# Patient Record
Sex: Female | Born: 1955 | Race: White | Hispanic: No | Marital: Married | Smoking: Former smoker
Health system: Southern US, Community
[De-identification: ages and names within clinical notes are randomized; demographics above are authoritative.]

## PROBLEM LIST (undated history)

## (undated) ENCOUNTER — Emergency Department: Discharge status: Absent without leave | Payer: Managed Care, Other (non HMO)

## (undated) DIAGNOSIS — Z9889 Other specified postprocedural states: Secondary | ICD-10-CM

## (undated) DIAGNOSIS — G43909 Migraine, unspecified, not intractable, without status migrainosus: Secondary | ICD-10-CM

## (undated) DIAGNOSIS — K759 Inflammatory liver disease, unspecified: Secondary | ICD-10-CM

## (undated) DIAGNOSIS — I2510 Atherosclerotic heart disease of native coronary artery without angina pectoris: Secondary | ICD-10-CM

## (undated) DIAGNOSIS — F329 Major depressive disorder, single episode, unspecified: Secondary | ICD-10-CM

## (undated) DIAGNOSIS — F32A Depression, unspecified: Secondary | ICD-10-CM

## (undated) DIAGNOSIS — R112 Nausea with vomiting, unspecified: Secondary | ICD-10-CM

## (undated) DIAGNOSIS — I493 Ventricular premature depolarization: Secondary | ICD-10-CM

## (undated) DIAGNOSIS — R519 Headache, unspecified: Secondary | ICD-10-CM

## (undated) DIAGNOSIS — C3490 Malignant neoplasm of unspecified part of unspecified bronchus or lung: Secondary | ICD-10-CM

## (undated) DIAGNOSIS — I471 Supraventricular tachycardia, unspecified: Secondary | ICD-10-CM

## (undated) DIAGNOSIS — J189 Pneumonia, unspecified organism: Secondary | ICD-10-CM

## (undated) DIAGNOSIS — K8020 Calculus of gallbladder without cholecystitis without obstruction: Secondary | ICD-10-CM

## (undated) DIAGNOSIS — K589 Irritable bowel syndrome without diarrhea: Secondary | ICD-10-CM

## (undated) DIAGNOSIS — M797 Fibromyalgia: Secondary | ICD-10-CM

## (undated) DIAGNOSIS — R51 Headache: Secondary | ICD-10-CM

## (undated) DIAGNOSIS — I10 Essential (primary) hypertension: Secondary | ICD-10-CM

## (undated) DIAGNOSIS — N3010 Interstitial cystitis (chronic) without hematuria: Secondary | ICD-10-CM

## (undated) DIAGNOSIS — K635 Polyp of colon: Secondary | ICD-10-CM

## (undated) DIAGNOSIS — I491 Atrial premature depolarization: Secondary | ICD-10-CM

## (undated) DIAGNOSIS — R918 Other nonspecific abnormal finding of lung field: Secondary | ICD-10-CM

## (undated) DIAGNOSIS — E039 Hypothyroidism, unspecified: Secondary | ICD-10-CM

## (undated) DIAGNOSIS — F419 Anxiety disorder, unspecified: Secondary | ICD-10-CM

## (undated) DIAGNOSIS — D649 Anemia, unspecified: Secondary | ICD-10-CM

## (undated) DIAGNOSIS — K900 Celiac disease: Secondary | ICD-10-CM

## (undated) DIAGNOSIS — Z8719 Personal history of other diseases of the digestive system: Secondary | ICD-10-CM

## (undated) DIAGNOSIS — J45909 Unspecified asthma, uncomplicated: Secondary | ICD-10-CM

## (undated) DIAGNOSIS — K219 Gastro-esophageal reflux disease without esophagitis: Secondary | ICD-10-CM

## (undated) DIAGNOSIS — E785 Hyperlipidemia, unspecified: Secondary | ICD-10-CM

## (undated) DIAGNOSIS — M1990 Unspecified osteoarthritis, unspecified site: Secondary | ICD-10-CM

## (undated) DIAGNOSIS — J449 Chronic obstructive pulmonary disease, unspecified: Secondary | ICD-10-CM

## (undated) HISTORY — DX: Calculus of gallbladder without cholecystitis without obstruction: K80.20

## (undated) HISTORY — DX: Atherosclerotic heart disease of native coronary artery without angina pectoris: I25.10

## (undated) HISTORY — DX: Supraventricular tachycardia: I47.1

## (undated) HISTORY — DX: Celiac disease: K90.0

## (undated) HISTORY — DX: Depression, unspecified: F32.A

## (undated) HISTORY — DX: Gastro-esophageal reflux disease without esophagitis: K21.9

## (undated) HISTORY — DX: Interstitial cystitis (chronic) without hematuria: N30.10

## (undated) HISTORY — DX: Polyp of colon: K63.5

## (undated) HISTORY — PX: TUBAL LIGATION: SHX77

## (undated) HISTORY — DX: Chronic obstructive pulmonary disease, unspecified: J44.9

## (undated) HISTORY — DX: Hypothyroidism, unspecified: E03.9

## (undated) HISTORY — PX: CARDIAC CATHETERIZATION: SHX172

## (undated) HISTORY — DX: Supraventricular tachycardia, unspecified: I47.10

## (undated) HISTORY — DX: Anxiety disorder, unspecified: F41.9

## (undated) HISTORY — PX: CHOLECYSTECTOMY: SHX55

## (undated) HISTORY — PX: VAGINAL HYSTERECTOMY: SUR661

## (undated) HISTORY — DX: Irritable bowel syndrome, unspecified: K58.9

## (undated) HISTORY — DX: Malignant neoplasm of unspecified part of unspecified bronchus or lung: C34.90

## (undated) HISTORY — DX: Hyperlipidemia, unspecified: E78.5

## (undated) HISTORY — DX: Atrial premature depolarization: I49.1

## (undated) HISTORY — PX: CORONARY ANGIOPLASTY WITH STENT PLACEMENT: SHX49

## (undated) HISTORY — DX: Major depressive disorder, single episode, unspecified: F32.9

## (undated) HISTORY — DX: Ventricular premature depolarization: I49.3

## (undated) HISTORY — DX: Fibromyalgia: M79.7

---

## 1997-12-31 ENCOUNTER — Ambulatory Visit (HOSPITAL_COMMUNITY): Admission: RE | Admit: 1997-12-31 | Discharge: 1997-12-31 | Payer: Self-pay | Admitting: Family Medicine

## 1997-12-31 ENCOUNTER — Encounter: Payer: Self-pay | Admitting: Family Medicine

## 1998-11-16 ENCOUNTER — Encounter: Payer: Self-pay | Admitting: Family Medicine

## 1998-11-16 ENCOUNTER — Ambulatory Visit (HOSPITAL_COMMUNITY): Admission: RE | Admit: 1998-11-16 | Discharge: 1998-11-16 | Payer: Self-pay | Admitting: Family Medicine

## 1999-04-23 ENCOUNTER — Ambulatory Visit (HOSPITAL_COMMUNITY): Admission: RE | Admit: 1999-04-23 | Discharge: 1999-04-23 | Payer: Self-pay | Admitting: Gastroenterology

## 1999-05-16 ENCOUNTER — Other Ambulatory Visit: Admission: RE | Admit: 1999-05-16 | Discharge: 1999-05-16 | Payer: Self-pay | Admitting: *Deleted

## 1999-10-28 ENCOUNTER — Ambulatory Visit (HOSPITAL_COMMUNITY): Admission: RE | Admit: 1999-10-28 | Discharge: 1999-10-28 | Payer: Self-pay | Admitting: Urology

## 1999-12-15 ENCOUNTER — Encounter: Payer: Self-pay | Admitting: Urology

## 1999-12-17 ENCOUNTER — Observation Stay (HOSPITAL_COMMUNITY): Admission: RE | Admit: 1999-12-17 | Discharge: 1999-12-18 | Payer: Self-pay | Admitting: Urology

## 2001-01-06 ENCOUNTER — Encounter: Payer: Self-pay | Admitting: Family Medicine

## 2001-01-06 ENCOUNTER — Encounter: Admission: RE | Admit: 2001-01-06 | Discharge: 2001-01-06 | Payer: Self-pay | Admitting: Family Medicine

## 2001-11-18 ENCOUNTER — Encounter: Payer: Self-pay | Admitting: General Surgery

## 2001-11-23 ENCOUNTER — Encounter (INDEPENDENT_AMBULATORY_CARE_PROVIDER_SITE_OTHER): Payer: Self-pay | Admitting: *Deleted

## 2001-11-23 ENCOUNTER — Ambulatory Visit (HOSPITAL_COMMUNITY): Admission: RE | Admit: 2001-11-23 | Discharge: 2001-11-25 | Payer: Self-pay | Admitting: General Surgery

## 2001-11-23 ENCOUNTER — Encounter: Payer: Self-pay | Admitting: General Surgery

## 2001-11-26 ENCOUNTER — Emergency Department (HOSPITAL_COMMUNITY): Admission: EM | Admit: 2001-11-26 | Discharge: 2001-11-26 | Payer: Self-pay | Admitting: Emergency Medicine

## 2002-01-06 ENCOUNTER — Ambulatory Visit (HOSPITAL_COMMUNITY): Admission: RE | Admit: 2002-01-06 | Discharge: 2002-01-06 | Payer: Self-pay

## 2002-02-17 ENCOUNTER — Ambulatory Visit: Admission: RE | Admit: 2002-02-17 | Discharge: 2002-02-17 | Payer: Self-pay

## 2003-04-05 ENCOUNTER — Other Ambulatory Visit: Admission: RE | Admit: 2003-04-05 | Discharge: 2003-04-05 | Payer: Self-pay | Admitting: Family Medicine

## 2004-06-08 ENCOUNTER — Ambulatory Visit: Payer: Self-pay | Admitting: Psychiatry

## 2004-06-08 ENCOUNTER — Inpatient Hospital Stay (HOSPITAL_COMMUNITY): Admission: RE | Admit: 2004-06-08 | Discharge: 2004-06-16 | Payer: Self-pay | Admitting: Psychiatry

## 2008-02-01 ENCOUNTER — Encounter: Admission: RE | Admit: 2008-02-01 | Discharge: 2008-02-01 | Payer: Self-pay | Admitting: Family Medicine

## 2008-02-03 ENCOUNTER — Ambulatory Visit: Payer: Self-pay | Admitting: Cardiovascular Disease

## 2008-02-03 ENCOUNTER — Encounter (INDEPENDENT_AMBULATORY_CARE_PROVIDER_SITE_OTHER): Payer: Self-pay | Admitting: Internal Medicine

## 2008-02-03 ENCOUNTER — Observation Stay (HOSPITAL_COMMUNITY): Admission: EM | Admit: 2008-02-03 | Discharge: 2008-02-04 | Payer: Self-pay | Admitting: Emergency Medicine

## 2008-02-04 ENCOUNTER — Ambulatory Visit (HOSPITAL_COMMUNITY): Admission: RE | Admit: 2008-02-04 | Discharge: 2008-02-04 | Payer: Self-pay | Admitting: Internal Medicine

## 2008-02-17 ENCOUNTER — Ambulatory Visit: Payer: Self-pay | Admitting: Cardiovascular Disease

## 2008-05-07 ENCOUNTER — Ambulatory Visit: Payer: Self-pay | Admitting: Cardiovascular Disease

## 2008-05-07 ENCOUNTER — Encounter: Payer: Self-pay | Admitting: Cardiovascular Disease

## 2008-05-07 DIAGNOSIS — R072 Precordial pain: Secondary | ICD-10-CM | POA: Insufficient documentation

## 2008-05-07 LAB — CONVERTED CEMR LAB
BUN: 16 mg/dL (ref 6–23)
Basophils Relative: 0.3 % (ref 0.0–3.0)
CO2: 28 meq/L (ref 19–32)
Chloride: 107 meq/L (ref 96–112)
Creatinine, Ser: 0.6 mg/dL (ref 0.4–1.2)
Eosinophils Absolute: 0.1 10*3/uL (ref 0.0–0.7)
Eosinophils Relative: 1.7 % (ref 0.0–5.0)
Hemoglobin: 13.5 g/dL (ref 12.0–15.0)
Lymphocytes Relative: 26.5 % (ref 12.0–46.0)
MCV: 90.7 fL (ref 78.0–100.0)
Neutro Abs: 5.5 10*3/uL (ref 1.4–7.7)
Neutrophils Relative %: 64.8 % (ref 43.0–77.0)
RBC: 4.3 M/uL (ref 3.87–5.11)
WBC: 8.5 10*3/uL (ref 4.5–10.5)

## 2008-05-10 ENCOUNTER — Ambulatory Visit: Payer: Self-pay | Admitting: Cardiovascular Disease

## 2008-05-10 ENCOUNTER — Inpatient Hospital Stay (HOSPITAL_BASED_OUTPATIENT_CLINIC_OR_DEPARTMENT_OTHER): Admission: RE | Admit: 2008-05-10 | Discharge: 2008-05-10 | Payer: Self-pay | Admitting: Cardiovascular Disease

## 2008-05-15 ENCOUNTER — Ambulatory Visit: Payer: Self-pay | Admitting: Cardiovascular Disease

## 2008-05-15 ENCOUNTER — Ambulatory Visit (HOSPITAL_COMMUNITY): Admission: RE | Admit: 2008-05-15 | Discharge: 2008-05-16 | Payer: Self-pay | Admitting: Cardiovascular Disease

## 2008-05-29 DIAGNOSIS — IMO0001 Reserved for inherently not codable concepts without codable children: Secondary | ICD-10-CM | POA: Insufficient documentation

## 2008-05-29 DIAGNOSIS — J45909 Unspecified asthma, uncomplicated: Secondary | ICD-10-CM | POA: Insufficient documentation

## 2008-05-29 DIAGNOSIS — M479 Spondylosis, unspecified: Secondary | ICD-10-CM | POA: Insufficient documentation

## 2008-05-29 DIAGNOSIS — E785 Hyperlipidemia, unspecified: Secondary | ICD-10-CM | POA: Insufficient documentation

## 2008-05-29 DIAGNOSIS — K589 Irritable bowel syndrome without diarrhea: Secondary | ICD-10-CM | POA: Insufficient documentation

## 2008-05-29 DIAGNOSIS — F3289 Other specified depressive episodes: Secondary | ICD-10-CM | POA: Insufficient documentation

## 2008-05-29 DIAGNOSIS — K219 Gastro-esophageal reflux disease without esophagitis: Secondary | ICD-10-CM | POA: Insufficient documentation

## 2008-05-29 DIAGNOSIS — F329 Major depressive disorder, single episode, unspecified: Secondary | ICD-10-CM

## 2008-06-04 ENCOUNTER — Encounter: Admission: RE | Admit: 2008-06-04 | Discharge: 2008-06-04 | Payer: Self-pay | Admitting: Family Medicine

## 2008-06-04 ENCOUNTER — Encounter: Payer: Self-pay | Admitting: Cardiovascular Disease

## 2008-06-04 ENCOUNTER — Ambulatory Visit: Payer: Self-pay | Admitting: Cardiovascular Disease

## 2008-06-04 DIAGNOSIS — I2510 Atherosclerotic heart disease of native coronary artery without angina pectoris: Secondary | ICD-10-CM | POA: Insufficient documentation

## 2008-06-07 ENCOUNTER — Encounter (HOSPITAL_COMMUNITY): Admission: RE | Admit: 2008-06-07 | Discharge: 2008-09-05 | Payer: Self-pay | Admitting: Cardiovascular Disease

## 2008-06-11 ENCOUNTER — Inpatient Hospital Stay (HOSPITAL_COMMUNITY): Admission: EM | Admit: 2008-06-11 | Discharge: 2008-06-13 | Payer: Self-pay | Admitting: Emergency Medicine

## 2008-06-11 ENCOUNTER — Ambulatory Visit: Payer: Self-pay | Admitting: Cardiology

## 2008-06-12 ENCOUNTER — Telehealth (INDEPENDENT_AMBULATORY_CARE_PROVIDER_SITE_OTHER): Payer: Self-pay | Admitting: *Deleted

## 2008-07-03 ENCOUNTER — Telehealth: Payer: Self-pay | Admitting: Cardiovascular Disease

## 2008-07-16 ENCOUNTER — Ambulatory Visit: Payer: Self-pay | Admitting: Cardiovascular Disease

## 2008-07-26 ENCOUNTER — Encounter: Payer: Self-pay | Admitting: Cardiovascular Disease

## 2008-08-03 ENCOUNTER — Telehealth: Payer: Self-pay | Admitting: Cardiovascular Disease

## 2008-08-10 ENCOUNTER — Telehealth: Payer: Self-pay | Admitting: Cardiovascular Disease

## 2008-09-20 ENCOUNTER — Encounter: Payer: Self-pay | Admitting: Cardiovascular Disease

## 2009-01-23 ENCOUNTER — Encounter: Payer: Self-pay | Admitting: Cardiology

## 2009-01-23 ENCOUNTER — Encounter: Payer: Self-pay | Admitting: Cardiovascular Disease

## 2009-02-06 ENCOUNTER — Emergency Department (HOSPITAL_COMMUNITY): Admission: EM | Admit: 2009-02-06 | Discharge: 2009-02-07 | Payer: Self-pay | Admitting: Emergency Medicine

## 2009-02-13 ENCOUNTER — Telehealth (INDEPENDENT_AMBULATORY_CARE_PROVIDER_SITE_OTHER): Payer: Self-pay | Admitting: *Deleted

## 2009-02-19 ENCOUNTER — Ambulatory Visit: Payer: Self-pay | Admitting: Cardiovascular Disease

## 2009-02-19 DIAGNOSIS — R000 Tachycardia, unspecified: Secondary | ICD-10-CM | POA: Insufficient documentation

## 2009-03-05 ENCOUNTER — Ambulatory Visit: Payer: Self-pay

## 2009-03-15 ENCOUNTER — Encounter: Payer: Self-pay | Admitting: Cardiovascular Disease

## 2009-03-18 ENCOUNTER — Telehealth: Payer: Self-pay | Admitting: Cardiovascular Disease

## 2009-03-18 ENCOUNTER — Ambulatory Visit: Payer: Self-pay | Admitting: Cardiology

## 2009-05-06 ENCOUNTER — Telehealth: Payer: Self-pay | Admitting: Cardiovascular Disease

## 2009-05-14 ENCOUNTER — Ambulatory Visit (HOSPITAL_COMMUNITY): Admission: RE | Admit: 2009-05-14 | Discharge: 2009-05-14 | Payer: Self-pay | Admitting: Gastroenterology

## 2009-05-28 ENCOUNTER — Encounter: Payer: Self-pay | Admitting: Cardiovascular Disease

## 2009-09-25 ENCOUNTER — Encounter: Payer: Self-pay | Admitting: Cardiovascular Disease

## 2009-09-27 ENCOUNTER — Telehealth (INDEPENDENT_AMBULATORY_CARE_PROVIDER_SITE_OTHER): Payer: Self-pay | Admitting: *Deleted

## 2010-01-21 ENCOUNTER — Encounter: Payer: Self-pay | Admitting: Cardiovascular Disease

## 2010-01-21 ENCOUNTER — Ambulatory Visit: Payer: Self-pay | Admitting: Cardiovascular Disease

## 2010-04-01 NOTE — Procedures (Signed)
Summary: summary report  summary report   Imported By: Parks Neptune 03/20/2009 11:30:33  _____________________________________________________________________  External Attachment:    Type:   Image     Comment:   External Document

## 2010-04-01 NOTE — Progress Notes (Signed)
Summary: rx sent to [facility] today..pt aware  Phone Note Outgoing Call Call back at pt   Summary of Call: CMA s/w pt to advised rx's sent to [facility] today and to allow 24-48 hours for fax to be rcv'd before order is processed.. Pt gave verbal understanding. Julaine Hua, CMA  May 06, 2009 4:21 PM

## 2010-04-01 NOTE — Assessment & Plan Note (Signed)
Summary: rov   Visit Type:  rov Primary Provider:  Dr. Agustina Caroli, [place]  CC:  pt c/o leg pain in the morning....  History of Present Illness: 55 yo WF with history of asthma, depression, fibromyalgia, GERD and  CAD who was initially seen in our office 02/17/08 with complaints of chest pain.  Nuclear test showed no ischemia. She continued to have chest pain after discharge so she was sent for a left heart cath on 05/10/08 which showed a 80% stenosis in the mid RCA. Dr. Burt Knack performed her PCI on 05/15/08 and placed a 2.5 x 15 mm Xience drug eluting stent in the mid RCA. She was in cardiac rehab on April 13th and had sinus tachycardia with questionable SVT. She was admitted to to [facility] and underwent cardiac cath with showed patent stent in the RCA and non obstructive disease otherwise. Testing in hospital ruled out MI, thyroid disease and was otherwise normal. Holter in January 2011 with NSR, PACs and PVCs. Earlier this year, she had abnormal LFTs and had a liver biopsy. She was told by Dr. Thana Farr that it was normal. She has been seen in our lipid clinic in january 2011 and has been on therapy excluding statins as she has not tolerated well in the past. Her most recent lipid profile in July 2011 showed totatl cholesterol over 300 and LDL over 200.   She has been feeling ok. No chest pain. Occasionally feels fatigued. She has noticed swollen veins in her legs.   New Orders:     1)  EKG w/ Interpretation (93000)   Current Medications (verified): 1)  Wellbutrin Xl 300 Mg Xr24h-Tab (Bupropion Hcl) .Marland Kitchen.. 1 Tab Qd 2)  Welchol 3.75 Gm Pack (Colesevelam Hcl) .... Dissolve 1 Pack in Water Daily. 3)  Lovaza 1 Gm Caps (Omega-3-Acid Ethyl Esters) .... 2 Tab Bid 4)  Vitamin D 50000 Unit Caps (Ergocalciferol) .... Twice A Week 5)  Protonix 40 Mg Tbec (Pantoprazole Sodium) .Marland Kitchen.. 1 Tab Qd 6)  Aspirin Ec 325 Mg Tbec (Aspirin) .... Take One Tablet By Mouth Daily 7)   Albuterol Sulfate (2.5 Mg/69m) 0.083%  Nebu (Albuterol Sulfate) .... Four Times Daily or Every 6 Hours As Needed 8)  Lorazepam 2 Mg Tabs (Lorazepam) .... Take As Directed Prn 9)  Vitamin D 1000 Unit Tabs (Cholecalciferol) .... Take 1 Tablet By Mouth Once A Day 10)  Antara 130 Mg Caps (Fenofibrate Micronized) .... Take 1 Capsule By Mouth Once A Day 11)  Nitroglycerin 0.4 Mg Subl (Nitroglycerin) .... One Tablet Under Tongue Every 5 Minutes As Needed For Chest Pain---May Repeat Times Three 12)  Flonase 50 Mcg/act Susp (Fluticasone Propionate) .... As Needed 13)  Ventolin Hfa 108 (90 Base) Mcg/act Aers (Albuterol Sulfate) .... As Needed 14)  Plavix 75 Mg Tabs (Clopidogrel Bisulfate) .... One Tablet Once Daily 15)  Metoprolol Tartrate 25 Mg Tabs (Metoprolol Tartrate) .... Take One Tablet By Mouth Twice A Day  Allergies: 1)  ! Cipro 2)  ! Codeine  Past History:  Past Medical History: Reviewed history from 07/16/2008 and no changes required. Asthma Depression Fibromyalgia GERD Irritable bowel syndrome Lumbar degenerative joint disease Hyperlipidemia CAD-s/p Xience drug eluting stent mid RCA 05/15/08. Cath 06/12/08 with patent stent and nonobstructive disease.  Social History: Reviewed history from 05/07/2008 and no changes required. 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  The patient denies malaise, fever, weight gain/loss, vision loss, decreased hearing, hoarseness, chest pain, palpitations, shortness of breath, prolonged cough, wheezing, sleep apnea, coughing up blood, abdominal pain, blood in stool, nausea, vomiting, diarrhea, heartburn, incontinence, blood in urine, muscle weakness, joint pain, leg swelling, rash, skin lesions, headache, fainting, dizziness, depression, anxiety, enlarged lymph nodes, easy bruising or bleeding, and environmental  allergies.    Vital Signs:  Patient profile:   55 year old female Height:      67 inches Weight:      166.8 pounds BMI:     26.22 Pulse rate:   102 / minute Pulse rhythm:   irregular BP sitting:   122 / 80  (left arm) Cuff size:   large  Vitals Entered By: Julaine Hua, CMA (January 21, 2010 10:55 AM)  Physical Exam  General:  General: Well developed, well nourished, NAD Musculoskeletal: Muscle strength 5/5 all ext Psychiatric: Mood and affect normal Neck: No JVD, no carotid bruits, no thyromegaly, no lymphadenopathy. Lungs:Clear bilaterally, no wheezes, rhonci, crackles CV: RRR no murmurs, gallops rubs Abdomen: soft, NT, ND, BS present Extremities: No edema, pulses 2+.    EKG  Procedure date:  01/21/2010  Findings:      Sinus tach, rate 102 bpm.   Impression & Recommendations:  Problem # 1:  CAD, NATIVE VESSEL (ICD-414.01)  Stable. No changes.  Continue current meds. Stop Plavix as it has been 20 months since her stent was placed.   I have given her the name of one of our vein specialists in town for evaluation of her varicose veins.   The following medications were removed from the medication list:    Plavix 75 Mg Tabs (Clopidogrel bisulfate) ..... One tablet once daily Her updated medication list for this problem includes:    Aspirin Ec 325 Mg Tbec (Aspirin) .Marland Kitchen... Take one tablet by mouth daily    Nitroglycerin 0.4 Mg Subl (Nitroglycerin) ..... One tablet under tongue every 5 minutes as needed for chest pain---may repeat times three    Metoprolol Tartrate 25 Mg Tabs (Metoprolol tartrate) .Marland Kitchen... Take one tablet by mouth twice a day  Orders: EKG w/ Interpretation (93000)  Problem # 2:  HYPERLIPIDEMIA-MIXED (ICD-272.4) Poor control. She has been seen in the lipid clinic. Will schedule follow up in lipid clinic.  Goal LDL 70 with CAD. Has not tolerated statins in past.   Her updated medication list for this problem includes:    Welchol 3.75 Gm Pack  (Colesevelam hcl) .Marland Kitchen... Dissolve 1 pack in water daily.    Lovaza 1 Gm Caps (Omega-3-acid ethyl esters) .Marland Kitchen... 2 tab bid    Antara 130 Mg Caps (Fenofibrate micronized) .Marland Kitchen... Take 1 capsule by mouth once a day  Patient Instructions: 1)  Your physician recommends that you schedule a follow-up appointment in: 1 year. 2)  Your physician has recommended you make the following change in your medication: STOP taking PLAVIX.

## 2010-04-01 NOTE — Progress Notes (Signed)
Summary: stop plavix  Phone Note From Other Clinic   Caller: [facility] Summary of Call: pt to have liver biopsy on the 15th. needs to stop plavix 7 days prior. ofc [phone] fax [phone] Initial call taken by: Lorraine Lax,  May 06, 2009 11:01 AM  Follow-up for Phone Call        Dr Angelena Form on vac. all week will discuss w/DOD Kevan Rosebush, RN  May 06, 2009 11:11 AM   Dr Angelena Form addressed Plavix for liver biopsy already in his 02/19/09 ov note, note faxed via [facility] fax  Kevan Rosebush, RN  May 06, 2009 11:13 AM

## 2010-04-01 NOTE — Assessment & Plan Note (Signed)
Summary: new lipid   [name] is seen in lipid clinic today.  She spends part of her time in [name], [place], where her daughter was relocated.  She keeps her 2 grandchildren during 3 weeks off (year-round program) and for long weekends.  She didn't complete cardiac rehab due to her travel.    [name] has a history of elevated LFTs.  Dr. Earlean Shawl is following, and plans liver biopsy in March after 12 months of plavix has been completed for Xience stent placed by M. Burt Knack, MD in 3/10 into mid-RCA.   Lipitor:  elevated LFTs Crestor:  flu-like illness Zocor:  maybe elevated LFTs Tricor:    Other medical history: fibromyalgias which complicate treatment options per patient report.    Lipid Management Provider  Alinda Deem, PharmD, BCPS, CPP  Allergies (verified): 1)  ! Cipro 2)  ! Codeine  Past History:  Past Medical History: Last updated: 07/16/2008 Asthma Depression Fibromyalgia GERD Irritable bowel syndrome Lumbar degenerative joint disease Hyperlipidemia CAD-s/p Xience drug eluting stent mid RCA 05/15/08. Cath 06/12/08 with patent stent and nonobstructive disease.  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 Mother-died at age 58 COPD Father died age 68 lung cancer Remote relatives with CAD  Social History: Reviewed history from May 22, 2008 and no changes required. 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 Dissolve 1 pack in water daily.    Lovaza 1 Gm Caps (Omega-3-acid ethyl esters) .Marland Kitchen... 2 tab bid    Antara 130 Mg Caps (Fenofibrate micronized) .Marland Kitchen... Take 1 capsule by mouth once a day Prescriptions: WELCHOL 3.75 GM PACK (COLESEVELAM HCL) Dissolve 1 pack in water daily.  #90 x 3   Entered by:   Alinda Deem PharmD, BCPS, CPP   Authorized by:   Colin Mulders, MD, [facility]   Signed by:   Alinda Deem PharmD, BCPS, CPP on 03/18/2009   Method used:   Electronically to        [facility] (mail-order)             ,          Ph: [phone]       Fax: [phone]   RxID:   [id number] WELCHOL 3.75 GM PACK (COLESEVELAM HCL) Dissolve 1 pack in water daily.  #30 x 1   Entered by:   Alinda Deem PharmD, BCPS, CPP   Authorized by:   Colin Mulders, MD, [facility]   Signed by:   Alinda Deem PharmD, BCPS, CPP on 03/18/2009   Method used:   Electronically to        [facility] [address] [phone]* (retail)       [address]  [place], [place]  [address]       Ph: [phone] or [phone]       Fax: [phone]   RxID:   [id number]

## 2010-04-01 NOTE — Progress Notes (Signed)
  Phone Note From Other Clinic   Caller: Medical Records Summary of Call: A release form from [facility] was faxed over. I sent the requested records

## 2010-04-01 NOTE — Progress Notes (Signed)
Summary: requests call re: holter monitor finding***LMVM***/al  Phone Note From Other Clinic   Caller: patient  Call For: seen in clinic--monitor follow-up Summary of Call: Requests additional call re: findings of holter monitor completed in 01/2009.  Didn't understand all statements in the previous call.  Thanks. Initial call taken by: Alinda Deem PharmD, BCPS, CPP,  March 18, 2009 3:54 PM  Follow-up for Phone Call        [place], RN, BSN  March 18, 2009 4:04 PM  Reviewed monoter results with pt. She verbalizes understanding Follow-up by: Thompson Grayer, RN, BSN,  March 25, 2009 2:41 PM

## 2010-04-01 NOTE — Letter (Signed)
Summary: [facility]   Imported By: Roddie Mc 06/07/2009 16:12:51  _____________________________________________________________________  External Attachment:    Type:   Image     Comment:   External Document

## 2010-05-23 LAB — CBC
HCT: 40.1 % (ref 36.0–46.0)
Hemoglobin: 14 g/dL (ref 12.0–15.0)
MCHC: 34.8 g/dL (ref 30.0–36.0)
RDW: 13 % (ref 11.5–15.5)

## 2010-06-03 LAB — URINALYSIS, ROUTINE W REFLEX MICROSCOPIC
Glucose, UA: NEGATIVE mg/dL
Leukocytes, UA: NEGATIVE
Nitrite: NEGATIVE
Protein, ur: NEGATIVE mg/dL
Urobilinogen, UA: 0.2 mg/dL (ref 0.0–1.0)

## 2010-06-03 LAB — CBC
HCT: 40 % (ref 36.0–46.0)
Hemoglobin: 13.9 g/dL (ref 12.0–15.0)
MCHC: 34.7 g/dL (ref 30.0–36.0)
MCV: 90.2 fL (ref 78.0–100.0)
RDW: 13.6 % (ref 11.5–15.5)

## 2010-06-03 LAB — COMPREHENSIVE METABOLIC PANEL
Alkaline Phosphatase: 90 U/L (ref 39–117)
BUN: 12 mg/dL (ref 6–23)
Calcium: 9.7 mg/dL (ref 8.4–10.5)
Glucose, Bld: 113 mg/dL — ABNORMAL HIGH (ref 70–99)
Total Protein: 6.9 g/dL (ref 6.0–8.3)

## 2010-06-03 LAB — URINE MICROSCOPIC-ADD ON

## 2010-06-03 LAB — DIFFERENTIAL
Basophils Relative: 0 % (ref 0–1)
Monocytes Relative: 7 % (ref 3–12)
Neutro Abs: 4.9 10*3/uL (ref 1.7–7.7)
Neutrophils Relative %: 63 % (ref 43–77)

## 2010-06-03 LAB — URINE CULTURE

## 2010-06-11 LAB — RAPID URINE DRUG SCREEN, HOSP PERFORMED
Cocaine: NOT DETECTED
Opiates: NOT DETECTED
Tetrahydrocannabinol: NOT DETECTED

## 2010-06-11 LAB — DIFFERENTIAL
Basophils Absolute: 0 10*3/uL (ref 0.0–0.1)
Basophils Relative: 0 % (ref 0–1)
Lymphocytes Relative: 20 % (ref 12–46)
Neutro Abs: 5.5 10*3/uL (ref 1.7–7.7)

## 2010-06-11 LAB — COMPREHENSIVE METABOLIC PANEL
Alkaline Phosphatase: 82 U/L (ref 39–117)
BUN: 14 mg/dL (ref 6–23)
CO2: 22 mEq/L (ref 19–32)
Chloride: 107 mEq/L (ref 96–112)
Creatinine, Ser: 0.69 mg/dL (ref 0.4–1.2)
GFR calc non Af Amer: >60 mL/min (ref >60)
Glucose, Bld: 100 mg/dL — ABNORMAL HIGH (ref 70–99)
Total Bilirubin: 0.7 mg/dL (ref 0.3–1.2)

## 2010-06-11 LAB — CBC
HCT: 40.3 % (ref 36.0–46.0)
Hemoglobin: 14 g/dL (ref 12.0–15.0)
MCV: 90.1 fL (ref 78.0–100.0)
MCV: 90.2 fL (ref 78.0–100.0)
RBC: 4.25 MIL/uL (ref 3.87–5.11)
RBC: 4.47 MIL/uL (ref 3.87–5.11)
WBC: 6.8 10*3/uL (ref 4.0–10.5)
WBC: 7.8 10*3/uL (ref 4.0–10.5)

## 2010-06-11 LAB — CARDIAC PANEL(CRET KIN+CKTOT+MB+TROPI)
CK, MB: 2.5 ng/mL (ref 0.3–4.0)
Relative Index: INVALID (ref 0.0–2.5)
Total CK: 105 U/L (ref 7–177)
Troponin I: <0.01 ng/mL (ref 0.00–0.06)

## 2010-06-11 LAB — D-DIMER, QUANTITATIVE: D-Dimer, Quant: 0.58 ug/mL-FEU — ABNORMAL HIGH (ref 0.00–0.48)

## 2010-06-11 LAB — CK TOTAL AND CKMB (NOT AT ARMC)
Relative Index: INVALID (ref 0.0–2.5)
Total CK: 72 U/L (ref 7–177)

## 2010-06-11 LAB — PROTIME-INR
INR: 1 (ref 0.00–1.49)
Prothrombin Time: 13.1 seconds (ref 11.6–15.2)

## 2010-06-11 LAB — HEPARIN LEVEL (UNFRACTIONATED): Heparin Unfractionated: 0.68 IU/mL (ref 0.30–0.70)

## 2010-06-12 LAB — BASIC METABOLIC PANEL
BUN: 13 mg/dL (ref 6–23)
BUN: 18 mg/dL (ref 6–23)
Chloride: 107 mEq/L (ref 96–112)
GFR calc non Af Amer: >60 mL/min (ref >60)
Glucose, Bld: 74 mg/dL (ref 70–99)
Potassium: 3.9 mEq/L (ref 3.5–5.1)
Potassium: 3.9 mEq/L (ref 3.5–5.1)
Sodium: 141 mEq/L (ref 135–145)

## 2010-06-12 LAB — CBC
HCT: 36.8 % (ref 36.0–46.0)
MCHC: 35.6 g/dL (ref 30.0–36.0)
MCV: 89.3 fL (ref 78.0–100.0)
Platelets: 199 10*3/uL (ref 150–400)
Platelets: 225 10*3/uL (ref 150–400)
RDW: 12.9 % (ref 11.5–15.5)
RDW: 13.3 % (ref 11.5–15.5)

## 2010-06-12 LAB — CARDIAC PANEL(CRET KIN+CKTOT+MB+TROPI)
CK, MB: 8.1 ng/mL — ABNORMAL HIGH (ref 0.3–4.0)
Total CK: 78 U/L (ref 7–177)
Troponin I: 0.29 ng/mL — ABNORMAL HIGH (ref 0.00–0.06)

## 2010-07-03 ENCOUNTER — Other Ambulatory Visit: Payer: Self-pay | Admitting: Surgery

## 2010-07-15 NOTE — Discharge Summary (Signed)
[name], [name]                 ACCOUNT NO.:  [id number]   MEDICAL RECORD NO.:  [id number]          PATIENT TYPE:  INP   LOCATION:  5885                         FACILITY:  [facility]   PHYSICIAN:  Lauree Chandler, MDDATE OF BIRTH:  [birth year]   DATE OF ADMISSION:  06/11/2008  DATE OF DISCHARGE:  06/13/2008                               DISCHARGE SUMMARY   PRIMARY CARDIOLOGIST:  Lauree Chandler, MD   PRIMARY CARE PHYSICIAN:  Dr. Joaquim Lai.   PROCEDURES PERFORMED DURING HOSPITALIZATION:  Cardiac catheterization  completed by Dr. Minus Breeding on June 12, 2008, revealing left main  normal, left anterior descending proximal luminal irregularities, mid  diagonal moderate patent, circumflex atrioventricular groove normal, mid-  obtuse marginal branching normal, right coronary artery large dominant,  proximal long 25%, mid stent widely patent, posterior descending artery  large normal, posterolateral moderate normal, nonobstructive coronary  artery disease likely approximately moderately patent right coronary  artery stent.   FINAL DISCHARGE DIAGNOSES:  1. Sinus tachycardia.  2. Coronary artery disease, status post drug-eluting stent to the mid      right coronary artery secondary to 80% stenosis in March 2010.  3. Asthma  4. Gastroesophageal reflux disease.  5. Fibromyalgia.  6. Depression.   HOSPITAL COURSE:  This is a 55 year old Caucasian female with above-  mentioned diagnoses, who was in her first day of Cardiac Rehab when she  had SVT and sinus tachycardia with heart rate of 120 beats per minute  with associated burning across her chest and pressure.  She was brought  to the emergency room from [place] where she was evaluated and  given IV Lopressor x1 and started on higher dose of metoprolol.  The  patient was admitted to rule out ischemic etiology for sinus  tachycardia.  The patient's TSH was also checked to evaluate for  abnormalities.   TSH was found to be  negative at 1.05.  The patient had a D-dimer, which  was found to be mildly elevated at 0.58.  A CT scan was negative for PE.  The patient also had a urine drug screen, which was positive for  amphetamines and the patient admitted to take Adderall p.r.n. at home.   The patient was seen and examined by Dr. Lauree Chandler on June 13, 2008, and found to be stable.  The patient had no recurrence of  chest pain, shortness of breath, or palpitations.  There was no runs of  SVT on higher dose of Lopressor.  The patient will follow up in the  office with Dr. Lauree Chandler on current medication regimen with  changes in the Lopressor dose from 25 mg b.i.d. to 75 mg b.i.d.  She has  also been advised not to take Adderall.   DISCHARGE LABORATORIES:  Cardiac enzymes were found to be negative x3  with a troponin of less than 0.01,  Hemoglobin 13.5, hematocrit 38.3,  white blood cells 6.8, and platelets 212.  TSH 1.092.  Sodium 139,  potassium 4.0, chloride 107, CO2 of 22, glucose 100, BUN 14, and  creatinine 0.69.  Bili total 0.7, alkaline  phosphatase 82, SGOT 20, SGPT  19, total protein 7.0, and calcium 9.2.  D-dimer 0.58.  CT scan of the  chest dated June 12, 2008, revealed no evidence of acute pulmonary  embolism, no thoracic aortic abnormality, 2 small patchy opacities in  the superior segment of the left lower lobe may represent scarring, but  early foci of pneumonia cannot be excluded.  Chest x-ray on June 11, 2008, revealing no active disease.  EKG on discharge revealing normal  sinus rhythm with a ventricular rate of 66 beats per minute without  evidence of ischemia.   VITAL SIGNS ON DISCHARGE:  Blood pressure 90/56, pulse 71, respirations  12, O2 sat 99% on room air, and temperature 97.2.   DISCHARGE MEDICATIONS:  1. Lopressor 75 mg twice a day (new dose increased from 25 b.i.d.).  2. Aspirin 325 daily.  3. Plavix 75 mg daily.  4. Wellbutrin 300 mg daily.  5. Lovaza 1  g twice a day.  6. Protonix 40 mg daily.  7. Avapro 300 mg daily.  8. WelChol 625 mg 3 times a day.  9. Albuterol inhaler 1 puff as needed.  10.Nitroglycerin 0.4 mg sublingual p.r.n. chest discomfort.  11.Vitamin D daily.  12.Antara 130 mg daily.  13.Flomax 1 puff p.r.n.  14.Darvocet 100/650 p.r.n. pain.   ALLERGIES:  CIPRO causing hives and CODEINE causing nausea.   FOLLOWUP PLANS AND APPOINTMENT:  1. The patient will follow up with Dr. Lauree Chandler in his      office in 2-3 weeks.  2. The patient will follow up with primary care physician on her own      accord.  She will make that appointment.  3. The patient has been given post cardiac catheterization      instructions with particular emphasis on the right groin site for      evidence of bleeding, hematoma, or infection.  4. The patient has been advised not to take Adderall in the setting of      recurrent SVT.  5. The patient has been advised to bring medications to follow up      appointments.   TIME SPENT WITH THE PATIENT TO INCLUDE PHYSICIAN TIME:  35 minutes.     Phill Myron. Purcell Nails, NP      Lauree Chandler, MD  Electronically Signed   KML/MEDQ  D:  06/13/2008  T:  06/13/2008  Job:  [id number]   cc:   Dr. Joaquim Lai

## 2010-07-15 NOTE — Discharge Summary (Signed)
[name], [name]                 ACCOUNT NO.:  [id number]   MEDICAL RECORD NO.:  [id number]          PATIENT TYPE:  OBV   LOCATION:  1422                         [facility]   PHYSICIAN:  Jimmey Ralph, MD  DATE OF BIRTH:  1955-04-10   DATE OF ADMISSION:  02/03/2008  DATE OF DISCHARGE:                               DISCHARGE SUMMARY   DISCHARGE DIAGNOSES:  1. Atypical chest pain to rule out myocardial infarction.  2. Chronic obstructive pulmonary disease.  3. Depression.  4. History of suicidal ideation.  5. Fibromyalgia.  6. Gastroesophageal reflux disease.  7. Irritable bowel syndrome.  8. Lumbar degenerative joint disease.  9. Hyperlipidemia.  10.History of transient ischemic attack.   DISCHARGE MEDICATIONS:  1. Aspirin 81 mg p.o. daily.  2. Wellbutrin 300 mg p.o. daily.  3. Colesevelam (Welchol) 625 mg p.o. b.i.d.  4. Fenofibrate 145 mg p.o. daily.  5. Lopressor 25 mg p.o. q.12 hours.  6. Lovaza 1 gram p.o. b.i.d.  7. Protonix 40 mg p.o. q.12 hours.  8. Dialysate one tablet p.o. b.i.d.   HOSPITAL COURSE:  The patient is a 55 year old Caucasian lady patient  who came in and was admitted on December 4 for complaints of chest pain,  squeezing type, 6/10 in intensity on left lower sternal region radiating  to the left shoulder lasting for 1-3 minutes which later on felt like as  if it was stabbing in nature.  The patient had 3 sets of cardiac enzymes  done, one of the cardiac enzymes was borderline high at 0.09.  Cardiology evaluation was done.  The patient underwent a stress test for  the same.  As per the cardiology evaluation and repeat cardiac enzymes  were negative, stress test done which was normal with an EF of 59% and  no ischemia or scarring.  The patient was stable to be discharged and  can be discharged home.  Radiological investigation done during the stay  in the hospital; chest x-ray done on February 03, 2008.  Impression COPD  in upper lobes, no  active cardiopulmonary disease.  CT of the thoracic  spine February 03, 2008.  Impression no acute findings to explain left  upper extremity symptoms, emphysema.  Stress test done on February 04, 2008, normal exam with EF of 59%.  No evidence of ischemia or scarring.   DISCHARGE LABS:  Total WBC 7.4, hemoglobin 13, hematocrit 37.8, platelet  count 327, sodium 144, potassium 4.2, chloride 108, bicarb 28, glucose  102, BUN 12, creatinine 0.7, calcium 9.3.  Three sets of cardiac enzymes  have  been negative.  Total cholesterol has been 267, triglycerides 685, HDL  26, LDL unable to be calculated, as triglycerides more than 400.   PLAN:  To discharge the patient home on medications as dictated above.  The patient was advised to quit smoking.      Jimmey Ralph, MD  Electronically Signed     MP/MEDQ  D:  02/04/2008  T:  02/04/2008  Job:  [id number]

## 2010-07-15 NOTE — Cardiovascular Report (Signed)
NAMES[place]YA, [name]                 ACCOUNT NO.:  [id number]   MEDICAL RECORD NO.:  [id number]          PATIENT TYPE:  INP   LOCATION:  7903                         FACILITY:  [facility]   PHYSICIAN:  Minus Breeding, MD, FACCDATE OF BIRTH:  1955/05/22   DATE OF PROCEDURE:  06/12/2008  DATE OF DISCHARGE:                            CARDIAC CATHETERIZATION   PROCEDURE:  Coronary angiography.   INDICATIONS:  Evaluate patient with chest pain and recent stenting of a  right coronary lesion.   PROCEDURE NOTE:  Left heart catheterization was performed via the left  femoral artery.  The vessel was cannulated using anterior wall puncture.  I did attempt through the right femoral approach but was unable to  cannulate the vessel.  Left coronary and right coronary Judkins  catheters were used.  The patient tolerated the procedure well and left  lab in stable condition.   RESULTS:  Hemodynamics:  AO 117/66.  Coronaries:  Left main was normal.  The LAD had proximal luminal irregularities.  There was mid diagonal  which was moderate sized and patent.  The circumflex in the AV groove  was normal.  Mid obtuse marginal was branching and normal.  The right  coronary artery was a large dominant vessel.  It had proximal long 25%  stenosis.  There was a mid stent which was widely patent.  PDA was large  and normal.  Posterolateral was moderate sized and normal.  LV:  The LV  was not injected.  I did not cross her pressures.   CONCLUSION:  Nonobstructive coronary artery disease.  Widely patent  right coronary artery stent.   PLAN:  The patient will have a CT scan to rule out pulmonary embolism.  She will continue with aggressive risk reduction.      Minus Breeding, MD, [facility]  Electronically Signed     JH/MEDQ  D:  06/12/2008  T:  06/13/2008  Job:  [id number]   cc:   Agustina Caroli, MD

## 2010-07-15 NOTE — H&P (Signed)
[name], [name]                 ACCOUNT NO.:  [id number]   MEDICAL RECORD NO.:  [id number]          PATIENT TYPE:  OBV   LOCATION:  1422                         FACILITY:  [facility]   PHYSICIAN:  Lauree Chandler, MDDATE OF BIRTH:  [birth year]   DATE OF ADMISSION:  02/03/2008  DATE OF DISCHARGE:                              HISTORY & PHYSICAL   CHIEF COMPLAINT:  Chest pain.   HISTORY OF PRESENT ILLNESS:  The patient has noted an increasing dyspnea  over the last several months, but no paroxysmal nocturnal dyspnea or  orthopnea or edema.  Two months ago, the patient noted a more severe  chest pressure or pain.  It was squeezing.  It was 6/10 in severity and  the pain was located in the low left sternal region.  It radiates to the  left shoulder.  It lasted between 1 and 3 minutes.  It came on after  vacuuming and mopping the floor, resolved with rest, but pain moved to  the hands.  The pain in the hands was 10/10, it felt like a stabbing.  The patient felt short of breath during the event and for 1 to 2 hours  afterwards.  The same thing occurred last night, but the pain only went  to the left hand.  No other significant associated symptoms.  No nausea,  vomiting, and dizziness.  She felt warm, but no diaphoresis.  No  palpitations.   PAST MEDICAL HISTORY:  1. Atypical chest pain for which she had a stress test 11 years ago.  2. Asthma.  3. Depression, history of suicidal ideation.  4. Fibromyalgia.  5. Gastroesophageal reflux disorder.  6. Irritable bowel syndrome.  7. Lumbar degenerative joint disease.  8. Hyperlipidemia.  9. Possible TIA.  However, she had carotid study that was normal.   SURGICAL HISTORY:  She is status post hysterectomy.  She is status post  cholecystectomy.   SOCIAL HISTORY:  The patient [place] in [place], [place]  to [place].  She lives with her husband and her son.  She is not  working at this time.  However, she is active doing  chores around the  house.  She has remote tobacco history of approximately 20-pack years.  She never drinks alcohol and she has no illicit drug use.  Her diet is  only different from regular because she eats very little meat.  No  regular exercise.   FAMILY HISTORY:  Mother deceased at age 73 from emphysema.  Father  deceased at age 42 from lung cancer.  Siblings, brother died at 71 from  drug use.  No other known half-siblings and unknown cardiac disease  within her siblings.   REVIEW OF SYSTEMS:  The patient has chronic off and on sweats both at  night and in the day, but no change in the last several months.  She has  had increasing dyspnea on exertion as mentioned in the HPI over the last  couple of months.  She has a non productive cough times last 3 weeks.  She has chronic depression and anxiety and  for the last several months,  she has had more tingling in hands and feet.  Also, she has pain in her  knees, legs, and hands which is chronic.  She has chronic diarrhea.  Her  GERD symptoms have been worse over the last several months, but all  other systems reviewed were negative.   ALLERGIES:  The patient is allergic to CIPROFLOXACIN.   MEDICATIONS:  1. Lovaza 2 g b.i.d.  2. Antara 30 mg by mouth daily.  3. WelChol 625 by mouth b.i.d.  4. Darvocet 180 mg by mouth b.i.d.  5. Lorazepam 2 mg by mouth q.6 h. p.r.n. for anxiety.  6. Wellbutrin 300 by mouth daily.  7. Protonix 40 mg by mouth daily.  8. Adderall 20 mg by mouth p.r.n.  9. Albuterol MDI p.r.n.  In the Emergency Department today, she was      given aspirin 325 mg.   PHYSICAL EXAMINATION:  VITAL SIGNS:  98.0 degrees Fahrenheit, pulse was  78, respiration rate was 16, blood pressure was 128/82.  However, when  she first came in, it was 133/102, which the patient says is much higher  than she has ever had before.  GENERAL:  She is alert and oriented x3, in no apparent distress.  HEENT:  Normocephalic and atraumatic.   Pupils are equal, round, and  reactive to light.  Extraocular muscles are intact.  NECK:  Supple with only mild lymphadenopathy and mild tenderness to her  cervical lymph nodes.  The patient had no thyromegaly, no bruits, and no  JVD.  HEART:  Regular rate and rhythm with an audible S1, S2 without murmurs,  rubs, or gallops.  Normal PMI.  Pulses are 2+ and equal without bruits  in the upper and lower extremities.  LUNGS:  Clear to auscultation bilaterally with no audible wheezing.  SKIN:  No rashes, lesions, or petechiae.  ABDOMEN:  Soft and nontender.  She had tenderness in all four quadrants,  but it was mild.  She had mild hepatomegaly.  She has minimal  enlargement, but there were no tenderness.  EXTREMITIES:  No clubbing, cyanosis, or edema.  MUSCULOSKELETAL:  There was no joint swelling or tenderness, no CVA  tenderness as well.  NEURO:  Cranial nerves II through XII were grossly intact.  Strength was  5/5 in all extremities.  Axial groups, normal sensation throughout.  Normal cerebellar function.   She had a chest x-ray of one view, which showed COPD especially in the  upper lobes, however, no active cardiopulmonary disease.   She had a normal EKG.  Normal sinus rate and rhythm.   LABORATORY DATA:  White blood cell 7.4, hemoglobin 13.0, hematocrit  37.8, platelet count was 227, sodium was 144, potassium was 4.2,  chloride was 108, CO2 was 28, BUN was 12, creatinine was 0.75, glucose  was 102, calcium was 9.3.  First set of cardiac enzymes was negative, CK-  MB was 1.0, troponin I was less than 0.05.   ASSESSMENT AND PLAN:  The patient has chest pain that is atypical with  typical features.   PLAN:  Plan for her is to cycle her cardiac enzymes.  We are going to  admit her to tele.  She will be planned to have an adenosine stress test  in the a.m.  If that stress test is negative, she will follow up with  the appointment that has already been scheduled with our office.  If  it  is positive, we will plan for  a left heart catheterization with possible  PCI.      Guss Bunde, [facility]      Lauree Chandler, MD  Electronically Signed    MS/MEDQ  D:  02/03/2008  T:  02/04/2008  Job:  [id number]

## 2010-07-15 NOTE — H&P (Signed)
[name], [name]                 ACCOUNT NO.:  [id number]   MEDICAL RECORD NO.:  [id number]          PATIENT TYPE:  INP   LOCATION:  3735                         FACILITY:  [facility]   PHYSICIAN:  Bruce R. Olevia Perches, MD, FACCDATE OF BIRTH:  03-25-1955   DATE OF ADMISSION:  06/11/2008  DATE OF DISCHARGE:                              HISTORY & PHYSICAL   PRIMARY CARDIOLOGIST:  Lauree Chandler, MD   PRIMARY CARE PHYSICIAN:  Dr. Joaquim Lai.   REASON FOR ADMISSION:  SVT with chest pain.   HISTORY OF PRESENT ILLNESS:  A 55 year old Caucasian female with known  history of CAD status post drug-eluting stent to the right coronary  artery on May 09, 2008 who was in her first day of cardiac rehab when  she had SVT during cool down with a heart rate of 120 beats per minute  with associated burning across her chest and pressure.  The patient was  given sublingual nitroglycerin with some relief.  She complained of  chest pressure and shortness of breath also over the weekend while she  was chasing a new puppy they have.  The patient was seen by Dr. Angelena Form  on June 04, 2008 and expressed complaints of chest pain but no changes  were made in her medications.  It was noted that during recent  admission, the patient did have a brief episode of SVT in the hospital  which was asymptomatic.  The patient was given reassurance during a  visit to Dr. Camillia Herter office and was to followup.  The patient was  placed on metoprolol 25 mg b.i.d. on discharge and the patient states  she has been taking her medications without skipping doses.  The patient  is currently resting in the ER with a heart rate of 110 and a blood  pressure of 124/82.   REVIEW OF SYSTEMS:  Positive for dizziness, chest pain, shortness of  breath, dyspnea on exertion, and burning in her chest.  All other  systems were reviewed and found to be negative.   PAST MEDICAL HISTORY:  CAD status post cardiac catheterization completed  in March  2010 revealing an 80% lesion in her right coronary artery  midsection.  The patient had a Xience 2.5 x 15 mm drug-eluting stent  placed.  The patient also has a history of asthma, depression,  fibromyalgia, hyperlipidemia, IBS, and GERD.   PAST SURGICAL HISTORY:  Hysterectomy, cholecystectomy, and bladder  surgery.   SOCIAL HISTORY:  She lives in [place] with her husband.  She is a  20 pack-year smoker.  Negative for EtOH or drug use.   FAMILY HISTORY:  Mother deceased from complications of emphysema.  Father deceased from lung cancer.  Brother deceased from drug use.   CURRENT MEDICATIONS:  1. Lopressor 25 mg b.i.d.  2. Aspirin 325 daily.  3. Plavix 75 mg daily.  4. Wellbutrin 300 mg daily.  5. Lovaza 1 gram b.i.d.  6. Darvocet p.r.n.  7. Vitamin D weekly.  8. Protonix 40 mg daily.  9. Avapro 300 mg daily.  10.Lorazepam 2 mg p.r.n.  11.Albuterol q.i.d. p.r.n.  ALLERGIES:  CIPRO, CODEINE, and intolerance to STATINS secondary to  elevated liver enzymes.   LABORATORY DATA:  Current labs are pending.  EKG reveals sinus  tachycardia, ventricular rate of 119 beats per minute.   PHYSICAL EXAMINATION:  VITAL SIGNS:  Blood pressure 122/82, heart rate  113-120, temperature 98.1, and O2 sat 100% on 2 L.  HEENT:  Head is normocephalic and atraumatic.  EYES:  PERRLA.  ENT:  Mucous membranes:  Mouth pink and moist.  Tongue is midline.  NECK:  Supple.  No JVD.  No carotid bruits.  CARDIOVASCULAR:  Tachycardic without murmurs, rubs, or gallops.  Pulses  are 2+ and equal without bruits.  LUNGS:  Clear to auscultation without wheezes, rales, or rhonchi.  ABDOMEN:  Soft, nontender, 2+ bowel sounds without rebound or guarding.  EXTREMITIES:  Without clubbing, cyanosis or edema.  NEUROLOGIC:  Cranial nerves II-XII are grossly intact.   IMPRESSION:  1. Sinus tachycardia.  2. Coronary artery disease status post drug-eluting stent to the mid      right coronary artery secondary to 80%  stenosis in March 2010.  3. Asthma.  4. Gastroesophageal reflux disease.  5. Fibromyalgia.  6. Depression.   PLAN:  This is a 55 year old Caucasian female with known history of CAD  who was recently discharged in March 2010 after having had a drug-  eluting stent to the mid right coronary artery who was in her first day  of cardiac rehab when she had an episode of SVT during cool down with a  heart rate of 120 beats per minute and associated chest burning and  pressure.   The patient was seen and examined by myself and Dr. Eustace Quail after  being brought to the emergency room secondary to the symptoms.  She was  given nitroglycerin and has had some symptom relief.  The patient will  be admitted for observation.  We will increase her beta blocker to 50 mg  p.o. q.8 h. after giving one dose of IV Lopressor in the emergency room.  We will check a TSH.  Return her to the medication she was on previously  to admission with the exception of the Avapro which we will hold  secondary to a low blood pressure at this time.  The  patient will also have a TSH drawn.  Urine drug screen is also going to  be completed.  The patient may or may not be scheduled for cardiac  catheterization in the a.m.  Dr. Olevia Perches will be discussing this with Dr.  Angelena Form and more recommendations will be made.      Phill Myron. Purcell Nails, NP      Sheridan Olevia Perches, MD, [facility]  Electronically Signed    KML/MEDQ  D:  06/11/2008  T:  06/12/2008  Job:  [id number]   cc:   Dr. Joaquim Lai

## 2010-07-15 NOTE — Assessment & Plan Note (Signed)
[name]                            CARDIOLOGY OFFICE NOTE   Diana, [name]                        MRN:          [id number]  DATE:02/17/2008                            DOB:          September 07, 1955    REASON FOR VISIT:  Hospital followup after an admission for chest pain.   HISTORY OF PRESENT ILLNESS:  [name] is a pleasant 55 year old  Caucasian female with past medical history significant for asthma,  depression, fibromyalgia, gastroesophageal reflux disease, irritable  bowel syndrome, and hyperlipidemia who comes in today for a hospital  followup.  The patient was admitted to [facility] on February 03, 2008, after an episode of chest discomfort.  She ruled out for a  myocardial infarction with serial cardiac enzymes and had a nuclear  stress test that showed no evidence of ischemia.  Her resting  echocardiogram during the hospitalization also showed normal left  ventricular function with no significant abnormalities.  There were no  regional wall motion abnormalities noted.  The patient has done well  since her time of discharge and only notes one other episode of chest  discomfort while vacuuming.  This episode of discomfort was different in  nature from the chest pain that occurred prior to her admission to the  hospital.  She tells me today that she had been doing well, but seems to  have contracted an upper respiratory infection.  She denies any fever,  chills, but does endorse mild production of clear sputum.  She otherwise  has had no episodes of near syncope, syncope.  She denies any orthopnea  or lower extremity edema.   PAST MEDICAL HISTORY:  1. Asthma.  2. Depression.  3. Fibromyalgia.  4. Gastroesophageal reflux disease.  5. Atypical chest pain.  6. Irritable bowel syndrome.  7. Lumbar degenerative joint disease.  8. Hyperlipidemia.   PAST SURGICAL HISTORY:  1. Hysterectomy.  2. Cholecystectomy.  3. Bladder  surgery.   ALLERGIES:  CIPROFLOXACIN.   CURRENT MEDICATIONS:  1. Wellbutrin 300 mg once daily.  2. WelChol 625 mg twice daily.  3. Lovaza 1 g twice daily.  4. Darvocet twice daily.  5. Vitamin D 50,000 international units once weekly.  6. Protonix 40 mg once daily.  7. Avapro 300 mg once daily.  8. Enteric-coated aspirin 325 mg once daily.  9. Albuterol inhaler p.r.n.   SOCIAL HISTORY:  The patient lives in [place] with her husband and  her son.  She has four children.  She smoked a half-a-pack of cigarettes  per day for the last 30 years.  She is not currently smoking.  She  denies the use of alcohol or drugs.  Her total pack years of tobacco use  is approximately 20 pack years.  She does not get regular exercise.  She  is currently a housewife and recently received her Bachelors degree in  [place].   FAMILY HISTORY:  The patient's mother died at age 17 from emphysema and  her father died at age 52 from lung cancer.  Her brother died at age  38  from drug use.  She has no family history of premature coronary artery  disease.   REVIEW OF SYSTEMS:  As stated in history of present illness is otherwise  negative.   PHYSICAL EXAMINATION:  VITAL SIGNS:  Blood pressure 100/60, pulse 83,  and regular, respirations 12 and unlabored.  GENERAL:  She is a pleasant young Caucasian female in no acute distress.  She is alert and oriented x3.  SKIN:  Warm and dry.  HEENT:  Normal.  NECK:  No JVD.  No carotid bruits.  No thyromegaly.  No lymphadenopathy.  LUNGS:  There are scattered wheezes with decreased air movement  throughout the bilateral lung fields.  CARDIOVASCULAR:  Regular rate and rhythm without murmurs, gallops, or  rubs noted.  ABDOMEN:  Soft, nontender.  Bowel sounds are present.  EXTREMITIES:  No evidence of edema.  Pulses are 2+ in all extremities.   DIAGNOSTIC STUDIES:  1. Transthoracic echocardiogram obtained on February 03, 2008, shows       normal left ventricular systolic function with ejection fraction of      60%.  There were no left ventricular regional wall motions noted.      There was a small pericardial effusion anterior to the heart, which      most likely represented a prominent epicardial fat pad.  There were      no significant valvular abnormalities noted.  2. Adenosine myocardial perfusion study performed on February 04, 2008,      showed no evidence of myocardial ischemia.  Ejection fraction is      estimated at 59%.  3. A 12-lead EKG obtained in our office today shows normal sinus      rhythm with a ventricular rate of 83 beats per minute in a      rightward axis.   ASSESSMENT AND PLAN:  This is a pleasant 55 year old Caucasian female  whose risk factors for coronary artery disease include hyperlipidemia  and tobacco abuse who presents today for hospital followup after an  admission for chest pain.  The patient had a completely normal  myocardial perfusion study and a normal echocardiogram.  Her  electrocardiogram is not suggestive of any significant abnormalities.  At this time, I do not think any further cardiac workup is necessary.  The patient's chest pain is most likely noncardiac in etiology.  I have  encouraged her to continue to follow with her primary care physician to  look for other causes of chest pain.  I have also told the patient that  if in the future she continues to have exertional chest pain and no  other causes are found, we could elect to perform a diagnostic left  heart catheterization to ultimately rule out any underlying coronary  artery disease.  She is aware that she should call our office if she  continues to have chest pain that cannot be explained by any other  causes.     Lauree Chandler, MD  Electronically Signed    CM/MedQ  DD: 02/17/2008  DT: 02/18/2008  Job #: [id number]

## 2010-07-15 NOTE — Cardiovascular Report (Signed)
[name], [name]                 ACCOUNT NO.:  [id number]   MEDICAL RECORD NO.:  [id number]          PATIENT TYPE:  OIB   LOCATION:  1961                         FACILITY:  [facility]   PHYSICIAN:  Lauree Chandler, MDDATE OF BIRTH:  12-25-1955   DATE OF PROCEDURE:  05/10/2008  DATE OF DISCHARGE:  05/10/2008                            CARDIAC CATHETERIZATION   PRIMARY CARDIOLOGIST:  Lauree Chandler, MD   PROCEDURE PERFORMED:  1. Left heart catheterization.  2. Selective coronary angiography.  3. Left ventricular angiogram.   OPERATOR:  Lauree Chandler, MD   INDICATIONS:  Chest pain in a 55 year old Caucasian female with a past  medical history significant for hyperlipidemia and a strong family  history of premature coronary artery disease.   DETAILS OF PROCEDURE:  The patient was brought to the outpatient cardiac  catheterization laboratory after signing informed consent for the  procedure.  The right groin was prepped and draped in a sterile fashion.  Lidocaine 1% was used for local anesthesia.  A 4-French sheath was  inserted into the right femoral artery without difficulty.  Standard  diagnostic catheters were used to perform selective coronary  angiography.  A 4-French pigtail catheter was used to perform a left  ventricular angiogram in the RAO projection.  There was no significant  gradient across the aortic valve during pullback of the catheter.  The  patient tolerated the procedure well and was taken to the holding area  in stable condition.   ANGIOGRAPHIC FINDINGS:  1. The left main coronary artery had no evidence of obstructive      disease.  2. The left anterior descending coronary artery had a 20% stenosis in      the proximal portion and no other disease through the remainder of      the vessel.  The first diagonal is a moderate-sized vessel without      any significant disease.  The second diagonal is a small-caliber      vessel without significant  disease.  3. The circumflex artery has a small first and second obtuse marginal      branch and a moderate-sized third obtuse marginal branch that is a      bifurcating vessel.  There is no evidence of disease in this      system.  4. The right coronary artery is a dominant vessel that has a proximal      30% stenosis and a mid 80% stenosis.  5. Left ventricular angiogram was performed in the RAO projection      which showed normal systolic function with no wall motion      abnormalities.  Ejection fraction was 60%.   HEMODYNAMIC DATA:  Central aortic pressure 121/77.  Left ventricle  pressure 122/15.  End-diastolic pressure 17.   IMPRESSION:  1. Single-vessel coronary artery disease.  2. Normal left ventricular systolic function.   RECOMMENDATIONS:  We will load the patient with Plavix 300 mg a day.  I  will start her on aspirin, daily Plavix, and daily Lopressor.  We will  schedule her to come back to the inpatient cardiac  catheterization  laboratory next week to have percutaneous coronary intervention of the  mid right coronary artery stenosis.      Lauree Chandler, MD  Electronically Signed     CM/MEDQ  D:  05/10/2008  T:  05/11/2008  Job:  [id number]

## 2010-07-15 NOTE — Discharge Summary (Signed)
NAMELYNA, LANINGHAM                 ACCOUNT NO.:  [id number]   MEDICAL RECORD NO.:  [id number]          PATIENT TYPE:  INP   LOCATION:  2502                         FACILITY:  [facility]   PHYSICIAN:  Juanda Bond. Burt Knack, MD  DATE OF BIRTH:  12-24-1955   DATE OF ADMISSION:  05/15/2008  DATE OF DISCHARGE:  05/16/2008                               DISCHARGE SUMMARY   ADDENDUM   An addendum to previous dictated discharge summary, that job number was  [id number].  Please refer to that dictation for complete details.  Prior to  discharge, Ms. Rockhold had a very brief run of symptomatic SVT that broke  spontaneously.  She reported tachy palpitations.  We note that she had  been on beta-blocker previously, and this was not started during this  admission.  We therefore have reinitiated her Lopressor 25 mg b.i.d.  She is to report any additional tachy palpitations to [place], and at that  point, we could consider an outpatient monitoring if felt to be  necessary.  The plan is, otherwise, being discharged home today in good  condition.      Murray Hodgkins, ANP      Juanda Bond. Burt Knack, MD  Electronically Signed    CB/MEDQ  D:  05/16/2008  T:  05/16/2008  Job:  [id number]

## 2010-07-15 NOTE — Discharge Summary (Signed)
NAMECALLEIGH, LAFONTANT                 ACCOUNT NO.:  [id number]   MEDICAL RECORD NO.:  [id number]          PATIENT TYPE:  INP   LOCATION:  2502                         FACILITY:  [facility]   PHYSICIAN:  Juanda Bond. Burt Knack, MD  DATE OF BIRTH:  [birth year]   DATE OF ADMISSION:  05/15/2008  DATE OF DISCHARGE:  05/16/2008                               DISCHARGE SUMMARY   PRIMARY CARDIOLOGIST:  Lauree Chandler, MD   PRIMARY CARE Rael Yo:  Agustina Caroli, MD   DISCHARGE DIAGNOSIS:  Unstable angina.   SECONDARY DIAGNOSES:  1. Coronary artery disease, status post successful percutaneous      coronary intervention and stenting of the mid right coronary artery      with placement of 2.5 x 15 mm XIENCE V drug-eluting stent.  2. Asthma.  3. Depression.  4. Fibromyalgia.  5. Hyperlipidemia.  6. Irritable bowel syndrome.  7. Gastroesophageal reflux disease.  8. Lumbar degenerative joint disease.  9. Status post hysterectomy.  10.Status post cholecystectomy.  11.History of bladder surgery.   ALLERGIES:  CIPRO and CODEINE.  She is also intolerant to LIPITOR and  CRESTOR secondary to elevated LFTs and myalgias.   HISTORY OF PRESENT ILLNESS:  A 55 year old Caucasian female with the  above problem list.  She recently was evaluated by Dr. Angelena Form in  clinic on May 04, 2008, with complaints of chest discomfort.  She  underwent left heart cardiac catheterization on May 10, 2008,  revealing 80% stenosis in right coronary artery but otherwise no  obstructive disease, normal LV function.  The patient represented to the  cath lab on May 15, 2008, for PCI.   HOSPITAL COURSE:  The patient underwent successful PCI and stenting of  the mid right coronary artery with placement of 2.5 x 15 mm XIENCE V  drug-eluting stent.  She tolerated the procedure well and postprocedure  has been ambulating without limitations.  She has noted a fleeting  pinching sensation in her left chest.  Her ECG has remained  baseline  and in fact has been negative.  Plan to discharge her home this  afternoon.   DISCHARGE LABORATORY DATA:  Hemoglobin 12.2, hematocrit 34.4, WBC 7.3,  and platelets 199.  Sodium 141, potassium 3.9, chloride 111, CO2 of 27,  BUN 13, creatinine of 0.71, glucose 110, and calcium 8.7.   DISPOSITION:  The patient will be discharged home today in good  condition.   FOLLOWUP PLAN AND APPOINTMENTS:  We have arranged for follow up with Dr.  Angelena Form on June 04, 2008, at 9:45 a.m.  She is to follow up with Dr.  Agustina Caroli as scheduled.   DISCHARGE MEDICATIONS:  1. Aspirin 325 mg daily.  2. Plavix 75 mg daily.  3. Wellbutrin XL 300 mg daily.  4. WelChol 625 mg b.i.d.  5. Lovaza 1 g b.i.d.  6. Darvocet A500 b.i.d.  7. Vitamin D weekly.  8. Protonix 40 mg daily.  9. Avapro 300 mg daily.  10.Lorazepam 2 mg daily p.r.n.  11.Adderall 20 mg as previously prescribed.  12.Albuterol MDI 1 to 2 puffs q.i.d. p.r.n.  13.Nitroglycerin  0.4 mg sublingual p.r.n. chest pain.   OUTSTANDING LABORATORY STUDIES:  None.   DURATION OF DISCHARGE/ENCOUNTER:  Fifty minutes including physician  time.      Murray Hodgkins, ANP      Juanda Bond. Burt Knack, MD  Electronically Signed    CB/MEDQ  D:  05/16/2008  T:  05/17/2008  Job:  [id number]   cc:   Agustina Caroli, MD

## 2010-07-15 NOTE — H&P (Signed)
[name], [name]                 ACCOUNT NO.:  [id number]   MEDICAL RECORD NO.:  [id number]          PATIENT TYPE:  EMS   LOCATION:  ED                           FACILITY[facility]  [facility]   PHYSICIAN:  Leana Gamer, MDDATE OF BIRTH:  03-24-1955   DATE OF ADMISSION:  02/03/2008  DATE OF DISCHARGE:                              HISTORY & PHYSICAL   CHIEF COMPLAINT:  Chest pain.   HISTORY OF PRESENT ILLNESS:  This is a 55 year old lady with a history  of hyperlipidemia and tobacco use disorder who is also postmenopausal.  The patient presents to the emergency room with 1-week history of pain  in her left chest wall and radiating down the left arm.  The patient  describes 2 types of pain.  She describes a pressure which is in the  upper left chest wall which she states is essentially constant and has  some variation in intensity but she is unable to identify palliative or  provocative features.  She states that that pain is approximately about  a 4/10.  The patient also describes a sharp pain which has occurred  intermittently and also has some radiation down her left arm and  occasionally down both of her arms.  The patient has had no associated  diaphoresis but she does complain of some shortness of breath associated  with the pain radiating down her arm.  Please note that the patient is  very anxious regarding her cardiac status as she had a CT of the abdomen  done in the outpatient setting which reported that she may have had some  right ventricular dysfunction.  I am unclear as to how that conclusion  was reached on a CT of the abdomen.  However, based upon this report,  the patient has a lot of anxiety surrounding this.  The patient states  that she was scheduled to go to [facility] for a 2D  echocardiogram but came to the emergency room as the pain has persisted.   She has had no fever or chills.  She has had no nausea, vomiting, or  diarrhea.  She has had no associated  dizziness, loss of consciousness.   PAST MEDICAL HISTORY:  Significant for:  1. Asthma.  2. Hyperlipidemia.  3. Fibromyalgia.  4. Chronic fatigue syndrome.  5. Depression.  6. Vitamin D deficiency.  7. Total abdominal hysterectomy.   SOCIAL HISTORY:  The patient is a reformed tobacco user since Jul 03, 2007.  She smoked approximately a half a pack per day up until then.  The patient has been tobacco free since Jul 31, 2007.  She denies any  alcohol or drug use.  Currently is not employed.   CURRENT MEDICATIONS:  Include the following:  1. Lovaza 16 mg p.o. b.i.d.  2. Antara 130 mg p.o. daily.  3. Welchol 625 mg p.o. b.i.d.  4. Darvocet-N 100 one tab p.o. b.i.d.  5. Vitamin D 50 international units p.o. weekly on Tuesdays.  6. Ativan 2 mg p.o. every 6 hours p.r.n.  7. Wellbutrin 300 mg p.o. daily.  8. Protonix 40  mg p.o. daily.  9. Adderall 20 mg p.r.n.  10.Albuterol MDI 2 puffs p.o. every 4 hours p.r.n.   PRIMARY CARE PHYSICIAN:  Dr.  Morene Antu Mazocchi.   REVIEW OF SYSTEMS:  Fourteen systems were reviewed.  All systems were  negative except as noted in the HPI.   STUDIES DONE IN THE EMERGENCY ROOM:  Showed the following:  EKG shows a  normal sinus rhythm.   LABS:  Show a white blood cell count of 7.4, hemoglobin of 30,  hematocrit of 37.8, platelet count of 227, sodium 144, potassium 4.2,  chloride 108, bicarb 28, BUN 12, creatinine 0.75.  Point of care enzymes  show CK-MB at 1.0 and troponin of less than 0.05.   PHYSICAL EXAMINATION:  The patient is laying in bed in no acute distress  whatsoever.  Her temperature is 98.0.  Blood pressure 128/82.  Heart  rate 78.  Respiratory rate 16.  O2 sats are 98% on room air.  HEENT EXAMINATION:  She is normocephalic, atraumatic.  Pupils are  equally round and reactive to light and accommodation.  Extraocular  movements are intact.  Tympanic membranes are translucent bilaterally  with good landmarks.  Oropharynx is moist.  No  exudate, erythema, or  lesions noted.  NECK EXAMINATION:  Trachea is midline.  No masses.  No thyromegaly.  No  JVD.  No carotid bruit.  RESPIRATORY EXAMINATION:  Patient has a normal respiratory effort.  Equal excursion bilaterally.  No wheezing or rhonchi noted on  examination.  CARDIOVASCULAR:  She has got a normal S1 and S2.  No murmurs, rubs, or  gallops are noted.  PMI is nondisplaced.  No heaves or thrills on  palpation.  ABDOMINAL EXAMINATION:  Abdomen is obese, soft, nontender, nondistended.  No masses.  No hepatosplenomegaly.  LYMPH NODE SURVEY:  She has got no cervical, axillary, or inguinal  lymphadenopathy.  MUSCULOSKELETAL:  The patient has tenderness down the cervical spinal  region and she has got paraspinal tenderness throughout the cervical and  thoracic region.  Patient also has tenderness in the strap muscles of  the bilateral upper quadrants including the rhomboids and the trapezius,  both upper and lower and levator scapular.  NEUROLOGICAL:  She has got no focal neurological deficits.  Cranial  nerves II-XII are grossly intact.  DTRs are 2+ bilaterally, upper and  lower extremities.  Sensation is intact to light touch and  proprioception.  PSYCHIATRIC:  This is a lady who is a little bit anxious.  She is alert  and oriented x3.  She has got good insight and cognition.  Recent and  remote memory are intact.   ASSESSMENT AND PLAN:  This is a patient who presents with very atypical  chest pain.  I believe that the chest pain is likely mostly  musculoskeletal in nature and the pain in her upper extremities may be  resultant from some degenerative joint disease in the cervical spinal  region.  Although patient does have a pressure in her chest and does  have multiple risk factors which puts her at moderate risk.  I will  bring the patient in and rule her out with serial enzymes.  I have  already contacted [facility] for a possible stress test tomorrow  if  the serial enzymes are negative.  We will resume her usual  medications.   She will have best practice of DVT and GI prophylaxis.  I will also get  a CT of her cervical spine to  evaluate for any nerve impingement.      Leana Gamer, MD  Electronically Signed     MAM/MEDQ  D:  02/03/2008  T:  02/03/2008  Job:  [id number]

## 2010-07-18 NOTE — Op Note (Signed)
[facility]  Patient:    [name], [name]                        MRN: [id number] Proc. Date: 12/17/99 Adm. Date:  [id number] Attending:  Deeann Cree                           Operative Report  PREOPERATIVE DIAGNOSIS:  Cystocele.  POSTOPERATIVE DIAGNOSIS:  Cystocele.  PROCEDURE:  Anterior repair.  SURGEON:  Mark C. Karsten Ro, M.D.  ASSISTANT:  Lucina Mellow. Terance Hart, M.D.  ANESTHESIA:  General.  DRAINS:  None.  SPECIMENS:  None.  BLOOD LOSS:  Less than 20 cc.  COMPLICATIONS:  None.  INDICATIONS:  The patient is a 55 year old white female with urinary incontinence.  She has a prior vaginal hysterectomy and anterior repair in the past.  She was found to have what appeared to be a grade 1 cystocele in the office and no rectocele.  She underwent urodynamics that confirmed the need for pubovaginal sling and was also found on cystogram to have bladder that was lying low on the baseline film and drops well below the symphysis pubis with straining, confirming the presence of her cystocele.  The risks and complications as well as alternatives were explained to the patient, and she understands and wishes to proceed.  DESCRIPTION OF PROCEDURE:  After informed consent, the patient was placed on the operating table and administered general anesthesia.  She was moved in the dorsal lithotomy position, and her genitalia was sterilely prepped and draped. A 16 French Foley catheter was inserted in the bladder, and the bladder was drained.  A weighted speculum was then placed in the vagina, and 1% Marcaine with epinephrine was then used to infiltrate the subvaginal mucosa in the midline.  After allowing adequate time for epinephrine effect, a midline incision was made in the anterior vaginal mucosa, and Allis clamps were placed on the edges of the incision.  Initially sharp dissection was used to dissect the vaginal mucosa from the underlying tissues, and then  blunt dissection was used to carry this further laterally.  The patient was found to have what appears to be a grade 1 to grade 2 cystocele while under anesthesia.  This was performed on both sides identically.  After proceeding with this, Dr. Terance Hart performed pubovaginal sling placement.  After the sling was in place, the procedure which will be dictated by him separately, I proceeded with the anterior repair.  Sutures of 2-0 Vicryl were used to reapproximate the lateral fascial edges in the midline in an interrupted fashion.  This completely obliterated her cystocele.  I then excised the redundant vaginal mucosa and reapproximated the vaginal mucosal edges with a running 2-0 Vicryl suture.  The patient tolerated the procedure well, and there were no intraoperative complications.  Sponge, needle and instrument counts were reportedly correct x 2 at the end of the operation.  Dr. Terance Hart proceeded with the remainder of his anterior repair, again, which will be dictated separately, and the vaginal packing was placed within the vagina at the end of the operation.  She received preoperative antibiotics and will be observed overnight. DD:  12/17/99 TD:  12/17/99 Job: [id number]

## 2010-07-18 NOTE — Op Note (Signed)
[facility]  Patient:    [name], [name]                        MRN: [id number] Proc. Date: 12/17/99 Adm. Date:  [id number] Attending:  Deeann Cree                           Operative Report  PREOPERATIVE DIAGNOSIS:  Stress incontinence and cystocele.  POSTOPERATIVE DIAGNOSIS:  Stress incontinence and cystocele.  PROCEDURE:  Pubovaginal sling.  SURGEON:  Lucina Mellow. Terance Hart, M.D.  ASSISTANT:  Thana Farr. Karsten Ro, M.D.  ANESTHESIA:  General.  INDICATIONS:  This lady has had a cystocele and stress incontinence and is brought to the OR today for repair.  Anterior repair was performed by Dr. Karsten Ro and dictated separately.  DESCRIPTION OF PROCEDURE:  Patient brought to the operating room and placed in the lithotomy position.  She was prepped and draped in the usual fashion.  Dr. Karsten Ro in a separate dictation dissected the vaginal mucosa off the bladder for subsequent anterior repair.  I then dissected up toward the endopelvic fascia on each side.  Suprapubic incisions were made on each side of the midline and dissected down to fascia.  A donor fascial graft which was folded in half so it was about 1.5-2 cm wide and 8 cm in length had been soaked in antibiotic solution and #1 Prolene sutures placed on each end.  I then used the Strully scissors to perforate the endopelvic fascia bilaterally and dissect up behind the pubic rami on each side.  I then used the Raz needle passer and perforated the rectus fascia through each of the suprapubic incisions, came down in the corresponding endopelvic space and periurethral space to pull up the Prolene sutures on each side.  With the Foley catheter balloon in position, I sutured the graft in position just beyond the bladder neck and then further distally in the urethra to keep in line nice and flat. The graft was pulled up in good position.  Dr. Karsten Ro then performed the anterior repair and closed the vaginal  mucosa.  I then cystoscoped her, and there was no evidence of bladder injury or suture material.  On pulling up on the Prolene sutures on each side, they were right at the bladder neck and quite distal to the ureteral orifices on each side.  With the bladder full, I made a stab wound in the midline and placed a suprapubic tube in good position using a Microvasive catheter.  A full coil was noted in the bladder where the stick came through the air bubble.  The suprapubic tube was later connected to closed drainage after suturing the suprapubic tube in place with a black silk suture.  The Prolene sutures were tied down at skin level on each side, and the wounds were irrigated with triple antibiotic solution and the suprapubic wounds were closed with skin staples.  A vaginal pack impregnated with Neosporin ointment was then placed vaginally.  Suprapubic wounds were cleaned and dressed in the usual fashion, and she was taken to recovery in good condition.  Estimated blood loss 200 cc.  She tolerated the procedure well. Sponge, needle, and instrument counts correct. DD:  12/17/99 TD:  12/17/99 Job: [id number]

## 2010-07-18 NOTE — Op Note (Signed)
NAME:  [name], [name]                           ACCOUNT NO.:  [id number]   MEDICAL RECORD NO.:  [id number]                   PATIENT TYPE:  OIB   LOCATION:  NA                                   FACILITY:  [facility]   PHYSICIAN:  Marland Kitchen T. Hoxworth, M.D.          DATE OF BIRTH:  1955/10/28   DATE OF PROCEDURE:  11/23/2001  DATE OF DISCHARGE:                                 OPERATIVE REPORT   PREOPERATIVE DIAGNOSIS:  Symptomatic cholelithiasis.   POSTOPERATIVE DIAGNOSIS:  Symptomatic cholelithiasis.   SURGICAL PROCEDURE:  Laparoscopic cholecystectomy with intraoperative  cholangiogram.   SURGEON:  Marland Kitchen T. Hoxworth, M.D.   ASSISTANT:  Judeth Horn, M.D.   ANESTHESIA:  General.   BRIEF HISTORY:  This patient is a 55 year old white female with repeated  episodes of epigastric and right upper quadrant abdominal pain radiating to  her back and continued low-grade nausea.  She had not responded to proton  pump inhibitors, and ultrasound was obtained revealing contracted  gallbladder with stones with a borderline enlarged common bile ducts.  LFTs  have been normal.  Laparoscopic cholecystectomy with cholangiogram has been  recommended and accepted.  The nature of the procedure, indications, risks  of bleeding, infection, and bile duct injury were discussed and understood  preoperatively.  The patient was brought to the operating room for this  procedure.   DESCRIPTION OF PROCEDURE:  The patient was brought to the operating room and  placed in the supine position on the operating table, and general  endotracheal anesthesia was induced.  She received preoperative antibiotics.  PS were in place.  The abdomen was sterilely prepped and draped.  Local  anesthesia was used to infiltrate the trocar sites prior to the incision.  A  1 cm incision was made at the umbilicus.  Dissection was carried down to the  midline fascia which was sharply incised for 1 cm and the peritoneum entered  under  direct vision.  Through a mattress suture of 0 Vicryl, the Hasson  trocar was placed and pneumoperitoneum established.  Under direct vision, a  10 mm trocar was placed in the subxiphoid area and two 5 mm trocars on the  right subcostal margin.  The gallbladder was visualized and did not appear  acutely inflamed.  The fundus was grasped and elevated up over the liver and  the infundibulum retracted inferolaterally.  __________ was stripped off the  neck of the gallbladder toward the porta hepatitis.  The peritoneum was  incised, anterior and posterior, in the psoas triangle.  The distal  gallbladder was thoroughly dissected, and the cystic artery and cystic duct  were identified and the cystic duct gallbladder circumferentially dissected  360 degrees.  When the anatomy was clear, the cystic duct was clipped at the  gallbladder junction.  An operative cholangiogram was obtained through the  cystic duct.  This showed good filling of a borderline enlarged common bile  duct and intrahepatic ducts with free flow into the duodenum and no filling  defects.  There was smooth tapering of the distal duct.  The cholangiocath  was then removed and the cystic duct doubly clipped proximally and divided.  Anterior and posterior branches of the cystic artery coursing up by the  gallbladder wall were divided between two proximal and one distal clip.  The  gallbladder was then dissected free from its bed using hook cautery and  removed intact through the umbilicus.  Good hemostasis was obtained in the  gallbladder bed and the right upper quadrant thoroughly irrigated and  suctioned until clear.  The trocars were removed under direct vision, and  all CO2 was evacuated from the peritoneal cavity. The mattress suture was  secured  at the umbilicus.  Skin incisions were closed with interrupted subcuticular  4-0 Monocryl and Steri-Strips.  Sponge, needle, and instrument counts were  correct.  A dry sterile  dressing was applied, and the patient was taken to  the recovery room in good condition.                                               Darene Lamer. Hoxworth, M.D.    [place]  D:  11/23/2001  T:  11/24/2001  Job:  [id number]

## 2010-07-18 NOTE — Discharge Summary (Signed)
[name], [name]                 ACCOUNT NO.:  [id number]   MEDICAL RECORD NO.:  [id number]          PATIENT TYPE:  IPS   LOCATION:  0303                          FACILITY:  [facility]   PHYSICIAN:  Carlton Adam, M.D.      DATE OF BIRTH:  March 15, 1955   DATE OF ADMISSION:  06/08/2004  DATE OF DISCHARGE:  06/16/2004                                 DISCHARGE SUMMARY   CHIEF COMPLAINT AND PRESENT ILLNESS:  This was the first admission to [facility] for this 55 year old married white female.  She noted  the husband was having an affair.  Became depressed with suicidal ideation,  wanting to pull the car in front of 18-wheeler.  Some thoughts of hurting  the husband but said that she would not act upon these thoughts.  History of  depression for many years.  Has been in remission until these events.   PAST PSYCHIATRIC HISTORY:  Inpatient at [facility] in [place].  Followed by Dr. Amalia Greenhouse, outpatient.  Started seeing Dr. Randye Lobo.   ALCOHOL/DRUG HISTORY:  Denies the use or abuse of any substances.   PAST MEDICAL HISTORY:  Fibromyalgia, cystitis, status post cholecystectomy,  irritable bowel syndrome, gastroesophageal reflux, urinary tract infection,  asthma, status post hysterectomy.   MEDICATIONS:  Wellbutrin 450 mg in the morning, Ativan, Levaquin 250 mg  daily.   PHYSICAL EXAMINATION:  Performed and failed to show any acute findings.   LABORATORY DATA:  CBC within normal limits.  Blood chemistries with  potassium 3.3, glucose 96, SGOT 53, SGPT 59.  TSH 1.932.  Drug screening  positive for propoxyphene.   MENTAL STATUS EXAM:  Alert, cooperative female.  Good eye contact.  Appropriate affect.  Calm, cooperative.  Speech clear, even tone, normal  rate, tempo and production.  Mood depressed and tearful.  Thought processes  logical, coherent and relevant.  Endorsed suicidal ideation.  Endorsed  homicidal ideation.  No psychosis.  Cognition was  well-preserved.   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   AXIS II:  No diagnosis.   AXIS III:  1.  Fibromyalgia.  2.  Cystitis.  3.  Irritable bowel syndrome.  4.  Gastroesophageal reflux.  5.  Asthma.   AXIS IV:  Moderate.   AXIS V:  Global Assessment of Functioning upon admission 35; highest Global  Assessment of Functioning in the last year 28-70.   HOSPITAL COURSE:  She was admitted and started in individual and group  psychotherapy.  She was maintained on her medication, placed on Wellbutrin  XL 300 mg daily, Ativan 1 mg every six hours as needed, Levaquin 250 mg for  three days, Ambien 10 mg at night for sleep, albuterol inhaler 2 puffs every  six hours as needed, Darvocet-N 100 mg, 1-2 every six hours as needed,  Phenergan 25 mg p.o. or IM every six hours as needed, __________ atropine  0.5 mg 1 daily as needed for diarrhea.  She was given trazodone 50 mg as  needed for sleep.  Trazodone was discontinued and she was placed on Remeron  15 mg per day.  She endorsed that she got very upset after she found out the  husband was having this relationship over the Internet.  She feels he  cheated on her.  They have been together 25 years.  Endorsed anger, lack of  trust, feeling overwhelmed with all that is going on.  Endorsed that she has  been dealing with depression for years but this episode has made it worse.  Endorsed also a lot of worrying and anxiety.  Initially, did not hear from  the husband. She was concerned about what he was wanting to do.  He did not  want to answer her questions as he felt that going over the events was like  beating a dead horse but she felt that she needed to know to give some  closure.  Endorsed that she was going to have trust issues with the husband.  There was a family session with the husband.  She wanted the husband to cut  off all contact with this lady.  He was  ambivalent as far as ending the  relationship.  This created more anxiety and stirred some more feelings on  her.  Afterwards, she became even more upset as she was able to process the  fact that he was not willing to end the relationship.  Endorsed that she  started crying and had a hard time stopping.  The husband was not  communicating with her and that created more of a sense of uncertainty.  Continued to evidence tearfulness. We continued to work with the Remeron at  night.  Having a difficult time with sleep due to thinking and worrying.  On  April 16th, she was able to talk with the husband.  She did sleep better.  On April 17th, she was still anxious about going home, yet she was going to  be able to give it a try to work on the relationship.  If things were not to  get better, she was ready to consider walking out of the relationship.  Endorsed no suicidal or homicidal ideation.  She felt that she was strong  enough to deal with the conflict before her.   DISCHARGE DIAGNOSES:   AXIS I:  1.  Major depression, recurrent.  2.  Anxiety disorder not otherwise specified.   AXIS II:  No diagnosis.   AXIS III:  1.  Fibromyalgia.  2.  Cystitis.  3.  Irritable bowel syndrome.  4.  Gastroesophageal reflux.  5.  Asthma.   AXIS IV:  Moderate.   AXIS V:  Global Assessment of Functioning upon discharge 50.   DISCHARGE MEDICATIONS:  1.  Wellbutrin XL 300 mg in the morning.  2.  Lunesta 3 mg at night.  3.  Remeron 30 mg at night.  4.  Ativan 1 mg every eight hours as needed for anxiety.  5.  Albuterol inhaler 2 puffs every six hours as needed.   FOLLOW UP:  Dr. Randye Lobo.      IL/MEDQ  D:  07/09/2004  T:  07/10/2004  Job:  [id number]

## 2010-07-18 NOTE — H&P (Signed)
[facility]  Patient:    [name], [name]                        MRN: [id number] Adm. Date:  [id number] Attending:  Deeann Cree CC:         Freddie Apley, M.D.   History and Physical  HISTORY:  This 55 year old white female has had a long history of stress urinary incontinence; she also has some leakage between her bouts of stress incontinence.  She wears pads.  She has nocturia every two hours.  She has had four vaginal deliveries before.  She has had a previous vaginal hysterectomy and anterior repair several years ago.  Urodynamic studies showed no uninhibited bladder contractions.  She had a lot of discomfort with bladder filling, so she underwent cystoscopy in September with hydraulic distention of the bladder and findings consistent with interstitial cystitis.  Because of continued incontinence, she is brought to the OR today for surgical repair.  PAST HISTORY:  She has had problems with gastroesophageal reflux and asthma and lumbar degenerative joint disease.  PAST SURGICAL HISTORY:  Previous surgical procedures include the hysterectomy, as noted, and cystoscopy but no other procedures.  MEDICATIONS:  Albuterol and Proventil inhalers p.r.n., two puffs at a time.  ALLERGIES:  CIPRO, which was noted today, with some erythema in the arm upon IV infusion.  SOCIAL HISTORY:  Tobacco:  A half a pack per day.  Ethanol:  None.  FAMILY HISTORY:  Noncontributory.  REVIEW OF SYSTEMS:  As noted in her health history form.  PHYSICAL EXAMINATION  GENERAL:  Pleasant white female appearing to be stated age.  She is alert and oriented.  Skin is warm and dry.  In no acute distress.  VITAL SIGNS:  Blood pressure is 90/70.  Pulse is 100.  Temperature is 98.4. Respiratory rate is 18.  LUNGS:  No respiratory distress.  HEART:  Heart sounds are regular.  ABDOMEN:  Soft and nontender.  PELVIC:  She has a cystocele on pelvic  exam.  IMPRESSION 1. Stress incontinence. 2. Cystocele. 3. Asthma. 4. Gastroesophageal reflux disease. 5. Degenerative joint disease of the lumbar spine.  PLAN:  Surgical repair of cystocele and pubovaginal sling. DD:  12/17/99 TD:  12/17/99 Job: [id number]

## 2010-07-18 NOTE — Op Note (Signed)
[place]  Patient:    [name], [name]                        MRN: [id number] Proc. Date: 10/28/99 Adm. Date:  [id number] Disc. Date: [id number] Attending:  Deeann Cree                           Operative Report  PREOPERATIVE DIAGNOSIS:  Rule out interstitial cystitis.  POSTOPERATIVE DIAGNOSIS:  Rule out interstitial cystisis.  OPERATION PERFORMED:  Cystoscopy, hydraulic dilation of bladder, cold cup bladder biopsy and instillation of Clorpactin in the bladder.  SURGEON:  Lucina Mellow. Terance Hart, M.D.  ANESTHESIA:  General.  INDICATIONS FOR PROCEDURE:  This 55 year old white female had stress incontinence but she also has nocturia.  She underwent urodynamic studies in the office and had some significant pain and discomfort with filling the bladder and was concerned about the diagnosis of interstitial cystitis.  She may need surgery for stress incontinence, but I will need to rule out IC as a complicating factor.  Cystogram in the office showed no reflux.  DESCRIPTION OF PROCEDURE:  The patient was brought to the operating room and placed in lithotomy position.  External genitalia were prepped and draped in the usual fashion.  She was cystoscoped and the bladder was unremarkable. There were no stones, tumors or inflammatory lesions.  She held about 500 cc of water and at that point, I emptied the bladder and looked back in and she had widespread submucosal petechiae and gross hemorrhage in all four quadrants of the bladder.  Typical hemorrhagic areas in the bladder base were biopsied with cold cup bladder biopsy forceps.  She was then treated with 10 minutes of in and out irrigation with 0.4% Clorpactin solution.  After that, the bladder was irrigated with sterile water and the bladder emptied and the patient taken to the recovery room in good condition. DD:  10/28/99 TD:  10/28/99 Job: [id number]

## 2010-09-18 ENCOUNTER — Other Ambulatory Visit: Payer: Self-pay | Admitting: Surgery

## 2010-12-04 LAB — CARDIAC PANEL(CRET KIN+CKTOT+MB+TROPI): CK, MB: 1.9 ng/mL (ref 0.3–4.0)

## 2010-12-05 LAB — POCT CARDIAC MARKERS
CKMB, poc: 1 ng/mL (ref 1.0–8.0)
Troponin i, poc: <0.05 ng/mL (ref 0.00–0.09)

## 2010-12-05 LAB — LIPID PANEL
Cholesterol: 267 mg/dL — ABNORMAL HIGH (ref 0–200)
LDL Cholesterol: UNDETERMINED mg/dL (ref 0–99)
Total CHOL/HDL Ratio: 10.3 RATIO
Triglycerides: 685 mg/dL — ABNORMAL HIGH (ref <150)

## 2010-12-05 LAB — CK TOTAL AND CKMB (NOT AT ARMC)
CK, MB: 1.4 ng/mL (ref 0.3–4.0)
CK, MB: 1.9 ng/mL (ref 0.3–4.0)

## 2010-12-05 LAB — TROPONIN I: Troponin I: <0.01 ng/mL (ref 0.00–0.06)

## 2010-12-05 LAB — CBC
HCT: 37.8 % (ref 36.0–46.0)
Platelets: 227 10*3/uL (ref 150–400)
RDW: 12.3 % (ref 11.5–15.5)
WBC: 7.4 10*3/uL (ref 4.0–10.5)

## 2010-12-05 LAB — BASIC METABOLIC PANEL
BUN: 12 mg/dL (ref 6–23)
Chloride: 108 mEq/L (ref 96–112)
Potassium: 4.2 mEq/L (ref 3.5–5.1)

## 2011-10-21 ENCOUNTER — Other Ambulatory Visit: Payer: Self-pay | Admitting: *Deleted

## 2011-10-21 ENCOUNTER — Other Ambulatory Visit: Payer: Self-pay | Admitting: Family Medicine

## 2011-10-21 DIAGNOSIS — Z1231 Encounter for screening mammogram for malignant neoplasm of breast: Secondary | ICD-10-CM

## 2011-11-04 ENCOUNTER — Ambulatory Visit: Payer: Self-pay

## 2012-11-01 ENCOUNTER — Encounter: Payer: Self-pay | Admitting: *Deleted

## 2012-11-02 ENCOUNTER — Ambulatory Visit (INDEPENDENT_AMBULATORY_CARE_PROVIDER_SITE_OTHER): Payer: BC Managed Care – PPO | Admitting: Physician Assistant

## 2012-11-02 ENCOUNTER — Telehealth: Payer: Self-pay | Admitting: *Deleted

## 2012-11-02 ENCOUNTER — Encounter: Payer: Self-pay | Admitting: Physician Assistant

## 2012-11-02 VITALS — BP 124/80 | HR 94 | Ht 67.0 in | Wt 163.4 lb

## 2012-11-02 DIAGNOSIS — R5383 Other fatigue: Secondary | ICD-10-CM

## 2012-11-02 DIAGNOSIS — R079 Chest pain, unspecified: Secondary | ICD-10-CM

## 2012-11-02 DIAGNOSIS — R0602 Shortness of breath: Secondary | ICD-10-CM

## 2012-11-02 DIAGNOSIS — R5381 Other malaise: Secondary | ICD-10-CM

## 2012-11-02 DIAGNOSIS — E785 Hyperlipidemia, unspecified: Secondary | ICD-10-CM

## 2012-11-02 DIAGNOSIS — I2510 Atherosclerotic heart disease of native coronary artery without angina pectoris: Secondary | ICD-10-CM

## 2012-11-02 LAB — BASIC METABOLIC PANEL
CO2: 31 mEq/L (ref 19–32)
Chloride: 103 mEq/L (ref 96–112)
Glucose, Bld: 89 mg/dL (ref 70–99)
Sodium: 138 mEq/L (ref 135–145)

## 2012-11-02 LAB — CBC WITH DIFFERENTIAL/PLATELET
Basophils Relative: 0.4 % (ref 0.0–3.0)
Eosinophils Absolute: 0.2 10*3/uL (ref 0.0–0.7)
Hemoglobin: 13.9 g/dL (ref 12.0–15.0)
Lymphs Abs: 2.7 10*3/uL (ref 0.7–4.0)
MCHC: 33.8 g/dL (ref 30.0–36.0)
MCV: 88.2 fl (ref 78.0–100.0)
Monocytes Absolute: 0.8 10*3/uL (ref 0.1–1.0)
Neutro Abs: 5.3 10*3/uL (ref 1.4–7.7)
RBC: 4.67 Mil/uL (ref 3.87–5.11)
RDW: 13.6 % (ref 11.5–14.6)

## 2012-11-02 LAB — HEPATIC FUNCTION PANEL
ALT: 19 U/L (ref 0–35)
AST: 19 U/L (ref 0–37)
Albumin: 3.9 g/dL (ref 3.5–5.2)
Total Protein: 6.8 g/dL (ref 6.0–8.3)

## 2012-11-02 MED ORDER — AMLODIPINE BESYLATE 2.5 MG PO TABS: 2.5000 mg | ORAL_TABLET | Freq: Every day | ORAL | Status: DC

## 2012-11-02 MED ORDER — NITROGLYCERIN 0.4 MG SL SUBL: 0.4000 mg | SUBLINGUAL_TABLET | SUBLINGUAL | Status: DC | PRN

## 2012-11-02 NOTE — Telephone Encounter (Signed)
pt notified about normal labs today with verbal understanding

## 2012-11-02 NOTE — Progress Notes (Signed)
[facility]. [address], [address], [place]  [address] Phone: [phone] Fax:  [phone]  Date:  11/02/2012   ID:  Nature, [name] November 17, 1955, MRN [id number]  PCP:  Imelda Pillow, NP  Cardiologist:  Dr. Lauree Chandler     History of Present Illness: [name] is a 57 y.o. female who returns for follow up the  She has a hx of asthma, depression, fibromyalgia, GERD and CAD. Nuclear study a 12/09: EF 59%, no ischemia. [place] 04/2008 demonstrated 80% stenosis in the mid RCA treated with a Xience DES. Patient was noted to have possible SVT during cardiac rehabilitation 05/2008.  Re-look LHC 05/2008: Proximal RCA 25%, mid RCA stent patent. Holter 03/2009: NSR, PACs and PVCs. Liver biopsy 2011 reportedly normal (steatohepatitis).  She has not tolerated statins in the past. Last seen by Dr. Lauree Chandler  In 12/2009.    She notes significant fatigue over the last several months. She also notes dyspnea with minimal activities. She describes NYHA class IIb-III symptoms. She also has chest discomfort. This has been ongoing for years. She is not certain if it is getting worse. She denies exertional symptoms. She does note some left scapular pain. She notes similar symptoms prior to her PCI in 2010. She denies associated nausea but has been diaphoretic. She denies syncope. She sleeps on 2 pillows chronically. She denies PND or significant edema. She has occasional palpitations. These are fairly stable.  Of note, she has come off of most of her cardiac medications. This occurred when she ran out of insurance several years ago.  Labs (12/10):  K 3.7, Cr 0.58, ALT 65 Labs (3/11):    Hgb 14  Wt Readings from Last 3 Encounters:  11/02/12 163 lb 6.4 oz (74.118 kg)  01/21/10 166 lb 12.8 oz (75.66 kg)  02/19/09 178 lb (80.74 kg)     Past Medical History  Diagnosis Date  . Coronary artery disease     a. Nuc Study 12/09:  no ischemia, EF 59%;  b. s/p Xience DES to [facility] 04/2008;   c.  LHC 05/2008: Proximal RCA 25%, mid RCA stent patent  . Hyperlipidemia     intol to statins and other chol agents due to elevated LFTs  . Depression   . GERD (gastroesophageal reflux disease)   . Fibromyalgia     Current Outpatient Prescriptions  Medication Sig Dispense Refill  . albuterol (PROVENTIL) (5 MG/ML) 0.5% nebulizer solution Take 2.5 mg by nebulization every 6 (six) hours as needed for wheezing.      Marland Kitchen buPROPion (ZYBAN) 150 MG 12 hr tablet Take 150 mg by mouth 2 (two) times daily.      . ergocalciferol (VITAMIN D2) 50000 UNITS capsule Take 50,000 Units by mouth once a week.      . estazolam (PROSOM) 2 MG tablet Take 2 mg by mouth at bedtime.      . fluticasone (FLOVENT HFA) 220 MCG/ACT inhaler Inhale 1 puff into the lungs 2 (two) times daily.      Marland Kitchen HYDROcodone-acetaminophen (NORCO) 7.5-325 MG per tablet Take 1 tablet by mouth every 6 (six) hours as needed for pain.      Marland Kitchen LORazepam (ATIVAN) 2 MG tablet Take 2 mg by mouth every 6 (six) hours as needed for anxiety.      . montelukast (SINGULAIR) 10 MG tablet Take 10 mg by mouth at bedtime.      Marland Kitchen amLODipine (NORVASC) 2.5 MG tablet Take 1 tablet (2.5 mg  total) by mouth daily.  30 tablet  11  . nitroGLYCERIN (NITROSTAT) 0.4 MG SL tablet Place 1 tablet (0.4 mg total) under the tongue every 5 (five) minutes as needed for chest pain.  25 tablet  2   No current facility-administered medications for this visit.    Allergies:    Allergies  Allergen Reactions  . Ciprofloxacin   . Codeine     Social History:  The patient  reports that she has never smoked. She does not have any smokeless tobacco history on file.   ROS:  Please see the history of present illness.   She has occasional diarrhea due to IBS. She denies any cough or wheezing. She does note some dry skin.   All other systems reviewed and negative.   PHYSICAL EXAM: VS:  BP 124/80  Pulse 94  Ht 5' 7"  (1.702 m)  Wt 163 lb 6.4 oz (74.118 kg)  BMI 25.59 kg/m2 Well  nourished, well developed, in no acute distress HEENT: normal Neck: no JVD Vascular: No carotid bruits Cardiac:  normal S1, S2; RRR; no murmur Lungs:  Decreased breath sounds, expiratory wheezes throughout, no rales Abd: soft, nontender, no hepatomegaly Ext: no edema Skin: warm and dry Neuro:  CNs 2-12 intact, no focal abnormalities noted  EKG:  NSR, HR 94, normal axis, nonspecific ST-T wave changes     ASSESSMENT AND PLAN:  1. CAD: She has fairly stable chest symptoms. However, she does have some left scapular pain. This is fairly similar to what she had in 2010 prior to her PCI. It is not clear if this is brought on by exertion. She seems to note it at different times. She has noted increased dyspnea with exertion. She no longer takes a beta blocker. She tells me that she has had significant headaches with nitroglycerin the past. Therefore, I will not place her on isosorbide. She is wheezing on exam. At this point in time, I do not think restarting her beta blocker is advised. Therefore, I will place her on amlodipine 2.5 mg daily for antianginal therapy. I will arrange a ETT-Myoview. 2. Fatigue: Etiology not clear. Check a basic metabolic panel, CBC, TSH, LFTs. 3. Dyspnea: Proceed with Myoview as noted. I will also check a BNP. If this is significantly elevated, I will arrange a formal echocardiogram. I suspect her dyspnea is related to COPD given her prior history of smoking. She notes a history of asthma. Her PCP has been managing her respiratory medications. 4. Hyperlipidemia: She has had significant problems with elevated LFTs in the past. She's had liver biopsies performed as well. She has been unable to take statins as well as WelChol and Lovaza. She will likely need a referral back to the lipid clinic at some point. 5. Disposition:  F/u with Dr. Lauree Chandler in 4 weeks.  Signed, Richardson Dopp, PA-C  11/02/2012 5:47 PM

## 2012-11-02 NOTE — Patient Instructions (Addendum)
AN RX FOR NITROGLYCERIN HAS BEEN SENT IN FOR YOU TODAY AND YOU HAVE BEEN ADVISED AS TO HOW AND WHEN TO USE NTG  START AMLODIPINE 2.5 MG DAILY; RX WAS SENT IN    LABS TODAY; BMET, CBC W/DIFF, TSH, BNP, LFT  PLEASE SCHEDULE TO HAVE AN EXERCISE MYOVIEW  MAKE SURE TO FOLLOW UP WITH YOUR PRIMARY CARE PHYSICIAN ABOUT YOUR ASTHMA  PLEASE FOLLOW UP WITH DR. Angelena Form IN ABOUT 4 WEEKS

## 2012-11-21 ENCOUNTER — Ambulatory Visit (HOSPITAL_COMMUNITY): Payer: BC Managed Care – PPO | Attending: Physician Assistant | Admitting: Radiology

## 2012-11-21 VITALS — BP 108/63 | Ht 67.0 in | Wt 159.0 lb

## 2012-11-21 DIAGNOSIS — R5381 Other malaise: Secondary | ICD-10-CM | POA: Insufficient documentation

## 2012-11-21 DIAGNOSIS — I2510 Atherosclerotic heart disease of native coronary artery without angina pectoris: Secondary | ICD-10-CM

## 2012-11-21 DIAGNOSIS — Z9861 Coronary angioplasty status: Secondary | ICD-10-CM | POA: Insufficient documentation

## 2012-11-21 DIAGNOSIS — R002 Palpitations: Secondary | ICD-10-CM | POA: Insufficient documentation

## 2012-11-21 DIAGNOSIS — R079 Chest pain, unspecified: Secondary | ICD-10-CM | POA: Insufficient documentation

## 2012-11-21 DIAGNOSIS — J45909 Unspecified asthma, uncomplicated: Secondary | ICD-10-CM | POA: Insufficient documentation

## 2012-11-21 DIAGNOSIS — R0609 Other forms of dyspnea: Secondary | ICD-10-CM | POA: Insufficient documentation

## 2012-11-21 DIAGNOSIS — R0989 Other specified symptoms and signs involving the circulatory and respiratory systems: Secondary | ICD-10-CM | POA: Insufficient documentation

## 2012-11-21 DIAGNOSIS — R5383 Other fatigue: Secondary | ICD-10-CM | POA: Insufficient documentation

## 2012-11-21 MED ORDER — TECHNETIUM TC 99M SESTAMIBI GENERIC - CARDIOLITE
11.0000 | Freq: Once | INTRAVENOUS | Status: AC | PRN
  Administered 2012-11-21: 11 via INTRAVENOUS

## 2012-11-21 MED ORDER — REGADENOSON 0.4 MG/5ML IV SOLN
0.4000 mg | Freq: Once | INTRAVENOUS | Status: AC
  Administered 2012-11-21: 0.4 mg via INTRAVENOUS

## 2012-11-21 MED ORDER — TECHNETIUM TC 99M SESTAMIBI GENERIC - CARDIOLITE
33.0000 | Freq: Once | INTRAVENOUS | Status: AC | PRN
  Administered 2012-11-21: 33 via INTRAVENOUS

## 2012-11-21 NOTE — Progress Notes (Signed)
[facility] NUCLEAR MED [address] [place], [place] [address] [phone]    Cardiology Nuclear Med Study  [name] is a 57 y.o. female     MRN : [id number]     DOB: 1955/10/08  Procedure Date: 11/21/2012  Nuclear Med Background Indication for Stress Test:  Evaluation for Ischemia and Stent Patency History:  Asthma and '09 ECHO: EF: 60% MPS: EF: 59% (-) ischemia, 3/10 Stents: RCA, 4/10 Heart Cath: Patent Stent N/O CAD Cardiac Risk Factors: Lipids  Symptoms:  Chest Pain, Diaphoresis, DOE, Fatigue, Palpitations and Rapid HR   Nuclear Pre-Procedure Caffeine/Decaff Intake:  None > 12hrs NPO After: 5:00pm   Lungs:  clear O2 Sat: 96% on room air. IV 0.9% NS with Angio Cath:  20g  IV Site: R Antecubital x 1, tolerated well IV Started by:  Irven Baltimore, RN  Chest Size (in):  38 Cup Size: B  Height: 5' 7"  (1.702 m)  Weight:  159 lb (72.122 kg)  BMI:  Body mass index is 24.9 kg/(m^2). Tech Comments:  n/a    Nuclear Med Study 1 or 2 day study: 1 day  Stress Test Type:  Lexiscan  Reading MD: Loralie Champagne, MD  Order Authorizing Provider:  Lauree Chandler, MD, and Richardson Dopp, [facility]  Resting Radionuclide: Technetium 60mSestamibi  Resting Radionuclide Dose: 11.0 mCi   Stress Radionuclide:  Technetium 964mestamibi  Stress Radionuclide Dose: 33.0 mCi           Stress Protocol Rest HR: 81 Stress HR: 108  Rest BP: 108/63 Stress BP: 119/70  Exercise Time (min): n/a METS: n/a   Predicted Max HR: 162 bpm % Max HR: 66.67 bpm Rate Pressure Product: 12852   Dose of Adenosine (mg):  n/a Dose of Lexiscan: 0.4 mg  Dose of Atropine (mg): n/a Dose of Dobutamine: n/a mcg/kg/min (at max HR)  Stress Test Technologist: SaPerrin MalteseEMT-P  Nuclear Technologist:  ToAnnye RuskCNMT     Rest Procedure:  Myocardial perfusion imaging was performed at rest 45 minutes following the intravenous administration of Technetium 9975mstamibi. Rest ECG: NSR - Normal  EKG  Stress Procedure:  The patient received IV Lexiscan 0.4 mg over 15-seconds.  Technetium 48m76mtamibi injected at 30-seconds. This patient had sob and felt funny with the Lexiscan injection. Quantitative spect images were obtained after a 45 minute delay. Stress ECG: No significant change from baseline ECG  QPS Raw Data Images:  Normal; no motion artifact; normal heart/lung ratio. Stress Images:  Small, mild basal inferior perfusion defect. Rest Images:  Small, mild basal inferior perfusion defect. Defect is somewhat less prominent than with stress. Subtraction (SDS):  Primarily fixed small mild basal inferior perfusion defect. Transient Ischemic Dilatation (Normal <1.22):  n/a Lung/Heart Ratio (Normal <0.45):  0.37  Quantitative Gated Spect Images QGS EDV:  74 ml QGS ESV:  29 ml  Impression Exercise Capacity:  Lexiscan with no exercise. BP Response:  Normal blood pressure response. Clinical Symptoms:  Short of breath.  ECG Impression:  No significant ST segment change suggestive of ischemia. Comparison with Prior Nuclear Study: No images to compare  Overall Impression:  Low risk stress nuclear study with a small, mild partially reversible basal inferior perfusion defect.  Cannot rule out small area of ischemia but may be soft tissue attenuation. .  LV Ejection Fraction: 61%.  LV Wall Motion:  NL LV Function; NL Wall Motion  DaltLoralie Champagne2/2014

## 2012-11-22 ENCOUNTER — Encounter: Payer: Self-pay | Admitting: Physician Assistant

## 2012-12-02 ENCOUNTER — Encounter: Payer: Self-pay | Admitting: Cardiovascular Disease

## 2012-12-02 ENCOUNTER — Ambulatory Visit (INDEPENDENT_AMBULATORY_CARE_PROVIDER_SITE_OTHER): Payer: Private Health Insurance - Indemnity | Admitting: Cardiovascular Disease

## 2012-12-02 ENCOUNTER — Encounter: Payer: Self-pay | Admitting: *Deleted

## 2012-12-02 VITALS — BP 113/75 | HR 97 | Wt 163.0 lb

## 2012-12-02 DIAGNOSIS — I2510 Atherosclerotic heart disease of native coronary artery without angina pectoris: Secondary | ICD-10-CM

## 2012-12-02 DIAGNOSIS — R079 Chest pain, unspecified: Secondary | ICD-10-CM

## 2012-12-02 DIAGNOSIS — R9439 Abnormal result of other cardiovascular function study: Secondary | ICD-10-CM

## 2012-12-02 LAB — CBC WITH DIFFERENTIAL/PLATELET
Basophils Absolute: 0 10*3/uL (ref 0.0–0.1)
Eosinophils Absolute: 0.1 10*3/uL (ref 0.0–0.7)
Hemoglobin: 12.6 g/dL (ref 12.0–15.0)
Lymphocytes Relative: 25.4 % (ref 12.0–46.0)
MCHC: 34 g/dL (ref 30.0–36.0)
Neutro Abs: 3.6 10*3/uL (ref 1.4–7.7)
Platelets: 210 10*3/uL (ref 150.0–400.0)
RDW: 13.5 % (ref 11.5–14.6)

## 2012-12-02 LAB — BASIC METABOLIC PANEL
Calcium: 8.6 mg/dL (ref 8.4–10.5)
Chloride: 104 mEq/L (ref 96–112)
Creatinine, Ser: 0.7 mg/dL (ref 0.4–1.2)

## 2012-12-02 LAB — PROTIME-INR: Prothrombin Time: 9.6 s — ABNORMAL LOW (ref 10.2–12.4)

## 2012-12-02 NOTE — Progress Notes (Signed)
History of Present Illness: 57 y.o. female with history of asthma, depression, GERD, fibromyalgia and CAD who is here today for cardiac follow up. Nuclear study 12/09: EF 59%, no ischemia. [place] 04/2008 demonstrated 80% stenosis in the mid RCA treated with a Xience DES. Patient was noted to have possible SVT during cardiac rehabilitation 05/2008. Re-look LHC 05/2008: Proximal RCA 25%, mid RCA stent patent. Holter 03/2009: NSR, PACs and PVCs. Liver biopsy 2011 reportedly normal (steatohepatitis). She has not tolerated statins in the past. Last seen 11/03/12 by Richardson Dopp, PA-C. She had been off of all cardiac meds. She noted significant fatigue, dyspnea and chest pain. Stress myoview arranged on  11/21/12 which showed a small, mild partially reversible basal inferior perfusion defect. Cannot rule out small area of ischemia but may be soft tissue attenuation. LV Ejection Fraction: 61%. LV Wall Motion: NL LV Function; NL Wall Motion. BMET, CBC and TSH were ok. She has hematuria and has been seen by urology.   She is here today for follow up. She continues to have chest pain and back pain. She has continued dyspnea with minimal exertion. Similar to pain prior to stent.   Primary Care Physician: Kathryne Hitch  Last Lipid Profile:Lipid Panel     Component Value Date/Time   CHOL  Value: 267        ATP III CLASSIFICATION:  <200     mg/dL   Desirable  200-239  mg/dL   Borderline High  >=240    mg/dL   High* 02/04/2008 0215   TRIG 685* 02/04/2008 0215   HDL 26* 02/04/2008 0215   CHOLHDL 10.3 02/04/2008 0215   VLDL UNABLE TO CALCULATE IF TRIGLYCERIDE OVER 400 mg/dL 02/04/2008 0215   LDLCALC  Value: UNABLE TO CALCULATE IF TRIGLYCERIDE OVER 400 mg/dL        Total Cholesterol/HDL:CHD Risk Coronary Heart Disease Risk Table                     Men   Women  1/2 Average Risk   3.4   3.3 02/04/2008 0215     Past Medical History  Diagnosis Date  . Coronary artery disease     a. Nuc Study 12/09:  no ischemia, EF 59%;   b. s/p Xience DES to [facility] 04/2008;  c.  LHC 05/2008: Proximal RCA 25%, mid RCA stent patent;  d. Lexiscan Myoview (9/14):  Low risk, small mild partially reversible basal inferior perfusion defect (cannot rule out small area of ischemia, but may be soft tissue attenuation), EF 61%, normal wall motion  . Hyperlipidemia     intol to statins and other chol agents due to elevated LFTs  . Depression   . GERD (gastroesophageal reflux disease)   . Fibromyalgia     No past surgical history on file.  Current Outpatient Prescriptions  Medication Sig Dispense Refill  . albuterol (PROVENTIL) (5 MG/ML) 0.5% nebulizer solution Take 2.5 mg by nebulization every 6 (six) hours as needed for wheezing.      Marland Kitchen amLODipine (NORVASC) 2.5 MG tablet Take 1 tablet (2.5 mg total) by mouth daily.  30 tablet  11  . buPROPion (ZYBAN) 150 MG 12 hr tablet Take 150 mg by mouth 2 (two) times daily.      . ergocalciferol (VITAMIN D2) 50000 UNITS capsule Take 50,000 Units by mouth once a week.      . estazolam (PROSOM) 2 MG tablet Take 2 mg by mouth at bedtime.      Marland Kitchen  fluticasone (FLOVENT HFA) 220 MCG/ACT inhaler Inhale 1 puff into the lungs 2 (two) times daily.      Marland Kitchen HYDROcodone-acetaminophen (NORCO) 7.5-325 MG per tablet Take 1 tablet by mouth every 6 (six) hours as needed for pain.      Marland Kitchen LORazepam (ATIVAN) 2 MG tablet Take 2 mg by mouth every 6 (six) hours as needed for anxiety.      . montelukast (SINGULAIR) 10 MG tablet Take 10 mg by mouth at bedtime.      . nitroGLYCERIN (NITROSTAT) 0.4 MG SL tablet Place 1 tablet (0.4 mg total) under the tongue every 5 (five) minutes as needed for chest pain.  25 tablet  2   No current facility-administered medications for this visit.    Allergies  Allergen Reactions  . Ciprofloxacin   . Codeine     History   Social History  . Marital Status: Married    Spouse Name: N/A    Number of Children: N/A  . Years of Education: N/A   Occupational History  . Not on file.    Social History Main Topics  . Smoking status: Never Smoker   . Smokeless tobacco: Not on file  . Alcohol Use: Not on file  . Drug Use: Not on file  . Sexual Activity: Not on file   Other Topics Concern  . Not on file   Social History Narrative  . No narrative on file    Family History: No CAD  Review of Systems:  As stated in the HPI and otherwise negative.   BP 113/75  Pulse 97  Wt 163 lb (73.936 kg)  BMI 25.52 kg/m2  Physical Examination: General: Well developed, well nourished, NAD HEENT: OP clear, mucus membranes moist SKIN: warm, dry. No rashes. Neuro: No focal deficits Musculoskeletal: Muscle strength 5/5 all ext Psychiatric: Mood and affect normal Neck: No JVD, no carotid bruits, no thyromegaly, no lymphadenopathy. Lungs:Clear bilaterally, no wheezes, rhonci, crackles Cardiovascular: Regular rate and rhythm. No murmurs, gallops or rubs. Abdomen:Soft. Bowel sounds present. Non-tender.  Extremities: No lower extremity edema. Pulses are 2 + in the bilateral DP/PT.  Stress myoview 11/21/12: Stress Procedure: The patient received IV Lexiscan 0.4 mg over 15-seconds. Technetium 31mSestamibi injected at 30-seconds. This patient had sob and felt funny with the Lexiscan injection. Quantitative spect images were obtained after a 45 minute delay.  Stress ECG: No significant change from baseline ECG  QPS  Raw Data Images: Normal; no motion artifact; normal heart/lung ratio.  Stress Images: Small, mild basal inferior perfusion defect.  Rest Images: Small, mild basal inferior perfusion defect. Defect is somewhat less prominent than with stress.  Subtraction (SDS): Primarily fixed small mild basal inferior perfusion defect.  Transient Ischemic Dilatation (Normal <1.22): n/a  Lung/Heart Ratio (Normal <0.45): 0.37  Quantitative Gated Spect Images  QGS EDV: 74 ml  QGS ESV: 29 ml  Impression  Exercise Capacity: Lexiscan with no exercise.  BP Response: Normal blood pressure  response.  Clinical Symptoms: Short of breath.  ECG Impression: No significant ST segment change suggestive of ischemia.  Comparison with Prior Nuclear Study: No images to compare  Overall Impression: Low risk stress nuclear study with a small, mild partially reversible basal inferior perfusion defect. Cannot rule out small area of ischemia but may be soft tissue attenuation. .  LV Ejection Fraction: 61%. LV Wall Motion: NL LV Function; NL Wall Motion  Assessment and Plan:   1. CAD: She has recurrence of chest pain similar to what she  had before her stent was placed. Also dyspnea. Stress test is abnormal. Will plan cardiac catheterization on 12/06/12. Risks and benefits reviewed with pt. Pre-cath labs today.

## 2012-12-02 NOTE — Patient Instructions (Addendum)
Your physician recommends that you schedule a follow-up appointment in:  About 4 weeks.   Your physician has requested that you have a cardiac catheterization. Cardiac catheterization is used to diagnose and/or treat various heart conditions. Doctors may recommend this procedure for a number of different reasons. The most common reason is to evaluate chest pain. Chest pain can be a symptom of coronary artery disease (CAD), and cardiac catheterization can show whether plaque is narrowing or blocking your heart's arteries. This procedure is also used to evaluate the valves, as well as measure the blood flow and oxygen levels in different parts of your heart. For further information please visit [email or web address]. Please follow instruction sheet, as given. Scheduled for December 06, 2012  Coronary Angiography Coronary angiography is an X-ray procedure used to look at the arteries in the heart. In this procedure, a dye is injected through a long, hollow tube (catheter). The catheter is about the size of a piece of cooked spaghetti. The catheter injects a dye into an artery in your groin. X-rays are then taken to show if there is a blockage in the arteries of your heart. BEFORE THE PROCEDURE   Let your caregiver know if you have allergies to shellfish or contrast dye. Also let your caregiver know if you have kidney problems or failure.  Do not eat or drink starting from midnight up to the time of the procedure, or as directed.  You may drink enough water to take your medications the morning of the procedure if you were instructed to do so.  You should be at the hospital or outpatient facility where the procedure is to be done 60 minutes prior to the procedure or as directed. PROCEDURE  You may be given an IV medication to help you relax before the procedure.  You will be prepared for the procedure by washing and shaving the area where the catheter will be inserted. This is usually done in the groin but  may be done in the fold of your arm by your elbow.  A medicine will be given to numb your groin where the catheter will be inserted.  A specially trained doctor will insert the catheter into an artery in your groin. The catheter is guided by using a special type of X-ray (fluoroscopy) to the blood vessel being examined.  A special dye is then injected into the catheter and X-rays are taken. The dye helps to show where any narrowing or blockages are located in the heart arteries. AFTER THE PROCEDURE   After the procedure you will be kept in bed lying flat for several hours. You will be instructed to not bend or cross your legs.  The groin insertion site will be watched and checked frequently.  The pulse in your feet will be checked frequently.  Additional blood tests, X-rays and an EKG may be done.  You may stay in the hospital overnight for observation. SEEK IMMEDIATE MEDICAL CARE IF:   You develop chest pain, shortness of breath, feel faint, or pass out.  There is bleeding, swelling, or drainage from the catheter insertion site.  You develop pain, discoloration, coldness, or severe bruising in the leg or area where the catheter was inserted.  You have a fever. Document Released: 08/23/2002 Document Revised: 05/11/2011 Document Reviewed: 10/12/2007 [facility] Patient Information 2014 [place].

## 2012-12-05 ENCOUNTER — Encounter (HOSPITAL_COMMUNITY): Payer: Self-pay | Admitting: Pharmacy Technician

## 2012-12-06 ENCOUNTER — Encounter (HOSPITAL_COMMUNITY)
Admission: RE | Disposition: A | Discharge status: Home / Self Care | Payer: Managed Care, Other (non HMO) | Source: Ambulatory Visit | Attending: Cardiovascular Disease

## 2012-12-06 ENCOUNTER — Ambulatory Visit (HOSPITAL_COMMUNITY)
Admission: RE | Admit: 2012-12-06 | Discharge: 2012-12-07 | Disposition: A | Discharge status: Home / Self Care | Payer: Managed Care, Other (non HMO) | Source: Ambulatory Visit | Attending: Cardiovascular Disease | Admitting: Cardiovascular Disease

## 2012-12-06 ENCOUNTER — Encounter (HOSPITAL_COMMUNITY): Payer: Self-pay | Admitting: General Practice

## 2012-12-06 DIAGNOSIS — Z79899 Other long term (current) drug therapy: Secondary | ICD-10-CM | POA: Insufficient documentation

## 2012-12-06 DIAGNOSIS — Z23 Encounter for immunization: Secondary | ICD-10-CM | POA: Insufficient documentation

## 2012-12-06 DIAGNOSIS — J45909 Unspecified asthma, uncomplicated: Secondary | ICD-10-CM | POA: Insufficient documentation

## 2012-12-06 DIAGNOSIS — F3289 Other specified depressive episodes: Secondary | ICD-10-CM

## 2012-12-06 DIAGNOSIS — K219 Gastro-esophageal reflux disease without esophagitis: Secondary | ICD-10-CM | POA: Insufficient documentation

## 2012-12-06 DIAGNOSIS — Z955 Presence of coronary angioplasty implant and graft: Secondary | ICD-10-CM

## 2012-12-06 DIAGNOSIS — I200 Unstable angina: Secondary | ICD-10-CM

## 2012-12-06 DIAGNOSIS — R079 Chest pain, unspecified: Secondary | ICD-10-CM

## 2012-12-06 DIAGNOSIS — IMO0001 Reserved for inherently not codable concepts without codable children: Secondary | ICD-10-CM

## 2012-12-06 DIAGNOSIS — E785 Hyperlipidemia, unspecified: Secondary | ICD-10-CM

## 2012-12-06 DIAGNOSIS — I2510 Atherosclerotic heart disease of native coronary artery without angina pectoris: Secondary | ICD-10-CM

## 2012-12-06 DIAGNOSIS — F329 Major depressive disorder, single episode, unspecified: Secondary | ICD-10-CM | POA: Insufficient documentation

## 2012-12-06 DIAGNOSIS — Z7902 Long term (current) use of antithrombotics/antiplatelets: Secondary | ICD-10-CM | POA: Insufficient documentation

## 2012-12-06 DIAGNOSIS — Z9861 Coronary angioplasty status: Secondary | ICD-10-CM | POA: Insufficient documentation

## 2012-12-06 HISTORY — DX: Migraine, unspecified, not intractable, without status migrainosus: G43.909

## 2012-12-06 HISTORY — PX: LEFT HEART CATHETERIZATION WITH CORONARY ANGIOGRAM: SHX5451

## 2012-12-06 HISTORY — DX: Headache, unspecified: R51.9

## 2012-12-06 HISTORY — DX: Pneumonia, unspecified organism: J18.9

## 2012-12-06 HISTORY — DX: Unspecified osteoarthritis, unspecified site: M19.90

## 2012-12-06 HISTORY — DX: Unspecified asthma, uncomplicated: J45.909

## 2012-12-06 HISTORY — DX: Headache: R51

## 2012-12-06 HISTORY — DX: Anemia, unspecified: D64.9

## 2012-12-06 HISTORY — DX: Inflammatory liver disease, unspecified: K75.9

## 2012-12-06 HISTORY — DX: Personal history of other diseases of the digestive system: Z87.19

## 2012-12-06 HISTORY — PX: PERCUTANEOUS CORONARY STENT INTERVENTION (PCI-S): SHX5485

## 2012-12-06 SURGERY — LEFT HEART CATHETERIZATION WITH CORONARY ANGIOGRAM
Anesthesia: LOCAL

## 2012-12-06 MED ORDER — SODIUM CHLORIDE 0.9 % IJ SOLN: 3.0000 mL | INTRAMUSCULAR | Status: DC | PRN

## 2012-12-06 MED ORDER — ZOLPIDEM TARTRATE 5 MG PO TABS: 5.0000 mg | ORAL_TABLET | Freq: Every evening | ORAL | Status: DC | PRN

## 2012-12-06 MED ORDER — CLOPIDOGREL BISULFATE 300 MG PO TABS
ORAL_TABLET | ORAL | Status: AC
  Filled 2012-12-06: qty 2

## 2012-12-06 MED ORDER — BUPROPION HCL ER (SR) 150 MG PO TB12
150.0000 mg | ORAL_TABLET | Freq: Two times a day (BID) | ORAL | Status: DC
  Administered 2012-12-06 – 2012-12-07 (×2): 150 mg via ORAL
  Filled 2012-12-06 (×4): qty 1

## 2012-12-06 MED ORDER — HEPARIN SODIUM (PORCINE) 1000 UNIT/ML IJ SOLN
INTRAMUSCULAR | Status: AC
  Filled 2012-12-06: qty 1

## 2012-12-06 MED ORDER — NITROGLYCERIN 0.2 MG/ML ON CALL CATH LAB
INTRAVENOUS | Status: AC
  Filled 2012-12-06: qty 1

## 2012-12-06 MED ORDER — BIVALIRUDIN 250 MG IV SOLR
INTRAVENOUS | Status: AC
  Filled 2012-12-06: qty 250

## 2012-12-06 MED ORDER — ALUM & MAG HYDROXIDE-SIMETH 200-200-20 MG/5ML PO SUSP
30.0000 mL | ORAL | Status: DC | PRN
  Administered 2012-12-06 (×2): 30 mL via ORAL
  Filled 2012-12-06 (×2): qty 30

## 2012-12-06 MED ORDER — SODIUM CHLORIDE 0.9 % IV SOLN: 250.0000 mL | INTRAVENOUS | Status: DC | PRN

## 2012-12-06 MED ORDER — SODIUM CHLORIDE 0.9 % IV SOLN: INTRAVENOUS | Status: AC

## 2012-12-06 MED ORDER — DIAZEPAM 5 MG PO TABS
ORAL_TABLET | ORAL | Status: AC
  Administered 2012-12-06: 5 mg via ORAL
  Filled 2012-12-06: qty 1

## 2012-12-06 MED ORDER — BUPROPION HCL ER (SMOKING DET) 150 MG PO TB12: 150.0000 mg | ORAL_TABLET | Freq: Two times a day (BID) | ORAL | Status: DC

## 2012-12-06 MED ORDER — LORAZEPAM 0.5 MG PO TABS
2.0000 mg | ORAL_TABLET | Freq: Four times a day (QID) | ORAL | Status: DC | PRN
  Administered 2012-12-06: 22:00:00 2 mg via ORAL
  Filled 2012-12-06: qty 4

## 2012-12-06 MED ORDER — ONDANSETRON HCL 4 MG/2ML IJ SOLN
INTRAMUSCULAR | Status: AC
  Administered 2012-12-07: 05:00:00 4 mg via INTRAVENOUS
  Filled 2012-12-06: qty 2

## 2012-12-06 MED ORDER — VERAPAMIL HCL 2.5 MG/ML IV SOLN
INTRAVENOUS | Status: AC
  Filled 2012-12-06: qty 2

## 2012-12-06 MED ORDER — PANTOPRAZOLE SODIUM 40 MG PO TBEC
40.0000 mg | DELAYED_RELEASE_TABLET | Freq: Every day | ORAL | Status: DC
  Administered 2012-12-07: 05:00:00 40 mg via ORAL
  Filled 2012-12-06 (×2): qty 1

## 2012-12-06 MED ORDER — MONTELUKAST SODIUM 10 MG PO TABS
10.0000 mg | ORAL_TABLET | Freq: Every day | ORAL | Status: DC
  Administered 2012-12-06: 10 mg via ORAL
  Filled 2012-12-06 (×2): qty 1

## 2012-12-06 MED ORDER — ALBUTEROL SULFATE HFA 108 (90 BASE) MCG/ACT IN AERS: 2.0000 | INHALATION_SPRAY | Freq: Four times a day (QID) | RESPIRATORY_TRACT | Status: DC | PRN

## 2012-12-06 MED ORDER — FAMOTIDINE IN NACL 20-0.9 MG/50ML-% IV SOLN
INTRAVENOUS | Status: AC
  Filled 2012-12-06: qty 50

## 2012-12-06 MED ORDER — INFLUENZA VAC SPLIT QUAD 0.5 ML IM SUSP
0.5000 mL | INTRAMUSCULAR | Status: AC
  Administered 2012-12-07: 0.5 mL via INTRAMUSCULAR
  Filled 2012-12-06: qty 0.5

## 2012-12-06 MED ORDER — MIDAZOLAM HCL 2 MG/2ML IJ SOLN
INTRAMUSCULAR | Status: AC
  Filled 2012-12-06: qty 2

## 2012-12-06 MED ORDER — FLUTICASONE PROPIONATE HFA 220 MCG/ACT IN AERO
1.0000 | INHALATION_SPRAY | Freq: Two times a day (BID) | RESPIRATORY_TRACT | Status: DC
  Administered 2012-12-06: 1 via RESPIRATORY_TRACT
  Filled 2012-12-06: qty 12

## 2012-12-06 MED ORDER — ONDANSETRON HCL 4 MG/2ML IJ SOLN
4.0000 mg | Freq: Four times a day (QID) | INTRAMUSCULAR | Status: DC | PRN
  Administered 2012-12-07: 4 mg via INTRAVENOUS
  Filled 2012-12-06: qty 2

## 2012-12-06 MED ORDER — LIDOCAINE HCL (PF) 1 % IJ SOLN
INTRAMUSCULAR | Status: AC
  Filled 2012-12-06: qty 30

## 2012-12-06 MED ORDER — DIAZEPAM 5 MG PO TABS
5.0000 mg | ORAL_TABLET | ORAL | Status: AC
  Administered 2012-12-06: 5 mg via ORAL

## 2012-12-06 MED ORDER — ACETAMINOPHEN 325 MG PO TABS: 650.0000 mg | ORAL_TABLET | ORAL | Status: DC | PRN

## 2012-12-06 MED ORDER — ALUM & MAG HYDROXIDE-SIMETH 200-200-20 MG/5ML PO SUSP
15.0000 mL | Freq: Once | ORAL | Status: DC
  Filled 2012-12-06: qty 30

## 2012-12-06 MED ORDER — ASPIRIN 81 MG PO CHEW
CHEWABLE_TABLET | ORAL | Status: AC
  Administered 2012-12-06: 81 mg via ORAL
  Filled 2012-12-06: qty 1

## 2012-12-06 MED ORDER — AMLODIPINE BESYLATE 2.5 MG PO TABS
2.5000 mg | ORAL_TABLET | Freq: Every day | ORAL | Status: DC
  Administered 2012-12-06 – 2012-12-07 (×2): 2.5 mg via ORAL
  Filled 2012-12-06 (×3): qty 1

## 2012-12-06 MED ORDER — ALBUTEROL SULFATE (5 MG/ML) 0.5% IN NEBU: 2.5000 mg | INHALATION_SOLUTION | Freq: Four times a day (QID) | RESPIRATORY_TRACT | Status: DC | PRN

## 2012-12-06 MED ORDER — FENTANYL CITRATE 0.05 MG/ML IJ SOLN
INTRAMUSCULAR | Status: AC
  Filled 2012-12-06: qty 2

## 2012-12-06 MED ORDER — ALUM & MAG HYDROXIDE-SIMETH 200-200-20 MG/5ML PO SUSP
ORAL | Status: AC
  Filled 2012-12-06: qty 30

## 2012-12-06 MED ORDER — CLOPIDOGREL BISULFATE 75 MG PO TABS
75.0000 mg | ORAL_TABLET | Freq: Every day | ORAL | Status: DC
Start: 2012-12-07 — End: 2012-12-07
  Administered 2012-12-07: 75 mg via ORAL

## 2012-12-06 MED ORDER — ASPIRIN 81 MG PO CHEW
81.0000 mg | CHEWABLE_TABLET | ORAL | Status: AC
  Administered 2012-12-06: 81 mg via ORAL

## 2012-12-06 MED ORDER — NITROGLYCERIN 0.4 MG SL SUBL: 0.4000 mg | SUBLINGUAL_TABLET | SUBLINGUAL | Status: DC | PRN

## 2012-12-06 MED ORDER — ASPIRIN 81 MG PO CHEW
81.0000 mg | CHEWABLE_TABLET | Freq: Every day | ORAL | Status: DC
  Administered 2012-12-07: 10:00:00 81 mg via ORAL
  Filled 2012-12-06: qty 1

## 2012-12-06 MED ORDER — SODIUM CHLORIDE 0.9 % IV SOLN
INTRAVENOUS | Status: DC
  Administered 2012-12-06: 11:00:00 via INTRAVENOUS

## 2012-12-06 MED ORDER — HEPARIN (PORCINE) IN NACL 2-0.9 UNIT/ML-% IJ SOLN
INTRAMUSCULAR | Status: AC
  Filled 2012-12-06: qty 1000

## 2012-12-06 MED ORDER — SODIUM CHLORIDE 0.9 % IJ SOLN: 3.0000 mL | Freq: Two times a day (BID) | INTRAMUSCULAR | Status: DC

## 2012-12-06 NOTE — H&P (View-Only) (Signed)
History of Present Illness: 57 y.o. female with history of asthma, depression, GERD, fibromyalgia and CAD who is here today for cardiac follow up. Nuclear study 12/09: EF 59%, no ischemia. [place] 04/2008 demonstrated 80% stenosis in the mid RCA treated with a Xience DES. Patient was noted to have possible SVT during cardiac rehabilitation 05/2008. Re-look LHC 05/2008: Proximal RCA 25%, mid RCA stent patent. Holter 03/2009: NSR, PACs and PVCs. Liver biopsy 2011 reportedly normal (steatohepatitis). She has not tolerated statins in the past. Last seen 11/03/12 by Richardson Dopp, PA-C. She had been off of all cardiac meds. She noted significant fatigue, dyspnea and chest pain. Stress myoview arranged on  11/21/12 which showed a small, mild partially reversible basal inferior perfusion defect. Cannot rule out small area of ischemia but may be soft tissue attenuation. LV Ejection Fraction: 61%. LV Wall Motion: NL LV Function; NL Wall Motion. BMET, CBC and TSH were ok. She has hematuria and has been seen by urology.   She is here today for follow up. She continues to have chest pain and back pain. She has continued dyspnea with minimal exertion. Similar to pain prior to stent.   Primary Care Physician: Kathryne Hitch  Last Lipid Profile:Lipid Panel     Component Value Date/Time   CHOL  Value: 267        ATP III CLASSIFICATION:  <200     mg/dL   Desirable  200-239  mg/dL   Borderline High  >=240    mg/dL   High* 02/04/2008 0215   TRIG 685* 02/04/2008 0215   HDL 26* 02/04/2008 0215   CHOLHDL 10.3 02/04/2008 0215   VLDL UNABLE TO CALCULATE IF TRIGLYCERIDE OVER 400 mg/dL 02/04/2008 0215   LDLCALC  Value: UNABLE TO CALCULATE IF TRIGLYCERIDE OVER 400 mg/dL        Total Cholesterol/HDL:CHD Risk Coronary Heart Disease Risk Table                     Men   Women  1/2 Average Risk   3.4   3.3 02/04/2008 0215     Past Medical History  Diagnosis Date  . Coronary artery disease     a. Nuc Study 12/09:  no ischemia, EF 59%;   b. s/p Xience DES to [facility] 04/2008;  c.  LHC 05/2008: Proximal RCA 25%, mid RCA stent patent;  d. Lexiscan Myoview (9/14):  Low risk, small mild partially reversible basal inferior perfusion defect (cannot rule out small area of ischemia, but may be soft tissue attenuation), EF 61%, normal wall motion  . Hyperlipidemia     intol to statins and other chol agents due to elevated LFTs  . Depression   . GERD (gastroesophageal reflux disease)   . Fibromyalgia     No past surgical history on file.  Current Outpatient Prescriptions  Medication Sig Dispense Refill  . albuterol (PROVENTIL) (5 MG/ML) 0.5% nebulizer solution Take 2.5 mg by nebulization every 6 (six) hours as needed for wheezing.      Marland Kitchen amLODipine (NORVASC) 2.5 MG tablet Take 1 tablet (2.5 mg total) by mouth daily.  30 tablet  11  . buPROPion (ZYBAN) 150 MG 12 hr tablet Take 150 mg by mouth 2 (two) times daily.      . ergocalciferol (VITAMIN D2) 50000 UNITS capsule Take 50,000 Units by mouth once a week.      . estazolam (PROSOM) 2 MG tablet Take 2 mg by mouth at bedtime.      Marland Kitchen  fluticasone (FLOVENT HFA) 220 MCG/ACT inhaler Inhale 1 puff into the lungs 2 (two) times daily.      Marland Kitchen HYDROcodone-acetaminophen (NORCO) 7.5-325 MG per tablet Take 1 tablet by mouth every 6 (six) hours as needed for pain.      Marland Kitchen LORazepam (ATIVAN) 2 MG tablet Take 2 mg by mouth every 6 (six) hours as needed for anxiety.      . montelukast (SINGULAIR) 10 MG tablet Take 10 mg by mouth at bedtime.      . nitroGLYCERIN (NITROSTAT) 0.4 MG SL tablet Place 1 tablet (0.4 mg total) under the tongue every 5 (five) minutes as needed for chest pain.  25 tablet  2   No current facility-administered medications for this visit.    Allergies  Allergen Reactions  . Ciprofloxacin   . Codeine     History   Social History  . Marital Status: Married    Spouse Name: N/A    Number of Children: N/A  . Years of Education: N/A   Occupational History  . Not on file.    Social History Main Topics  . Smoking status: Never Smoker   . Smokeless tobacco: Not on file  . Alcohol Use: Not on file  . Drug Use: Not on file  . Sexual Activity: Not on file   Other Topics Concern  . Not on file   Social History Narrative  . No narrative on file    Family History: No CAD  Review of Systems:  As stated in the HPI and otherwise negative.   BP 113/75  Pulse 97  Wt 163 lb (73.936 kg)  BMI 25.52 kg/m2  Physical Examination: General: Well developed, well nourished, NAD HEENT: OP clear, mucus membranes moist SKIN: warm, dry. No rashes. Neuro: No focal deficits Musculoskeletal: Muscle strength 5/5 all ext Psychiatric: Mood and affect normal Neck: No JVD, no carotid bruits, no thyromegaly, no lymphadenopathy. Lungs:Clear bilaterally, no wheezes, rhonci, crackles Cardiovascular: Regular rate and rhythm. No murmurs, gallops or rubs. Abdomen:Soft. Bowel sounds present. Non-tender.  Extremities: No lower extremity edema. Pulses are 2 + in the bilateral DP/PT.  Stress myoview 11/21/12: Stress Procedure: The patient received IV Lexiscan 0.4 mg over 15-seconds. Technetium 57mSestamibi injected at 30-seconds. This patient had sob and felt funny with the Lexiscan injection. Quantitative spect images were obtained after a 45 minute delay.  Stress ECG: No significant change from baseline ECG  QPS  Raw Data Images: Normal; no motion artifact; normal heart/lung ratio.  Stress Images: Small, mild basal inferior perfusion defect.  Rest Images: Small, mild basal inferior perfusion defect. Defect is somewhat less prominent than with stress.  Subtraction (SDS): Primarily fixed small mild basal inferior perfusion defect.  Transient Ischemic Dilatation (Normal <1.22): n/a  Lung/Heart Ratio (Normal <0.45): 0.37  Quantitative Gated Spect Images  QGS EDV: 74 ml  QGS ESV: 29 ml  Impression  Exercise Capacity: Lexiscan with no exercise.  BP Response: Normal blood pressure  response.  Clinical Symptoms: Short of breath.  ECG Impression: No significant ST segment change suggestive of ischemia.  Comparison with Prior Nuclear Study: No images to compare  Overall Impression: Low risk stress nuclear study with a small, mild partially reversible basal inferior perfusion defect. Cannot rule out small area of ischemia but may be soft tissue attenuation. .  LV Ejection Fraction: 61%. LV Wall Motion: NL LV Function; NL Wall Motion  Assessment and Plan:   1. CAD: She has recurrence of chest pain similar to what she  had before her stent was placed. Also dyspnea. Stress test is abnormal. Will plan cardiac catheterization on 12/06/12. Risks and benefits reviewed with pt. Pre-cath labs today.

## 2012-12-06 NOTE — Interval H&P Note (Signed)
History and Physical Interval Note:  12/06/2012 10:43 AM  [name]  has presented today for cardiac cath with the diagnosis of CAD, chest pain, abnormal stress myoview.  The various methods of treatment have been discussed with the patient and family. After consideration of risks, benefits and other options for treatment, the patient has consented to  Procedure(s): LEFT HEART CATHETERIZATION WITH CORONARY ANGIOGRAM (N/A) as a surgical intervention .  The patient's history has been reviewed, patient examined, no change in status, stable for surgery.  I have reviewed the patient's chart and labs.  Questions were answered to the patient's satisfaction.    Cath Lab Visit (complete for each Cath Lab visit)  Clinical Evaluation Leading to the Procedure:   ACS: no  Non-ACS:    Anginal Classification: CCS II  Anti-ischemic medical therapy: Minimal Therapy (1 class of medications)  Non-Invasive Test Results: Low-risk stress test findings: cardiac mortality <1%/year  Prior CABG: No previous CABG       MCALHANY,CHRISTOPHER

## 2012-12-06 NOTE — Interval H&P Note (Signed)
History and Physical Interval Note:  12/06/2012 12:04 PM  Pt explained to [place] before the procedure that she has been having severe dyspnea with minimal exertion and at rest with chest pressure. This is c/w her previous unstable angina prior to stent placement. She is classified as class III angina.   Cath Lab Visit (complete for each Cath Lab visit)  Clinical Evaluation Leading to the Procedure:   ACS: no  Non-ACS:    Anginal Classification: CCS III  Anti-ischemic medical therapy: Minimal Therapy (1 class of medications)  Non-Invasive Test Results: Low-risk stress test findings: cardiac mortality <1%/year  Prior CABG: No previous CABG           [name]

## 2012-12-06 NOTE — CV Procedure (Signed)
Cardiac Catheterization Operative Report  LITTLE BASHORE [id number] 10/7/201412:06 PM Diana Pillow, NP  Procedure Performed:  1. Left Heart Catheterization 2. Selective Coronary Angiography 3. Left ventricular angiogram 4. PTCA/DES x 1 mid RCA  Operator: Lauree Chandler, MD  Arterial access site:  Right radial artery.   Indication:  57 yo female with history of CAD with prior stenting of the mid RCA with recent chest pain and dyspnea with minimal exertion and at rest. Symptoms are similar to her angina prior to her last stent placement in 2010. Stress myoview with inferior wall ischemia, low risk overall. She is classified as class III angina.                                   Procedure Details: The risks, benefits, complications, treatment options, and expected outcomes were discussed with the patient. The patient and/or family concurred with the proposed plan, giving informed consent. The patient was brought to the cath lab after IV hydration was begun and oral premedication was given. The patient was further sedated with Versed and Fentanyl. The right wrist was assessed with an Allens test which was positive. The right wrist was prepped and draped in a sterile fashion. 1% lidocaine was used for local anesthesia. Using the modified Seldinger access technique, a 5/6 French sheath was placed in the right radial artery. 3 mg Verapamil was given through the sheath. 3500 units IV heparin was given. Standard diagnostic catheters were used to perform selective coronary angiography. A pigtail catheter was used to perform a left ventricular angiogram.  The patient was found to have a severe stenosis in the mid RCA. Her stress test showed inferior wall ischemia. She described chest pain and dyspnea at rest and with minimal exertion c/w class III angina. I elected based on AUC to proceed with PCI of the mid RCA.   PCI Note: The patient was given a bolus of Angiomax and a drip was  started. She was given Plavix 600 mg po x 1. I then engaged the RCA with a JR4 guiding catheter. When the ACT was greater than 200, I passed a Cougar IC wire down the RCA. I then pre-dilated the lesion with a 2.0 x 45m balloon. The mid vessel had a severe stenosis followed by a stented segment with moderate distal stent restenosis. I elected to cover the entire diseased segment with one stent. A 2.75 x 28 mm Promus Premier DES was deployed in the mid RCA. The stent was post-dilated with a 3.0 x 20 mm [place] balloon x 2. There was an excellent angiographic result. The stenosis was taken from 95% down to 0%.  The sheath was removed from the right radial artery and a Terumo hemostasis band was applied at the arteriotomy site on the right wrist.    There were no immediate complications. The patient was taken to the recovery area in stable condition.   Hemodynamic Findings: Central aortic pressure: 127/77 Left ventricular pressure: 121/9/15  Angiographic Findings:  Left main:  No obstructive disease.   Left Anterior Descending Artery: Large caliber vessel that courses to the apex. The proximal vessel has a 30% stenosis. The mid and distal vessel has diffuse plaque disease. The moderate caliber diagonal branch has mild plaque disease.   Circumflex Artery: Large caliber vessel with two small caliber obtuse marginal branches followed by a third moderate caliber, bifurcating obtuse marginal branch. No  obstructive disease.   Right Coronary Artery: Large, dominant vessel with 20% proximal stenosis. The mid vessel has a focal 95% stenosis just before the old stent. The distal segment of the stent has 60% restenosis. The PDA is small to moderate in caliber with ostial 60% stenosis.   Left Ventricular Angiogram: LVEF=55-60%  Impression: 1. Single vessel CAD 2. Unstable angina secondary to severe stenosis mid RCA 3. Successful PTCA/DES x 1 mid RCA 4. Preserved LV systolic function  Recommendations: She will  need to be on ASA and Plavix for one year. Continue amlodipine. She has not tolerated statins or beta blockers in the past. Likely d/c tomorrow in am.        Complications:  None. The patient tolerated the procedure well.

## 2012-12-07 DIAGNOSIS — I2510 Atherosclerotic heart disease of native coronary artery without angina pectoris: Secondary | ICD-10-CM

## 2012-12-07 DIAGNOSIS — I200 Unstable angina: Secondary | ICD-10-CM | POA: Diagnosis present

## 2012-12-07 LAB — CBC
HCT: 37.9 % (ref 36.0–46.0)
Hemoglobin: 12.9 g/dL (ref 12.0–15.0)
MCHC: 34 g/dL (ref 30.0–36.0)
MCV: 88.6 fL (ref 78.0–100.0)
RDW: 13 % (ref 11.5–15.5)

## 2012-12-07 LAB — BASIC METABOLIC PANEL
BUN: 11 mg/dL (ref 6–23)
CO2: 28 mEq/L (ref 19–32)
Creatinine, Ser: 0.68 mg/dL (ref 0.50–1.10)
GFR calc Af Amer: >90 mL/min (ref >90)
GFR calc non Af Amer: >90 mL/min (ref >90)

## 2012-12-07 MED ORDER — ASPIRIN 81 MG PO CHEW: 81.0000 mg | CHEWABLE_TABLET | Freq: Every day | ORAL | Status: DC

## 2012-12-07 MED ORDER — CLOPIDOGREL BISULFATE 75 MG PO TABS: 75.0000 mg | ORAL_TABLET | Freq: Every day | ORAL | Status: DC

## 2012-12-07 MED ORDER — NITROGLYCERIN 0.4 MG SL SUBL: 0.4000 mg | SUBLINGUAL_TABLET | SUBLINGUAL | Status: AC | PRN

## 2012-12-07 MED ORDER — PANTOPRAZOLE SODIUM 40 MG PO TBEC: 40.0000 mg | DELAYED_RELEASE_TABLET | Freq: Every day | ORAL | Status: DC

## 2012-12-07 MED ORDER — FENOFIBRATE 145 MG PO TABS: 145.0000 mg | ORAL_TABLET | Freq: Every day | ORAL | Status: DC

## 2012-12-07 MED FILL — Sodium Chloride IV Soln 0.9%: INTRAVENOUS | Qty: 50 | Status: AC

## 2012-12-07 NOTE — Discharge Summary (Signed)
CARDIOLOGY DISCHARGE SUMMARY   Patient ID: [name] MRN: [id number] DOB/AGE: January 01, 1956 57 y.o.  Admit date: 12/06/2012 Discharge date: 12/07/2012  Primary Discharge Diagnosis:    Secondary Discharge Diagnosis:  Active Problems:   Unstable angina  Procedure Performed:  1. Left Heart Catheterization 2. Selective Coronary Angiography 3. Left ventricular angiogram 4. PTCA/DES x 1 mid RCA  Hospital Course: [name] is a 57 y.o. female with a history of CAD.  She developed chest pain and progressive angina. She had a stress test which showed some ischemia and was seen in the office by Dr. Angelena Form. Cardiac catheterization was scheduled and she came to the hospital for the procedure on 12/06/2012.  Cardiac catheterization results are below. She had a drug-eluting stent to the mid RCA with good results.  On 12/07/2012, as Hussar was seen by Dr. Angelena Form and by cardiac rehabilitation. Dr. Angelena Form evaluated [name] and reviewed all data. She does not have a recent lipid profile but on her previous profile, she had triglycerides greater than 600. She has not tolerated statins in the past but will be tried on fenofibrate.   [name] ambulated with cardiac rehabilitation and did well, and no chest pain or shortness of breath. Dr. Angelena Form considered [name] improved and stable for discharge, to follow up as an outpatient.  Labs:  Lab Results  Component Value Date   WBC 5.9 12/02/2012   HGB 12.9 12/07/2012   HCT 37.9 12/07/2012   MCV 88.6 12/07/2012   PLT 234 12/07/2012     Recent Labs Lab 12/07/12 0530  NA 140  K 4.4  CL 105  CO2 28  BUN 11  CREATININE 0.68  CALCIUM 8.9  GLUCOSE 98   Lab Results  Component Value Date   CHOL  Value: 267        ATP III CLASSIFICATION:  <200     mg/dL   Desirable  200-239  mg/dL   Borderline High  >=240    mg/dL   High* 02/04/2008   HDL 26* 02/04/2008   LDLCALC  Value: UNABLE TO CALCULATE IF TRIGLYCERIDE OVER 400 mg/dL          02/04/2008   TRIG 685* 02/04/2008   CHOLHDL 10.3 02/04/2008   Cardiac Cath: *12/06/2012 Left main: No obstructive disease.  Left Anterior Descending Artery: Large caliber vessel that courses to the apex. The proximal vessel has a 30% stenosis. The mid and distal vessel has diffuse plaque disease. The moderate caliber diagonal branch has mild plaque disease.  Circumflex Artery: Large caliber vessel with two small caliber obtuse marginal branches followed by a third moderate caliber, bifurcating obtuse marginal branch. No obstructive disease.  Right Coronary Artery: Large, dominant vessel with 20% proximal stenosis. The mid vessel has a focal 95% stenosis just before the old stent. The distal segment of the stent has 60% restenosis. The PDA is small to moderate in caliber with ostial 60% stenosis.  Left Ventricular Angiogram: LVEF=55-60%  Impression:  1. Single vessel CAD  2. Unstable angina secondary to severe stenosis mid RCA  3. Successful PTCA/DES x 1 mid RCA  4. Preserved LV systolic function  Recommendations: She will need to be on ASA and Plavix for one year. Continue amlodipine. She has not tolerated statins or beta blockers in the past  EKG: 12/07/2012 Vent. rate 73 BPM PR interval 160 ms QRS duration 82 ms QT/QTc 406/447 ms P-R-T axes 72 82 78  FOLLOW UP PLANS AND APPOINTMENTS Allergies  Allergen Reactions  . Ciprofloxacin Hives  . Codeine Hives and Itching  . Statins      Medication List         albuterol (5 MG/ML) 0.5% nebulizer solution  Commonly known as:  PROVENTIL  Take 2.5 mg by nebulization every 6 (six) hours as needed for wheezing.     albuterol 108 (90 BASE) MCG/ACT inhaler  Commonly known as:  PROVENTIL HFA;VENTOLIN HFA  Inhale 2 puffs into the lungs every 6 (six) hours as needed for wheezing.     amLODipine 2.5 MG tablet  Commonly known as:  NORVASC  Take 1 tablet (2.5 mg total) by mouth daily.     aspirin 81 MG chewable tablet  Chew 1 tablet (81 mg  total) by mouth daily.     buPROPion 150 MG 12 hr tablet  Commonly known as:  ZYBAN  Take 150 mg by mouth 2 (two) times daily.     clopidogrel 75 MG tablet  Commonly known as:  PLAVIX  Take 1 tablet (75 mg total) by mouth daily with breakfast.     ergocalciferol 50000 UNITS capsule  Commonly known as:  VITAMIN D2  Take 50,000 Units by mouth once a week. On Sunday     fenofibrate 145 MG tablet  Commonly known as:  TRICOR  Take 1 tablet (145 mg total) by mouth daily. Take 1/2 tab daily QPM for 1 week, then 1 tab daily.     fluticasone 220 MCG/ACT inhaler  Commonly known as:  FLOVENT HFA  Inhale 1 puff into the lungs 2 (two) times daily.     HYDROcodone-acetaminophen 7.5-325 MG per tablet  Commonly known as:  NORCO  Take 1 tablet by mouth every 6 (six) hours as needed for pain.     LORazepam 2 MG tablet  Commonly known as:  ATIVAN  Take 2 mg by mouth every 6 (six) hours as needed for anxiety.     montelukast 10 MG tablet  Commonly known as:  SINGULAIR  Take 10 mg by mouth at bedtime.     nitroGLYCERIN 0.4 MG SL tablet  Commonly known as:  NITROSTAT  Place 1 tablet (0.4 mg total) under the tongue every 5 (five) minutes as needed for chest pain.     pantoprazole 40 MG tablet  Commonly known as:  PROTONIX  Take 1 tablet (40 mg total) by mouth daily at 6 (six) AM.        Discharge Orders   Future Appointments Provider Department Dept Phone   01/04/2013 4:45 PM [name], MD [place] Office [phone]   Future Orders Complete By Expires   Amb Referral to Cardiac Rehabilitation  As directed    Diet - low sodium heart healthy  As directed    Increase activity slowly  As directed        BRING ALL MEDICATIONS WITH YOU TO FOLLOW UP APPOINTMENTS  Time spent with patient to include physician time: 38 min Signed: Rosaria Ferries, PA-C 12/07/2012, 9:38 AM Co-Sign MD

## 2012-12-07 NOTE — Progress Notes (Signed)
CARDIAC REHAB PHASE I   PRE:  Rate/Rhythm: 92SR  BP:  Supine:   Sitting: 115/78  Standing:    SaO2:   MODE:  Ambulation: 1000 ft   POST:  Rate/Rhythm: 100SR  BP:  Supine:   Sitting: 121/69  Standing:    SaO2:  0750-0840 Pt walked 1000 ft on RA with steady gait. Tolerated well. No CP. Education completed. Discussed CRP 2. Pt attended one session in the past but would like referral if insurance covers. Will refer to [facility] Phase 2. Gave brochure and encouraged pt to check with insurance.   Graylon Good, RN BSN  12/07/2012 8:34 AM

## 2012-12-07 NOTE — Discharge Summary (Signed)
See full note.cdm 

## 2012-12-07 NOTE — Progress Notes (Signed)
     SUBJECTIVE: Mild epigastric discomfort overnight. Resolved with Maalox.   BP 120/67  Pulse 75  Temp(Src) 98.3 F (36.8 C) (Oral)  Resp 20  Ht 5' 7"  (1.702 m)  Wt 163 lb 2.3 oz (74 kg)  BMI 25.55 kg/m2  SpO2 100%  Intake/Output Summary (Last 24 hours) at 12/07/12 8676 Last data filed at 12/07/12 0000  Gross per 24 hour  Intake    750 ml  Output      0 ml  Net    750 ml    PHYSICAL EXAM General: Well developed, well nourished, in no acute distress. Alert and oriented x 3.  Psych:  Good affect, responds appropriately Neck: No JVD. No masses noted.  Lungs: Clear bilaterally with no wheezes or rhonci noted.  Heart: RRR with no murmurs noted. Abdomen: Bowel sounds are present. Soft, non-tender.  Extremities: No lower extremity edema.   LABS: Basic Metabolic Panel:  Recent Labs  12/07/12 0530  NA 140  K 4.4  CL 105  CO2 28  GLUCOSE 98  BUN 11  CREATININE 0.68  CALCIUM 8.9   CBC:  Recent Labs  12/07/12 0530  WBC PENDING  HGB 12.9  HCT 37.9  MCV 88.6  PLT 234   Current Meds: . alum & mag hydroxide-simeth  15 mL Oral Once  . amLODipine  2.5 mg Oral Daily  . aspirin  81 mg Oral Daily  . buPROPion  150 mg Oral BID  . clopidogrel  75 mg Oral Q breakfast  . fluticasone  1 puff Inhalation BID  . influenza vac split quadrivalent PF  0.5 mL Intramuscular Tomorrow-1000  . montelukast  10 mg Oral QHS  . pantoprazole  40 mg Oral Q0600     ASSESSMENT AND PLAN:  1. CAD/unstable angina: Cardiac cath 12/06/12 with severe mid RCA stenosis, now s/p DES x 1 mid RCA. She will need dual anti-platelet therapy with ASA and Plavix for one year. Continue amlodipine. She is intolerant of statins and beta blockers. She will start Pepcid at home for GERD.   2. Dispo: D/C home today. She already has an appointment to see me in several weeks.   MCALHANY,CHRISTOPHER  10/8/20147:12 AM

## 2012-12-07 NOTE — Progress Notes (Deleted)
Wrong pt

## 2013-01-04 ENCOUNTER — Encounter: Payer: Self-pay | Admitting: Cardiovascular Disease

## 2013-01-04 ENCOUNTER — Ambulatory Visit (INDEPENDENT_AMBULATORY_CARE_PROVIDER_SITE_OTHER): Payer: Managed Care, Other (non HMO) | Admitting: Cardiovascular Disease

## 2013-01-04 VITALS — BP 122/78 | HR 92 | Ht 67.0 in | Wt 160.0 lb

## 2013-01-04 DIAGNOSIS — I2510 Atherosclerotic heart disease of native coronary artery without angina pectoris: Secondary | ICD-10-CM

## 2013-01-04 NOTE — Progress Notes (Signed)
History of Present Illness: 57 y.o. female with history of asthma, depression, GERD, fibromyalgia and CAD who is here today for cardiac follow up. Nuclear study 12/09: EF 59%, no ischemia. [facility] 04/2008 demonstrated 80% stenosis in the mid RCA treated with a Xience DES. Patient was noted to have possible SVT during cardiac rehabilitation 05/2008. Re-look LHC 05/2008: Proximal RCA 25%, mid RCA stent patent. Holter 03/2009: NSR, PACs and PVCs. Liver biopsy 2011 reportedly normal (steatohepatitis). She has not tolerated statins in the past. Last seen 11/03/12 by Richardson Dopp, PA-C. She had been off of all cardiac meds. She noted significant fatigue, dyspnea and chest pain. Stress myoview arranged on  11/21/12 which showed a reversible basal inferior perfusion defect. She described class III angina on her f/u in our office 12/02/12. Cardiac cath 12/06/12 with severe stenosis in the mid RCA just before the old stent which also had restenosis. I treated the entire segment with a 2.75 x 28 mm Promus Premier DES. This was post-dilated to 3.0 mm.   She is here today for follow up. She is doing well. NO chest pain or SOB. Right arm is sore from cath.    Primary Care Physician: Kathryne Hitch  Last Lipid Profile: Followed in primary care  Past Medical History  Diagnosis Date  . Coronary artery disease     a. Nuc Study 12/09:  no ischemia, EF 59%;  b. s/p Xience DES to [facility] 04/2008;  c.  [facility] 05/2008: Proximal RCA 25%, mid RCA stent patent;  d. Lexiscan Myoview (9/14):  Low risk, small mild partially reversible basal inferior perfusion defect (cannot rule out small area of ischemia, but may be soft tissue attenuation), EF 61%, normal wall motion  . Hyperlipidemia     intol to statins and other chol agents due to elevated LFTs  . GERD (gastroesophageal reflux disease)   . Fibromyalgia   . Asthma   . Pneumonia     "couple times; not recently" (12/06/2012)  . Chronic bronchitis     "used to get it yearly; not so  much since pneu shots" (12/06/2012)  . Shortness of breath     "all the time last month or so" (12/06/2012)  . Anemia     "long time ago" (12/06/2012)  . H/O hiatal hernia   . Hepatitis     "not A, B, or C" (12/06/2012)  . Sinus headache     "alot" (12/06/2012)  . Migraines     "when I was younger" (12/06/2012)  . Arthritis     "right knee real bad; in my hands bad" (12/06/2012)  . Depression     "years ago" (12/06/2012)    Past Surgical History  Procedure Laterality Date  . Cardiac catheterization  04/2008; 05/2008  . Coronary angioplasty with stent placement  04/2008 12/06/2012    "1 + 1" (12/06/2012)  . Cholecystectomy    . Vaginal hysterectomy    . Tubal ligation      Current Outpatient Prescriptions  Medication Sig Dispense Refill  . albuterol (PROVENTIL HFA;VENTOLIN HFA) 108 (90 BASE) MCG/ACT inhaler Inhale 2 puffs into the lungs every 6 (six) hours as needed for wheezing.      Marland Kitchen albuterol (PROVENTIL) (5 MG/ML) 0.5% nebulizer solution Take 2.5 mg by nebulization every 6 (six) hours as needed for wheezing.      Marland Kitchen amLODipine (NORVASC) 2.5 MG tablet Take 1 tablet (2.5 mg total) by mouth daily.  30 tablet  11  . aspirin 81 MG  chewable tablet Chew 1 tablet (81 mg total) by mouth daily.      Marland Kitchen buPROPion (ZYBAN) 150 MG 12 hr tablet Take 150 mg by mouth 2 (two) times daily.      . clopidogrel (PLAVIX) 75 MG tablet Take 1 tablet (75 mg total) by mouth daily with breakfast.  30 tablet  11  . ergocalciferol (VITAMIN D2) 50000 UNITS capsule Take 50,000 Units by mouth once a week. On Sunday      . fenofibrate (TRICOR) 145 MG tablet Take 1 tablet (145 mg total) by mouth daily. Take 1/2 tab daily QPM for 1 week, then 1 tab daily.  30 tablet  11  . fluticasone (FLOVENT HFA) 220 MCG/ACT inhaler Inhale 1 puff into the lungs 2 (two) times daily.      Marland Kitchen HYDROcodone-acetaminophen (NORCO) 7.5-325 MG per tablet Take 1 tablet by mouth every 6 (six) hours as needed for pain.      Marland Kitchen LORazepam (ATIVAN) 2 MG  tablet Take 2 mg by mouth every 6 (six) hours as needed for anxiety.      . montelukast (SINGULAIR) 10 MG tablet Take 10 mg by mouth at bedtime.      . nitroGLYCERIN (NITROSTAT) 0.4 MG SL tablet Place 1 tablet (0.4 mg total) under the tongue every 5 (five) minutes as needed for chest pain.  25 tablet  12   No current facility-administered medications for this visit.    Allergies  Allergen Reactions  . Ciprofloxacin Hives  . Codeine Hives and Itching  . Statins     History   Social History  . Marital Status: Married    Spouse Name: N/A    Number of Children: N/A  . Years of Education: N/A   Occupational History  . Not on file.   Social History Main Topics  . Smoking status: Former Smoker -- 0.50 packs/day for 30 years    Types: Cigarettes  . Smokeless tobacco: Never Used     Comment: 12/06/2012 "quit smoking cigarettes ~ 8 yr ago"  . Alcohol Use: No  . Drug Use: No  . Sexual Activity: Yes   Other Topics Concern  . Not on file   Social History Narrative  . No narrative on file    Family History: No CAD  Review of Systems:  As stated in the HPI and otherwise negative.   BP 122/78  Pulse 92  Ht 5' 7"  (1.702 m)  Wt 160 lb (72.576 kg)  BMI 25.05 kg/m2  Physical Examination: General: Well developed, well nourished, NAD HEENT: OP clear, mucus membranes moist SKIN: warm, dry. No rashes. Neuro: No focal deficits Musculoskeletal: Muscle strength 5/5 all ext Psychiatric: Mood and affect normal Neck: No JVD, no carotid bruits, no thyromegaly, no lymphadenopathy. Lungs:Clear bilaterally, no wheezes, rhonci, crackles Cardiovascular: Regular rate and rhythm. No murmurs, gallops or rubs. Abdomen:Soft. Bowel sounds present. Non-tender.  Extremities: No lower extremity edema. Pulses are 2 + in the bilateral DP/PT.  Stress myoview 11/21/12: Stress Procedure: The patient received IV Lexiscan 0.4 mg over 15-seconds. Technetium 52mSestamibi injected at 30-seconds. This  patient had sob and felt funny with the Lexiscan injection. Quantitative spect images were obtained after a 45 minute delay.  Stress ECG: No significant change from baseline ECG  QPS  Raw Data Images: Normal; no motion artifact; normal heart/lung ratio.  Stress Images: Small, mild basal inferior perfusion defect.  Rest Images: Small, mild basal inferior perfusion defect. Defect is somewhat less prominent than with stress.  Subtraction (SDS): Primarily fixed small mild basal inferior perfusion defect.  Transient Ischemic Dilatation (Normal <1.22): n/a  Lung/Heart Ratio (Normal <0.45): 0.37  Quantitative Gated Spect Images  QGS EDV: 74 ml  QGS ESV: 29 ml  Impression  Exercise Capacity: Lexiscan with no exercise.  BP Response: Normal blood pressure response.  Clinical Symptoms: Short of breath.  ECG Impression: No significant ST segment change suggestive of ischemia.  Comparison with Prior Nuclear Study: No images to compare  Overall Impression: Low risk stress nuclear study with a small, mild partially reversible basal inferior perfusion defect. Cannot rule out small area of ischemia but may be soft tissue attenuation. .  LV Ejection Fraction: 61%. LV Wall Motion: NL LV Function; NL Wall Motion  Cardiac cath 12/06/12: Left main: No obstructive disease.  Left Anterior Descending Artery: Large caliber vessel that courses to the apex. The proximal vessel has a 30% stenosis. The mid and distal vessel has diffuse plaque disease. The moderate caliber diagonal branch has mild plaque disease.  Circumflex Artery: Large caliber vessel with two small caliber obtuse marginal branches followed by a third moderate caliber, bifurcating obtuse marginal branch. No obstructive disease.  Right Coronary Artery: Large, dominant vessel with 20% proximal stenosis. The mid vessel has a focal 95% stenosis just before the old stent. The distal segment of the stent has 60% restenosis. The PDA is small to moderate in  caliber with ostial 60% stenosis.  Left Ventricular Angiogram: LVEF=55-60%   EKG: NSR, rate 92 bpm.   Assessment and Plan:   1. CAD: s/p recent PCI with stent mid RCA. Doing well. Continue ASA and Plavix. She does not tolerate statins or beta blockers.

## 2013-01-04 NOTE — Patient Instructions (Signed)
Your physician wants you to follow-up in:  6 months. You will receive a reminder letter in the mail two months in advance. If you don't receive a letter, please call our office to schedule the follow-up appointment.   

## 2013-02-14 ENCOUNTER — Telehealth (HOSPITAL_COMMUNITY): Payer: Self-pay | Admitting: Cardiac Rehabilitation

## 2013-02-14 NOTE — Telephone Encounter (Signed)
Unable to reach pt by phone to enroll in cardiac rehab program. Letter mailed to pt home address without response from pt.  Dr. Angelena Form made aware

## 2013-05-04 ENCOUNTER — Emergency Department (HOSPITAL_COMMUNITY): Payer: Managed Care, Other (non HMO)

## 2013-05-04 ENCOUNTER — Encounter (HOSPITAL_COMMUNITY): Payer: Self-pay | Admitting: Emergency Medicine

## 2013-05-04 ENCOUNTER — Emergency Department (HOSPITAL_COMMUNITY)
Admission: EM | Admit: 2013-05-04 | Discharge: 2013-05-04 | Disposition: A | Discharge status: Home / Self Care | Payer: Managed Care, Other (non HMO) | Attending: Emergency Medicine | Admitting: Emergency Medicine

## 2013-05-04 DIAGNOSIS — Z87891 Personal history of nicotine dependence: Secondary | ICD-10-CM | POA: Insufficient documentation

## 2013-05-04 DIAGNOSIS — Z8701 Personal history of pneumonia (recurrent): Secondary | ICD-10-CM | POA: Insufficient documentation

## 2013-05-04 DIAGNOSIS — I209 Angina pectoris, unspecified: Secondary | ICD-10-CM | POA: Insufficient documentation

## 2013-05-04 DIAGNOSIS — Z862 Personal history of diseases of the blood and blood-forming organs and certain disorders involving the immune mechanism: Secondary | ICD-10-CM | POA: Insufficient documentation

## 2013-05-04 DIAGNOSIS — J329 Chronic sinusitis, unspecified: Secondary | ICD-10-CM

## 2013-05-04 DIAGNOSIS — M129 Arthropathy, unspecified: Secondary | ICD-10-CM | POA: Insufficient documentation

## 2013-05-04 DIAGNOSIS — J45909 Unspecified asthma, uncomplicated: Secondary | ICD-10-CM | POA: Insufficient documentation

## 2013-05-04 DIAGNOSIS — I208 Other forms of angina pectoris: Secondary | ICD-10-CM

## 2013-05-04 DIAGNOSIS — F329 Major depressive disorder, single episode, unspecified: Secondary | ICD-10-CM | POA: Insufficient documentation

## 2013-05-04 DIAGNOSIS — F3289 Other specified depressive episodes: Secondary | ICD-10-CM | POA: Insufficient documentation

## 2013-05-04 DIAGNOSIS — Z7982 Long term (current) use of aspirin: Secondary | ICD-10-CM | POA: Insufficient documentation

## 2013-05-04 DIAGNOSIS — Z8719 Personal history of other diseases of the digestive system: Secondary | ICD-10-CM | POA: Insufficient documentation

## 2013-05-04 DIAGNOSIS — Z79899 Other long term (current) drug therapy: Secondary | ICD-10-CM | POA: Insufficient documentation

## 2013-05-04 DIAGNOSIS — E785 Hyperlipidemia, unspecified: Secondary | ICD-10-CM | POA: Insufficient documentation

## 2013-05-04 DIAGNOSIS — Z8739 Personal history of other diseases of the musculoskeletal system and connective tissue: Secondary | ICD-10-CM | POA: Insufficient documentation

## 2013-05-04 DIAGNOSIS — I2510 Atherosclerotic heart disease of native coronary artery without angina pectoris: Secondary | ICD-10-CM | POA: Insufficient documentation

## 2013-05-04 DIAGNOSIS — Z9889 Other specified postprocedural states: Secondary | ICD-10-CM | POA: Insufficient documentation

## 2013-05-04 DIAGNOSIS — G43909 Migraine, unspecified, not intractable, without status migrainosus: Secondary | ICD-10-CM | POA: Insufficient documentation

## 2013-05-04 DIAGNOSIS — Z7902 Long term (current) use of antithrombotics/antiplatelets: Secondary | ICD-10-CM | POA: Insufficient documentation

## 2013-05-04 DIAGNOSIS — IMO0002 Reserved for concepts with insufficient information to code with codable children: Secondary | ICD-10-CM | POA: Insufficient documentation

## 2013-05-04 LAB — I-STAT TROPONIN, ED: Troponin i, poc: 0.06 ng/mL (ref 0.00–0.08)

## 2013-05-04 LAB — BASIC METABOLIC PANEL
BUN: 17 mg/dL (ref 6–23)
CALCIUM: 9 mg/dL (ref 8.4–10.5)
CHLORIDE: 105 meq/L (ref 96–112)
CO2: 24 meq/L (ref 19–32)
CREATININE: 0.7 mg/dL (ref 0.50–1.10)
GFR calc Af Amer: >90 mL/min (ref >90)
GFR calc non Af Amer: >90 mL/min (ref >90)
GLUCOSE: 92 mg/dL (ref 70–99)
Potassium: 4.5 mEq/L (ref 3.7–5.3)
Sodium: 142 mEq/L (ref 137–147)

## 2013-05-04 LAB — CBC WITH DIFFERENTIAL/PLATELET
BASOS ABS: 0 10*3/uL (ref 0.0–0.1)
Basophils Relative: 0 % (ref 0–1)
EOS PCT: 2 % (ref 0–5)
Eosinophils Absolute: 0.2 10*3/uL (ref 0.0–0.7)
HEMATOCRIT: 41.7 % (ref 36.0–46.0)
HEMOGLOBIN: 13.8 g/dL (ref 12.0–15.0)
LYMPHS ABS: 2.2 10*3/uL (ref 0.7–4.0)
Lymphocytes Relative: 28 % (ref 12–46)
MCH: 29.6 pg (ref 26.0–34.0)
MCHC: 33.1 g/dL (ref 30.0–36.0)
MCV: 89.3 fL (ref 78.0–100.0)
MONO ABS: 0.6 10*3/uL (ref 0.1–1.0)
MONOS PCT: 8 % (ref 3–12)
Neutro Abs: 4.7 10*3/uL (ref 1.7–7.7)
Neutrophils Relative %: 62 % (ref 43–77)
Platelets: 247 10*3/uL (ref 150–400)
RBC: 4.67 MIL/uL (ref 3.87–5.11)
RDW: 13.3 % (ref 11.5–15.5)
WBC: 7.6 10*3/uL (ref 4.0–10.5)

## 2013-05-04 LAB — PRO B NATRIURETIC PEPTIDE: Pro B Natriuretic peptide (BNP): 419.1 pg/mL — ABNORMAL HIGH (ref 0–125)

## 2013-05-04 MED ORDER — AMOXICILLIN 500 MG PO CAPS: 500.0000 mg | ORAL_CAPSULE | Freq: Three times a day (TID) | ORAL | Status: DC

## 2013-05-04 NOTE — ED Notes (Signed)
Per EMS pt went to urgent care related to sinus infection and cp for one week. Pt states 3 episodes of heavy central chest pain that radiates into the left arm. States arm pain feels like veins are being ripped out. +sob with pain denies any other complaints. Pt denies pain at this time or in pmd office. Pt is alertx4 respirations easy non labored skin warm and dry. Given 324 asa in office.

## 2013-05-04 NOTE — ED Notes (Signed)
Pt denies complaints at this time. Ambulates without distress.

## 2013-05-04 NOTE — ED Provider Notes (Signed)
CSN: [id number]     Arrival date & time 05/04/13  1218 History   First MD Initiated Contact with Patient 05/04/13 1220     Chief Complaint  Patient presents with  . Chest Pain     (Consider location/radiation/quality/duration/timing/severity/associated sxs/prior Treatment) HPI Cardiologist: Dr. Shon Hough   This patient was sent to the emergency department from the urgent care by EMS. She went to the urgent care to have her sinus infection treated and to discuss her chest pain with the provider. She has a history of stent in the right coronary artery 5-1/2 years ago and then it needed to be replaced after becoming blocked again 1-1/2 years ago. She reports that last week she had an episode of chest pain that lasted approximately 20-30 minutes. In 2 days ago she had an episode of chest pain that lasted 20-30 minutes in the morning and the second episode in the evening. She's not had any more chest pain yesterday or today. The patient reports that she feels as though the pain might come back again but it hasn't. She also reports feeling more tired than normal. 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 Her pain would not resolve no matter how she would lay but did get better with sitting. Denies becoming diaphoretic, nauseous, vomiting, diarrhea, abdominal pain or weak during these episodes.  NO PAIN EPISODES TODAY OR YESTERDAY  Past Medical History  Diagnosis Date  . Coronary artery disease     a. Nuc Study 12/09:  no ischemia, EF 59%;  b. s/p Xience DES to [facility] 04/2008;  c.  LHC 05/2008: Proximal RCA 25%, mid RCA stent patent;  d. Lexiscan Myoview (9/14):  Low risk, small mild partially reversible basal inferior perfusion defect (cannot rule out small area of ischemia, but may be soft tissue attenuation), EF 61%, normal wall motion  . Hyperlipidemia     intol to statins and other chol agents due to elevated LFTs  . GERD  (gastroesophageal reflux disease)   . Fibromyalgia   . Asthma   . Pneumonia     "couple times; not recently" (12/06/2012)  . Chronic bronchitis     "used to get it yearly; not so much since pneu shots" (12/06/2012)  . Shortness of breath     "all the time last month or so" (12/06/2012)  . Anemia     "long time ago" (12/06/2012)  . H/O hiatal hernia   . Hepatitis     "not A, B, or C" (12/06/2012)  . Sinus headache     "alot" (12/06/2012)  . Migraines     "when I was younger" (12/06/2012)  . Arthritis     "right knee real bad; in my hands bad" (12/06/2012)  . Depression     "years ago" (12/06/2012)   Past Surgical History  Procedure Laterality Date  . Cardiac catheterization  04/2008; 05/2008  . Coronary angioplasty with stent placement  04/2008 12/06/2012    "1 + 1" (12/06/2012)  . Cholecystectomy    . Vaginal hysterectomy    . Tubal ligation     Family History  Problem Relation Age of Onset  . CAD Neg Hx    History  Substance Use Topics  . Smoking status: Former Smoker -- 0.50 packs/day for 30 years    Types: Cigarettes  . Smokeless tobacco: Never Used     Comment: 12/06/2012 "quit smoking cigarettes ~ 8 yr ago"  .  Alcohol Use: No   OB History   Grav Para Term Preterm Abortions TAB SAB Ect Mult Living                 Review of Systems  The patient denies anorexia, fever, weight loss,, vision loss, decreased hearing, hoarseness,  syncope, dyspnea on exertion, peripheral edema, balance deficits, hemoptysis, abdominal pain, melena, hematochezia, severe indigestion/heartburn, hematuria, incontinence, genital sores, muscle weakness, suspicious skin lesions, transient blindness, difficulty walking, depression, unusual weight change, abnormal bleeding, enlarged lymph nodes, angioedema, and breast masses.   Allergies  Ciprofloxacin; Codeine; and Statins  Home Medications   Current Outpatient Rx  Name  Route  Sig  Dispense  Refill  . albuterol (PROVENTIL HFA;VENTOLIN HFA) 108 (90  BASE) MCG/ACT inhaler   Inhalation   Inhale 2 puffs into the lungs every 6 (six) hours as needed for wheezing.         Marland Kitchen albuterol (PROVENTIL) (5 MG/ML) 0.5% nebulizer solution   Nebulization   Take 2.5 mg by nebulization every 6 (six) hours as needed for wheezing.         Marland Kitchen amLODipine (NORVASC) 2.5 MG tablet   Oral   Take 1 tablet (2.5 mg total) by mouth daily.   30 tablet   11   . aspirin 81 MG chewable tablet   Oral   Chew 1 tablet (81 mg total) by mouth daily.         Marland Kitchen buPROPion (WELLBUTRIN SR) 150 MG 12 hr tablet   Oral   Take 150 mg by mouth 2 (two) times daily.         . clopidogrel (PLAVIX) 75 MG tablet   Oral   Take 1 tablet (75 mg total) by mouth daily with breakfast.   30 tablet   11   . ergocalciferol (VITAMIN D2) 50000 UNITS capsule   Oral   Take 50,000 Units by mouth once a week. On Sunday         . fenofibrate (TRICOR) 145 MG tablet   Oral   Take 145 mg by mouth daily.         . fluticasone (FLOVENT HFA) 220 MCG/ACT inhaler   Inhalation   Inhale 1 puff into the lungs 2 (two) times daily.         Marland Kitchen HYDROcodone-acetaminophen (NORCO) 7.5-325 MG per tablet   Oral   Take 1 tablet by mouth every 6 (six) hours as needed for pain.         Marland Kitchen LORazepam (ATIVAN) 2 MG tablet   Oral   Take 2 mg by mouth every 6 (six) hours as needed for anxiety. At bedtime         . montelukast (SINGULAIR) 10 MG tablet   Oral   Take 10 mg by mouth at bedtime as needed (for allergies).          . nitroGLYCERIN (NITROSTAT) 0.4 MG SL tablet   Sublingual   Place 1 tablet (0.4 mg total) under the tongue every 5 (five) minutes as needed for chest pain.   25 tablet   12   . amoxicillin (AMOXIL) 500 MG capsule   Oral   Take 1 capsule (500 mg total) by mouth 3 (three) times daily.   21 capsule   0    BP 121/78  Pulse 83  Temp(Src) 98.4 F (36.9 C) (Oral)  Resp 25  Ht 5' 7"  (1.702 m)  Wt 160 lb (72.576 kg)  BMI 25.05 kg/m2  SpO2 99%  Physical Exam   Nursing note and vitals reviewed. Constitutional: She appears well-developed and well-nourished. No distress.  HENT:  Head: Normocephalic and atraumatic.  Right Ear: External ear normal.  Left Ear: External ear normal.  Nose: Rhinorrhea present. Right sinus exhibits frontal sinus tenderness. Right sinus exhibits no maxillary sinus tenderness. Left sinus exhibits frontal sinus tenderness. Left sinus exhibits no maxillary sinus tenderness.  Eyes: Pupils are equal, round, and reactive to light.  Neck: Normal range of motion. Neck supple.  Cardiovascular: Normal rate and regular rhythm.   Pulmonary/Chest: Effort normal and breath sounds normal. No respiratory distress. She has no decreased breath sounds.  Abdominal: Soft.  Neurological: She is alert.  Skin: Skin is warm and dry.    ED Course  Procedures (including critical care time) Labs Review Labs Reviewed  PRO B NATRIURETIC PEPTIDE - Abnormal; Notable for the following:    Pro B Natriuretic peptide (BNP) 419.1 (*)    All other components within normal limits  CBC WITH DIFFERENTIAL  BASIC METABOLIC PANEL  I-STAT TROPOININ, ED   Imaging Review Dg Chest 2 View  05/04/2013   CLINICAL DATA:  Chest pain  EXAM: CHEST  2 VIEW  COMPARISON:  Chest radiograph June 11, 2008 and chest CT June 12, 2008  FINDINGS: There is no edema or consolidation. The heart size and pulmonary vascularity are normal. No adenopathy. No bone lesions.  There is an apparent stent in the right coronary artery.  IMPRESSION: No edema or consolidation.   Electronically Signed   By: Lowella Grip M.D.   On: 05/04/2013 14:53     EKG Interpretation   Date/Time:  Thursday May 04 2013 12:37:10 EST Ventricular Rate:  89 PR Interval:  153 QRS Duration: 75 QT Interval:  378 QTC Calculation: 460 R Axis:   80 Text Interpretation:  Sinus rhythm Confirmed by HORTON  MD, Loma Sousa  ([id number]) on 05/04/2013 2:03:58 PM      MDM   Final diagnoses:  Angina at rest   Sinusitis    Patients labs have returned reassuring. Discussed case with Dr. Dina Rich who recommends I consult Cardiology. I spoke with Dr. Radford Pax who says that she will have office call patient to schedule follow-up appointment.  RX; Amoxicillin for Sinusitis  58 y.o.Azaylah L Creer's evaluation in the Emergency Department is complete. It has been determined that no acute conditions requiring further emergency intervention are present at this time. The patient/guardian have been advised of the diagnosis and plan. We have discussed signs and symptoms that warrant return to the ED, such as changes or worsening in symptoms.  Vital signs are stable at discharge. Filed Vitals:   05/04/13 1439  BP: 121/78  Pulse: 83  Temp:   Resp: 25    Patient/guardian has voiced understanding and agreed to follow-up with the PCP or specialist.     Linus Mako, PA-C 05/04/13 1516

## 2013-05-04 NOTE — ED Provider Notes (Signed)
Medical screening examination/treatment/procedure(s) were performed by non-physician practitioner and as supervising physician I was immediately available for consultation/collaboration.   EKG Interpretation   Date/Time:  Thursday May 04 2013 12:37:10 EST Ventricular Rate:  89 PR Interval:  153 QRS Duration: 75 QT Interval:  378 QTC Calculation: 460 R Axis:   80 Text Interpretation:  Sinus rhythm Confirmed by Dina Rich  MD, COURTNEY  ([id number]) on 05/04/2013 2:03:58 PM       Merryl Hacker, MD 05/04/13 [phone]

## 2013-05-04 NOTE — Discharge Instructions (Signed)
Chest Pain (Nonspecific) °It is often hard to give a specific diagnosis for the cause of chest pain. There is always a chance that your pain could be related to something serious, such as a heart attack or a blood clot in the lungs. You need to follow up with your caregiver for further evaluation. °CAUSES  °· Heartburn. °· Pneumonia or bronchitis. °· Anxiety or stress. °· Inflammation around your heart (pericarditis) or lung (pleuritis or pleurisy). °· A blood clot in the lung. °· A collapsed lung (pneumothorax). It can develop suddenly on its own (spontaneous pneumothorax) or from injury (trauma) to the chest. °· Shingles infection (herpes zoster virus). °The chest wall is composed of bones, muscles, and cartilage. Any of these can be the source of the pain. °· The bones can be bruised by injury. °· The muscles or cartilage can be strained by coughing or overwork. °· The cartilage can be affected by inflammation and become sore (costochondritis). °DIAGNOSIS  °Lab tests or other studies, such as X-rays, electrocardiography, stress testing, or cardiac imaging, may be needed to find the cause of your pain.  °TREATMENT  °· Treatment depends on what may be causing your chest pain. Treatment may include: °· Acid blockers for heartburn. °· Anti-inflammatory medicine. °· Pain medicine for inflammatory conditions. °· Antibiotics if an infection is present. °· You may be advised to change lifestyle habits. This includes stopping smoking and avoiding alcohol, caffeine, and chocolate. °· You may be advised to keep your head raised (elevated) when sleeping. This reduces the chance of acid going backward from your stomach into your esophagus. °· Most of the time, nonspecific chest pain will improve within 2 to 3 days with rest and mild pain medicine. °HOME CARE INSTRUCTIONS  °· If antibiotics were prescribed, take your antibiotics as directed. Finish them even if you start to feel better. °· For the next few days, avoid physical  activities that bring on chest pain. Continue physical activities as directed. °· Do not smoke. °· Avoid drinking alcohol. °· Only take over-the-counter or prescription medicine for pain, discomfort, or fever as directed by your caregiver. °· Follow your caregiver's suggestions for further testing if your chest pain does not go away. °· Keep any follow-up appointments you made. If you do not go to an appointment, you could develop lasting (chronic) problems with pain. If there is any problem keeping an appointment, you must call to reschedule. °SEEK MEDICAL CARE IF:  °· You think you are having problems from the medicine you are taking. Read your medicine instructions carefully. °· Your chest pain does not go away, even after treatment. °· You develop a rash with blisters on your chest. °SEEK IMMEDIATE MEDICAL CARE IF:  °· You have increased chest pain or pain that spreads to your arm, neck, jaw, back, or abdomen. °· You develop shortness of breath, an increasing cough, or you are coughing up blood. °· You have severe back or abdominal pain, feel nauseous, or vomit. °· You develop severe weakness, fainting, or chills. °· You have a fever. °THIS IS AN EMERGENCY. Do not wait to see if the pain will go away. Get medical help at once. Call your local emergency services (911 in [place] Do not drive yourself to the hospital. °MAKE SURE YOU:  °· Understand these instructions. °· Will watch your condition. °· Will get help right away if you are not doing well or get worse. °Document Released: 11/26/2004 Document Revised: 05/11/2011 Document Reviewed: 09/22/2007 °ExitCare® Patient Information ©2014 [employer]   [employer]. ° °

## 2013-05-04 NOTE — ED Notes (Signed)
Pt return from [facility]  

## 2013-05-05 ENCOUNTER — Encounter: Payer: Self-pay | Admitting: Cardiovascular Disease

## 2013-05-05 ENCOUNTER — Encounter: Payer: Self-pay | Admitting: Radiology

## 2013-05-05 ENCOUNTER — Encounter: Payer: Self-pay | Admitting: *Deleted

## 2013-05-05 ENCOUNTER — Encounter (INDEPENDENT_AMBULATORY_CARE_PROVIDER_SITE_OTHER): Payer: Managed Care, Other (non HMO)

## 2013-05-05 ENCOUNTER — Telehealth: Payer: Self-pay | Admitting: *Deleted

## 2013-05-05 ENCOUNTER — Ambulatory Visit (INDEPENDENT_AMBULATORY_CARE_PROVIDER_SITE_OTHER): Payer: Managed Care, Other (non HMO) | Admitting: Cardiovascular Disease

## 2013-05-05 VITALS — BP 149/89 | HR 98 | Ht 67.0 in | Wt 167.4 lb

## 2013-05-05 DIAGNOSIS — R002 Palpitations: Secondary | ICD-10-CM

## 2013-05-05 DIAGNOSIS — E785 Hyperlipidemia, unspecified: Secondary | ICD-10-CM

## 2013-05-05 DIAGNOSIS — I2510 Atherosclerotic heart disease of native coronary artery without angina pectoris: Secondary | ICD-10-CM

## 2013-05-05 DIAGNOSIS — R079 Chest pain, unspecified: Secondary | ICD-10-CM

## 2013-05-05 MED ORDER — ISOSORBIDE MONONITRATE ER 30 MG PO TB24: 30.0000 mg | ORAL_TABLET | Freq: Every day | ORAL | Status: DC

## 2013-05-05 NOTE — Telephone Encounter (Signed)
C/o cp off and on x 1 week. Pt has 4 episodes/ Pain will last as long as 2 hours, pain 7/10.  Pain is mid chest and down left arm. Pain is not like any pain she has had prior. With her stents she had no cp only SOB. GERD pain diff than this. Pt uses her pulse ox machine and states her HR is 137 bpm during episodes of pain. Pt has not tried nitro. Pt was seen in ED yesterday but states she was not currently having cp. Per Dr Angelena Form pt will be seen today, pt accepted app.

## 2013-05-05 NOTE — Progress Notes (Signed)
Patient ID: [name], female   DOB: [birth year], 58 y.o.   MRN: [id number] E cardio 48hr holter applied

## 2013-05-05 NOTE — Progress Notes (Signed)
History of Present Illness: 58 y.o. female with history of asthma, depression, GERD, fibromyalgia and CAD who is here today for cardiac follow up. Nuclear study 12/09: EF 59%, no ischemia. [place] 04/2008 demonstrated 80% stenosis in the mid RCA treated with a Xience DES. Patient was noted to have possible SVT during cardiac rehabilitation 05/2008. Re-look LHC 05/2008: Proximal RCA 25%, mid RCA stent patent. Holter 03/2009: NSR, PACs and PVCs. Liver biopsy 2011 reportedly normal (steatohepatitis). She has not tolerated statins in the past. Last seen 11/03/12 by Richardson Dopp, PA-C. She had been off of all cardiac meds. She noted significant fatigue, dyspnea and chest pain. Stress myoview arranged on  11/21/12 which showed a reversible basal inferior perfusion defect. She described class III angina on her f/u in our office 12/02/12. Cardiac cath 12/06/12 with severe stenosis in the mid RCA just before the old stent which also had restenosis. I treated the entire segment with a 2.75 x 28 mm Promus Premier DES. This was post-dilated to 3.0 mm.   She is here today for follow up. She began to have chest pain one week ago. The pain started while sitting and radiated from her chest into her left arm. She has had multiple episodes over the last week. She was seen in the ED at [facility] yesterday and had normal EKG and normal troponin. Dr. Radford Pax was consulted and recommended f/u in our office. She feels that her heart races while she is having the severe pain in her left arm. She does not know if the pain starts first or the tachycardia. The episodes of pain are very similar to her prior angina. Also makes her feel dyspneic.   Primary Care Physician: Kathryne Hitch  Last Lipid Profile: Followed in primary care  Past Medical History  Diagnosis Date  . Coronary artery disease     a. Nuc Study 12/09:  no ischemia, EF 59%;  b. s/p Xience DES to [facility] 04/2008;  c.  LHC 05/2008: Proximal RCA 25%, mid RCA stent patent;  d.  Lexiscan Myoview (9/14):  Low risk, small mild partially reversible basal inferior perfusion defect (cannot rule out small area of ischemia, but may be soft tissue attenuation), EF 61%, normal wall motion  . Hyperlipidemia     intol to statins and other chol agents due to elevated LFTs  . GERD (gastroesophageal reflux disease)   . Fibromyalgia   . Asthma   . Pneumonia     "couple times; not recently" (12/06/2012)  . Chronic bronchitis     "used to get it yearly; not so much since pneu shots" (12/06/2012)  . Shortness of breath     "all the time last month or so" (12/06/2012)  . Anemia     "long time ago" (12/06/2012)  . H/O hiatal hernia   . Hepatitis     "not A, B, or C" (12/06/2012)  . Sinus headache     "alot" (12/06/2012)  . Migraines     "when I was younger" (12/06/2012)  . Arthritis     "right knee real bad; in my hands bad" (12/06/2012)  . Depression     "years ago" (12/06/2012)    Past Surgical History  Procedure Laterality Date  . Cardiac catheterization  04/2008; 05/2008  . Coronary angioplasty with stent placement  04/2008 12/06/2012    "1 + 1" (12/06/2012)  . Cholecystectomy    . Vaginal hysterectomy    . Tubal ligation      Current  Outpatient Prescriptions  Medication Sig Dispense Refill  . albuterol (PROVENTIL HFA;VENTOLIN HFA) 108 (90 BASE) MCG/ACT inhaler Inhale 2 puffs into the lungs every 6 (six) hours as needed for wheezing.      Marland Kitchen albuterol (PROVENTIL) (5 MG/ML) 0.5% nebulizer solution Take 2.5 mg by nebulization every 6 (six) hours as needed for wheezing.      Marland Kitchen amLODipine (NORVASC) 2.5 MG tablet Take 1 tablet (2.5 mg total) by mouth daily.  30 tablet  11  . amoxicillin (AMOXIL) 500 MG capsule Take 1 capsule (500 mg total) by mouth 3 (three) times daily.  21 capsule  0  . aspirin 81 MG chewable tablet Chew 1 tablet (81 mg total) by mouth daily.      Marland Kitchen buPROPion (WELLBUTRIN SR) 150 MG 12 hr tablet Take 150 mg by mouth 2 (two) times daily.      . clopidogrel (PLAVIX)  75 MG tablet Take 1 tablet (75 mg total) by mouth daily with breakfast.  30 tablet  11  . ergocalciferol (VITAMIN D2) 50000 UNITS capsule Take 50,000 Units by mouth once a week. On Sunday      . fenofibrate (TRICOR) 145 MG tablet Take 145 mg by mouth daily.      . fluticasone (FLOVENT HFA) 220 MCG/ACT inhaler Inhale 1 puff into the lungs 2 (two) times daily.      Marland Kitchen HYDROcodone-acetaminophen (NORCO) 7.5-325 MG per tablet Take 1 tablet by mouth every 6 (six) hours as needed for pain.      Marland Kitchen LORazepam (ATIVAN) 2 MG tablet Take 2 mg by mouth every 6 (six) hours as needed for anxiety. At bedtime      . montelukast (SINGULAIR) 10 MG tablet Take 10 mg by mouth at bedtime as needed (for allergies).       . nitroGLYCERIN (NITROSTAT) 0.4 MG SL tablet Place 1 tablet (0.4 mg total) under the tongue every 5 (five) minutes as needed for chest pain.  25 tablet  12   No current facility-administered medications for this visit.    Allergies  Allergen Reactions  . Ciprofloxacin Hives  . Codeine Hives and Itching  . Statins     Liver enzymes    History   Social History  . Marital Status: Married    Spouse Name: N/A    Number of Children: N/A  . Years of Education: N/A   Occupational History  . Not on file.   Social History Main Topics  . Smoking status: Former Smoker -- 0.50 packs/day for 30 years    Types: Cigarettes  . Smokeless tobacco: Never Used     Comment: 12/06/2012 "quit smoking cigarettes ~ 8 yr ago"  . Alcohol Use: No  . Drug Use: No  . Sexual Activity: Yes   Other Topics Concern  . Not on file   Social History Narrative  . No narrative on file    Family History: No CAD  Review of Systems:  As stated in the HPI and otherwise negative.   BP 149/89  Pulse 98  Ht 5' 7"  (1.702 m)  Wt 167 lb 6.4 oz (75.932 kg)  BMI 26.21 kg/m2  Physical Examination: General: Well developed, well nourished, NAD HEENT: OP clear, mucus membranes moist SKIN: warm, dry. No rashes. Neuro: No  focal deficits Musculoskeletal: Muscle strength 5/5 all ext Psychiatric: Mood and affect normal Neck: No JVD, no carotid bruits, no thyromegaly, no lymphadenopathy. Lungs:Clear bilaterally, no wheezes, rhonci, crackles Cardiovascular: Regular rate and rhythm. No murmurs, gallops or  rubs. Abdomen:Soft. Bowel sounds present. Non-tender.  Extremities: No lower extremity edema. Pulses are 2 + in the bilateral DP/PT.  Stress myoview 11/21/12: Stress Procedure: The patient received IV Lexiscan 0.4 mg over 15-seconds. Technetium 87mSestamibi injected at 30-seconds. This patient had sob and felt funny with the Lexiscan injection. Quantitative spect images were obtained after a 45 minute delay.  Stress ECG: No significant change from baseline ECG  QPS  Raw Data Images: Normal; no motion artifact; normal heart/lung ratio.  Stress Images: Small, mild basal inferior perfusion defect.  Rest Images: Small, mild basal inferior perfusion defect. Defect is somewhat less prominent than with stress.  Subtraction (SDS): Primarily fixed small mild basal inferior perfusion defect.  Transient Ischemic Dilatation (Normal <1.22): n/a  Lung/Heart Ratio (Normal <0.45): 0.37  Quantitative Gated Spect Images  QGS EDV: 74 ml  QGS ESV: 29 ml  Impression  Exercise Capacity: Lexiscan with no exercise.  BP Response: Normal blood pressure response.  Clinical Symptoms: Short of breath.  ECG Impression: No significant ST segment change suggestive of ischemia.  Comparison with Prior Nuclear Study: No images to compare  Overall Impression: Low risk stress nuclear study with a small, mild partially reversible basal inferior perfusion defect. Cannot rule out small area of ischemia but may be soft tissue attenuation. .  LV Ejection Fraction: 61%. LV Wall Motion: NL LV Function; NL Wall Motion  Cardiac cath 12/06/12: Left main: No obstructive disease.  Left Anterior Descending Artery: Large caliber vessel that courses to the  apex. The proximal vessel has a 30% stenosis. The mid and distal vessel has diffuse plaque disease. The moderate caliber diagonal branch has mild plaque disease.  Circumflex Artery: Large caliber vessel with two small caliber obtuse marginal branches followed by a third moderate caliber, bifurcating obtuse marginal branch. No obstructive disease.  Right Coronary Artery: Large, dominant vessel with 20% proximal stenosis. The mid vessel has a focal 95% stenosis just before the old stent. The distal segment of the stent has 60% restenosis. The PDA is small to moderate in caliber with ostial 60% stenosis.  Left Ventricular Angiogram: LVEF=55-60%   EKG: NSR, rate 98 bpm. Non-specific ST changes.   Assessment and Plan:   1. CAD: s/p recent PCI with stent mid RCA October 2014. Now with recurrent chest pain concerning for unstable angina. [facility] Risks and benefits reviewed with patient and she agrees to proceed. She had labs yesterday. Does not need INR as she had normal INR October and she is not on coumadin. Continue ASA and Plavix. She does not tolerate statins or beta blockers. Will start Imdur 30 mg daily over the weekend. She has NTG to use prn. She is instructed to call EMS if she has recurrence of severe chest pain over the weekend.   2. Palpitations: Will arrange 48 hour monitor  3. Chest pain: See above  4. Hyperlipidemia: She does not tolerate statins. Lipids are followed in primary care

## 2013-05-05 NOTE — Patient Instructions (Addendum)
Your physician recommends that you schedule a follow-up appointment in:  About 8 weeks.  Scheduled for June 27, 2013 at 3:15  Your physician has recommended that you wear a holter monitor. Holter monitors are medical devices that record the heart's electrical activity. Doctors most often use these monitors to diagnose arrhythmias. Arrhythmias are problems with the speed or rhythm of the heartbeat. The monitor is a small, portable device. You can wear one while you do your normal daily activities. This is usually used to diagnose what is causing palpitations/syncope (passing out).   Your physician has requested that you have a cardiac catheterization. Cardiac catheterization is used to diagnose and/or treat various heart conditions. Doctors may recommend this procedure for a number of different reasons. The most common reason is to evaluate chest pain. Chest pain can be a symptom of coronary artery disease (CAD), and cardiac catheterization can show whether plaque is narrowing or blocking your heart's arteries. This procedure is also used to evaluate the valves, as well as measure the blood flow and oxygen levels in different parts of your heart. For further information please visit [email or web address]. Please follow instruction sheet, as given. Scheduled for May 08, 2013   Your physician has recommended you make the following change in your medication:  Start Imdur 30 mg by mouth daily

## 2013-05-08 ENCOUNTER — Encounter (HOSPITAL_COMMUNITY): Payer: Self-pay | Admitting: General Practice

## 2013-05-08 ENCOUNTER — Encounter (HOSPITAL_COMMUNITY)
Admission: RE | Disposition: A | Discharge status: Home / Self Care | Payer: Managed Care, Other (non HMO) | Source: Ambulatory Visit | Attending: Cardiovascular Disease

## 2013-05-08 ENCOUNTER — Ambulatory Visit (HOSPITAL_COMMUNITY)
Admission: RE | Admit: 2013-05-08 | Discharge: 2013-05-09 | Disposition: A | Discharge status: Home / Self Care | Payer: Managed Care, Other (non HMO) | Source: Ambulatory Visit | Attending: Cardiovascular Disease | Admitting: Cardiovascular Disease

## 2013-05-08 DIAGNOSIS — I2589 Other forms of chronic ischemic heart disease: Secondary | ICD-10-CM | POA: Insufficient documentation

## 2013-05-08 DIAGNOSIS — J45909 Unspecified asthma, uncomplicated: Secondary | ICD-10-CM | POA: Insufficient documentation

## 2013-05-08 DIAGNOSIS — R072 Precordial pain: Secondary | ICD-10-CM

## 2013-05-08 DIAGNOSIS — T82897A Other specified complication of cardiac prosthetic devices, implants and grafts, initial encounter: Secondary | ICD-10-CM | POA: Insufficient documentation

## 2013-05-08 DIAGNOSIS — E785 Hyperlipidemia, unspecified: Secondary | ICD-10-CM | POA: Diagnosis present

## 2013-05-08 DIAGNOSIS — IMO0002 Reserved for concepts with insufficient information to code with codable children: Secondary | ICD-10-CM

## 2013-05-08 DIAGNOSIS — K589 Irritable bowel syndrome without diarrhea: Secondary | ICD-10-CM | POA: Diagnosis present

## 2013-05-08 DIAGNOSIS — F3289 Other specified depressive episodes: Secondary | ICD-10-CM | POA: Diagnosis present

## 2013-05-08 DIAGNOSIS — Z9861 Coronary angioplasty status: Secondary | ICD-10-CM | POA: Insufficient documentation

## 2013-05-08 DIAGNOSIS — Z87891 Personal history of nicotine dependence: Secondary | ICD-10-CM | POA: Insufficient documentation

## 2013-05-08 DIAGNOSIS — M19049 Primary osteoarthritis, unspecified hand: Secondary | ICD-10-CM | POA: Insufficient documentation

## 2013-05-08 DIAGNOSIS — F329 Major depressive disorder, single episode, unspecified: Secondary | ICD-10-CM | POA: Diagnosis present

## 2013-05-08 DIAGNOSIS — M1710 Unilateral primary osteoarthritis, unspecified knee: Secondary | ICD-10-CM | POA: Insufficient documentation

## 2013-05-08 DIAGNOSIS — I2510 Atherosclerotic heart disease of native coronary artery without angina pectoris: Secondary | ICD-10-CM | POA: Diagnosis present

## 2013-05-08 DIAGNOSIS — Y831 Surgical operation with implant of artificial internal device as the cause of abnormal reaction of the patient, or of later complication, without mention of misadventure at the time of the procedure: Secondary | ICD-10-CM | POA: Insufficient documentation

## 2013-05-08 DIAGNOSIS — E782 Mixed hyperlipidemia: Secondary | ICD-10-CM | POA: Insufficient documentation

## 2013-05-08 DIAGNOSIS — IMO0001 Reserved for inherently not codable concepts without codable children: Secondary | ICD-10-CM | POA: Diagnosis present

## 2013-05-08 DIAGNOSIS — K219 Gastro-esophageal reflux disease without esophagitis: Secondary | ICD-10-CM | POA: Diagnosis present

## 2013-05-08 DIAGNOSIS — I200 Unstable angina: Secondary | ICD-10-CM | POA: Diagnosis present

## 2013-05-08 DIAGNOSIS — K759 Inflammatory liver disease, unspecified: Secondary | ICD-10-CM | POA: Insufficient documentation

## 2013-05-08 HISTORY — DX: Essential (primary) hypertension: I10

## 2013-05-08 HISTORY — PX: LEFT HEART CATHETERIZATION WITH CORONARY ANGIOGRAM: SHX5451

## 2013-05-08 HISTORY — PX: PERCUTANEOUS CORONARY STENT INTERVENTION (PCI-S): SHX5485

## 2013-05-08 HISTORY — PX: CORONARY STENT PLACEMENT: SHX1402

## 2013-05-08 LAB — PROTIME-INR
INR: 0.93 (ref 0.00–1.49)
Prothrombin Time: 12.3 seconds (ref 11.6–15.2)

## 2013-05-08 LAB — POCT ACTIVATED CLOTTING TIME: ACTIVATED CLOTTING TIME: 387 s

## 2013-05-08 SURGERY — LEFT HEART CATHETERIZATION WITH CORONARY ANGIOGRAM
Anesthesia: LOCAL | Site: Groin

## 2013-05-08 MED ORDER — ALBUTEROL SULFATE (2.5 MG/3ML) 0.083% IN NEBU: 3.0000 mL | INHALATION_SOLUTION | Freq: Four times a day (QID) | RESPIRATORY_TRACT | Status: DC | PRN

## 2013-05-08 MED ORDER — NITROGLYCERIN 0.4 MG SL SUBL
0.4000 mg | SUBLINGUAL_TABLET | SUBLINGUAL | Status: DC | PRN
  Administered 2013-05-09: 07:00:00 0.4 mg via SUBLINGUAL
  Filled 2013-05-08: qty 1

## 2013-05-08 MED ORDER — AMLODIPINE BESYLATE 2.5 MG PO TABS
2.5000 mg | ORAL_TABLET | Freq: Every day | ORAL | Status: DC
  Administered 2013-05-09: 09:00:00 2.5 mg via ORAL
  Filled 2013-05-08: qty 1

## 2013-05-08 MED ORDER — ASPIRIN 81 MG PO CHEW
81.0000 mg | CHEWABLE_TABLET | Freq: Every day | ORAL | Status: DC
  Administered 2013-05-09: 81 mg via ORAL
  Filled 2013-05-08: qty 1

## 2013-05-08 MED ORDER — HEPARIN (PORCINE) IN NACL 2-0.9 UNIT/ML-% IJ SOLN
INTRAMUSCULAR | Status: AC
  Filled 2013-05-08: qty 1000

## 2013-05-08 MED ORDER — ASPIRIN 81 MG PO CHEW: 81.0000 mg | CHEWABLE_TABLET | ORAL | Status: DC

## 2013-05-08 MED ORDER — SODIUM CHLORIDE 0.9 % IV SOLN: 250.0000 mL | INTRAVENOUS | Status: DC | PRN

## 2013-05-08 MED ORDER — BUPROPION HCL ER (SR) 150 MG PO TB12
150.0000 mg | ORAL_TABLET | Freq: Two times a day (BID) | ORAL | Status: DC
  Administered 2013-05-08 – 2013-05-09 (×2): 150 mg via ORAL
  Filled 2013-05-08 (×3): qty 1

## 2013-05-08 MED ORDER — FLUTICASONE PROPIONATE HFA 220 MCG/ACT IN AERO
1.0000 | INHALATION_SPRAY | Freq: Two times a day (BID) | RESPIRATORY_TRACT | Status: DC
  Administered 2013-05-08 – 2013-05-09 (×2): 1 via RESPIRATORY_TRACT
  Filled 2013-05-08: qty 12

## 2013-05-08 MED ORDER — LISINOPRIL 5 MG PO TABS
5.0000 mg | ORAL_TABLET | Freq: Every day | ORAL | Status: DC
  Administered 2013-05-08 – 2013-05-09 (×2): 5 mg via ORAL
  Filled 2013-05-08 (×2): qty 1

## 2013-05-08 MED ORDER — BIVALIRUDIN 250 MG IV SOLR
INTRAVENOUS | Status: AC
Start: 2013-05-08 — End: 2013-05-08
  Filled 2013-05-08: qty 250

## 2013-05-08 MED ORDER — MIDAZOLAM HCL 2 MG/2ML IJ SOLN
INTRAMUSCULAR | Status: AC
  Filled 2013-05-08: qty 2

## 2013-05-08 MED ORDER — DIAZEPAM 5 MG PO TABS
5.0000 mg | ORAL_TABLET | ORAL | Status: AC
  Administered 2013-05-08: 5 mg via ORAL
  Filled 2013-05-08: qty 1

## 2013-05-08 MED ORDER — FENOFIBRATE 160 MG PO TABS
160.0000 mg | ORAL_TABLET | Freq: Every day | ORAL | Status: DC
  Administered 2013-05-08 – 2013-05-09 (×2): 160 mg via ORAL
  Filled 2013-05-08 (×2): qty 1

## 2013-05-08 MED ORDER — SODIUM CHLORIDE 0.9 % IJ SOLN: 3.0000 mL | INTRAMUSCULAR | Status: DC | PRN

## 2013-05-08 MED ORDER — SODIUM CHLORIDE 0.9 % IV SOLN
INTRAVENOUS | Status: DC
  Administered 2013-05-08: 11:00:00 via INTRAVENOUS

## 2013-05-08 MED ORDER — MORPHINE SULFATE 10 MG/ML IJ SOLN
INTRAMUSCULAR | Status: AC
  Filled 2013-05-08: qty 1

## 2013-05-08 MED ORDER — SODIUM CHLORIDE 0.9 % IV SOLN: INTRAVENOUS | Status: AC

## 2013-05-08 MED ORDER — SODIUM CHLORIDE 0.9 % IJ SOLN: 3.0000 mL | Freq: Two times a day (BID) | INTRAMUSCULAR | Status: DC

## 2013-05-08 MED ORDER — LIDOCAINE HCL (PF) 1 % IJ SOLN
INTRAMUSCULAR | Status: AC
  Filled 2013-05-08: qty 30

## 2013-05-08 MED ORDER — CLOPIDOGREL BISULFATE 75 MG PO TABS
75.0000 mg | ORAL_TABLET | Freq: Every day | ORAL | Status: DC
  Administered 2013-05-09: 75 mg via ORAL
  Filled 2013-05-08: qty 1

## 2013-05-08 MED ORDER — NITROGLYCERIN 0.2 MG/ML ON CALL CATH LAB
INTRAVENOUS | Status: AC
  Filled 2013-05-08: qty 1

## 2013-05-08 MED ORDER — AMOXICILLIN 500 MG PO CAPS
500.0000 mg | ORAL_CAPSULE | Freq: Two times a day (BID) | ORAL | Status: DC
  Administered 2013-05-08 – 2013-05-09 (×2): 500 mg via ORAL
  Filled 2013-05-08 (×3): qty 1

## 2013-05-08 MED ORDER — ACETAMINOPHEN 325 MG PO TABS
650.0000 mg | ORAL_TABLET | Freq: Four times a day (QID) | ORAL | Status: DC | PRN
Start: 2013-05-08 — End: 2013-05-09
  Administered 2013-05-08 – 2013-05-09 (×3): 650 mg via ORAL
  Filled 2013-05-08 (×3): qty 2

## 2013-05-08 MED ORDER — LORAZEPAM 1 MG PO TABS
2.0000 mg | ORAL_TABLET | Freq: Once | ORAL | Status: AC
  Administered 2013-05-08: 2 mg via ORAL
  Filled 2013-05-08: qty 2

## 2013-05-08 MED ORDER — FENTANYL CITRATE 0.05 MG/ML IJ SOLN
INTRAMUSCULAR | Status: AC
  Filled 2013-05-08: qty 2

## 2013-05-08 MED ORDER — ISOSORBIDE MONONITRATE ER 30 MG PO TB24
30.0000 mg | ORAL_TABLET | Freq: Every day | ORAL | Status: DC
  Administered 2013-05-08 – 2013-05-09 (×2): 30 mg via ORAL
  Filled 2013-05-08 (×2): qty 1

## 2013-05-08 MED ORDER — MONTELUKAST SODIUM 10 MG PO TABS
10.0000 mg | ORAL_TABLET | Freq: Every evening | ORAL | Status: DC | PRN
  Filled 2013-05-08: qty 1

## 2013-05-08 NOTE — H&P (View-Only) (Signed)
History of Present Illness: 58 y.o. female with history of asthma, depression, GERD, fibromyalgia and CAD who is here today for cardiac follow up. Nuclear study 12/09: EF 59%, no ischemia. [place] 04/2008 demonstrated 80% stenosis in the mid RCA treated with a Xience DES. Patient was noted to have possible SVT during cardiac rehabilitation 05/2008. Re-look LHC 05/2008: Proximal RCA 25%, mid RCA stent patent. Holter 03/2009: NSR, PACs and PVCs. Liver biopsy 2011 reportedly normal (steatohepatitis). She has not tolerated statins in the past. Last seen 11/03/12 by Richardson Dopp, PA-C. She had been off of all cardiac meds. She noted significant fatigue, dyspnea and chest pain. Stress myoview arranged on  11/21/12 which showed a reversible basal inferior perfusion defect. She described class III angina on her f/u in our office 12/02/12. Cardiac cath 12/06/12 with severe stenosis in the mid RCA just before the old stent which also had restenosis. I treated the entire segment with a 2.75 x 28 mm Promus Premier DES. This was post-dilated to 3.0 mm.   She is here today for follow up. She began to have chest pain one week ago. The pain started while sitting and radiated from her chest into her left arm. She has had multiple episodes over the last week. She was seen in the ED at [facility] yesterday and had normal EKG and normal troponin. Dr. Radford Pax was consulted and recommended f/u in our office. She feels that her heart races while she is having the severe pain in her left arm. She does not know if the pain starts first or the tachycardia. The episodes of pain are very similar to her prior angina. Also makes her feel dyspneic.   Primary Care Physician: Kathryne Hitch  Last Lipid Profile: Followed in primary care  Past Medical History  Diagnosis Date  . Coronary artery disease     a. Nuc Study 12/09:  no ischemia, EF 59%;  b. s/p Xience DES to [facility] 04/2008;  c.  LHC 05/2008: Proximal RCA 25%, mid RCA stent patent;  d.  Lexiscan Myoview (9/14):  Low risk, small mild partially reversible basal inferior perfusion defect (cannot rule out small area of ischemia, but may be soft tissue attenuation), EF 61%, normal wall motion  . Hyperlipidemia     intol to statins and other chol agents due to elevated LFTs  . GERD (gastroesophageal reflux disease)   . Fibromyalgia   . Asthma   . Pneumonia     "couple times; not recently" (12/06/2012)  . Chronic bronchitis     "used to get it yearly; not so much since pneu shots" (12/06/2012)  . Shortness of breath     "all the time last month or so" (12/06/2012)  . Anemia     "long time ago" (12/06/2012)  . H/O hiatal hernia   . Hepatitis     "not A, B, or C" (12/06/2012)  . Sinus headache     "alot" (12/06/2012)  . Migraines     "when I was younger" (12/06/2012)  . Arthritis     "right knee real bad; in my hands bad" (12/06/2012)  . Depression     "years ago" (12/06/2012)    Past Surgical History  Procedure Laterality Date  . Cardiac catheterization  04/2008; 05/2008  . Coronary angioplasty with stent placement  04/2008 12/06/2012    "1 + 1" (12/06/2012)  . Cholecystectomy    . Vaginal hysterectomy    . Tubal ligation      Current  Outpatient Prescriptions  Medication Sig Dispense Refill  . albuterol (PROVENTIL HFA;VENTOLIN HFA) 108 (90 BASE) MCG/ACT inhaler Inhale 2 puffs into the lungs every 6 (six) hours as needed for wheezing.      Marland Kitchen albuterol (PROVENTIL) (5 MG/ML) 0.5% nebulizer solution Take 2.5 mg by nebulization every 6 (six) hours as needed for wheezing.      Marland Kitchen amLODipine (NORVASC) 2.5 MG tablet Take 1 tablet (2.5 mg total) by mouth daily.  30 tablet  11  . amoxicillin (AMOXIL) 500 MG capsule Take 1 capsule (500 mg total) by mouth 3 (three) times daily.  21 capsule  0  . aspirin 81 MG chewable tablet Chew 1 tablet (81 mg total) by mouth daily.      Marland Kitchen buPROPion (WELLBUTRIN SR) 150 MG 12 hr tablet Take 150 mg by mouth 2 (two) times daily.      . clopidogrel (PLAVIX)  75 MG tablet Take 1 tablet (75 mg total) by mouth daily with breakfast.  30 tablet  11  . ergocalciferol (VITAMIN D2) 50000 UNITS capsule Take 50,000 Units by mouth once a week. On Sunday      . fenofibrate (TRICOR) 145 MG tablet Take 145 mg by mouth daily.      . fluticasone (FLOVENT HFA) 220 MCG/ACT inhaler Inhale 1 puff into the lungs 2 (two) times daily.      Marland Kitchen HYDROcodone-acetaminophen (NORCO) 7.5-325 MG per tablet Take 1 tablet by mouth every 6 (six) hours as needed for pain.      Marland Kitchen LORazepam (ATIVAN) 2 MG tablet Take 2 mg by mouth every 6 (six) hours as needed for anxiety. At bedtime      . montelukast (SINGULAIR) 10 MG tablet Take 10 mg by mouth at bedtime as needed (for allergies).       . nitroGLYCERIN (NITROSTAT) 0.4 MG SL tablet Place 1 tablet (0.4 mg total) under the tongue every 5 (five) minutes as needed for chest pain.  25 tablet  12   No current facility-administered medications for this visit.    Allergies  Allergen Reactions  . Ciprofloxacin Hives  . Codeine Hives and Itching  . Statins     Liver enzymes    History   Social History  . Marital Status: Married    Spouse Name: N/A    Number of Children: N/A  . Years of Education: N/A   Occupational History  . Not on file.   Social History Main Topics  . Smoking status: Former Smoker -- 0.50 packs/day for 30 years    Types: Cigarettes  . Smokeless tobacco: Never Used     Comment: 12/06/2012 "quit smoking cigarettes ~ 8 yr ago"  . Alcohol Use: No  . Drug Use: No  . Sexual Activity: Yes   Other Topics Concern  . Not on file   Social History Narrative  . No narrative on file    Family History: No CAD  Review of Systems:  As stated in the HPI and otherwise negative.   BP 149/89  Pulse 98  Ht 5' 7"  (1.702 m)  Wt 167 lb 6.4 oz (75.932 kg)  BMI 26.21 kg/m2  Physical Examination: General: Well developed, well nourished, NAD HEENT: OP clear, mucus membranes moist SKIN: warm, dry. No rashes. Neuro: No  focal deficits Musculoskeletal: Muscle strength 5/5 all ext Psychiatric: Mood and affect normal Neck: No JVD, no carotid bruits, no thyromegaly, no lymphadenopathy. Lungs:Clear bilaterally, no wheezes, rhonci, crackles Cardiovascular: Regular rate and rhythm. No murmurs, gallops or  rubs. Abdomen:Soft. Bowel sounds present. Non-tender.  Extremities: No lower extremity edema. Pulses are 2 + in the bilateral DP/PT.  Stress myoview 11/21/12: Stress Procedure: The patient received IV Lexiscan 0.4 mg over 15-seconds. Technetium 51mSestamibi injected at 30-seconds. This patient had sob and felt funny with the Lexiscan injection. Quantitative spect images were obtained after a 45 minute delay.  Stress ECG: No significant change from baseline ECG  QPS  Raw Data Images: Normal; no motion artifact; normal heart/lung ratio.  Stress Images: Small, mild basal inferior perfusion defect.  Rest Images: Small, mild basal inferior perfusion defect. Defect is somewhat less prominent than with stress.  Subtraction (SDS): Primarily fixed small mild basal inferior perfusion defect.  Transient Ischemic Dilatation (Normal <1.22): n/a  Lung/Heart Ratio (Normal <0.45): 0.37  Quantitative Gated Spect Images  QGS EDV: 74 ml  QGS ESV: 29 ml  Impression  Exercise Capacity: Lexiscan with no exercise.  BP Response: Normal blood pressure response.  Clinical Symptoms: Short of breath.  ECG Impression: No significant ST segment change suggestive of ischemia.  Comparison with Prior Nuclear Study: No images to compare  Overall Impression: Low risk stress nuclear study with a small, mild partially reversible basal inferior perfusion defect. Cannot rule out small area of ischemia but may be soft tissue attenuation. .  LV Ejection Fraction: 61%. LV Wall Motion: NL LV Function; NL Wall Motion  Cardiac cath 12/06/12: Left main: No obstructive disease.  Left Anterior Descending Artery: Large caliber vessel that courses to the  apex. The proximal vessel has a 30% stenosis. The mid and distal vessel has diffuse plaque disease. The moderate caliber diagonal branch has mild plaque disease.  Circumflex Artery: Large caliber vessel with two small caliber obtuse marginal branches followed by a third moderate caliber, bifurcating obtuse marginal branch. No obstructive disease.  Right Coronary Artery: Large, dominant vessel with 20% proximal stenosis. The mid vessel has a focal 95% stenosis just before the old stent. The distal segment of the stent has 60% restenosis. The PDA is small to moderate in caliber with ostial 60% stenosis.  Left Ventricular Angiogram: LVEF=55-60%   EKG: NSR, rate 98 bpm. Non-specific ST changes.   Assessment and Plan:   1. CAD: s/p recent PCI with stent mid RCA October 2014. Now with recurrent chest pain concerning for unstable angina. [facility] Risks and benefits reviewed with patient and she agrees to proceed. She had labs yesterday. Does not need INR as she had normal INR October and she is not on coumadin. Continue ASA and Plavix. She does not tolerate statins or beta blockers. Will start Imdur 30 mg daily over the weekend. She has NTG to use prn. She is instructed to call EMS if she has recurrence of severe chest pain over the weekend.   2. Palpitations: Will arrange 48 hour monitor  3. Chest pain: See above  4. Hyperlipidemia: She does not tolerate statins. Lipids are followed in primary care

## 2013-05-08 NOTE — Interval H&P Note (Signed)
History and Physical Interval Note:  05/08/2013 1:07 PM  [name]  has presented today for cardiac cath with the diagnosis of Chest Pain, CAD  The various methods of treatment have been discussed with the patient and family. After consideration of risks, benefits and other options for treatment, the patient has consented to  Procedure(s): LEFT HEART CATHETERIZATION WITH CORONARY ANGIOGRAM (N/A) as a surgical intervention .  The patient's history has been reviewed, patient examined, no change in status, stable for surgery.  I have reviewed the patient's chart and labs.  Questions were answered to the patient's satisfaction.    Cath Lab Visit (complete for each Cath Lab visit)  Clinical Evaluation Leading to the Procedure:   ACS: no  Non-ACS:    Anginal Classification: CCS III  Anti-ischemic medical therapy: Minimal Therapy (1 class of medications)  Non-Invasive Test Results: No non-invasive testing performed  Prior CABG: Previous CABG       Spenser Cong

## 2013-05-08 NOTE — Interval H&P Note (Signed)
History and Physical Interval Note:  05/08/2013 1:08 PM  [name]  has presented today for cardiac cath with the diagnosis of Chest Pain  The various methods of treatment have been discussed with the patient and family. After consideration of risks, benefits and other options for treatment, the patient has consented to  Procedure(s): LEFT HEART CATHETERIZATION WITH CORONARY ANGIOGRAM (N/A) as a surgical intervention .  The patient's history has been reviewed, patient examined, no change in status, stable for surgery.  I have reviewed the patient's chart and labs.  Questions were answered to the patient's satisfaction.    Cath Lab Visit (complete for each Cath Lab visit)  Clinical Evaluation Leading to the Procedure:   ACS: no  Non-ACS:    Anginal Classification: CCS III  Anti-ischemic medical therapy: Maximal Therapy (2 or more classes of medications)  Non-Invasive Test Results: No non-invasive testing performed  Prior CABG: No previous CABG       [name]

## 2013-05-08 NOTE — CV Procedure (Signed)
Cardiac Catheterization Operative Report  [name] [id number] 3/9/20152:12 PM Imelda Pillow, NP  Procedure Performed:  1. Left Heart Catheterization 2. Selective Coronary Angiography 3. Left ventricular angiogram 4. PTCA/DES x 1 mid LAD 5. Angioseal right femoral artery  Operator: Lauree Chandler, MD  Indication: 58 yo female with history of CAD s/p previous stenting of the mid RCA, recent chest pain c/w unstable angina.                                       Procedure Details: The risks, benefits, complications, treatment options, and expected outcomes were discussed with the patient. The patient and/or family concurred with the proposed plan, giving informed consent. The patient was brought to the cath lab after IV hydration was begun and oral premedication was given. The patient was further sedated with Versed and Fentanyl. The right groin was prepped and draped in the usual manner. Using the modified Seldinger access technique, a 5 French sheath was placed in the right femoral artery. Standard diagnostic catheters were used to perform selective coronary angiography. A pigtail catheter was used to perform a left ventricular angiogram. She was found to have severe stenosis in the mid LAD. I elected to perform PCI of the mid LAD.   PCI Note: The sheath was upsized to a 6 [place] system. The patient was given a bolus of Angiomax and a drip was started. I engaged the left main with a XB LAD 3.0 guiding catheter. When the ACT was over 200, I passed a [place]oug[place] IC wire down the LAD. A 2.0 x 12 mm balloon was used to pre-dilate the stenosis. A 2.5 x 16 mm Promus Premier DES was deployed in the mid LAD. A 2.5 x 12 mm [place] balloon was used to post-dilate the stent. The stenosis was taken from 99% down to 0%.   There were no immediate complications. An Angioseal femoral artery closure device was placed in the right femoral artery. The patient was taken to the recovery area in stable  condition.   Hemodynamic Findings: Central aortic pressure: 148/91 Left ventricular pressure: 125/3/10  Angiographic Findings:  Left main: No obstructive disease.   Left Anterior Descending Artery: Large caliber vessel that courses to the apex. The mid vessel has a focal 99% stenosis. The distal vessel has diffuse non-obstructive plaque. The moderate caliber diagonal branch has mild plaque disease.   Circumflex Artery: Large caliber vessel with two small caliber obtuse marginal branches followed by a third moderate caliber, bifurcating obtuse marginal branch. No obstructive disease.   Right Coronary Artery: Large, dominant vessel with 20% proximal stenosis. The mid vessel has a patent stented segment with no restenosis.  The PDA is small in caliber with ostial 90% stenosis (1.75 mm vessel) and too small for PCI.   Left Ventricular Angiogram: LVEF=40% with severe hypokinesis of the anterior wall to the apex.   Impression: 1. Double vessel CAD with patent stents mid RCA and severe stenosis mid LAD 2. Moderate segmental LV systolic dysfunction 3. Successful PTCA/DES x 1 mid LAD 4. Unstable angina  Recommendations: She will need dual antiplatelet therapy with ASA and Plavix for lifetime. She has not tolerated statins or beta blockers. Will continue Imdur, Norvasc and add Lisinopril. D/C home in am 05/09/13 if stable.        Complications:  None. The patient tolerated the procedure well.

## 2013-05-09 ENCOUNTER — Encounter (HOSPITAL_COMMUNITY): Payer: Self-pay | Admitting: Nurse Practitioner

## 2013-05-09 DIAGNOSIS — I2510 Atherosclerotic heart disease of native coronary artery without angina pectoris: Secondary | ICD-10-CM

## 2013-05-09 DIAGNOSIS — R072 Precordial pain: Secondary | ICD-10-CM

## 2013-05-09 DIAGNOSIS — I200 Unstable angina: Secondary | ICD-10-CM

## 2013-05-09 LAB — BASIC METABOLIC PANEL
BUN: 22 mg/dL (ref 6–23)
CALCIUM: 8.6 mg/dL (ref 8.4–10.5)
CO2: 26 mEq/L (ref 19–32)
Chloride: 106 mEq/L (ref 96–112)
Creatinine, Ser: 0.7 mg/dL (ref 0.50–1.10)
Glucose, Bld: 103 mg/dL — ABNORMAL HIGH (ref 70–99)
Potassium: 4.2 mEq/L (ref 3.7–5.3)
SODIUM: 143 meq/L (ref 137–147)

## 2013-05-09 LAB — CBC
HCT: 36.7 % (ref 36.0–46.0)
Hemoglobin: 12.4 g/dL (ref 12.0–15.0)
MCH: 30.1 pg (ref 26.0–34.0)
MCHC: 33.8 g/dL (ref 30.0–36.0)
MCV: 89.1 fL (ref 78.0–100.0)
PLATELETS: 204 10*3/uL (ref 150–400)
RBC: 4.12 MIL/uL (ref 3.87–5.11)
RDW: 13.4 % (ref 11.5–15.5)
WBC: 7.4 10*3/uL (ref 4.0–10.5)

## 2013-05-09 MED ORDER — LISINOPRIL 2.5 MG PO TABS: 2.5000 mg | ORAL_TABLET | Freq: Every day | ORAL | Status: DC

## 2013-05-09 MED FILL — Sodium Chloride IV Soln 0.9%: INTRAVENOUS | Qty: 50 | Status: AC

## 2013-05-09 NOTE — Progress Notes (Signed)
CARDIAC REHAB PHASE I   PRE:  Rate/Rhythm: 79 SR    BP: sitting 99/53    SaO2:   MODE:  Ambulation: 600 ft   POST:  Rate/Rhythm: 106 ST    BP: sitting 103/67     SaO2:   Pt SOB after walking up hill slowly (200 ft). Needed to sit and rest. Also began c/o a feeling in her chest "like CP was getting ready to start". Sts she has this sensation often in last 2 weeks. This sensation continued the rest of the walk and 10 min after resting.  Reviewed ed with pt encouraging more exercise as she has not done any since her stent in October. She sts she might do CRPII this time. Will send referral to [facility]. Pt declined diet ed as she has learned it many times before. [id number] [name] [place] CES, ACSM 05/09/2013 10:36 AM

## 2013-05-09 NOTE — Discharge Instructions (Signed)
**  PLEASE REMEMBER TO BRING ALL OF YOUR MEDICATIONS TO EACH OF YOUR FOLLOW-UP OFFICE VISITS.  NO HEAVY LIFTING OR SEXUAL ACTIVITY X 7 DAYS. NO DRIVING X 3-5 DAYS. NO SOAKING BATHS, HOT TUBS, POOLS, ETC., X 7 DAYS.  Groin Site Care Refer to this sheet in the next few weeks. These instructions provide you with information on caring for yourself after your procedure. Your caregiver may also give you more specific instructions. Your treatment has been planned according to current medical practices, but problems sometimes occur. Call your caregiver if you have any problems or questions after your procedure. HOME CARE INSTRUCTIONS  You may shower 24 hours after the procedure. Remove the bandage (dressing) and gently wash the site with plain soap and water. Gently pat the site dry.   Do not apply powder or lotion to the site.   Do not sit in a bathtub, swimming pool, or whirlpool for 5 to 7 days.   No bending, squatting, or lifting anything over 10 pounds (4.5 kg) as directed by your caregiver.   Inspect the site at least twice daily.   What to expect:  Any bruising will usually fade within 1 to 2 weeks.   Blood that collects in the tissue (hematoma) may be painful to the touch. It should usually decrease in size and tenderness within 1 to 2 weeks.  SEEK IMMEDIATE MEDICAL CARE IF:  You have unusual pain at the groin site or down the affected leg.   You have redness, warmth, swelling, or pain at the groin site.   You have drainage (other than a small amount of blood on the dressing).   You have chills.   You have a fever or persistent symptoms for more than 72 hours.   You have a fever and your symptoms suddenly get worse.   Your leg becomes pale, cool, tingly, or numb.  You have heavy bleeding from the site. Hold pressure on the site. Marland Kitchen

## 2013-05-09 NOTE — Discharge Summary (Signed)
The patient is in good spirits this AM. Had a single episode of chest [pain last PM requiring SL NTG. If ambulates without angina will discjharge later this AM. Right groin okay.

## 2013-05-09 NOTE — Discharge Summary (Signed)
Discharge Summary   Patient ID: [name],  MRN: [id number], DOB/AGE: 59-Mar-1957 59 y.o.  Admit date: 05/08/2013 Discharge date: 05/09/2013  Primary Care Provider: Imelda Pillow Primary Cardiologist: C. McAlhany, MD   Discharge Diagnoses Principal Problem:   Unstable angina  **s/p PCI and DES to the LAD this admission.  Active Problems:   CAD, NATIVE VESSEL   Ischemic Cardiomyopathy  **EF 40% by left ventriculography this admission.   HYPERLIPIDEMIA-MIXED   DEPRESSION   GERD   Irritable bowel syndrome   FIBROMYALGIA  Allergies Allergies  Allergen Reactions  . Ciprofloxacin Hives  . Codeine Hives and Itching  . Metoprolol     Does not tolerate beta blockers   . Statins     Liver enzymes   Procedures  Cardiac Catheterization and Percutaneous Coronary Intervention 3.9.2015  PCI Note: The sheath was upsized to a 6 [place] system. The patient was given a bolus of Angiomax and a drip was started. I engaged the left main with a XB LAD 3.0 guiding catheter. When the ACT was over 200, I passed a Cougar IC wire down the LAD. A 2.0 x 12 mm balloon was used to pre-dilate the stenosis. A  was deployed in the mid LAD. A 2.5 x 12 mm [place] balloon was used to post-dilate the stent. The stenosis was taken from 99% down to 0%.   There were no immediate complications. An Angioseal femoral artery closure device was placed in the right femoral artery. The patient was taken to the recovery area in stable condition.   Hemodynamic Findings: Central aortic pressure: 148/91 Left ventricular pressure: 125/3/10  Angiographic Findings:  Left main: No obstructive disease.  Left Anterior Descending Artery: Large caliber vessel that courses to the apex. The mid vessel has a focal 99% stenosis. The distal vessel has diffuse non-obstructive plaque. The moderate caliber diagonal branch has mild plaque disease.    **The mid LAD was successfully stented with a 2.5 x 16 mm Promus Premier  DES.**  Circumflex Artery: Large caliber vessel with two small caliber obtuse marginal branches followed by a third moderate caliber, bifurcating obtuse marginal branch. No obstructive disease.  Right Coronary Artery: Large, dominant vessel with 20% proximal stenosis. The mid vessel has a patent stented segment with no restenosis.  The PDA is small in caliber with ostial 90% stenosis (1.75 mm vessel) and too small for PCI.  Left Ventricular Angiogram: LVEF=40% with severe hypokinesis of the anterior wall to the apex.   Recommendations: She will need dual antiplatelet therapy with ASA and Plavix for lifetime. She has not tolerated statins or beta blockers. Will continue Imdur, Norvasc and add Lisinopril. D/C home in am 05/09/13 if stable.      _____________   History of Present Illness  58 year old female with prior history of coronary artery disease status post drug-eluting stent placement to the right coronary artery in March 2010. She also has a history of asthma, depression, GERD, fibromyalgia, and an intolerance to beta blockers and statins. She was seen in cardiology clinic recently with complaints of intermittent rest and exertional chest discomfort and decision was made to pursue outpatient cardiac catheterization.  Hospital Course  Patient presented to the [facility] cardiac catheterization laboratory on 05/08/2013 and underwent diagnostic cardiac catheterization revealing a patent stent in the right coronary artery with a new 99% focal stenosis in the mid LAD. The far PDA was a small vessel with a 90% ostial stenosis. She otherwise had nonobstructive disease with an EF of  40%. The LAD was felt to be the culprit for her symptoms and she subsequently underwent successful PCI and stenting of the mid LAD with placement of a 2.5 by 37m Promus Premier drug-eluting stent. She tolerated procedure well and post procedure did experience intermittent mild chest and arm discomfort. She did ablate this  morning with some recurrent mild discomfort and has been maintained on long-acting nitrate therapy. She has since been feeling better and feels stable for discharge this afternoon. Of note, we also add an ACE inhibitor therapy to her regimen in the setting of an EF of 40%. She is intolerant to beta blockers.  Discharge Vitals Blood pressure 94/55, pulse 82, temperature 98 F (36.7 C), temperature source Oral, resp. rate 18, height 5' 7"  (1.702 m), weight 159 lb 13.3 oz (72.5 kg), SpO2 95.00%.  Filed Weights   05/08/13 1015 05/09/13 0004  Weight: 157 lb (71.215 kg) 159 lb 13.3 oz (72.5 kg)   Labs  CBC  Recent Labs  05/09/13 0624  WBC 7.4  HGB 12.4  HCT 36.7  MCV 89.1  PLT 2478  Basic Metabolic Panel  Recent Labs  05/09/13 0624  NA 143  K 4.2  CL 106  CO2 26  GLUCOSE 103*  BUN 22  CREATININE 0.70  CALCIUM 8.6   Disposition  Pt is being discharged home today in good condition.  Follow-up Plans & Appointments      Follow-up Information   Follow up with MLauree Chandler MD On 06/27/2013. (3:15 PM)    Specialty:  Cardiology   Contact information:   [address] [place] [phone]       Follow up with CMurray Hodgkins NP On 05/23/2013. (11:00 AM)    Specialty:  Nurse Practitioner   Contact information:   [id number] [phone]      Follow up with MImelda Pillow NP. (as scheduled.)    Contact information:   [facility] [address] [phone]     Discharge Medications    Medication List         albuterol (5 MG/ML) 0.5% nebulizer solution  Commonly known as:  PROVENTIL  Take 2.5 mg by nebulization every 6 (six) hours as needed for wheezing.     albuterol 108 (90 BASE) MCG/ACT inhaler  Commonly known as:  PROVENTIL HFA;VENTOLIN HFA  Inhale 2 puffs into the lungs every 6 (six) hours as needed for wheezing.      amLODipine 2.5 MG tablet  Commonly known as:  NORVASC  Take 1 tablet (2.5 mg total) by mouth daily.     amoxicillin 500 MG capsule  Commonly known as:  AMOXIL  Take 1 capsule (500 mg total) by mouth 3 (three) times daily.     aspirin 81 MG chewable tablet  Chew 1 tablet (81 mg total) by mouth daily.     buPROPion 150 MG 12 hr tablet  Commonly known as:  WELLBUTRIN SR  Take 150 mg by mouth 2 (two) times daily.     clopidogrel 75 MG tablet  Commonly known as:  PLAVIX  Take 1 tablet (75 mg total) by mouth daily with breakfast.     ergocalciferol 50000 UNITS capsule  Commonly known as:  VITAMIN D2  Take 50,000 Units by mouth once a week. On Sunday     fenofibrate 145 MG tablet  Commonly known as:  TRICOR  Take  145 mg by mouth daily.     fluticasone 220 MCG/ACT inhaler  Commonly known as:  FLOVENT HFA  Inhale 1 puff into the lungs 2 (two) times daily.     HYDROcodone-acetaminophen 7.5-325 MG per tablet  Commonly known as:  NORCO  Take 1 tablet by mouth every 6 (six) hours as needed for pain.     isosorbide mononitrate 30 MG 24 hr tablet  Commonly known as:  IMDUR  Take 1 tablet (30 mg total) by mouth daily.     lisinopril 2.5 MG tablet  Commonly known as:  PRINIVIL,ZESTRIL  Take 1 tablet (2.5 mg total) by mouth daily.     LORazepam 2 MG tablet  Commonly known as:  ATIVAN  Take 2 mg by mouth every 6 (six) hours as needed for anxiety. At bedtime     montelukast 10 MG tablet  Commonly known as:  SINGULAIR  Take 10 mg by mouth at bedtime as needed (for allergies).     nitroGLYCERIN 0.4 MG SL tablet  Commonly known as:  NITROSTAT  Place 1 tablet (0.4 mg total) under the tongue every 5 (five) minutes as needed for chest pain.       Outstanding Labs/Studies  bmet @ f/u appt.  Duration of Discharge Encounter   Greater than 30 minutes including physician time.  Signed, Murray Hodgkins NP 05/09/2013, 11:52 AM

## 2013-05-11 NOTE — Discharge Summary (Signed)
Clinical data reviewed patient was examined. The patient is ambulated and had no difficulty. Discharge today with followup in 10 days. Agree with note as it appears in the records.

## 2013-05-12 ENCOUNTER — Telehealth: Payer: Self-pay | Admitting: Cardiovascular Disease

## 2013-05-12 NOTE — Telephone Encounter (Signed)
New message  Patient had stent put in on Monday. She was given Isorbide to take, she stopped taking it today. Because it caused bad headaches. She wants to know if there is something different she can take. Please call and advise.

## 2013-05-12 NOTE — Telephone Encounter (Signed)
Returned call to patient she stated she cannot take Isosorbide causes severe headache.Stated had to stop taking today.Patient wanted to know if Dr.McAlhany wanted to prescribe a different medication.Message sent to Stuart.

## 2013-05-15 NOTE — Telephone Encounter (Signed)
If she cannot tolerate Imdur then she can stop it. No other medication I would recommend at this time to replace it. Gerald Stabs

## 2013-05-15 NOTE — Telephone Encounter (Signed)
Spoke with pt and gave her information from Dr. Angelena Form. She is not having any chest pain. She reports increase in palpitations over the weekend. Holter monitor results pending. Pt reports she did not have as many palpitations while wearing monitor as she has had over the weekend.

## 2013-05-23 ENCOUNTER — Encounter: Payer: Managed Care, Other (non HMO) | Admitting: Nurse Practitioner

## 2013-05-29 NOTE — Telephone Encounter (Signed)
Spoke with pt and reviewed monitor results with her.  We also received note from [employer] indicating pt was taking amphetamine salt combination medication.  I spoke with pt and she states she was on medication in the past for ADD but has not taken for a long time.

## 2013-05-31 ENCOUNTER — Encounter: Payer: Managed Care, Other (non HMO) | Admitting: Physician Assistant

## 2013-06-27 ENCOUNTER — Encounter: Payer: Self-pay | Admitting: Cardiovascular Disease

## 2013-06-27 ENCOUNTER — Ambulatory Visit (INDEPENDENT_AMBULATORY_CARE_PROVIDER_SITE_OTHER): Payer: Managed Care, Other (non HMO) | Admitting: Cardiovascular Disease

## 2013-06-27 VITALS — BP 140/90 | HR 112 | Ht 67.0 in | Wt 171.0 lb

## 2013-06-27 DIAGNOSIS — I2510 Atherosclerotic heart disease of native coronary artery without angina pectoris: Secondary | ICD-10-CM

## 2013-06-27 DIAGNOSIS — E785 Hyperlipidemia, unspecified: Secondary | ICD-10-CM

## 2013-06-27 DIAGNOSIS — R002 Palpitations: Secondary | ICD-10-CM

## 2013-06-27 NOTE — Progress Notes (Signed)
History of Present Illness: 58 y.o. female with history of asthma, depression, GERD, fibromyalgia and CAD who is here today for cardiac follow up. Nuclear study 12/09: EF 59%, no ischemia. [place] 04/2008 demonstrated 80% stenosis in the mid RCA treated with a Xience DES. Patient was noted to have possible SVT during cardiac rehabilitation 05/2008. Re-look LHC 05/2008: Proximal RCA 25%, mid RCA stent patent. Holter 03/2009: NSR, PACs and PVCs. Liver biopsy 2011 reportedly normal (steatohepatitis). She has not tolerated statins in the past. Last seen 11/03/12 by Richardson Dopp, PA-C. She had been off of all cardiac meds. She noted significant fatigue, dyspnea and chest pain. Stress myoview arranged on  11/21/12 which showed a reversible basal inferior perfusion defect. She described class III angina on her f/u in our office 12/02/12. Cardiac cath 12/06/12 with severe stenosis in the mid RCA just before the old stent which also had restenosis. I treated the entire segment with a 2.75 x 28 mm Promus Premier DES. This was post-dilated to 3.0 mm. She had recurrent chest pain and was seen in the office 05/05/13. Cardiac cath 05/08/13 with severe stenosis mid LAD. A 2.5 x 16 mm Promus DES was deployed in the mid LAD. She also had c/o palpitations. 48 hour monitor March 2015 with PACs/PVCs.   She is here today for follow up. She is feeling much better. No chest pain. Some fatigue.   Primary Care Physician: Kathryne Hitch  Last Lipid Profile:   Past Medical History  Diagnosis Date  . Coronary artery disease     a. s/p Xience DES to [facility] 04/2008;  b. LHC 05/2008: Proximal RCA 25%, mid RCA stent patent;  c. Lexiscan Myoview (9/14):  Low risk, small mild partially reversible basal inferior perfusion defect, EF 61%, no rwma;  d. 04/2013 Cath/PCI: LM nl, LAD 28m2.25x16 Promus Premier DES), LCX nl, RCA 20p, patent mid stent, PDA 90ost (small), EF 40% sev HK of ant wall to apex.  . Hyperlipidemia     intol to statins and  other chol agents due to elevated LFTs  . GERD (gastroesophageal reflux disease)   . Fibromyalgia   . Asthma   . Pneumonia     "couple times; not recently" (12/06/2012)  . Chronic bronchitis     "used to get it yearly; not so much since pneu shots" (12/06/2012)  . Shortness of breath     "all the time last month or so" (12/06/2012)  . Anemia     "long time ago" (12/06/2012)  . H/O hiatal hernia   . Hepatitis     "not A, B, or C" (12/06/2012)  . Sinus headache     "alot" (12/06/2012)  . Migraines     "when I was younger" (12/06/2012)  . Arthritis     "right knee real bad; in my hands bad" (12/06/2012)  . Depression     "years ago" (12/06/2012)  . Anginal pain   . Hypertension     Past Surgical History  Procedure Laterality Date  . Cardiac catheterization  04/2008; 05/2008  . Coronary angioplasty with stent placement  04/2008 12/06/2012    "1 + 1" (12/06/2012)  . Cholecystectomy    . Vaginal hysterectomy    . Tubal ligation    . Coronary stent placement  05/08/2013    [facility]   Current Outpatient Prescriptions  Medication Sig Dispense Refill  . albuterol (PROVENTIL HFA;VENTOLIN HFA) 108 (90 BASE) MCG/ACT  inhaler Inhale 2 puffs into the lungs every 6 (six) hours as needed for wheezing.      Marland Kitchen albuterol (PROVENTIL) (5 MG/ML) 0.5% nebulizer solution Take 2.5 mg by nebulization every 6 (six) hours as needed for wheezing.      Marland Kitchen amLODipine (NORVASC) 2.5 MG tablet Take 1 tablet (2.5 mg total) by mouth daily.  30 tablet  11  . amoxicillin (AMOXIL) 500 MG capsule Take 1 capsule (500 mg total) by mouth 3 (three) times daily.  21 capsule  0  . aspirin 81 MG chewable tablet Chew 1 tablet (81 mg total) by mouth daily.      Marland Kitchen buPROPion (WELLBUTRIN SR) 150 MG 12 hr tablet Take 150 mg by mouth 2 (two) times daily.      . clopidogrel (PLAVIX) 75 MG tablet Take 1 tablet (75 mg total) by mouth daily with breakfast.  30 tablet  11  . ergocalciferol (VITAMIN D2) 50000 UNITS capsule  Take 50,000 Units by mouth once a week. On Sunday      . fenofibrate (TRICOR) 145 MG tablet Take 145 mg by mouth daily.      . fluticasone (FLOVENT HFA) 220 MCG/ACT inhaler Inhale 1 puff into the lungs 2 (two) times daily.      Marland Kitchen HYDROcodone-acetaminophen (NORCO) 7.5-325 MG per tablet Take 1 tablet by mouth every 6 (six) hours as needed for pain.      Marland Kitchen lisinopril (PRINIVIL,ZESTRIL) 2.5 MG tablet Take 1 tablet (2.5 mg total) by mouth daily.  30 tablet  6  . LORazepam (ATIVAN) 2 MG tablet Take 2 mg by mouth every 6 (six) hours as needed for anxiety. At bedtime      . montelukast (SINGULAIR) 10 MG tablet Take 10 mg by mouth at bedtime as needed (for allergies).       . nitroGLYCERIN (NITROSTAT) 0.4 MG SL tablet Place 1 tablet (0.4 mg total) under the tongue every 5 (five) minutes as needed for chest pain.  25 tablet  12   No current facility-administered medications for this visit.    Allergies  Allergen Reactions  . Ciprofloxacin Hives  . Codeine Hives and Itching  . Metoprolol     Does not tolerate beta blockers   . Statins     Liver enzymes    History   Social History  . Marital Status: Married    Spouse Name: N/A    Number of Children: N/A  . Years of Education: N/A   Occupational History  . Not on file.   Social History Main Topics  . Smoking status: Former Smoker -- 0.50 packs/day for 30 years    Types: Cigarettes  . Smokeless tobacco: Never Used     Comment: 12/06/2012 "quit smoking cigarettes ~ 8 yr ago"  . Alcohol Use: No  . Drug Use: No  . Sexual Activity: Yes   Other Topics Concern  . Not on file   Social History Narrative  . No narrative on file    Family History: No CAD  Review of Systems:  As stated in the HPI and otherwise negative.   BP 140/90  Pulse 112  Ht 5' 7"  (1.702 m)  Wt 171 lb (77.565 kg)  BMI 26.78 kg/m2  Physical Examination: General: Well developed, well nourished, NAD HEENT: OP clear, mucus membranes moist SKIN: warm, dry. No  rashes. Neuro: No focal deficits Musculoskeletal: Muscle strength 5/5 all ext Psychiatric: Mood and affect normal Neck: No JVD, no carotid bruits, no thyromegaly, no lymphadenopathy.  Lungs:Clear bilaterally, no wheezes, rhonci, crackles Cardiovascular: Regular rate and rhythm. No murmurs, gallops or rubs. Abdomen:Soft. Bowel sounds present. Non-tender.  Extremities: No lower extremity edema. Pulses are 2 + in the bilateral DP/PT.  Stress myoview 11/21/12: Stress Procedure: The patient received IV Lexiscan 0.4 mg over 15-seconds. Technetium 68mSestamibi injected at 30-seconds. This patient had sob and felt funny with the Lexiscan injection. Quantitative spect images were obtained after a 45 minute delay.  Stress ECG: No significant change from baseline ECG  QPS  Raw Data Images: Normal; no motion artifact; normal heart/lung ratio.  Stress Images: Small, mild basal inferior perfusion defect.  Rest Images: Small, mild basal inferior perfusion defect. Defect is somewhat less prominent than with stress.  Subtraction (SDS): Primarily fixed small mild basal inferior perfusion defect.  Transient Ischemic Dilatation (Normal <1.22): n/a  Lung/Heart Ratio (Normal <0.45): 0.37  Quantitative Gated Spect Images  QGS EDV: 74 ml  QGS ESV: 29 ml  Impression  Exercise Capacity: Lexiscan with no exercise.  BP Response: Normal blood pressure response.  Clinical Symptoms: Short of breath.  ECG Impression: No significant ST segment change suggestive of ischemia.  Comparison with Prior Nuclear Study: No images to compare  Overall Impression: Low risk stress nuclear study with a small, mild partially reversible basal inferior perfusion defect. Cannot rule out small area of ischemia but may be soft tissue attenuation. .  LV Ejection Fraction: 61%. LV Wall Motion: NL LV Function; NL Wall Motion  Cardiac cath 05/08/13: Left main: No obstructive disease.  Left Anterior Descending Artery: Large caliber vessel  that courses to the apex. The mid vessel has a focal 99% stenosis. The distal vessel has diffuse non-obstructive plaque. The moderate caliber diagonal branch has mild plaque disease.  Circumflex Artery: Large caliber vessel with two small caliber obtuse marginal branches followed by a third moderate caliber, bifurcating obtuse marginal branch. No obstructive disease.  Right Coronary Artery: Large, dominant vessel with 20% proximal stenosis. The mid vessel has a patent stented segment with no restenosis. The PDA is small in caliber with ostial 90% stenosis (1.75 mm vessel) and too small for PCI.  Left Ventricular Angiogram: LVEF=40% with severe hypokinesis of the anterior wall to the apex.   Assessment and Plan:   1. CAD: s/p recent PCI with DES mid LAD 05/08/13. Continue ASA and Plavix. She does not tolerate statins or beta blockers.   2. Palpitations: PACs/PVCs on cardiac monitor march 2015.   3. Hyperlipidemia: She does not tolerate statins. Lipids are followed in primary care. Now on T[employer]or. 8

## 2013-06-27 NOTE — Patient Instructions (Signed)
Your physician wants you to follow-up in:  6 months. You will receive a reminder letter in the mail two months in advance. If you don't receive a letter, please call our office to schedule the follow-up appointment.   

## 2013-07-17 ENCOUNTER — Telehealth (HOSPITAL_COMMUNITY): Payer: Self-pay | Admitting: Cardiac Rehabilitation

## 2013-07-17 NOTE — Telephone Encounter (Signed)
Multiple phone attempts to contact pt to enroll in cardiac rehab.  Letter mailed to pt also. No response from pt to enroll.  Dr. Angelena Form made aware.

## 2013-10-02 ENCOUNTER — Telehealth: Payer: Self-pay | Admitting: Cardiovascular Disease

## 2013-10-02 MED ORDER — METOPROLOL TARTRATE 25 MG PO TABS: 25.0000 mg | ORAL_TABLET | Freq: Two times a day (BID) | ORAL | Status: DC

## 2013-10-02 NOTE — Telephone Encounter (Signed)
We can start Lopressor 12.5 mg po BID and if she tolerates, she can increase to 25 mg po BID after a few days. Thanks, chris

## 2013-10-02 NOTE — Telephone Encounter (Signed)
Spoke with pt who reports elevated heart rate recently. She becomes short of breath with this. No fluttering or palpitations but aware of increased heart rate with any activity. Was watering flowers last night and heart rate was 155. She rested and it eventually came down to 105.  This AM heart rate is 105. Recent readings-- Yesterday--blood pressure-166/105                    152/98, heart rate 132                    149/98. Heart rate 107 Sat- 133/85 Friday-163/82 Wednesday-118/82,95. She has metoprolol listed as allergy but she states she has been able to tolerate lower doses.Higher does made her feel bad. Husband takes 25 mg and she took one of his a couple of times when her heart rate was elevated.  Feels tired and is limiting activity due to heart rate issues. No chest pain Will forward to Dr. Angelena Form for recommendations

## 2013-10-02 NOTE — Telephone Encounter (Signed)
New message     Pt states her heart is up---it was 155 last night.  Now it is between 105-110.  Please advise

## 2013-10-02 NOTE — Telephone Encounter (Signed)
Spoke with pt and gave her instructions from Dr. Angelena Form. Will send prescription to [facility] on [address]

## 2013-12-17 ENCOUNTER — Other Ambulatory Visit (HOSPITAL_COMMUNITY): Payer: Self-pay | Admitting: Physician Assistant

## 2013-12-27 ENCOUNTER — Encounter: Payer: Self-pay | Admitting: *Deleted

## 2013-12-27 ENCOUNTER — Encounter: Payer: Self-pay | Admitting: Cardiovascular Disease

## 2013-12-27 ENCOUNTER — Ambulatory Visit (INDEPENDENT_AMBULATORY_CARE_PROVIDER_SITE_OTHER): Payer: BC Managed Care – PPO | Admitting: Cardiovascular Disease

## 2013-12-27 VITALS — BP 128/84 | HR 95 | Ht 67.0 in | Wt 164.8 lb

## 2013-12-27 DIAGNOSIS — I25110 Atherosclerotic heart disease of native coronary artery with unstable angina pectoris: Secondary | ICD-10-CM

## 2013-12-27 DIAGNOSIS — R002 Palpitations: Secondary | ICD-10-CM

## 2013-12-27 DIAGNOSIS — I200 Unstable angina: Secondary | ICD-10-CM

## 2013-12-27 NOTE — Progress Notes (Addendum)
History of Present Illness: 58 y.o. female with history of asthma, depression, GERD, fibromyalgia and CAD who is here today for cardiac follow up. [place] 04/2008 demonstrated 80% stenosis in the mid RCA treated with a Xience DES. Patient was noted to have possible SVT during cardiac rehabilitation 05/2008. Re-look LHC 05/2008: Proximal RCA 25%, mid RCA stent patent. Holter 03/2009: NSR, PACs and PVCs. Liver biopsy 2011 reportedly normal (steatohepatitis). She has not tolerated statins in the past. Stress myoview on  11/21/12 showed a reversible basal inferior perfusion defect. She described class III angina on her f/u in our office 12/02/12. Cardiac cath 12/06/12 with severe stenosis in the mid RCA just before the old stent which also had restenosis. I treated the entire segment with a 2.75 x 28 mm Promus Premier DES. This was post-dilated to 3.0 mm. She had recurrent chest pain and was seen in the office 05/05/13. Cardiac cath 05/08/13 with severe stenosis mid LAD. A 2.5 x 16 mm Promus DES was deployed in the mid LAD. She also had c/o palpitations. 48 hour monitor March 2015 with PACs/PVCs. She called our office in August 2015 c/o tachycardia, heart racing. Lopressor 12.5 mg po BID started.   She is here today for follow up. She has many complaints. She describes feeling her heart race with minimal exertion. She also describes chest pain and SOB with minimal exertion, similar to prior angina. Some fatigue.   Primary Care Physician: Kathryne Hitch  Last Lipid Profile: Followed in primary care.   Past Medical History  Diagnosis Date  . Coronary artery disease     a. s/p Xience DES to [facility] 04/2008;  b. LHC 05/2008: Proximal RCA 25%, mid RCA stent patent;  c. Lexiscan Myoview (9/14):  Low risk, small mild partially reversible basal inferior perfusion defect, EF 61%, no rwma;  d. 04/2013 Cath/PCI: LM nl, LAD 17m2.25x16 Promus Premier DES), LCX nl, RCA 20p, patent mid stent, PDA 90ost (small), EF 40% sev HK of  ant wall to apex.  . Hyperlipidemia     intol to statins and other chol agents due to elevated LFTs  . GERD (gastroesophageal reflux disease)   . Fibromyalgia   . Asthma   . Pneumonia     "couple times; not recently" (12/06/2012)  . Chronic bronchitis     "used to get it yearly; not so much since pneu shots" (12/06/2012)  . Shortness of breath     "all the time last month or so" (12/06/2012)  . Anemia     "long time ago" (12/06/2012)  . H/O hiatal hernia   . Hepatitis     "not A, B, or C" (12/06/2012)  . Sinus headache     "alot" (12/06/2012)  . Migraines     "when I was younger" (12/06/2012)  . Arthritis     "right knee real bad; in my hands bad" (12/06/2012)  . Depression     "years ago" (12/06/2012)  . Anginal pain   . Hypertension     Past Surgical History  Procedure Laterality Date  . Cardiac catheterization  04/2008; 05/2008  . Coronary angioplasty with stent placement  04/2008 12/06/2012    "1 + 1" (12/06/2012)  . Cholecystectomy    . Vaginal hysterectomy    . Tubal ligation    . Coronary stent placement  05/08/2013    [facility] LAD      [facility]   Current Outpatient Prescriptions  Medication Sig Dispense Refill  .  albuterol (PROVENTIL HFA;VENTOLIN HFA) 108 (90 BASE) MCG/ACT inhaler Inhale 2 puffs into the lungs every 6 (six) hours as needed for wheezing.      Marland Kitchen albuterol (PROVENTIL) (5 MG/ML) 0.5% nebulizer solution Take 2.5 mg by nebulization every 6 (six) hours as needed for wheezing.      Marland Kitchen amLODipine (NORVASC) 2.5 MG tablet Take 1 tablet (2.5 mg total) by mouth daily.  30 tablet  11  . aspirin 81 MG chewable tablet Chew 1 tablet (81 mg total) by mouth daily.      Marland Kitchen buPROPion (WELLBUTRIN SR) 150 MG 12 hr tablet Take 150 mg by mouth 2 (two) times daily.      . clopidogrel (PLAVIX) 75 MG tablet TAKE 1 TABLET BY MOUTH DAILY WITH BREAKFAST  30 tablet  0  . ergocalciferol (VITAMIN D2) 50000 UNITS capsule Take 50,000 Units by mouth once a week. On Sunday      .  HYDROcodone-acetaminophen (NORCO) 7.5-325 MG per tablet Take 1 tablet by mouth every 6 (six) hours as needed for pain.      Marland Kitchen lisinopril (PRINIVIL,ZESTRIL) 2.5 MG tablet Take 1 tablet (2.5 mg total) by mouth daily.  30 tablet  6  . LORazepam (ATIVAN) 2 MG tablet Take 2 mg by mouth every 6 (six) hours as needed for anxiety. At bedtime      . metoprolol tartrate (LOPRESSOR) 25 MG tablet Take 1 tablet (25 mg total) by mouth 2 (two) times daily.  60 tablet  6  . montelukast (SINGULAIR) 10 MG tablet Take 10 mg by mouth at bedtime as needed (for allergies).       . nitroGLYCERIN (NITROSTAT) 0.4 MG SL tablet Place 1 tablet (0.4 mg total) under the tongue every 5 (five) minutes as needed for chest pain.  25 tablet  12   No current facility-administered medications for this visit.    Allergies  Allergen Reactions  . Ciprofloxacin Hives  . Codeine Hives and Itching  . Metoprolol     Does not tolerate beta blockers 10/02/2013-pt states she can tolerate 25 mg and lower   . Statins     Liver enzymes  . Tricor [Fenofibrate] Hives and Itching    History   Social History  . Marital Status: Married    Spouse Name: N/A    Number of Children: N/A  . Years of Education: N/A   Occupational History  . Not on file.   Social History Main Topics  . Smoking status: Former Smoker -- 0.50 packs/day for 30 years    Types: Cigarettes  . Smokeless tobacco: Never Used     Comment: 12/06/2012 "quit smoking cigarettes ~ 8 yr ago"  . Alcohol Use: No  . Drug Use: No  . Sexual Activity: Yes   Other Topics Concern  . Not on file   Social History Narrative  . No narrative on file    Family History: No CAD  Review of Systems:  As stated in the HPI and otherwise negative.   BP 128/84  Pulse 95  Ht 5' 7"  (1.702 m)  Wt 164 lb 12.8 oz (74.753 kg)  BMI 25.81 kg/m2  Physical Examination: General: Well developed, well nourished, NAD HEENT: OP clear, mucus membranes moist SKIN: warm, dry. No  rashes. Neuro: No focal deficits Musculoskeletal: Muscle strength 5/5 all ext Psychiatric: Mood and affect normal Neck: No JVD, no carotid bruits, no thyromegaly, no lymphadenopathy. Lungs:Clear bilaterally, no wheezes, rhonci, crackles Cardiovascular: Regular rate and rhythm. No murmurs, gallops or  rubs. Abdomen:Soft. Bowel sounds present. Non-tender.  Extremities: No lower extremity edema. Pulses are 2 + in the bilateral DP/PT.  Stress myoview 11/21/12: Stress Procedure: The patient received IV Lexiscan 0.4 mg over 15-seconds. Technetium 35mSestamibi injected at 30-seconds. This patient had sob and felt funny with the Lexiscan injection. Quantitative spect images were obtained after a 45 minute delay.  Stress ECG: No significant change from baseline ECG  QPS  Raw Data Images: Normal; no motion artifact; normal heart/lung ratio.  Stress Images: Small, mild basal inferior perfusion defect.  Rest Images: Small, mild basal inferior perfusion defect. Defect is somewhat less prominent than with stress.  Subtraction (SDS): Primarily fixed small mild basal inferior perfusion defect.  Transient Ischemic Dilatation (Normal <1.22): n/a  Lung/Heart Ratio (Normal <0.45): 0.37  Quantitative Gated Spect Images  QGS EDV: 74 ml  QGS ESV: 29 ml  Impression  Exercise Capacity: Lexiscan with no exercise.  BP Response: Normal blood pressure response.  Clinical Symptoms: Short of breath.  ECG Impression: No significant ST segment change suggestive of ischemia.  Comparison with Prior Nuclear Study: No images to compare  Overall Impression: Low risk stress nuclear study with a small, mild partially reversible basal inferior perfusion defect. Cannot rule out small area of ischemia but may be soft tissue attenuation. .  LV Ejection Fraction: 61%. LV Wall Motion: NL LV Function; NL Wall Motion  Cardiac cath 05/08/13: Left main: No obstructive disease.  Left Anterior Descending Artery: Large caliber vessel  that courses to the apex. The mid vessel has a focal 99% stenosis. The distal vessel has diffuse non-obstructive plaque. The moderate caliber diagonal branch has mild plaque disease.  Circumflex Artery: Large caliber vessel with two small caliber obtuse marginal branches followed by a third moderate caliber, bifurcating obtuse marginal branch. No obstructive disease.  Right Coronary Artery: Large, dominant vessel with 20% proximal stenosis. The mid vessel has a patent stented segment with no restenosis. The PDA is small in caliber with ostial 90% stenosis (1.75 mm vessel) and too small for PCI.  Left Ventricular Angiogram: LVEF=40% with severe hypokinesis of the anterior wall to the apex.    EKG: NSR, rate 95 bpm. No ischemia  Assessment and Plan:   1. CAD: s/p PCI with DES mid LAD 05/08/13. She is now having chest pain c/w unstable angina. EKG reviewed by me with no ischemic changes. Cath films reviewed by me from 05/08/13.  Will plan repeat cath on 01/04/14. Continue ASA and Plavix. She does not tolerate statins or beta blockers. Risks and benefits reviewed. Pre-cath labs today.   2. Palpitations: PACs/PVCs on cardiac monitor march 2015. She continues to have episodes of tachycardia. Will arrange another 48 hour monitor to exclude SVT or VT   3. Hyperlipidemia: She does not tolerate statins. Lipids are followed in primary care.

## 2013-12-27 NOTE — Patient Instructions (Addendum)
Your physician recommends that you schedule a follow-up appointment in: about 6 weeks with NP or PA.    Your physician has recommended that you wear a holter monitor. Holter monitors are medical devices that record the heart's electrical activity. Doctors most often use these monitors to diagnose arrhythmias. Arrhythmias are problems with the speed or rhythm of the heartbeat. The monitor is a small, portable device. You can wear one while you do your normal daily activities. This is usually used to diagnose what is causing palpitations/syncope (passing out).  Lab work to be done today--BMP, CBC, PT  Your physician has requested that you have a cardiac catheterization. Cardiac catheterization is used to diagnose and/or treat various heart conditions. Doctors may recommend this procedure for a number of different reasons. The most common reason is to evaluate chest pain. Chest pain can be a symptom of coronary artery disease (CAD), and cardiac catheterization can show whether plaque is narrowing or blocking your heart's arteries. This procedure is also used to evaluate the valves, as well as measure the blood flow and oxygen levels in different parts of your heart. For further information please visit [email or web address]. Please follow instruction sheet, as given. Scheduled for January 04, 2014

## 2013-12-28 LAB — BASIC METABOLIC PANEL
BUN: 17 mg/dL (ref 6–23)
CALCIUM: 9.2 mg/dL (ref 8.4–10.5)
CO2: 23 mEq/L (ref 19–32)
Chloride: 105 mEq/L (ref 96–112)
Creatinine, Ser: 0.9 mg/dL (ref 0.4–1.2)
GFR: 71.74 mL/min (ref >60.00)
Glucose, Bld: 91 mg/dL (ref 70–99)
Potassium: 4 mEq/L (ref 3.5–5.1)
Sodium: 138 mEq/L (ref 135–145)

## 2013-12-28 LAB — PROTIME-INR
INR: 1 ratio (ref 0.8–1.0)
Prothrombin Time: 10.7 s (ref 9.6–13.1)

## 2013-12-28 LAB — CBC
HCT: 42.6 % (ref 36.0–46.0)
Hemoglobin: 14 g/dL (ref 12.0–15.0)
MCHC: 32.8 g/dL (ref 30.0–36.0)
MCV: 88.4 fl (ref 78.0–100.0)
PLATELETS: 288 10*3/uL (ref 150.0–400.0)
RBC: 4.82 Mil/uL (ref 3.87–5.11)
RDW: 13.1 % (ref 11.5–15.5)
WBC: 11.5 10*3/uL — AB (ref 4.0–10.5)

## 2014-01-01 ENCOUNTER — Encounter (INDEPENDENT_AMBULATORY_CARE_PROVIDER_SITE_OTHER): Payer: BC Managed Care – PPO

## 2014-01-01 ENCOUNTER — Encounter: Payer: Self-pay | Admitting: *Deleted

## 2014-01-01 DIAGNOSIS — R002 Palpitations: Secondary | ICD-10-CM

## 2014-01-01 NOTE — Progress Notes (Signed)
Patient ID: [name], female   DOB: March 31, 1955, 58 y.o.   MRN: [id number] [employer] 48 hour holter monitor applied to patient.

## 2014-01-04 ENCOUNTER — Ambulatory Visit (HOSPITAL_COMMUNITY)
Admission: RE | Admit: 2014-01-04 | Discharge: 2014-01-04 | Disposition: A | Discharge status: Home / Self Care | Payer: BC Managed Care – PPO | Source: Ambulatory Visit | Attending: Cardiovascular Disease | Admitting: Cardiovascular Disease

## 2014-01-04 ENCOUNTER — Encounter (HOSPITAL_COMMUNITY)
Admission: RE | Disposition: A | Discharge status: Home / Self Care | Payer: Self-pay | Source: Ambulatory Visit | Attending: Cardiovascular Disease

## 2014-01-04 DIAGNOSIS — Z87891 Personal history of nicotine dependence: Secondary | ICD-10-CM | POA: Insufficient documentation

## 2014-01-04 DIAGNOSIS — Z79891 Long term (current) use of opiate analgesic: Secondary | ICD-10-CM | POA: Diagnosis not present

## 2014-01-04 DIAGNOSIS — I25110 Atherosclerotic heart disease of native coronary artery with unstable angina pectoris: Secondary | ICD-10-CM | POA: Diagnosis not present

## 2014-01-04 DIAGNOSIS — I200 Unstable angina: Secondary | ICD-10-CM

## 2014-01-04 DIAGNOSIS — Z7982 Long term (current) use of aspirin: Secondary | ICD-10-CM | POA: Insufficient documentation

## 2014-01-04 DIAGNOSIS — Z7902 Long term (current) use of antithrombotics/antiplatelets: Secondary | ICD-10-CM | POA: Insufficient documentation

## 2014-01-04 DIAGNOSIS — I10 Essential (primary) hypertension: Secondary | ICD-10-CM | POA: Insufficient documentation

## 2014-01-04 DIAGNOSIS — E785 Hyperlipidemia, unspecified: Secondary | ICD-10-CM | POA: Insufficient documentation

## 2014-01-04 DIAGNOSIS — Z955 Presence of coronary angioplasty implant and graft: Secondary | ICD-10-CM | POA: Insufficient documentation

## 2014-01-04 DIAGNOSIS — Z79899 Other long term (current) drug therapy: Secondary | ICD-10-CM | POA: Diagnosis not present

## 2014-01-04 HISTORY — PX: LEFT HEART CATHETERIZATION WITH CORONARY ANGIOGRAM: SHX5451

## 2014-01-04 SURGERY — LEFT HEART CATHETERIZATION WITH CORONARY ANGIOGRAM
Anesthesia: LOCAL

## 2014-01-04 MED ORDER — SODIUM CHLORIDE 0.9 % IJ SOLN: 3.0000 mL | Freq: Two times a day (BID) | INTRAMUSCULAR | Status: DC

## 2014-01-04 MED ORDER — NITROGLYCERIN 1 MG/10 ML FOR IR/CATH LAB
INTRA_ARTERIAL | Status: AC
  Filled 2014-01-04: qty 10

## 2014-01-04 MED ORDER — ASPIRIN 81 MG PO CHEW
CHEWABLE_TABLET | ORAL | Status: AC
  Administered 2014-01-04: 81 mg via ORAL
  Filled 2014-01-04: qty 1

## 2014-01-04 MED ORDER — SODIUM CHLORIDE 0.9 % IV SOLN: INTRAVENOUS | Status: DC

## 2014-01-04 MED ORDER — MIDAZOLAM HCL 2 MG/2ML IJ SOLN
INTRAMUSCULAR | Status: AC
  Filled 2014-01-04: qty 2

## 2014-01-04 MED ORDER — SODIUM CHLORIDE 0.9 % IJ SOLN: 3.0000 mL | INTRAMUSCULAR | Status: DC | PRN

## 2014-01-04 MED ORDER — FENTANYL CITRATE 0.05 MG/ML IJ SOLN
INTRAMUSCULAR | Status: AC
  Filled 2014-01-04: qty 2

## 2014-01-04 MED ORDER — SODIUM CHLORIDE 0.9 % IV SOLN
INTRAVENOUS | Status: DC
Start: 2014-01-05 — End: 2014-01-04
  Administered 2014-01-04: 09:00:00 via INTRAVENOUS

## 2014-01-04 MED ORDER — LIDOCAINE HCL (PF) 1 % IJ SOLN
INTRAMUSCULAR | Status: AC
  Filled 2014-01-04: qty 30

## 2014-01-04 MED ORDER — HEPARIN (PORCINE) IN NACL 2-0.9 UNIT/ML-% IJ SOLN
INTRAMUSCULAR | Status: AC
  Filled 2014-01-04: qty 1500

## 2014-01-04 MED ORDER — SODIUM CHLORIDE 0.9 % IV SOLN: 250.0000 mL | INTRAVENOUS | Status: DC | PRN

## 2014-01-04 MED ORDER — ASPIRIN 81 MG PO CHEW
81.0000 mg | CHEWABLE_TABLET | ORAL | Status: AC
  Administered 2014-01-04: 81 mg via ORAL

## 2014-01-04 NOTE — Interval H&P Note (Signed)
History and Physical Interval Note:  01/04/2014 12:42 PM  Fredrik Cove  has presented today for cardiac cath with the diagnosis of unstable angina/CAD. The various methods of treatment have been discussed with the patient and family. After consideration of risks, benefits and other options for treatment, the patient has consented to  Procedure(s): LEFT HEART CATHETERIZATION WITH CORONARY ANGIOGRAM (N/A) as a surgical intervention .  The patient's history has been reviewed, patient examined, no change in status, stable for surgery.  I have reviewed the patient's chart and labs.  Questions were answered to the patient's satisfaction.    Cath Lab Visit (complete for each Cath Lab visit)  Clinical Evaluation Leading to the Procedure:   ACS: No.  Non-ACS:    Anginal Classification: CCS III  Anti-ischemic medical therapy: Maximal Therapy (2 or more classes of medications)  Non-Invasive Test Results: No non-invasive testing performed  Prior CABG: No previous CABG         MCALHANY,CHRISTOPHER

## 2014-01-04 NOTE — Discharge Instructions (Signed)

## 2014-01-04 NOTE — H&P (View-Only) (Signed)
History of Present Illness: 58 y.o. female with history of asthma, depression, GERD, fibromyalgia and CAD who is here today for cardiac follow up. [place] 04/2008 demonstrated 80% stenosis in the mid RCA treated with a Xience DES. Patient was noted to have possible SVT during cardiac rehabilitation 05/2008. Re-look LHC 05/2008: Proximal RCA 25%, mid RCA stent patent. Holter 03/2009: NSR, PACs and PVCs. Liver biopsy 2011 reportedly normal (steatohepatitis). She has not tolerated statins in the past. Stress myoview on  11/21/12 showed a reversible basal inferior perfusion defect. She described class III angina on her f/u in our office 12/02/12. Cardiac cath 12/06/12 with severe stenosis in the mid RCA just before the old stent which also had restenosis. I treated the entire segment with a 2.75 x 28 mm Promus Premier DES. This was post-dilated to 3.0 mm. She had recurrent chest pain and was seen in the office 05/05/13. Cardiac cath 05/08/13 with severe stenosis mid LAD. A 2.5 x 16 mm Promus DES was deployed in the mid LAD. She also had c/o palpitations. 48 hour monitor March 2015 with PACs/PVCs. She called our office in August 2015 c/o tachycardia, heart racing. Lopressor 12.5 mg po BID started.   She is here today for follow up. She has many complaints. She describes feeling her heart race with minimal exertion. She also describes chest pain and SOB with minimal exertion, similar to prior angina. Some fatigue.   Primary Care Physician: Kathryne Hitch  Last Lipid Profile: Followed in primary care.   Past Medical History  Diagnosis Date  . Coronary artery disease     a. s/p Xience DES to [facility] 04/2008;  b. LHC 05/2008: Proximal RCA 25%, mid RCA stent patent;  c. Lexiscan Myoview (9/14):  Low risk, small mild partially reversible basal inferior perfusion defect, EF 61%, no rwma;  d. 04/2013 Cath/PCI: LM nl, LAD 80m2.25x16 Promus Premier DES), LCX nl, RCA 20p, patent mid stent, PDA 90ost (small), EF 40% sev HK of  ant wall to apex.  . Hyperlipidemia     intol to statins and other chol agents due to elevated LFTs  . GERD (gastroesophageal reflux disease)   . Fibromyalgia   . Asthma   . Pneumonia     "couple times; not recently" (12/06/2012)  . Chronic bronchitis     "used to get it yearly; not so much since pneu shots" (12/06/2012)  . Shortness of breath     "all the time last month or so" (12/06/2012)  . Anemia     "long time ago" (12/06/2012)  . H/O hiatal hernia   . Hepatitis     "not A, B, or C" (12/06/2012)  . Sinus headache     "alot" (12/06/2012)  . Migraines     "when I was younger" (12/06/2012)  . Arthritis     "right knee real bad; in my hands bad" (12/06/2012)  . Depression     "years ago" (12/06/2012)  . Anginal pain   . Hypertension     Past Surgical History  Procedure Laterality Date  . Cardiac catheterization  04/2008; 05/2008  . Coronary angioplasty with stent placement  04/2008 12/06/2012    "1 + 1" (12/06/2012)  . Cholecystectomy    . Vaginal hysterectomy    . Tubal ligation    . Coronary stent placement  05/08/2013    [facility] LAD      [facility]   Current Outpatient Prescriptions  Medication Sig Dispense Refill  .  albuterol (PROVENTIL HFA;VENTOLIN HFA) 108 (90 BASE) MCG/ACT inhaler Inhale 2 puffs into the lungs every 6 (six) hours as needed for wheezing.      Marland Kitchen albuterol (PROVENTIL) (5 MG/ML) 0.5% nebulizer solution Take 2.5 mg by nebulization every 6 (six) hours as needed for wheezing.      Marland Kitchen amLODipine (NORVASC) 2.5 MG tablet Take 1 tablet (2.5 mg total) by mouth daily.  30 tablet  11  . aspirin 81 MG chewable tablet Chew 1 tablet (81 mg total) by mouth daily.      Marland Kitchen buPROPion (WELLBUTRIN SR) 150 MG 12 hr tablet Take 150 mg by mouth 2 (two) times daily.      . clopidogrel (PLAVIX) 75 MG tablet TAKE 1 TABLET BY MOUTH DAILY WITH BREAKFAST  30 tablet  0  . ergocalciferol (VITAMIN D2) 50000 UNITS capsule Take 50,000 Units by mouth once a week. On Sunday      .  HYDROcodone-acetaminophen (NORCO) 7.5-325 MG per tablet Take 1 tablet by mouth every 6 (six) hours as needed for pain.      Marland Kitchen lisinopril (PRINIVIL,ZESTRIL) 2.5 MG tablet Take 1 tablet (2.5 mg total) by mouth daily.  30 tablet  6  . LORazepam (ATIVAN) 2 MG tablet Take 2 mg by mouth every 6 (six) hours as needed for anxiety. At bedtime      . metoprolol tartrate (LOPRESSOR) 25 MG tablet Take 1 tablet (25 mg total) by mouth 2 (two) times daily.  60 tablet  6  . montelukast (SINGULAIR) 10 MG tablet Take 10 mg by mouth at bedtime as needed (for allergies).       . nitroGLYCERIN (NITROSTAT) 0.4 MG SL tablet Place 1 tablet (0.4 mg total) under the tongue every 5 (five) minutes as needed for chest pain.  25 tablet  12   No current facility-administered medications for this visit.    Allergies  Allergen Reactions  . Ciprofloxacin Hives  . Codeine Hives and Itching  . Metoprolol     Does not tolerate beta blockers 10/02/2013-pt states she can tolerate 25 mg and lower   . Statins     Liver enzymes  . Tricor [Fenofibrate] Hives and Itching    History   Social History  . Marital Status: Married    Spouse Name: N/A    Number of Children: N/A  . Years of Education: N/A   Occupational History  . Not on file.   Social History Main Topics  . Smoking status: Former Smoker -- 0.50 packs/day for 30 years    Types: Cigarettes  . Smokeless tobacco: Never Used     Comment: 12/06/2012 "quit smoking cigarettes ~ 8 yr ago"  . Alcohol Use: No  . Drug Use: No  . Sexual Activity: Yes   Other Topics Concern  . Not on file   Social History Narrative  . No narrative on file    Family History: No CAD  Review of Systems:  As stated in the HPI and otherwise negative.   BP 128/84  Pulse 95  Ht 5' 7"  (1.702 m)  Wt 164 lb 12.8 oz (74.753 kg)  BMI 25.81 kg/m2  Physical Examination: General: Well developed, well nourished, NAD HEENT: OP clear, mucus membranes moist SKIN: warm, dry. No  rashes. Neuro: No focal deficits Musculoskeletal: Muscle strength 5/5 all ext Psychiatric: Mood and affect normal Neck: No JVD, no carotid bruits, no thyromegaly, no lymphadenopathy. Lungs:Clear bilaterally, no wheezes, rhonci, crackles Cardiovascular: Regular rate and rhythm. No murmurs, gallops or  rubs. Abdomen:Soft. Bowel sounds present. Non-tender.  Extremities: No lower extremity edema. Pulses are 2 + in the bilateral DP/PT.  Stress myoview 11/21/12: Stress Procedure: The patient received IV Lexiscan 0.4 mg over 15-seconds. Technetium 48mSestamibi injected at 30-seconds. This patient had sob and felt funny with the Lexiscan injection. Quantitative spect images were obtained after a 45 minute delay.  Stress ECG: No significant change from baseline ECG  QPS  Raw Data Images: Normal; no motion artifact; normal heart/lung ratio.  Stress Images: Small, mild basal inferior perfusion defect.  Rest Images: Small, mild basal inferior perfusion defect. Defect is somewhat less prominent than with stress.  Subtraction (SDS): Primarily fixed small mild basal inferior perfusion defect.  Transient Ischemic Dilatation (Normal <1.22): n/a  Lung/Heart Ratio (Normal <0.45): 0.37  Quantitative Gated Spect Images  QGS EDV: 74 ml  QGS ESV: 29 ml  Impression  Exercise Capacity: Lexiscan with no exercise.  BP Response: Normal blood pressure response.  Clinical Symptoms: Short of breath.  ECG Impression: No significant ST segment change suggestive of ischemia.  Comparison with Prior Nuclear Study: No images to compare  Overall Impression: Low risk stress nuclear study with a small, mild partially reversible basal inferior perfusion defect. Cannot rule out small area of ischemia but may be soft tissue attenuation. .  LV Ejection Fraction: 61%. LV Wall Motion: NL LV Function; NL Wall Motion  Cardiac cath 05/08/13: Left main: No obstructive disease.  Left Anterior Descending Artery: Large caliber vessel  that courses to the apex. The mid vessel has a focal 99% stenosis. The distal vessel has diffuse non-obstructive plaque. The moderate caliber diagonal branch has mild plaque disease.  Circumflex Artery: Large caliber vessel with two small caliber obtuse marginal branches followed by a third moderate caliber, bifurcating obtuse marginal branch. No obstructive disease.  Right Coronary Artery: Large, dominant vessel with 20% proximal stenosis. The mid vessel has a patent stented segment with no restenosis. The PDA is small in caliber with ostial 90% stenosis (1.75 mm vessel) and too small for PCI.  Left Ventricular Angiogram: LVEF=40% with severe hypokinesis of the anterior wall to the apex.    EKG: NSR, rate 95 bpm. No ischemia  Assessment and Plan:   1. CAD: s/p PCI with DES mid LAD 05/08/13. She is now having chest pain c/w unstable angina. EKG reviewed by me with no ischemic changes. Cath films reviewed by me from 05/08/13.  Will plan repeat cath on 01/04/14. Continue ASA and Plavix. She does not tolerate statins or beta blockers. Risks and benefits reviewed. Pre-cath labs today.   2. Palpitations: PACs/PVCs on cardiac monitor march 2015. She continues to have episodes of tachycardia. Will arrange another 48 hour monitor to exclude SVT or VT   3. Hyperlipidemia: She does not tolerate statins. Lipids are followed in primary care.

## 2014-01-04 NOTE — CV Procedure (Signed)
      Cardiac Catheterization Operative Report  [name] [id number] 11/5/20151:20 PM Imelda Pillow, NP  Procedure Performed:  1. Left Heart Catheterization 2. Selective Coronary Angiography 3. Left ventricular angiogram  Operator: Lauree Chandler, MD  Arterial access site:  Right femoral artery  Indication:  58 yo female with history of CAD with prior stenting of RCA and LAD with recent dyspnea/chest pain c/w unstable angina.                                     Procedure Details: The risks, benefits, complications, treatment options, and expected outcomes were discussed with the patient. The patient and/or family concurred with the proposed plan, giving informed consent. The patient was brought to the cath lab after IV hydration was begun and oral premedication was given. The patient was further sedated with Versed and Fentanyl. The right groin was prepped and draped in a sterile fashion. 1% lidocaine was used for local anesthesia. Using the modified Seldinger access technique, a 5 French sheath was placed in the right femoral artery. Standard diagnostic catheters were used to perform selective coronary angiography. A pigtail catheter was used to perform a left ventricular angiogram.     There were no immediate complications. The patient was taken to the recovery area in stable condition.   Hemodynamic Findings: Central aortic pressure: 130/76 Left ventricular pressure: 130/8/16  Angiographic Findings:  Left main: No obstructive disease.   Left Anterior Descending Artery: Large caliber vessel that courses to the apex. The mid vessel has a patent stented segment with no restenosis. The distal vessel has diffuse non-obstructive plaque. The moderate caliber diagonal branch has mild plaque disease.   Circumflex Artery: Large caliber vessel with two small caliber obtuse marginal branches followed by a third moderate caliber, bifurcating obtuse marginal branch. No  obstructive disease.   Right Coronary Artery: Large, dominant vessel with 20% proximal stenosis. The mid vessel has a patent stented segment with no restenosis. The PDA is small in caliber with ostial 60% stenosis (1.75 mm vessel) and too small for PCI.   Left Ventricular Angiogram: LVEF=55-60%  Impression: 1. Stable double vessel CAD 2. Normal LV function  Recommendations: Continue medical management of CAD.        Complications:  None. The patient tolerated the procedure well.

## 2014-01-10 ENCOUNTER — Telehealth: Payer: Self-pay | Admitting: *Deleted

## 2014-01-10 MED ORDER — METOPROLOL TARTRATE 25 MG PO TABS: 12.5000 mg | ORAL_TABLET | Freq: Two times a day (BID) | ORAL | Status: DC

## 2014-01-10 NOTE — Telephone Encounter (Signed)
Spoke with pt and reviewed monitor results and recommendations from Dr. Angelena Form with her. She reports she takes metoprolol 12. 5 mg by mouth twice daily and cannot tolerate higher dose. Will update med list

## 2014-02-05 ENCOUNTER — Ambulatory Visit: Payer: BC Managed Care – PPO | Admitting: Physician Assistant

## 2014-02-08 ENCOUNTER — Encounter (HOSPITAL_COMMUNITY): Payer: Self-pay | Admitting: Cardiovascular Disease

## 2014-02-12 ENCOUNTER — Encounter: Payer: Self-pay | Admitting: Physician Assistant

## 2014-02-12 ENCOUNTER — Ambulatory Visit (INDEPENDENT_AMBULATORY_CARE_PROVIDER_SITE_OTHER): Payer: BC Managed Care – PPO | Admitting: Physician Assistant

## 2014-02-12 VITALS — BP 124/82 | HR 93 | Ht 67.0 in | Wt 162.0 lb

## 2014-02-12 DIAGNOSIS — I2510 Atherosclerotic heart disease of native coronary artery without angina pectoris: Secondary | ICD-10-CM

## 2014-02-12 DIAGNOSIS — R000 Tachycardia, unspecified: Secondary | ICD-10-CM

## 2014-02-12 MED ORDER — DILTIAZEM HCL ER COATED BEADS 120 MG PO CP24: 120.0000 mg | ORAL_CAPSULE | Freq: Every day | ORAL | Status: DC

## 2014-02-12 NOTE — Patient Instructions (Signed)
Your physician has recommended you make the following change in your medication:    START TAKING DILTIAZEM  120 MG    Your physician recommends that you schedule a follow-up appointment in:  [place] 2 TO 3 MONTHS

## 2014-02-12 NOTE — Progress Notes (Signed)
HPI: This is a 58 year old female patient of Dr. Angelena Form with history of CAD status post prior stenting of the RCA and LAD who underwent a repeat cardiac catheterization for dyspnea and chest pain. The stent to the LAD was patent with distal vessel having diffuse nonobstructive plaque. No obstructive disease in the circumflex, 20% proximal RCA, small PDA with ostial 60% stenosis to small for PCI. EF 55-60% with normal LV function. Medical therapy was recommended. Patient also wore a Holter monitor that showed normal sinus rhythm with sinus tachycardia and 4 beats of SVT. Patient takes metoprolol and can't tolerate a higher dose.  Patient comes in today stating that her heart rate jumped up to 148 bpm while walking up a hill to her mailbox last Friday. She sits down and comes down quickly. She just can't tolerate the higher dose metoprolol. She is asking if there is something she could take. She's had no further chest pain. She isn't using her albuterol some for asthma exacerbations. She does not drink a lot of caffeine.  Allergies  Allergen Reactions  . Ciprofloxacin Hives  . Codeine Hives and Itching  . Metoprolol     Does not tolerate beta blockers 10/02/2013-pt states she can tolerate 25 mg and lower   . Statins     Liver enzymes  . Tricor [Fenofibrate] Hives and Itching     Current Outpatient Prescriptions  Medication Sig Dispense Refill  . albuterol (PROVENTIL HFA;VENTOLIN HFA) 108 (90 BASE) MCG/ACT inhaler Inhale 2 puffs into the lungs every 6 (six) hours as needed for wheezing.    Marland Kitchen albuterol (PROVENTIL) (5 MG/ML) 0.5% nebulizer solution Take 2.5 mg by nebulization every 6 (six) hours as needed for wheezing.    Marland Kitchen amLODipine (NORVASC) 2.5 MG tablet Take 1 tablet (2.5 mg total) by mouth daily. 30 tablet 11  . aspirin 81 MG chewable tablet Chew 1 tablet (81 mg total) by mouth daily.    Marland Kitchen buPROPion (WELLBUTRIN SR) 150 MG 12 hr tablet Take 150 mg by mouth 2 (two) times daily.     . clopidogrel (PLAVIX) 75 MG tablet Take 75 mg by mouth daily.    . ergocalciferol (VITAMIN D2) 50000 UNITS capsule Take 50,000 Units by mouth once a week. wednesday    . HYDROcodone-acetaminophen (NORCO) 7.5-325 MG per tablet Take 1 tablet by mouth every 6 (six) hours as needed for pain.    Marland Kitchen lisinopril (PRINIVIL,ZESTRIL) 2.5 MG tablet Take 1 tablet (2.5 mg total) by mouth daily. 30 tablet 6  . LORazepam (ATIVAN) 2 MG tablet Take 2 mg by mouth at bedtime.     . metoprolol tartrate (LOPRESSOR) 25 MG tablet Take 0.5 tablets (12.5 mg total) by mouth 2 (two) times daily. 30 tablet 6  . montelukast (SINGULAIR) 10 MG tablet Take 10 mg by mouth at bedtime as needed (for allergies).     . nitroGLYCERIN (NITROSTAT) 0.4 MG SL tablet Place 1 tablet (0.4 mg total) under the tongue every 5 (five) minutes as needed for chest pain. 25 tablet 12  . diltiazem (CARDIZEM CD) 120 MG 24 hr capsule Take 1 capsule (120 mg total) by mouth daily. 60 capsule 5   No current facility-administered medications for this visit.    Past Medical History  Diagnosis Date  . Coronary artery disease     a. s/p Xience DES to [facility] 04/2008;  b. LHC 05/2008: Proximal RCA 25%, mid RCA stent patent;  c. Lexiscan Myoview (9/14):  Low risk, small  mild partially reversible basal inferior perfusion defect, EF 61%, no rwma;  d. 04/2013 Cath/PCI: LM nl, LAD 76m2.25x16 Promus Premier DES), LCX nl, RCA 20p, patent mid stent, PDA 90ost (small), EF 40% sev HK of ant wall to apex.  . Hyperlipidemia     intol to statins and other chol agents due to elevated LFTs  . GERD (gastroesophageal reflux disease)   . Fibromyalgia   . Asthma   . Pneumonia     "couple times; not recently" (12/06/2012)  . Chronic bronchitis     "used to get it yearly; not so much since pneu shots" (12/06/2012)  . Shortness of breath     "all the time last month or so" (12/06/2012)  . Anemia     "long time ago" (12/06/2012)  . H/O hiatal hernia   . Hepatitis     "not A,  B, or C" (12/06/2012)  . Sinus headache     "alot" (12/06/2012)  . Migraines     "when I was younger" (12/06/2012)  . Arthritis     "right knee real bad; in my hands bad" (12/06/2012)  . Depression     "years ago" (12/06/2012)  . Anginal pain   . Hypertension     Past Surgical History  Procedure Laterality Date  . Cardiac catheterization  04/2008; 05/2008  . Coronary angioplasty with stent placement  04/2008 12/06/2012    "1 + 1" (12/06/2012)  . Cholecystectomy    . Vaginal hysterectomy    . Tubal ligation    . Coronary stent placement  05/08/2013    [facility]      [facility] Left heart catheterization with coronary angiogram N/A 12/06/2012    Procedure: LEFT HEART CATHETERIZATION WITH CORONARY ANGIOGRAM;  Surgeon: CBurnell Blanks MD;  [facility];  Service: Cardiovascular;  Laterality: N/A;  . Percutaneous coronary stent intervention (pci-s)  12/06/2012    Procedure: PERCUTANEOUS CORONARY STENT INTERVENTION (PCI-S);  Surgeon: CBurnell Blanks MD;  [facility];  Service: Cardiovascular;;  . Left heart catheterization with coronary angiogram N/A 05/08/2013    Procedure: LEFT HEART CATHETERIZATION WITH CORONARY ANGIOGRAM;  Surgeon: CBurnell Blanks MD;  [facility];  Service: Cardiovascular;  Laterality: N/A;  . Percutaneous coronary stent intervention (pci-s)  05/08/2013    Procedure: PERCUTANEOUS CORONARY STENT INTERVENTION (PCI-S);  Surgeon: CBurnell Blanks MD;  [facility];  Service: Cardiovascular;;  mid LAD   . Left heart catheterization with coronary angiogram N/A 01/04/2014    Procedure: LEFT HEART CATHETERIZATION WITH CORONARY ANGIOGRAM;  Surgeon: CBurnell Blanks MD;  [facility];  Service: Cardiovascular;  Laterality: N/A;    Family History  Problem Relation Age of Onset  . CAD Neg Hx     History   Social History  . Marital Status: Married    Spouse Name: N/A    Number of Children: N/A  .  Years of Education: N/A   Occupational History  . Not on file.   Social History Main Topics  . Smoking status: Former Smoker -- 0.50 packs/day for 30 years    Types: Cigarettes  . Smokeless tobacco: Never Used     Comment: 12/06/2012 "quit smoking cigarettes ~ 8 yr ago"  . Alcohol Use: No  . Drug Use: No  . Sexual Activity: Yes   Other Topics Concern  . Not on file   Social History Narrative    ROS: See history of present  illness otherwise negative  BP 124/82 mmHg  Pulse 93  Ht 5' 7"  (1.702 m)  Wt 162 lb (73.483 kg)  BMI 25.37 kg/m2  PHYSICAL EXAM: Well-nournished, in no acute distress. Neck: No JVD, HJR, Bruit, or thyroid enlargement  Lungs: No tachypnea, clear without wheezing, rales, or rhonchi  Cardiovascular: RRR, PMI not displaced, Normal S1 and S2, no murmurs, gallops, bruit, thrill, or heave.  Abdomen: BS normal. Soft without organomegaly, masses, lesions or tenderness.  Extremities: Right groin without hematoma or hemorrhage, otherwise lower extremities without cyanosis, clubbing or edema. Good distal pulses bilateral  SKin: Warm, no lesions or rashes   Musculoskeletal: No deformities  Neuro: no focal signs   Wt Readings from Last 3 Encounters:  02/12/14 162 lb (73.483 kg)  01/04/14 149 lb (67.586 kg)  12/27/13 164 lb 12.8 oz (74.753 kg)    Lab Results  Component Value Date   WBC 11.5* 12/27/2013   HGB 14.0 12/27/2013   HCT 42.6 12/27/2013   PLT 288.0 12/27/2013   GLUCOSE 91 12/27/2013   CHOL * 02/04/2008    267        ATP III CLASSIFICATION:  <200     mg/dL   Desirable  200-239  mg/dL   Borderline High  >=240    mg/dL   High   TRIG 685* 02/04/2008   HDL 26* 02/04/2008   LDLCALC  02/04/2008    UNABLE TO CALCULATE IF TRIGLYCERIDE OVER 400 mg/dL        Total Cholesterol/HDL:CHD Risk Coronary Heart Disease Risk Table                     Men   Women  1/2 Average Risk   3.4   3.3   ALT 19 11/02/2012   AST 19 11/02/2012   NA 138 12/27/2013     K 4.0 12/27/2013   CL 105 12/27/2013   CREATININE 0.9 12/27/2013   BUN 17 12/27/2013   CO2 23 12/27/2013   TSH 0.87 11/02/2012   INR 1.0 12/27/2013    EKG: Normal sinus rhythm at 93 bpm  Angiographic Findings: 01/04/14  Left main: No obstructive disease.   Left Anterior Descending Artery: Large caliber vessel that courses to the apex. The mid vessel has a patent stented segment with no restenosis. The distal vessel has diffuse non-obstructive plaque. The moderate caliber diagonal branch has mild plaque disease.   Circumflex Artery: Large caliber vessel with two small caliber obtuse marginal branches followed by a third moderate caliber, bifurcating obtuse marginal branch. No obstructive disease.   Right Coronary Artery: Large, dominant vessel with 20% proximal stenosis. The mid vessel has a patent stented segment with no restenosis.  The PDA is small in caliber with ostial 60% stenosis (1.75 mm vessel) and too small for PCI.   Left Ventricular Angiogram: LVEF=55-60%  Impression: 1. Stable double vessel CAD 2. Normal LV function   Recommendations: Continue medical management of CAD.         Complications:  None. The patient tolerated the procedure well

## 2014-02-12 NOTE — Assessment & Plan Note (Signed)
Stable on recent cardiac catheterization. Not having chest pain. Continue medical therapy. Follow-up with Dr.McAlhany in 2 months.

## 2014-02-12 NOTE — Assessment & Plan Note (Signed)
Patient continues to have tachycardia and fast heart rates with exertion. We'll add low-dose diltiazem 120 mg daily to the metoprolol. Hopefully she will tolerate this.

## 2014-04-02 ENCOUNTER — Encounter: Payer: BLUE CROSS/BLUE SHIELD | Admitting: Cardiovascular Disease

## 2014-04-02 NOTE — Progress Notes (Signed)
No show

## 2014-04-11 ENCOUNTER — Encounter: Payer: Self-pay | Admitting: Cardiovascular Disease

## 2014-11-14 ENCOUNTER — Encounter: Payer: BLUE CROSS/BLUE SHIELD | Admitting: Cardiovascular Disease

## 2014-11-14 NOTE — Progress Notes (Signed)
No show

## 2014-12-13 ENCOUNTER — Encounter: Payer: Self-pay | Admitting: Cardiovascular Disease

## 2015-02-11 ENCOUNTER — Ambulatory Visit (INDEPENDENT_AMBULATORY_CARE_PROVIDER_SITE_OTHER): Payer: Managed Care, Other (non HMO) | Admitting: Cardiovascular Disease

## 2015-02-11 ENCOUNTER — Encounter: Payer: Self-pay | Admitting: Cardiovascular Disease

## 2015-02-11 VITALS — BP 102/70 | HR 93 | Ht 67.0 in | Wt 159.0 lb

## 2015-02-11 DIAGNOSIS — I491 Atrial premature depolarization: Secondary | ICD-10-CM

## 2015-02-11 DIAGNOSIS — I2510 Atherosclerotic heart disease of native coronary artery without angina pectoris: Secondary | ICD-10-CM

## 2015-02-11 MED ORDER — METOPROLOL TARTRATE 25 MG PO TABS: 12.5000 mg | ORAL_TABLET | Freq: Two times a day (BID) | ORAL | Status: DC

## 2015-02-11 MED ORDER — CLOPIDOGREL BISULFATE 75 MG PO TABS: 75.0000 mg | ORAL_TABLET | Freq: Every day | ORAL | Status: DC

## 2015-02-11 NOTE — Patient Instructions (Signed)

## 2015-02-11 NOTE — Progress Notes (Signed)
Chief Complaint  Patient presents with  . Follow-up    CAD/PT C/O SOB, HANDS AND ANKLES SWELLING      History of Present Illness: 59 y.o. female with history of asthma, depression, GERD, fibromyalgia and CAD who is here today for cardiac follow up. [facility] 04/2008 demonstrated 80% stenosis in the mid RCA treated with a Xience DES. Patient was noted to have possible SVT during cardiac rehabilitation 05/2008. Re-look LHC 05/2008: Proximal RCA 25%, mid RCA stent patent. Holter 03/2009: NSR, PACs and PVCs. Liver biopsy 2011 reportedly normal (steatohepatitis). She has not tolerated statins in the past. Stress myoview on  11/21/12 showed a reversible basal inferior perfusion defect. She described class III angina on her f/u in our office 12/02/12. Cardiac cath 12/06/12 with severe stenosis in the mid RCA just before the old stent which also had restenosis. I treated the entire segment with a 2.75 x 28 mm Promus Premier DES. This was post-dilated to 3.0 mm. She had recurrent chest pain and was seen in the office 05/05/13. Cardiac cath 05/08/13 with severe stenosis mid LAD. A 2.5 x 16 mm Promus DES was deployed in the mid LAD. She also had c/o palpitations. She called our office in August 2015 c/o tachycardia, heart racing. Lopressor 12.5 mg po BID started. 24 hour monitor November 2015 with PACs/PVCs and 4 beat run SVT.  Last cath November 2015 with stable CAD.   She is here today for follow up. She has no chest pain. She c/o fatigue and some dyspnea. She has occasional palpitations, no prolonged runs. She overall is feeling well. She did have some hand edema a few weeks ago.   Primary Care Physician: Kathryne Hitch  Last Lipid Profile: Followed in primary care.   Past Medical History  Diagnosis Date  . Coronary artery disease     a. s/p Xience DES to [facility] 04/2008;  b. LHC 05/2008: Proximal RCA 25%, mid RCA stent patent;  c. Lexiscan Myoview (9/14):  Low risk, small mild partially reversible basal inferior  perfusion defect, EF 61%, no rwma;  d. 04/2013 Cath/PCI: LM nl, LAD 72m2.25x16 Promus Premier DES), LCX nl, RCA 20p, patent mid stent, PDA 90ost (small), EF 40% sev HK of ant wall to apex.  . Hyperlipidemia     intol to statins and other chol agents due to elevated LFTs  . GERD (gastroesophageal reflux disease)   . Fibromyalgia   . Asthma   . Pneumonia     "couple times; not recently" (12/06/2012)  . [place]     "used to get it yearly; not so much since pneu shots" (12/06/2012)  . Shortness of breath     "all the time last month or so" (12/06/2012)  . Anemia     "long time ago" (12/06/2012)  . H/O hiatal hernia   . Hepatitis     "not A, B, or C" (12/06/2012)  . Sinus headache     "alot" (12/06/2012)  . Migraines     "when I was younger" (12/06/2012)  . Arthritis     "right knee real bad; in my hands bad" (12/06/2012)  . Depression     "years ago" (12/06/2012)  . Anginal pain ([place]   . Hypertension     Past Surgical History  Procedure Laterality Date  . Cardiac catheterization  04/2008; 05/2008  . Coronary angioplasty with stent placement  04/2008 12/06/2012    "1 + 1" (12/06/2012)  . Cholecystectomy    . Vaginal hysterectomy    .  Tubal ligation    . Coronary stent placement  05/08/2013    DES TO LAD      DR [facility]  . Left heart catheterization with coronary angiogram N/A 12/06/2012    Procedure: LEFT HEART CATHETERIZATION WITH CORONARY ANGIOGRAM;  Surgeon: [name], MD;  [facility];  Service: Cardiovascular;  Laterality: N/A;  . Percutaneous coronary stent intervention (pci-s)  12/06/2012    Procedure: PERCUTANEOUS CORONARY STENT INTERVENTION (PCI-S);  Surgeon: [name], MD;  Location: [facility] C[facility] LAB;  Service: Cardiovascular;;  . Left heart catheterization with coronary angiogram N/A 05/08/2013    Procedure: LEFT HEART CATHETERIZATION WITH CORONARY ANGIOGRAM;  Surgeon: [name], MD;  [facility] LAB;  Service:  Cardiovascular;  Laterality: N/A;  . Percutaneous coronary stent intervention (pci-s)  05/08/2013    Procedure: PERCUTANEOUS CORONARY STENT INTERVENTION (PCI-S);  Surgeon: [name], MD;  Location: [facility] LAB;  Service: Cardiovascular;;  mid LAD   . Left heart catheterization with coronary angiogram N/A 01/04/2014    Procedure: LEFT HEART CATHETERIZATION WITH CORONARY ANGIOGRAM;  Surgeon: [name], MD;  Location: [facility] C[facility] LAB;  Service: Cardiovascular;  Laterality: N/A;    Current Outpatient Prescriptions  Medication Sig Dispense Refill  . albuterol (PROVENTIL HFA;VENTOLIN HFA) 108 (90 BASE) MCG/ACT inhaler Inhale 2 puffs into the lungs every 6 (six) hours as needed for wheezing.    Marland Kitchen albuterol (PROVENTIL) (5 MG/ML) 0.5% nebulizer solution Take 2.5 mg by nebulization every 6 (six) hours as needed for wheezing.    Marland Kitchen aspirin 81 MG chewable tablet Chew 1 tablet (81 mg total) by mouth daily.    Marland Kitchen buPROPion (WELLBUTRIN SR) 150 MG 12 hr tablet Take 150 mg by mouth 2 (two) times daily.    . clopidogrel (PLAVIX) 75 MG tablet Take 1 tablet (75 mg total) by mouth daily. 30 tablet 11  . DULoxetine (CYMBALTA) 60 MG capsule Take 40 mg by mouth daily.  0  . ergocalciferol (VITAMIN D2) 50000 UNITS capsule Take 50,000 Units by mouth once a week. wednesday    . HYDROcodone-acetaminophen (NORCO) 7.5-325 MG per tablet Take 1 tablet by mouth every 6 (six) hours as needed for pain.    Marland Kitchen LORazepam (ATIVAN) 2 MG tablet Take 2 mg by mouth at bedtime.     . metoprolol tartrate (LOPRESSOR) 25 MG tablet Take 0.5 tablets (12.5 mg total) by mouth 2 (two) times daily. 30 tablet 11  . montelukast (SINGULAIR) 10 MG tablet Take 10 mg by mouth at bedtime as needed (for allergies).     . nitroGLYCERIN (NITROSTAT) 0.4 MG SL tablet Place 1 tablet (0.4 mg total) under the tongue every 5 (five) minutes as needed for chest pain. 25 tablet 12  . QVAR 80 MCG/ACT inhaler Inhale 1 puff into the lungs as needed.  0    . temazepam (RESTORIL) 15 MG capsule Take 15 mg by mouth daily.  0   No current facility-administered medications for this visit.    Allergies  Allergen Reactions  . Statins Other (See Comments)    Liver enzymes  . Tricor [Fenofibrate] Hives and Itching  . Ciprofloxacin Hives  . Codeine Nausea And Vomiting  . Metoprolol Other (See Comments)    Does not tolerate beta blockers 10/02/2013-pt states she can tolerate 25 mg and lower     Social History   Social History  . Marital Status: Married    Spouse Name: N/A  . Number of Children: N/A  .  Years of Education: N/A   Occupational History  . Not on file.   Social History Main Topics  . Smoking status: Former Smoker -- 0.50 packs/day for 30 years    Types: Cigarettes  . Smokeless tobacco: Never Used     Comment: 12/06/2012 "quit smoking cigarettes ~ 8 yr ago"  . Alcohol Use: No  . Drug Use: No  . Sexual Activity: Yes   Other Topics Concern  . Not on file   Social History Narrative    Family History: No CAD  Review of Systems:  As stated in the HPI and otherwise negative.   BP 102/70 mmHg  Pulse 93  Ht 5' 7"  (1.702 m)  Wt 159 lb (72.122 kg)  BMI 24.90 kg/m2  SpO2 96%  Physical Examination: General: Well developed, well nourished, NAD HEENT: OP clear, mucus membranes moist SKIN: warm, dry. No rashes. Neuro: No focal deficits Musculoskeletal: Muscle strength 5/5 all ext Psychiatric: Mood and affect normal Neck: No JVD, no carotid bruits, no thyromegaly, no lymphadenopathy. Lungs:Clear bilaterally, no wheezes, rhonci, crackles Cardiovascular: Regular rate and rhythm. No murmurs, gallops or rubs. Abdomen:Soft. Bowel sounds present. Non-tender.  Extremities: No lower extremity edema. Pulses are 2 + in the bilateral DP/PT.  Stress myoview 11/21/12: Stress Procedure: The patient received IV Lexiscan 0.4 mg over 15-seconds. Technetium 55mSestamibi injected at 30-seconds. This patient had sob and felt funny with  the Lexiscan injection. Quantitative spect images were obtained after a 45 minute delay.  Stress ECG: No significant change from baseline ECG  QPS  Raw Data Images: Normal; no motion artifact; normal heart/lung ratio.  Stress Images: Small, mild basal inferior perfusion defect.  Rest Images: Small, mild basal inferior perfusion defect. Defect is somewhat less prominent than with stress.  Subtraction (SDS): Primarily fixed small mild basal inferior perfusion defect.  Transient Ischemic Dilatation (Normal <1.22): n/a  Lung/Heart Ratio (Normal <0.45): 0.37  Quantitative Gated Spect Images  QGS EDV: 74 ml  QGS ESV: 29 ml  Impression  Exercise Capacity: Lexiscan with no exercise.  BP Response: Normal blood pressure response.  Clinical Symptoms: Short of breath.  ECG Impression: No significant ST segment change suggestive of ischemia.  Comparison with Prior Nuclear Study: No images to compare  Overall Impression: Low risk stress nuclear study with a small, mild partially reversible basal inferior perfusion defect. Cannot rule out small area of ischemia but may be soft tissue attenuation. .  LV Ejection Fraction: 61%. LV Wall Motion: NL LV Function; NL Wall Motion  Cardiac cath November 2015: Left main: No obstructive disease.  Left Anterior Descending Artery: Large caliber vessel that courses to the apex. The mid vessel has a patent stented segment with no restenosis. The distal vessel has diffuse non-obstructive plaque. The moderate caliber diagonal branch has mild plaque disease.  Circumflex Artery: Large caliber vessel with two small caliber obtuse marginal branches followed by a third moderate caliber, bifurcating obtuse marginal branch. No obstructive disease.  Right Coronary Artery: Large, dominant vessel with 20% proximal stenosis. The mid vessel has a patent stented segment with no restenosis. The PDA is small in caliber with ostial 60% stenosis (1.75 mm vessel) and too small for PCI.    Left Ventricular Angiogram: LVEF=55-60%  EKG:  EKG is ordered today. The ekg ordered today demonstrates NSR, rate 93 bpm  Recent Labs: No results found for requested labs within last 365 days.   Lipid Panel    Component Value Date/Time   CHOL * 02/04/2008 0215  267        ATP III CLASSIFICATION:  <200     mg/dL   Desirable  200-239  mg/dL   Borderline High  >=240    mg/dL   High   TRIG 685* 02/04/2008 0215   HDL 26* 02/04/2008 0215   CHOLHDL 10.3 02/04/2008 0215   VLDL UNABLE TO CALCULATE IF TRIGLYCERIDE OVER 400 mg/dL 02/04/2008 0215   LDLCALC  02/04/2008 0215    UNABLE TO CALCULATE IF TRIGLYCERIDE OVER 400 mg/dL        Total Cholesterol/HDL:CHD Risk Coronary Heart Disease Risk Table                     Men   Women  1/2 Average Risk   3.4   3.3     Wt Readings from Last 3 Encounters:  02/11/15 159 lb (72.122 kg)  02/12/14 162 lb (73.483 kg)  01/04/14 149 lb (67.586 kg)     Other studies Reviewed: Additional studies/ records that were reviewed today include: . Review of the above records demonstrates:    Assessment and Plan:   1. CAD: s/p PCI with DES mid LAD 05/08/13. Last cath November 2015 with stable CAD. Continue ASA and Plavix along with beta blocker. She does not tolerate statins.   2. Palpitations/PAC/PVC: PACs/PVCs with short run of SVT (4 beats) on cardiac monitor November 2015. She has rare palpitations. Continue beta blocker.   3. Hyperlipidemia: She does not tolerate statins. Lipids are followed in primary care.   Current medicines are reviewed at length with the patient today.  The patient does not have concerns regarding medicines.  The following changes have been made:  no change  Labs/ tests ordered today include:   Orders Placed This Encounter  Procedures  . EKG 12-Lead    Disposition:   FU with me in 12  months  Signed, Lauree Chandler, MD 02/11/2015 3:22 PM    [facility] [place], [place],  [place]  [address] Phone: [phone]; Fax: [phone]

## 2015-07-30 ENCOUNTER — Encounter: Payer: Self-pay | Admitting: Internal Medicine

## 2015-07-30 ENCOUNTER — Telehealth: Payer: Self-pay | Admitting: Cardiovascular Disease

## 2015-07-30 NOTE — Telephone Encounter (Signed)
Patient stated she just has some SOB with activity. Patient denies any chest pain. Patient stated she has been getting over strep throat, that she got a month ago. Patient stated she has an appointment with Dr. Benna Dunks, PCP, tomorrow morning, and she will wait to see what her PCP has to say. Encouraged patient if she starts to have any chest pain to go to ED, especially with her history. Patient verbalized understanding and will call with any other questions or concerns. Will forward to Dr. Angelena Form so he is aware.

## 2015-07-30 NOTE — Telephone Encounter (Signed)
Agree. Thanks, chris

## 2015-07-30 NOTE — Telephone Encounter (Signed)
New Message  Pt c/o Shortness Of Breath: STAT if SOB developed within the last 24 hours or pt is noticeably SOB on the phone  1. Are you currently SOB (can you hear that pt is SOB on the phone)? Yes  A little bit. She can take her breathing treatment it gets better but it doesn't go away.   2. How long have you been experiencing SOB?  awhile now because she went to the PCP a month ago was given antibiotics but nothing has really changed.   3. Are you SOB when sitting or when up moving around? Mostly when she is moving around. But sitting from time to time as well. Right now her allergies are acting up as well so she is really not sure,   4.  Are you currently experiencing any other symptoms? No nothing other than being fairly weak.   Comments: Pt believes that she is asthmatic so she has has an appt with PCP for 07/31/2015 at 10am. Appt made to see Eileen Stanford, PA-C on 08/12/2015 at 11 am.

## 2015-07-31 ENCOUNTER — Telehealth: Payer: Self-pay | Admitting: Cardiovascular Disease

## 2015-07-31 NOTE — Telephone Encounter (Signed)
Spoke with pt. She states she saw primary care and chest X-ray was OK.  She was advised to call for sooner appt than June 12,2017.  She is not having any chest pain.  This week she would need a Thursday afternoon or Friday morning appt.  She would prefer to see Dr. Angelena Form and she thinks an appt on June 5,2017 would be OK.  Appt made for pt to see Dr. Angelena Form on June 5,2017 at 2:00.

## 2015-07-31 NOTE — Telephone Encounter (Signed)
F/u   Pt requested a call back from the RN regarding call from this morning 5/31

## 2015-08-01 ENCOUNTER — Encounter: Payer: Self-pay | Admitting: Cardiovascular Disease

## 2015-08-05 ENCOUNTER — Ambulatory Visit (INDEPENDENT_AMBULATORY_CARE_PROVIDER_SITE_OTHER): Payer: Managed Care, Other (non HMO) | Admitting: Cardiovascular Disease

## 2015-08-05 ENCOUNTER — Encounter: Payer: Self-pay | Admitting: Cardiovascular Disease

## 2015-08-05 ENCOUNTER — Encounter: Payer: Self-pay | Admitting: *Deleted

## 2015-08-05 VITALS — BP 140/90 | HR 100 | Ht 66.0 in | Wt 159.1 lb

## 2015-08-05 DIAGNOSIS — E785 Hyperlipidemia, unspecified: Secondary | ICD-10-CM

## 2015-08-05 DIAGNOSIS — I491 Atrial premature depolarization: Secondary | ICD-10-CM

## 2015-08-05 DIAGNOSIS — R0600 Dyspnea, unspecified: Secondary | ICD-10-CM

## 2015-08-05 DIAGNOSIS — I200 Unstable angina: Secondary | ICD-10-CM | POA: Diagnosis not present

## 2015-08-05 LAB — CBC
HCT: 44.3 % (ref 35.0–45.0)
Hemoglobin: 14.6 g/dL (ref 11.7–15.5)
MCH: 28.9 pg (ref 27.0–33.0)
MCHC: 33 g/dL (ref 32.0–36.0)
MCV: 87.7 fL (ref 80.0–100.0)
MPV: 9.8 fL (ref 7.5–12.5)
PLATELETS: 297 10*3/uL (ref 140–400)
RBC: 5.05 MIL/uL (ref 3.80–5.10)
RDW: 14.3 % (ref 11.0–15.0)
WBC: 13.2 10*3/uL — ABNORMAL HIGH (ref 3.8–10.8)

## 2015-08-05 LAB — BASIC METABOLIC PANEL
BUN: 18 mg/dL (ref 7–25)
CHLORIDE: 102 mmol/L (ref 98–110)
CO2: 27 mmol/L (ref 20–31)
CREATININE: 0.68 mg/dL (ref 0.50–0.99)
Calcium: 9.5 mg/dL (ref 8.6–10.4)
Glucose, Bld: 106 mg/dL — ABNORMAL HIGH (ref 65–99)
Potassium: 4.1 mmol/L (ref 3.5–5.3)
Sodium: 141 mmol/L (ref 135–146)

## 2015-08-05 LAB — PROTIME-INR
INR: 0.89 (ref <1.50)
Prothrombin Time: 12.1 seconds (ref 11.6–15.2)

## 2015-08-05 NOTE — Progress Notes (Signed)
Chief Complaint  Patient presents with  . Shortness of Breath  . Chest Pain      History of Present Illness: 60 y.o. female with history of asthma, depression, GERD, fibromyalgia and CAD who is here today for cardiac follow up. [place] 04/2008 demonstrated 80% stenosis in the mid RCA treated with a Xience DES. Patient was noted to have possible SVT during cardiac rehabilitation 05/2008. Re-look LHC 05/2008: Proximal RCA 25%, mid RCA stent patent. Holter 03/2009: NSR, PACs and PVCs. Liver biopsy 2011 reportedly normal (steatohepatitis). Stress myoview on  11/21/12 showed a reversible basal inferior perfusion defect. She described class III angina on her f/u in our office 12/02/12. Cardiac cath 12/06/12 with severe stenosis in the mid RCA just before the old stent which also had restenosis. I treated the entire segment with a 2.75 x 28 mm Promus Premier DES. This was post-dilated to 3.0 mm. She had recurrent chest pain and was seen in the office 05/05/13. Cardiac cath 05/08/13 with severe stenosis mid LAD. A 2.5 x 16 mm Promus DES was deployed in the mid LAD. She also had c/o palpitations. She called our office in August 2015 c/o tachycardia, heart racing. Lopressor 12.5 mg po BID started. 24 hour monitor November 2015 with PACs/PVCs and 4 beat run SVT.  Last cath November 2015 with stable CAD.   She is here today for follow up. She describes dyspnea with minimal exertion. There is associated chest pressure. This is similar to prior angina. She has been seen in primary care last week and chest x-ray was normal per pt. No weight gain. She has daily palpitations.   Primary Care Physician: Imelda Pillow, NP  Last Lipid Profile: Followed in primary care.   Past Medical History  Diagnosis Date  . Coronary artery disease     a. s/p Xience DES to [facility] 04/2008;  b. LHC 05/2008: Proximal RCA 25%, mid RCA stent patent;  c. Lexiscan Myoview (9/14):  Low risk, small mild partially reversible basal inferior  perfusion defect, EF 61%, no rwma;  d. 04/2013 Cath/PCI: LM nl, LAD 4m2.25x16 Promus Premier DES), LCX nl, RCA 20p, patent mid stent, PDA 90ost (small), EF 40% sev HK of ant wall to apex.  . Hyperlipidemia     intol to statins and other chol agents due to elevated LFTs  . GERD (gastroesophageal reflux disease)   . Fibromyalgia   . Asthma   . Pneumonia     "couple times; not recently" (12/06/2012)  . Chronic bronchitis (HAlligator     "used to get it yearly; not so much since pneu shots" (12/06/2012)  . Shortness of breath     "all the time last month or so" (12/06/2012)  . Anemia     "long time ago" (12/06/2012)  . H/O hiatal hernia   . Hepatitis     "not A, B, or C" (12/06/2012)  . Sinus headache     "alot" (12/06/2012)  . Migraines     "when I was younger" (12/06/2012)  . Arthritis     "right knee real bad; in my hands bad" (12/06/2012)  . Depression     "years ago" (12/06/2012)  . Anginal pain (HVillarreal   . Hypertension     Past Surgical History  Procedure Laterality Date  . Cardiac catheterization  04/2008; 05/2008  . Coronary angioplasty with stent placement  04/2008 12/06/2012    "1 + 1" (12/06/2012)  . Cholecystectomy    . Vaginal hysterectomy    .  Tubal ligation    . Coronary stent placement  05/08/2013    DES TO LAD      DR [facility]  . Left heart catheterization with coronary angiogram N/A 12/06/2012    Procedure: LEFT HEART CATHETERIZATION WITH CORONARY ANGIOGRAM;  Surgeon: [name], MD;  Location: [facility] CATH LAB;  Service: Cardiovascular;  Laterality: N/A;  . Percutaneous coronary stent intervention (pci-s)  12/06/2012    Procedure: PERCUTANEOUS CORONARY STENT INTERVENTION (PCI-S);  Surgeon: [name], MD;  Location: [facility] CATH LAB;  Service: Cardiovascular;;  . Left heart catheterization with coronary angiogram N/A 05/08/2013    Procedure: LEFT HEART CATHETERIZATION WITH CORONARY ANGIOGRAM;  Surgeon: [name], MD;  Location: [facility] CATH LAB;  Service:  Cardiovascular;  Laterality: N/A;  . Percutaneous coronary stent intervention (pci-s)  05/08/2013    Procedure: PERCUTANEOUS CORONARY STENT INTERVENTION (PCI-S);  Surgeon: [name], MD;  Location: [facility] CATH LAB;  Service: Cardiovascular;;  mid LAD   . Left heart catheterization with coronary angiogram N/A 01/04/2014    Procedure: LEFT HEART CATHETERIZATION WITH CORONARY ANGIOGRAM;  Surgeon: [name], MD;  Location: [facility] CATH LAB;  Service: Cardiovascular;  Laterality: N/A;    Current Outpatient Prescriptions  Medication Sig Dispense Refill  . albuterol (PROVENTIL HFA;VENTOLIN HFA) 108 (90 BASE) MCG/ACT inhaler Inhale 2 puffs into the lungs every 6 (six) hours as needed for wheezing.    Marland Kitchen albuterol (PROVENTIL) (5 MG/ML) 0.5% nebulizer solution Take 2.5 mg by nebulization every 6 (six) hours as needed for wheezing.    Marland Kitchen aspirin 81 MG chewable tablet Chew 1 tablet (81 mg total) by mouth daily.    Marland Kitchen buPROPion (WELLBUTRIN SR) 150 MG 12 hr tablet Take 150 mg by mouth 2 (two) times daily.    . clopidogrel (PLAVIX) 75 MG tablet Take 1 tablet (75 mg total) by mouth daily. 30 tablet 11  . DULoxetine (CYMBALTA) 60 MG capsule Take 40 mg by mouth daily.  0  . ergocalciferol (VITAMIN D2) 50000 UNITS capsule Take 50,000 Units by mouth once a week. wednesday    . HYDROcodone-acetaminophen (NORCO) 7.5-325 MG per tablet Take 1 tablet by mouth every 6 (six) hours as needed for pain.    Marland Kitchen LORazepam (ATIVAN) 2 MG tablet Take 2 mg by mouth at bedtime.     . methylPREDNISolone (MEDROL DOSEPAK) 4 MG TBPK tablet Take 4 mg by mouth as directed.  0  . metoprolol tartrate (LOPRESSOR) 25 MG tablet Take 0.5 tablets (12.5 mg total) by mouth 2 (two) times daily. 30 tablet 11  . montelukast (SINGULAIR) 10 MG tablet Take 10 mg by mouth at bedtime as needed (for allergies).     . nitroGLYCERIN (NITROSTAT) 0.4 MG SL tablet Place 1 tablet (0.4 mg total) under the tongue every 5 (five) minutes as needed for  chest pain. 25 tablet 12  . QVAR 80 MCG/ACT inhaler Inhale 1 puff into the lungs at bedtime.   0  . temazepam (RESTORIL) 15 MG capsule Take 15 mg by mouth daily.  0   No current facility-administered medications for this visit.    Allergies  Allergen Reactions  . Statins Other (See Comments)    Liver enzymes  . Tricor [Fenofibrate] Hives and Itching  . Ciprofloxacin Hives  . Codeine Nausea And Vomiting  . Metoprolol Other (See Comments)    Does not tolerate beta blockers 10/02/2013-pt states she can tolerate 25 mg and lower     Social History   Social History  .  Marital Status: Married    Spouse Name: N/A  . Number of Children: N/A  . Years of Education: N/A   Occupational History  . Not on file.   Social History Main Topics  . Smoking status: Former Smoker -- 0.50 packs/day for 30 years    Types: Cigarettes  . Smokeless tobacco: Never Used     Comment: 12/06/2012 "quit smoking cigarettes ~ 8 yr ago"  . Alcohol Use: No  . Drug Use: No  . Sexual Activity: Yes   Other Topics Concern  . Not on file   Social History Narrative    Family History: No CAD  Review of Systems:  As stated in the HPI and otherwise negative.   BP 140/90 mmHg  Pulse 100  Ht 5' 6"  (1.676 m)  Wt 159 lb 1.9 oz (72.176 kg)  BMI 25.69 kg/m2  Physical Examination: General: Well developed, well nourished, NAD HEENT: OP clear, mucus membranes moist SKIN: warm, dry. No rashes. Neuro: No focal deficits Musculoskeletal: Muscle strength 5/5 all ext Psychiatric: Mood and affect normal Neck: No JVD, no carotid bruits, no thyromegaly, no lymphadenopathy. Lungs:Clear bilaterally, no wheezes, rhonci, crackles Cardiovascular: Regular rate and rhythm. No murmurs, gallops or rubs. Abdomen:Soft. Bowel sounds present. Non-tender.  Extremities: No lower extremity edema. Pulses are 2 + in the bilateral DP/PT.  Stress myoview 11/21/12: Stress Procedure: The patient received IV Lexiscan 0.4 mg over  15-seconds. Technetium 53mSestamibi injected at 30-seconds. This patient had sob and felt funny with the Lexiscan injection. Quantitative spect images were obtained after a 45 minute delay.  Stress ECG: No significant change from baseline ECG  QPS  Raw Data Images: Normal; no motion artifact; normal heart/lung ratio.  Stress Images: Small, mild basal inferior perfusion defect.  Rest Images: Small, mild basal inferior perfusion defect. Defect is somewhat less prominent than with stress.  Subtraction (SDS): Primarily fixed small mild basal inferior perfusion defect.  Transient Ischemic Dilatation (Normal <1.22): n/a  Lung/Heart Ratio (Normal <0.45): 0.37  Quantitative Gated Spect Images  QGS EDV: 74 ml  QGS ESV: 29 ml  Impression  Exercise Capacity: Lexiscan with no exercise.  BP Response: Normal blood pressure response.  Clinical Symptoms: Short of breath.  ECG Impression: No significant ST segment change suggestive of ischemia.  Comparison with Prior Nuclear Study: No images to compare  Overall Impression: Low risk stress nuclear study with a small, mild partially reversible basal inferior perfusion defect. Cannot rule out small area of ischemia but may be soft tissue attenuation. .  LV Ejection Fraction: 61%. LV Wall Motion: NL LV Function; NL Wall Motion  Cardiac cath November 2015: Left main: No obstructive disease.  Left Anterior Descending Artery: Large caliber vessel that courses to the apex. The mid vessel has a patent stented segment with no restenosis. The distal vessel has diffuse non-obstructive plaque. The moderate caliber diagonal branch has mild plaque disease.  Circumflex Artery: Large caliber vessel with two small caliber obtuse marginal branches followed by a third moderate caliber, bifurcating obtuse marginal branch. No obstructive disease.  Right Coronary Artery: Large, dominant vessel with 20% proximal stenosis. The mid vessel has a patent stented segment with no  restenosis. The PDA is small in caliber with ostial 60% stenosis (1.75 mm vessel) and too small for PCI.  Left Ventricular Angiogram: LVEF=55-60%  EKG:  EKG is ordered today. The ekg ordered today demonstrates NSR, rate 100 bpm. PVC, PACs  Recent Labs: No results found for requested labs within last 365 days.  Lipid Panel Followed in primary care   Wt Readings from Last 3 Encounters:  08/05/15 159 lb 1.9 oz (72.176 kg)  02/11/15 159 lb (72.122 kg)  02/12/14 162 lb (73.483 kg)     Other studies Reviewed: Additional studies/ records that were reviewed today include: . Review of the above records demonstrates:    Assessment and Plan:   1. CAD: She has dyspnea and chest pain c/w prior angina. She is s/p PCI of the LAD and RCA. Last cath November 2015 with stable CAD. Given her recent symptoms, will plan cardiac cath this week to exclude progression of CAD. I am out of town and will schedule with DR. Cooper. Risks and benefits of cath reviewed with pt. She agrees to proceed. Pre-cath labs today. Continue ASA and Plavix along with beta blocker. She does not tolerate statins. Referral to lipid clinic (see below). Will arrange echo to assess LVEF, exclude valvular disease.   2. Palpitations/PAC/PVC: PACs/PVCs with short run of SVT (4 beats) on cardiac monitor November 2015. PVC and PACs on EKG today. She has rare palpitations. Continue beta blocker.   3. Hyperlipidemia: She does not tolerate statins due to muscle pains/weakness and abnormal LFTs. Will refer to Lipid clinic to discuss PCSK9 inh injections.    Current medicines are reviewed at length with the patient today.  The patient does not have concerns regarding medicines.  The following changes have been made:  no change  Labs/ tests ordered today include:   Orders Placed This Encounter  Procedures  . Basic Metabolic Panel (BMET)  . CBC  . INR/PT  . EKG 12-Lead  . ECHOCARDIOGRAM COMPLETE    Disposition:   FU with me in 1   months  Signed, Lauree Chandler, MD 08/05/2015 2:47 PM    [address], [place], [place]  [address] Phone: [phone]; Fax: [phone]

## 2015-08-05 NOTE — Patient Instructions (Addendum)
Medication Instructions:  Your physician recommends that you continue on your current medications as directed. Please refer to the Current Medication list given to you today.   Labwork: Lab work to be done [employer], PT  Testing/Procedures: Your physician has requested that you have an echocardiogram. Echocardiography is a painless test that uses sound waves to create images of your heart. It provides your doctor with information about the size and shape of your heart and how well your heart's chambers and valves are working. This procedure takes approximately one hour. There are no restrictions for this procedure.  Your physician has requested that you have a cardiac catheterization. Cardiac catheterization is used to diagnose and/or treat various heart conditions. Doctors may recommend this procedure for a number of different reasons. The most common reason is to evaluate chest pain. Chest pain can be a symptom of coronary artery disease (CAD), and cardiac catheterization can show whether plaque is narrowing or blocking your heart's arteries. This procedure is also used to evaluate the valves, as well as measure the blood flow and oxygen levels in different parts of your heart. For further information please visit [email or web address]. Please follow instruction sheet, as given.  You have been referred to the lipid clinic. Please schedule patient for new patient appt.    Follow-Up: Your physician recommends that you schedule a follow-up appointment in: one month with PA or NP    Any Other Special Instructions Will Be Listed Below (If Applicable).     If you need a refill on your cardiac medications before your next appointment, please call your pharmacy.

## 2015-08-07 ENCOUNTER — Ambulatory Visit (HOSPITAL_COMMUNITY)
Admission: RE | Admit: 2015-08-07 | Discharge: 2015-08-07 | Disposition: A | Discharge status: Home / Self Care | Payer: Managed Care, Other (non HMO) | Source: Ambulatory Visit | Attending: Cardiovascular Disease | Admitting: Cardiovascular Disease

## 2015-08-07 ENCOUNTER — Encounter (HOSPITAL_COMMUNITY)
Admission: RE | Disposition: A | Discharge status: Home / Self Care | Payer: Self-pay | Source: Ambulatory Visit | Attending: Cardiovascular Disease

## 2015-08-07 DIAGNOSIS — M797 Fibromyalgia: Secondary | ICD-10-CM | POA: Insufficient documentation

## 2015-08-07 DIAGNOSIS — I10 Essential (primary) hypertension: Secondary | ICD-10-CM | POA: Insufficient documentation

## 2015-08-07 DIAGNOSIS — F329 Major depressive disorder, single episode, unspecified: Secondary | ICD-10-CM | POA: Diagnosis not present

## 2015-08-07 DIAGNOSIS — Z955 Presence of coronary angioplasty implant and graft: Secondary | ICD-10-CM | POA: Insufficient documentation

## 2015-08-07 DIAGNOSIS — M1990 Unspecified osteoarthritis, unspecified site: Secondary | ICD-10-CM | POA: Diagnosis not present

## 2015-08-07 DIAGNOSIS — J449 Chronic obstructive pulmonary disease, unspecified: Secondary | ICD-10-CM | POA: Insufficient documentation

## 2015-08-07 DIAGNOSIS — I200 Unstable angina: Secondary | ICD-10-CM

## 2015-08-07 DIAGNOSIS — I25110 Atherosclerotic heart disease of native coronary artery with unstable angina pectoris: Secondary | ICD-10-CM | POA: Diagnosis present

## 2015-08-07 DIAGNOSIS — J45909 Unspecified asthma, uncomplicated: Secondary | ICD-10-CM | POA: Diagnosis not present

## 2015-08-07 DIAGNOSIS — Z87891 Personal history of nicotine dependence: Secondary | ICD-10-CM | POA: Insufficient documentation

## 2015-08-07 DIAGNOSIS — K219 Gastro-esophageal reflux disease without esophagitis: Secondary | ICD-10-CM | POA: Insufficient documentation

## 2015-08-07 DIAGNOSIS — E785 Hyperlipidemia, unspecified: Secondary | ICD-10-CM | POA: Diagnosis not present

## 2015-08-07 DIAGNOSIS — I471 Supraventricular tachycardia: Secondary | ICD-10-CM | POA: Diagnosis not present

## 2015-08-07 DIAGNOSIS — I493 Ventricular premature depolarization: Secondary | ICD-10-CM | POA: Insufficient documentation

## 2015-08-07 DIAGNOSIS — Z7902 Long term (current) use of antithrombotics/antiplatelets: Secondary | ICD-10-CM | POA: Diagnosis not present

## 2015-08-07 DIAGNOSIS — Z7982 Long term (current) use of aspirin: Secondary | ICD-10-CM | POA: Insufficient documentation

## 2015-08-07 HISTORY — PX: CARDIAC CATHETERIZATION: SHX172

## 2015-08-07 SURGERY — LEFT HEART CATH AND CORONARY ANGIOGRAPHY

## 2015-08-07 MED ORDER — SODIUM CHLORIDE 0.9% FLUSH: 3.0000 mL | INTRAVENOUS | Status: DC | PRN

## 2015-08-07 MED ORDER — LIDOCAINE HCL (PF) 1 % IJ SOLN
INTRAMUSCULAR | Status: DC | PRN
  Administered 2015-08-07: 15 mL

## 2015-08-07 MED ORDER — FENTANYL CITRATE (PF) 100 MCG/2ML IJ SOLN
INTRAMUSCULAR | Status: AC
  Filled 2015-08-07: qty 2

## 2015-08-07 MED ORDER — LIDOCAINE HCL (PF) 1 % IJ SOLN
INTRAMUSCULAR | Status: AC
  Filled 2015-08-07: qty 30

## 2015-08-07 MED ORDER — FENTANYL CITRATE (PF) 100 MCG/2ML IJ SOLN
INTRAMUSCULAR | Status: DC | PRN
  Administered 2015-08-07 (×2): 25 ug via INTRAVENOUS

## 2015-08-07 MED ORDER — SODIUM CHLORIDE 0.9 % IV SOLN
INTRAVENOUS | Status: AC
  Administered 2015-08-07: 11:00:00 via INTRAVENOUS

## 2015-08-07 MED ORDER — ASPIRIN 81 MG PO CHEW: 81.0000 mg | CHEWABLE_TABLET | ORAL | Status: DC

## 2015-08-07 MED ORDER — SODIUM CHLORIDE 0.9 % WEIGHT BASED INFUSION: 3.0000 mL/kg/h | INTRAVENOUS | Status: DC

## 2015-08-07 MED ORDER — HEPARIN (PORCINE) IN NACL 2-0.9 UNIT/ML-% IJ SOLN
INTRAMUSCULAR | Status: DC | PRN
  Administered 2015-08-07: 1000 mL

## 2015-08-07 MED ORDER — MIDAZOLAM HCL 2 MG/2ML IJ SOLN
INTRAMUSCULAR | Status: DC | PRN
  Administered 2015-08-07 (×2): 2 mg via INTRAVENOUS

## 2015-08-07 MED ORDER — MIDAZOLAM HCL 2 MG/2ML IJ SOLN
INTRAMUSCULAR | Status: AC
  Filled 2015-08-07: qty 2

## 2015-08-07 MED ORDER — IOPAMIDOL (ISOVUE-370) INJECTION 76%
INTRAVENOUS | Status: DC | PRN
  Administered 2015-08-07: 65 mL via INTRAVENOUS

## 2015-08-07 MED ORDER — HEPARIN (PORCINE) IN NACL 2-0.9 UNIT/ML-% IJ SOLN
INTRAMUSCULAR | Status: AC
  Filled 2015-08-07: qty 1000

## 2015-08-07 MED ORDER — ONDANSETRON HCL 4 MG/2ML IJ SOLN: 4.0000 mg | Freq: Four times a day (QID) | INTRAMUSCULAR | Status: DC | PRN

## 2015-08-07 MED ORDER — SODIUM CHLORIDE 0.9% FLUSH: 3.0000 mL | Freq: Two times a day (BID) | INTRAVENOUS | Status: DC

## 2015-08-07 MED ORDER — SODIUM CHLORIDE 0.9 % IV SOLN: 250.0000 mL | INTRAVENOUS | Status: DC | PRN

## 2015-08-07 MED ORDER — SODIUM CHLORIDE 0.9% FLUSH
3.0000 mL | INTRAVENOUS | Status: DC | PRN
Start: 2015-08-07 — End: 2015-08-07

## 2015-08-07 MED ORDER — ACETAMINOPHEN 325 MG PO TABS: 650.0000 mg | ORAL_TABLET | ORAL | Status: DC | PRN

## 2015-08-07 MED ORDER — IOPAMIDOL (ISOVUE-370) INJECTION 76%
INTRAVENOUS | Status: AC
  Filled 2015-08-07: qty 100

## 2015-08-07 SURGICAL SUPPLY — 12 items
CATH INFINITI 5FR MULTPACK ANG (CATHETERS) ×1 IMPLANT
DEVICE CLOSURE PERCLS PRGLD 6F (VASCULAR PRODUCTS) IMPLANT
GLIDESHEATH SLEND SS 6F .021 (SHEATH) IMPLANT
KIT HEART LEFT (KITS) ×2 IMPLANT
PACK CARDIAC CATHETERIZATION (CUSTOM PROCEDURE TRAY) ×2 IMPLANT
PERCLOSE PROGLIDE 6F (VASCULAR PRODUCTS) ×2
SHEATH PINNACLE 5F 10CM (SHEATH) ×1 IMPLANT
SYR MEDRAD MARK V 150ML (SYRINGE) ×2 IMPLANT
TRANSDUCER W/STOPCOCK (MISCELLANEOUS) ×2 IMPLANT
TUBING CIL FLEX 10 FLL-RA (TUBING) ×2 IMPLANT
WIRE EMERALD 3MM-J .035X150CM (WIRE) ×1 IMPLANT
WIRE SAFE-T 1.5MM-J .035X260CM (WIRE) IMPLANT

## 2015-08-07 NOTE — H&P (View-Only) (Signed)
Chief Complaint  Patient presents with  . Shortness of Breath  . Chest Pain      History of Present Illness: 60 y.o. female with history of asthma, depression, GERD, fibromyalgia and CAD who is here today for cardiac follow up. [place] 04/2008 demonstrated 80% stenosis in the mid RCA treated with a Xience DES. Patient was noted to have possible SVT during cardiac rehabilitation 05/2008. Re-look LHC 05/2008: Proximal RCA 25%, mid RCA stent patent. Holter 03/2009: NSR, PACs and PVCs. Liver biopsy 2011 reportedly normal (steatohepatitis). Stress myoview on  11/21/12 showed a reversible basal inferior perfusion defect. She described class III angina on her f/u in our office 12/02/12. Cardiac cath 12/06/12 with severe stenosis in the mid RCA just before the old stent which also had restenosis. I treated the entire segment with a 2.75 x 28 mm Promus Premier DES. This was post-dilated to 3.0 mm. She had recurrent chest pain and was seen in the office 05/05/13. Cardiac cath 05/08/13 with severe stenosis mid LAD. A 2.5 x 16 mm Promus DES was deployed in the mid LAD. She also had c/o palpitations. She called our office in August 2015 c/o tachycardia, heart racing. Lopressor 12.5 mg po BID started. 24 hour monitor November 2015 with PACs/PVCs and 4 beat run SVT.  Last cath November 2015 with stable CAD.   She is here today for follow up. She describes dyspnea with minimal exertion. There is associated chest pressure. This is similar to prior angina. She has been seen in primary care last week and chest x-ray was normal per pt. No weight gain. She has daily palpitations.   Primary Care Physician: Imelda Pillow, NP  Last Lipid Profile: Followed in primary care.   Past Medical History  Diagnosis Date  . Coronary artery disease     a. s/p Xience DES to [facility] 04/2008;  b. LHC 05/2008: Proximal RCA 25%, mid RCA stent patent;  c. Lexiscan Myoview (9/14):  Low risk, small mild partially reversible basal inferior  perfusion defect, EF 61%, no rwma;  d. 04/2013 Cath/PCI: LM nl, LAD 18m2.25x16 Promus Premier DES), LCX nl, RCA 20p, patent mid stent, PDA 90ost (small), EF 40% sev HK of ant wall to apex.  . Hyperlipidemia     intol to statins and other chol agents due to elevated LFTs  . GERD (gastroesophageal reflux disease)   . Fibromyalgia   . Asthma   . Pneumonia     "couple times; not recently" (12/06/2012)  . Chronic bronchitis ([facility]     "used to get it yearly; not so much since pneu shots" (12/06/2012)  . Shortness of breath     "all the time last month or so" (12/06/2012)  . Anemia     "long time ago" (12/06/2012)  . H/O hiatal hernia   . Hepatitis     "not A, B, or C" (12/06/2012)  . Sinus headache     "alot" (12/06/2012)  . Migraines     "when I was younger" (12/06/2012)  . Arthritis     "right knee real bad; in my hands bad" (12/06/2012)  . Depression     "years ago" (12/06/2012)  . Anginal pain ([place]   . Hypertension     Past Surgical History  Procedure Laterality Date  . Cardiac catheterization  04/2008; 05/2008  . Coronary angioplasty with stent placement  04/2008 12/06/2012    "1 + 1" (12/06/2012)  . Cholecystectomy    . Vaginal hysterectomy    .  Tubal ligation    . Coronary stent placement  05/08/2013    DES TO LAD      DR [facility]  . Left heart catheterization with coronary angiogram N/A 12/06/2012    Procedure: LEFT HEART CATHETERIZATION WITH CORONARY ANGIOGRAM;  Surgeon: [name], MD;  Location: [facility] CATH LAB;  Service: Cardiovascular;  Laterality: N/A;  . Percutaneous coronary stent intervention (pci-s)  12/06/2012    Procedure: PERCUTANEOUS CORONARY STENT INTERVENTION (PCI-S);  Surgeon: [name], MD;  Location: [facility] CATH LAB;  Service: Cardiovascular;;  . Left heart catheterization with coronary angiogram N/A 05/08/2013    Procedure: LEFT HEART CATHETERIZATION WITH CORONARY ANGIOGRAM;  Surgeon: [name], MD;  Location: [facility] CATH LAB;  Service:  Cardiovascular;  Laterality: N/A;  . Percutaneous coronary stent intervention (pci-s)  05/08/2013    Procedure: PERCUTANEOUS CORONARY STENT INTERVENTION (PCI-S);  Surgeon: [name], MD;  Location: [facility] CATH LAB;  Service: Cardiovascular;;  mid LAD   . Left heart catheterization with coronary angiogram N/A 01/04/2014    Procedure: LEFT HEART CATHETERIZATION WITH CORONARY ANGIOGRAM;  Surgeon: [name], MD;  Location: [facility] CATH LAB;  Service: Cardiovascular;  Laterality: N/A;    Current Outpatient Prescriptions  Medication Sig Dispense Refill  . albuterol (PROVENTIL HFA;VENTOLIN HFA) 108 (90 BASE) MCG/ACT inhaler Inhale 2 puffs into the lungs every 6 (six) hours as needed for wheezing.    Marland Kitchen albuterol (PROVENTIL) (5 MG/ML) 0.5% nebulizer solution Take 2.5 mg by nebulization every 6 (six) hours as needed for wheezing.    Marland Kitchen aspirin 81 MG chewable tablet Chew 1 tablet (81 mg total) by mouth daily.    Marland Kitchen buPROPion (WELLBUTRIN SR) 150 MG 12 hr tablet Take 150 mg by mouth 2 (two) times daily.    . clopidogrel (PLAVIX) 75 MG tablet Take 1 tablet (75 mg total) by mouth daily. 30 tablet 11  . DULoxetine (CYMBALTA) 60 MG capsule Take 40 mg by mouth daily.  0  . ergocalciferol (VITAMIN D2) 50000 UNITS capsule Take 50,000 Units by mouth once a week. wednesday    . HYDROcodone-acetaminophen (NORCO) 7.5-325 MG per tablet Take 1 tablet by mouth every 6 (six) hours as needed for pain.    Marland Kitchen LORazepam (ATIVAN) 2 MG tablet Take 2 mg by mouth at bedtime.     . methylPREDNISolone (MEDROL DOSEPAK) 4 MG TBPK tablet Take 4 mg by mouth as directed.  0  . metoprolol tartrate (LOPRESSOR) 25 MG tablet Take 0.5 tablets (12.5 mg total) by mouth 2 (two) times daily. 30 tablet 11  . montelukast (SINGULAIR) 10 MG tablet Take 10 mg by mouth at bedtime as needed (for allergies).     . nitroGLYCERIN (NITROSTAT) 0.4 MG SL tablet Place 1 tablet (0.4 mg total) under the tongue every 5 (five) minutes as needed for  chest pain. 25 tablet 12  . QVAR 80 MCG/ACT inhaler Inhale 1 puff into the lungs at bedtime.   0  . temazepam (RESTORIL) 15 MG capsule Take 15 mg by mouth daily.  0   No current facility-administered medications for this visit.    Allergies  Allergen Reactions  . Statins Other (See Comments)    Liver enzymes  . Tricor [Fenofibrate] Hives and Itching  . Ciprofloxacin Hives  . Codeine Nausea And Vomiting  . Metoprolol Other (See Comments)    Does not tolerate beta blockers 10/02/2013-pt states she can tolerate 25 mg and lower     Social History   Social History  .  Marital Status: Married    Spouse Name: N/A  . Number of Children: N/A  . Years of Education: N/A   Occupational History  . Not on file.   Social History Main Topics  . Smoking status: Former Smoker -- 0.50 packs/day for 30 years    Types: Cigarettes  . Smokeless tobacco: Never Used     Comment: 12/06/2012 "quit smoking cigarettes ~ 8 yr ago"  . Alcohol Use: No  . Drug Use: No  . Sexual Activity: Yes   Other Topics Concern  . Not on file   Social History Narrative    Family History: No CAD  Review of Systems:  As stated in the HPI and otherwise negative.   BP 140/90 mmHg  Pulse 100  Ht 5' 6"  (1.676 m)  Wt 159 lb 1.9 oz (72.176 kg)  BMI 25.69 kg/m2  Physical Examination: General: Well developed, well nourished, NAD HEENT: OP clear, mucus membranes moist SKIN: warm, dry. No rashes. Neuro: No focal deficits Musculoskeletal: Muscle strength 5/5 all ext Psychiatric: Mood and affect normal Neck: No JVD, no carotid bruits, no thyromegaly, no lymphadenopathy. Lungs:Clear bilaterally, no wheezes, rhonci, crackles Cardiovascular: Regular rate and rhythm. No murmurs, gallops or rubs. Abdomen:Soft. Bowel sounds present. Non-tender.  Extremities: No lower extremity edema. Pulses are 2 + in the bilateral DP/PT.  Stress myoview 11/21/12: Stress Procedure: The patient received IV Lexiscan 0.4 mg over  15-seconds. Technetium 7mSestamibi injected at 30-seconds. This patient had sob and felt funny with the Lexiscan injection. Quantitative spect images were obtained after a 45 minute delay.  Stress ECG: No significant change from baseline ECG  QPS  Raw Data Images: Normal; no motion artifact; normal heart/lung ratio.  Stress Images: Small, mild basal inferior perfusion defect.  Rest Images: Small, mild basal inferior perfusion defect. Defect is somewhat less prominent than with stress.  Subtraction (SDS): Primarily fixed small mild basal inferior perfusion defect.  Transient Ischemic Dilatation (Normal <1.22): n/a  Lung/Heart Ratio (Normal <0.45): 0.37  Quantitative Gated Spect Images  QGS EDV: 74 ml  QGS ESV: 29 ml  Impression  Exercise Capacity: Lexiscan with no exercise.  BP Response: Normal blood pressure response.  Clinical Symptoms: Short of breath.  ECG Impression: No significant ST segment change suggestive of ischemia.  Comparison with Prior Nuclear Study: No images to compare  Overall Impression: Low risk stress nuclear study with a small, mild partially reversible basal inferior perfusion defect. Cannot rule out small area of ischemia but may be soft tissue attenuation. .  LV Ejection Fraction: 61%. LV Wall Motion: NL LV Function; NL Wall Motion  Cardiac cath November 2015: Left main: No obstructive disease.  Left Anterior Descending Artery: Large caliber vessel that courses to the apex. The mid vessel has a patent stented segment with no restenosis. The distal vessel has diffuse non-obstructive plaque. The moderate caliber diagonal branch has mild plaque disease.  Circumflex Artery: Large caliber vessel with two small caliber obtuse marginal branches followed by a third moderate caliber, bifurcating obtuse marginal branch. No obstructive disease.  Right Coronary Artery: Large, dominant vessel with 20% proximal stenosis. The mid vessel has a patent stented segment with no  restenosis. The PDA is small in caliber with ostial 60% stenosis (1.75 mm vessel) and too small for PCI.  Left Ventricular Angiogram: LVEF=55-60%  EKG:  EKG is ordered today. The ekg ordered today demonstrates NSR, rate 100 bpm. PVC, PACs  Recent Labs: No results found for requested labs within last 365 days.  Lipid Panel Followed in primary care   Wt Readings from Last 3 Encounters:  08/05/15 159 lb 1.9 oz (72.176 kg)  02/11/15 159 lb (72.122 kg)  02/12/14 162 lb (73.483 kg)     Other studies Reviewed: Additional studies/ records that were reviewed today include: . Review of the above records demonstrates:    Assessment and Plan:   1. CAD: She has dyspnea and chest pain c/w prior angina. She is s/p PCI of the LAD and RCA. Last cath November 2015 with stable CAD. Given her recent symptoms, will plan cardiac cath this week to exclude progression of CAD. I am out of town and will schedule with DR. Cooper. Risks and benefits of cath reviewed with pt. She agrees to proceed. Pre-cath labs today. Continue ASA and Plavix along with beta blocker. She does not tolerate statins. Referral to lipid clinic (see below). Will arrange echo to assess LVEF, exclude valvular disease.   2. Palpitations/PAC/PVC: PACs/PVCs with short run of SVT (4 beats) on cardiac monitor November 2015. PVC and PACs on EKG today. She has rare palpitations. Continue beta blocker.   3. Hyperlipidemia: She does not tolerate statins due to muscle pains/weakness and abnormal LFTs. Will refer to Lipid clinic to discuss PCSK9 inh injections.    Current medicines are reviewed at length with the patient today.  The patient does not have concerns regarding medicines.  The following changes have been made:  no change  Labs/ tests ordered today include:   Orders Placed This Encounter  Procedures  . Basic Metabolic Panel (BMET)  . CBC  . INR/PT  . EKG 12-Lead  . ECHOCARDIOGRAM COMPLETE    Disposition:   FU with me in 1   months  Signed, Lauree Chandler, MD 08/05/2015 2:47 PM    [address], [place], [place]  [address] Phone: [phone]; Fax: [phone]

## 2015-08-07 NOTE — Interval H&P Note (Signed)
Cath Lab Visit (complete for each Cath Lab visit)  Clinical Evaluation Leading to the Procedure:   ACS: No.  Non-ACS:    Anginal Classification: CCS III  Anti-ischemic medical therapy: Minimal Therapy (1 class of medications)  Non-Invasive Test Results: No non-invasive testing performed  Prior CABG: No previous CABG      History and Physical Interval Note:  08/07/2015 1:30 PM  [name]  has presented today for surgery, with the diagnosis of unstable angina  The various methods of treatment have been discussed with the patient and family. After consideration of risks, benefits and other options for treatment, the patient has consented to  Procedure(s): Left Heart Cath and Coronary Angiography (N/A) as a surgical intervention .  The patient's history has been reviewed, patient examined, no change in status, stable for surgery.  I have reviewed the patient's chart and labs.  Questions were answered to the patient's satisfaction.     Sherren Mocha

## 2015-08-07 NOTE — Discharge Instructions (Signed)
Angiogram, Care After Refer to this sheet in the next few weeks. These instructions provide you with information about caring for yourself after your procedure. Your health care provider may also give you more specific instructions. Your treatment has been planned according to current medical practices, but problems sometimes occur. Call your health care provider if you have any problems or questions after your procedure. WHAT TO EXPECT AFTER THE PROCEDURE After your procedure, it is typical to have the following:  Bruising at the catheter insertion site that usually fades within 1-2 weeks.  Blood collecting in the tissue (hematoma) that may be painful to the touch. It should usually decrease in size and tenderness within 1-2 weeks. HOME CARE INSTRUCTIONS  Take medicines only as directed by your health care provider.  You may shower 24-48 hours after the procedure or as directed by your health care provider. Remove the bandage (dressing) and gently wash the site with plain soap and water. Pat the area dry with a clean towel. Do not rub the site, because this may cause bleeding.  Do not take baths, swim, or use a hot tub until your health care provider approves.  Check your insertion site every day for redness, swelling, or drainage.  Do not apply powder or lotion to the site.  Do not lift over 10 lb (4.5 kg) for 5 days after your procedure or as directed by your health care provider.  Ask your health care provider when it is okay to:  Return to work or school.  Resume usual physical activities or sports.  Resume sexual activity.  Do not drive home if you are discharged the same day as the procedure. Have someone else drive you.  You may drive 24 hours after the procedure unless otherwise instructed by your health care provider.  Do not operate machinery or power tools for 24 hours after the procedure or as directed by your health care provider.  If your procedure was done as an  outpatient procedure, which means that you went home the same day as your procedure, a responsible adult should be with you for the first 24 hours after you arrive home.  Keep all follow-up visits as directed by your health care provider. This is important. SEEK MEDICAL CARE IF:  You have a fever.  You have chills.  You have increased bleeding from the catheter insertion site. Hold pressure on the site. CALL 911 SEEK IMMEDIATE MEDICAL CARE IF:  You have unusual pain at the catheter insertion site.  You have redness, warmth, or swelling at the catheter insertion site.  You have drainage (other than a small amount of blood on the dressing) from the catheter insertion site.  The catheter insertion site is bleeding, and the bleeding does not stop after 30 minutes of holding steady pressure on the site.  The area near or just beyond the catheter insertion site becomes pale, cool, tingly, or numb.   This information is not intended to replace advice given to you by your health care provider. Make sure you discuss any questions you have with your health care provider.   Document Released: 09/04/2004 Document Revised: 03/09/2014 Document Reviewed: 07/20/2012 Elsevier Interactive Patient Education [employer].

## 2015-08-07 NOTE — Progress Notes (Signed)
Spoke with Dr Burt Knack, pt has had a bladder sling and is unable to void unless she leans forward. Per Dr Burt Knack she may sit on a bed pan in 1 hour to try to void, if unable, she may have an in and out urinary cath. Pt was informed.

## 2015-08-08 ENCOUNTER — Encounter (HOSPITAL_COMMUNITY): Payer: Self-pay | Admitting: Cardiovascular Disease

## 2015-08-12 ENCOUNTER — Ambulatory Visit: Payer: Managed Care, Other (non HMO) | Admitting: Physician Assistant

## 2015-08-21 ENCOUNTER — Encounter: Payer: Self-pay | Admitting: Physician Assistant

## 2015-08-22 ENCOUNTER — Other Ambulatory Visit: Payer: Self-pay

## 2015-08-22 ENCOUNTER — Ambulatory Visit (INDEPENDENT_AMBULATORY_CARE_PROVIDER_SITE_OTHER): Payer: Managed Care, Other (non HMO) | Admitting: Pharmacist

## 2015-08-22 ENCOUNTER — Ambulatory Visit (HOSPITAL_COMMUNITY): Payer: Managed Care, Other (non HMO) | Attending: Cardiology

## 2015-08-22 DIAGNOSIS — I491 Atrial premature depolarization: Secondary | ICD-10-CM | POA: Diagnosis not present

## 2015-08-22 DIAGNOSIS — R0600 Dyspnea, unspecified: Secondary | ICD-10-CM

## 2015-08-22 DIAGNOSIS — E785 Hyperlipidemia, unspecified: Secondary | ICD-10-CM | POA: Diagnosis not present

## 2015-08-22 DIAGNOSIS — I200 Unstable angina: Secondary | ICD-10-CM

## 2015-08-22 LAB — ECHOCARDIOGRAM COMPLETE
CHL CUP MV DEC (S): 201
CHL CUP TV REG PEAK VELOCITY: 170 cm/s
E/e' ratio: 7.52
EWDT: 201 ms
FS: 30 % (ref 28–44)
IVS/LV PW RATIO, ED: 0.66
LA ID, A-P, ES: 30 mm
LA vol: 29 mL
LADIAMINDEX: 1.7 cm/m2
LAVOLA4C: 18 mL
LAVOLIN: 16.5 mL/m2
LDCA: 3.14 cm2
LEFT ATRIUM END SYS DIAM: 30 mm
LV E/e' medial: 7.52
LV E/e'average: 7.52
LV PW d: 11.5 mm — AB (ref 0.6–1.1)
LVELAT: 9.65 cm/s
LVOT SV: 58 mL
LVOT VTI: 18.4 cm
LVOT peak vel: 92.5 cm/s
LVOTD: 20 mm
MV Peak grad: 2 mmHg
MV pk E vel: 72.6 m/s
MVPKAVEL: 91.8 m/s
TDI e' lateral: 9.65
TDI e' medial: 8.11
TRMAXVEL: 170 cm/s

## 2015-08-22 LAB — HEPATIC FUNCTION PANEL
ALT: 54 U/L — ABNORMAL HIGH (ref 6–29)
AST: 43 U/L — ABNORMAL HIGH (ref 10–35)
Albumin: 3.8 g/dL (ref 3.6–5.1)
Alkaline Phosphatase: 100 U/L (ref 33–130)
BILIRUBIN DIRECT: 0.1 mg/dL (ref <=0.2)
BILIRUBIN INDIRECT: 0.4 mg/dL (ref 0.2–1.2)
BILIRUBIN TOTAL: 0.5 mg/dL (ref 0.2–1.2)
Total Protein: 6.2 g/dL (ref 6.1–8.1)

## 2015-08-22 LAB — LIPID PANEL
CHOL/HDL RATIO: 4.2 ratio (ref <=5.0)
CHOLESTEROL: 262 mg/dL — AB (ref 125–200)
HDL: 62 mg/dL (ref >=46)
LDL CALC: 163 mg/dL — AB (ref <130)
TRIGLYCERIDES: 183 mg/dL — AB (ref <150)
VLDL: 37 mg/dL — AB (ref <30)

## 2015-08-22 LAB — LDL CHOLESTEROL, DIRECT: LDL DIRECT: 186 mg/dL — AB (ref <130)

## 2015-08-22 NOTE — Progress Notes (Addendum)
Patient ID: [name]                 DOB: May 07, 1955                    MRN: [id number]     HPI: [name] is a 60 y.o. female patient referred to lipid clinic by Dr. Angelena Form. PMH is significant for CAD with multiple plaque ruptures, stenting of the RCA and LAD, asthma, depression, GERD, and fibromyalgia. Pt had symptoms of unstable angina and underwent LHC on 08/07/15. LHC showed double vessel CAD with continued patency of stents in the LAD and RCA. Ost RPDA lesion with 50% stenosis. Pt has history of statin intolerance and presents today for further lipid management.  We do not have any record of specific statins that pt has tried or any lipid panel on file since 2009 when LDL was not calculable d/t elevated TG. Patient reports that she has tried multiple statins previously, including Lipitor, Crestor, simvastatin, and pravastatin. She reports elevated LFTs with ALT > 500 and muscle aches (also has fibromyalgia). She also reports that bilirubin has been elevated, and that per Dr. Earlean Shawl, she has had hepatitis. This all needs to be confirmed by Dr. Liliane Channel office, pt needs to sign a record release form before they can send records to [place].  Pt also reports that she had myalgias with Zetia and hives with fenofibrate. She sees [name] with [facility] and reports that records from her old PCP whose name she cannot spell have been transferred there. Called Dr. Benna Dunks' office and had recent OV notes sent over. No mention of statins or intolerances but did receive recent labs.  Current Medications: none Intolerances: Reports intolerance with Crestor, Lipitor, simvastatin, and pravastatin - myalgias and LFT elevations. Fenofibrate - hives and itching. Zetia - myalgias. Risk Factors: CAD s/p stenting of RCA and LAD, unstable angina LDL goal: < 72m/dL  Diet: Pt avoids fried food. Likes baked chicken, fruit, and veggies. Does have a sweet tooth. Eats donuts. Does not drink  alcohol frequently.   Exercise: Fibromyalgia limites ability to exercise.  Family History: Both parents had high cholesterol.  Social History: Does not drink alcohol. Used to smoke 1 PPD for 20 years, quit 15 years ago.   Labs: Has not had a lipid panel drawn  since 2009. At that time, LDL unable to calculate d/t elevated TG of 685. Will check today, pt has only eaten half a donut and this was 3 hours ago. Will check LDL-D. 5/31 faxed from PCP: A1c 6.1, ALT 16, alk phos 84, AST 13, T bili 0.3, albumin 4.1  Past Medical History  Diagnosis Date  . Coronary artery disease     [facility];  b. LHC 05/2008: Proximal RCA 25%, mid RCA stent patent;  c. Lexiscan Myoview (9/14):  Low risk, small mild partially reversible basal inferior perfusion defect, EF 61%, no rwma;  d. 04/2013 Cath/PCI: LM nl, LAD 963m.25x16 Promus Premier DES), LCX nl, RCA 20p, patent mid stent, PDA 90ost (small), EF 40% sev HK of ant wall to apex.  . Hyperlipidemia     intol to statins and other chol agents due to elevated LFTs  . GERD (gastroesophageal reflux disease)   . Fibromyalgia   . Asthma   . Pneumonia     "couple times; not recently" (12/06/2012)  . Chronic bronchitis ([place]    "used to get it yearly; not  so much since pneu shots" (12/06/2012)  . Shortness of breath     "all the time last month or so" (12/06/2012)  . Anemia     "long time ago" (12/06/2012)  . H/O hiatal hernia   . Hepatitis     "not A, B, or C" (12/06/2012)  . Sinus headache     "alot" (12/06/2012)  . Migraines     "when I was younger" (12/06/2012)  . Arthritis     "right knee real bad; in my hands bad" (12/06/2012)  . Depression     "years ago" (12/06/2012)  . Anginal pain ([place])   . Hypertension     Current Outpatient Prescriptions on File Prior to Visit  Medication Sig Dispense Refill  . albuterol (PROVENTIL HFA;VENTOLIN HFA) 108 (90 BASE) MCG/ACT inhaler Inhale 2 puffs into the lungs every 6 (six) hours as needed for  wheezing.    Marland Kitchen albuterol (PROVENTIL) (5 MG/ML) 0.5% nebulizer solution Take 2.5 mg by nebulization every 6 (six) hours as needed for wheezing.    Marland Kitchen aspirin 81 MG chewable tablet Chew 1 tablet (81 mg total) by mouth daily.    Marland Kitchen buPROPion (WELLBUTRIN SR) 150 MG 12 hr tablet Take 150 mg by mouth 2 (two) times daily.    . clopidogrel (PLAVIX) 75 MG tablet Take 1 tablet (75 mg total) by mouth daily. 30 tablet 11  . DULoxetine (CYMBALTA) 60 MG capsule Take 40 mg by mouth daily.  0  . ergocalciferol (VITAMIN D2) 50000 UNITS capsule Take 50,000 Units by mouth once a week. wednesday    . HYDROcodone-acetaminophen (NORCO) 7.5-325 MG per tablet Take 1 tablet by mouth every 6 (six) hours as needed for pain.    Marland Kitchen LORazepam (ATIVAN) 2 MG tablet Take 2 mg by mouth at bedtime.     . methylPREDNISolone (MEDROL DOSEPAK) 4 MG TBPK tablet Take 4 mg by mouth as directed.  0  . metoprolol tartrate (LOPRESSOR) 25 MG tablet Take 0.5 tablets (12.5 mg total) by mouth 2 (two) times daily. 30 tablet 11  . montelukast (SINGULAIR) 10 MG tablet Take 10 mg by mouth at bedtime as needed (for allergies).     . nitroGLYCERIN (NITROSTAT) 0.4 MG SL tablet Place 1 tablet (0.4 mg total) under the tongue every 5 (five) minutes as needed for chest pain. 25 tablet 12  . QVAR 80 MCG/ACT inhaler Inhale 1 puff into the lungs at bedtime.   0  . temazepam (RESTORIL) 15 MG capsule Take 15 mg by mouth daily.  0   No current facility-administered medications on file prior to visit.    Allergies  Allergen Reactions  . Statins Other (See Comments)    Liver enzymes  . Tricor [Fenofibrate] Hives and Itching  . Ciprofloxacin Hives  . Codeine Nausea And Vomiting  . Metoprolol Other (See Comments)    Does not tolerate beta blockers 10/02/2013-pt states she can tolerate 25 mg and lower     Assessment/Plan:  1. Hyperlipidemia - Do not have any records of recent lipid panel or any lipid lowering therapy tried. Attempted to call pt's GI MD Dr.  Earlean Shawl and pt needs to sign record release form, pt is aware and will complete this. Spoke with PCP and they will forward recent OV notes.  Pt will try to obtain details regarding liver disease, LFTs, and specific statins she has previously tried. Checking baseline lipid panel, LDL-D, and LFTs today since pt has only had a small snack today. Will follow up in  clinic next week to discuss lipid panel results and potential options for treatment.   Megan E. Supple, PharmD, [place] [address] [place], [name] [address] Phone: [phone]; Fax: [phone] 08/22/2015 2:22 PM    Addendum: Damaris Schooner with Eustaquio Maize, RN with [facility]. Per their records, pt has tried atorvastatin 60m daily and Zetia 185mwith intolerances (unspecified) to both.

## 2015-08-22 NOTE — Patient Instructions (Signed)
We will check your cholesterol today.  Try to track down any records of your liver enzymes or previous medications and doses that you have taken for your cholesterol. I will call a few of your doctors to get records too.  Follow up next week so we can discuss cholesterol results and potential options.  Call Tamanika Heiney in lipid clinic with any questions [phone].

## 2015-08-26 ENCOUNTER — Ambulatory Visit: Payer: Managed Care, Other (non HMO)

## 2015-09-02 NOTE — Progress Notes (Signed)
  This encounter was created in error - please disregard.    ROS

## 2015-09-04 ENCOUNTER — Encounter: Payer: Managed Care, Other (non HMO) | Admitting: Physician Assistant

## 2015-09-22 NOTE — Progress Notes (Deleted)
Cardiology Office Note    Date:  09/22/2015   ID:  Quandra, Fedorchak 1955-11-24, MRN [id number]  PCP:  Imelda Pillow, NP  Cardiologist:  ***  CC: No chief complaint on file.   History of Present Illness:  [name] is a 60 y.o. female with a history of *** Cardiologist: Dr. Julianne Handler   CC: follow up cath and echo   History of Present Illness:  [name] is a 60 y.o. female with a history of asthma, depression, GERD, SVT, fibromyalgia and CAD s/p PCI to LAD and RCA who is here today for cardiac follow up.   [place] 04/2008 demonstrated 80% stenosis in the mid RCA treated with a Xience DES. Patient was noted to have possible SVT during cardiac rehabilitation 05/2008. Re-look LHC 05/2008: Proximal RCA 25%, mid RCA stent patent. Holter 03/2009: NSR, PACs and PVCs. Liver biopsy 2011 reportedly normal (steatohepatitis). Stress myoview on 11/21/12 showed a reversible basal inferior perfusion defect. She described class III angina on her f/u in our office 12/02/12. Cardiac cath 12/06/12 with severe stenosis in the mid RCA just before the old stent which also had restenosis. Dr. Julianne Handler treated the entire segment with a 2.75 x 28 mm Promus Premier DES. This was post-dilated to 3.0 mm. She had recurrent chest pain and was seen in the office 05/05/13. Cardiac cath 05/08/13 with severe stenosis mid LAD. A 2.5 x 16 mm Promus DES was deployed in the mid LAD. She also had c/o palpitations. She called our office in August 2015 c/o tachycardia, heart racing. Lopressor 12.5 mg po BID started. 24 hour monitor November 2015 with PACs/PVCs and 4 beat run SVT. Cath November 2015 with stable CAD.   She was seen in the office by Dr. Julianne Handler on 08/05/15. She described dyspnea with minimal exertion and associated chest pressure similar to prior angina. She also had daily palpations. She was set up for cath to rule out progression of CAD and echo to exclude valvular disease. She was also referred to the lipid  clinic due to intolerance to statins.   She underwent cath on 08/07/15 which showed double vessel CAD with continued patency of stents in LAD and RCA. 2D ECHO on 08/22/15 was essentially normal with normal LV function and no valvular abnormalities.   She was seen in the lipid clinic by [facility] Pharm D on 08/22/15. A lipid panel was obtained which showed direct LDL 186. AST/ALT mildly elevated 43/54. Plan is to obtain records from PCP and then will bring back to discuss treatment options (possibly PCSK9i).   Today she presents to clinic for follow up.       Past Medical History:  Diagnosis Date  . Anemia    "long time ago" (12/06/2012)  . Anginal pain ([place])   . Arthritis    "right knee real bad; in my hands bad" (12/06/2012)  . Asthma   . Chronic bronchitis ([place])    "used to get it yearly; not so much since pneu shots" (12/06/2012)  . Coronary artery disease    a. s/p Xience DES to [facility] 04/2008;  b. LHC 05/2008: Proximal RCA 25%, mid RCA stent patent;  c. Lexiscan Myoview (9/14):  Low risk, small mild partially reversible basal inferior perfusion defect, EF 61%, no rwma;  d. 04/2013 Cath/PCI: LM nl, LAD 43m2.25x16 Promus Premier DES), LCX nl, RCA 20p, patent mid stent, PDA 90ost (small), EF 40% sev HK of ant wall to apex.  . Depression    "  years ago" (12/06/2012)  . Fibromyalgia   . GERD (gastroesophageal reflux disease)   . H/O hiatal hernia   . Hepatitis    "not A, B, or C" (12/06/2012)  . Hyperlipidemia    intol to statins and other chol agents due to elevated LFTs  . Hypertension   . Migraines    "when I was younger" (12/06/2012)  . Pneumonia    "couple times; not recently" (12/06/2012)  . Shortness of breath    "all the time last month or so" (12/06/2012)  . Sinus headache    "alot" (12/06/2012)    Past Surgical History:  Procedure Laterality Date  . CARDIAC CATHETERIZATION  04/2008; 05/2008  . CARDIAC CATHETERIZATION N/A 08/07/2015   Procedure: Left Heart Cath and Coronary  Angiography;  Surgeon: Sherren Mocha, MD;  Location: [place] [facility];  Service: Cardiovascular;  Laterality: N/A;  . CHOLECYSTECTOMY    . CORONARY ANGIOPLASTY WITH STENT PLACEMENT  04/2008 12/06/2012   "1 + 1" (12/06/2012)  . CORONARY STENT PLACEMENT  05/08/2013   DES TO LAD      DR MCALHANY  . LEFT HEART CATHETERIZATION WITH CORONARY ANGIOGRAM N/A 12/06/2012   Procedure: LEFT HEART CATHETERIZATION WITH CORONARY ANGIOGRAM;  Surgeon: [name], MD;  Location: [facility] CATH LAB;  Service: Cardiovascular;  Laterality: N/A;  . LEFT HEART CATHETERIZATION WITH CORONARY ANGIOGRAM N/A 05/08/2013   Procedure: LEFT HEART CATHETERIZATION WITH CORONARY ANGIOGRAM;  Surgeon: [name], MD;  Location: [facility] CATH LAB;  Service: Cardiovascular;  Laterality: N/A;  . LEFT HEART CATHETERIZATION WITH CORONARY ANGIOGRAM N/A 01/04/2014   Procedure: LEFT HEART CATHETERIZATION WITH CORONARY ANGIOGRAM;  Surgeon: [name], MD;  Location: [facility] CATH LAB;  Service: Cardiovascular;  Laterality: N/A;  . PERCUTANEOUS CORONARY STENT INTERVENTION (PCI-S)  12/06/2012   Procedure: PERCUTANEOUS CORONARY STENT INTERVENTION (PCI-S);  Surgeon: [name], MD;  Location: [facility] CATH LAB;  Service: Cardiovascular;;  . PERCUTANEOUS CORONARY STENT INTERVENTION (PCI-S)  05/08/2013   Procedure: PERCUTANEOUS CORONARY STENT INTERVENTION (PCI-S);  Surgeon: [name], MD;  Location: [facility] CATH LAB;  Service: Cardiovascular;;  mid LAD   . TUBAL LIGATION    . VAGINAL HYSTERECTOMY      Current Medications: Outpatient Medications Prior to Visit  Medication Sig Dispense Refill  . albuterol (PROVENTIL HFA;VENTOLIN HFA) 108 (90 BASE) MCG/ACT inhaler Inhale 2 puffs into the lungs every 6 (six) hours as needed for wheezing.    Marland Kitchen albuterol (PROVENTIL) (5 MG/ML) 0.5% nebulizer solution Take 2.5 mg by nebulization every 6 (six) hours as needed for wheezing.    Marland Kitchen aspirin 81 MG chewable tablet Chew 1 tablet (81 mg  total) by mouth daily.    Marland Kitchen buPROPion (WELLBUTRIN SR) 150 MG 12 hr tablet Take 150 mg by mouth 2 (two) times daily.    . clopidogrel (PLAVIX) 75 MG tablet Take 1 tablet (75 mg total) by mouth daily. 30 tablet 11  . DULoxetine (CYMBALTA) 60 MG capsule Take 40 mg by mouth daily.  0  . ergocalciferol (VITAMIN D2) 50000 UNITS capsule Take 50,000 Units by mouth once a week. wednesday    . HYDROcodone-acetaminophen (NORCO) 7.5-325 MG per tablet Take 1 tablet by mouth every 6 (six) hours as needed for pain.    Marland Kitchen LORazepam (ATIVAN) 2 MG tablet Take 2 mg by mouth at bedtime.     . methylPREDNISolone (MEDROL DOSEPAK) 4 MG TBPK tablet Take 4 mg by mouth as directed.  0  . metoprolol tartrate (LOPRESSOR) 25 MG  tablet Take 0.5 tablets (12.5 mg total) by mouth 2 (two) times daily. 30 tablet 11  . montelukast (SINGULAIR) 10 MG tablet Take 10 mg by mouth at bedtime as needed (for allergies).     . nitroGLYCERIN (NITROSTAT) 0.4 MG SL tablet Place 1 tablet (0.4 mg total) under the tongue every 5 (five) minutes as needed for chest pain. 25 tablet 12  . QVAR 80 MCG/ACT inhaler Inhale 1 puff into the lungs at bedtime.   0  . temazepam (RESTORIL) 15 MG capsule Take 15 mg by mouth daily.  0   No facility-administered medications prior to visit.      Allergies:   Statins; Tricor [fenofibrate]; Ciprofloxacin; Codeine; and Metoprolol   Social History   Social History  . Marital status: Married    Spouse name: N/A  . Number of children: N/A  . Years of education: N/A   Social History Main Topics  . Smoking status: Former Smoker    Packs/day: 0.50    Years: 30.00    Types: Cigarettes  . Smokeless tobacco: Never Used     Comment: 12/06/2012 "quit smoking cigarettes ~ 8 yr ago"  . Alcohol use No  . Drug use: No  . Sexual activity: Yes   Other Topics Concern  . Not on file   Social History Narrative  . No narrative on file     Family History:  The patient's ***family history is not on file.     ROS:     Please see the history of present illness.    ROS All other systems reviewed and are negative.   PHYSICAL EXAM:   VS:  There were no vitals taken for this visit.   GEN: Well nourished, well developed, in no acute distress HEENT: normal Neck: no JVD, carotid bruits, or masses Cardiac: ***RRR; no murmurs, rubs, or gallops,no edema  Respiratory:  clear to auscultation bilaterally, normal work of breathing GI: soft, nontender, nondistended, + BS MS: no deformity or atrophy Skin: warm and dry, no rash Neuro:  Alert and Oriented x 3, Strength and sensation are intact Psych: euthymic mood, full affect  Wt Readings from Last 3 Encounters:  08/07/15 145 lb (65.8 kg)  08/05/15 159 lb 1.9 oz (72.2 kg)  02/11/15 159 lb (72.1 kg)      Studies/Labs Reviewed:   EKG:  EKG is*** ordered today.  The ekg ordered today demonstrates ***  Recent Labs: 08/05/2015: BUN 18; Creat 0.68; Hemoglobin 14.6; Platelets 297; Potassium 4.1; Sodium 141 08/22/2015: ALT 54   Lipid Panel    Component Value Date/Time   CHOL 262 (H) 08/22/2015 1408   TRIG 183 (H) 08/22/2015 1408   HDL 62 08/22/2015 1408   CHOLHDL 4.2 08/22/2015 1408   VLDL 37 (H) 08/22/2015 1408   LDLCALC 163 (H) 08/22/2015 1408   LDLDIRECT 186 (H) 08/22/2015 1408    Additional studies/ records that were reviewed today include:  Stress myoview 11/21/12:  Stress Procedure: The patient received IV Lexiscan 0.4 mg over 15-seconds. Technetium 92mSestamibi injected at 30-seconds. This patient had sob and felt funny with the Lexiscan injection. Quantitative spect images were obtained after a 45 minute delay.   Stress ECG: No significant change from baseline ECG   QPS   Raw Data Images: Normal; no motion artifact; normal heart/lung ratio.   Stress Images: Small, mild basal inferior perfusion defect.   Rest Images: Small, mild basal inferior perfusion defect. Defect is somewhat less prominent than with stress.   Subtraction (  SDS): Primarily  fixed small mild basal inferior perfusion defect.   Transient Ischemic Dilatation (Normal <1.22): n/a   Lung/Heart Ratio (Normal <0.45): 0.37   Quantitative Gated Spect Images   QGS EDV: 74 ml   QGS ESV: 29 ml   Impression   Exercise Capacity: Lexiscan with no exercise.   BP Response: Normal blood pressure response.   Clinical Symptoms: Short of breath.   ECG Impression: No significant ST segment change suggestive of ischemia.   Comparison with Prior Nuclear Study: No images to compare   Overall Impression: Low risk stress nuclear study with a small, mild partially reversible basal inferior perfusion defect. Cannot rule out small area of ischemia but may be soft tissue attenuation. .   LV Ejection Fraction: 61%. LV Wall Motion: NL LV Function; NL Wall Motion   Cardiac cath November 2015:  Left main: No obstructive disease.   Left Anterior Descending Artery: Large caliber vessel that courses to the apex. The mid vessel has a patent stented segment with no restenosis. The distal vessel has diffuse non-obstructive plaque. The moderate caliber diagonal branch has mild plaque disease.   Circumflex Artery: Large caliber vessel with two small caliber obtuse marginal branches followed by a third moderate caliber, bifurcating obtuse marginal branch. No obstructive disease.   Right Coronary Artery: Large, dominant vessel with 20% proximal stenosis. The mid vessel has a patent stented segment with no restenosis. The PDA is small in caliber with ostial 60% stenosis (1.75 mm vessel) and too small for PCI.   Left Ventricular Angiogram: LVEF=55-60%    08/07/15  Procedures   Left Heart Cath and Coronary Angiography    Conclusion   Mid RCA lesion, 20% stenosed. The lesion was previously treated with a stent (unknown type).  Ost RPDA lesion, 50% stenosed.  The left ventricular systolic function is normal.  Prox LAD to Mid LAD lesion, 10% stenosed. The lesion was previously treated  with a stent (unknown type).    1. Double vessel coronary artery disease with continued patency of the stented segments in the LAD and RCA  2. Preserved LV systolic function      2D ECHO: 08/22/2015  LV EF: 60% - 65%  Study Conclusions  - Left ventricle: The cavity size was normal. Wall thickness was normal. Systolic function was normal. The estimated ejection fraction was in the range of 60% to 65%. Wall motion was normal; there were no regional wall motion abnormalities. Doppler   parameters are consistent with abnormal left ventricular   relaxation (grade 1 diastolic dysfunction).  - Aortic valve: There was no stenosis.  - Mitral valve: There was no regurgitation.  - Right ventricle: The cavity size was normal. Systolic function was normal.  - Tricuspid valve: Peak RV-RA gradient (S): 12 mm Hg.  - Pulmonary arteries: PA peak pressure: 15 mm Hg (S).  - Inferior vena cava: The vessel was normal in size. The   respirophasic diameter changes were in the normal range (= 50%), consistent with normal central venous pressure.  Impressions:  - Normal LV size and systolic function, EF 84-53%. Normal RV size and systolic function. No significant valvular abnormalities.     ASSESSMENT & PLAN:    CAD: recent cath with stable 2V CAD with patent stents.   Palpitations/PAC/PVC: PACs/PVCs with short run of SVT (4 beats) on cardiac monitor November 2015. PVC and PACs on EKG today. Continue beta blocker.   Hyperlipidemia: She does not tolerate statins due to muscle pains/weakness and abnormal LFTs.  Being followed in lipid clinic to discuss PCSK9 inh injections.    Medication Adjustments/Labs and Tests Ordered: Current medicines are reviewed at length with the patient today.  Concerns regarding medicines are outlined above.  Medication changes, Labs and Tests ordered today are listed in the Patient Instructions below. There are no Patient Instructions on file for this visit.    Signed, Angelena Form, PA-C  09/22/2015 10:32 AM    [facility] [address], [place], [place]  [address] Phone: [phone]; Fax: [phone]

## 2015-09-25 ENCOUNTER — Ambulatory Visit (INDEPENDENT_AMBULATORY_CARE_PROVIDER_SITE_OTHER): Payer: Managed Care, Other (non HMO) | Admitting: Physician Assistant

## 2015-09-26 ENCOUNTER — Encounter: Payer: Self-pay | Admitting: Physician Assistant

## 2015-09-26 ENCOUNTER — Ambulatory Visit: Payer: Managed Care, Other (non HMO) | Admitting: Internal Medicine

## 2015-10-08 ENCOUNTER — Encounter: Payer: Self-pay | Admitting: Physician Assistant

## 2015-11-27 ENCOUNTER — Telehealth: Payer: Self-pay | Admitting: Pharmacist

## 2015-11-27 NOTE — Telephone Encounter (Signed)
Patient has no showed to multiple appointments and have been unable to contact her since last visit in lipid clinic. [facility] would not release any previous history of statin therapy or LFTs without patient signing a release form. Patient stated she filled one out, the office did not have any record of it, and pt has been lost to follow up since. Have been unable to pursue PCSK9i therapy because of this.

## 2016-02-12 ENCOUNTER — Other Ambulatory Visit: Payer: Self-pay | Admitting: Cardiovascular Disease

## 2016-03-30 ENCOUNTER — Ambulatory Visit: Payer: Managed Care, Other (non HMO) | Admitting: Cardiovascular Disease

## 2016-03-30 NOTE — Progress Notes (Deleted)
No chief complaint on file.     History of Present Illness: 61 y.o. female with history of asthma, depression, GERD, fibromyalgia and CAD who is here today for cardiac follow up. [place] 04/2008 demonstrated 80% stenosis in the mid RCA treated with a Xience DES. Patient was noted to have possible SVT during cardiac rehabilitation 05/2008. Re-look LHC 05/2008: Proximal RCA 25%, mid RCA stent patent. Holter 03/2009: NSR, PACs and PVCs. Liver biopsy 2011 reportedly normal (steatohepatitis). Stress myoview on  11/21/12 showed a reversible basal inferior perfusion defect. She described class III angina on her f/u in our office 12/02/12. Cardiac cath 12/06/12 with severe stenosis in the mid RCA just before the old stent which also had restenosis. I treated the entire segment with a 2.75 x 28 mm Promus Premier DES. She had recurrent chest pain and was seen in the office 05/05/13. Cardiac cath 05/08/13 with severe stenosis mid LAD. A 2.5 x 16 mm Promus DES was deployed in the mid LAD. She also had c/o palpitations. She called our office in August 2015 c/o tachycardia, heart racing. Lopressor 12.5 mg po BID started. 24 hour monitor November 2015 with PACs/PVCs and 4 beat run SVT.  Last cath June 2017 with stable CAD, patent stents in the RCA and LAD with mild restenosis. Referred to lipid clinic to discuss PCSK9 inh therapy but pt did not follow up.   She is here today for follow up. She has no chest pain or dyspnea. She has daily palpitations.   Primary Care Physician: Imelda Pillow, NP   Past Medical History:  Diagnosis Date  . Anemia    "long time ago" (12/06/2012)  . Anginal pain ([place])   . Arthritis    "right knee real bad; in my hands bad" (12/06/2012)  . Asthma   . Chronic bronchitis ([place])    "used to get it yearly; not so much since pneu shots" (12/06/2012)  . Coronary artery disease    a. s/p Xience DES to [facility] 04/2008;  b. LHC 05/2008: Proximal RCA 25%, mid RCA stent patent;  c. Lexiscan Myoview  (9/14):  Low risk, small mild partially reversible basal inferior perfusion defect, EF 61%, no rwma;  d. 04/2013 Cath/PCI: LM nl, LAD 25m2.25x16 Promus Premier DES), LCX nl, RCA 20p, patent mid stent, PDA 90ost (small), EF 40% sev HK of ant wall to apex.  . Depression    "years ago" (12/06/2012)  . Fibromyalgia   . GERD (gastroesophageal reflux disease)   . H/O hiatal hernia   . Hepatitis    "not A, B, or C" (12/06/2012)  . Hyperlipidemia    intol to statins and other chol agents due to elevated LFTs  . Hypertension   . Migraines    "when I was younger" (12/06/2012)  . Pneumonia    "couple times; not recently" (12/06/2012)  . Shortness of breath    "all the time last month or so" (12/06/2012)  . Sinus headache    "alot" (12/06/2012)    Past Surgical History:  Procedure Laterality Date  . CARDIAC CATHETERIZATION  04/2008; 05/2008  . CARDIAC CATHETERIZATION N/A 08/07/2015   Procedure: Left Heart Cath and Coronary Angiography;  Surgeon: MSherren Mocha MD;  [facility];  Service: Cardiovascular;  Laterality: N/A;  . CHOLECYSTECTOMY    . CORONARY ANGIOPLASTY WITH STENT PLACEMENT  04/2008 12/06/2012   "1 + 1" (12/06/2012)  . CORONARY STENT PLACEMENT  05/08/2013   DES TO LAD  DR Angelena Form  . LEFT HEART CATHETERIZATION WITH CORONARY ANGIOGRAM N/A 12/06/2012   Procedure: LEFT HEART CATHETERIZATION WITH CORONARY ANGIOGRAM;  Surgeon: [name], MD;  Location: [facility] CATH LAB;  Service: Cardiovascular;  Laterality: N/A;  . LEFT HEART CATHETERIZATION WITH CORONARY ANGIOGRAM N/A 05/08/2013   Procedure: LEFT HEART CATHETERIZATION WITH CORONARY ANGIOGRAM;  Surgeon: [name], MD;  Location: [facility] CATH LAB;  Service: Cardiovascular;  Laterality: N/A;  . LEFT HEART CATHETERIZATION WITH CORONARY ANGIOGRAM N/A 01/04/2014   Procedure: LEFT HEART CATHETERIZATION WITH CORONARY ANGIOGRAM;  Surgeon: [name], MD;  Location: [facility] CATH LAB;  Service: Cardiovascular;   Laterality: N/A;  . PERCUTANEOUS CORONARY STENT INTERVENTION (PCI-S)  12/06/2012   Procedure: PERCUTANEOUS CORONARY STENT INTERVENTION (PCI-S);  Surgeon: [name], MD;  Location: [facility] CATH LAB;  Service: Cardiovascular;;  . PERCUTANEOUS CORONARY STENT INTERVENTION (PCI-S)  05/08/2013   Procedure: PERCUTANEOUS CORONARY STENT INTERVENTION (PCI-S);  Surgeon: [name], MD;  Location: [facility] CATH LAB;  Service: Cardiovascular;;  mid LAD   . TUBAL LIGATION    . VAGINAL HYSTERECTOMY      Current Outpatient Prescriptions  Medication Sig Dispense Refill  . albuterol (PROVENTIL HFA;VENTOLIN HFA) 108 (90 BASE) MCG/ACT inhaler Inhale 2 puffs into the lungs every 6 (six) hours as needed for wheezing.    Marland Kitchen albuterol (PROVENTIL) (5 MG/ML) 0.5% nebulizer solution Take 2.5 mg by nebulization every 6 (six) hours as needed for wheezing.    Marland Kitchen aspirin 81 MG chewable tablet Chew 1 tablet (81 mg total) by mouth daily.    Marland Kitchen buPROPion (WELLBUTRIN SR) 150 MG 12 hr tablet Take 150 mg by mouth 2 (two) times daily.    . clopidogrel (PLAVIX) 75 MG tablet Take 1 tablet (75 mg total) by mouth daily. 30 tablet 5  . DULoxetine (CYMBALTA) 60 MG capsule Take 40 mg by mouth daily.  0  . ergocalciferol (VITAMIN D2) 50000 UNITS capsule Take 50,000 Units by mouth once a week. wednesday    . HYDROcodone-acetaminophen (NORCO) 7.5-325 MG per tablet Take 1 tablet by mouth every 6 (six) hours as needed for pain.    Marland Kitchen LORazepam (ATIVAN) 2 MG tablet Take 2 mg by mouth at bedtime.     . methylPREDNISolone (MEDROL DOSEPAK) 4 MG TBPK tablet Take 4 mg by mouth as directed.  0  . metoprolol tartrate (LOPRESSOR) 25 MG tablet Take 0.5 tablets (12.5 mg total) by mouth 2 (two) times daily. 30 tablet 5  . montelukast (SINGULAIR) 10 MG tablet Take 10 mg by mouth at bedtime as needed (for allergies).     . nitroGLYCERIN (NITROSTAT) 0.4 MG SL tablet Place 1 tablet (0.4 mg total) under the tongue every 5 (five) minutes as needed for  chest pain. 25 tablet 12  . QVAR 80 MCG/ACT inhaler Inhale 1 puff into the lungs at bedtime.   0  . temazepam (RESTORIL) 15 MG capsule Take 15 mg by mouth daily.  0   No current facility-administered medications for this visit.     Allergies  Allergen Reactions  . Statins Other (See Comments)    Liver enzymes  . Tricor [Fenofibrate] Hives and Itching  . Ciprofloxacin Hives  . Codeine Nausea And Vomiting  . Metoprolol Other (See Comments)    Does not tolerate beta blockers 10/02/2013-pt states she can tolerate 25 mg and lower     Social History   Social History  . Marital status: Married    Spouse name: N/A  . Number of  children: N/A  . Years of education: N/A   Occupational History  . Not on file.   Social History Main Topics  . Smoking status: Former Smoker    Packs/day: 0.50    Years: 30.00    Types: Cigarettes  . Smokeless tobacco: Never Used     Comment: 12/06/2012 "quit smoking cigarettes ~ 8 yr ago"  . Alcohol use No  . Drug use: No  . Sexual activity: Yes   Other Topics Concern  . Not on file   Social History Narrative  . No narrative on file    Family History: No CAD  Review of Systems:  As stated in the HPI and otherwise negative.   There were no vitals taken for this visit.  Physical Examination: General: Well developed, well nourished, NAD  HEENT: OP clear, mucus membranes moist  SKIN: warm, dry. No rashes. Neuro: No focal deficits  Musculoskeletal: Muscle strength 5/5 all ext  Psychiatric: Mood and affect normal  Neck: No JVD, no carotid bruits, no thyromegaly, no lymphadenopathy.  Lungs:Clear bilaterally, no wheezes, rhonci, crackles Cardiovascular: Regular rate and rhythm. No murmurs, gallops or rubs. Abdomen:Soft. Bowel sounds present. Non-tender.  Extremities: No lower extremity edema. Pulses are 2 + in the bilateral DP/PT.  Echo June 2017: Left ventricle: The cavity size was normal. Wall thickness was   normal. Systolic function was  normal. The estimated ejection   fraction was in the range of 60% to 65%. Wall motion was normal;   there were no regional wall motion abnormalities. Doppler   parameters are consistent with abnormal left ventricular   relaxation (grade 1 diastolic dysfunction). - Aortic valve: There was no stenosis. - Mitral valve: There was no regurgitation. - Right ventricle: The cavity size was normal. Systolic function   was normal. - Tricuspid valve: Peak RV-RA gradient (S): 12 mm Hg. - Pulmonary arteries: PA peak pressure: 15 mm Hg (S). - Inferior vena cava: The vessel was normal in size. The   respirophasic diameter changes were in the normal range (= 50%),   consistent with normal central venous pressure.  Impressions:  - Normal LV size and systolic function, EF 00-76%. Normal RV size   and systolic function. No significant valvular abnormalities.  Cardiac cath June 2017: Coronary Findings   Dominance: Right  Left Anterior Descending  Prox LAD to Mid LAD lesion, 10% stenosed. The lesion was previously treated with a stent (unknown type). Minimal restenosis, widely patent  Left Circumflex  The vessel exhibits minimal luminal irregularities.  Right Coronary Artery  Mid RCA lesion, 20% stenosed. The lesion was previously treated with a stent (unknown type).  Right Posterior Descending Artery  Ost RPDA lesion, 50% stenosed.   Coronary Diagrams   Diagnostic Diagram        EKG:  EKG is *** ordered today. The ekg ordered today demonstrates   Recent Labs: 08/05/2015: BUN 18; Creat 0.68; Hemoglobin 14.6; Platelets 297; Potassium 4.1; Sodium 141 08/22/2015: ALT 54   Lipid Panel Followed in primary care   Wt Readings from Last 3 Encounters:  08/07/15 145 lb (65.8 kg)  08/05/15 159 lb 1.9 oz (72.2 kg)  02/11/15 159 lb (72.1 kg)     Other studies Reviewed: Additional studies/ records that were reviewed today include: . Review of the above records demonstrates:    Assessment  and Plan:   1. CAD: Stable by cath June 2017.  Continue ASA and Plavix along with beta blocker. She does not tolerate statins.  2. Palpitations/PAC/PVC: PACs/PVCs with short run of SVT (4 beats) on cardiac monitor November 2015. She has rare palpitations. Continue beta blocker.   3. Hyperlipidemia: She does not tolerate statins due to muscle pains/weakness and abnormal LFTs. Will refer to Lipid clinic to discuss PCSK9 inh injections.    Current medicines are reviewed at length with the patient today.  The patient does not have concerns regarding medicines.  The following changes have been made:  no change  Labs/ tests ordered today include:   No orders of the defined types were placed in this encounter.   Disposition:   FU with me in 1  months  Signed, Lauree Chandler, MD 03/30/2016 7:55 AM    [address], [place], [place]  [address] Phone: [phone]; Fax: [phone]

## 2016-04-01 ENCOUNTER — Ambulatory Visit: Payer: Managed Care, Other (non HMO) | Admitting: Cardiovascular Disease

## 2016-06-10 ENCOUNTER — Ambulatory Visit: Payer: Managed Care, Other (non HMO) | Admitting: Cardiovascular Disease

## 2016-06-10 NOTE — Progress Notes (Deleted)
No chief complaint on file.    History of Present Illness: 61 yo female with history of CAD, asthma, depression, fibromyalgia who is here today for cardiac follow up. First cardiac cath in 2010 with severe mid RCA stenosis treated with a drug eluting stent. Repeat cardiac cath in 2014 with severe stenosis in the mid RCA just before the old stent and within the old stent. This was treated with another drug eluting stent. Repeat cardiac cath in 2015 with severe stenosis in the mid LAD treated with a drug eluting stent. She has been found to have SVT and has had PVCs and PACs on prior cardiac monitors. She called our office in August 2015 c/o tachycardia, heart racing. Lopressor 12.5 mg po BID started. 24 hour monitor November 2015 with PACs/PVCs and 4 beat run SVT.  Last cath June 2017 with stable CAD.   She is here today for follow up. She denies chest pain, dyspnea, palpitations, near syncope or syncope. ***  Primary Care Physician: Imelda Pillow, NP  Past Medical History:  Diagnosis Date  . Anemia    "long time ago" (12/06/2012)  . Anginal pain ([place])   . Arthritis    "right knee real bad; in my hands bad" (12/06/2012)  . Asthma   . Chronic bronchitis ([place])    "used to get it yearly; not so much since pneu shots" (12/06/2012)  . Coronary artery disease    a. s/p Xience DES to [facility] 04/2008;  b. LHC 05/2008: Proximal RCA 25%, mid RCA stent patent;  c. Lexiscan Myoview (9/14):  Low risk, small mild partially reversible basal inferior perfusion defect, EF 61%, no rwma;  d. 04/2013 Cath/PCI: LM nl, LAD 54m2.25x16 Promus Premier DES), LCX nl, RCA 20p, patent mid stent, PDA 90ost (small), EF 40% sev HK of ant wall to apex.  . Depression    "years ago" (12/06/2012)  . Fibromyalgia   . GERD (gastroesophageal reflux disease)   . H/O hiatal hernia   . Hepatitis    "not A, B, or C" (12/06/2012)  . Hyperlipidemia    intol to statins and other chol agents due to elevated LFTs  . Hypertension     . Migraines    "when I was younger" (12/06/2012)  . Pneumonia    "couple times; not recently" (12/06/2012)  . Shortness of breath    "all the time last month or so" (12/06/2012)  . Sinus headache    "alot" (12/06/2012)    Past Surgical History:  Procedure Laterality Date  . CARDIAC CATHETERIZATION  04/2008; 05/2008  . CARDIAC CATHETERIZATION N/A 08/07/2015   Procedure: Left Heart Cath and Coronary Angiography;  Surgeon: MSherren Mocha MD;  [facility];  Service: Cardiovascular;  Laterality: N/A;  . CHOLECYSTECTOMY    . CORONARY ANGIOPLASTY WITH STENT PLACEMENT  04/2008 12/06/2012   "1 + 1" (12/06/2012)  . CORONARY STENT PLACEMENT  05/08/2013   DES TO LAD      DR Montina Dorrance  . LEFT HEART CATHETERIZATION WITH CORONARY ANGIOGRAM N/A 12/06/2012   Procedure: LEFT HEART CATHETERIZATION WITH CORONARY ANGIOGRAM;  Surgeon: CBurnell Blanks MD;  [facility];  Service: Cardiovascular;  Laterality: N/A;  . LEFT HEART CATHETERIZATION WITH CORONARY ANGIOGRAM N/A 05/08/2013   Procedure: LEFT HEART CATHETERIZATION WITH CORONARY ANGIOGRAM;  Surgeon: CBurnell Blanks MD;  [facility];  Service: Cardiovascular;  Laterality: N/A;  . LEFT HEART CATHETERIZATION WITH CORONARY ANGIOGRAM N/A 01/04/2014   Procedure: LEFT HEART  CATHETERIZATION WITH CORONARY ANGIOGRAM;  Surgeon: [name], MD;  Location: [facility] CATH LAB;  Service: Cardiovascular;  Laterality: N/A;  . PERCUTANEOUS CORONARY STENT INTERVENTION (PCI-S)  12/06/2012   Procedure: PERCUTANEOUS CORONARY STENT INTERVENTION (PCI-S);  Surgeon: [name], MD;  Location: [facility] CATH LAB;  Service: Cardiovascular;;  . PERCUTANEOUS CORONARY STENT INTERVENTION (PCI-S)  05/08/2013   Procedure: PERCUTANEOUS CORONARY STENT INTERVENTION (PCI-S);  Surgeon: [name], MD;  Location: [facility] CATH LAB;  Service: Cardiovascular;;  mid LAD   . TUBAL LIGATION    . VAGINAL HYSTERECTOMY      Current Outpatient  Prescriptions  Medication Sig Dispense Refill  . albuterol (PROVENTIL HFA;VENTOLIN HFA) 108 (90 BASE) MCG/ACT inhaler Inhale 2 puffs into the lungs every 6 (six) hours as needed for wheezing.    Marland Kitchen albuterol (PROVENTIL) (5 MG/ML) 0.5% nebulizer solution Take 2.5 mg by nebulization every 6 (six) hours as needed for wheezing.    Marland Kitchen aspirin 81 MG chewable tablet Chew 1 tablet (81 mg total) by mouth daily.    Marland Kitchen buPROPion (WELLBUTRIN SR) 150 MG 12 hr tablet Take 150 mg by mouth 2 (two) times daily.    . clopidogrel (PLAVIX) 75 MG tablet Take 1 tablet (75 mg total) by mouth daily. 30 tablet 5  . DULoxetine (CYMBALTA) 60 MG capsule Take 40 mg by mouth daily.  0  . ergocalciferol (VITAMIN D2) 50000 UNITS capsule Take 50,000 Units by mouth once a week. wednesday    . HYDROcodone-acetaminophen (NORCO) 7.5-325 MG per tablet Take 1 tablet by mouth every 6 (six) hours as needed for pain.    Marland Kitchen LORazepam (ATIVAN) 2 MG tablet Take 2 mg by mouth at bedtime.     . methylPREDNISolone (MEDROL DOSEPAK) 4 MG TBPK tablet Take 4 mg by mouth as directed.  0  . metoprolol tartrate (LOPRESSOR) 25 MG tablet Take 0.5 tablets (12.5 mg total) by mouth 2 (two) times daily. 30 tablet 5  . montelukast (SINGULAIR) 10 MG tablet Take 10 mg by mouth at bedtime as needed (for allergies).     . nitroGLYCERIN (NITROSTAT) 0.4 MG SL tablet Place 1 tablet (0.4 mg total) under the tongue every 5 (five) minutes as needed for chest pain. 25 tablet 12  . QVAR 80 MCG/ACT inhaler Inhale 1 puff into the lungs at bedtime.   0  . temazepam (RESTORIL) 15 MG capsule Take 15 mg by mouth daily.  0   No current facility-administered medications for this visit.     Allergies  Allergen Reactions  . Statins Other (See Comments)    Liver enzymes  . Tricor [Fenofibrate] Hives and Itching  . Ciprofloxacin Hives  . Codeine Nausea And Vomiting  . Metoprolol Other (See Comments)    Does not tolerate beta blockers 10/02/2013-pt states she can tolerate 25 mg  and lower     Social History   Social History  . Marital status: Married    Spouse name: N/A  . Number of children: N/A  . Years of education: N/A   Occupational History  . Not on file.   Social History Main Topics  . Smoking status: Former Smoker    Packs/day: 0.50    Years: 30.00    Types: Cigarettes  . Smokeless tobacco: Never Used     Comment: 12/06/2012 "quit smoking cigarettes ~ 8 yr ago"  . Alcohol use No  . Drug use: No  . Sexual activity: Yes   Other Topics Concern  . Not on file  Social History Narrative  . No narrative on file    Family History: No CAD  Review of Systems:  As stated in the HPI and otherwise negative.   There were no vitals taken for this visit.  Physical Examination:  General: Well developed, well nourished, NAD  HEENT: OP clear, mucus membranes moist  SKIN: warm, dry. No rashes. Neuro: No focal deficits  Musculoskeletal: Muscle strength 5/5 all ext  Psychiatric: Mood and affect normal  Neck: No JVD, no carotid bruits, no thyromegaly, no lymphadenopathy.  Lungs:Clear bilaterally, no wheezes, rhonci, crackles Cardiovascular: Regular rate and rhythm. No murmurs, gallops or rubs. Abdomen:Soft. Bowel sounds present. Non-tender.  Extremities: No lower extremity edema. Pulses are 2 + in the bilateral DP/PT.  Echo June 2017: Left ventricle: The cavity size was normal. Wall thickness was   normal. Systolic function was normal. The estimated ejection   fraction was in the range of 60% to 65%. Wall motion was normal;   there were no regional wall motion abnormalities. Doppler   parameters are consistent with abnormal left ventricular   relaxation (grade 1 diastolic dysfunction). - Aortic valve: There was no stenosis. - Mitral valve: There was no regurgitation. - Right ventricle: The cavity size was normal. Systolic function   was normal. - Tricuspid valve: Peak RV-RA gradient (S): 12 mm Hg. - Pulmonary arteries: PA peak pressure: 15  mm Hg (S). - Inferior vena cava: The vessel was normal in size. The   respirophasic diameter changes were in the normal range (= 50%),   consistent with normal central venous pressure.  Impressions:  - Normal LV size and systolic function, EF 41-66%. Normal RV size   and systolic function. No significant valvular abnormalities.  EKG:  EKG is *** ordered today. The ekg ordered today demonstrates   Recent Labs: 08/05/2015: BUN 18; Creat 0.68; Hemoglobin 14.6; Platelets 297; Potassium 4.1; Sodium 141 08/22/2015: ALT 54   Lipid Panel Followed in primary care   Wt Readings from Last 3 Encounters:  08/07/15 145 lb (65.8 kg)  08/05/15 159 lb 1.9 oz (72.2 kg)  02/11/15 159 lb (72.1 kg)     Other studies Reviewed: Additional studies/ records that were reviewed today include: . Review of the above records demonstrates:    Assessment and Plan:   1. CAD with chronic stable angina: ***She has dyspnea and chest pain c/w prior angina. She is s/p PCI of the LAD and RCA. Last cath June 2017 with stable CAD. Continue ASA and Plavix along with beta blocker. She does not tolerate statins. I referred her to the lipid clinic but she missed all follow up appointments.   2. Palpitations/PAC/PVC: PACs/PVCs with short run of SVT (4 beats) on cardiac monitor November 2015. She has rare palpitations. Continue beta blocker.   3. Hyperlipidemia: She does not tolerate statins due to muscle pains/weakness and abnormal LFTs. She was seen in the lipid clinic but missed all f/u appointments.   Current medicines are reviewed at length with the patient today.  The patient does not have concerns regarding medicines.  The following changes have been made:  no change  Labs/ tests ordered today include:   No orders of the defined types were placed in this encounter.   Disposition:   FU with me in ***  months  Signed, Lauree Chandler, MD 06/10/2016 9:43 AM    [address], [place], [place]  [address] Phone: [phone]; Fax: [phone]

## 2016-06-16 ENCOUNTER — Encounter: Payer: Self-pay | Admitting: Cardiovascular Disease

## 2016-09-04 ENCOUNTER — Other Ambulatory Visit: Payer: Self-pay | Admitting: Cardiovascular Disease

## 2016-10-06 ENCOUNTER — Encounter: Payer: Self-pay | Admitting: Physician Assistant

## 2016-10-06 ENCOUNTER — Ambulatory Visit (INDEPENDENT_AMBULATORY_CARE_PROVIDER_SITE_OTHER): Payer: Managed Care, Other (non HMO) | Admitting: Physician Assistant

## 2016-10-06 ENCOUNTER — Encounter (INDEPENDENT_AMBULATORY_CARE_PROVIDER_SITE_OTHER): Payer: Self-pay

## 2016-10-06 VITALS — BP 146/82 | HR 94 | Ht 67.0 in | Wt 160.8 lb

## 2016-10-06 DIAGNOSIS — R0602 Shortness of breath: Secondary | ICD-10-CM | POA: Diagnosis not present

## 2016-10-06 DIAGNOSIS — I10 Essential (primary) hypertension: Secondary | ICD-10-CM

## 2016-10-06 DIAGNOSIS — E782 Mixed hyperlipidemia: Secondary | ICD-10-CM | POA: Diagnosis not present

## 2016-10-06 DIAGNOSIS — I491 Atrial premature depolarization: Secondary | ICD-10-CM

## 2016-10-06 DIAGNOSIS — R61 Generalized hyperhidrosis: Secondary | ICD-10-CM | POA: Diagnosis not present

## 2016-10-06 DIAGNOSIS — I2510 Atherosclerotic heart disease of native coronary artery without angina pectoris: Secondary | ICD-10-CM | POA: Diagnosis not present

## 2016-10-06 DIAGNOSIS — I493 Ventricular premature depolarization: Secondary | ICD-10-CM | POA: Insufficient documentation

## 2016-10-06 DIAGNOSIS — I471 Supraventricular tachycardia: Secondary | ICD-10-CM | POA: Diagnosis not present

## 2016-10-06 MED ORDER — AMLODIPINE BESYLATE 5 MG PO TABS: 5.0000 mg | ORAL_TABLET | Freq: Every day | ORAL | 3 refills | Status: DC

## 2016-10-06 NOTE — Patient Instructions (Addendum)
Medication Instructions: Your physician has recommended you make the following change in your medication:  -1) START Amlodipine (Norvasc) - Take 1 tablet (5 mg) by mouth daily   Labwork: Your physician recommends that you have lab work today: CMET, CBC, TSH, BNP   Procedures/Testing: Your physician has requested that you have a lexiscan myoview. For further information please visit [email or web address]. Please follow instruction sheet, as given.  Follow-Up: Your physician recommends that you schedule a follow-up appointment 1 week after your lexiscan with Melina Copa, PA-C. Can schedule with any APP if there is no availability.  You have been referred to [facility]    If you need a refill on your cardiac medications before your next appointment, please call your pharmacy.

## 2016-10-06 NOTE — Progress Notes (Signed)
Cardiology Office Note    Date:  10/06/2016  ID:  [name], DOB 1955-06-20, MRN [id number] PCP:  [name], NP  Cardiologist:  Dr. Angelena Form   Chief Complaint: f/u CAD  History of Present Illness:  [name] is a 61 y.o. female with history of CAD s/p multiple prior stents as below, PVCs, PACs, SVT, asthma, depression, HTN, hyperlipidemia, GERD, fibromyalgia, remote anemia, hiatal hernia who presents for overdue follow-up.  To recap, LHC 04/2008 demonstrated 80% stenosis in the mid RCA treated with a Xience DES. Re-look LHC 05/2008: Proximal RCA 25%, mid RCA stent patent.  She was previously noted to have possible SVT during cardiac rehabilitation 05/2008. Holter 03/2009: NSR, PACs and PVCs. Liver biopsy 2011 reportedly normal (steatohepatitis). Stress myoview on 11/21/12 showed a reversible basal inferior perfusion defect prompting cath 12/06/12 showing severe stenosis in the mid RCA just before the old stent which also had restenosis -> entire segment treated with a 2.75 x 28 mm Promus Premier DES. She had recurrent CP in 04/2013 with cath showing severe stenosis mid LAD s/p DES. She called our office in August 2015 c/o tachycardia, heart racing. Lopressor 12.5 mg po BID started. 24 hour monitor November 2015 with PACs/PVCs and 4 beat run SVT.  Last cath in 08/2015 done for chest pain and SOB showed stable CAD with continued patency, 20% mRCA, 50% ostial RPDA, 10% prox-mid LAD, LVEF normal. Due to statin intolerance/elevated LFTs she's also been followed in the lipid clinic in 2017. Last labs in 2017 showed AST 43, ALT 54, LDL 186, trig 183, WBC 13.2, Hgb 14.6, K 4.1, Cr 0.68. She was supposed to f/u with [facility] to discuss further management but canceled this appointment.  She returns for follow-up today. She reports for the last few months she's felt more SOB on exertion as well as easier sweating. She also notices leg pain at night (improved with exertion), but states she has  fibromyalgia so is not a stranger to pain. She's not had any chest pain. The metoprolol has controlled her palpitations well. She's not tolerated any higher doses due to profound fatigue. Denies any bleeding. No weight gain, LEE, orthopnea. Also has asthma followed by PCP, no recent wheezing.   Past Medical History:  Diagnosis Date  . Anemia    "long time ago" (12/06/2012)  . Arthritis    "right knee real bad; in my hands bad" (12/06/2012)  . Asthma   . Coronary artery disease    a. s/p Xience DES to [facility] 04/2008;  b. LHC 05/2008: Proximal RCA 25%, mid RCA stent patent.  c. Anormal nuc 2014 -> s/p LHC with severe mRCA stenosis s/p DES. d. Cath 04/2013 s/p DES to LAD.  e. LHC 08/2015 was stable.  . Depression    "years ago" (12/06/2012)  . Fibromyalgia   . GERD (gastroesophageal reflux disease)   . H/O hiatal hernia   . Hepatitis    "not A, B, or C" (12/06/2012)  . Hyperlipidemia    intol to statins and other chol agents due to elevated LFTs  . Hypertension   . Migraines    "when I was younger" (12/06/2012)  . Premature atrial contractions   . PVC's (premature ventricular contractions)   . Sinus headache   . SVT (supraventricular tachycardia) (HCC)     Past Surgical History:  Procedure Laterality Date  . CARDIAC CATHETERIZATION  04/2008; 05/2008  . CARDIAC CATHETERIZATION N/A 08/07/2015   Procedure: Left Heart Cath and Coronary Angiography;  Surgeon: Sherren Mocha, MD;  Location: [place] CV LAB;  Service: Cardiovascular;  Laterality: N/A;  . CHOLECYSTECTOMY    . CORONARY ANGIOPLASTY WITH STENT PLACEMENT  04/2008 12/06/2012   "1 + 1" (12/06/2012)  . CORONARY STENT PLACEMENT  05/08/2013   DES TO LAD      DR MCALHANY  . LEFT HEART CATHETERIZATION WITH CORONARY ANGIOGRAM N/A 12/06/2012   Procedure: LEFT HEART CATHETERIZATION WITH CORONARY ANGIOGRAM;  Surgeon: [name], MD;  Location: [facility] CATH LAB;  Service: Cardiovascular;  Laterality: N/A;  . LEFT HEART CATHETERIZATION WITH  CORONARY ANGIOGRAM N/A 05/08/2013   Procedure: LEFT HEART CATHETERIZATION WITH CORONARY ANGIOGRAM;  Surgeon: [name], MD;  Location: [facility] CATH LAB;  Service: Cardiovascular;  Laterality: N/A;  . LEFT HEART CATHETERIZATION WITH CORONARY ANGIOGRAM N/A 01/04/2014   Procedure: LEFT HEART CATHETERIZATION WITH CORONARY ANGIOGRAM;  Surgeon: [name], MD;  Location: [facility] CATH LAB;  Service: Cardiovascular;  Laterality: N/A;  . PERCUTANEOUS CORONARY STENT INTERVENTION (PCI-S)  12/06/2012   Procedure: PERCUTANEOUS CORONARY STENT INTERVENTION (PCI-S);  Surgeon: [name], MD;  Location: [facility] CATH LAB;  Service: Cardiovascular;;  . PERCUTANEOUS CORONARY STENT INTERVENTION (PCI-S)  05/08/2013   Procedure: PERCUTANEOUS CORONARY STENT INTERVENTION (PCI-S);  Surgeon: [name], MD;  Location: [facility] CATH LAB;  Service: Cardiovascular;;  mid LAD   . TUBAL LIGATION    . VAGINAL HYSTERECTOMY      Current Medications: Current Meds  Medication Sig  . albuterol (PROVENTIL HFA;VENTOLIN HFA) 108 (90 BASE) MCG/ACT inhaler Inhale 2 puffs into the lungs every 6 (six) hours as needed for wheezing.  Marland Kitchen albuterol (PROVENTIL) (5 MG/ML) 0.5% nebulizer solution Take 2.5 mg by nebulization every 6 (six) hours as needed for wheezing.  Marland Kitchen aspirin 81 MG chewable tablet Chew 1 tablet (81 mg total) by mouth daily.  Marland Kitchen buPROPion (WELLBUTRIN SR) 150 MG 12 hr tablet Take 150 mg by mouth 2 (two) times daily.  . clopidogrel (PLAVIX) 75 MG tablet Take 1 tablet (75 mg total) by mouth daily. MUST SCHEDULE APPOINTMENT FOR FURTHER REFILLS  . DULoxetine (CYMBALTA) 60 MG capsule Take 40 mg by mouth daily.  . ergocalciferol (VITAMIN D2) 50000 UNITS capsule Take 50,000 Units by mouth once a week. wednesday  . HYDROcodone-acetaminophen (NORCO) 7.5-325 MG per tablet Take 1 tablet by mouth every 6 (six) hours as needed for pain.  Marland Kitchen LORazepam (ATIVAN) 2 MG tablet Take 2 mg by mouth at bedtime.   . metoprolol  tartrate (LOPRESSOR) 25 MG tablet Take 0.5 tablets (12.5 mg total) by mouth 2 (two) times daily. MUST SCHEDULE APPOINTMENT FOR FURTHER REFILLS  . montelukast (SINGULAIR) 10 MG tablet Take 10 mg by mouth at bedtime as needed (for allergies).   . nitroGLYCERIN (NITROSTAT) 0.4 MG SL tablet Place 1 tablet (0.4 mg total) under the tongue every 5 (five) minutes as needed for chest pain.  Marland Kitchen QVAR 80 MCG/ACT inhaler Inhale 1 puff into the lungs at bedtime.   . temazepam (RESTORIL) 15 MG capsule Take 15 mg by mouth daily.     Allergies:   Statins; Tricor [fenofibrate]; Ciprofloxacin; Codeine; and Metoprolol   Social History   Social History  . Marital status: Married    Spouse name: N/A  . Number of children: N/A  . Years of education: N/A   Social History Main Topics  . Smoking status: Former Smoker    Packs/day: 0.50    Years: 30.00    Types: Cigarettes  . Smokeless tobacco: Never  Used     Comment: 12/06/2012 "quit smoking cigarettes ~ 8 yr ago"  . Alcohol use No  . Drug use: No  . Sexual activity: Yes   Other Topics Concern  . None   Social History Narrative  . None     Family History:  Family History  Problem Relation Age of Onset  . CAD Neg Hx    ROS:   Please see the history of present illness.  All other systems are reviewed and otherwise negative.    PHYSICAL EXAM:   VS:  BP (!) 146/82   Pulse 94   Ht 5' 7"  (1.702 m)   Wt 160 lb 12.8 oz (72.9 kg)   SpO2 98%   BMI 25.18 kg/m   BMI: Body mass index is 25.18 kg/m. GEN: Well nourished, well developed, in no acute distress  HEENT: normocephalic, atraumatic Neck: no JVD, carotid bruits, or masses Cardiac: RRR; no murmurs, rubs, or gallops, no edema  Respiratory: diffusely diminished, no rales, wheezing or rhonchi, normal work of breathing GI: soft, nontender, nondistended, + BS MS: no deformity or atrophy  Skin: warm and dry, no rash Neuro:  Alert and Oriented x 3, Strength and sensation are intact, follows  commands Psych: euthymic mood, full affect  Wt Readings from Last 3 Encounters:  10/06/16 160 lb 12.8 oz (72.9 kg)  08/07/15 145 lb (65.8 kg)  08/05/15 159 lb 1.9 oz (72.2 kg)      Studies/Labs Reviewed:   EKG:  EKG was ordered today and personally reviewed by me and demonstrates NSR 94bpm no acute St-T changes, TWI avL  Recent Labs: No results found for requested labs within last 8760 hours.   Lipid Panel    Component Value Date/Time   CHOL 262 (H) 08/22/2015 1408   TRIG 183 (H) 08/22/2015 1408   HDL 62 08/22/2015 1408   CHOLHDL 4.2 08/22/2015 1408   VLDL 37 (H) 08/22/2015 1408   LDLCALC 163 (H) 08/22/2015 1408   LDLDIRECT 186 (H) 08/22/2015 1408    Additional studies/ records that were reviewed today include: Summarized above.    ASSESSMENT & PLAN:   1. Shortness of breath - could be multifactorial - differential includes progressive CAD, worsening asthma or related to uncontrolled HTN. She is unable to tolerate increased doses of beta blocker thus will start amlodipine 32m daily. Check labs including lytes, CBC, BNP for CHF as well as TSH given her sweating. HR appears to be chronically on the higher side. Will arrange Lexiscan nuclear stress test. Warning sx/ER precautions reviewed. If above testing is unrevealing, I've asked her to discuss further management of her asthma (+/- CXR) with PCP as this could be contributing.  2. CAD - continue ASA, Plavix, BB. Intolerant of statins in the past, see below. Of note, the patient also reported long-history of leg pain at night sometimes as a constellation of her fibromyalgia symptoms. No evidence of DVT on exam. She has excellent pulses distally and no claudication with walking so doubt significant PAD at this time, but follow for now. 3. PVCs/PACs/SVT - quiescent on metoprolol. 4. Essential HTN - add amlodipine 563mdaily and follow. 5. Hyperlipidemia - will refer back to lipid clinic to discuss further management, including  consideration of PCSK-9 inhibitors. Her uncontrolled LDL certainly puts her at risk for future CV incidents. Will update LFTs.  Disposition: F/u with Dr. McAlhany/care team APP after above testing.   Medication Adjustments/Labs and Tests Ordered: Current medicines are reviewed at length with the  patient today.  Concerns regarding medicines are outlined above. Medication changes, Labs and Tests ordered today are summarized above and listed in the Patient Instructions accessible in Encounters.   Signed, Charlie Pitter, PA-C  10/06/2016 2:27 PM    [facility] [place], [place], [place]  [address] Phone: [phone]; Fax: [phone]

## 2016-10-07 ENCOUNTER — Telehealth (HOSPITAL_COMMUNITY): Payer: Self-pay | Admitting: *Deleted

## 2016-10-07 LAB — CBC WITH DIFFERENTIAL/PLATELET
BASOS ABS: 0.1 10*3/uL (ref 0.0–0.2)
Basos: 1 %
EOS (ABSOLUTE): 0.2 10*3/uL (ref 0.0–0.4)
Eos: 2 %
Hematocrit: 44.6 % (ref 34.0–46.6)
Hemoglobin: 14 g/dL (ref 11.1–15.9)
IMMATURE GRANULOCYTES: 0 %
Immature Grans (Abs): 0 10*3/uL (ref 0.0–0.1)
Lymphocytes Absolute: 2.5 10*3/uL (ref 0.7–3.1)
Lymphs: 24 %
MCH: 28 pg (ref 26.6–33.0)
MCHC: 31.4 g/dL — ABNORMAL LOW (ref 31.5–35.7)
MCV: 89 fL (ref 79–97)
MONOS ABS: 0.9 10*3/uL (ref 0.1–0.9)
Monocytes: 8 %
NEUTROS PCT: 65 %
Neutrophils Absolute: 7.1 10*3/uL — ABNORMAL HIGH (ref 1.4–7.0)
PLATELETS: 263 10*3/uL (ref 150–379)
RBC: 5 x10E6/uL (ref 3.77–5.28)
RDW: 14 % (ref 12.3–15.4)
WBC: 10.8 10*3/uL (ref 3.4–10.8)

## 2016-10-07 LAB — COMPREHENSIVE METABOLIC PANEL
A/G RATIO: 1.8 (ref 1.2–2.2)
ALK PHOS: 104 IU/L (ref 39–117)
ALT: 9 IU/L (ref 0–32)
AST: 16 IU/L (ref 0–40)
Albumin: 4.4 g/dL (ref 3.6–4.8)
BILIRUBIN TOTAL: 0.4 mg/dL (ref 0.0–1.2)
BUN/Creatinine Ratio: 32 — ABNORMAL HIGH (ref 12–28)
BUN: 21 mg/dL (ref 8–27)
CHLORIDE: 102 mmol/L (ref 96–106)
CO2: 27 mmol/L (ref 20–29)
CREATININE: 0.65 mg/dL (ref 0.57–1.00)
Calcium: 9.1 mg/dL (ref 8.7–10.3)
GFR calc Af Amer: 111 mL/min/{1.73_m2} (ref >59)
GFR calc non Af Amer: 96 mL/min/{1.73_m2} (ref >59)
Globulin, Total: 2.5 g/dL (ref 1.5–4.5)
Glucose: 98 mg/dL (ref 65–99)
POTASSIUM: 5.1 mmol/L (ref 3.5–5.2)
SODIUM: 143 mmol/L (ref 134–144)
Total Protein: 6.9 g/dL (ref 6.0–8.5)

## 2016-10-07 LAB — PRO B NATRIURETIC PEPTIDE: NT-Pro BNP: 134 pg/mL (ref 0–287)

## 2016-10-07 LAB — TSH: TSH: 2.39 u[IU]/mL (ref 0.450–4.500)

## 2016-10-07 NOTE — Telephone Encounter (Signed)
Patient given detailed instructions per Myocardial Perfusion Study Information Sheet for the test on 10/09/16 at 7:15. Patient notified to arrive 15 minutes early and that it is imperative to arrive on time for appointment to keep from having the test rescheduled.  If you need to cancel or reschedule your appointment, please call the office within 24 hours of your appointment. . Patient verbalized understanding.[name]

## 2016-10-09 ENCOUNTER — Ambulatory Visit (HOSPITAL_COMMUNITY): Payer: Managed Care, Other (non HMO) | Attending: Cardiovascular Disease

## 2016-10-09 DIAGNOSIS — I10 Essential (primary) hypertension: Secondary | ICD-10-CM | POA: Diagnosis not present

## 2016-10-09 DIAGNOSIS — R0602 Shortness of breath: Secondary | ICD-10-CM | POA: Diagnosis not present

## 2016-10-09 DIAGNOSIS — I2510 Atherosclerotic heart disease of native coronary artery without angina pectoris: Secondary | ICD-10-CM | POA: Diagnosis not present

## 2016-10-09 LAB — MYOCARDIAL PERFUSION IMAGING
CHL CUP NUCLEAR SDS: 4
CHL CUP RESTING HR STRESS: 93 {beats}/min
CSEPPHR: 115 {beats}/min
LV sys vol: 31 mL
LVDIAVOL: 61 mL (ref 46–106)
RATE: 0.25
SRS: 6
SSS: 10
TID: 0.88

## 2016-10-09 MED ORDER — TECHNETIUM TC 99M TETROFOSMIN IV KIT
31.4000 | PACK | Freq: Once | INTRAVENOUS | Status: AC | PRN
Start: 2016-10-09 — End: 2016-10-09
  Administered 2016-10-09: 31.4 via INTRAVENOUS
  Filled 2016-10-09: qty 32

## 2016-10-09 MED ORDER — REGADENOSON 0.4 MG/5ML IV SOLN
0.4000 mg | Freq: Once | INTRAVENOUS | Status: AC
  Administered 2016-10-09: 0.4 mg via INTRAVENOUS

## 2016-10-09 MED ORDER — TECHNETIUM TC 99M TETROFOSMIN IV KIT
10.3000 | PACK | Freq: Once | INTRAVENOUS | Status: AC | PRN
  Administered 2016-10-09: 10.3 via INTRAVENOUS
  Filled 2016-10-09: qty 11

## 2016-10-12 ENCOUNTER — Telehealth: Payer: Self-pay | Admitting: *Deleted

## 2016-10-12 DIAGNOSIS — R9430 Abnormal result of cardiovascular function study, unspecified: Secondary | ICD-10-CM

## 2016-10-12 NOTE — Telephone Encounter (Signed)
Pt has been notified of Myoview results and findings of mildly decreased EF 48%. Pt advised per PA to have a limited echo to confirm EF. Pt agreeable to plan of care. Pt is aware order placed and that [facility] will call to schedule her test. Pt thanked me for my call today.

## 2016-10-12 NOTE — Telephone Encounter (Signed)
-----   Message from Erma Heritage, [place] sent at 10/09/2016  5:11 PM EDT ----- Covering for Dayna - Please let the patient know her stress test showed no evidence of ischemia or prior infarction, overall being a low-risk study. Her ejection fraction (pumping function) of the heart was read as being mildly reduced. Would recommend a limited echo to assess EF as sometimes this as read as being falsely low on stress tests but with her worsening dyspnea, we want to make sure it is normal. Thank you.

## 2016-10-13 ENCOUNTER — Telehealth: Payer: Self-pay | Admitting: Physician Assistant

## 2016-10-13 NOTE — Telephone Encounter (Signed)
Called patient and LVM to call back to scheduled limited echo.

## 2016-10-19 ENCOUNTER — Other Ambulatory Visit: Payer: Self-pay

## 2016-10-19 ENCOUNTER — Ambulatory Visit (HOSPITAL_COMMUNITY): Payer: Managed Care, Other (non HMO) | Attending: Cardiovascular Disease

## 2016-10-19 DIAGNOSIS — R9430 Abnormal result of cardiovascular function study, unspecified: Secondary | ICD-10-CM

## 2016-10-19 DIAGNOSIS — I5030 Unspecified diastolic (congestive) heart failure: Secondary | ICD-10-CM | POA: Diagnosis not present

## 2016-10-23 ENCOUNTER — Ambulatory Visit: Payer: Managed Care, Other (non HMO) | Admitting: Cardiology

## 2016-10-26 ENCOUNTER — Encounter: Payer: Self-pay | Admitting: Cardiology

## 2016-10-28 ENCOUNTER — Telehealth: Payer: Self-pay | Admitting: Physician Assistant

## 2016-10-28 NOTE — Telephone Encounter (Signed)
F/u message  Pt returning RN call. Please call back to discuss

## 2016-10-30 NOTE — Telephone Encounter (Signed)
Follow up     Pt is returning call about results of test.

## 2016-11-03 NOTE — Telephone Encounter (Signed)
Left message to call office

## 2016-11-03 NOTE — Telephone Encounter (Signed)
I spoke with pt and reviewed echo results/recommendations with her. She missed follow up appt on 8/ 24/18 due to sickness.  I scheduled follow up appt on 11/27/16 at 3:30 with B. Rosita Fire, [place]

## 2016-11-23 ENCOUNTER — Other Ambulatory Visit: Payer: Self-pay | Admitting: Cardiovascular Disease

## 2016-11-24 ENCOUNTER — Encounter: Payer: Self-pay | Admitting: Gastroenterology

## 2016-11-27 ENCOUNTER — Encounter: Payer: Self-pay | Admitting: Cardiology

## 2016-11-27 ENCOUNTER — Ambulatory Visit (INDEPENDENT_AMBULATORY_CARE_PROVIDER_SITE_OTHER): Payer: Managed Care, Other (non HMO) | Admitting: Cardiology

## 2016-11-27 ENCOUNTER — Encounter (INDEPENDENT_AMBULATORY_CARE_PROVIDER_SITE_OTHER): Payer: Self-pay

## 2016-11-27 VITALS — BP 110/68 | HR 68 | Ht 67.0 in | Wt 161.0 lb

## 2016-11-27 DIAGNOSIS — I2510 Atherosclerotic heart disease of native coronary artery without angina pectoris: Secondary | ICD-10-CM | POA: Diagnosis not present

## 2016-11-27 NOTE — Progress Notes (Signed)
11/27/2016 Fredrik Cove   1955/11/13  [id number]  Primary Physician [name], NP Primary Cardiologist: Dr. Angelena Form    Reason for Visit/CC: f/u for CAD and dyspnea  HPI:  [name] is a 61 y.o. female  Followed by Dr. Angelena Form, who presents to clinic today for f/u for CAD.   She has had multiple prior stents, history of PVCs, PACs, SVT, asthma, hypertension, hyperlipidemia, GERD and hiatal hernia.  As noted above she has had multiple prior stents to LAD and RCA. Her most recent left heart catheterization was in June 2017 after patient was admitted for symptoms of unstable angina. Catheterization showed double vessel coronary artery disease with continued patency of the stented segments in the LAD and RCA. She was noted to have 50% stenosis ostial right PDA lesion that was treated medically. Left ventricular EF was preserved. Medical therapy recommended.   She was seen recently by Melina Copa, PA-C on 10/06/2016 and complained of exertional dyspnea. Subsequently she was ordered to undergo a nuclear stress to assess for ischemia. This was done 10/09/2016 and showed no evidence of ischemia or prior infarction. EF was noted to be  Mildly decreased at 48%. Given her mildly reduced EF by nuclear study, she was ordered to get a limited echocardiogram to better assess her LV function. This was performed 10/19/2016 and showed normal left ventricular EF at 55-60%. Grade 1 diastolic dysfunction was noted. Wall motion was normal. No significant valvular disease was noted. She also has recent labs given her dyspnea. Pro BNP was normal. CBC negative for anemia. TSH normal. CMP also unremarkable.   Of note, her BP was also elevated at last OV. Amlodipine was added, 5 mg daily. Given her elevated LDL and statin intolerance, she was also referred to lipid clinic for consideration for PCSK9 inhibitors.   Today in clinic, she denies any chest pain. She still has some occasional exertional dyspnea  with moderate activity however she also has asthma and wonders if some of this is related to pulmonary issues. She denies any resting dyspnea. She reports full medication compliance. Blood pressure stable 110/68. She is tolerating amlodipine well. Her pulse rate is well controlled at 68 bpm.    Current Meds  Medication Sig  . albuterol (PROAIR HFA) 108 (90 Base) MCG/ACT inhaler Inhale 1 puff into the lungs 2 (two) times daily.  Marland Kitchen albuterol (PROVENTIL HFA;VENTOLIN HFA) 108 (90 BASE) MCG/ACT inhaler Inhale 2 puffs into the lungs every 6 (six) hours as needed for wheezing.  Marland Kitchen albuterol (PROVENTIL) (5 MG/ML) 0.5% nebulizer solution Take 2.5 mg by nebulization every 6 (six) hours as needed for wheezing.  Marland Kitchen amLODipine (NORVASC) 5 MG tablet Take 1 tablet (5 mg total) by mouth daily.  Marland Kitchen aspirin 81 MG chewable tablet Chew 1 tablet (81 mg total) by mouth daily.  Marland Kitchen buPROPion (WELLBUTRIN SR) 150 MG 12 hr tablet Take 150 mg by mouth 2 (two) times daily.  . clopidogrel (PLAVIX) 75 MG tablet Take 1 tablet (75 mg total) by mouth daily. MUST SCHEDULE APPOINTMENT FOR FURTHER REFILLS  . DULoxetine (CYMBALTA) 60 MG capsule Take 40 mg by mouth daily.  . ergocalciferol (VITAMIN D2) 50000 UNITS capsule Take 50,000 Units by mouth once a week. wednesday  . HYDROcodone-acetaminophen (NORCO) 7.5-325 MG per tablet Take 1 tablet by mouth every 6 (six) hours as needed for pain.  Marland Kitchen LORazepam (ATIVAN) 2 MG tablet Take 2 mg by mouth at bedtime.   . metoprolol tartrate (LOPRESSOR) 25 MG tablet Take  0.5 tablets (12.5 mg total) by mouth 2 (two) times daily.  . montelukast (SINGULAIR) 10 MG tablet Take 10 mg by mouth at bedtime as needed (for allergies).   . nitroGLYCERIN (NITROSTAT) 0.4 MG SL tablet Place 1 tablet (0.4 mg total) under the tongue every 5 (five) minutes as needed for chest pain.  Marland Kitchen QVAR 80 MCG/ACT inhaler Inhale 1 puff into the lungs at bedtime.   . temazepam (RESTORIL) 15 MG capsule Take 15 mg by mouth daily.    Allergies  Allergen Reactions  . Statins Other (See Comments)    Liver enzymes  . Tricor [Fenofibrate] Hives and Itching  . Ciprofloxacin Hives  . Codeine Nausea And Vomiting  . Metoprolol Other (See Comments)    Does not tolerate beta blockers 10/02/2013-pt states she can tolerate 25 mg and lower    Past Medical History:  Diagnosis Date  . Anemia    "long time ago" (12/06/2012)  . Arthritis    "right knee real bad; in my hands bad" (12/06/2012)  . Asthma   . Coronary artery disease    a. s/p Xience DES to [facility] 04/2008;  b. LHC 05/2008: Proximal RCA 25%, mid RCA stent patent.  c. Anormal nuc 2014 -> s/p LHC with severe mRCA stenosis s/p DES. d. Cath 04/2013 s/p DES to LAD.  e. LHC 08/2015 was stable.  . Depression    "years ago" (12/06/2012)  . Fibromyalgia   . GERD (gastroesophageal reflux disease)   . H/O hiatal hernia   . Hepatitis    "not A, B, or C" (12/06/2012)  . Hyperlipidemia    intol to statins and other chol agents due to elevated LFTs  . Hypertension   . Migraines    "when I was younger" (12/06/2012)  . Premature atrial contractions   . PVC's (premature ventricular contractions)   . Sinus headache   . SVT (supraventricular tachycardia) (HCC)    Family History  Problem Relation Age of Onset  . CAD Neg Hx    Past Surgical History:  Procedure Laterality Date  . CARDIAC CATHETERIZATION  04/2008; 05/2008  . CARDIAC CATHETERIZATION N/A 08/07/2015   Procedure: Left Heart Cath and Coronary Angiography;  Surgeon: Sherren Mocha, MD;  Location: [place] CV LAB;  Service: Cardiovascular;  Laterality: N/A;  . CHOLECYSTECTOMY    . CORONARY ANGIOPLASTY WITH STENT PLACEMENT  04/2008 12/06/2012   "1 + 1" (12/06/2012)  . CORONARY STENT PLACEMENT  05/08/2013   DES TO LAD      DR MCALHANY  . LEFT HEART CATHETERIZATION WITH CORONARY ANGIOGRAM N/A 12/06/2012   Procedure: LEFT HEART CATHETERIZATION WITH CORONARY ANGIOGRAM;  Surgeon: [name], MD;  Location: [facility] CATH LAB;   Service: Cardiovascular;  Laterality: N/A;  . LEFT HEART CATHETERIZATION WITH CORONARY ANGIOGRAM N/A 05/08/2013   Procedure: LEFT HEART CATHETERIZATION WITH CORONARY ANGIOGRAM;  Surgeon: [name], MD;  Location: [facility] CATH LAB;  Service: Cardiovascular;  Laterality: N/A;  . LEFT HEART CATHETERIZATION WITH CORONARY ANGIOGRAM N/A 01/04/2014   Procedure: LEFT HEART CATHETERIZATION WITH CORONARY ANGIOGRAM;  Surgeon: [name], MD;  Location: [facility] CATH LAB;  Service: Cardiovascular;  Laterality: N/A;  . PERCUTANEOUS CORONARY STENT INTERVENTION (PCI-S)  12/06/2012   Procedure: PERCUTANEOUS CORONARY STENT INTERVENTION (PCI-S);  Surgeon: [name], MD;  Location: [facility] CATH LAB;  Service: Cardiovascular;;  . PERCUTANEOUS CORONARY STENT INTERVENTION (PCI-S)  05/08/2013   Procedure: PERCUTANEOUS CORONARY STENT INTERVENTION (PCI-S);  Surgeon: [name], MD;  Location: [facility] CATH LAB;  Service: Cardiovascular;;  mid LAD   . TUBAL LIGATION    . VAGINAL HYSTERECTOMY     Social History   Social History  . Marital status: Married    Spouse name: N/A  . Number of children: N/A  . Years of education: N/A   Occupational History  . Not on file.   Social History Main Topics  . Smoking status: Former Smoker    Packs/day: 0.50    Years: 30.00    Types: Cigarettes  . Smokeless tobacco: Never Used     Comment: 12/06/2012 "quit smoking cigarettes ~ 8 yr ago"  . Alcohol use No  . Drug use: No  . Sexual activity: Yes   Other Topics Concern  . Not on file   Social History Narrative  . No narrative on file     Review of Systems: General: negative for chills, fever, night sweats or weight changes.  Cardiovascular: negative for chest pain, dyspnea on exertion, edema, orthopnea, palpitations, paroxysmal nocturnal dyspnea or shortness of breath Dermatological: negative for rash Respiratory: negative for cough or wheezing Urologic: negative for hematuria Abdominal:  negative for nausea, vomiting, diarrhea, bright red blood per rectum, melena, or hematemesis Neurologic: negative for visual changes, syncope, or dizziness All other systems reviewed and are otherwise negative except as noted above.   Physical Exam:  Blood pressure 110/68, pulse 68, height 5' 7"  (1.702 m), weight 161 lb (73 kg), SpO2 98 %.  General appearance: alert, cooperative and no distress Neck: no carotid bruit and no JVD Lungs: clear to auscultation bilaterally Heart: regular rate and rhythm, S1, S2 normal, no murmur, click, rub or gallop Extremities: extremities normal, atraumatic, no cyanosis or edema Pulses: 2+ and symmetric Skin: Skin color, texture, turgor normal. No rashes or lesions Neurologic: Grossly normal  EKG HTN -- personally reviewed   ASSESSMENT AND PLAN:   1. CAD: s/p previous PCI + stenting to LAD and RCA. Last [facility] 08/2015  Showed double vessel coronary artery disease with continued patency of the stented segments in the LAD and RCA. She was noted to have 50% stenosis ostial right PDA lesion that was treated medically. Left ventricular EF was preserved. Medical therapy recommended. Recent stress test 09/2016 showed no ischemia. Echo with normal LVEF and wall motion.   2. HLD: h/o statin intolerance. Most recent lipid panel abnormal with elevated LDL at 163 mg/dL. Pt with significant CAD history and goal LDL is < 70 mg/dL. Refer back to lipid clinic for consideration for PCSK9 inhibitors.   3. HTN: controlled on current regimen. Amlodipine added at last OV. Tolerating well.    Follow-Up w/ Dr. Mosetta Putt Ladoris Gene, [facility] 11/27/2016 3:36 PM

## 2016-11-27 NOTE — Patient Instructions (Signed)
Medication Instructions:   Your physician recommends that you continue on your current medications as directed. Please refer to the Current Medication list given to you today.   If you need a refill on your cardiac medications before your next appointment, please call your pharmacy.  Labwork: NONE ORDERED  TODAY    Testing/Procedures: NONE ORDERED  TODAY    Follow-Up:  WITH LIPID CLINIC IN 1 TO 2 WEEKS    Any Other Special Instructions Will Be Listed Below (If Applicable).

## 2016-12-10 ENCOUNTER — Ambulatory Visit: Payer: Managed Care, Other (non HMO)

## 2017-01-18 ENCOUNTER — Ambulatory Visit: Payer: Managed Care, Other (non HMO) | Admitting: Gastroenterology

## 2017-01-18 ENCOUNTER — Telehealth: Payer: Self-pay | Admitting: Gastroenterology

## 2017-01-18 NOTE — Telephone Encounter (Signed)
noted 

## 2017-02-11 ENCOUNTER — Ambulatory Visit: Payer: Managed Care, Other (non HMO) | Admitting: Cardiovascular Disease

## 2017-03-11 ENCOUNTER — Ambulatory Visit: Payer: Managed Care, Other (non HMO) | Admitting: Cardiovascular Disease

## 2017-03-11 NOTE — Progress Notes (Deleted)
No chief complaint on file.   History of Present Illness: 62 y.o. female with history of SVT, PVCs, PACs, CAD, asthma, depression, GERD and  fibromyalgia who is here today for cardiac follow up. Cardiac cath in 2010 with a severe stenosis in the RCA treated with a drug eluting stent. SVT noted during cardiac rehab in 2010. Cardiac monitor in 2011 with NSR, PACs and PVCs. Liver biopsy 2011 reportedly normal (steatohepatitis). Stress myoview on  11/21/12 showed a reversible basal inferior perfusion defect and she was having chest pain. Cardiac cath 12/06/12 with severe stenosis in the mid RCA just before the old stent which also had restenosis. I treated the entire segment with a 2.75 x 28 mm Promus Premier DES.  She had recurrent chest pain and was seen in the office 05/05/13. Cardiac cath 05/08/13 with severe stenosis mid LAD. A 2.5 x 16 mm Promus DES was deployed in the mid LAD. She also had c/o palpitations. She called our office in August 2015 c/o tachycardia, heart racing. Lopressor 12.5 mg po BID started. 24 hour monitor November 2015 with PACs/PVCs and 4 beat run SVT.  Last cath in June 2017 with patent RCA and LAD stents and moderate stenosis in the right posterior descending artery that was treated medically. Seen in our office in August 2018 with c/o dyspnea and nuclear stress test did not show ischemia. Echo August 2018 with normal LV function. No valve disease noted. Due to statin intolerance, she was referred to the lipid clinic ***.   She is here today for follow up. The patient denies any chest pain, dyspnea, palpitations, lower extremity edema, orthopnea, PND, dizziness, near syncope or syncope.  .  Primary Care Physician: [name], NP  Past Medical History:  Diagnosis Date  . Anemia    "long time ago" (12/06/2012)  . Arthritis    "right knee real bad; in my hands bad" (12/06/2012)  . Asthma   . Coronary artery disease    a. s/p Xience DES to [facility] 04/2008;  b. LHC 05/2008:  Proximal RCA 25%, mid RCA stent patent.  c. Anormal nuc 2014 -> s/p LHC with severe mRCA stenosis s/p DES. d. Cath 04/2013 s/p DES to LAD.  e. LHC 08/2015 was stable.  . Depression    "years ago" (12/06/2012)  . Fibromyalgia   . GERD (gastroesophageal reflux disease)   . H/O hiatal hernia   . Hepatitis    "not A, B, or C" (12/06/2012)  . Hyperlipidemia    intol to statins and other chol agents due to elevated LFTs  . Hypertension   . Migraines    "when I was younger" (12/06/2012)  . Premature atrial contractions   . PVC's (premature ventricular contractions)   . Sinus headache   . SVT (supraventricular tachycardia) (HCC)     Past Surgical History:  Procedure Laterality Date  . CARDIAC CATHETERIZATION  04/2008; 05/2008  . CARDIAC CATHETERIZATION N/A 08/07/2015   Procedure: Left Heart Cath and Coronary Angiography;  Surgeon: Sherren Mocha, MD;  Location: [place] CV LAB;  Service: Cardiovascular;  Laterality: N/A;  . CHOLECYSTECTOMY    . CORONARY ANGIOPLASTY WITH STENT PLACEMENT  04/2008 12/06/2012   "1 + 1" (12/06/2012)  . CORONARY STENT PLACEMENT  05/08/2013   DES TO LAD      DR Venus Ruhe  . LEFT HEART CATHETERIZATION WITH CORONARY ANGIOGRAM N/A 12/06/2012   Procedure: LEFT HEART CATHETERIZATION WITH CORONARY ANGIOGRAM;  Surgeon: [name], MD;  Location: [facility] CATH  LAB;  Service: Cardiovascular;  Laterality: N/A;  . LEFT HEART CATHETERIZATION WITH CORONARY ANGIOGRAM N/A 05/08/2013   Procedure: LEFT HEART CATHETERIZATION WITH CORONARY ANGIOGRAM;  Surgeon: [name], MD;  Location: [facility] CATH LAB;  Service: Cardiovascular;  Laterality: N/A;  . LEFT HEART CATHETERIZATION WITH CORONARY ANGIOGRAM N/A 01/04/2014   Procedure: LEFT HEART CATHETERIZATION WITH CORONARY ANGIOGRAM;  Surgeon: [name], MD;  Location: [facility] CATH LAB;  Service: Cardiovascular;  Laterality: N/A;  . PERCUTANEOUS CORONARY STENT INTERVENTION (PCI-S)  12/06/2012   Procedure: PERCUTANEOUS CORONARY  STENT INTERVENTION (PCI-S);  Surgeon: [name], MD;  Location: [facility] CATH LAB;  Service: Cardiovascular;;  . PERCUTANEOUS CORONARY STENT INTERVENTION (PCI-S)  05/08/2013   Procedure: PERCUTANEOUS CORONARY STENT INTERVENTION (PCI-S);  Surgeon: [name], MD;  Location: [facility] CATH LAB;  Service: Cardiovascular;;  mid LAD   . TUBAL LIGATION    . VAGINAL HYSTERECTOMY      Current Outpatient Medications  Medication Sig Dispense Refill  . albuterol (PROAIR HFA) 108 (90 Base) MCG/ACT inhaler Inhale 1 puff into the lungs 2 (two) times daily.    Marland Kitchen albuterol (PROVENTIL HFA;VENTOLIN HFA) 108 (90 BASE) MCG/ACT inhaler Inhale 2 puffs into the lungs every 6 (six) hours as needed for wheezing.    Marland Kitchen albuterol (PROVENTIL) (5 MG/ML) 0.5% nebulizer solution Take 2.5 mg by nebulization every 6 (six) hours as needed for wheezing.    Marland Kitchen amLODipine (NORVASC) 5 MG tablet Take 1 tablet (5 mg total) by mouth daily. 90 tablet 3  . aspirin 81 MG chewable tablet Chew 1 tablet (81 mg total) by mouth daily.    Marland Kitchen buPROPion (WELLBUTRIN SR) 150 MG 12 hr tablet Take 150 mg by mouth 2 (two) times daily.    . clopidogrel (PLAVIX) 75 MG tablet Take 1 tablet (75 mg total) by mouth daily. MUST SCHEDULE APPOINTMENT FOR FURTHER REFILLS 30 tablet 0  . DULoxetine (CYMBALTA) 60 MG capsule Take 40 mg by mouth daily.  0  . ergocalciferol (VITAMIN D2) 50000 UNITS capsule Take 50,000 Units by mouth once a week. wednesday    . HYDROcodone-acetaminophen (NORCO) 7.5-325 MG per tablet Take 1 tablet by mouth every 6 (six) hours as needed for pain.    Marland Kitchen LORazepam (ATIVAN) 2 MG tablet Take 2 mg by mouth at bedtime.     . metoprolol tartrate (LOPRESSOR) 25 MG tablet Take 0.5 tablets (12.5 mg total) by mouth 2 (two) times daily. 30 tablet 9  . montelukast (SINGULAIR) 10 MG tablet Take 10 mg by mouth at bedtime as needed (for allergies).     . nitroGLYCERIN (NITROSTAT) 0.4 MG SL tablet Place 1 tablet (0.4 mg total) under the tongue  every 5 (five) minutes as needed for chest pain. 25 tablet 12  . QVAR 80 MCG/ACT inhaler Inhale 1 puff into the lungs at bedtime.   0  . temazepam (RESTORIL) 15 MG capsule Take 15 mg by mouth daily.  0   No current facility-administered medications for this visit.     Allergies  Allergen Reactions  . Statins Other (See Comments)    Liver enzymes  . Tricor [Fenofibrate] Hives and Itching  . Ciprofloxacin Hives  . Codeine Nausea And Vomiting  . Metoprolol Other (See Comments)    Does not tolerate beta blockers 10/02/2013-pt states she can tolerate 25 mg and lower     Social History   Socioeconomic History  . Marital status: Married    Spouse name: Not on file  . Number of  children: Not on file  . Years of education: Not on file  . Highest education level: Not on file  Social Needs  . Financial resource strain: Not on file  . Food insecurity - worry: Not on file  . Food insecurity - inability: Not on file  . Transportation needs - medical: Not on file  . Transportation needs - non-medical: Not on file  Occupational History  . Not on file  Tobacco Use  . Smoking status: Former Smoker    Packs/day: 0.50    Years: 30.00    Pack years: 15.00    Types: Cigarettes  . Smokeless tobacco: Never Used  . Tobacco comment: 12/06/2012 "quit smoking cigarettes ~ 8 yr ago"  Substance and Sexual Activity  . Alcohol use: No  . Drug use: No  . Sexual activity: Yes  Other Topics Concern  . Not on file  Social History Narrative  . Not on file    Family History: No CAD  Review of Systems:  As stated in the HPI and otherwise negative.   There were no vitals taken for this visit.  Physical Examination:  General: Well developed, well nourished, NAD  HEENT: OP clear, mucus membranes moist  SKIN: warm, dry. No rashes. Neuro: No focal deficits  Musculoskeletal: Muscle strength 5/5 all ext  Psychiatric: Mood and affect normal  Neck: No JVD, no carotid bruits, no thyromegaly, no  lymphadenopathy.  Lungs:Clear bilaterally, no wheezes, rhonci, crackles Cardiovascular: Regular rate and rhythm. No murmurs, gallops or rubs. Abdomen:Soft. Bowel sounds present. Non-tender.  Extremities: No lower extremity edema. Pulses are 2 + in the bilateral DP/PT.  Cardiac cath June 2017: [place] lesion, 20% stenosed. The lesion was previously treated with a stent (unknown type).  Ost RPDA lesion, 50% stenosed.  The left ventricular systolic function is normal.  Prox LAD to Mid LAD lesion, 10% stenosed. The lesion was previously treated with a stent (unknown type).   1. Double vessel coronary artery disease with continued patency of the stented segments in the LAD and RCA 2. Preserved LV systolic function    Coronary Findings   Diagnostic  Dominance: Right  Left Anterior Descending  Prox LAD to Mid LAD lesion 10% stenosed  The lesion was previously treated with a stent (unknown type). Minimal restenosis, widely patent  Left Circumflex  The vessel exhibits minimal luminal irregularities.  Right Coronary Artery  Mid RCA lesion 20% stenosed  The lesion was previously treated with a stent (unknown type).  Right Posterior Descending Artery  Ost RPDA lesion 50% stenosed  Ost RPDA lesion.  Intervention   No interventions have been documented.  Wall Motion              Left Heart   Left Ventricle The left ventricular size is normal. The left ventricular systolic function is normal. The left ventricular ejection fraction is 55-65% by visual estimate. Normal LV function, LVEF 55-60%  Coronary Diagrams   Diagnostic Diagram       Implants     Hemo Data    Most Recent Value  AO Systolic Pressure 784 mmHg  AO Diastolic Pressure 70 mmHg  AO Mean 86 mmHg  LV Systolic Pressure 696 mmHg  LV Diastolic Pressure 2 mmHg  LV EDP 10 mmHg  Arterial Occlusion Pressure Extended Systolic Pressure 295 mmHg  Arterial Occlusion Pressure  Extended Diastolic Pressure 66 mmHg  Arterial Occlusion Pressure Extended Mean Pressure 88 mmHg  Left Ventricular  Apex Extended Systolic Pressure 937 mmHg  Left Ventricular Apex Extended Diastolic Pressure 0 mmHg  Left Ventricular Apex Extended EDP Pressure 12 mmHg   EKG:  EKG is *** ordered today. The ekg ordered today demonstrates  Recent Labs: 10/06/2016: ALT 9; BUN 21; Creatinine, Ser 0.65; Hemoglobin 14.0; NT-Pro BNP 134; Platelets 263; Potassium 5.1; Sodium 143; TSH 2.390   Lipid Panel Followed in primary care   Wt Readings from Last 3 Encounters:  11/27/16 161 lb (73 kg)  10/06/16 160 lb 12.8 oz (72.9 kg)  08/07/15 145 lb (65.8 kg)     Other studies Reviewed: Additional studies/ records that were reviewed today include: . Review of the above records demonstrates:    Assessment and Plan:   1. CAD with chonic stable angina: CAD stable by cath in June 2017. Nuclear stress test Augst 2018 with no ischemia. Will continue ASA, Plavix, Norvasc and beta blocker.   2. Palpitations/PAC/PVC/SVT: Rare palpitations. Continue beta blocker.   3. Hyperlipidemia: She does not tolerate statins due to muscle pains/weakness and abnormal LFTs. She has been referred to the lipid clinic in the past and has missed multiple appointments there. ***    Current medicines are reviewed at length with the patient today.  The patient does not have concerns regarding medicines.  The following changes have been made:  no change  Labs/ tests ordered today include:   No orders of the defined types were placed in this encounter.   Disposition:   FU with me in 1  months  Signed, Lauree Chandler, MD 03/11/2017 6:11 AM    [address], [place], [place]  [address] Phone: [phone]; Fax: [phone]

## 2017-03-15 ENCOUNTER — Encounter: Payer: Self-pay | Admitting: Cardiovascular Disease

## 2017-03-16 ENCOUNTER — Ambulatory Visit: Payer: Managed Care, Other (non HMO) | Admitting: Gastroenterology

## 2017-10-05 ENCOUNTER — Other Ambulatory Visit: Payer: Self-pay | Admitting: Physician Assistant

## 2018-01-10 ENCOUNTER — Encounter: Payer: Self-pay | Admitting: Cardiology

## 2018-01-10 ENCOUNTER — Ambulatory Visit: Payer: Managed Care, Other (non HMO) | Admitting: Cardiology

## 2018-01-10 ENCOUNTER — Encounter (INDEPENDENT_AMBULATORY_CARE_PROVIDER_SITE_OTHER): Payer: Self-pay

## 2018-01-10 VITALS — BP 140/72 | HR 112 | Ht 67.0 in | Wt 162.8 lb

## 2018-01-10 DIAGNOSIS — R0609 Other forms of dyspnea: Secondary | ICD-10-CM | POA: Diagnosis not present

## 2018-01-10 DIAGNOSIS — I2510 Atherosclerotic heart disease of native coronary artery without angina pectoris: Secondary | ICD-10-CM | POA: Diagnosis not present

## 2018-01-10 DIAGNOSIS — R5383 Other fatigue: Secondary | ICD-10-CM

## 2018-01-10 DIAGNOSIS — R000 Tachycardia, unspecified: Secondary | ICD-10-CM

## 2018-01-10 DIAGNOSIS — E785 Hyperlipidemia, unspecified: Secondary | ICD-10-CM | POA: Diagnosis not present

## 2018-01-10 DIAGNOSIS — Z789 Other specified health status: Secondary | ICD-10-CM

## 2018-01-10 DIAGNOSIS — R002 Palpitations: Secondary | ICD-10-CM

## 2018-01-10 LAB — CBC
HEMATOCRIT: 44.1 % (ref 34.0–46.6)
Hemoglobin: 15.1 g/dL (ref 11.1–15.9)
MCH: 29.3 pg (ref 26.6–33.0)
MCHC: 34.2 g/dL (ref 31.5–35.7)
MCV: 86 fL (ref 79–97)
PLATELETS: 310 10*3/uL (ref 150–450)
RBC: 5.16 x10E6/uL (ref 3.77–5.28)
RDW: 15.1 % (ref 12.3–15.4)
WBC: 11.3 10*3/uL — AB (ref 3.4–10.8)

## 2018-01-10 LAB — BASIC METABOLIC PANEL
BUN / CREAT RATIO: 20 (ref 12–28)
BUN: 14 mg/dL (ref 8–27)
CO2: 30 mmol/L — ABNORMAL HIGH (ref 20–29)
CREATININE: 0.7 mg/dL (ref 0.57–1.00)
Calcium: 9.1 mg/dL (ref 8.7–10.3)
Chloride: 104 mmol/L (ref 96–106)
GFR calc Af Amer: 107 mL/min/{1.73_m2} (ref >59)
GFR, EST NON AFRICAN AMERICAN: 93 mL/min/{1.73_m2} (ref >59)
GLUCOSE: 90 mg/dL (ref 65–99)
Potassium: 3.9 mmol/L (ref 3.5–5.2)
SODIUM: 143 mmol/L (ref 134–144)

## 2018-01-10 LAB — D-DIMER, QUANTITATIVE: D-DIMER: 1.45 mg/L FEU — ABNORMAL HIGH (ref 0.00–0.49)

## 2018-01-10 MED ORDER — AMLODIPINE BESYLATE 5 MG PO TABS: 7.5000 mg | ORAL_TABLET | Freq: Every day | ORAL | 3 refills | Status: DC

## 2018-01-10 NOTE — Progress Notes (Signed)
01/10/2018 [name]   [birth year]  [id number]  Primary Physician [name], NP Primary Cardiologist: Dr. Angelena Form Electrophysiologist: N/A  Reason for Visit/CC: 1 yr f/u for CAD  HPI:  [name] is a 62 y.o. female  Followed by Dr. Angelena Form, who presents to clinic today primarily for f/u for CAD.  She has had multiple prior stents, history of PVCs, PACs, SVT, asthma, hypertension, hyperlipidemia, GERD and hiatal hernia.  As noted above she has had multiple prior stents to LAD and RCA. Her most recent left heart catheterization was in June 2017 after patient was admitted for symptoms of unstable angina. Catheterization showed double vessel coronary artery disease with continued patency of the stented segments in the LAD and RCA. She was noted to have 50% stenosis ostial right PDA lesion that was treated medically. Left ventricular EF was preserved. Medical therapy recommended.   She was seen by Melina Copa, PA-C on 10/06/2016 and complained of exertional dyspnea. Subsequently she was ordered to undergo a nuclear stress to assess for ischemia. This was done 10/09/2016 and showed no evidence of ischemia or prior infarction. EF was noted to be mildly decreased at 48%. Given her mildly reduced EF by nuclear study, she was ordered to get a limited echocardiogram to better assess her LV function. This was performed 10/19/2016 and showed normal left ventricular EF at 55-60%. Grade 1 diastolic dysfunction was noted. Wall motion was normal. No significant valvular disease was noted. She also ordered to get labs given her dyspnea. Pro BNP was normal. CBC negative for anemia.  She is here today for her yearly f/u. She denies CP but complains of exertional dyspnea and palpitations. She notes experiencing dyspnea whenever there has been an issue with her heart. EKG today shows sinus tach 112 bpm. She denies LEE/ pain. No recent prolonged travel. She reports full med compliance. She is on  low dose metoprolol 12.5 mg BID. She reports that she has been unable to tolerate higher doses of BB due to fatigue. She tries to stay well hydrated. Denies excess caffeine. No ETOH. No tobacco. She denies abnormal bleeding. No melena or hematochezia. BP today is 140/72.   Current Meds  Medication Sig  . albuterol (PROAIR HFA) 108 (90 Base) MCG/ACT inhaler Inhale 1 puff into the lungs 2 (two) times daily.  Marland Kitchen albuterol (PROVENTIL HFA;VENTOLIN HFA) 108 (90 BASE) MCG/ACT inhaler Inhale 2 puffs into the lungs every 6 (six) hours as needed for wheezing.  Marland Kitchen albuterol (PROVENTIL) (5 MG/ML) 0.5% nebulizer solution Take 2.5 mg by nebulization every 6 (six) hours as needed for wheezing.  Marland Kitchen amLODipine (NORVASC) 5 MG tablet Take 1 tablet (5 mg total) by mouth daily. Patient needs to call and schedule an appointment for further refills 1st attempt  . aspirin 81 MG chewable tablet Chew 1 tablet (81 mg total) by mouth daily.  Marland Kitchen buPROPion (WELLBUTRIN SR) 150 MG 12 hr tablet Take 150 mg by mouth 2 (two) times daily.  . clopidogrel (PLAVIX) 75 MG tablet Take 1 tablet (75 mg total) by mouth daily. MUST SCHEDULE APPOINTMENT FOR FURTHER REFILLS  . DULoxetine (CYMBALTA) 60 MG capsule Take 40 mg by mouth daily.  . ergocalciferol (VITAMIN D2) 50000 UNITS capsule Take 50,000 Units by mouth once a week. wednesday  . HYDROcodone-acetaminophen (NORCO) 7.5-325 MG per tablet Take 1 tablet by mouth every 6 (six) hours as needed for pain.  Marland Kitchen LORazepam (ATIVAN) 2 MG tablet Take 2 mg by mouth at bedtime.   Marland Kitchen  metoprolol tartrate (LOPRESSOR) 25 MG tablet Take 0.5 tablets (12.5 mg total) by mouth 2 (two) times daily.  . montelukast (SINGULAIR) 10 MG tablet Take 10 mg by mouth at bedtime as needed (for allergies).   . nitroGLYCERIN (NITROSTAT) 0.4 MG SL tablet Place 1 tablet (0.4 mg total) under the tongue every 5 (five) minutes as needed for chest pain.  Marland Kitchen QVAR 80 MCG/ACT inhaler Inhale 1 puff into the lungs at bedtime.   . temazepam  (RESTORIL) 15 MG capsule Take 15 mg by mouth daily.   Allergies  Allergen Reactions  . Statins Other (See Comments)    Liver enzymes  . Tricor [Fenofibrate] Hives and Itching  . Ciprofloxacin Hives  . Codeine Nausea And Vomiting  . Metoprolol Other (See Comments)    Does not tolerate beta blockers 10/02/2013-pt states she can tolerate 25 mg and lower    Past Medical History:  Diagnosis Date  . Anemia    "long time ago" (12/06/2012)  . Arthritis    "right knee real bad; in my hands bad" (12/06/2012)  . Asthma   . Coronary artery disease    a. s/p Xience DES to [facility] 04/2008;  b. LHC 05/2008: Proximal RCA 25%, mid RCA stent patent.  c. Anormal nuc 2014 -> s/p LHC with severe mRCA stenosis s/p DES. d. Cath 04/2013 s/p DES to LAD.  e. LHC 08/2015 was stable.  . Depression    "years ago" (12/06/2012)  . Fibromyalgia   . GERD (gastroesophageal reflux disease)   . H/O hiatal hernia   . Hepatitis    "not A, B, or C" (12/06/2012)  . Hyperlipidemia    intol to statins and other chol agents due to elevated LFTs  . Hypertension   . Migraines    "when I was younger" (12/06/2012)  . Premature atrial contractions   . PVC's (premature ventricular contractions)   . Sinus headache   . SVT (supraventricular tachycardia) (HCC)    Family History  Problem Relation Age of Onset  . CAD Neg Hx    Past Surgical History:  Procedure Laterality Date  . CARDIAC CATHETERIZATION  04/2008; 05/2008  . CARDIAC CATHETERIZATION N/A 08/07/2015   Procedure: Left Heart Cath and Coronary Angiography;  Surgeon: Sherren Mocha, MD;  Location: [place] CV LAB;  Service: Cardiovascular;  Laterality: N/A;  . CHOLECYSTECTOMY    . CORONARY ANGIOPLASTY WITH STENT PLACEMENT  04/2008 12/06/2012   "1 + 1" (12/06/2012)  . CORONARY STENT PLACEMENT  05/08/2013   DES TO LAD      DR MCALHANY  . LEFT HEART CATHETERIZATION WITH CORONARY ANGIOGRAM N/A 12/06/2012   Procedure: LEFT HEART CATHETERIZATION WITH CORONARY ANGIOGRAM;  Surgeon:  [name], MD;  Location: [facility] CATH LAB;  Service: Cardiovascular;  Laterality: N/A;  . LEFT HEART CATHETERIZATION WITH CORONARY ANGIOGRAM N/A 05/08/2013   Procedure: LEFT HEART CATHETERIZATION WITH CORONARY ANGIOGRAM;  Surgeon: [name], MD;  Location: [facility] CATH LAB;  Service: Cardiovascular;  Laterality: N/A;  . LEFT HEART CATHETERIZATION WITH CORONARY ANGIOGRAM N/A 01/04/2014   Procedure: LEFT HEART CATHETERIZATION WITH CORONARY ANGIOGRAM;  Surgeon: [name], MD;  Location[facility] CATH LAB;  Service: Cardiovascular;  Laterality: N/A;  . PERCUTANEOUS CORONARY STENT INTERVENTION (PCI-S)  12/06/2012   Procedure: PERCUTANEOUS CORONARY STENT INTERVENTION (PCI-S);  Surgeon: [name], MD;  Location: [facility] CATH LAB;  Service: Cardiovascular;;  . PERCUTANEOUS CORONARY STENT INTERVENTION (PCI-S)  05/08/2013   Procedure: PERCUTANEOUS CORONARY STENT INTERVENTION (PCI-S);  Surgeon: Annita Brod  Angelena Form, MD;  Location: [place] CATH LAB;  Service: Cardiovascular;;  mid LAD   . TUBAL LIGATION    . VAGINAL HYSTERECTOMY     Social History   Socioeconomic History  . Marital status: Married    Spouse name: Not on file  . Number of children: Not on file  . Years of education: Not on file  . Highest education level: Not on file  Occupational History  . Not on file  Social Needs  . Financial resource strain: Not on file  . Food insecurity:    Worry: Not on file    Inability: Not on file  . Transportation needs:    Medical: Not on file    Non-medical: Not on file  Tobacco Use  . Smoking status: Former Smoker    Packs/day: 0.50    Years: 30.00    Pack years: 15.00    Types: Cigarettes  . Smokeless tobacco: Never Used  . Tobacco comment: 12/06/2012 "quit smoking cigarettes ~ 8 yr ago"  Substance and Sexual Activity  . Alcohol use: No  . Drug use: No  . Sexual activity: Yes  Lifestyle  . Physical activity:    Days per week: Not on file    Minutes per session:  Not on file  . Stress: Not on file  Relationships  . Social connections:    Talks on phone: Not on file    Gets together: Not on file    Attends religious service: Not on file    Active member of club or organization: Not on file    Attends meetings of clubs or organizations: Not on file    Relationship status: Not on file  . Intimate partner violence:    Fear of current or ex partner: Not on file    Emotionally abused: Not on file    Physically abused: Not on file    Forced sexual activity: Not on file  Other Topics Concern  . Not on file  Social History Narrative  . Not on file     Review of Systems: General: negative for chills, fever, night sweats or weight changes.  Cardiovascular: negative for chest pain, dyspnea on exertion, edema, orthopnea, palpitations, paroxysmal nocturnal dyspnea or shortness of breath Dermatological: negative for rash Respiratory: negative for cough or wheezing Urologic: negative for hematuria Abdominal: negative for nausea, vomiting, diarrhea, bright red blood per rectum, melena, or hematemesis Neurologic: negative for visual changes, syncope, or dizziness All other systems reviewed and are otherwise negative except as noted above.   Physical Exam:  Height 5' 7"  (1.702 m).  General appearance: alert, cooperative and no distress Neck: no carotid bruit and no JVD Lungs: clear to auscultation bilaterally Heart: regular rate and rhythm, S1, S2 normal, no murmur, click, rub or gallop Extremities: extremities normal, atraumatic, no cyanosis or edema Pulses: 2+ and symmetric Skin: Skin color, texture, turgor normal. No rashes or lesions Neurologic: Grossly normal  EKG sinus tach 112 bpm -- personally reviewed   ASSESSMENT AND PLAN:   1. CAD: s/p previous PCI + stenting to LAD and RCA. Last [facility] 08/2015  Showed double vessel coronary artery disease with continued patency of the stented segments in the LAD and RCA. She was noted to have 50% stenosis  ostial right PDA lesion that was treated medically. Left ventricular EF was preserved. Medical therapy recommended. She had a stress test 09/2016 that showed no ischemia. Echo with normal LVEF and wall motion. She denies CP but notes exertional dyspnea and  fatigue. Will order NST to r/o ischemia. Continue medical therapy w/ ASA, Plavix and BB. No statin due to intolerance.   2. HLD: h/o statin intolerance and uncontrolled LDL.  Pt with significant CAD history and goal LDL is < 70 mg/dL. It was previously recommended for pt to go to Lipid clinic for consideration for PCSK9 inhibitors, however I do not see that this visit ever took place. The last lipid panel we have on file from 2017 showed an LDL of 163 mg/dL. We will place new referral for lipid clinic.   3. HTN: Mildly elevated.  I would prefer to up titrate her metoprolol but she reports intolerance w/ 25 mg dose in the past (severe fatigue). We will increase amlodipine to 7.5 mg daily. Repeat BP check at f/u visit in 3-4 weeks.   4. Dyspnea:  Will order NST to r/o ischemia. 2 week monitor to r/o atria arrthymias given her sinus tach and palpitations. TSH. D-dimer (sinus tach + exertional dyspnea). CBC to r/o anemia given DAPT and fatigue.  BNP.    Follow-Up in 3-4 weeks  [name], MHS [facility] 01/10/2018 3:25 PM

## 2018-01-10 NOTE — Patient Instructions (Signed)
Medication Instructions:  Your physician has recommended you make the following change in your medication:  1. INCREASE AMLODIPINE 5 MG TO 7.5 MG DAILY.  If you need a refill on your cardiac medications before your next appointment, please call your pharmacy.   Lab work: TODAY: BMET, CBC, TSH, D DIMER, BNP If you have labs (blood work) drawn today and your tests are completely normal, you will receive your results only by: Marland Kitchen MyChart Message (if you have MyChart) OR . A paper copy in the mail If you have any lab test that is abnormal or we need to change your treatment, we will call you to review the results.  Testing/Procedures: Your physician has requested that you have a lexiscan myoview. For further information please visit [email or web address]. Please follow instruction sheet, as given.  Your physician has recommended that you wear a 2 WEEK [place]. Cardiac monitors are medical devices that record the heart's electrical activity. Doctors most often use these monitors to diagnose arrhythmias. Arrhythmias are problems with the speed or rhythm of the heartbeat. The monitor is a small, portable device. You can wear one while you do your normal daily activities. This is usually used to diagnose what is causing palpitations/syncope (passing out).  Follow-Up: At [facility], you and your health needs are our priority.  As part of our continuing mission to provide you with exceptional heart care, we have created designated Provider Care Teams.  These Care Teams include your primary Cardiologist (physician) and Advanced Practice Providers (APPs -  Physician Assistants and Nurse Practitioners) who all work together to provide you with the care you need, when you need it. You will need a follow up appointment in 4 weeks. You may see Dr. Angelena Form  or  Lyda Jester, PA-C  Any Other Special Instructions Will Be Listed Below (If Applicable).  YOU HAVE BEEN REFERRED TO THE LIPID CLINIC  WITH A PHARMACIST.

## 2018-01-11 LAB — TSH: TSH: 5.17 u[IU]/mL — ABNORMAL HIGH (ref 0.450–4.500)

## 2018-01-11 LAB — PRO B NATRIURETIC PEPTIDE: NT-Pro BNP: 69 pg/mL (ref 0–287)

## 2018-01-12 ENCOUNTER — Telehealth: Payer: Self-pay | Admitting: *Deleted

## 2018-01-12 ENCOUNTER — Ambulatory Visit (INDEPENDENT_AMBULATORY_CARE_PROVIDER_SITE_OTHER)
Admission: RE | Admit: 2018-01-12 | Discharge: 2018-01-12 | Disposition: A | Discharge status: Home / Self Care | Payer: Managed Care, Other (non HMO) | Source: Ambulatory Visit | Attending: Cardiovascular Disease | Admitting: Cardiovascular Disease

## 2018-01-12 DIAGNOSIS — R0609 Other forms of dyspnea: Secondary | ICD-10-CM | POA: Diagnosis not present

## 2018-01-12 DIAGNOSIS — R7989 Other specified abnormal findings of blood chemistry: Secondary | ICD-10-CM | POA: Diagnosis not present

## 2018-01-12 MED ORDER — IOPAMIDOL (ISOVUE-370) INJECTION 76%
80.0000 mL | Freq: Once | INTRAVENOUS | Status: AC | PRN
  Administered 2018-01-12: 80 mL via INTRAVENOUS

## 2018-01-12 NOTE — Telephone Encounter (Signed)
-----   Message from [name], MD sent at 01/12/2018  7:50 AM EST ----- Her d-dimer is elevated. Given dyspnea and sinus tachycardia, she will need a chest CTA to exclude PE. Her TSH is also slightly elevated. I would recommend follow up in primary care for further thyroid testing. cdm

## 2018-01-12 NOTE — Telephone Encounter (Signed)
Reviewed with CT department and CTA can be done this morning.  I spoke with pt and reviewed lab results and recommendations from Dr Angelena Form with her.  She will come in for CTA now. Will route labs to primary care.

## 2018-01-18 ENCOUNTER — Telehealth (HOSPITAL_COMMUNITY): Payer: Self-pay

## 2018-01-18 NOTE — Telephone Encounter (Signed)
Spoke with pt, detailed instructions given. Pt stated that she understood and would be here for her appointment. S.Christian Borgerding EMTP

## 2018-01-20 ENCOUNTER — Ambulatory Visit (INDEPENDENT_AMBULATORY_CARE_PROVIDER_SITE_OTHER): Payer: Managed Care, Other (non HMO)

## 2018-01-20 ENCOUNTER — Ambulatory Visit (HOSPITAL_COMMUNITY): Payer: Managed Care, Other (non HMO) | Attending: Cardiology

## 2018-01-20 DIAGNOSIS — R002 Palpitations: Secondary | ICD-10-CM

## 2018-01-20 DIAGNOSIS — R000 Tachycardia, unspecified: Secondary | ICD-10-CM | POA: Diagnosis not present

## 2018-01-20 DIAGNOSIS — I2510 Atherosclerotic heart disease of native coronary artery without angina pectoris: Secondary | ICD-10-CM | POA: Diagnosis present

## 2018-01-20 DIAGNOSIS — R5383 Other fatigue: Secondary | ICD-10-CM | POA: Diagnosis not present

## 2018-01-20 DIAGNOSIS — R0609 Other forms of dyspnea: Secondary | ICD-10-CM

## 2018-01-20 LAB — MYOCARDIAL PERFUSION IMAGING
CHL CUP RESTING HR STRESS: 87 {beats}/min
LV sys vol: 21 mL
LVDIAVOL: 52 mL (ref 46–106)
Peak HR: 106 {beats}/min
SDS: 1
SRS: 0
SSS: 1
TID: 0.94

## 2018-01-20 MED ORDER — TECHNETIUM TC 99M TETROFOSMIN IV KIT
32.5000 | PACK | Freq: Once | INTRAVENOUS | Status: AC | PRN
  Administered 2018-01-20: 32.5 via INTRAVENOUS
  Filled 2018-01-20: qty 33

## 2018-01-20 MED ORDER — REGADENOSON 0.4 MG/5ML IV SOLN
0.4000 mg | Freq: Once | INTRAVENOUS | Status: AC
  Administered 2018-01-20: 0.4 mg via INTRAVENOUS

## 2018-01-20 MED ORDER — TECHNETIUM TC 99M TETROFOSMIN IV KIT
10.2000 | PACK | Freq: Once | INTRAVENOUS | Status: AC | PRN
  Administered 2018-01-20: 10.2 via INTRAVENOUS
  Filled 2018-01-20: qty 11

## 2018-01-21 ENCOUNTER — Encounter: Payer: Managed Care, Other (non HMO) | Admitting: Thoracic Surgery (Cardiothoracic Vascular Surgery)

## 2018-01-25 ENCOUNTER — Other Ambulatory Visit: Payer: Self-pay | Admitting: *Deleted

## 2018-01-25 ENCOUNTER — Other Ambulatory Visit: Payer: Self-pay | Admitting: Thoracic Surgery (Cardiothoracic Vascular Surgery)

## 2018-01-25 ENCOUNTER — Institutional Professional Consult (permissible substitution): Payer: Managed Care, Other (non HMO) | Admitting: Thoracic Surgery (Cardiothoracic Vascular Surgery)

## 2018-01-25 VITALS — BP 160/100 | HR 92 | Resp 20 | Ht 67.0 in | Wt 162.0 lb

## 2018-01-25 DIAGNOSIS — R911 Solitary pulmonary nodule: Secondary | ICD-10-CM

## 2018-01-25 DIAGNOSIS — R918 Other nonspecific abnormal finding of lung field: Secondary | ICD-10-CM | POA: Diagnosis not present

## 2018-01-25 NOTE — Progress Notes (Signed)
PCP is [name], NP Referring Provider is [name]*  Chief Complaint  Patient presents with  . Lung Lesion    Surgical eval, CTA Chest 01/12/18,     HPI: [name] is sent for consultation regarding multiple pulmonary nodules.  [name] is a 62 year old former smoker with a past medical history significant for coronary artery disease with previous stents, asthma, hypertension, hyperlipidemia, reflux, fibromyalgia, depression, and arthritis.  She recently saw cardiology for an annual follow-up.  She complained of exertional dyspnea and palpitations.  She is not having any chest pain.  Work-up included a stress test which was low risk.  A CT angiogram showed no evidence of pulmonary embolus but did show multiple groundglass opacities.  She says she has to walk more slowly to avoid shortness of breath.  She cannot carry anything while she walks.  She is using a rescue inhaler about twice a day.  She does feel herself wheezing.  She also is having frequent palpitations.  She is not having any chest pain, pressure, or tightness.  She has chronic pain from fibromyalgia and arthritis.  She has not had any change in appetite or weight loss. Zubrod Score: At the time of surgery this patient's most appropriate activity status/level should be described as: []     0    Normal activity, no symptoms [x]     1    Restricted in physical strenuous activity but ambulatory, able to do out light work []     2    Ambulatory and capable of self care, unable to do work activities, up and about >50 % of waking hours                              []     3    Only limited self care, in bed greater than 50% of waking hours []     4    Completely disabled, no self care, confined to bed or chair []     5    Moribund   Past Medical History:  Diagnosis Date  . Anemia    "long time ago" (12/06/2012)  . Arthritis    "right knee real bad; in my hands bad" (12/06/2012)  . Asthma   . Coronary artery  disease    a. s/p Xience DES to [facility] 04/2008;  b. LHC 05/2008: Proximal RCA 25%, mid RCA stent patent.  c. Anormal nuc 2014 -> s/p LHC with severe mRCA stenosis s/p DES. d. Cath 04/2013 s/p DES to LAD.  e. LHC 08/2015 was stable.  . Depression    "years ago" (12/06/2012)  . Fibromyalgia   . GERD (gastroesophageal reflux disease)   . H/O hiatal hernia   . Hepatitis    "not A, B, or C" (12/06/2012)  . Hyperlipidemia    intol to statins and other chol agents due to elevated LFTs  . Hypertension   . Migraines    "when I was younger" (12/06/2012)  . Premature atrial contractions   . PVC's (premature ventricular contractions)   . Sinus headache   . SVT (supraventricular tachycardia) (HCC)     Past Surgical History:  Procedure Laterality Date  . CARDIAC CATHETERIZATION  04/2008; 05/2008  . CARDIAC CATHETERIZATION N/A 08/07/2015   Procedure: Left Heart Cath and Coronary Angiography;  Surgeon: Sherren Mocha, MD;  Location: [place] CV LAB;  Service: Cardiovascular;  Laterality: N/A;  . CHOLECYSTECTOMY    . CORONARY ANGIOPLASTY  WITH STENT PLACEMENT  04/2008 12/06/2012   "1 + 1" (12/06/2012)  . CORONARY STENT PLACEMENT  05/08/2013   DES TO LAD      DR MCALHANY  . LEFT HEART CATHETERIZATION WITH CORONARY ANGIOGRAM N/A 12/06/2012   Procedure: LEFT HEART CATHETERIZATION WITH CORONARY ANGIOGRAM;  Surgeon: [name], MD;  Location: [facility] CATH LAB;  Service: Cardiovascular;  Laterality: N/A;  . LEFT HEART CATHETERIZATION WITH CORONARY ANGIOGRAM N/A 05/08/2013   Procedure: LEFT HEART CATHETERIZATION WITH CORONARY ANGIOGRAM;  Surgeon: [name], MD;  Location: [facility] CATH LAB;  Service: Cardiovascular;  Laterality: N/A;  . LEFT HEART CATHETERIZATION WITH CORONARY ANGIOGRAM N/A 01/04/2014   Procedure: LEFT HEART CATHETERIZATION WITH CORONARY ANGIOGRAM;  Surgeon: [name], MD;  Location: [facility] CATH LAB;  Service: Cardiovascular;  Laterality: N/A;  . PERCUTANEOUS CORONARY STENT  INTERVENTION (PCI-S)  12/06/2012   Procedure: PERCUTANEOUS CORONARY STENT INTERVENTION (PCI-S);  Surgeon: [name], MD;  Location: [facility] CATH LAB;  Service: Cardiovascular;;  . PERCUTANEOUS CORONARY STENT INTERVENTION (PCI-S)  05/08/2013   Procedure: PERCUTANEOUS CORONARY STENT INTERVENTION (PCI-S);  Surgeon: [name], MD;  Location: [facility] CATH LAB;  Service: Cardiovascular;;  mid LAD   . TUBAL LIGATION    . VAGINAL HYSTERECTOMY      Family History  Problem Relation Age of Onset  . CAD Neg Hx     Social History Social History   Tobacco Use  . Smoking status: Former Smoker    Packs/day: 0.50    Years: 30.00    Pack years: 15.00    Types: Cigarettes  . Smokeless tobacco: Never Used  . Tobacco comment: 12/06/2012 "quit smoking cigarettes ~ 8 yr ago"  Substance Use Topics  . Alcohol use: No  . Drug use: No    Current Outpatient Medications  Medication Sig Dispense Refill  . albuterol (PROAIR HFA) 108 (90 Base) MCG/ACT inhaler Inhale 1 puff into the lungs 2 (two) times daily.    Marland Kitchen albuterol (PROVENTIL HFA;VENTOLIN HFA) 108 (90 BASE) MCG/ACT inhaler Inhale 2 puffs into the lungs every 6 (six) hours as needed for wheezing.    Marland Kitchen albuterol (PROVENTIL) (5 MG/ML) 0.5% nebulizer solution Take 2.5 mg by nebulization every 6 (six) hours as needed for wheezing.    Marland Kitchen amLODipine (NORVASC) 5 MG tablet Take 1.5 tablets (7.5 mg total) by mouth daily. 180 tablet 3  . aspirin 81 MG chewable tablet Chew 1 tablet (81 mg total) by mouth daily.    Marland Kitchen buPROPion (WELLBUTRIN SR) 150 MG 12 hr tablet Take 150 mg by mouth 2 (two) times daily.    . clopidogrel (PLAVIX) 75 MG tablet Take 1 tablet (75 mg total) by mouth daily. MUST SCHEDULE APPOINTMENT FOR FURTHER REFILLS 30 tablet 0  . DULoxetine (CYMBALTA) 60 MG capsule Take 40 mg by mouth daily.  0  . ergocalciferol (VITAMIN D2) 50000 UNITS capsule Take 50,000 Units by mouth once a week. wednesday    . HYDROcodone-acetaminophen (NORCO)  7.5-325 MG per tablet Take 1 tablet by mouth every 6 (six) hours as needed for pain.    Marland Kitchen LORazepam (ATIVAN) 2 MG tablet Take 2 mg by mouth at bedtime.     . metoprolol tartrate (LOPRESSOR) 25 MG tablet Take 0.5 tablets (12.5 mg total) by mouth 2 (two) times daily. 30 tablet 9  . montelukast (SINGULAIR) 10 MG tablet Take 10 mg by mouth at bedtime as needed (for allergies).     . nitroGLYCERIN (NITROSTAT) 0.4 MG SL tablet Place  1 tablet (0.4 mg total) under the tongue every 5 (five) minutes as needed for chest pain. 25 tablet 12  . SYMBICORT 160-4.5 MCG/ACT inhaler Take 2 puffs by mouth 2 (two) times daily.  3   No current facility-administered medications for this visit.     Allergies  Allergen Reactions  . Levofloxacin   . Statins Other (See Comments)    Liver enzymes  . Tricor [Fenofibrate] Hives and Itching  . Ciprofloxacin Hives  . Codeine Nausea And Vomiting  . Metoprolol Other (See Comments)    Does not tolerate beta blockers 10/02/2013-pt states she can tolerate 25 mg and lower     Review of Systems  Constitutional: Positive for activity change and fatigue. Negative for appetite change and unexpected weight change.  HENT: Negative for trouble swallowing and voice change.   Eyes: Negative for visual disturbance.  Respiratory: Positive for shortness of breath and wheezing. Negative for cough.   Cardiovascular: Positive for palpitations. Negative for chest pain.  Gastrointestinal: Positive for abdominal pain (Reflux) and diarrhea.  Genitourinary: Negative for difficulty urinating and dysuria.  Musculoskeletal: Positive for arthralgias, back pain and joint swelling.  Neurological: Positive for numbness. Negative for dizziness and headaches.       Chronic pain  All other systems reviewed and are negative.   BP (!) 160/100   Pulse 92   Resp 20   Ht 5' 7"  (1.702 m)   Wt 162 lb (73.5 kg)   SpO2 97% Comment: RA  BMI 25.37 kg/m  Physical Exam  Constitutional: She is oriented  to person, place, and time. She appears well-developed and well-nourished. No distress.  HENT:  Head: Normocephalic and atraumatic.  Mouth/Throat: No oropharyngeal exudate.  Eyes: Conjunctivae and EOM are normal. No scleral icterus.  Neck: Neck supple. No thyromegaly present.  Cardiovascular: Normal rate, regular rhythm and normal heart sounds. Exam reveals no friction rub.  No murmur heard. Pulmonary/Chest: Effort normal and breath sounds normal. No respiratory distress. She has no wheezes. She has no rales.  Abdominal: Soft. She exhibits no distension. There is no tenderness.  Musculoskeletal: She exhibits no edema.  Lymphadenopathy:    She has no cervical adenopathy.  Neurological: She is alert and oriented to person, place, and time. No cranial nerve deficit. She exhibits normal muscle tone. Coordination normal.  Skin: Skin is warm and dry.  Vitals reviewed.    Diagnostic Tests: CT ANGIOGRAPHY CHEST WITH CONTRAST  TECHNIQUE: Multidetector CT imaging of the chest was performed using the standard protocol during bolus administration of intravenous contrast. Multiplanar CT image reconstructions and MIPs were obtained to evaluate the vascular anatomy.  CONTRAST:  37m ISOVUE-370 IOPAMIDOL (ISOVUE-370) INJECTION 76%  COMPARISON:  06/12/2008  FINDINGS: Cardiovascular: There are no filling defects in the pulmonary arterial to suggest acute pulmonary thromboembolism. There is no evidence of aortic dissection or aneurysm. Mild atherosclerotic calcifications of the aortic arch. The left vertebral artery originates from the aortic arch. Right vertebral artery is patent within the confines of the examination. Mild 3 vessel coronary artery calcifications.  Mediastinum/Nodes: There is no abnormal mediastinal adenopathy. No pericardial effusion. Esophagus is within normal limits.  Lungs/Pleura: No pneumothorax.  No pleural effusion.  A ground-glass opacity in the superior  segment of the left lower lobe on image 23 of series 6 is stable. A ground-glass opacity in the superior segment of the left lower lobe on image 32 is larger, now measuring 2.5 cm and associated with adjacent pleural thickening. There is also a 1.0  cm sub solid nodule in the right upper lobe on image 31. Ground-glass in the anterior right upper lobe on image 40 is stable. Patchy ground-glass opacities in the lingula on image 50 are more prominent.  Upper Abdomen: No acute abnormality.  Musculoskeletal: No vertebral compression deformity.  Review of the MIP images confirms the above findings.  IMPRESSION: No evidence of acute pulmonary thromboembolism.  Bilateral ground-glass opacities and sub solid nodules have increased in prominence compared to the prior study dated 2010. Findings may represent an inflammatory process, however slow-growing malignancy such as adenocarcinoma is not excluded. Consider multi disciplinary thoracic oncology conference discussion for further management.  Aortic Atherosclerosis (ICD10-I70.0).   Electronically Signed   By: Marybelle Killings M.D.   On: 01/12/2018 09:51 I personally reviewed the CT images and concur with the findings noted above  Impression: [name] is a 62 year old former smoker who has multiple groundglass opacities in her lungs bilaterally.  There are 2 nodules in the superior segment of the left lower lobe.  One is completely unchanged from the CT dating back to 2010.  The second is larger and now has a questionable solid component near the pleura with some pleural thickening in that area.  There also is a 10 mm nodule in the right upper lobe that is a mixed solid/sub-solid nodule.  The differential diagnosis includes primary bronchogenic carcinoma, atypical infections, as well as inflammatory nodules.  I think the next order of business is to do a PET/CT to guide her initial diagnostic work-up.  This could help [place] to decide  between surgical resection versus biopsy and also whether to address the right or left side first.  It may be that none of the nodules light up and I did tell her that this is not necessarily rule out the possibility of cancer if that were to happen.  We need to get some pulmonary function testing with and without bronchodilators to assess her pulmonary reserve.  She has quite a bit of exertional dyspnea.  She had a low risk stress test is less likely to be cardiac in origin.  It is unclear if she can tolerate any type of surgical intervention.  Plan: PET/CT to guide initial diagnostic work-up Pulmonary function testing with and without bronchodilators to assess pulmonary reserve Return in 2 weeks to discuss results.  Melrose Nakayama, MD [facility] [phone]

## 2018-02-01 ENCOUNTER — Ambulatory Visit (INDEPENDENT_AMBULATORY_CARE_PROVIDER_SITE_OTHER): Payer: Managed Care, Other (non HMO) | Admitting: Pharmacist

## 2018-02-01 DIAGNOSIS — E785 Hyperlipidemia, unspecified: Secondary | ICD-10-CM | POA: Diagnosis not present

## 2018-02-01 NOTE — Progress Notes (Signed)
Patient ID: [name]                 DOB: [birth year]                    MRN: [id number]     HPI: [name] is a 62 y.o. female patient of Dr Angelena Form referred to lipid clinic by Lyda Jester, PA. PMH is significant for CAD s/p multiple prior stents, unstable angina, PCVs, PACs, HTN, HLD, and fibromyalgia. She previously experienced LFT elevations on statin therapy and was previously seen in lipid clinic in 2017. She was lost to follow up after missing multiple lipid appointments and despite multiple attempts to contact.  Pt reports prior pneumonia that limited her availability to come in for appointments. She is seeing her thoracic surgeon later this week and is having a full body scan - she thinks she may have lung cancer. Her cholesterol treatment is very low on her priority list pending the results of her follow up.   Patient reports that she has tried multiple statins previously, including Lipitor, Crestor, simvastatin, and pravastatin. She reports elevated LFTs with ALT > 500 and muscle aches (also has fibromyalgia). She also reports that bilirubin has been elevated, and that per Dr. Earlean Shawl, she has had hepatitis. Pt also reports that she had myalgias with Zetia and hives with fenofibrate. She sees [name] with [facility] did provide information that pt previously took atorvastatin 21m daily and Zetia 115mdaily.  Current Medications: none Intolerances: atorvastatin 2039maily, Crestor, simvastatin, pravastatin - myalgias and LFT elevations. Fenofibrate - hives and itching. Zetia - myalgias. Risk Factors: CAD s/p stenting of RCA and LAD, unstable angina LDL goal: < 58m72m  Diet: Pt avoids fried food. Likes baked chicken, fruit, and veggies. Does have a sweet tooth. Eats donuts. Does not drink alcohol frequently.   Exercise: Fibromyalgia limites ability to exercise.  Family History: Non-contributory  Social History: Former smoker 1/2 PPD for 30  years, quit smoking in 2006. Denies alcohol and illicit drug use.  Labs: 08/22/15: TC 262, TG 183, HDL 62, LDL 163 (no lipid lowering therapy)  Past Medical History:  Diagnosis Date  . Anemia    "long time ago" (12/06/2012)  . Arthritis    "right knee real bad; in my hands bad" (12/06/2012)  . Asthma   . Coronary artery disease    [facility];  b. LHC 05/2008: Proximal RCA 25%, mid RCA stent patent.  c. Anormal nuc 2014 -> s/p LHC with severe mRCA stenosis s/p DES. d. Cath 04/2013 s/p DES to LAD.  e. LHC 08/2015 was stable.  . Depression    "years ago" (12/06/2012)  . Fibromyalgia   . GERD (gastroesophageal reflux disease)   . H/O hiatal hernia   . Hepatitis    "not A, B, or C" (12/06/2012)  . Hyperlipidemia    intol to statins and other chol agents due to elevated LFTs  . Hypertension   . Migraines    "when I was younger" (12/06/2012)  . Premature atrial contractions   . PVC's (premature ventricular contractions)   . Sinus headache   . SVT (supraventricular tachycardia) (HCC)[place]  Current Outpatient Medications on File Prior to Visit  Medication Sig Dispense Refill  . albuterol (PROAIR HFA) 108 (90 Base) MCG/ACT inhaler Inhale 1 puff into the lungs 2 (two) times daily.    . alMarland Kitchenuterol (PROVENTIL HFA;VENTOLIN HFA) 108 (90  BASE) MCG/ACT inhaler Inhale 2 puffs into the lungs every 6 (six) hours as needed for wheezing.    Marland Kitchen albuterol (PROVENTIL) (5 MG/ML) 0.5% nebulizer solution Take 2.5 mg by nebulization every 6 (six) hours as needed for wheezing.    Marland Kitchen amLODipine (NORVASC) 5 MG tablet Take 1.5 tablets (7.5 mg total) by mouth daily. 180 tablet 3  . aspirin 81 MG chewable tablet Chew 1 tablet (81 mg total) by mouth daily.    Marland Kitchen buPROPion (WELLBUTRIN SR) 150 MG 12 hr tablet Take 150 mg by mouth 2 (two) times daily.    . clopidogrel (PLAVIX) 75 MG tablet Take 1 tablet (75 mg total) by mouth daily. MUST SCHEDULE APPOINTMENT FOR FURTHER REFILLS 30 tablet 0  . DULoxetine  (CYMBALTA) 60 MG capsule Take 40 mg by mouth daily.  0  . ergocalciferol (VITAMIN D2) 50000 UNITS capsule Take 50,000 Units by mouth once a week. wednesday    . HYDROcodone-acetaminophen (NORCO) 7.5-325 MG per tablet Take 1 tablet by mouth every 6 (six) hours as needed for pain.    Marland Kitchen LORazepam (ATIVAN) 2 MG tablet Take 2 mg by mouth at bedtime.     . metoprolol tartrate (LOPRESSOR) 25 MG tablet Take 0.5 tablets (12.5 mg total) by mouth 2 (two) times daily. 30 tablet 9  . montelukast (SINGULAIR) 10 MG tablet Take 10 mg by mouth at bedtime as needed (for allergies).     . nitroGLYCERIN (NITROSTAT) 0.4 MG SL tablet Place 1 tablet (0.4 mg total) under the tongue every 5 (five) minutes as needed for chest pain. 25 tablet 12  . SYMBICORT 160-4.5 MCG/ACT inhaler Take 2 puffs by mouth 2 (two) times daily.  3   No current facility-administered medications on file prior to visit.     Allergies  Allergen Reactions  . Levofloxacin   . Statins Other (See Comments)    Liver enzymes  . Tricor [Fenofibrate] Hives and Itching  . Ciprofloxacin Hives  . Codeine Nausea And Vomiting  . Metoprolol Other (See Comments)    Does not tolerate beta blockers 10/02/2013-pt states she can tolerate 25 mg and lower     Assessment/Plan:  1. Hyperlipidemia - Will check baseline lipids and LFTs since she has not had labs checked in 2 years. She is intolerant to 4 statins and Zetia. LDL goal < 70 due to hx of ASCVD. Discussed PCSK9i therapy. Pt is interested pending results from her upcoming appt with her surgeon. If she does have cancer, she does not want to pursue PCSK9i therapy. If everything comes back clear, she is willing to start PCSK9i therapy. One of her friends takes [place] and is tolerating well. Will call pt in a few weeks to see if she would like to start PCSK9i therapy.    E. Supple, PharmD, BCACP, [place] [address] [place], [place] [address] Phone: [phone];  Fax: [phone] 02/01/2018 3:36 PM

## 2018-02-02 ENCOUNTER — Other Ambulatory Visit: Payer: Managed Care, Other (non HMO)

## 2018-02-02 LAB — HEPATIC FUNCTION PANEL
ALBUMIN: 4.4 g/dL (ref 3.6–4.8)
ALT: 53 IU/L — AB (ref 0–32)
AST: 35 IU/L (ref 0–40)
Alkaline Phosphatase: 97 IU/L (ref 39–117)
BILIRUBIN TOTAL: 0.3 mg/dL (ref 0.0–1.2)
Bilirubin, Direct: 0.09 mg/dL (ref 0.00–0.40)
Total Protein: 6.7 g/dL (ref 6.0–8.5)

## 2018-02-02 LAB — LIPID PANEL
CHOL/HDL RATIO: 5.6 ratio — AB (ref 0.0–4.4)
Cholesterol, Total: 284 mg/dL — ABNORMAL HIGH (ref 100–199)
HDL: 51 mg/dL (ref >39)
LDL Calculated: 183 mg/dL — ABNORMAL HIGH (ref 0–99)
Triglycerides: 248 mg/dL — ABNORMAL HIGH (ref 0–149)
VLDL CHOLESTEROL CAL: 50 mg/dL — AB (ref 5–40)

## 2018-02-02 LAB — LDL CHOLESTEROL, DIRECT: LDL DIRECT: 198 mg/dL — AB (ref 0–99)

## 2018-02-03 ENCOUNTER — Encounter (HOSPITAL_COMMUNITY)
Admission: RE | Admit: 2018-02-03 | Discharge: 2018-02-03 | Disposition: A | Discharge status: Home / Self Care | Payer: Managed Care, Other (non HMO) | Source: Ambulatory Visit | Attending: Thoracic Surgery (Cardiothoracic Vascular Surgery) | Admitting: Thoracic Surgery (Cardiothoracic Vascular Surgery)

## 2018-02-03 ENCOUNTER — Ambulatory Visit (HOSPITAL_COMMUNITY)
Admission: RE | Admit: 2018-02-03 | Discharge: 2018-02-03 | Disposition: A | Discharge status: Home / Self Care | Payer: Managed Care, Other (non HMO) | Source: Ambulatory Visit | Attending: Thoracic Surgery (Cardiothoracic Vascular Surgery) | Admitting: Thoracic Surgery (Cardiothoracic Vascular Surgery)

## 2018-02-03 DIAGNOSIS — R911 Solitary pulmonary nodule: Secondary | ICD-10-CM

## 2018-02-03 LAB — PULMONARY FUNCTION TEST
DL/VA % PRED: 80 %
DL/VA: 4.12 ml/min/mmHg/L
DLCO UNC % PRED: 51 %
DLCO unc: 14.56 ml/min/mmHg
FEF 25-75 POST: 0.31 L/s
FEF 25-75 Pre: 0.22 L/sec
FEF2575-%Change-Post: 37 %
FEF2575-%Pred-Post: 12 %
FEF2575-%Pred-Pre: 9 %
FEV1-%Change-Post: 2 %
FEV1-%Pred-Post: 31 %
FEV1-%Pred-Pre: 30 %
FEV1-Post: 0.87 L
FEV1-Pre: 0.84 L
FEV1FVC-%CHANGE-POST: -7 %
FEV1FVC-%PRED-PRE: 53 %
FEV6-%Change-Post: 12 %
FEV6-%Pred-Post: 53 %
FEV6-%Pred-Pre: 47 %
FEV6-Post: 1.86 L
FEV6-Pre: 1.65 L
FEV6FVC-%Change-Post: 1 %
FEV6FVC-%Pred-Post: 85 %
FEV6FVC-%Pred-Pre: 83 %
FVC-%Change-Post: 11 %
FVC-%Pred-Post: 63 %
FVC-%Pred-Pre: 56 %
FVC-Post: 2.27 L
FVC-Pre: 2.04 L
POST FEV1/FVC RATIO: 38 %
Post FEV6/FVC ratio: 82 %
Pre FEV1/FVC ratio: 41 %
Pre FEV6/FVC Ratio: 81 %
RV % pred: 168 %
RV: 3.71 L
TLC % pred: 112 %
TLC: 6.18 L

## 2018-02-03 LAB — GLUCOSE, CAPILLARY: Glucose-Capillary: 115 mg/dL — ABNORMAL HIGH (ref 70–99)

## 2018-02-03 MED ORDER — FLUDEOXYGLUCOSE F - 18 (FDG) INJECTION
8.4000 | Freq: Once | INTRAVENOUS | Status: AC
  Administered 2018-02-03: 8.4 via INTRAVENOUS

## 2018-02-03 MED ORDER — ALBUTEROL SULFATE (2.5 MG/3ML) 0.083% IN NEBU
2.5000 mg | INHALATION_SOLUTION | Freq: Once | RESPIRATORY_TRACT | Status: AC
  Administered 2018-02-03: 2.5 mg via RESPIRATORY_TRACT

## 2018-02-04 ENCOUNTER — Ambulatory Visit: Payer: Managed Care, Other (non HMO) | Admitting: Thoracic Surgery (Cardiothoracic Vascular Surgery)

## 2018-02-07 ENCOUNTER — Ambulatory Visit: Payer: Managed Care, Other (non HMO) | Admitting: Cardiology

## 2018-02-08 ENCOUNTER — Other Ambulatory Visit: Payer: Self-pay

## 2018-02-08 ENCOUNTER — Ambulatory Visit: Payer: Managed Care, Other (non HMO) | Admitting: Thoracic Surgery (Cardiothoracic Vascular Surgery)

## 2018-02-08 ENCOUNTER — Encounter: Payer: Self-pay | Admitting: Thoracic Surgery (Cardiothoracic Vascular Surgery)

## 2018-02-08 VITALS — BP 130/84 | HR 73 | Resp 16 | Ht 67.0 in | Wt 162.0 lb

## 2018-02-08 DIAGNOSIS — R918 Other nonspecific abnormal finding of lung field: Secondary | ICD-10-CM

## 2018-02-08 MED ORDER — AMOXICILLIN 500 MG PO CAPS: 500.0000 mg | ORAL_CAPSULE | Freq: Three times a day (TID) | ORAL | 0 refills | Status: DC

## 2018-02-08 MED ORDER — PREDNISONE 10 MG (21) PO TBPK: ORAL_TABLET | ORAL | 0 refills | Status: DC

## 2018-02-08 NOTE — Progress Notes (Signed)
[address]       [place] [address]             [phone]     HPI: [name] returns for follow-up regarding multiple lung nodules.  [name] is a 62 year old woman with a history of tobacco abuse, COPD, asthma, coronary artery disease with previous stents, hypertension, hyperlipidemia, reflux, fibromyalgia, depression, and arthritis.  She recently saw Dr. Angelena Form for an annual follow-up visit.  She complained of worsening exertional dyspnea.  A stress test was low risk.  A CT angiogram showed no evidence of pulmonary embolus but did show multiple groundglass opacities bilaterally.  She does have a history of tobacco abuse but quit smoking 8 years ago.  According to her daughter she does have mold in her house.  Over the past week she has been having a frequent cough with some pleuritic chest pain.  Cough has been productive of thick yellow sputum.  Past Medical History:  Diagnosis Date  . Anemia    "long time ago" (12/06/2012)  . Arthritis    "right knee real bad; in my hands bad" (12/06/2012)  . Asthma   . Coronary artery disease    a. s/p Xience DES to [facility] 04/2008;  b. LHC 05/2008: Proximal RCA 25%, mid RCA stent patent.  c. Anormal nuc 2014 -> s/p LHC with severe mRCA stenosis s/p DES. d. Cath 04/2013 s/p DES to LAD.  e. LHC 08/2015 was stable.  . Depression    "years ago" (12/06/2012)  . Fibromyalgia   . GERD (gastroesophageal reflux disease)   . H/O hiatal hernia   . Hepatitis    "not A, B, or C" (12/06/2012)  . Hyperlipidemia    intol to statins and other chol agents due to elevated LFTs  . Hypertension   . Migraines    "when I was younger" (12/06/2012)  . Premature atrial contractions   . PVC's (premature ventricular contractions)   . Sinus headache   . SVT (supraventricular tachycardia) (HCC)     Current Outpatient Medications  Medication Sig Dispense Refill  . albuterol (PROAIR HFA) 108 (90 Base) MCG/ACT inhaler Inhale 1 puff into the lungs 2  (two) times daily.    Marland Kitchen albuterol (PROVENTIL HFA;VENTOLIN HFA) 108 (90 BASE) MCG/ACT inhaler Inhale 2 puffs into the lungs every 6 (six) hours as needed for wheezing.    Marland Kitchen albuterol (PROVENTIL) (5 MG/ML) 0.5% nebulizer solution Take 2.5 mg by nebulization every 6 (six) hours as needed for wheezing.    Marland Kitchen amLODipine (NORVASC) 5 MG tablet Take 1.5 tablets (7.5 mg total) by mouth daily. 180 tablet 3  . aspirin 81 MG chewable tablet Chew 1 tablet (81 mg total) by mouth daily.    Marland Kitchen buPROPion (WELLBUTRIN SR) 150 MG 12 hr tablet Take 150 mg by mouth 2 (two) times daily.    . clopidogrel (PLAVIX) 75 MG tablet Take 1 tablet (75 mg total) by mouth daily. MUST SCHEDULE APPOINTMENT FOR FURTHER REFILLS 30 tablet 0  . ergocalciferol (VITAMIN D2) 50000 UNITS capsule Take 50,000 Units by mouth once a week. wednesday    . HYDROcodone-acetaminophen (NORCO) 7.5-325 MG per tablet Take 1 tablet by mouth every 6 (six) hours as needed for pain.    Marland Kitchen levothyroxine (SYNTHROID, LEVOTHROID) 25 MCG tablet Take 25 mcg by mouth daily before breakfast.    . LORazepam (ATIVAN) 2 MG tablet Take 2 mg by mouth at bedtime.     . metoprolol tartrate (  LOPRESSOR) 25 MG tablet Take 0.5 tablets (12.5 mg total) by mouth 2 (two) times daily. 30 tablet 9  . montelukast (SINGULAIR) 10 MG tablet Take 10 mg by mouth at bedtime as needed (for allergies).     . nitroGLYCERIN (NITROSTAT) 0.4 MG SL tablet Place 1 tablet (0.4 mg total) under the tongue every 5 (five) minutes as needed for chest pain. 25 tablet 12  . SYMBICORT 160-4.5 MCG/ACT inhaler Take 2 puffs by mouth 2 (two) times daily.  3  . amoxicillin (AMOXIL) 500 MG capsule Take 1 capsule (500 mg total) by mouth 3 (three) times daily. 21 capsule 0  . predniSONE (STERAPRED UNI-PAK 21 TAB) 10 MG (21) TBPK tablet Take 6 tabs by mouth day 1, 5 tabs day 2, 4 tabs day 3, 3 tabs day 4, 2 tabs day 5 and 1 tab day 6 21 tablet 0   No current facility-administered medications for this visit.      Physical Exam BP 130/84 (BP Location: Right Arm, Patient Position: Sitting, Cuff Size: Normal)   Pulse 73   Resp 16   Ht 5' 7"  (1.702 m)   Wt 162 lb (73.5 kg)   SpO2 93% Comment: RA  BMI 25.47 kg/m  62 year old woman in no acute distress Alert and oriented x3 with no focal deficits No cervical or supraclavicular adenopathy Cardiac regular rate and rhythm normal S1 and S2 Lungs wheezing bilaterally No clubbing cyanosis or edema  Diagnostic Tests: NUCLEAR MEDICINE PET SKULL BASE TO THIGH  TECHNIQUE: 8.4 mCi F-18 FDG was injected intravenously. Full-ring PET imaging was performed from the skull base to thigh after the radiotracer. CT data was obtained and used for attenuation correction and anatomic localization.  Fasting blood glucose: 115 mg/dl  COMPARISON:  Chest CT from 01/12/2018  FINDINGS: Mediastinal blood pool activity: SUV max 2.5  NECK: A focus of accentuated metabolic activity most closely correlating to a small deep parotid node on the right image 25/4 has maximum SUV of 4.3. The associated node measures about 0.6 by 1.1 cm on image 25/4.  Symmetric and likely physiologic activity along the palatine tonsils glottic structures.  Incidental CT findings: Mild chronic left maxillary sinusitis.  CHEST:  Focal ground-glass opacity in the superior segment left lower lobe, 1.0 by 0.8 cm on image 16/8, maximum SUV 0.8.  The indistinct approximately 1.7 by 1.0 cm ground-glass opacity further caudad in the superior segment left lower lobe on image 22/8 has a maximum SUV of 0.8.  The 1.4 by 0.9 cm right upper lobe ground-glass density nodule on image 25/8 has a maximum SUV of 1.3.  Anteriorly in the right upper lobe a 1.1 by 0.8 cm ground-glass density nodule on image 29/8 has a maximum SUV of 0.8.  Posteriorly in the left upper lobe, a 1.6 by 1.3 cm ground-glass density nodule on image 36/8 has a maximum SUV of 0.6.  Incidental CT  findings: Centrilobular emphysema. Coronary, aortic arch, and branch vessel atherosclerotic vascular disease.  ABDOMEN/PELVIS: Accentuated activity along the transverse abdominis muscles, right greater than left, without appreciable CT abnormality, likely physiologic.  Incidental CT findings: Cholecystectomy. Aortoiliac atherosclerotic vascular disease. Pelvic floor laxity.  SKELETON: No significant abnormal hypermetabolic activity in this region.  Incidental CT findings: none  IMPRESSION: 1. Five ground-glass opacity nodules all demonstrate low-grade activity. The highest activity is associated with the 1.4 by 0.9 cm right upper lobe nodule shown on image 25/8, but the maximum SUV of that nodule is only 1.3. Low-grade adenocarcinoma can  have a low maximum standard uptake value, and careful attention to and surveillance of morphologic changes in these nodules diagnostic CT scans should be ascribed high value in assessing for low-grade adenocarcinoma. 2. Small focus of accentuated activity along the deep portion of the right parotid gland has maximum SUV of 4.3, mildly elevated. This could be from the small 0.6 cm parotid lymph node in this vicinity, versus a small Warthin's tumor. 3. Other imaging findings of potential clinical significance: Aortic Atherosclerosis (ICD10-I70.0) and Emphysema (ICD10-J43.9). Coronary atherosclerosis. Pelvic floor laxity.   Electronically Signed   By: Van Clines M.D.   On: 02/04/2018 07:38 I personally reviewed the PET/CT images and concur with the findings noted above.  Pulmonary function testing FVC equals 2.04 (56%) FEV1 0.84 (30%) FEV1 0.87 (31%) postbronchodilator TLC 6.18 (112%) Residual volume 3.71 (168%) DLCO 14.56 (51%)  Impression: [name] is a 62 year old former smoker with a history of COPD who has multiple bilateral groundglass opacities.  A couple of these groundglass opacities do have small solid  components.  On PET CT these are all faintly active.  Findings are worrisome for multifocal low-grade adenocarcinomas.  AFB or fungal infections are also in the differential.  She needs a biopsy to guide therapy.  Options for biopsy include CT-guided biopsy, wedge resection, and navigational bronchoscopy.  I would favor navigational bronchoscopy which will allow [place] to sample both sides.  I think there is approximately an 80% chance will be able to get a definitive diagnosis.  I described the post procedure to [name] and her daughter.  They understand this is diagnostic and not therapeutic.  We will plan to do it in the operating room under general anesthesia on an outpatient basis.  I informed him of the indications, risk, benefits, and alternatives.  They understand the risks include, but not limited to those associated with general anesthesia including death, MI, and stroke as well as procedure specific risk such as bleeding, pneumothorax, and failure to make a diagnosis.  She accepts the risks and wishes to proceed.  Right parotid gland nodule-hypermetabolic on PET.  Will refer to ENT to see if any additional work-up is needed.  Plan: Navigational bronchoscopy on Thursday, 02/17/2018  Melrose Nakayama, MD [facility] [phone]

## 2018-02-08 NOTE — H&P (View-Only) (Signed)
[address]       [place] [address]             [phone]     HPI: [name] returns for follow-up regarding multiple lung nodules.  [name] is a 62 year old woman with a history of tobacco abuse, COPD, asthma, coronary artery disease with previous stents, hypertension, hyperlipidemia, reflux, fibromyalgia, depression, and arthritis.  She recently saw Dr. Angelena [name] for an annual follow-up visit.  She complained of worsening exertional dyspnea.  A stress test was low risk.  A CT angiogram showed no evidence of pulmonary embolus but did show multiple groundglass opacities bilaterally.  She does have a history of tobacco abuse but quit smoking 8 years ago.  According to her daughter she does have mold in her house.  Over the past week she has been having a frequent cough with some pleuritic chest pain.  Cough has been productive of thick yellow sputum.  Past Medical History:  Diagnosis Date  . Anemia    "long time ago" (12/06/2012)  . Arthritis    "right knee real bad; in my hands bad" (12/06/2012)  . Asthma   . Coronary artery disease    a. s/p Xience DES to [facility] 04/2008;  b. LHC 05/2008: Proximal RCA 25%, mid RCA stent patent.  c. Anormal nuc 2014 -> s/p LHC with severe mRCA stenosis s/p DES. d. Cath 04/2013 s/p DES to LAD.  e. LHC 08/2015 was stable.  . Depression    "years ago" (12/06/2012)  . Fibromyalgia   . GERD (gastroesophageal reflux disease)   . H/O hiatal hernia   . Hepatitis    "not A, B, or C" (12/06/2012)  . Hyperlipidemia    intol to statins and other chol agents due to elevated LFTs  . Hypertension   . Migraines    "when I was younger" (12/06/2012)  . Premature atrial contractions   . PVC's (premature ventricular contractions)   . Sinus headache   . SVT (supraventricular tachycardia) (HCC)     Current Outpatient Medications  Medication Sig Dispense Refill  . albuterol (PROAIR HFA) 108 (90 Base) MCG/ACT inhaler Inhale 1 puff into the lungs 2  (two) times daily.    Marland Kitchen albuterol (PROVENTIL HFA;VENTOLIN HFA) 108 (90 BASE) MCG/ACT inhaler Inhale 2 puffs into the lungs every 6 (six) hours as needed for wheezing.    Marland Kitchen albuterol (PROVENTIL) (5 MG/ML) 0.5% nebulizer solution Take 2.5 mg by nebulization every 6 (six) hours as needed for wheezing.    Marland Kitchen amLODipine (NORVASC) 5 MG tablet Take 1.5 tablets (7.5 mg total) by mouth daily. 180 tablet 3  . aspirin 81 MG chewable tablet Chew 1 tablet (81 mg total) by mouth daily.    Marland Kitchen buPROPion (WELLBUTRIN SR) 150 MG 12 hr tablet Take 150 mg by mouth 2 (two) times daily.    . clopidogrel (PLAVIX) 75 MG tablet Take 1 tablet (75 mg total) by mouth daily. MUST SCHEDULE APPOINTMENT FOR FURTHER REFILLS 30 tablet 0  . ergocalciferol (VITAMIN D2) 50000 UNITS capsule Take 50,000 Units by mouth once a week. wednesday    . HYDROcodone-acetaminophen (NORCO) 7.5-325 MG per tablet Take 1 tablet by mouth every 6 (six) hours as needed for pain.    Marland Kitchen levothyroxine (SYNTHROID, LEVOTHROID) 25 MCG tablet Take 25 mcg by mouth daily before breakfast.    . LORazepam (ATIVAN) 2 MG tablet Take 2 mg by mouth at bedtime.     . metoprolol tartrate (  LOPRESSOR) 25 MG tablet Take 0.5 tablets (12.5 mg total) by mouth 2 (two) times daily. 30 tablet 9  . montelukast (SINGULAIR) 10 MG tablet Take 10 mg by mouth at bedtime as needed (for allergies).     . nitroGLYCERIN (NITROSTAT) 0.4 MG SL tablet Place 1 tablet (0.4 mg total) under the tongue every 5 (five) minutes as needed for chest pain. 25 tablet 12  . SYMBICORT 160-4.5 MCG/ACT inhaler Take 2 puffs by mouth 2 (two) times daily.  3  . amoxicillin (AMOXIL) 500 MG capsule Take 1 capsule (500 mg total) by mouth 3 (three) times daily. 21 capsule 0  . predniSONE (STERAPRED UNI-PAK 21 TAB) 10 MG (21) TBPK tablet Take 6 tabs by mouth day 1, 5 tabs day 2, 4 tabs day 3, 3 tabs day 4, 2 tabs day 5 and 1 tab day 6 21 tablet 0   No current facility-administered medications for this visit.      Physical Exam BP 130/84 (BP Location: Right Arm, Patient Position: Sitting, Cuff Size: Normal)   Pulse 73   Resp 16   Ht 5' 7"  (1.702 m)   Wt 162 lb (73.5 kg)   SpO2 93% Comment: RA  BMI 25.67 kg/m  62 year old woman in no acute distress Alert and oriented x3 with no focal deficits No cervical or supraclavicular adenopathy Cardiac regular rate and rhythm normal S1 and S2 Lungs wheezing bilaterally No clubbing cyanosis or edema  Diagnostic Tests: NUCLEAR MEDICINE PET SKULL BASE TO THIGH  TECHNIQUE: 8.4 mCi F-18 FDG was injected intravenously. Full-ring PET imaging was performed from the skull base to thigh after the radiotracer. CT data was obtained and used for attenuation correction and anatomic localization.  Fasting blood glucose: 115 mg/dl  COMPARISON:  Chest CT from 01/12/2018  FINDINGS: Mediastinal blood pool activity: SUV max 2.5  NECK: A focus of accentuated metabolic activity most closely correlating to a small deep parotid node on the right image 25/4 has maximum SUV of 4.3. The associated node measures about 0.6 by 1.1 cm on image 25/4.  Symmetric and likely physiologic activity along the palatine tonsils glottic structures.  Incidental CT findings: Mild chronic left maxillary sinusitis.  CHEST:  Focal ground-glass opacity in the superior segment left lower lobe, 1.0 by 0.8 cm on image 16/8, maximum SUV 0.8.  The indistinct approximately 1.7 by 1.0 cm ground-glass opacity further caudad in the superior segment left lower lobe on image 22/8 has a maximum SUV of 0.8.  The 1.4 by 0.9 cm right upper lobe ground-glass density nodule on image 25/8 has a maximum SUV of 1.3.  Anteriorly in the right upper lobe a 1.1 by 0.8 cm ground-glass density nodule on image 29/8 has a maximum SUV of 0.8.  Posteriorly in the left upper lobe, a 1.6 by 1.3 cm ground-glass density nodule on image 36/8 has a maximum SUV of 0.6.  Incidental CT  findings: Centrilobular emphysema. Coronary, aortic arch, and branch vessel atherosclerotic vascular disease.  ABDOMEN/PELVIS: Accentuated activity along the transverse abdominis muscles, right greater than left, without appreciable CT abnormality, likely physiologic.  Incidental CT findings: Cholecystectomy. Aortoiliac atherosclerotic vascular disease. Pelvic floor laxity.  SKELETON: No significant abnormal hypermetabolic activity in this region.  Incidental CT findings: none  IMPRESSION: 1. Five ground-glass opacity nodules all demonstrate low-grade activity. The highest activity is associated with the 1.4 by 0.9 cm right upper lobe nodule shown on image 25/8, but the maximum SUV of that nodule is only 1.3. Low-grade adenocarcinoma can  have a low maximum standard uptake value, and careful attention to and surveillance of morphologic changes in these nodules diagnostic CT scans should be ascribed high value in assessing for low-grade adenocarcinoma. 2. Small focus of accentuated activity along the deep portion of the right parotid gland has maximum SUV of 4.3, mildly elevated. This could be from the small 0.6 cm parotid lymph node in this vicinity, versus a small Warthin's tumor. 3. Other imaging findings of potential clinical significance: Aortic Atherosclerosis (ICD10-I70.0) and Emphysema (ICD10-J43.9). Coronary atherosclerosis. Pelvic floor laxity.   Electronically Signed   By: Van Clines M.D.   On: 02/04/2018 07:38 I personally reviewed the PET/CT images and concur with the findings noted above.  Pulmonary function testing FVC equals 2.04 (56%) FEV1 0.84 (30%) FEV1 0.87 (31%) postbronchodilator TLC 6.18 (112%) Residual volume 3.71 (168%) DLCO 14.56 (51%)  Impression: [name] is a 63 year old former smoker with a history of COPD who has multiple bilateral groundglass opacities.  A couple of these groundglass opacities do have small solid  components.  On PET CT these are all faintly active.  Findings are worrisome for multifocal low-grade adenocarcinomas.  AFB or fungal infections are also in the differential.  She needs a biopsy to guide therapy.  Options for biopsy include CT-guided biopsy, wedge resection, and navigational bronchoscopy.  I would favor navigational bronchoscopy which will allow [place] to sample both sides.  I think there is approximately an 80% chance will be able to get a definitive diagnosis.  I described the post procedure to Mrs. Jarnagin and her daughter.  They understand this is diagnostic and not therapeutic.  We will plan to do it in the operating room under general anesthesia on an outpatient basis.  I informed him of the indications, risk, benefits, and alternatives.  They understand the risks include, but not limited to those associated with general anesthesia including death, MI, and stroke as well as procedure specific risk such as bleeding, pneumothorax, and failure to make a diagnosis.  She accepts the risks and wishes to proceed.  Right parotid gland nodule-hypermetabolic on PET.  Will refer to ENT to see if any additional work-up is needed.  Plan: Navigational bronchoscopy on Thursday, 02/17/2018  Melrose Nakayama, MD [facility] [phone]

## 2018-02-09 ENCOUNTER — Other Ambulatory Visit: Payer: Self-pay | Admitting: *Deleted

## 2018-02-09 DIAGNOSIS — R918 Other nonspecific abnormal finding of lung field: Secondary | ICD-10-CM

## 2018-02-11 ENCOUNTER — Encounter: Payer: Self-pay | Admitting: *Deleted

## 2018-02-11 ENCOUNTER — Telehealth: Payer: Self-pay | Admitting: *Deleted

## 2018-02-11 MED ORDER — METOPROLOL TARTRATE 25 MG PO TABS: 25.0000 mg | ORAL_TABLET | Freq: Two times a day (BID) | ORAL | 3 refills | Status: DC

## 2018-02-11 NOTE — Telephone Encounter (Signed)
Pt notified.  She is agreeable with increasing metoprolol. Will send 90 day prescription to [facility] on [address]

## 2018-02-11 NOTE — Telephone Encounter (Signed)
-----   Message from [name], MD sent at 02/10/2018  1:49 PM EST ----- Her heart speeds up at times (short runs of SVT). She has early beats. I would reassure her and ask if she is ok increasing her metoprolol to 25 mg po BID

## 2018-02-15 NOTE — Pre-Procedure Instructions (Addendum)
[name]  02/15/2018      [facility] [place], [place] - [address] AT [place] [address] Phone: [phone] Fax: [phone]  [place] [address], [place] [address] Phone: [phone] Fax: [phone]  [facility], [place] - [place] [address] Phone: [phone] Fax: [phone]    Your procedure is scheduled on Thursday December 19th.  Report to [facility] Admitting at 6:00 A.M.  Call this number if you have problems the morning of surgery:  [phone]   Remember:  Do not eat or drink after midnight.    Take these medicines the morning of surgery with A SIP OF WATER  amLODipine (NORVASC) amoxicillin (AMOXIL)  buPROPion (WELLBUTRIN SR)  levothyroxine (SYNTHROID, LEVOTHROID) metoprolol tartrate (LOPRESSOR) SYMBICORT Inhaler guaifenesin (ROBITUSSIN)-if needed HYDROcodone-acetaminophen (NORCO)-if needed LORazepam (ATIVAN)-if needed albuterol (PROAIR HFA)-if needed *bring with you to the hospital  albuterol (PROVENTIL) (5 MG/ML) 0.5% nebulizer- if needed  nitroGLYCERIN (NITROSTAT) -if needed  Follow your surgeon's instructions on when to stop Asprin and clopidogrel (Plavix).  If no instructions were given by your surgeon then you will need to call the office to get those instructions.    7 days prior to surgery STOP taking any Aspirin(unless otherwise instructed by your surgeon), Aleve, Naproxen, Ibuprofen, Motrin, Advil, Goody's, BC's, all herbal medications, fish oil, and all vitamins   Do not wear jewelry, make-up or nail polish.  Do not wear lotions, powders, or perfumes, or deodorant.  Do not shave 48 hours prior to surgery.    Do not bring valuables to the hospital.  [facility] is not responsible for any belongings or  valuables.  Contacts, eyeglasses, hearing aids, dentures or bridgework may not be worn into surgery.  Leave your suitcase in the car.  After surgery it may be brought to your room.  For patients admitted to the hospital, discharge time will be determined by your treatment team.  Patients discharged the day of surgery will not be allowed to drive home.   [facility]- Preparing For Surgery  Before surgery, you can play an important role. Because skin is not sterile, your skin needs to be as free of germs as possible. You can reduce the number of germs on your skin by washing with CHG (chlorahexidine gluconate) Soap before surgery.  CHG is an antiseptic cleaner which kills germs and bonds with the skin to continue killing germs even after washing.    Oral Hygiene is also important to reduce your risk of infection.  Remember - BRUSH YOUR TEETH THE MORNING OF SURGERY WITH YOUR REGULAR TOOTHPASTE  Please do not use if you have an allergy to CHG or antibacterial soaps. If your skin becomes reddened/irritated stop using the CHG.  Do not shave (including legs and underarms) for at least 48 hours prior to first CHG shower. It is OK to shave your face.  Please follow these instructions carefully.   1. Shower the NIGHT BEFORE SURGERY and the MORNING OF SURGERY with CHG.   2. If you chose to wash your hair, wash your hair first as usual with your normal shampoo.  3. After you shampoo, rinse your hair and body thoroughly to remove the shampoo.  4. Use CHG as you would any other liquid soap. You  can apply CHG directly to the skin and wash gently with a scrungie or a clean washcloth.   5. Apply the CHG Soap to your body ONLY FROM THE NECK DOWN.  Do not use on open wounds or open sores. Avoid contact with your eyes, ears, mouth and genitals (private parts). Wash Face and genitals (private parts)  with your normal soap.  6. Wash thoroughly, paying special attention to the area where your surgery will be  performed.  7. Thoroughly rinse your body with warm water from the neck down.  8. DO NOT shower/wash with your normal soap after using and rinsing off the CHG Soap.  9. Pat yourself dry with a CLEAN TOWEL.  10. Wear CLEAN PAJAMAS to bed the night before surgery, wear comfortable clothes the morning of surgery  11. Place CLEAN SHEETS on your bed the night of your first shower and DO NOT SLEEP WITH PETS.    Day of Surgery: Shower as stated above. Do not apply any deodorants/lotions.  Please wear clean clothes to the hospital/surgery center.   Remember to brush your teeth WITH YOUR REGULAR TOOTHPASTE.   Please read over the following fact sheets that you were given.

## 2018-02-16 ENCOUNTER — Ambulatory Visit (HOSPITAL_COMMUNITY)
Admission: RE | Admit: 2018-02-16 | Discharge: 2018-02-16 | Disposition: A | Discharge status: Home / Self Care | Payer: Managed Care, Other (non HMO) | Source: Ambulatory Visit | Attending: Thoracic Surgery (Cardiothoracic Vascular Surgery) | Admitting: Thoracic Surgery (Cardiothoracic Vascular Surgery)

## 2018-02-16 ENCOUNTER — Encounter (HOSPITAL_COMMUNITY): Payer: Self-pay

## 2018-02-16 ENCOUNTER — Other Ambulatory Visit: Payer: Self-pay

## 2018-02-16 DIAGNOSIS — R918 Other nonspecific abnormal finding of lung field: Secondary | ICD-10-CM | POA: Insufficient documentation

## 2018-02-16 DIAGNOSIS — J449 Chronic obstructive pulmonary disease, unspecified: Secondary | ICD-10-CM | POA: Insufficient documentation

## 2018-02-16 LAB — APTT: aPTT: 27 seconds (ref 24–36)

## 2018-02-16 LAB — COMPREHENSIVE METABOLIC PANEL
ALT: 55 U/L — ABNORMAL HIGH (ref 0–44)
AST: 42 U/L — AB (ref 15–41)
Albumin: 3.8 g/dL (ref 3.5–5.0)
Alkaline Phosphatase: 80 U/L (ref 38–126)
Anion gap: 10 (ref 5–15)
BUN: 17 mg/dL (ref 8–23)
CO2: 29 mmol/L (ref 22–32)
Calcium: 9.1 mg/dL (ref 8.9–10.3)
Chloride: 102 mmol/L (ref 98–111)
Creatinine, Ser: 0.78 mg/dL (ref 0.44–1.00)
GFR calc Af Amer: >60 mL/min (ref >60)
GFR calc non Af Amer: >60 mL/min (ref >60)
Glucose, Bld: 119 mg/dL — ABNORMAL HIGH (ref 70–99)
POTASSIUM: 3.6 mmol/L (ref 3.5–5.1)
Sodium: 141 mmol/L (ref 135–145)
Total Bilirubin: 0.8 mg/dL (ref 0.3–1.2)
Total Protein: 6.5 g/dL (ref 6.5–8.1)

## 2018-02-16 LAB — CBC
HCT: 40.9 % (ref 36.0–46.0)
Hemoglobin: 12.8 g/dL (ref 12.0–15.0)
MCH: 29.1 pg (ref 26.0–34.0)
MCHC: 31.3 g/dL (ref 30.0–36.0)
MCV: 93 fL (ref 80.0–100.0)
Platelets: 247 10*3/uL (ref 150–400)
RBC: 4.4 MIL/uL (ref 3.87–5.11)
RDW: 14 % (ref 11.5–15.5)
WBC: 10 10*3/uL (ref 4.0–10.5)
nRBC: 0 % (ref 0.0–0.2)

## 2018-02-16 LAB — PROTIME-INR
INR: 0.88
Prothrombin Time: 11.9 seconds (ref 11.4–15.2)

## 2018-02-16 NOTE — Pre-Procedure Instructions (Signed)
[name]  02/16/2018        Your procedure is scheduled on Thursday December 19th.  Report to [facility] Admitting at 6:00 A.M.  Call this number if you have problems the morning of surgery:  [phone]   Remember:  Do not eat or drink after midnight.    Take these medicines the morning of surgery with A SIP OF WATER:  amLODipine (NORVASC) amoxicillin (AMOXIL)  buPROPion (WELLBUTRIN SR)  levothyroxine (SYNTHROID, LEVOTHROID) metoprolol tartrate (LOPRESSOR) SYMBICORT Inhaler HYDROcodone-acetaminophen (NORCO)-if needed LORazepam (ATIVAN)-if needed albuterol (PROAIR HFA)-if needed *bring with you to the hospital  albuterol (PROVENTIL) (5 MG/ML) 0.5% nebulizer- if needed  nitroGLYCERIN (NITROSTAT) -if needed  Follow your surgeon's instructions regarding your Asprin and clopidogrel (Plavix).  If no instructions were given by your surgeon then you will need to call the office to get those instructions.    7 days prior to surgery STOP taking any Aspirin(unless otherwise instructed by your surgeon), Aleve, Naproxen, Ibuprofen, Motrin, Advil, Goody's, BC's, all herbal medications, fish oil, and all vitamins.   Do not wear jewelry, make-up or nail polish.  Do not wear lotions, powders, or perfumes, or deodorant.  Do not shave 48 hours prior to surgery.    Do not bring valuables to the hospital.  [facility] is not responsible for any belongings or valuables.  Contacts, eyeglasses, hearing aids, dentures or bridgework may not be worn into surgery.  Leave your suitcase in the car.  After surgery it may be brought to your room.  For patients admitted to the hospital, discharge time will be determined by your treatment team.  Patients discharged the day of surgery will not be allowed to drive home.   [facility]- Preparing For Surgery  Before surgery, you can play an important role. Because skin is not sterile, your skin needs to be as free of germs as possible.  You can reduce the number of germs on your skin by washing with CHG (chlorahexidine gluconate) Soap before surgery.  CHG is an antiseptic cleaner which kills germs and bonds with the skin to continue killing germs even after washing.    Oral Hygiene is also important to reduce your risk of infection.  Remember - BRUSH YOUR TEETH THE MORNING OF SURGERY WITH YOUR REGULAR TOOTHPASTE  Please do not use if you have an allergy to CHG or antibacterial soaps. If your skin becomes reddened/irritated stop using the CHG.  Do not shave (including legs and underarms) for at least 48 hours prior to first CHG shower. It is OK to shave your face.  Please follow these instructions carefully.   1. Shower the NIGHT BEFORE SURGERY and the MORNING OF SURGERY with CHG.   2. If you chose to wash your hair, wash your hair first as usual with your normal shampoo.  3. After you shampoo, rinse your hair and body thoroughly to remove the shampoo.  4. Use CHG as you would any other liquid soap. You can apply CHG directly to the skin and wash gently with a scrungie or a clean washcloth.   5. Apply the CHG Soap to your body ONLY FROM THE NECK DOWN.  Do not use on open wounds or open sores. Avoid contact with your eyes, ears, mouth and genitals (private parts). Wash Face and genitals (private parts)  with your normal soap.  6. Wash thoroughly, paying special attention to the area where your surgery will be performed.  7. Thoroughly rinse your body with  warm water from the neck down.  8. DO NOT shower/wash with your normal soap after using and rinsing off the CHG Soap.  9. Pat yourself dry with a CLEAN TOWEL.  10. Wear CLEAN PAJAMAS to bed the night before surgery, wear comfortable clothes the morning of surgery  11. Place CLEAN SHEETS on your bed the night of your first shower and DO NOT SLEEP WITH PETS.    Day of Surgery: Shower as stated above. Do not apply any deodorants/lotions.  Please wear clean clothes to  the hospital/surgery center.   Remember to brush your teeth WITH YOUR REGULAR TOOTHPASTE.   Please read over the following fact sheets that you were given.

## 2018-02-16 NOTE — Progress Notes (Addendum)
PCP: [name] NP Cardiologist: Dr. Julianne Handler  EKG: 01-10-18 CXR: 02-16-2018 ECHO: 10-19-2016 Stress Test: 01-20-18 Cardiac Cath: 08/2015  Patient denies shortness of breath, fever, cough, and chest pain at PAT appointment.  Patient verbalized understanding of instructions provided today at the PAT appointment.  Patient asked to review instructions at home and day of surgery.   Pt's procedure tomorrow.  Called Jeneen Rinks PA-C with anesthesia for review.   Pt reports MD instructed to stop ASA and Plavix 4 days prior to procedure, but pt forgot to take those meds on a recent trip, so she stopped them 6 days ago.

## 2018-02-16 NOTE — Anesthesia Preprocedure Evaluation (Addendum)
Anesthesia Evaluation  Patient identified by MRN, date of birth, ID band Patient awake    Reviewed: Allergy & Precautions, NPO status , Patient's Chart, lab work & pertinent test results, reviewed documented beta blocker date and time   History of Anesthesia Complications (+) PONV and history of anesthetic complications  Airway Mallampati: II  TM Distance: >3 FB Neck ROM: Full    Dental  (+) Dental Advisory Given   Pulmonary shortness of breath, asthma , former smoker,     + decreased breath sounds      Cardiovascular hypertension, Pt. on medications and Pt. on home beta blockers (-) angina+ CAD and + Cardiac Stents   Rhythm:Regular     Neuro/Psych  Headaches, PSYCHIATRIC DISORDERS Depression  Neuromuscular disease    GI/Hepatic hiatal hernia, GERD  ,  Endo/Other  negative endocrine ROS  Renal/GU negative Renal ROS     Musculoskeletal  (+) Arthritis , Fibromyalgia -  Abdominal   Peds  Hematology  (+) anemia ,   Anesthesia Other Findings 2019:   Nuclear stress EF: 59%.  There was no ST segment deviation noted during stress.  No T wave inversion was noted during stress.  The study is normal.  This is a low risk study.  . s/p Xience DES to [facility] 04/2008;  b. LHC 05/2008: Proximal RCA 25%, mid RCA stent patent.  c. Anormal nuc 2014 -> s/p LHC with severe mRCA stenosis s/p DES. d. Cath 04/2013 s/p DES to LAD.  Reproductive/Obstetrics                           Anesthesia Physical Anesthesia Plan  ASA: IV  Anesthesia Plan: General   Post-op Pain Management:    Induction: Intravenous  PONV Risk Score and Plan: 4 or greater and Ondansetron, Dexamethasone and Scopolamine patch - Pre-op  Airway Management Planned: Oral ETT  Additional Equipment: None  Intra-op Plan:   Post-operative Plan: Extubation in OR  Informed Consent: I have reviewed the patients History and Physical, chart,  labs and discussed the procedure including the risks, benefits and alternatives for the proposed anesthesia with the patient or authorized representative who has indicated his/her understanding and acceptance.   Dental advisory given  Plan Discussed with: CRNA and Surgeon  Anesthesia Plan Comments: (Pt follows with cardiology for hx of multiple prior stents to LAD and RCA, history of PVCs, PACs, SVT. Lexiscan 01/20/18 was Low risk with normal perfusion and normal left ventricular regional and global systolic function, EF 16%. See OV note by Lyda Jester, PA-C 01/10/18 for full cardiac history.)       Anesthesia Quick Evaluation

## 2018-02-17 ENCOUNTER — Encounter (HOSPITAL_COMMUNITY)
Admission: RE | Disposition: A | Discharge status: Home / Self Care | Payer: Self-pay | Source: Home / Self Care | Attending: Thoracic Surgery (Cardiothoracic Vascular Surgery)

## 2018-02-17 ENCOUNTER — Ambulatory Visit (HOSPITAL_COMMUNITY)
Admission: RE | Admit: 2018-02-17 | Discharge: 2018-02-17 | Disposition: A | Discharge status: Home / Self Care | Payer: Managed Care, Other (non HMO) | Attending: Thoracic Surgery (Cardiothoracic Vascular Surgery) | Admitting: Thoracic Surgery (Cardiothoracic Vascular Surgery)

## 2018-02-17 ENCOUNTER — Ambulatory Visit (HOSPITAL_COMMUNITY): Payer: Managed Care, Other (non HMO) | Admitting: Physician Assistant

## 2018-02-17 ENCOUNTER — Ambulatory Visit (HOSPITAL_COMMUNITY): Payer: Managed Care, Other (non HMO)

## 2018-02-17 ENCOUNTER — Encounter (HOSPITAL_COMMUNITY): Payer: Self-pay

## 2018-02-17 ENCOUNTER — Ambulatory Visit (HOSPITAL_COMMUNITY): Payer: Managed Care, Other (non HMO) | Admitting: Certified Registered Nurse Anesthetist

## 2018-02-17 DIAGNOSIS — Z7902 Long term (current) use of antithrombotics/antiplatelets: Secondary | ICD-10-CM | POA: Insufficient documentation

## 2018-02-17 DIAGNOSIS — J449 Chronic obstructive pulmonary disease, unspecified: Secondary | ICD-10-CM | POA: Diagnosis not present

## 2018-02-17 DIAGNOSIS — Z7982 Long term (current) use of aspirin: Secondary | ICD-10-CM | POA: Insufficient documentation

## 2018-02-17 DIAGNOSIS — Z87891 Personal history of nicotine dependence: Secondary | ICD-10-CM | POA: Insufficient documentation

## 2018-02-17 DIAGNOSIS — I10 Essential (primary) hypertension: Secondary | ICD-10-CM | POA: Insufficient documentation

## 2018-02-17 DIAGNOSIS — Z79890 Hormone replacement therapy (postmenopausal): Secondary | ICD-10-CM | POA: Diagnosis not present

## 2018-02-17 DIAGNOSIS — Z9889 Other specified postprocedural states: Secondary | ICD-10-CM

## 2018-02-17 DIAGNOSIS — C3412 Malignant neoplasm of upper lobe, left bronchus or lung: Secondary | ICD-10-CM | POA: Insufficient documentation

## 2018-02-17 DIAGNOSIS — Z7951 Long term (current) use of inhaled steroids: Secondary | ICD-10-CM | POA: Insufficient documentation

## 2018-02-17 DIAGNOSIS — Z955 Presence of coronary angioplasty implant and graft: Secondary | ICD-10-CM | POA: Insufficient documentation

## 2018-02-17 DIAGNOSIS — R918 Other nonspecific abnormal finding of lung field: Secondary | ICD-10-CM

## 2018-02-17 DIAGNOSIS — I2510 Atherosclerotic heart disease of native coronary artery without angina pectoris: Secondary | ICD-10-CM | POA: Diagnosis not present

## 2018-02-17 DIAGNOSIS — F329 Major depressive disorder, single episode, unspecified: Secondary | ICD-10-CM | POA: Insufficient documentation

## 2018-02-17 DIAGNOSIS — Z419 Encounter for procedure for purposes other than remedying health state, unspecified: Secondary | ICD-10-CM

## 2018-02-17 DIAGNOSIS — Z79899 Other long term (current) drug therapy: Secondary | ICD-10-CM | POA: Diagnosis not present

## 2018-02-17 DIAGNOSIS — C3411 Malignant neoplasm of upper lobe, right bronchus or lung: Secondary | ICD-10-CM | POA: Diagnosis not present

## 2018-02-17 DIAGNOSIS — R911 Solitary pulmonary nodule: Secondary | ICD-10-CM | POA: Diagnosis not present

## 2018-02-17 HISTORY — PX: VIDEO BRONCHOSCOPY WITH ENDOBRONCHIAL NAVIGATION: SHX6175

## 2018-02-17 HISTORY — DX: Other specified postprocedural states: Z98.890

## 2018-02-17 HISTORY — DX: Other nonspecific abnormal finding of lung field: R91.8

## 2018-02-17 HISTORY — DX: Other specified postprocedural states: R11.2

## 2018-02-17 SURGERY — VIDEO BRONCHOSCOPY WITH ENDOBRONCHIAL NAVIGATION
Anesthesia: General

## 2018-02-17 MED ORDER — PHENYLEPHRINE 40 MCG/ML (10ML) SYRINGE FOR IV PUSH (FOR BLOOD PRESSURE SUPPORT)
PREFILLED_SYRINGE | INTRAVENOUS | Status: DC | PRN
  Administered 2018-02-17 (×3): 80 ug via INTRAVENOUS

## 2018-02-17 MED ORDER — DEXAMETHASONE SODIUM PHOSPHATE 10 MG/ML IJ SOLN
INTRAMUSCULAR | Status: DC | PRN
  Administered 2018-02-17: 10 mg via INTRAVENOUS

## 2018-02-17 MED ORDER — PROPOFOL 10 MG/ML IV BOLUS
INTRAVENOUS | Status: AC
  Filled 2018-02-17: qty 40

## 2018-02-17 MED ORDER — FENTANYL CITRATE (PF) 250 MCG/5ML IJ SOLN
INTRAMUSCULAR | Status: AC
  Filled 2018-02-17: qty 5

## 2018-02-17 MED ORDER — LIDOCAINE 2% (20 MG/ML) 5 ML SYRINGE
INTRAMUSCULAR | Status: AC
  Filled 2018-02-17: qty 5

## 2018-02-17 MED ORDER — PROPOFOL 10 MG/ML IV BOLUS
INTRAVENOUS | Status: DC | PRN
  Administered 2018-02-17: 20 mg via INTRAVENOUS
  Administered 2018-02-17: 80 mg via INTRAVENOUS

## 2018-02-17 MED ORDER — PHENYLEPHRINE HCL 10 MG/ML IJ SOLN: INTRAMUSCULAR | Status: DC | PRN

## 2018-02-17 MED ORDER — SCOPOLAMINE 1 MG/3DAYS TD PT72
MEDICATED_PATCH | TRANSDERMAL | Status: DC | PRN
  Administered 2018-02-17: 1 via TRANSDERMAL

## 2018-02-17 MED ORDER — ROCURONIUM BROMIDE 10 MG/ML (PF) SYRINGE
PREFILLED_SYRINGE | INTRAVENOUS | Status: DC | PRN
  Administered 2018-02-17: 50 mg via INTRAVENOUS
  Administered 2018-02-17: 30 mg via INTRAVENOUS

## 2018-02-17 MED ORDER — DEXAMETHASONE SODIUM PHOSPHATE 10 MG/ML IJ SOLN
INTRAMUSCULAR | Status: AC
  Filled 2018-02-17: qty 1

## 2018-02-17 MED ORDER — SCOPOLAMINE 1 MG/3DAYS TD PT72
MEDICATED_PATCH | TRANSDERMAL | Status: AC
  Filled 2018-02-17: qty 1

## 2018-02-17 MED ORDER — ONDANSETRON HCL 4 MG/2ML IJ SOLN
INTRAMUSCULAR | Status: DC | PRN
  Administered 2018-02-17: 4 mg via INTRAVENOUS

## 2018-02-17 MED ORDER — EPINEPHRINE PF 1 MG/ML IJ SOLN
INTRAMUSCULAR | Status: AC
  Filled 2018-02-17: qty 1

## 2018-02-17 MED ORDER — EPHEDRINE SULFATE-NACL 50-0.9 MG/10ML-% IV SOSY
PREFILLED_SYRINGE | INTRAVENOUS | Status: DC | PRN
  Administered 2018-02-17: 10 mg via INTRAVENOUS
  Administered 2018-02-17: 15 mg via INTRAVENOUS
  Administered 2018-02-17 (×2): 10 mg via INTRAVENOUS

## 2018-02-17 MED ORDER — 0.9 % SODIUM CHLORIDE (POUR BTL) OPTIME
TOPICAL | Status: DC | PRN
  Administered 2018-02-17: 1000 mL

## 2018-02-17 MED ORDER — ROCURONIUM BROMIDE 50 MG/5ML IV SOSY
PREFILLED_SYRINGE | INTRAVENOUS | Status: AC
  Filled 2018-02-17: qty 10

## 2018-02-17 MED ORDER — MIDAZOLAM HCL 2 MG/2ML IJ SOLN
INTRAMUSCULAR | Status: AC
  Filled 2018-02-17: qty 2

## 2018-02-17 MED ORDER — LACTATED RINGERS IV SOLN
INTRAVENOUS | Status: DC
  Administered 2018-02-17: 07:00:00 via INTRAVENOUS

## 2018-02-17 MED ORDER — SODIUM CHLORIDE 0.9 % IV SOLN
INTRAVENOUS | Status: DC | PRN
  Administered 2018-02-17 (×2): 25 ug/min via INTRAVENOUS

## 2018-02-17 MED ORDER — ONDANSETRON HCL 4 MG/2ML IJ SOLN
INTRAMUSCULAR | Status: AC
  Filled 2018-02-17: qty 2

## 2018-02-17 MED ORDER — MIDAZOLAM HCL 5 MG/5ML IJ SOLN
INTRAMUSCULAR | Status: DC | PRN
  Administered 2018-02-17: 2 mg via INTRAVENOUS

## 2018-02-17 MED ORDER — LIDOCAINE 2% (20 MG/ML) 5 ML SYRINGE
INTRAMUSCULAR | Status: DC | PRN
  Administered 2018-02-17: 60 mg via INTRAVENOUS

## 2018-02-17 MED ORDER — FENTANYL CITRATE (PF) 250 MCG/5ML IJ SOLN
INTRAMUSCULAR | Status: DC | PRN
  Administered 2018-02-17: 100 ug via INTRAVENOUS

## 2018-02-17 MED ORDER — SUGAMMADEX SODIUM 200 MG/2ML IV SOLN
INTRAVENOUS | Status: DC | PRN
  Administered 2018-02-17: 294 mg via INTRAVENOUS

## 2018-02-17 SURGICAL SUPPLY — 47 items
ADAPTER BRONCHOSCOPE OLYMPUS (ADAPTER) ×2 IMPLANT
ADAPTER VALVE BIOPSY EBUS (MISCELLANEOUS) IMPLANT
ADPR BSCP OLMPS EDG (ADAPTER) ×1
ADPTR VALVE BIOPSY EBUS (MISCELLANEOUS)
BRUSH BIOPSY BRONCH 10 SDTNB (MISCELLANEOUS) IMPLANT
BRUSH SUPERTRAX BIOPSY (INSTRUMENTS) IMPLANT
BRUSH SUPERTRAX NDL-TIP CYTO (INSTRUMENTS) ×3 IMPLANT
CANISTER SUCT 3000ML PPV (MISCELLANEOUS) ×2 IMPLANT
CHANNEL WORK EXTEND EDGE 180 (KITS) IMPLANT
CHANNEL WORK EXTEND EDGE 90 (KITS) IMPLANT
CONT SPEC 4OZ CLIKSEAL STRL BL (MISCELLANEOUS) ×4 IMPLANT
CONT SPECI 4OZ STER CLIK (MISCELLANEOUS) ×1 IMPLANT
COVER BACK TABLE 60X90IN (DRAPES) ×2 IMPLANT
COVER WAND RF STERILE (DRAPES) ×2 IMPLANT
FILTER STRAW FLUID ASPIR (MISCELLANEOUS) IMPLANT
FORCEPS BIOP SUPERTRX PREMAR (INSTRUMENTS) ×1 IMPLANT
GAUZE SPONGE 4X4 12PLY STRL (GAUZE/BANDAGES/DRESSINGS) ×2 IMPLANT
GLOVE BIOGEL PI IND STRL 8 (GLOVE) IMPLANT
GLOVE BIOGEL PI INDICATOR 8 (GLOVE) ×1
GLOVE SURG SIGNA 7.5 PF LTX (GLOVE) ×2 IMPLANT
GOWN STRL REUS W/ TWL XL LVL3 (GOWN DISPOSABLE) ×1 IMPLANT
GOWN STRL REUS W/TWL XL LVL3 (GOWN DISPOSABLE) ×2
KIT CLEAN ENDO COMPLIANCE (KITS) ×2 IMPLANT
KIT PROCEDURE EDGE 180 (KITS) IMPLANT
KIT PROCEDURE EDGE 90 (KITS) IMPLANT
KIT TURNOVER KIT B (KITS) ×2 IMPLANT
MARKER SKIN DUAL TIP RULER LAB (MISCELLANEOUS) ×2 IMPLANT
NDL SUPERTRX PREMARK BIOPSY (NEEDLE) IMPLANT
NEEDLE SUPERTRX PREMARK BIOPSY (NEEDLE) ×4 IMPLANT
NS IRRIG 1000ML POUR BTL (IV SOLUTION) ×2 IMPLANT
OIL SILICONE PENTAX (PARTS (SERVICE/REPAIRS)) ×2 IMPLANT
PAD ARMBOARD 7.5X6 YLW CONV (MISCELLANEOUS) ×4 IMPLANT
PATCHES PATIENT (LABEL) ×6 IMPLANT
RUBBERBAND STERILE (MISCELLANEOUS) ×1 IMPLANT
SYR 20CC LL (SYRINGE) ×2 IMPLANT
SYR 20ML ECCENTRIC (SYRINGE) ×2 IMPLANT
SYR 30ML LL (SYRINGE) ×2 IMPLANT
SYR 5ML LL (SYRINGE) ×2 IMPLANT
TOWEL GREEN STERILE (TOWEL DISPOSABLE) ×2 IMPLANT
TOWEL GREEN STERILE FF (TOWEL DISPOSABLE) ×2 IMPLANT
TRAP SPECIMEN MUCOUS 40CC (MISCELLANEOUS) ×2 IMPLANT
TUBE CONNECTING 20X1/4 (TUBING) ×4 IMPLANT
UNDERPAD 30X30 (UNDERPADS AND DIAPERS) ×2 IMPLANT
VALVE BIOPSY  SINGLE USE (MISCELLANEOUS) ×2
VALVE BIOPSY SINGLE USE (MISCELLANEOUS) ×1 IMPLANT
VALVE SUCTION BRONCHIO DISP (MISCELLANEOUS) ×2 IMPLANT
WATER STERILE IRR 1000ML POUR (IV SOLUTION) ×2 IMPLANT

## 2018-02-17 NOTE — Anesthesia Procedure Notes (Signed)
Procedure Name: Intubation Date/Time: 02/17/2018 8:22 AM Performed by: Colin Benton, CRNA Pre-anesthesia Checklist: Patient identified, Emergency Drugs available, Suction available and Patient being monitored Patient Re-evaluated:Patient Re-evaluated prior to induction Oxygen Delivery Method: Circle system utilized Preoxygenation: Pre-oxygenation with 100% oxygen Induction Type: IV induction Ventilation: Mask ventilation without difficulty and Oral airway inserted - appropriate to patient size Laryngoscope Size: Sabra Heck and 3 Grade View: Grade I Tube type: Oral Tube size: 8.5 mm Number of attempts: 1 Airway Equipment and Method: Stylet Placement Confirmation: ETT inserted through vocal cords under direct vision,  positive ETCO2 and breath sounds checked- equal and bilateral Secured at: 23 cm Tube secured with: Tape Dental Injury: Teeth and Oropharynx as per pre-operative assessment

## 2018-02-17 NOTE — Op Note (Signed)
NAME: [name], [name] MEDICAL RECORD ON:[id number] ACCOUNT [id number] DATE OF BIRTH:05-14-1955 FACILITY: [facility] LOCATION: [facility] PHYSICIAN:Rashaud Ybarbo C. Marimar Suber, MD  OPERATIVE REPORT  DATE OF PROCEDURE:  02/17/2018  PREOPERATIVE DIAGNOSIS:  Bilateral lung nodules.  POSTOPERATIVE DIAGNOSIS:  Bilateral lung nodules, multifocal adenocarcinoma.  PROCEDURE:  Electromagnetic navigational bronchoscopy with needle aspirations, brushings and transbronchial biopsies.  SURGEON:  Modesto Charon, MD  ASSISTANT:  None.  ANESTHESIA:  General.  FINDINGS:  Initial needle aspirations and brushings from right upper lobe nodule showed adenocarcinoma.  Biopsies from left upper lobe nodule suspicious for adenocarcinoma.  CLINICAL NOTE:  [name] is a 62 year old woman with a history of tobacco abuse who recently was being evaluated for dyspnea.  A CT angiogram showed no evidence of pulmonary emboli, but she was found to have multiple ground-glass opacities  bilaterally.  On PET CT, these lesions were mildly hypermetabolic and highly suspicious for multifocal low grade adenocarcinoma.  AFB and fungal infections are in the differential.  She was advised to undergo navigational bronchoscopy for biopsy for  diagnostic purposes.  The indications, risks, benefits, and alternatives were discussed in detail with the patient.  She understood and accepted the risks and agreed to proceed.  DESCRIPTION OF PROCEDURE:  The patient was brought to the operating room on 02/17/2018.  Mapping of the nodules and preoperative planning was done prior to the patient's arrival.  On arrival, she was anesthetized and intubated.  A timeout was performed.   Flexible fiberoptic bronchoscopy was performed via the endotracheal tube.  It revealed normal endobronchial anatomy with no endobronchial lesions to the level of the subsegmental bronchi.  There were thick clear secretions, which were cleared with  saline irrigation.  The  locatable guide for navigation was placed and registration was performed.  The right upper lobe target was selected for biopsy.  The locatable guide was advanced to within 8 mm of the nodule with good alignment.  Sampling then was  performed.  All sampling was done under fluoroscopy.  Needle aspirations were performed first, followed by needle brushings.  These were both placed onto slides and evaluated. While awaiting those results, multiple biopsies were obtained.  The locatable  guide was reinserted periodically and the catheter was repositioned as needed to maintain good proximity and alignment with the nodule.  The specimens were determined to be adequate and were sent to the pathologist for examination.  Multiple additional  biopsies were obtained and these were all sent for permanent pathology.  The lesion was determined to be nonsmall cell carcinoma, probably adenocarcinoma.  There was no good airway to the superior segment of the lower lobe lesion.  Therefore, the left upper lobe lesion was selected as the next target.  The locatable guide was replaced.  The bronchoscope was advanced to the left upper lobe bronchus and the  appropriate subsegmental bronchus was cannulated.  The locatable guide was advanced to within 6 mm of the center of the nodule with good alignment.  The sampling process was repeated.  The brushings were suspicious, but not definitively diagnostic.   Multiple biopsies were obtained from that site as well.  Given that we had a definitive diagnosis with the right upper lobe lesion was not felt necessary to do any additional biopsies.  Final inspection was made with the bronchoscope.  There was no  ongoing bleeding.  The patient was then extubated in the operating room and taken to the [facility] in good condition.  TN/NUANCE  D:02/17/2018 T:02/17/2018 JOB:[id number]

## 2018-02-17 NOTE — Discharge Instructions (Addendum)
Do not drive or engage in heavy physical activity for 24 hours  You may resume normal activities tomorrow  You may cough up small amounts of blood over the next few days  You may use acetaminophen (Tylenol) if needed for discomfort  Call [phone] if you develop chest pain, shortness of breath, fever > 101 F or cough up more than 2 tablespoons of blood  My office will arrange for an appointment in multidisciplinary thoracic oncology clinic

## 2018-02-17 NOTE — Transfer of Care (Signed)
Immediate Anesthesia Transfer of Care Note  Patient: EULALIA ELLERMAN  Procedure(s) Performed: VIDEO BRONCHOSCOPY WITH ENDOBRONCHIAL NAVIGATION (N/A )  Patient Location: PACU  Anesthesia Type:General  Level of Consciousness: drowsy  Airway & Oxygen Therapy: Patient Spontanous Breathing and Patient connected to face mask oxygen  Post-op Assessment: Report given to RN and Post -op Vital signs reviewed and stable  Post vital signs: Reviewed and stable  Last Vitals:  Vitals Value Taken Time  BP 109/62 02/17/2018  9:46 AM  Temp 36.1 C 02/17/2018  9:46 AM  Pulse 66 02/17/2018  9:47 AM  Resp 16 02/17/2018  9:47 AM  SpO2 99 % 02/17/2018  9:47 AM  Vitals shown include unvalidated device data.  Last Pain:  Vitals:   02/17/18 0946  TempSrc:   PainSc: (P) Asleep         Complications: No apparent anesthesia complications

## 2018-02-17 NOTE — Brief Op Note (Signed)
02/17/2018  9:40 AM  PATIENT:  [name]  62 y.o. female  PRE-OPERATIVE DIAGNOSIS:  BILATERAL LUNG NODULES  POST-OPERATIVE DIAGNOSIS:  BILATERAL LUNG NODULES- MULTIFOCAL ADENOCARCINOMA  PROCEDURE:  Procedure(s): VIDEO BRONCHOSCOPY WITH ENDOBRONCHIAL NAVIGATION (N/A) With needle aspirations, brushings and transbronchial biopsies  SURGEON:  Surgeon(s) and Role:    * Melrose Nakayama, MD - Primary  PHYSICIAN ASSISTANT:   ASSISTANTS: none   ANESTHESIA:   general  EBL:  5 mL   BLOOD ADMINISTERED:none  DRAINS: none   LOCAL MEDICATIONS USED:  NONE  SPECIMEN:  Source of Specimen:  RUL, LUL nodules  DISPOSITION OF SPECIMEN:  PATHOLOGY  COUNTS:  NO endoscopic  TOURNIQUET:  * No tourniquets in log *  DICTATION: .Other Dictation: Dictation Number -  PLAN OF CARE: Discharge to home after PACU  PATIENT DISPOSITION:  PACU - hemodynamically stable.   Delay start of Pharmacological VTE agent (>24hrs) due to surgical blood loss or risk of bleeding: not applicable

## 2018-02-17 NOTE — Interval H&P Note (Signed)
History and Physical Interval Note:  02/17/2018 7:57 AM  [name]  has presented today for surgery, with the diagnosis of BILATERAL LUNG NODULES  The various methods of treatment have been discussed with the patient and family. After consideration of risks, benefits and other options for treatment, the patient has consented to  Procedure(s): [place] (N/A) as a surgical intervention .  The patient's history has been reviewed, patient examined, no change in status, stable for surgery.  I have reviewed the patient's chart and labs.  Questions were answered to the patient's satisfaction.     Melrose Nakayama

## 2018-02-18 ENCOUNTER — Telehealth: Payer: Self-pay | Admitting: *Deleted

## 2018-02-18 ENCOUNTER — Encounter (HOSPITAL_COMMUNITY): Payer: Self-pay | Admitting: Thoracic Surgery (Cardiothoracic Vascular Surgery)

## 2018-02-18 DIAGNOSIS — R918 Other nonspecific abnormal finding of lung field: Secondary | ICD-10-CM

## 2018-02-18 NOTE — Telephone Encounter (Signed)
Oncology Nurse Navigator Documentation  Oncology Nurse Navigator Flowsheets 02/18/2018  Navigator Location [facility]  Referral date to RadOnc/MedOnc 02/17/2018  Navigator Encounter Type Telephone/I received referral from Dr. Leonarda Salon office yesterday.  I called [name] today and scheduled her for thoracic clinic on 03/03/18. She verbalized understanding of appt time and place.   Telephone Outgoing Call  Treatment Phase Pre-Tx/Tx Discussion  Barriers/Navigation Needs Education;Coordination of Care  Education Other  Interventions Coordination of Care;Education  Coordination of Care Appts  Education Method Verbal  Acuity Level 2  Time Spent with Patient 30

## 2018-02-22 ENCOUNTER — Ambulatory Visit: Payer: Managed Care, Other (non HMO) | Admitting: Physician Assistant

## 2018-02-25 ENCOUNTER — Telehealth: Payer: Self-pay | Admitting: Pharmacist

## 2018-02-25 NOTE — Telephone Encounter (Signed)
LMOM for pt to see if she would like to start the process for [employer] coverage.

## 2018-03-03 ENCOUNTER — Telehealth: Payer: Self-pay

## 2018-03-03 ENCOUNTER — Inpatient Hospital Stay: Payer: Managed Care, Other (non HMO) | Attending: Internal Medicine

## 2018-03-03 ENCOUNTER — Encounter: Payer: Self-pay | Admitting: *Deleted

## 2018-03-03 ENCOUNTER — Inpatient Hospital Stay (HOSPITAL_BASED_OUTPATIENT_CLINIC_OR_DEPARTMENT_OTHER): Payer: Managed Care, Other (non HMO) | Admitting: Internal Medicine

## 2018-03-03 ENCOUNTER — Other Ambulatory Visit: Payer: Self-pay | Admitting: *Deleted

## 2018-03-03 DIAGNOSIS — M797 Fibromyalgia: Secondary | ICD-10-CM

## 2018-03-03 DIAGNOSIS — Z800 Family history of malignant neoplasm of digestive organs: Secondary | ICD-10-CM | POA: Diagnosis not present

## 2018-03-03 DIAGNOSIS — I10 Essential (primary) hypertension: Secondary | ICD-10-CM | POA: Diagnosis not present

## 2018-03-03 DIAGNOSIS — J449 Chronic obstructive pulmonary disease, unspecified: Secondary | ICD-10-CM

## 2018-03-03 DIAGNOSIS — M1990 Unspecified osteoarthritis, unspecified site: Secondary | ICD-10-CM | POA: Insufficient documentation

## 2018-03-03 DIAGNOSIS — R197 Diarrhea, unspecified: Secondary | ICD-10-CM | POA: Diagnosis not present

## 2018-03-03 DIAGNOSIS — R110 Nausea: Secondary | ICD-10-CM | POA: Diagnosis not present

## 2018-03-03 DIAGNOSIS — E785 Hyperlipidemia, unspecified: Secondary | ICD-10-CM | POA: Diagnosis not present

## 2018-03-03 DIAGNOSIS — Z79899 Other long term (current) drug therapy: Secondary | ICD-10-CM

## 2018-03-03 DIAGNOSIS — F17210 Nicotine dependence, cigarettes, uncomplicated: Secondary | ICD-10-CM

## 2018-03-03 DIAGNOSIS — I2510 Atherosclerotic heart disease of native coronary artery without angina pectoris: Secondary | ICD-10-CM

## 2018-03-03 DIAGNOSIS — C3490 Malignant neoplasm of unspecified part of unspecified bronchus or lung: Secondary | ICD-10-CM

## 2018-03-03 DIAGNOSIS — Z7982 Long term (current) use of aspirin: Secondary | ICD-10-CM | POA: Insufficient documentation

## 2018-03-03 DIAGNOSIS — K219 Gastro-esophageal reflux disease without esophagitis: Secondary | ICD-10-CM | POA: Insufficient documentation

## 2018-03-03 DIAGNOSIS — Z716 Tobacco abuse counseling: Secondary | ICD-10-CM

## 2018-03-03 NOTE — Progress Notes (Signed)
Oncology Nurse Navigator Documentation  Oncology Nurse Navigator Flowsheets 03/03/2018  Navigator Location [facility]  Navigator Encounter Type Other/per Dr. Julien Nordmann, I notified pathology to send foundation one and PDL 1 testing on recent pathology obtained on 02/17/18.    Treatment Phase Pre-Tx/Tx Discussion  Barriers/Navigation Needs Coordination of Care  Interventions Coordination of Care  Coordination of Care Other  Acuity Level 2  Time Spent with Patient 15

## 2018-03-03 NOTE — Progress Notes (Signed)
[name]   [phone] Multidisciplinary thoracic oncology clinic  CONSULT NOTE  REFERRING PHYSICIAN: Dr. Modesto Charon  REASON FOR CONSULTATION:  63 years old white female recently diagnosed with lung cancer.  HPI [name] is a 63 y.o. female with past medical history significant for coronary artery disease, depression, fibromyalgia, GERD, hypertension, dyslipidemia, migraine headache as well as SVT and anemia.  The patient mentioned that she has been complaining of shortness of breath for 2 weeks with cough and bronchitis.  She was seen by her cardiologist for initial evaluation and to rule out any cardiac underlying condition.  She had CT angiogram of the chest performed on January 12, 2018 and that showed no evidence for pulmonary embolism but there was bilateral groundglass opacities and sub-solid nodules increased in prominence compared to previous studies in 2010.  The findings were concerning for either an inflammatory process but slowly growing malignancy such as adenocarcinoma was not excluded.  The patient was referred to Dr. Roxan Hockey and a PET scan was performed on February 03, 2018 and that showed focal groundglass opacities in the superior segment left lower lobe measuring 1.0 x 0.8 cm with maximum SUV of 0.8.  There was also a 1.7 x 1.0 cm groundglass opacity in the superior segment of the left lower lobe with SUV of 0.8, 1.4 x 0.9 cm right upper lobe groundglass density nodule with SUV of 1.3, anteriorly in the right upper lobe there was a 1.1 x 0.8 cm groundglass nodule with SUV of 0.8 and posteriorly in the left upper lobe there was a 1.6 x 1.3 cm groundglass density with maximum SUV of 0.6. On February 17, 2018 the patient underwent electromagnetic navigational bronchoscopy with needle aspiration, brushing and transbronchial biopsies by Dr. Roxan Hockey. The final pathology ([id number]) showed biopsy of the left upper  lobe nodule was consistent with adenocarcinoma.  There was also evidence of adenocarcinoma and the other nodules that were biopsied. Dr. Roxan Hockey kindly referred the patient to the multidisciplinary thoracic oncology clinic today for further evaluation and recommendation regarding her condition.  When seen today she is feeling fine with no concerning complaints except for mild nausea and occasional diarrhea.  She denied having any chest pain, shortness breath, cough or hemoptysis.  She denied having any weight loss or night sweats.  She has no headache or visual changes. Family history significant for mother with COPD and father had pulmonary fibrosis.  Half brother had stomach cancer. The patient is married and has 4 children.  She used to work in a daycare.  She was accompanied today by a friend Manuela Schwartz.  She has a history of smoking less than 1 pack/day for around 55 years and still smoking.  She has no history of alcohol or drug abuse. HPI  Past Medical History:  Diagnosis Date  . Anemia    "long time ago" (12/06/2012)  . Arthritis    "right knee real bad; in my hands bad" (12/06/2012)  . Asthma   . Coronary artery disease    a. s/p Xience DES to [facility] 04/2008;  b. LHC 05/2008: Proximal RCA 25%, mid RCA stent patent.  c. Anormal nuc 2014 -> s/p LHC with severe mRCA stenosis s/p DES. d. Cath 04/2013 s/p DES to LAD.  e. LHC 08/2015 was stable.  . Depression    "years ago" (12/06/2012)  . Fibromyalgia   . GERD (gastroesophageal reflux disease)   . H/O hiatal hernia   . Hepatitis    "  not A, B, or C" (12/06/2012)  . Hyperlipidemia    intol to statins and other chol agents due to elevated LFTs  . Hypertension   . Lung nodules    Bilateral  . Migraines    "when I was younger" (12/06/2012)  . PONV (postoperative nausea and vomiting)    "Very bad"  . Premature atrial contractions   . PVC's (premature ventricular contractions)   . Sinus headache   . SVT (supraventricular tachycardia) (HCC)      Past Surgical History:  Procedure Laterality Date  . CARDIAC CATHETERIZATION  04/2008; 05/2008  . CARDIAC CATHETERIZATION N/A 08/07/2015   Procedure: Left Heart Cath and Coronary Angiography;  Surgeon: Sherren Mocha, MD;  Location: [place] CV LAB;  Service: Cardiovascular;  Laterality: N/A;  . CHOLECYSTECTOMY    . CORONARY ANGIOPLASTY WITH STENT PLACEMENT  04/2008 12/06/2012   "1 + 1" (12/06/2012)  . CORONARY STENT PLACEMENT  05/08/2013   DES TO LAD      DR MCALHANY  . LEFT HEART CATHETERIZATION WITH CORONARY ANGIOGRAM N/A 12/06/2012   Procedure: LEFT HEART CATHETERIZATION WITH CORONARY ANGIOGRAM;  Surgeon: [name], MD;  Location: [facility] CATH LAB;  Service: Cardiovascular;  Laterality: N/A;  . LEFT HEART CATHETERIZATION WITH CORONARY ANGIOGRAM N/A 05/08/2013   Procedure: LEFT HEART CATHETERIZATION WITH CORONARY ANGIOGRAM;  Surgeon: [name], MD;  Location: [facility] CATH LAB;  Service: Cardiovascular;  Laterality: N/A;  . LEFT HEART CATHETERIZATION WITH CORONARY ANGIOGRAM N/A 01/04/2014   Procedure: LEFT HEART CATHETERIZATION WITH CORONARY ANGIOGRAM;  Surgeon: [name], MD;  Location: [facility] CATH LAB;  Service: Cardiovascular;  Laterality: N/A;  . PERCUTANEOUS CORONARY STENT INTERVENTION (PCI-S)  12/06/2012   Procedure: PERCUTANEOUS CORONARY STENT INTERVENTION (PCI-S);  Surgeon: [name], MD;  Location: [facility] CATH LAB;  Service: Cardiovascular;;  . PERCUTANEOUS CORONARY STENT INTERVENTION (PCI-S)  05/08/2013   Procedure: PERCUTANEOUS CORONARY STENT INTERVENTION (PCI-S);  Surgeon: [name], MD;  Location: [facility] CATH LAB;  Service: Cardiovascular;;  mid LAD   . TUBAL LIGATION    . VAGINAL HYSTERECTOMY    . VIDEO BRONCHOSCOPY WITH ENDOBRONCHIAL NAVIGATION N/A 02/17/2018   Procedure: VIDEO BRONCHOSCOPY WITH ENDOBRONCHIAL NAVIGATION;  Surgeon: Melrose Nakayama, MD;  Location: [facility] OR;  Service: Thoracic;  Laterality: N/A;    Family History   Problem Relation Age of Onset  . CAD Neg Hx     Social History Social History   Tobacco Use  . Smoking status: Former Smoker    Packs/day: 0.50    Years: 30.00    Pack years: 15.00    Types: Cigarettes  . Smokeless tobacco: Never Used  . Tobacco comment: 12/06/2012 "quit smoking cigarettes ~ 8 yr ago"  Substance Use Topics  . Alcohol use: No  . Drug use: No    Allergies  Allergen Reactions  . Ciprofloxacin Hives  . Levofloxacin     UNSPECIFIED REACTION, BUT LIKELY HIVES > HIVES WITH CIPRO  . Statins Other (See Comments)    Liver enzymes  . Tricor [Fenofibrate] Hives and Itching  . Latex Other (See Comments)    Patient states "Messes with my skin."  . Codeine Nausea And Vomiting  . Metoprolol Other (See Comments)    Does not tolerate beta blockers 10/02/2013-pt states she can tolerate 25 mg and lower     Current Outpatient Medications  Medication Sig Dispense Refill  . acetaminophen (TYLENOL) 325 MG tablet Take by mouth.    Marland Kitchen albuterol (PROAIR HFA) 108 (90  Base) MCG/ACT inhaler Inhale 1 puff into the lungs every 6 (six) hours as needed (wheezing/shortness of breath.).     Marland Kitchen albuterol (PROVENTIL) (5 MG/ML) 0.5% nebulizer solution Take 2.5 mg by nebulization every 6 (six) hours as needed for wheezing.    Marland Kitchen amLODipine (NORVASC) 5 MG tablet Take 1.5 tablets (7.5 mg total) by mouth daily. (Patient taking differently: Take 7.5 mg by mouth every evening. ) 180 tablet 3  . aspirin 81 MG chewable tablet Chew 1 tablet (81 mg total) by mouth daily.    Marland Kitchen buPROPion (WELLBUTRIN SR) 150 MG 12 hr tablet Take 150-300 mg by mouth See admin instructions. Take 2 tablets (300 mg) by mouth daily in the morning & take 1 tablet (150 mg) by mouth in the evening.    . clopidogrel (PLAVIX) 75 MG tablet Take 1 tablet (75 mg total) by mouth daily. MUST SCHEDULE APPOINTMENT FOR FURTHER REFILLS 30 tablet 0  . DULoxetine (CYMBALTA) 60 MG capsule Take 120 mg by mouth at bedtime.    . ergocalciferol  (VITAMIN D2) 50000 UNITS capsule Take 50,000 Units by mouth every Wednesday.     Marland Kitchen HYDROcodone-acetaminophen (NORCO) 7.5-325 MG per tablet Take 1 tablet by mouth every 6 (six) hours as needed for pain.    Marland Kitchen levothyroxine (SYNTHROID, LEVOTHROID) 25 MCG tablet Take 25 mcg by mouth daily before breakfast.    . lidocaine (LIDODERM) 5 % Place onto the skin.    Marland Kitchen LORazepam (ATIVAN) 2 MG tablet Take 2 mg by mouth See admin instructions. Take 1 tablet (2 mg) by mouth scheduled at bedtime & take 1 tablet (2 mg) by mouth twice daily if needed for anxiety    . metaxalone (SKELAXIN) 800 MG tablet Take by mouth.    . metoprolol tartrate (LOPRESSOR) 25 MG tablet Take 1 tablet (25 mg total) by mouth 2 (two) times daily. 180 tablet 3  . montelukast (SINGULAIR) 10 MG tablet Take 10 mg by mouth at bedtime.     . SYMBICORT 160-4.5 MCG/ACT inhaler Take 2 puffs by mouth 2 (two) times daily.  3  . guaifenesin (ROBITUSSIN) 100 MG/5ML syrup Take 100-200 mg by mouth 3 (three) times daily as needed for cough.    . nitroGLYCERIN (NITROSTAT) 0.4 MG SL tablet Place 1 tablet (0.4 mg total) under the tongue every 5 (five) minutes as needed for chest pain. (Patient not taking: Reported on 03/03/2018) 25 tablet 12   No current facility-administered medications for this visit.     Review of Systems  Constitutional: negative Eyes: negative Ears, nose, mouth, throat, and face: negative Respiratory: negative Cardiovascular: negative Gastrointestinal: positive for diarrhea and nausea Genitourinary:negative Integument/breast: negative Hematologic/lymphatic: negative Musculoskeletal:negative Neurological: negative Behavioral/Psych: negative Endocrine: negative Allergic/Immunologic: negative  Physical Exam  YYT:KPTWS, healthy, no distress, well nourished, well developed and anxious SKIN: skin color, texture, turgor are normal, no rashes or significant lesions HEAD: Normocephalic, No masses, lesions, tenderness or  abnormalities EYES: normal, PERRLA, Conjunctiva are pink and non-injected EARS: External ears normal, Canals clear OROPHARYNX:no exudate, no erythema and lips, buccal mucosa, and tongue normal  NECK: supple, no adenopathy, no JVD LYMPH:  no palpable lymphadenopathy, no hepatosplenomegaly BREAST:not examined LUNGS: clear to auscultation , and palpation HEART: regular rate & rhythm, no murmurs and no gallops ABDOMEN:abdomen soft, non-tender, normal bowel sounds and no masses or organomegaly BACK: No CVA tenderness, Range of motion is normal EXTREMITIES:no joint deformities, effusion, or inflammation, no edema  NEURO: alert & oriented x 3 with fluent speech, no focal  motor/sensory deficits  PERFORMANCE STATUS: ECOG 1  LABORATORY DATA: Lab Results  Component Value Date   WBC 10.0 02/16/2018   HGB 12.8 02/16/2018   HCT 40.9 02/16/2018   MCV 93.0 02/16/2018   PLT 247 02/16/2018      Chemistry      Component Value Date/Time   NA 141 02/16/2018 1525   NA 143 01/10/2018 1629   K 3.6 02/16/2018 1525   CL 102 02/16/2018 1525   CO2 29 02/16/2018 1525   BUN 17 02/16/2018 1525   BUN 14 01/10/2018 1629   CREATININE 0.78 02/16/2018 1525   CREATININE 0.68 08/05/2015 1456      Component Value Date/Time   CALCIUM 9.1 02/16/2018 1525   ALKPHOS 80 02/16/2018 1525   AST 42 (H) 02/16/2018 1525   ALT 55 (H) 02/16/2018 1525   BILITOT 0.8 02/16/2018 1525   BILITOT 0.3 02/01/2018 1539       RADIOGRAPHIC STUDIES: Dg Chest 2 View  Result Date: 02/17/2018 CLINICAL DATA:  Preoperative evaluation for lung biopsy, history PVCs, hypertension, GERD, coronary artery disease, asthma, former smoker EXAM: CHEST - 2 VIEW COMPARISON:  CT chest 01/12/2018 FINDINGS: Normal heart size, mediastinal contours, and pulmonary vascularity. Vague areas of ill-defined density are seen in the upper lobes bilaterally, corresponding to poorly defined focal opacities in the lungs on prior CT. Underlying emphysematous  and minimal bronchitic changes. Remaining lungs clear. No pleural effusion or pneumothorax. Bones demineralized. IMPRESSION: COPD changes with vague opacities in the upper lobes bilaterally corresponding to poorly defined opacities at same sites on recent CT; these could be inflammatory or neoplastic. No additional focal abnormalities. Electronically Signed   By: Lavonia Dana M.D.   On: 02/17/2018 08:30   Nm Pet Image Initial (pi) Skull Base To Thigh  Result Date: 02/04/2018 CLINICAL DATA:  Initial treatment strategy for solitary pulmonary nodule. EXAM: NUCLEAR MEDICINE PET SKULL BASE TO THIGH TECHNIQUE: 8.4 mCi F-18 FDG was injected intravenously. Full-ring PET imaging was performed from the skull base to thigh after the radiotracer. CT data was obtained and used for attenuation correction and anatomic localization. Fasting blood glucose: 115 mg/dl COMPARISON:  Chest CT from 01/12/2018 FINDINGS: Mediastinal blood pool activity: SUV max 2.5 NECK: A focus of accentuated metabolic activity most closely correlating to a small deep parotid node on the right image 25/4 has maximum SUV of 4.3. The associated node measures about 0.6 by 1.1 cm on image 25/4. Symmetric and likely physiologic activity along the palatine tonsils glottic structures. Incidental CT findings: Mild chronic left maxillary sinusitis. CHEST: Focal ground-glass opacity in the superior segment left lower lobe, 1.0 by 0.8 cm on image 16/8, maximum SUV 0.8. The indistinct approximately 1.7 by 1.0 cm ground-glass opacity further caudad in the superior segment left lower lobe on image 22/8 has a maximum SUV of 0.8. The 1.4 by 0.9 cm right upper lobe ground-glass density nodule on image 25/8 has a maximum SUV of 1.3. Anteriorly in the right upper lobe a 1.1 by 0.8 cm ground-glass density nodule on image 29/8 has a maximum SUV of 0.8. Posteriorly in the left upper lobe, a 1.6 by 1.3 cm ground-glass density nodule on image 36/8 has a maximum SUV of 0.6.  Incidental CT findings: Centrilobular emphysema. Coronary, aortic arch, and branch vessel atherosclerotic vascular disease. ABDOMEN/PELVIS: Accentuated activity along the transverse abdominis muscles, right greater than left, without appreciable CT abnormality, likely physiologic. Incidental CT findings: Cholecystectomy. Aortoiliac atherosclerotic vascular disease. Pelvic floor laxity. SKELETON: No significant abnormal  hypermetabolic activity in this region. Incidental CT findings: none IMPRESSION: 1. Five ground-glass opacity nodules all demonstrate low-grade activity. The highest activity is associated with the 1.4 by 0.9 cm right upper lobe nodule shown on image 25/8, but the maximum SUV of that nodule is only 1.3. Low-grade adenocarcinoma can have a low maximum standard uptake value, and careful attention to and surveillance of morphologic changes in these nodules diagnostic CT scans should be ascribed high value in assessing for low-grade adenocarcinoma. 2. Small focus of accentuated activity along the deep portion of the right parotid gland has maximum SUV of 4.3, mildly elevated. This could be from the small 0.6 cm parotid lymph node in this vicinity, versus a small Warthin's tumor. 3. Other imaging findings of potential clinical significance: Aortic Atherosclerosis (ICD10-I70.0) and Emphysema (ICD10-J43.9). Coronary atherosclerosis. Pelvic floor laxity. Electronically Signed   By: Van Clines M.D.   On: 02/04/2018 07:38   Dg Chest Port 1 View  Result Date: 02/17/2018 CLINICAL DATA:  S/p bronchoscopy with biopsy. EXAM: PORTABLE CHEST 1 VIEW COMPARISON:  02/16/2018 FINDINGS: Left perihilar infiltrate noted, likely related to bronchoscopy. No visible pneumothorax. Heart is normal size. No effusions. No acute bony abnormality. IMPRESSION: No pneumothorax following bronchoscopy and biopsy. Electronically Signed   By: Rolm Baptise M.D.   On: 02/17/2018 10:33   Dg C-arm Bronchoscopy  Result Date:  02/17/2018 C-ARM BRONCHOSCOPY: Fluoroscopy was utilized by the requesting physician.  No radiographic interpretation.    ASSESSMENT: This is a very pleasant 63 years old white female with stage IV (T1a, N0, M1 a) non-small cell lung cancer, adenocarcinoma presented with multifocal disease in both lungs including 3 nodules in the left lung as well as 2 nodules in the right lung diagnosed in December 2019   PLAN: I had a lengthy discussion with the patient and her friend today about her current disease stage, prognosis and treatment options. I personally and independently reviewed the imaging studies and discussed with the patient today. I recommended for the patient to complete the staging work-up by ordering MRI of the brain to rule out brain metastasis. I also recommend for the patient to have the tissue biopsy sent to [facility] for molecular studies and PDL 1 expression. I discussed with the patient several options for management of her condition including continuous observation and monitoring versus consideration of systemic chemotherapy plus/minus immunotherapy versus treatment with targeted therapy if she has an actionable mutation or treatment with monotherapy with immunotherapy if she has high PDL 1 expression. The patient would like to wait until we have the molecular studies available for further discussion and recommendation regarding her condition. I will see her back for follow-up visit in around 3 weeks for evaluation and discussion of the molecular studies and treatment options at that time. For smoking cessation, I strongly encouraged the patient to quit smoking and offered her a smoke cessation program. The patient was advised to call immediately if she has any concerning symptoms in the interval. The patient voices understanding of current disease status and treatment options and is in agreement with the current care plan.  All questions were answered. The patient knows to call  the clinic with any problems, questions or concerns. We can certainly see the patient much sooner if necessary.  Thank you so much for allowing me to participate in the care of Fredrik Cove. I will continue to follow up the patient with you and assist in her care.  I spent 40 minutes counseling the  patient face to face. The total time spent in the appointment was 60 minutes.  Disclaimer: This note was dictated with voice recognition software. Similar sounding words can inadvertently be transcribed and may not be corrected upon review.   Eilleen Kempf March 03, 2018, 3:50 PM

## 2018-03-03 NOTE — Telephone Encounter (Signed)
Printed avs and calender of upcoming appointment. Per 1/2

## 2018-03-03 NOTE — Anesthesia Postprocedure Evaluation (Signed)
Anesthesia Post Note  Patient: [name]  Procedure(s) Performed: VIDEO BRONCHOSCOPY WITH ENDOBRONCHIAL NAVIGATION (N/A )     Patient location during evaluation: PACU Anesthesia Type: General Level of consciousness: awake and alert Pain management: pain level controlled Vital Signs Assessment: post-procedure vital signs reviewed and stable Respiratory status: spontaneous breathing, nonlabored ventilation, respiratory function stable and patient connected to nasal cannula oxygen Cardiovascular status: blood pressure returned to baseline and stable Postop Assessment: no apparent nausea or vomiting Anesthetic complications: no    Last Vitals:  Vitals:   02/17/18 1020 02/17/18 1120  BP: (!) 87/57 99/76  Pulse: 74 74  Resp: 13   Temp: (!) 36.1 C   SpO2: 98% 98%    Last Pain:  Vitals:   02/17/18 1120  TempSrc:   PainSc: 0-No pain                 Shadia Larose

## 2018-03-03 NOTE — Progress Notes (Signed)
The proposed treatment discussed in thoracic cancer conference 03/03/18 is for discussion purpose only and is not a binding recommendation.  The patient was not physically examined nor present for their treatment options.  Therefore, final treatment plans cannot be decided.

## 2018-03-05 ENCOUNTER — Encounter: Payer: Self-pay | Admitting: Internal Medicine

## 2018-03-05 DIAGNOSIS — Z716 Tobacco abuse counseling: Secondary | ICD-10-CM | POA: Insufficient documentation

## 2018-03-07 ENCOUNTER — Telehealth: Payer: Self-pay | Admitting: *Deleted

## 2018-03-07 NOTE — Telephone Encounter (Signed)
Oncology Nurse Navigator Documentation  Oncology Nurse Navigator Flowsheets 03/07/2018  Navigator Location [facility]  Navigator Encounter Type Clinic/MDC;Telephone/I received a message from chemo ed that patient call with questions regarding next steps.  I called [name] and update her that we are waiting for molecular testing results to be completed to give Dr. Julien Nordmann more information on best treatment plan.  She verbalized understanding of plan of care.  She is set up for MRI on 03/10/18 and is aware of that appt too.   Telephone Outgoing Call  Abnormal Finding Date 01/12/2018  Confirmed Diagnosis Date 02/17/2018  Multidisiplinary Clinic Date 03/03/2018  Patient Visit Type MedOnc  Treatment Phase Pre-Tx/Tx Discussion  Barriers/Navigation Needs Education;Coordination of Care  Education Other  Interventions Coordination of Care;Education  Coordination of Care Other  Education Method Verbal;Written  Acuity Level 2  Time Spent with Patient 38

## 2018-03-10 ENCOUNTER — Ambulatory Visit (HOSPITAL_COMMUNITY): Admission: RE | Admit: 2018-03-10 | Payer: Managed Care, Other (non HMO) | Source: Ambulatory Visit

## 2018-03-10 ENCOUNTER — Other Ambulatory Visit: Payer: Self-pay | Admitting: Medical Oncology

## 2018-03-10 ENCOUNTER — Telehealth: Payer: Self-pay | Admitting: Medical Oncology

## 2018-03-10 DIAGNOSIS — C3490 Malignant neoplasm of unspecified part of unspecified bronchus or lung: Secondary | ICD-10-CM

## 2018-03-10 NOTE — Telephone Encounter (Signed)
MRI auth for [place] to [place] for signature then needs to be faxed to [phone].

## 2018-03-15 ENCOUNTER — Encounter (HOSPITAL_COMMUNITY): Payer: Self-pay | Admitting: Thoracic Surgery (Cardiothoracic Vascular Surgery)

## 2018-03-17 ENCOUNTER — Ambulatory Visit (HOSPITAL_COMMUNITY): Payer: Managed Care, Other (non HMO) | Attending: Internal Medicine

## 2018-03-17 NOTE — Progress Notes (Deleted)
Cardiology Office Note    Date:  03/17/2018  ID:  [name], DOB 04/03/1955, MRN [id number] PCP:  [name], NP  Cardiologist:  Lauree Chandler, MD   Chief Complaint: f/u cardiac testing  History of Present Illness:  [name] is a 63 y.o. female with history of CAD s/p multiple prior stents as below, PVCs, PACs, SVT, asthma, depression, HTN, hyperlipidemia, GERD, fibromyalgia, remote anemia, hiatal hernia who presents for 4 week follow-up.  To recap, LHC 04/2008 demonstrated 80% stenosis in the mid RCA treated with a Xience DES. Re-look LHC 05/2008 showed proximal RCA 25%, mid RCA stent patent. She was previously noted to have possible SVT during cardiac rehabilitation 05/2008. Holter 03/2009: NSR, PACs and PVCs. Liver biopsy 2011 reportedly normal (steatohepatitis). Stress myoview on 11/21/12 showed a reversible basal inferior perfusion defect prompting cath 12/06/12 showing severe stenosis in the mid RCA just before the old stent which also had restenosis -> entire segment treated with a 2.75 x 28 mm Promus Premier DES. She had recurrent CP in 04/2013 with cath showing severe stenosis mid LAD s/p DES. She called our office in August 2015 c/o tachycardia, heart racing. Lopressor 12.5 mg po BID started. 24 hour monitor November 2015 with PACs/PVCs and 4 beat run SVT.  Last cath in 08/2015 done for chest pain and SOB showed stable CAD with continued patency, 20% mRCA, 50% ostial RPDA, 10% prox-mid LAD, LVEF normal. Due to statin intolerance/elevated LFTs she's also been followed in the lipid clinic. When I met her in 09/2016 she was reporting some SOB. BNP was normal. Amlodipine was started (unable to tolerate higher doses of BB due to fatigue). Nuclear stress test 09/2016 was unremarkable, EF 48% but f/u echo confirmed normal EF 55-60%.  More recently she saw Lyda Jester PA-C 01/10/18 with exertional dypnea and palpitations. EKG showed sinus tachycardia. Labs showed Hgb 15.1,  mild leukocytosis similar to prior, normal pro BNP, Cr 0.70, K 3.9, CO2 30 (mildly elevated), LDL 198, LFTs ok except ALT 53 (intermittently elevated), TSH 5.170, abnormal d-dimer. Nuclear stress test 01/20/18 was normal, EF 59%. Monitor showed rare PVCs, frequent PACs, several short runs of SVT, NSR. CT angio to rule out PE showed no acute PE but the report did state, "Bilateral ground-glass opacities and sub solid nodules have increased in prominence compared to the prior study dated 2010. Findings may represent an inflammatory process, however slow-growing malignancy such as adenocarcinoma is not excluded. Consider multidisciplinary thoracic oncology conference discussion for further management." She was subsequently referred to Dr. Roxan Hockey. On February 17, 2018 the patient underwent electromagnetic navigational bronchoscopy with needle aspiration, brushing and transbronchial biopsies by Dr. Roxan Hockey. The final pathology ([id number]) showed biopsy of the left upper lobe nodule was consistent with adenocarcinoma.She is now being followed by oncology.   i met in 09/2016 fam hx   Shortness of breath CAD Essential HTN Hyperlipidemia SVT/PACs/PVCs  Past Medical History:  Diagnosis Date  . Anemia    "long time ago" (12/06/2012)  . Arthritis    "right knee real bad; in my hands bad" (12/06/2012)  . Asthma   . Coronary artery disease    a. s/p Xience DES to [facility] 04/2008;  b. LHC 05/2008: Proximal RCA 25%, mid RCA stent patent.  c. Anormal nuc 2014 -> s/p LHC with severe mRCA stenosis s/p DES. d. Cath 04/2013 s/p DES to LAD.  e. LHC 08/2015 was stable.  . Depression    "years ago" (12/06/2012)  . Fibromyalgia   .  GERD (gastroesophageal reflux disease)   . H/O hiatal hernia   . Hepatitis    "not A, B, or C" (12/06/2012)  . Hyperlipidemia    intol to statins and other chol agents due to elevated LFTs  . Hypertension   . Lung nodules    Bilateral  . Migraines    "when I was younger"  (12/06/2012)  . PONV (postoperative nausea and vomiting)    "Very bad"  . Premature atrial contractions   . PVC's (premature ventricular contractions)   . Sinus headache   . SVT (supraventricular tachycardia) (HCC)     Past Surgical History:  Procedure Laterality Date  . CARDIAC CATHETERIZATION  04/2008; 05/2008  . CARDIAC CATHETERIZATION N/A 08/07/2015   Procedure: Left Heart Cath and Coronary Angiography;  Surgeon: Sherren Mocha, MD;  Location: [place] CV LAB;  Service: Cardiovascular;  Laterality: N/A;  . CHOLECYSTECTOMY    . CORONARY ANGIOPLASTY WITH STENT PLACEMENT  04/2008 12/06/2012   "1 + 1" (12/06/2012)  . CORONARY STENT PLACEMENT  05/08/2013   DES TO LAD      DR MCALHANY  . LEFT HEART CATHETERIZATION WITH CORONARY ANGIOGRAM N/A 12/06/2012   Procedure: LEFT HEART CATHETERIZATION WITH CORONARY ANGIOGRAM;  Surgeon: [name], MD;  Location: [facility] CATH LAB;  Service: Cardiovascular;  Laterality: N/A;  . LEFT HEART CATHETERIZATION WITH CORONARY ANGIOGRAM N/A 05/08/2013   Procedure: LEFT HEART CATHETERIZATION WITH CORONARY ANGIOGRAM;  Surgeon: [name], MD;  Location: [facility] CATH LAB;  Service: Cardiovascular;  Laterality: N/A;  . LEFT HEART CATHETERIZATION WITH CORONARY ANGIOGRAM N/A 01/04/2014   Procedure: LEFT HEART CATHETERIZATION WITH CORONARY ANGIOGRAM;  Surgeon: [name], MD;  Location: [facility] CATH LAB;  Service: Cardiovascular;  Laterality: N/A;  . PERCUTANEOUS CORONARY STENT INTERVENTION (PCI-S)  12/06/2012   Procedure: PERCUTANEOUS CORONARY STENT INTERVENTION (PCI-S);  Surgeon: [name], MD;  Location: [facility] CATH LAB;  Service: Cardiovascular;;  . PERCUTANEOUS CORONARY STENT INTERVENTION (PCI-S)  05/08/2013   Procedure: PERCUTANEOUS CORONARY STENT INTERVENTION (PCI-S);  Surgeon: [name], MD;  Location: [facility] CATH LAB;  Service: Cardiovascular;;  mid LAD   . TUBAL LIGATION    . VAGINAL HYSTERECTOMY    . VIDEO BRONCHOSCOPY WITH  ENDOBRONCHIAL NAVIGATION N/A 02/17/2018   Procedure: VIDEO BRONCHOSCOPY WITH ENDOBRONCHIAL NAVIGATION;  Surgeon: Melrose Nakayama, MD;  Location: [place];  Service: Thoracic;  Laterality: N/A;    Current Medications: No outpatient medications have been marked as taking for the 03/18/18 encounter (Appointment) with Charlie Pitter, PA-C.   ***   Allergies:   Ciprofloxacin; Levofloxacin; Statins; Tricor [fenofibrate]; Latex; Codeine; and Metoprolol   Social History   Socioeconomic History  . Marital status: Married    Spouse name: Not on file  . Number of children: Not on file  . Years of education: Not on file  . Highest education level: Not on file  Occupational History  . Not on file  Social Needs  . Financial resource strain: Not on file  . Food insecurity:    Worry: Not on file    Inability: Not on file  . Transportation needs:    Medical: Not on file    Non-medical: Not on file  Tobacco Use  . Smoking status: Former Smoker    Packs/day: 0.50    Years: 30.00    Pack years: 15.00    Types: Cigarettes  . Smokeless tobacco: Never Used  . Tobacco comment: 12/06/2012 "quit smoking cigarettes ~ 8 yr ago"  Substance  and Sexual Activity  . Alcohol use: No  . Drug use: No  . Sexual activity: Yes  Lifestyle  . Physical activity:    Days per week: Not on file    Minutes per session: Not on file  . Stress: Not on file  Relationships  . Social connections:    Talks on phone: Not on file    Gets together: Not on file    Attends religious service: Not on file    Active member of club or organization: Not on file    Attends meetings of clubs or organizations: Not on file    Relationship status: Not on file  Other Topics Concern  . Not on file  Social History Narrative  . Not on file     Family History:  The patient's ***family history is negative for CAD.  ROS:   Please see the history of present illness. Otherwise, review of systems is positive for ***.  All other  systems are reviewed and otherwise negative.    PHYSICAL EXAM:   VS:  There were no vitals taken for this visit.  BMI: There is no height or weight on file to calculate BMI. GEN: Well nourished, well developed, in no acute distress HEENT: normocephalic, atraumatic Neck: no JVD, carotid bruits, or masses Cardiac: ***RRR; no murmurs, rubs, or gallops, no edema  Respiratory:  clear to auscultation bilaterally, normal work of breathing GI: soft, nontender, nondistended, + BS MS: no deformity or atrophy Skin: warm and dry, no rash Neuro:  Alert and Oriented x 3, Strength and sensation are intact, follows commands Psych: euthymic mood, full affect  Wt Readings from Last 3 Encounters:  03/03/18 163 lb 12.8 oz (74.3 kg)  02/17/18 162 lb (73.5 kg)  02/16/18 162 lb (73.5 kg)      Studies/Labs Reviewed:   EKG:  EKG was ordered today and personally reviewed by me and demonstrates *** EKG was not ordered today.***  Recent Labs: 01/10/2018: NT-Pro BNP 69; TSH 5.170 02/16/2018: ALT 55; BUN 17; Creatinine, Ser 0.78; Hemoglobin 12.8; Platelets 247; Potassium 3.6; Sodium 141   Lipid Panel    Component Value Date/Time   CHOL 284 (H) 02/01/2018 1539   TRIG 248 (H) 02/01/2018 1539   HDL 51 02/01/2018 1539   CHOLHDL 5.6 (H) 02/01/2018 1539   CHOLHDL 4.2 08/22/2015 1408   VLDL 37 (H) 08/22/2015 1408   LDLCALC 183 (H) 02/01/2018 1539   LDLDIRECT 198 (H) 02/01/2018 1539   LDLDIRECT 186 (H) 08/22/2015 1408    Additional studies/ records that were reviewed today include: Summarized above.***    ASSESSMENT & PLAN:   1. ***  Disposition: F/u with ***   Medication Adjustments/Labs and Tests Ordered: Current medicines are reviewed at length with the patient today.  Concerns regarding medicines are outlined above. Medication changes, Labs and Tests ordered today are summarized above and listed in the Patient Instructions accessible in Encounters.   Signed, Charlie Pitter, PA-C  03/17/2018  9:06 AM    [address], [place], [place]  [address] Phone: [phone]; Fax: [phone]

## 2018-03-17 NOTE — Telephone Encounter (Signed)
LMOM again for pt regarding PCSK9i therapy. At prior visit, she stated she would not want to pursue PCSK9i therapy if her biopsy came back positive for cancer.   Per oncology note 03/03/18: "stage IV (T1a, N0, M1 a) non-small cell lung cancer, adenocarcinoma presented with multifocal disease in both lungs including 3 nodules in the left lung as well as 2 nodules in the right lung diagnosed in December 2019."

## 2018-03-18 ENCOUNTER — Ambulatory Visit: Payer: Managed Care, Other (non HMO) | Admitting: Physician Assistant

## 2018-03-22 ENCOUNTER — Encounter (HOSPITAL_COMMUNITY): Payer: Self-pay | Admitting: Thoracic Surgery (Cardiothoracic Vascular Surgery)

## 2018-03-25 ENCOUNTER — Encounter (HOSPITAL_COMMUNITY): Payer: Self-pay | Admitting: Thoracic Surgery (Cardiothoracic Vascular Surgery)

## 2018-03-28 ENCOUNTER — Telehealth: Payer: Self-pay | Admitting: *Deleted

## 2018-03-28 NOTE — Telephone Encounter (Signed)
Oncology Nurse Navigator Documentation  Oncology Nurse Navigator Flowsheets 03/28/2018  Navigator Location [facility]  Navigator Encounter Type Telephone/I called Ms. Mathias today to check about her MR Brain scan.  I was unable to reach her but did leave a vm message for her to call me with an update. I also left my name and phone number to call.   Telephone Outgoing Call  Treatment Phase Pre-Tx/Tx Discussion  Barriers/Navigation Needs Education;Coordination of Care  Education Other  Interventions Coordination of Care;Education  Coordination of Care Other  Education Method Verbal  Acuity Level 1  Time Spent with Patient 15

## 2018-03-29 ENCOUNTER — Telehealth: Payer: Self-pay | Admitting: *Deleted

## 2018-03-29 NOTE — Telephone Encounter (Signed)
Oncology Nurse Navigator Documentation  Oncology Nurse Navigator Flowsheets 03/29/2018  Navigator Location [facility]  Navigator Encounter Type Telephone/I received a vm message from Ms. Meddaugh.  I called her back.  I followed up with her about her MRI Brain.  She states she is having it at [facility] on 04/02/2018.  I asked her to obtain a CD of MRI scan.  She verbalized understanding of the need for CD of scan.   She talked to me about being anxious about her appt on 2/4 with Dr. Julien Nordmann and was wondering what he would say to her.  I listened as she explained.  I did say to her that we will wait until 2/4 to give her more information about her treatment plan.    Telephone Outgoing Call  Abnormal Finding Date 01/12/2018  Confirmed Diagnosis Date 02/17/2018  Multidisiplinary Clinic Date 03/03/2018  Treatment Phase Pre-Tx/Tx Discussion  Barriers/Navigation Needs Education  Education Other  Interventions Education  Education Method Verbal  Acuity Level 1  Time Spent with Patient 30

## 2018-04-05 ENCOUNTER — Inpatient Hospital Stay: Payer: Managed Care, Other (non HMO) | Attending: Internal Medicine | Admitting: Internal Medicine

## 2018-04-05 ENCOUNTER — Telehealth: Payer: Self-pay | Admitting: Internal Medicine

## 2018-04-05 ENCOUNTER — Encounter: Payer: Self-pay | Admitting: Internal Medicine

## 2018-04-05 VITALS — BP 140/95 | HR 62 | Temp 99.6°F | Resp 20 | Ht 67.0 in | Wt 166.6 lb

## 2018-04-05 DIAGNOSIS — I2510 Atherosclerotic heart disease of native coronary artery without angina pectoris: Secondary | ICD-10-CM

## 2018-04-05 DIAGNOSIS — M1990 Unspecified osteoarthritis, unspecified site: Secondary | ICD-10-CM | POA: Diagnosis not present

## 2018-04-05 DIAGNOSIS — K219 Gastro-esophageal reflux disease without esophagitis: Secondary | ICD-10-CM | POA: Diagnosis not present

## 2018-04-05 DIAGNOSIS — G479 Sleep disorder, unspecified: Secondary | ICD-10-CM | POA: Insufficient documentation

## 2018-04-05 DIAGNOSIS — Z5111 Encounter for antineoplastic chemotherapy: Secondary | ICD-10-CM | POA: Insufficient documentation

## 2018-04-05 DIAGNOSIS — Z7982 Long term (current) use of aspirin: Secondary | ICD-10-CM

## 2018-04-05 DIAGNOSIS — Z5112 Encounter for antineoplastic immunotherapy: Secondary | ICD-10-CM | POA: Insufficient documentation

## 2018-04-05 DIAGNOSIS — J069 Acute upper respiratory infection, unspecified: Secondary | ICD-10-CM | POA: Insufficient documentation

## 2018-04-05 DIAGNOSIS — M797 Fibromyalgia: Secondary | ICD-10-CM | POA: Insufficient documentation

## 2018-04-05 DIAGNOSIS — Z7902 Long term (current) use of antithrombotics/antiplatelets: Secondary | ICD-10-CM | POA: Insufficient documentation

## 2018-04-05 DIAGNOSIS — I10 Essential (primary) hypertension: Secondary | ICD-10-CM

## 2018-04-05 DIAGNOSIS — Z79899 Other long term (current) drug therapy: Secondary | ICD-10-CM | POA: Insufficient documentation

## 2018-04-05 DIAGNOSIS — Z7189 Other specified counseling: Secondary | ICD-10-CM | POA: Insufficient documentation

## 2018-04-05 DIAGNOSIS — C3492 Malignant neoplasm of unspecified part of left bronchus or lung: Secondary | ICD-10-CM | POA: Insufficient documentation

## 2018-04-05 DIAGNOSIS — R5382 Chronic fatigue, unspecified: Secondary | ICD-10-CM

## 2018-04-05 DIAGNOSIS — E785 Hyperlipidemia, unspecified: Secondary | ICD-10-CM

## 2018-04-05 DIAGNOSIS — R5383 Other fatigue: Secondary | ICD-10-CM | POA: Insufficient documentation

## 2018-04-05 MED ORDER — PROCHLORPERAZINE MALEATE 10 MG PO TABS: 10.0000 mg | ORAL_TABLET | Freq: Four times a day (QID) | ORAL | 0 refills | Status: DC | PRN

## 2018-04-05 MED ORDER — FOLIC ACID 1 MG PO TABS: 1.0000 mg | ORAL_TABLET | Freq: Every day | ORAL | 4 refills | Status: DC

## 2018-04-05 MED ORDER — CYANOCOBALAMIN 1000 MCG/ML IJ SOLN
1000.0000 ug | Freq: Once | INTRAMUSCULAR | Status: AC
  Administered 2018-04-05: 1000 ug via INTRAMUSCULAR

## 2018-04-05 MED ORDER — CYANOCOBALAMIN 1000 MCG/ML IJ SOLN
INTRAMUSCULAR | Status: AC
  Filled 2018-04-05: qty 1

## 2018-04-05 NOTE — Telephone Encounter (Signed)
Gave patient avs report and appointments for February and March

## 2018-04-05 NOTE — Patient Instructions (Signed)
Cyanocobalamin, Vitamin B12 injection What is this medicine? CYANOCOBALAMIN (sye an oh koe BAL a min) is a man made form of vitamin B12. Vitamin B12 is used in the growth of healthy blood cells, nerve cells, and proteins in the body. It also helps with the metabolism of fats and carbohydrates. This medicine is used to treat people who can not absorb vitamin B12. This medicine may be used for other purposes; ask your health care provider or pharmacist if you have questions. COMMON BRAND NAME(S): B-12 Compliance Kit, B-12 Injection Kit, Cyomin, LA-12, Nutri-Twelve, Physicians EZ Use B-12, Primabalt What should I tell my health care provider before I take this medicine? They need to know if you have any of these conditions: -kidney disease -Leber's disease -megaloblastic anemia -an unusual or allergic reaction to cyanocobalamin, cobalt, other medicines, foods, dyes, or preservatives -pregnant or trying to get pregnant -breast-feeding How should I use this medicine? This medicine is injected into a muscle or deeply under the skin. It is usually given by a health care professional in a clinic or doctor's office. However, your doctor may teach you how to inject yourself. Follow all instructions. Talk to your pediatrician regarding the use of this medicine in children. Special care may be needed. Overdosage: If you think you have taken too much of this medicine contact a poison control center or emergency room at once. NOTE: This medicine is only for you. Do not share this medicine with others. What if I miss a dose? If you are given your dose at a clinic or doctor's office, call to reschedule your appointment. If you give your own injections and you miss a dose, take it as soon as you can. If it is almost time for your next dose, take only that dose. Do not take double or extra doses. What may interact with this medicine? -colchicine -heavy alcohol intake This list may not describe all possible  interactions. Give your health care provider a list of all the medicines, herbs, non-prescription drugs, or dietary supplements you use. Also tell them if you smoke, drink alcohol, or use illegal drugs. Some items may interact with your medicine. What should I watch for while using this medicine? Visit your doctor or health care professional regularly. You may need blood work done while you are taking this medicine. You may need to follow a special diet. Talk to your doctor. Limit your alcohol intake and avoid smoking to get the best benefit. What side effects may I notice from receiving this medicine? Side effects that you should report to your doctor or health care professional as soon as possible: -allergic reactions like skin rash, itching or hives, swelling of the face, lips, or tongue -blue tint to skin -chest tightness, pain -difficulty breathing, wheezing -dizziness -red, swollen painful area on the leg Side effects that usually do not require medical attention (report to your doctor or health care professional if they continue or are bothersome): -diarrhea -headache This list may not describe all possible side effects. Call your doctor for medical advice about side effects. You may report side effects to FDA at 1-800-FDA-1088. Where should I keep my medicine? Keep out of the reach of children. Store at room temperature between 15 and 30 degrees C (59 and 85 degrees F). Protect from light. Throw away any unused medicine after the expiration date. NOTE: This sheet is a summary. It may not cover all possible information. If you have questions about this medicine, talk to your doctor, pharmacist, or   health care provider.  2019 Elsevier/Gold Standard (2007-05-30 22:10:20)  

## 2018-04-05 NOTE — Progress Notes (Signed)
[place] [phone]   [phone]  OFFICE PROGRESS NOTE  [name], NP [place] [address]  DIAGNOSIS: stage IV (T1a, N0, M1 a) non-small cell lung cancer, adenocarcinoma presented with multifocal disease in both lungs including 3 nodules in the left lung as well as 2 nodules in the right lung diagnosed in December 2019.  No actionable mutations KRAS G12C BCOR [id number]-subclonal RAD21 amplification  PDL 1 expression 1%.  PRIOR THERAPY: None.   CURRENT THERAPY: Systemic chemotherapy with carboplatin for AUC of 5, Alimta 500 mg/M2 and Keytruda 200 mg IV every 3 weeks.  First dose April 12, 2018.  INTERVAL HISTORY: [name] 63 y.o. female returns to the clinic today for follow-up visit.  The patient is feeling fine today with no concerning complaints except for fatigue.  She had MRI of the brain performed that [facility] that showed no concerning findings for metastatic disease to the brain.  She denied having any current chest pain but has shortness of breath with exertion with no cough or hemoptysis.  She has no nausea, vomiting, diarrhea or constipation.  She denied having any headache or visual changes.  She had molecular studies performed by [facility] that showed no actionable mutations.  She also has PDL 1 expression that was reported to be 1%.  The patient is here today for evaluation and discussion of her treatment options based on the recent results.  MEDICAL HISTORY: Past Medical History:  Diagnosis Date  . Anemia    "long time ago" (12/06/2012)  . Arthritis    "right knee real bad; in my hands bad" (12/06/2012)  . Asthma   . Coronary artery disease    a. s/p Xience DES to [facility] 04/2008;  b. LHC 05/2008: Proximal RCA 25%, mid RCA stent patent.  c. Anormal nuc 2014 -> s/p LHC with severe mRCA stenosis s/p DES. d. Cath 04/2013 s/p DES to LAD.  e. LHC 08/2015 was stable.  . Depression    "years ago" (12/06/2012)  . Fibromyalgia   . GERD (gastroesophageal reflux disease)   . H/O hiatal hernia   . Hepatitis    "not A, B, or C" (12/06/2012)  . Hyperlipidemia    intol to statins and other chol agents due to elevated LFTs  . Hypertension   . Lung nodules    Bilateral  . Migraines    "when I was younger" (12/06/2012)  . PONV (postoperative nausea and vomiting)    "Very bad"  . Premature atrial contractions   . PVC's (premature ventricular contractions)   . Sinus headache   . SVT (supraventricular tachycardia) (HCC)     ALLERGIES:  is allergic to ciprofloxacin; levofloxacin; statins; tricor [fenofibrate]; latex; codeine; and metoprolol.  MEDICATIONS:  Current Outpatient Medications  Medication Sig Dispense Refill  . albuterol (PROAIR HFA) 108 (90 Base) MCG/ACT inhaler Inhale 1 puff into the lungs every 6 (six) hours as needed (wheezing/shortness of breath.).     Marland Kitchen albuterol (PROVENTIL) (5 MG/ML) 0.5% nebulizer solution Take 2.5 mg by nebulization every 6 (six) hours as needed for wheezing.    Marland Kitchen amLODipine (NORVASC) 5 MG tablet Take 1.5 tablets (7.5 mg total) by mouth daily. (Patient taking differently: Take 7.5 mg by mouth every evening. ) 180 tablet 3  . aspirin 81 MG chewable tablet Chew 1 tablet (81 mg total) by mouth daily.    Marland Kitchen buPROPion (WELLBUTRIN SR) 150 MG 12 hr tablet Take  150-300 mg by mouth See admin instructions. Take 2 tablets (300 mg) by mouth daily in the morning & take 1 tablet (150 mg) by mouth in the evening.    . clopidogrel (PLAVIX) 75 MG tablet Take 1 tablet (75 mg total) by mouth daily. MUST SCHEDULE APPOINTMENT FOR FURTHER REFILLS 30 tablet 0  . DULoxetine (CYMBALTA) 60 MG capsule Take 120 mg by mouth at bedtime.    . ergocalciferol (VITAMIN D2) 50000 UNITS capsule Take 50,000 Units by mouth every Wednesday.     Marland Kitchen guaifenesin (ROBITUSSIN) 100 MG/5ML syrup Take 100-200 mg by mouth 3 (three) times daily as needed for cough.    Marland Kitchen  HYDROcodone-acetaminophen (NORCO) 7.5-325 MG per tablet Take 1 tablet by mouth every 6 (six) hours as needed for pain.    Marland Kitchen levothyroxine (SYNTHROID, LEVOTHROID) 25 MCG tablet Take 25 mcg by mouth daily before breakfast.    . LORazepam (ATIVAN) 2 MG tablet Take 2 mg by mouth See admin instructions. Take 1 tablet (2 mg) by mouth scheduled at bedtime & take 1 tablet (2 mg) by mouth twice daily if needed for anxiety    . metoprolol tartrate (LOPRESSOR) 25 MG tablet Take 1 tablet (25 mg total) by mouth 2 (two) times daily. 180 tablet 3  . montelukast (SINGULAIR) 10 MG tablet Take 10 mg by mouth at bedtime.     . nitroGLYCERIN (NITROSTAT) 0.4 MG SL tablet Place 1 tablet (0.4 mg total) under the tongue every 5 (five) minutes as needed for chest pain. (Patient not taking: Reported on 03/03/2018) 25 tablet 12  . SYMBICORT 160-4.5 MCG/ACT inhaler Take 2 puffs by mouth 2 (two) times daily.  3   No current facility-administered medications for this visit.     SURGICAL HISTORY:  Past Surgical History:  Procedure Laterality Date  . CARDIAC CATHETERIZATION  04/2008; 05/2008  . CARDIAC CATHETERIZATION N/A 08/07/2015   Procedure: Left Heart Cath and Coronary Angiography;  Surgeon: Sherren Mocha, MD;  Location: [facility];  Service: Cardiovascular;  Laterality: N/A;  . CHOLECYSTECTOMY    . CORONARY ANGIOPLASTY WITH STENT PLACEMENT  04/2008 12/06/2012   "1 + 1" (12/06/2012)  . CORONARY STENT PLACEMENT  05/08/2013   DES TO LAD      DR MCALHANY  . LEFT HEART CATHETERIZATION WITH CORONARY ANGIOGRAM N/A 12/06/2012   Procedure: LEFT HEART CATHETERIZATION WITH CORONARY ANGIOGRAM;  Surgeon: [name], MD;  Location: [facility] CATH LAB;  Service: Cardiovascular;  Laterality: N/A;  . LEFT HEART CATHETERIZATION WITH CORONARY ANGIOGRAM N/A 05/08/2013   Procedure: LEFT HEART CATHETERIZATION WITH CORONARY ANGIOGRAM;  Surgeon: [name], MD;  Location: [facility] CATH LAB;  Service: Cardiovascular;  Laterality:  N/A;  . LEFT HEART CATHETERIZATION WITH CORONARY ANGIOGRAM N/A 01/04/2014   Procedure: LEFT HEART CATHETERIZATION WITH CORONARY ANGIOGRAM;  Surgeon: [name], MD;  Location: [facility] CATH LAB;  Service: Cardiovascular;  Laterality: N/A;  . PERCUTANEOUS CORONARY STENT INTERVENTION (PCI-S)  12/06/2012   Procedure: PERCUTANEOUS CORONARY STENT INTERVENTION (PCI-S);  Surgeon: [name], MD;  Location: [facility] CATH LAB;  Service: Cardiovascular;;  . PERCUTANEOUS CORONARY STENT INTERVENTION (PCI-S)  05/08/2013   Procedure: PERCUTANEOUS CORONARY STENT INTERVENTION (PCI-S);  Surgeon: [name], MD;  Location: [facility] CATH LAB;  Service: Cardiovascular;;  mid LAD   . TUBAL LIGATION    . VAGINAL HYSTERECTOMY    . VIDEO BRONCHOSCOPY WITH ENDOBRONCHIAL NAVIGATION N/A 02/17/2018   Procedure: VIDEO BRONCHOSCOPY WITH ENDOBRONCHIAL NAVIGATION;  Surgeon: Melrose Nakayama, MD;  Location: [facility] OR;  Service: Thoracic;  Laterality: N/A;    REVIEW OF SYSTEMS:  Constitutional: positive for fatigue Eyes: negative Ears, nose, mouth, throat, and face: negative Respiratory: negative Cardiovascular: negative Gastrointestinal: negative Genitourinary:negative Integument/breast: negative Hematologic/lymphatic: negative Musculoskeletal:negative Neurological: negative Behavioral/Psych: negative Endocrine: negative Allergic/Immunologic: negative   PHYSICAL EXAMINATION: General appearance: alert, cooperative, fatigued and no distress Head: Normocephalic, without obvious abnormality, atraumatic Neck: no adenopathy, no JVD, supple, symmetrical, trachea midline and thyroid not enlarged, symmetric, no tenderness/mass/nodules Lymph nodes: Cervical, supraclavicular, and axillary nodes normal. Resp: clear to auscultation bilaterally Back: symmetric, no curvature. ROM normal. No CVA tenderness. Cardio: regular rate and rhythm, S1, S2 normal, no murmur, click, rub or gallop GI: soft, non-tender; bowel  sounds normal; no masses,  no organomegaly Extremities: extremities normal, atraumatic, no cyanosis or edema Neurologic: Alert and oriented X 3, normal strength and tone. Normal symmetric reflexes. Normal coordination and gait  ECOG PERFORMANCE STATUS: 1 - Symptomatic but completely ambulatory  Blood pressure (!) 140/95, pulse 62, temperature 99.6 F (37.6 C), temperature source Oral, resp. rate 20, height 5' 7"  (1.702 m), weight 166 lb 9.6 oz (75.6 kg), SpO2 94 %.  LABORATORY DATA: Lab Results  Component Value Date   WBC 10.0 02/16/2018   HGB 12.8 02/16/2018   HCT 40.9 02/16/2018   MCV 93.0 02/16/2018   PLT 247 02/16/2018      Chemistry      Component Value Date/Time   NA 141 02/16/2018 1525   NA 143 01/10/2018 1629   K 3.6 02/16/2018 1525   CL 102 02/16/2018 1525   CO2 29 02/16/2018 1525   BUN 17 02/16/2018 1525   BUN 14 01/10/2018 1629   CREATININE 0.78 02/16/2018 1525   CREATININE 0.68 08/05/2015 1456      Component Value Date/Time   CALCIUM 9.1 02/16/2018 1525   ALKPHOS 80 02/16/2018 1525   AST 42 (H) 02/16/2018 1525   ALT 55 (H) 02/16/2018 1525   BILITOT 0.8 02/16/2018 1525   BILITOT 0.3 02/01/2018 1539       RADIOGRAPHIC STUDIES: No results found.  ASSESSMENT AND PLAN: This is a very pleasant 63 years old white female recently diagnosed with a stage IV non-small cell lung cancer, adenocarcinoma with no actionable mutations and PDL 1 expression of 1% diagnosed in December 2019 and presented with bilateral pulmonary nodules. The patient had MRI of the brain performed recently that showed no evidence of metastatic disease to the brain. Her molecular studies were negative for any actionable mutations. I had a lengthy discussion with the patient today about her condition and treatment options.  I recommended for the patient systemic chemotherapy with carboplatin for AUC of 5, Alimta 500 mg/M2 and Keytruda 200 mg IV every 3 weeks. I discussed with the patient the  adverse effect of this treatment including but not limited to alopecia, myelosuppression, nausea and vomiting, peripheral neuropathy, liver or renal dysfunction as well as immunotherapy adverse effects. She is expected to start the first dose of this treatment on April 12, 2018. I will arrange for the patient to have a chemotherapy education class before the first dose of her treatment. The patient will receive vitamin B12 injection today. I will send prescription to her pharmacy with Compazine 10 mg p.o. every 6 hours as needed for nausea in addition to folic acid 1 mg p.o. daily. The patient will come back for follow-up visit in 2 weeks for evaluation and management of any adverse effect of her treatment. She was advised  to call immediately if she has any concerning symptoms in the interval. The patient voices understanding of current disease status and treatment options and is in agreement with the current care plan.  All questions were answered. The patient knows to call the clinic with any problems, questions or concerns. We can certainly see the patient much sooner if necessary.  I spent 15 minutes counseling the patient face to face. The total time spent in the appointment was 25 minutes.  Disclaimer: This note was dictated with voice recognition software. Similar sounding words can inadvertently be transcribed and may not be corrected upon review.

## 2018-04-05 NOTE — Progress Notes (Signed)
START ON PATHWAY REGIMEN - Non-Small Cell Lung     A cycle is every 21 days:     Pembrolizumab      Pemetrexed      Carboplatin   **Always confirm dose/schedule in your pharmacy ordering system**  Patient Characteristics: Stage IV Metastatic, Nonsquamous, Initial Chemotherapy/Immunotherapy, PS = 0, 1, ALK Translocation Negative/Unknown and EGFR Mutation Negative/Non-Sensitizing/Unknown, PD-L1 Expression Positive 1-49% (TPS) / Negative / Not Tested / Awaiting Test Results  and Immunotherapy Candidate AJCC T Category: T1a Current Disease Status: Distant Metastases AJCC N Category: N0 AJCC M Category: M1a AJCC 8 Stage Grouping: IVA Histology: Nonsquamous Cell ROS1 Rearrangement Status: Negative T790M Mutation Status: Not Applicable - EGFR Mutation Negative/Unknown Other Mutations/Biomarkers: No Other Actionable Mutations NTRK Gene Fusion Status: Negative PD-L1 Expression Status: PD-L1 Positive 1-49% (TPS) Chemotherapy/Immunotherapy LOT: Initial Chemotherapy/Immunotherapy Molecular Targeted Therapy: Not Appropriate ALK Translocation Status: Negative EGFR Mutation Status: Negative/Wild Type BRAF V600E Mutation Status: Negative Performance Status: PS = 0, 1 Immunotherapy Candidate Status: Candidate for Immunotherapy Intent of Therapy: Non-Curative / Palliative Intent, Discussed with Patient

## 2018-04-07 ENCOUNTER — Inpatient Hospital Stay: Payer: Managed Care, Other (non HMO)

## 2018-04-07 ENCOUNTER — Telehealth: Payer: Self-pay | Admitting: Medical Oncology

## 2018-04-07 ENCOUNTER — Other Ambulatory Visit: Payer: Self-pay | Admitting: Medical Oncology

## 2018-04-07 ENCOUNTER — Telehealth: Payer: Self-pay | Admitting: Internal Medicine

## 2018-04-07 ENCOUNTER — Telehealth: Payer: Self-pay | Admitting: *Deleted

## 2018-04-07 NOTE — Telephone Encounter (Signed)
Folic acid was covered under her insurance.

## 2018-04-07 NOTE — Telephone Encounter (Signed)
Called pt b/c she had no shown for Education appt.  She reports that she called @ 11 am & informed a gentleman that she needed to r/s due to the weather.  Message to schedulers to r/s.

## 2018-04-07 NOTE — Telephone Encounter (Signed)
R/s appt per 2/6 sch message - pt is aware of apt date and time

## 2018-04-09 ENCOUNTER — Other Ambulatory Visit: Payer: Self-pay | Admitting: Internal Medicine

## 2018-04-11 ENCOUNTER — Inpatient Hospital Stay: Payer: Managed Care, Other (non HMO)

## 2018-04-13 ENCOUNTER — Inpatient Hospital Stay: Payer: Managed Care, Other (non HMO)

## 2018-04-13 ENCOUNTER — Other Ambulatory Visit: Payer: Self-pay | Admitting: Internal Medicine

## 2018-04-13 ENCOUNTER — Encounter: Payer: Self-pay | Admitting: Internal Medicine

## 2018-04-13 VITALS — BP 140/87 | HR 71 | Temp 98.5°F | Resp 18 | Ht 67.0 in | Wt 168.8 lb

## 2018-04-13 DIAGNOSIS — C3492 Malignant neoplasm of unspecified part of left bronchus or lung: Secondary | ICD-10-CM | POA: Diagnosis not present

## 2018-04-13 DIAGNOSIS — R5382 Chronic fatigue, unspecified: Secondary | ICD-10-CM

## 2018-04-13 LAB — CMP (CANCER CENTER ONLY)
ALT: 23 U/L (ref 0–44)
AST: 25 U/L (ref 15–41)
Albumin: 3.5 g/dL (ref 3.5–5.0)
Alkaline Phosphatase: 92 U/L (ref 38–126)
Anion gap: 9 (ref 5–15)
BUN: 12 mg/dL (ref 8–23)
CO2: 25 mmol/L (ref 22–32)
Calcium: 8.8 mg/dL — ABNORMAL LOW (ref 8.9–10.3)
Chloride: 107 mmol/L (ref 98–111)
Creatinine: 0.76 mg/dL (ref 0.44–1.00)
GFR, Est AFR Am: >60 mL/min
GFR, Estimated: >60 mL/min
Glucose, Bld: 109 mg/dL — ABNORMAL HIGH (ref 70–99)
Potassium: 4 mmol/L (ref 3.5–5.1)
Sodium: 141 mmol/L (ref 135–145)
Total Bilirubin: 0.6 mg/dL (ref 0.3–1.2)
Total Protein: 6.5 g/dL (ref 6.5–8.1)

## 2018-04-13 LAB — CBC WITH DIFFERENTIAL (CANCER CENTER ONLY)
ABS IMMATURE GRANULOCYTES: 0.01 10*3/uL (ref 0.00–0.07)
Basophils Absolute: 0.1 10*3/uL (ref 0.0–0.1)
Basophils Relative: 1 %
Eosinophils Absolute: 0.3 10*3/uL (ref 0.0–0.5)
Eosinophils Relative: 3 %
HCT: 40.5 % (ref 36.0–46.0)
Hemoglobin: 13.3 g/dL (ref 12.0–15.0)
Immature Granulocytes: 0 %
Lymphocytes Relative: 28 %
Lymphs Abs: 2.7 10*3/uL (ref 0.7–4.0)
MCH: 28.9 pg (ref 26.0–34.0)
MCHC: 32.8 g/dL (ref 30.0–36.0)
MCV: 87.9 fL (ref 80.0–100.0)
Monocytes Absolute: 0.9 10*3/uL (ref 0.1–1.0)
Monocytes Relative: 9 %
Neutro Abs: 5.6 10*3/uL (ref 1.7–7.7)
Neutrophils Relative %: 59 %
Platelet Count: 258 10*3/uL (ref 150–400)
RBC: 4.61 MIL/uL (ref 3.87–5.11)
RDW: 12.6 % (ref 11.5–15.5)
WBC Count: 9.5 10*3/uL (ref 4.0–10.5)
nRBC: 0 % (ref 0.0–0.2)

## 2018-04-13 LAB — TSH: TSH: 7.076 u[IU]/mL — ABNORMAL HIGH (ref 0.308–3.960)

## 2018-04-13 MED ORDER — SODIUM CHLORIDE 0.9 % IV SOLN
Freq: Once | INTRAVENOUS | Status: AC
  Administered 2018-04-13: 09:00:00 via INTRAVENOUS
  Filled 2018-04-13: qty 250

## 2018-04-13 MED ORDER — PALONOSETRON HCL INJECTION 0.25 MG/5ML
INTRAVENOUS | Status: AC
  Filled 2018-04-13: qty 5

## 2018-04-13 MED ORDER — SODIUM CHLORIDE 0.9 % IV SOLN
525.0000 mg/m2 | Freq: Once | INTRAVENOUS | Status: AC
  Administered 2018-04-13: 1000 mg via INTRAVENOUS
  Filled 2018-04-13: qty 40

## 2018-04-13 MED ORDER — SODIUM CHLORIDE 0.9 % IV SOLN
200.0000 mg | Freq: Once | INTRAVENOUS | Status: AC
  Administered 2018-04-13: 200 mg via INTRAVENOUS
  Filled 2018-04-13: qty 8

## 2018-04-13 MED ORDER — SODIUM CHLORIDE 0.9 % IV SOLN
Freq: Once | INTRAVENOUS | Status: AC
  Administered 2018-04-13: 10:00:00 via INTRAVENOUS
  Filled 2018-04-13: qty 5

## 2018-04-13 MED ORDER — SODIUM CHLORIDE 0.9 % IV SOLN
554.5000 mg | Freq: Once | INTRAVENOUS | Status: AC
  Administered 2018-04-13: 550 mg via INTRAVENOUS
  Filled 2018-04-13: qty 55

## 2018-04-13 MED ORDER — PALONOSETRON HCL INJECTION 0.25 MG/5ML
0.2500 mg | Freq: Once | INTRAVENOUS | Status: AC
  Administered 2018-04-13: 0.25 mg via INTRAVENOUS

## 2018-04-13 NOTE — Progress Notes (Signed)
Called patient to introduce myself as Arboriculturist and to offer available resources.  Will discuss one-time $24 [place] and qualifications to assist with personal expenses while going through treatment.  Will also discuss available copay assistance for Keytruda through [employer].  Left my contact name and number on voicemail for her to return my call.

## 2018-04-13 NOTE — Patient Instructions (Signed)
[facility] Discharge Instructions for Patients Receiving Chemotherapy  Today you received the following chemotherapy agents Pembrolizumab (KEYTRUDA), Pemetrexed (ALIMTA) & Carboplatin (PARAPLATIN).  To help prevent nausea and vomiting after your treatment, we encourage you to take your nausea medication as prescribed.   If you develop nausea and vomiting that is not controlled by your nausea medication, call the clinic.   BELOW ARE SYMPTOMS THAT SHOULD BE REPORTED IMMEDIATELY:  *FEVER GREATER THAN 100.5 F  *CHILLS WITH OR WITHOUT FEVER  NAUSEA AND VOMITING THAT IS NOT CONTROLLED WITH YOUR NAUSEA MEDICATION  *UNUSUAL SHORTNESS OF BREATH  *UNUSUAL BRUISING OR BLEEDING  TENDERNESS IN MOUTH AND THROAT WITH OR WITHOUT PRESENCE OF ULCERS  *URINARY PROBLEMS  *BOWEL PROBLEMS  UNUSUAL RASH Items with * indicate a potential emergency and should be followed up as soon as possible.  Feel free to call the clinic should you have any questions or concerns. The clinic phone number is [phone].  Please show the [facility] at check-in to the Emergency Department and triage nurse.  Pembrolizumab injection What is this medicine? PEMBROLIZUMAB (pem broe liz ue mab) is a monoclonal antibody. It is used to treat cervical cancer, esophageal cancer, head and neck cancer, hepatocellular cancer, Hodgkin lymphoma, kidney cancer, lymphoma, melanoma, Merkel cell carcinoma, lung cancer, stomach cancer, urothelial cancer, and cancers that have a certain genetic condition. This medicine may be used for other purposes; ask your health care provider or pharmacist if you have questions. COMMON BRAND NAME(S): Keytruda What should I tell my health care provider before I take this medicine? They need to know if you have any of these conditions: -diabetes -immune system problems -inflammatory bowel disease -liver disease -lung or breathing disease -lupus -received or scheduled to  receive an organ transplant or a stem-cell transplant that uses donor stem cells -an unusual or allergic reaction to pembrolizumab, other medicines, foods, dyes, or preservatives -pregnant or trying to get pregnant -breast-feeding How should I use this medicine? This medicine is for infusion into a vein. It is given by a health care professional in a hospital or clinic setting. A special MedGuide will be given to you before each treatment. Be sure to read this information carefully each time. Talk to your pediatrician regarding the use of this medicine in children. While this drug may be prescribed for selected conditions, precautions do apply. Overdosage: If you think you have taken too much of this medicine contact a poison control center or emergency room at once. NOTE: This medicine is only for you. Do not share this medicine with others. What if I miss a dose? It is important not to miss your dose. Call your doctor or health care professional if you are unable to keep an appointment. What may interact with this medicine? Interactions have not been studied. Give your health care provider a list of all the medicines, herbs, non-prescription drugs, or dietary supplements you use. Also tell them if you smoke, drink alcohol, or use illegal drugs. Some items may interact with your medicine. This list may not describe all possible interactions. Give your health care provider a list of all the medicines, herbs, non-prescription drugs, or dietary supplements you use. Also tell them if you smoke, drink alcohol, or use illegal drugs. Some items may interact with your medicine. What should I watch for while using this medicine? Your condition will be monitored carefully while you are receiving this medicine. You may need blood work done while you are taking this  medicine. Do not become pregnant while taking this medicine or for 4 months after stopping it. Women should inform their doctor if they wish to  become pregnant or think they might be pregnant. There is a potential for serious side effects to an unborn child. Talk to your health care professional or pharmacist for more information. Do not breast-feed an infant while taking this medicine or for 4 months after the last dose. What side effects may I notice from receiving this medicine? Side effects that you should report to your doctor or health care professional as soon as possible: -allergic reactions like skin rash, itching or hives, swelling of the face, lips, or tongue -bloody or black, tarry -breathing problems -changes in vision -chest pain -chills -confusion -constipation -cough -diarrhea -dizziness or feeling faint or lightheaded -fast or irregular heartbeat -fever -flushing -hair loss -joint pain -low blood counts - this medicine may decrease the number of white blood cells, red blood cells and platelets. You may be at increased risk for infections and bleeding. -muscle pain -muscle weakness -persistent headache -redness, blistering, peeling or loosening of the skin, including inside the mouth -signs and symptoms of high blood sugar such as dizziness; dry mouth; dry skin; fruity breath; nausea; stomach pain; increased hunger or thirst; increased urination -signs and symptoms of kidney injury like trouble passing urine or change in the amount of urine -signs and symptoms of liver injury like dark urine, light-colored stools, loss of appetite, nausea, right upper belly pain, yellowing of the eyes or skin -sweating -swollen lymph nodes -weight loss Side effects that usually do not require medical attention (report to your doctor or health care professional if they continue or are bothersome): -decreased appetite -muscle pain -tiredness This list may not describe all possible side effects. Call your doctor for medical advice about side effects. You may report side effects to FDA at 1-800-FDA-1088. Where should I keep my  medicine? This drug is given in a hospital or clinic and will not be stored at home. NOTE: This sheet is a summary. It may not cover all possible information. If you have questions about this medicine, talk to your doctor, pharmacist, or health care provider.  2019 [employer] Standard (2017-09-30 15:06:10)  Pemetrexed injection What is this medicine? PEMETREXED (PEM e TREX ed) is a chemotherapy drug used to treat lung cancers like non-small cell lung cancer and mesothelioma. It may also be used to treat other cancers. This medicine may be used for other purposes; ask your health care provider or pharmacist if you have questions. COMMON BRAND NAME(S): Alimta What should I tell my health care provider before I take this medicine? They need to know if you have any of these conditions: -infection (especially a virus infection such as chickenpox, cold sores, or herpes) -kidney disease -low blood counts, like low white cell, platelet, or red cell counts -lung or breathing disease, like asthma -radiation therapy -an unusual or allergic reaction to pemetrexed, other medicines, foods, dyes, or preservative -pregnant or trying to get pregnant -breast-feeding How should I use this medicine? This drug is given as an infusion into a vein. It is administered in a hospital or clinic by a specially trained health care professional. Talk to your pediatrician regarding the use of this medicine in children. Special care may be needed. Overdosage: If you think you have taken too much of this medicine contact a poison control center or emergency room at once. NOTE: This medicine is only for you. Do not share this  medicine with others. What if I miss a dose? It is important not to miss your dose. Call your doctor or health care professional if you are unable to keep an appointment. What may interact with this medicine? This medicine may interact with the following medications: -Ibuprofen This list may  not describe all possible interactions. Give your health care provider a list of all the medicines, herbs, non-prescription drugs, or dietary supplements you use. Also tell them if you smoke, drink alcohol, or use illegal drugs. Some items may interact with your medicine. What should I watch for while using this medicine? Visit your doctor for checks on your progress. This drug may make you feel generally unwell. This is not uncommon, as chemotherapy can affect healthy cells as well as cancer cells. Report any side effects. Continue your course of treatment even though you feel ill unless your doctor tells you to stop. In some cases, you may be given additional medicines to help with side effects. Follow all directions for their use. Call your doctor or health care professional for advice if you get a fever, chills or sore throat, or other symptoms of a cold or flu. Do not treat yourself. This drug decreases your body's ability to fight infections. Try to avoid being around people who are sick. This medicine may increase your risk to bruise or bleed. Call your doctor or health care professional if you notice any unusual bleeding. Be careful brushing and flossing your teeth or using a toothpick because you may get an infection or bleed more easily. If you have any dental work done, tell your dentist you are receiving this medicine. Avoid taking products that contain aspirin, acetaminophen, ibuprofen, naproxen, or ketoprofen unless instructed by your doctor. These medicines may hide a fever. Call your doctor or health care professional if you get diarrhea or mouth sores. Do not treat yourself. To protect your kidneys, drink water or other fluids as directed while you are taking this medicine. Do not become pregnant while taking this medicine or for 6 months after stopping it. Women should inform their doctor if they wish to become pregnant or think they might be pregnant. Men should not father a child while  taking this medicine and for 3 months after stopping it. This may interfere with the ability to father a child. You should talk to your doctor or health care professional if you are concerned about your fertility. There is a potential for serious side effects to an unborn child. Talk to your health care professional or pharmacist for more information. Do not breast-feed an infant while taking this medicine or for 1 week after stopping it. What side effects may I notice from receiving this medicine? Side effects that you should report to your doctor or health care professional as soon as possible: -allergic reactions like skin rash, itching or hives, swelling of the face, lips, or tongue -breathing problems -redness, blistering, peeling or loosening of the skin, including inside the mouth -signs and symptoms of bleeding such as bloody or black, tarry stools; red or dark-brown urine; spitting up blood or brown material that looks like coffee grounds; red spots on the skin; unusual bruising or bleeding from the eye, gums, or nose -signs and symptoms of infection like fever or chills; cough; sore throat; pain or trouble passing urine -signs and symptoms of kidney injury like trouble passing urine or change in the amount of urine -signs and symptoms of liver injury like dark yellow or brown urine; general  ill feeling or flu-like symptoms; light-colored stools; loss of appetite; nausea; right upper belly pain; unusually weak or tired; yellowing of the eyes or skin Side effects that usually do not require medical attention (report to your doctor or health care professional if they continue or are bothersome): -constipation -mouth sores -nausea, vomiting -unusually weak or tired This list may not describe all possible side effects. Call your doctor for medical advice about side effects. You may report side effects to FDA at 1-800-FDA-1088. Where should I keep my medicine? This drug is given in a hospital or  clinic and will not be stored at home. NOTE: This sheet is a summary. It may not cover all possible information. If you have questions about this medicine, talk to your doctor, pharmacist, or health care provider.  2019 [employer]/Gold Standard (2017-04-07 16:11:33)  Carboplatin injection What is this medicine? CARBOPLATIN (KAR boe pla tin) is a chemotherapy drug. It targets fast dividing cells, like cancer cells, and causes these cells to die. This medicine is used to treat ovarian cancer and many other cancers. This medicine may be used for other purposes; ask your health care provider or pharmacist if you have questions. COMMON BRAND NAME(S): Paraplatin What should I tell my health care provider before I take this medicine? They need to know if you have any of these conditions: -blood disorders -hearing problems -kidney disease -recent or ongoing radiation therapy -an unusual or allergic reaction to carboplatin, cisplatin, other chemotherapy, other medicines, foods, dyes, or preservatives -pregnant or trying to get pregnant -breast-feeding How should I use this medicine? This drug is usually given as an infusion into a vein. It is administered in a hospital or clinic by a specially trained health care professional. Talk to your pediatrician regarding the use of this medicine in children. Special care may be needed. Overdosage: If you think you have taken too much of this medicine contact a poison control center or emergency room at once. NOTE: This medicine is only for you. Do not share this medicine with others. What if I miss a dose? It is important not to miss a dose. Call your doctor or health care professional if you are unable to keep an appointment. What may interact with this medicine? -medicines for seizures -medicines to increase blood counts like filgrastim, pegfilgrastim, sargramostim -some antibiotics like amikacin, gentamicin, neomycin, streptomycin,  tobramycin -vaccines Talk to your doctor or health care professional before taking any of these medicines: -acetaminophen -aspirin -ibuprofen -ketoprofen -naproxen This list may not describe all possible interactions. Give your health care provider a list of all the medicines, herbs, non-prescription drugs, or dietary supplements you use. Also tell them if you smoke, drink alcohol, or use illegal drugs. Some items may interact with your medicine. What should I watch for while using this medicine? Your condition will be monitored carefully while you are receiving this medicine. You will need important blood work done while you are taking this medicine. This drug may make you feel generally unwell. This is not uncommon, as chemotherapy can affect healthy cells as well as cancer cells. Report any side effects. Continue your course of treatment even though you feel ill unless your doctor tells you to stop. In some cases, you may be given additional medicines to help with side effects. Follow all directions for their use. Call your doctor or health care professional for advice if you get a fever, chills or sore throat, or other symptoms of a cold or flu. Do not treat yourself.  This drug decreases your body's ability to fight infections. Try to avoid being around people who are sick. This medicine may increase your risk to bruise or bleed. Call your doctor or health care professional if you notice any unusual bleeding. Be careful brushing and flossing your teeth or using a toothpick because you may get an infection or bleed more easily. If you have any dental work done, tell your dentist you are receiving this medicine. Avoid taking products that contain aspirin, acetaminophen, ibuprofen, naproxen, or ketoprofen unless instructed by your doctor. These medicines may hide a fever. Do not become pregnant while taking this medicine. Women should inform their doctor if they wish to become pregnant or think  they might be pregnant. There is a potential for serious side effects to an unborn child. Talk to your health care professional or pharmacist for more information. Do not breast-feed an infant while taking this medicine. What side effects may I notice from receiving this medicine? Side effects that you should report to your doctor or health care professional as soon as possible: -allergic reactions like skin rash, itching or hives, swelling of the face, lips, or tongue -signs of infection - fever or chills, cough, sore throat, pain or difficulty passing urine -signs of decreased platelets or bleeding - bruising, pinpoint red spots on the skin, black, tarry stools, nosebleeds -signs of decreased red blood cells - unusually weak or tired, fainting spells, lightheadedness -breathing problems -changes in hearing -changes in vision -chest pain -high blood pressure -low blood counts - This drug may decrease the number of white blood cells, red blood cells and platelets. You may be at increased risk for infections and bleeding. -nausea and vomiting -pain, swelling, redness or irritation at the injection site -pain, tingling, numbness in the hands or feet -problems with balance, talking, walking -trouble passing urine or change in the amount of urine Side effects that usually do not require medical attention (report to your doctor or health care professional if they continue or are bothersome): -hair loss -loss of appetite -metallic taste in the mouth or changes in taste This list may not describe all possible side effects. Call your doctor for medical advice about side effects. You may report side effects to FDA at 1-800-FDA-1088. Where should I keep my medicine? This drug is given in a hospital or clinic and will not be stored at home. NOTE: This sheet is a summary. It may not cover all possible information. If you have questions about this medicine, talk to your doctor, pharmacist, or health care  provider.  2019 [employer]/Gold Standard (2007-05-24 14:38:05)

## 2018-04-15 ENCOUNTER — Telehealth: Payer: Self-pay | Admitting: Medical Oncology

## 2018-04-15 NOTE — Telephone Encounter (Signed)
LVM to call back if any problems ,side effects from tx.

## 2018-04-18 NOTE — Progress Notes (Deleted)
Cardiology Office Note    Date:  04/19/2018  ID:  [name], DOB 12/21/1955, MRN [id number] PCP:  [name], NP  Cardiologist:  Lauree Chandler, MD   Chief Complaint: f/u cardiac testing  History of Present Illness:  [name] is a 63 y.o. female with history of CAD s/p multiple prior stents as below, PVCs, PACs, SVT, asthma, depression, HTN, hyperlipidemia, GERD, fibromyalgia, remote anemia, hiatal hernia who presents for f/u.   To recap, LHC 04/2008 demonstrated 80% stenosis in the mid RCA treated with a Xience DES. Re-look LHC 05/2008 showed proximal RCA 25%, mid RCA stent patent. She was previously noted to have possible SVT during cardiac rehabilitation 05/2008. Holter 03/2009: NSR, PACs and PVCs. Liver biopsy 2011 reportedly normal (steatohepatitis). Stress myoview on 11/21/12 showed a reversible basal inferior perfusion defect prompting cath 12/06/12 showing severe stenosis in the mid RCA just before the old stent which also had restenosis -> entire segment treated with a 2.75 x 28 mm Promus Premier DES. She had recurrent CP in 04/2013 with cath showing severe stenosis mid LAD s/p DES. She called our office in August 2015 c/o tachycardia, heart racing. Lopressor 12.5 mg po BID started. 24 hour monitor November 2015 with PACs/PVCs and 4 beat run SVT. Last cath in 08/2015 done for chest pain and SOB showed stable CAD with continued patency, 20% mRCA, 50% ostial RPDA, 10% prox-mid LAD, LVEF normal. Due to statin intolerance/elevated LFTs she's also been followed in the lipid clinic. When I met her in 09/2016 she was reporting some SOB. BNP was normal. Amlodipine was started (unable to tolerate higher doses of BB due to fatigue). Nuclear stress test 09/2016 was unremarkable, EF 48% but f/u echo confirmed normal EF 55-60%.   More recently she saw Lyda Jester PA-C 01/10/18 with exertional dypnea and palpitations. EKG showed sinus tachycardia. Labs showed Hgb 15.1, mild  leukocytosis similar to prior, normal pro BNP, Cr 0.70, K 3.9, CO2 30 (mildly elevated), LDL 198, LFTs ok except ALT 53 (intermittently elevated), TSH 5.170, abnormal d-dimer. Nuclear stress test 01/20/18 was normal, EF 59%. Monitor showed rare PVCs, frequent PACs, several short runs of SVT, NSR. CT angio to rule out PE showed no acute PE but the report did state, "Bilateral ground-glass opacities and sub solid nodules have increased in prominence compared to the prior study dated 2010. Findings may represent an inflammatory process, however slow-growing malignancy such as adenocarcinoma is not excluded. Consider multidisciplinary thoracic oncology conference discussion for further management." She was subsequently referred to Dr. Roxan [name]. On February 17, 2018 the patient underwent electromagnetic navigational bronchoscopy with needle aspiration, brushing and transbronchial biopsies by Dr. Roxan [name]. 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    i met in 09/2016  Lipid status? fam hx    Shortness of breath  CAD  Essential HTN  Hyperlipidemia  SVT/PACs/PVCs     Past Medical History:  Diagnosis Date  . Anemia    "long time ago" (12/06/2012)  . Arthritis    "right knee real bad; in my hands bad" (12/06/2012)  . Asthma   . Coronary artery disease    a. s/p Xience DES to [facility] Fall 04/2008;  b. LHC 05/2008: Proximal RCA 25%, mid RCA stent patent.  c. Anormal nuc 2014 -> s/p LHC with severe mRCA stenosis s/p DES. d. Cath 04/2013 s/p DES to LAD.  e. LHC 08/2015 was stable.  Marland Kitchen  Depression    "years ago" (12/06/2012)  . Fibromyalgia   . GERD (gastroesophageal reflux disease)   . H/O hiatal hernia   . Hepatitis    "not A, B, or C" (12/06/2012)  . Hyperlipidemia    intol to statins and other chol agents due to elevated LFTs  . Hypertension   . Lung nodules    Bilateral  . Migraines    "when  I was younger" (12/06/2012)  . PONV (postoperative nausea and vomiting)    "Very bad"  . Premature atrial contractions   . PVC's (premature ventricular contractions)   . Sinus headache   . SVT (supraventricular tachycardia) (HCC)     Past Surgical History:  Procedure Laterality Date  . CARDIAC CATHETERIZATION  04/2008; 05/2008  . CARDIAC CATHETERIZATION N/A 08/07/2015   Procedure: Left Heart Cath and Coronary Angiography;  Surgeon: Sherren Mocha, MD;  Location: [facility];  Service: Cardiovascular;  Laterality: N/A;  . CHOLECYSTECTOMY    . CORONARY ANGIOPLASTY WITH STENT PLACEMENT  04/2008 12/06/2012   "1 + 1" (12/06/2012)  . CORONARY STENT PLACEMENT  05/08/2013   DES TO LAD      DR MCALHANY  . LEFT HEART CATHETERIZATION WITH CORONARY ANGIOGRAM N/A 12/06/2012   Procedure: LEFT HEART CATHETERIZATION WITH CORONARY ANGIOGRAM;  Surgeon: [name], MD;  Location: [facility] CATH LAB;  Service: Cardiovascular;  Laterality: N/A;  . LEFT HEART CATHETERIZATION WITH CORONARY ANGIOGRAM N/A 05/08/2013   Procedure: LEFT HEART CATHETERIZATION WITH CORONARY ANGIOGRAM;  Surgeon: [name], MD;  Location: [facility] CATH LAB;  Service: Cardiovascular;  Laterality: N/A;  . LEFT HEART CATHETERIZATION WITH CORONARY ANGIOGRAM N/A 01/04/2014   Procedure: LEFT HEART CATHETERIZATION WITH CORONARY ANGIOGRAM;  Surgeon: [name], MD;  Location: [facility] C[facility] LAB;  Service: Cardiovascular;  Laterality: N/A;  . PERCUTANEOUS CORONARY STENT INTERVENTION (PCI-S)  12/06/2012   Procedure: PERCUTANEOUS CORONARY STENT INTERVENTION (PCI-S);  Surgeon: [name], MD;  Location: [facility] CATH LAB;  Service: Cardiovascular;;  . PERCUTANEOUS CORONARY STENT INTERVENTION (PCI-S)  05/08/2013   Procedure: PERCUTANEOUS CORONARY STENT INTERVENTION (PCI-S);  Surgeon: [name], MD;  Location: [facility] CATH LAB;  Service: Cardiovascular;;  mid LAD   . TUBAL LIGATION    . VAGINAL HYSTERECTOMY    . VIDEO  BRONCHOSCOPY WITH ENDOBRONCHIAL NAVIGATION N/A 02/17/2018   Procedure: VIDEO BRONCHOSCOPY WITH ENDOBRONCHIAL NAVIGATION;  Surgeon: Melrose Nakayama, MD;  Location: [place];  Service: Thoracic;  Laterality: N/A;    Current Medications: No outpatient medications have been marked as taking for the 04/19/18 encounter (Appointment) with Charlie Pitter, PA-C.   ***   Allergies:   Ciprofloxacin; Levofloxacin; Statins; Tricor [fenofibrate]; Latex; Codeine; and Metoprolol   Social History   Socioeconomic History  . Marital status: Married    Spouse name: Not on file  . Number of children: Not on file  . Years of education: Not on file  . Highest education level: Not on file  Occupational History  . Not on file  Social Needs  . Financial resource strain: Not on file  . Food insecurity:    Worry: Not on file    Inability: Not on file  . Transportation needs:    Medical: Not on file    Non-medical: Not on file  Tobacco Use  . Smoking status: Former Smoker    Packs/day: 0.50    Years: 30.00    Pack years: 15.00    Types: Cigarettes  . Smokeless tobacco: Never Used  .  Tobacco comment: 12/06/2012 "quit smoking cigarettes ~ 8 yr ago"  Substance and Sexual Activity  . Alcohol use: No  . Drug use: No  . Sexual activity: Yes  Lifestyle  . Physical activity:    Days per week: Not on file    Minutes per session: Not on file  . Stress: Not on file  Relationships  . Social connections:    Talks on phone: Not on file    Gets together: Not on file    Attends religious service: Not on file    Active member of club or organization: Not on file    Attends meetings of clubs or organizations: Not on file    Relationship status: Not on file  Other Topics Concern  . Not on file  Social History Narrative  . Not on file     Family History:  The patient's ***family history is negative for CAD.  ROS:   Please see the history of present illness. Otherwise, review of systems is positive for  ***.  All other systems are reviewed and otherwise negative.    PHYSICAL EXAM:   VS:  There were no vitals taken for this visit.  BMI: There is no height or weight on file to calculate BMI. GEN: Well nourished, well developed, in no acute distress HEENT: normocephalic, atraumatic Neck: no JVD, carotid bruits, or masses Cardiac: ***RRR; no murmurs, rubs, or gallops, no edema  Respiratory:  clear to auscultation bilaterally, normal work of breathing GI: soft, nontender, nondistended, + BS MS: no deformity or atrophy Skin: warm and dry, no rash Neuro:  Alert and Oriented x 3, Strength and sensation are intact, follows commands Psych: euthymic mood, full affect  Wt Readings from Last 3 Encounters:  04/13/18 168 lb 12.8 oz (76.6 kg)  04/05/18 166 lb 9.6 oz (75.6 kg)  03/03/18 163 lb 12.8 oz (74.3 kg)      Studies/Labs Reviewed:   EKG:  EKG was ordered today and personally reviewed by me and demonstrates *** EKG was not ordered today.***  Recent Labs: 01/10/2018: NT-Pro BNP 69 04/13/2018: ALT 23; BUN 12; Creatinine 0.76; Hemoglobin 13.3; Platelet Count 258; Potassium 4.0; Sodium 141; TSH 7.076   Lipid Panel    Component Value Date/Time   CHOL 284 (H) 02/01/2018 1539   TRIG 248 (H) 02/01/2018 1539   HDL 51 02/01/2018 1539   CHOLHDL 5.6 (H) 02/01/2018 1539   CHOLHDL 4.2 08/22/2015 1408   VLDL 37 (H) 08/22/2015 1408   LDLCALC 183 (H) 02/01/2018 1539   LDLDIRECT 198 (H) 02/01/2018 1539   LDLDIRECT 186 (H) 08/22/2015 1408    Additional studies/ records that were reviewed today include: Summarized above.***    ASSESSMENT & PLAN:   1. ***  Disposition: F/u with ***   Medication Adjustments/Labs and Tests Ordered: Current medicines are reviewed at length with the patient today.  Concerns regarding medicines are outlined above. Medication changes, Labs and Tests ordered today are summarized above and listed in the Patient Instructions accessible in Encounters.    Signed, Charlie Pitter, PA-C  04/19/2018 7:57 AM    [address], [place], [place]  [address] Phone: [phone]; Fax: [phone]

## 2018-04-19 ENCOUNTER — Encounter: Payer: Self-pay | Admitting: Physician Assistant

## 2018-04-19 ENCOUNTER — Ambulatory Visit: Payer: Managed Care, Other (non HMO) | Admitting: Physician Assistant

## 2018-04-19 ENCOUNTER — Telehealth: Payer: Self-pay | Admitting: Physician Assistant

## 2018-04-19 ENCOUNTER — Inpatient Hospital Stay (HOSPITAL_BASED_OUTPATIENT_CLINIC_OR_DEPARTMENT_OTHER): Payer: Managed Care, Other (non HMO) | Admitting: Physician Assistant

## 2018-04-19 ENCOUNTER — Inpatient Hospital Stay: Payer: Managed Care, Other (non HMO)

## 2018-04-19 ENCOUNTER — Other Ambulatory Visit: Payer: Self-pay | Admitting: Internal Medicine

## 2018-04-19 ENCOUNTER — Other Ambulatory Visit: Payer: Self-pay

## 2018-04-19 VITALS — BP 145/93 | HR 117 | Temp 98.4°F | Resp 18 | Ht 67.0 in | Wt 167.8 lb

## 2018-04-19 DIAGNOSIS — R5383 Other fatigue: Secondary | ICD-10-CM | POA: Diagnosis not present

## 2018-04-19 DIAGNOSIS — I10 Essential (primary) hypertension: Secondary | ICD-10-CM

## 2018-04-19 DIAGNOSIS — I2510 Atherosclerotic heart disease of native coronary artery without angina pectoris: Secondary | ICD-10-CM

## 2018-04-19 DIAGNOSIS — C3492 Malignant neoplasm of unspecified part of left bronchus or lung: Secondary | ICD-10-CM

## 2018-04-19 DIAGNOSIS — M797 Fibromyalgia: Secondary | ICD-10-CM

## 2018-04-19 DIAGNOSIS — E785 Hyperlipidemia, unspecified: Secondary | ICD-10-CM

## 2018-04-19 DIAGNOSIS — K219 Gastro-esophageal reflux disease without esophagitis: Secondary | ICD-10-CM

## 2018-04-19 DIAGNOSIS — Z7982 Long term (current) use of aspirin: Secondary | ICD-10-CM

## 2018-04-19 DIAGNOSIS — J069 Acute upper respiratory infection, unspecified: Secondary | ICD-10-CM | POA: Diagnosis not present

## 2018-04-19 DIAGNOSIS — G479 Sleep disorder, unspecified: Secondary | ICD-10-CM

## 2018-04-19 DIAGNOSIS — Z79899 Other long term (current) drug therapy: Secondary | ICD-10-CM

## 2018-04-19 DIAGNOSIS — Z7902 Long term (current) use of antithrombotics/antiplatelets: Secondary | ICD-10-CM

## 2018-04-19 DIAGNOSIS — M1990 Unspecified osteoarthritis, unspecified site: Secondary | ICD-10-CM

## 2018-04-19 LAB — CMP (CANCER CENTER ONLY)
ALT: 22 U/L (ref 0–44)
AST: 18 U/L (ref 15–41)
Albumin: 3.7 g/dL (ref 3.5–5.0)
Alkaline Phosphatase: 91 U/L (ref 38–126)
Anion gap: 11 (ref 5–15)
BUN: 21 mg/dL (ref 8–23)
CALCIUM: 8.9 mg/dL (ref 8.9–10.3)
CO2: 25 mmol/L (ref 22–32)
Chloride: 103 mmol/L (ref 98–111)
Creatinine: 1.17 mg/dL — ABNORMAL HIGH (ref 0.44–1.00)
GFR, Est AFR Am: 57 mL/min — ABNORMAL LOW (ref >60)
GFR, Estimated: 50 mL/min — ABNORMAL LOW (ref >60)
Glucose, Bld: 147 mg/dL — ABNORMAL HIGH (ref 70–99)
Potassium: 4.6 mmol/L (ref 3.5–5.1)
Sodium: 139 mmol/L (ref 135–145)
Total Bilirubin: 0.5 mg/dL (ref 0.3–1.2)
Total Protein: 6.7 g/dL (ref 6.5–8.1)

## 2018-04-19 LAB — CBC WITH DIFFERENTIAL (CANCER CENTER ONLY)
Abs Immature Granulocytes: 0.01 10*3/uL (ref 0.00–0.07)
Basophils Absolute: 0 10*3/uL (ref 0.0–0.1)
Basophils Relative: 0 %
Eosinophils Absolute: 0.5 10*3/uL (ref 0.0–0.5)
Eosinophils Relative: 8 %
HCT: 41.9 % (ref 36.0–46.0)
Hemoglobin: 13.9 g/dL (ref 12.0–15.0)
IMMATURE GRANULOCYTES: 0 %
Lymphocytes Relative: 25 %
Lymphs Abs: 1.6 10*3/uL (ref 0.7–4.0)
MCH: 28.9 pg (ref 26.0–34.0)
MCHC: 33.2 g/dL (ref 30.0–36.0)
MCV: 87.1 fL (ref 80.0–100.0)
Monocytes Absolute: 0.2 10*3/uL (ref 0.1–1.0)
Monocytes Relative: 4 %
NEUTROS PCT: 63 %
Neutro Abs: 4 10*3/uL (ref 1.7–7.7)
Platelet Count: 207 10*3/uL (ref 150–400)
RBC: 4.81 MIL/uL (ref 3.87–5.11)
RDW: 12.3 % (ref 11.5–15.5)
WBC Count: 6.3 10*3/uL (ref 4.0–10.5)
nRBC: 0 % (ref 0.0–0.2)

## 2018-04-19 MED ORDER — LIDOCAINE-PRILOCAINE 2.5-2.5 % EX CREA: 1.0000 "application " | TOPICAL_CREAM | CUTANEOUS | 0 refills | Status: DC | PRN

## 2018-04-19 NOTE — Telephone Encounter (Signed)
Scheduled appt per 2/18 los.  Printed calendar and avs.

## 2018-04-19 NOTE — Progress Notes (Signed)
[facility] OFFICE PROGRESS NOTE  [name], NP [place] [id number]  DIAGNOSIS: stage IV (T1a, N0, M1 a) non-small cell lung cancer, adenocarcinoma presented with multifocal disease in both lungs including 3 nodules in the left lung as well as 2 nodules in the right lung diagnosed in December 2019.  No actionable mutations KRAS G12C BCOR N1425S-subclonal RAD21 amplification  PDL 1 expression 1%.  PRIOR THERAPY: None   CURRENT THERAPY: Systemic chemotherapy with carboplatin for AUC of 5, Alimta 500 mg/M2 and Keytruda 200 mg IV every 3 weeks.  First dose April 13, 2018. Status post cycle #1.   INTERVAL HISTORY: [name] 63 y.o. female returns to the clinic today for a follow-up visit accompanied by her friend.  The patient tolerated her first cycle of treatment fairly well without any concerning adverse effects except for fatigue and a very mild rash underneath her chin which has resolved.  Patient is feeling unwell today. She reports she has an upper respiratory infection that began yesterday. She reports associated congestion, mild sinus pressure, fatigue, a non-productive cough secondary to post nasal drainage, and dyspnea on exertion. She denies any fevers, chills, night sweats, weight loss, appetite changes, nausea, or vomiting. She has no known sick contacts. To alleviate her symptoms she has been taking her albuterol inhaler and tessalon perles OTC with relief. She also endorses difficulty sleeping. She takes Ativan to help her sleep which is prescribed by her PCP. She denies any chest pain or hemoptysis. She denies any visual changes or headaches with the exception of mild sinus pressure. She denies any diarrhea or constipation. She is here today for a one week follow-up after receiving her first cycle of treatment last week.    MEDICAL HISTORY: Past Medical History:  Diagnosis Date  . Anemia    "long time ago" (12/06/2012)  .  Arthritis    "right knee real bad; in my hands bad" (12/06/2012)  . Asthma   . Coronary artery disease    a. s/p Xience DES to [facility] 04/2008;  b. LHC 05/2008: Proximal RCA 25%, mid RCA stent patent.  c. Anormal nuc 2014 -> s/p LHC with severe mRCA stenosis s/p DES. d. Cath 04/2013 s/p DES to LAD.  e. LHC 08/2015 was stable.  . Depression    "years ago" (12/06/2012)  . Fibromyalgia   . GERD (gastroesophageal reflux disease)   . H/O hiatal hernia   . Hepatitis    "not A, B, or C" (12/06/2012)  . Hyperlipidemia    intol to statins and other chol agents due to elevated LFTs  . Hypertension   . Lung nodules    Bilateral  . Migraines    "when I was younger" (12/06/2012)  . PONV (postoperative nausea and vomiting)    "Very bad"  . Premature atrial contractions   . PVC's (premature ventricular contractions)   . Sinus headache   . SVT (supraventricular tachycardia) (HCC)     ALLERGIES:  is allergic to ciprofloxacin; levofloxacin; statins; tricor [fenofibrate]; latex; codeine; and metoprolol.  MEDICATIONS:  Current Outpatient Medications  Medication Sig Dispense Refill  . albuterol (PROAIR HFA) 108 (90 Base) MCG/ACT inhaler Inhale 1 puff into the lungs every 6 (six) hours as needed (wheezing/shortness of breath.).     Marland Kitchen albuterol (PROVENTIL) (5 MG/ML) 0.5% nebulizer solution Take 2.5 mg by nebulization every 6 (six) hours as needed for wheezing.    Marland Kitchen aspirin 81 MG chewable tablet Chew 1 tablet (81  mg total) by mouth daily.    Marland Kitchen buPROPion (WELLBUTRIN SR) 150 MG 12 hr tablet Take 150-300 mg by mouth See admin instructions. Take 2 tablets (300 mg) by mouth daily in the morning & take 1 tablet (150 mg) by mouth in the evening.    . clopidogrel (PLAVIX) 75 MG tablet Take 1 tablet (75 mg total) by mouth daily. MUST SCHEDULE APPOINTMENT FOR FURTHER REFILLS 30 tablet 0  . ergocalciferol (VITAMIN D2) 50000 UNITS capsule Take 50,000 Units by mouth every Wednesday.     . folic acid (FOLVITE) 1 MG tablet Take  1 tablet (1 mg total) by mouth daily. 30 tablet 4  . gabapentin (NEURONTIN) 100 MG capsule Take 1 capsule by mouth daily.    Marland Kitchen guaifenesin (ROBITUSSIN) 100 MG/5ML syrup Take 100-200 mg by mouth 3 (three) times daily as needed for cough.    Marland Kitchen HYDROcodone-acetaminophen (NORCO) 7.5-325 MG per tablet Take 1 tablet by mouth every 6 (six) hours as needed for pain.    Marland Kitchen levothyroxine (SYNTHROID, LEVOTHROID) 25 MCG tablet Take 25 mcg by mouth daily before breakfast.    . lidocaine (LIDODERM) 5 % Place 1 patch onto the skin as needed.    Marland Kitchen LORazepam (ATIVAN) 2 MG tablet Take 2 mg by mouth See admin instructions. Take 1 tablet (2 mg) by mouth scheduled at bedtime & take 1 tablet (2 mg) by mouth twice daily if needed for anxiety    . metoprolol tartrate (LOPRESSOR) 25 MG tablet Take 1 tablet (25 mg total) by mouth 2 (two) times daily. 180 tablet 3  . montelukast (SINGULAIR) 10 MG tablet Take 10 mg by mouth at bedtime.     . pantoprazole (PROTONIX) 40 MG tablet Take 1 tablet by mouth daily.    . prochlorperazine (COMPAZINE) 10 MG tablet TAKE 1 TABLET(10 MG) BY MOUTH EVERY 6 HOURS AS NEEDED FOR NAUSEA OR VOMITING 30 tablet 0  . SYMBICORT 160-4.5 MCG/ACT inhaler Take 2 puffs by mouth 2 (two) times daily.  3  . temazepam (RESTORIL) 15 MG capsule Take 1 capsule by mouth at bedtime.    Marland Kitchen amLODipine (NORVASC) 5 MG tablet Take 1.5 tablets (7.5 mg total) by mouth daily. (Patient taking differently: Take 7.5 mg by mouth every evening. ) 180 tablet 3  . lidocaine-prilocaine (EMLA) cream Apply 1 application topically as needed. 30 g 0  . nitroGLYCERIN (NITROSTAT) 0.4 MG SL tablet Place 1 tablet (0.4 mg total) under the tongue every 5 (five) minutes as needed for chest pain. (Patient not taking: Reported on 03/03/2018) 25 tablet 12   No current facility-administered medications for this visit.     SURGICAL HISTORY:  Past Surgical History:  Procedure Laterality Date  . CARDIAC CATHETERIZATION  04/2008; 05/2008  . CARDIAC  CATHETERIZATION N/A 08/07/2015   Procedure: Left Heart Cath and Coronary Angiography;  Surgeon: Sherren Mocha, MD;  Location: [facility] CV LAB;  Service: Cardiovascular;  Laterality: N/A;  . CHOLECYSTECTOMY    . CORONARY ANGIOPLASTY WITH STENT PLACEMENT  04/2008 12/06/2012   "1 + 1" (12/06/2012)  . CORONARY STENT PLACEMENT  05/08/2013   DES TO LAD      DR MCALHANY  . LEFT HEART CATHETERIZATION WITH CORONARY ANGIOGRAM N/A 12/06/2012   Procedure: LEFT HEART CATHETERIZATION WITH CORONARY ANGIOGRAM;  Surgeon: [name], MD;  Location: [facility] CATH LAB;  Service: Cardiovascular;  Laterality: N/A;  . LEFT HEART CATHETERIZATION WITH CORONARY ANGIOGRAM N/A 05/08/2013   Procedure: LEFT HEART CATHETERIZATION WITH CORONARY ANGIOGRAM;  Surgeon:  [name], MD;  Location: [facility] CATH LAB;  Service: Cardiovascular;  Laterality: N/A;  . LEFT HEART CATHETERIZATION WITH CORONARY ANGIOGRAM N/A 01/04/2014   Procedure: LEFT HEART CATHETERIZATION WITH CORONARY ANGIOGRAM;  Surgeon: [name], MD;  Location: [facility] CATH LAB;  Service: Cardiovascular;  Laterality: N/A;  . PERCUTANEOUS CORONARY STENT INTERVENTION (PCI-S)  12/06/2012   Procedure: PERCUTANEOUS CORONARY STENT INTERVENTION (PCI-S);  Surgeon: [name], MD;  Location: [facility] CATH LAB;  Service: Cardiovascular;;  . PERCUTANEOUS CORONARY STENT INTERVENTION (PCI-S)  05/08/2013   Procedure: PERCUTANEOUS CORONARY STENT INTERVENTION (PCI-S);  Surgeon: [name], MD;  Location: [facility] CATH LAB;  Service: Cardiovascular;;  mid LAD   . TUBAL LIGATION    . VAGINAL HYSTERECTOMY    . VIDEO BRONCHOSCOPY WITH ENDOBRONCHIAL NAVIGATION N/A 02/17/2018   Procedure: VIDEO BRONCHOSCOPY WITH ENDOBRONCHIAL NAVIGATION;  Surgeon: Melrose Nakayama, MD;  Location: [facility];  Service: Thoracic;  Laterality: N/A;    REVIEW OF SYSTEMS:   Review of Systems  Constitutional: Positive for fatigue. Negative for appetite change, chills, fever and  unexpected weight change.  HENT: Positive for congestion and post nasal drainage. Negative for mouth sores, nosebleeds, sore throat and trouble swallowing.   Eyes: Negative for eye problems and icterus.  Respiratory: Positive for shortness of breath, wheezing, and non-productive cough. Negative hemoptysis.  Cardiovascular: Negative for chest pain and leg swelling.  Gastrointestinal: Negative for abdominal pain, constipation, diarrhea, nausea and vomiting.  Genitourinary: Negative for bladder incontinence, difficulty urinating, dysuria, frequency and hematuria.   Musculoskeletal: Negative for back pain, gait problem, neck pain and neck stiffness.  Skin: Positive for mild rash. Negative for itching.   Neurological: Negative for dizziness, extremity weakness, gait problem, headaches, light-headedness and seizures.  Hematological: Negative for adenopathy. Does not bruise/bleed easily.  Psychiatric/Behavioral: Negative for confusion, depression and sleep disturbance. The patient is not nervous/anxious.     PHYSICAL EXAMINATION:  Blood pressure (!) 145/93, pulse (!) 117, temperature 98.4 F (36.9 C), temperature source Oral, resp. rate 18, height 5' 7"  (1.702 m), weight 167 lb 12.8 oz (76.1 kg), SpO2 96 %.  ECOG PERFORMANCE STATUS: 1 - Symptomatic but completely ambulatory  Physical Exam  Constitutional: Oriented to person, place, and time and well-developed, well-nourished, and in no distress. No distress.  HENT:  Head: Normocephalic and atraumatic.  Mouth/Throat: Oropharynx is clear and moist. No oropharyngeal exudate.  Eyes: Conjunctivae are normal. Right eye exhibits no discharge. Left eye exhibits no discharge. No scleral icterus.  Neck: Normal range of motion. Neck supple.  Cardiovascular: Normal rate, regular rhythm, normal heart sounds and intact distal pulses.   Pulmonary/Chest: Diminished breath sounds in all lung fields with wheezing. Effort normal. No respiratory distress. No No  rales.  Abdominal: Soft. Bowel sounds are normal. Exhibits no distension and no mass. There is no tenderness.  Musculoskeletal: Normal range of motion. Exhibits no edema.  Lymphadenopathy:    No cervical adenopathy.  Neurological: Alert and oriented to person, place, and time. Exhibits normal muscle tone. Gait normal. Coordination normal.  Skin: Skin is warm and dry. Quarter sized area of skin rash underneath the skin without any erythema or skin discoloration. Not diaphoretic. No pallor.  Psychiatric: Mood, memory and judgment normal.  Vitals reviewed.  LABORATORY DATA: Lab Results  Component Value Date   WBC 6.3 04/19/2018   HGB 13.9 04/19/2018   HCT 41.9 04/19/2018   MCV 87.1 04/19/2018   PLT 207 04/19/2018      Chemistry  Component Value Date/Time   NA 139 04/19/2018 1053   NA 143 01/10/2018 1629   K 4.6 04/19/2018 1053   CL 103 04/19/2018 1053   CO2 25 04/19/2018 1053   BUN 21 04/19/2018 1053   BUN 14 01/10/2018 1629   CREATININE 1.17 (H) 04/19/2018 1053   CREATININE 0.68 08/05/2015 1456      Component Value Date/Time   CALCIUM 8.9 04/19/2018 1053   ALKPHOS 91 04/19/2018 1053   AST 18 04/19/2018 1053   ALT 22 04/19/2018 1053   BILITOT 0.5 04/19/2018 1053       RADIOGRAPHIC STUDIES:  No results found.   ASSESSMENT/PLAN:  This is a very pleasant 63 year old caucasian female recently diagnosed with stage IV non-small cell lung cancer consistent with adenocarcinoma with bilateral pulmonary nodules. Molecular testing revealed a PD-L1 expression of 1% and no actionable mutations. The patient had MRI of the brain performed recently that showed no evidence of metastatic disease to the brain.  The patient is currently undergoing treatment with carboplatin for an AUC of 5, Alimta 500 mg mg/m2 and Keytruda  200 mg IV every 3 weeks.   The patient was seen with Dr. Julien Nordmann today. She is status post 1 cycle of treatment and is tolerating it fairly well with the  exception of fatigue and a mild skin rash which resolved without any intervention. The patient continues to take her 1 mg p.o. of folic acid daily.   The patient is interested in a port-a-cath. I will arrange for a referral to IR for placement. A prescription for EMLA cream was sent to the patient's pharmacy.   Regarding the upper respiratory infection, the patient will continue with symptom management with tessalon perles, tylenol, and albuterol treatment as needed for symptom relief as this is helping her. She was also advised to maintain good hydration as well. The patient was encouraged to contact us or her PCP if any new or worsening symptoms including but not limited to fevers, chills, weight loss, nausea, vomiting, shortness of breath, or productive cough.   Otherwise, we will see her back in 2 weeks for evaluation prior to staring cycle #2 in two weeks.   The patient was advised to call immediately if she has any concerning symptoms in the interval. The patient voices understanding of current disease status and treatment options and is in agreement with the current care plan. All questions were answered. The patient knows to call the clinic with any problems, questions or concerns. We can certainly see the patient much sooner if necessary   Orders Placed This Encounter  Procedures  . IR Fluoro Guide CV Line Right    Indicate type of CVC ordering    Standing Status:   Future    Standing Expiration Date:   06/18/2019    Order Specific Question:   Reason for exam:    Answer:   Lung Cancer for Port-A-Cath Placement    Order Specific Question:   Preferred Imaging Location?    Answer:   [place], PA-C 04/19/18  ADDENDUM: Hematology/Oncology Attending: I had a face-to-face encounter with the patient.  I recommended her care plan.  This is a very pleasant 63 years old white female with stage IV non-small cell lung cancer, adenocarcinoma with no  actionable mutations and positive PDL 1 expression of 1%.  The patient is currently undergoing systemic chemotherapy with carboplatin, Alimta and Keytruda status post 1 cycle.  She tolerated  the first week of her treatment well except for fatigue and mild skin rash. I recommended for the patient to increase her oral intake. I will see her back for follow-up visit in 2 weeks for evaluation with the start of cycle #2. The patient was advised to call immediately if she has any concerning symptoms in the interval.  Disclaimer: This note was dictated with voice recognition software. Similar sounding words can inadvertently be transcribed and may be missed upon review. Eilleen Kempf, MD  04/19/18

## 2018-04-23 ENCOUNTER — Other Ambulatory Visit: Payer: Self-pay | Admitting: Internal Medicine

## 2018-04-26 ENCOUNTER — Inpatient Hospital Stay: Payer: Managed Care, Other (non HMO)

## 2018-04-26 DIAGNOSIS — C3492 Malignant neoplasm of unspecified part of left bronchus or lung: Secondary | ICD-10-CM

## 2018-04-26 LAB — CMP (CANCER CENTER ONLY)
ALT: 80 U/L — ABNORMAL HIGH (ref 0–44)
AST: 30 U/L (ref 15–41)
Albumin: 3.6 g/dL (ref 3.5–5.0)
Alkaline Phosphatase: 117 U/L (ref 38–126)
Anion gap: 9 (ref 5–15)
BUN: 16 mg/dL (ref 8–23)
CALCIUM: 9.1 mg/dL (ref 8.9–10.3)
CHLORIDE: 106 mmol/L (ref 98–111)
CO2: 27 mmol/L (ref 22–32)
Creatinine: 0.97 mg/dL (ref 0.44–1.00)
GFR, Est AFR Am: >60 mL/min (ref >60)
Glucose, Bld: 125 mg/dL — ABNORMAL HIGH (ref 70–99)
Potassium: 3.8 mmol/L (ref 3.5–5.1)
Sodium: 142 mmol/L (ref 135–145)
Total Bilirubin: 0.3 mg/dL (ref 0.3–1.2)
Total Protein: 6.8 g/dL (ref 6.5–8.1)

## 2018-04-26 LAB — CBC WITH DIFFERENTIAL (CANCER CENTER ONLY)
Abs Immature Granulocytes: 0 10*3/uL (ref 0.00–0.07)
Basophils Absolute: 0 10*3/uL (ref 0.0–0.1)
Basophils Relative: 0 %
Eosinophils Absolute: 0.2 10*3/uL (ref 0.0–0.5)
Eosinophils Relative: 6 %
HCT: 35.3 % — ABNORMAL LOW (ref 36.0–46.0)
Hemoglobin: 11.2 g/dL — ABNORMAL LOW (ref 12.0–15.0)
Immature Granulocytes: 0 %
LYMPHS ABS: 1.3 10*3/uL (ref 0.7–4.0)
Lymphocytes Relative: 36 %
MCH: 28.8 pg (ref 26.0–34.0)
MCHC: 31.7 g/dL (ref 30.0–36.0)
MCV: 90.7 fL (ref 80.0–100.0)
Monocytes Absolute: 0.5 10*3/uL (ref 0.1–1.0)
Monocytes Relative: 15 %
Neutro Abs: 1.5 10*3/uL — ABNORMAL LOW (ref 1.7–7.7)
Neutrophils Relative %: 43 %
Platelet Count: 91 10*3/uL — ABNORMAL LOW (ref 150–400)
RBC: 3.89 MIL/uL (ref 3.87–5.11)
RDW: 12.5 % (ref 11.5–15.5)
WBC Count: 3.5 10*3/uL — ABNORMAL LOW (ref 4.0–10.5)
nRBC: 0 % (ref 0.0–0.2)

## 2018-04-28 ENCOUNTER — Other Ambulatory Visit: Payer: Self-pay | Admitting: Student

## 2018-04-28 ENCOUNTER — Other Ambulatory Visit: Payer: Self-pay | Admitting: Radiology

## 2018-04-28 ENCOUNTER — Telehealth: Payer: Self-pay | Admitting: Medical Oncology

## 2018-04-28 NOTE — Telephone Encounter (Signed)
Reviewed labs with pt.  Insomnia- she said she is not taking restoril.  I instructed her to take her restoril.

## 2018-04-29 ENCOUNTER — Other Ambulatory Visit: Payer: Self-pay

## 2018-04-29 ENCOUNTER — Ambulatory Visit (HOSPITAL_COMMUNITY)
Admission: RE | Admit: 2018-04-29 | Discharge: 2018-04-29 | Disposition: A | Discharge status: Home / Self Care | Payer: Managed Care, Other (non HMO) | Source: Ambulatory Visit | Attending: Internal Medicine | Admitting: Internal Medicine

## 2018-04-29 ENCOUNTER — Other Ambulatory Visit: Payer: Self-pay | Admitting: Physician Assistant

## 2018-04-29 ENCOUNTER — Ambulatory Visit (HOSPITAL_COMMUNITY)
Admission: RE | Admit: 2018-04-29 | Discharge: 2018-04-29 | Disposition: A | Discharge status: Home / Self Care | Payer: Managed Care, Other (non HMO) | Source: Ambulatory Visit | Attending: Physician Assistant | Admitting: Physician Assistant

## 2018-04-29 ENCOUNTER — Encounter (HOSPITAL_COMMUNITY): Payer: Self-pay

## 2018-04-29 DIAGNOSIS — Z79890 Hormone replacement therapy (postmenopausal): Secondary | ICD-10-CM | POA: Insufficient documentation

## 2018-04-29 DIAGNOSIS — M797 Fibromyalgia: Secondary | ICD-10-CM | POA: Insufficient documentation

## 2018-04-29 DIAGNOSIS — Z9221 Personal history of antineoplastic chemotherapy: Secondary | ICD-10-CM | POA: Insufficient documentation

## 2018-04-29 DIAGNOSIS — E785 Hyperlipidemia, unspecified: Secondary | ICD-10-CM | POA: Diagnosis not present

## 2018-04-29 DIAGNOSIS — I471 Supraventricular tachycardia: Secondary | ICD-10-CM | POA: Diagnosis not present

## 2018-04-29 DIAGNOSIS — K219 Gastro-esophageal reflux disease without esophagitis: Secondary | ICD-10-CM | POA: Diagnosis not present

## 2018-04-29 DIAGNOSIS — F329 Major depressive disorder, single episode, unspecified: Secondary | ICD-10-CM | POA: Diagnosis not present

## 2018-04-29 DIAGNOSIS — Z7982 Long term (current) use of aspirin: Secondary | ICD-10-CM | POA: Insufficient documentation

## 2018-04-29 DIAGNOSIS — C3492 Malignant neoplasm of unspecified part of left bronchus or lung: Secondary | ICD-10-CM | POA: Diagnosis present

## 2018-04-29 DIAGNOSIS — J45909 Unspecified asthma, uncomplicated: Secondary | ICD-10-CM | POA: Diagnosis not present

## 2018-04-29 DIAGNOSIS — I10 Essential (primary) hypertension: Secondary | ICD-10-CM | POA: Insufficient documentation

## 2018-04-29 DIAGNOSIS — Z79899 Other long term (current) drug therapy: Secondary | ICD-10-CM | POA: Insufficient documentation

## 2018-04-29 HISTORY — PX: IR IMAGING GUIDED PORT INSERTION: IMG5740

## 2018-04-29 LAB — PROTIME-INR
INR: 0.8 (ref 0.8–1.2)
Prothrombin Time: 11.1 seconds — ABNORMAL LOW (ref 11.4–15.2)

## 2018-04-29 LAB — CBC
HCT: 35.4 % — ABNORMAL LOW (ref 36.0–46.0)
HEMOGLOBIN: 11.3 g/dL — AB (ref 12.0–15.0)
MCH: 29.2 pg (ref 26.0–34.0)
MCHC: 31.9 g/dL (ref 30.0–36.0)
MCV: 91.5 fL (ref 80.0–100.0)
Platelets: 197 10*3/uL (ref 150–400)
RBC: 3.87 MIL/uL (ref 3.87–5.11)
RDW: 12.7 % (ref 11.5–15.5)
WBC: 3.8 10*3/uL — ABNORMAL LOW (ref 4.0–10.5)
nRBC: 0 % (ref 0.0–0.2)

## 2018-04-29 LAB — BASIC METABOLIC PANEL
ANION GAP: 7 (ref 5–15)
BUN: 21 mg/dL (ref 8–23)
CHLORIDE: 106 mmol/L (ref 98–111)
CO2: 26 mmol/L (ref 22–32)
Calcium: 8.9 mg/dL (ref 8.9–10.3)
Creatinine, Ser: 0.97 mg/dL (ref 0.44–1.00)
GFR calc Af Amer: >60 mL/min (ref >60)
GFR calc non Af Amer: >60 mL/min (ref >60)
Glucose, Bld: 106 mg/dL — ABNORMAL HIGH (ref 70–99)
Potassium: 4.4 mmol/L (ref 3.5–5.1)
Sodium: 139 mmol/L (ref 135–145)

## 2018-04-29 MED ORDER — LIDOCAINE-EPINEPHRINE (PF) 2 %-1:200000 IJ SOLN
INTRAMUSCULAR | Status: AC
  Filled 2018-04-29: qty 20

## 2018-04-29 MED ORDER — CEFAZOLIN SODIUM-DEXTROSE 2-4 GM/100ML-% IV SOLN
INTRAVENOUS | Status: AC
  Administered 2018-04-29: 2 g via INTRAVENOUS
  Filled 2018-04-29: qty 100

## 2018-04-29 MED ORDER — FENTANYL CITRATE (PF) 100 MCG/2ML IJ SOLN
INTRAMUSCULAR | Status: AC
  Filled 2018-04-29: qty 2

## 2018-04-29 MED ORDER — FENTANYL CITRATE (PF) 100 MCG/2ML IJ SOLN
INTRAMUSCULAR | Status: AC | PRN
  Administered 2018-04-29 (×2): 50 ug via INTRAVENOUS

## 2018-04-29 MED ORDER — HEPARIN SOD (PORK) LOCK FLUSH 100 UNIT/ML IV SOLN
INTRAVENOUS | Status: AC
  Filled 2018-04-29: qty 5

## 2018-04-29 MED ORDER — MIDAZOLAM HCL 2 MG/2ML IJ SOLN
INTRAMUSCULAR | Status: AC | PRN
  Administered 2018-04-29 (×5): 1 mg via INTRAVENOUS

## 2018-04-29 MED ORDER — MIDAZOLAM HCL 2 MG/2ML IJ SOLN
INTRAMUSCULAR | Status: AC
  Filled 2018-04-29: qty 4

## 2018-04-29 MED ORDER — CEFAZOLIN SODIUM-DEXTROSE 2-4 GM/100ML-% IV SOLN
2.0000 g | INTRAVENOUS | Status: AC
  Administered 2018-04-29: 2 g via INTRAVENOUS

## 2018-04-29 MED ORDER — HEPARIN SOD (PORK) LOCK FLUSH 100 UNIT/ML IV SOLN
INTRAVENOUS | Status: AC | PRN
  Administered 2018-04-29: 500 [IU] via INTRAVENOUS

## 2018-04-29 MED ORDER — LIDOCAINE HCL (PF) 1 % IJ SOLN
INTRAMUSCULAR | Status: AC | PRN
  Administered 2018-04-29: 5 mL

## 2018-04-29 MED ORDER — SODIUM CHLORIDE 0.9 % IV SOLN
INTRAVENOUS | Status: DC
  Administered 2018-04-29: 11:00:00 via INTRAVENOUS

## 2018-04-29 NOTE — Procedures (Signed)
Interventional Radiology Procedure Note  Procedure: Placement of a right IJ approach single lumen PowerPort.  Tip is positioned at the superior cavoatrial junction and catheter is ready for immediate use.  Complications: No immediate Recommendations:  - Ok to shower tomorrow - Do not submerge for 7 days - Routine line care   Signed,  Keoshia Steinmetz K. Elenore Wanninger, MD   

## 2018-04-29 NOTE — Discharge Instructions (Signed)
Do not use EMLA cream on the skin glue (dermabond) until the cancer center nurses say it is okay. The EMLA cream will dissolve the glue.     Implanted [facility] Guide An implanted port is a device that is placed under the skin. It is usually placed in the chest. The device can be used to give IV medicine, to take blood, or for dialysis. You may have an implanted port if:  You need IV medicine that would be irritating to the small veins in your hands or arms.  You need IV medicines, such as antibiotics, for a long period of time.  You need IV nutrition for a long period of time.  You need dialysis. Having a port means that your health care provider will not need to use the veins in your arms for these procedures. You may have fewer limitations when using a port than you would if you used other types of long-term IVs, and you will likely be able to return to normal activities after your incision heals. An implanted port has two main parts:  Reservoir. The reservoir is the part where a needle is inserted to give medicines or draw blood. The reservoir is round. After it is placed, it appears as a small, raised area under your skin.  Catheter. The catheter is a thin, flexible tube that connects the reservoir to a vein. Medicine that is inserted into the reservoir goes into the catheter and then into the vein. How is my port accessed? To access your port:  A numbing cream may be placed on the skin over the port site.  Your health care provider will put on a mask and sterile gloves.  The skin over your port will be cleaned carefully with a germ-killing soap and allowed to dry.  Your health care provider will gently pinch the port and insert a needle into it.  Your health care provider will check for a blood return to make sure the port is in the vein and is not clogged.  If your port needs to remain accessed to get medicine continuously (constant infusion), your health care provider will  place a clear bandage (dressing) over the needle site. The dressing and needle will need to be changed every week, or as told by your health care provider. What is flushing? Flushing helps keep the port from getting clogged. Follow instructions from your health care provider about how and when to flush the port. Ports are usually flushed with saline solution or a medicine called heparin. The need for flushing will depend on how the port is used:  If the port is only used from time to time to give medicines or draw blood, the port may need to be flushed: ? Before and after medicines have been given. ? Before and after blood has been drawn. ? As part of routine maintenance. Flushing may be recommended every 4-6 weeks.  If a constant infusion is running, the port may not need to be flushed.  Throw away any syringes in a disposal container that is meant for sharp items (sharps container). You can buy a sharps container from a pharmacy, or you can make one by using an empty hard plastic bottle with a cover. How long will my port stay implanted? The port can stay in for as long as your health care provider thinks it is needed. When it is time for the port to come out, a surgery will be done to remove it. The surgery will  be similar to the procedure that was done to put the port in. Follow these instructions at home:   Flush your port as told by your health care provider.  If you need an infusion over several days, follow instructions from your health care provider about how to take care of your port site. Make sure you: ? Wash your hands with soap and water before you change your dressing. If soap and water are not available, use alcohol-based hand sanitizer. ? Change your dressing as told by your health care provider. ? Place any used dressings or infusion bags into a plastic bag. Throw that bag in the trash. ? Keep the dressing that covers the needle clean and dry. Do not get it wet. ? Do not use  scissors or sharp objects near the tube. ? Keep the tube clamped, unless it is being used.  Check your port site every day for signs of infection. Check for: ? Redness, swelling, or pain. ? Fluid or blood. ? Pus or a bad smell.  Protect the skin around the port site. ? Avoid wearing bra straps that rub or irritate the site. ? Protect the skin around your port from seat belts. Place a soft pad over your chest if needed.  Bathe or shower as told by your health care provider. The site may get wet as long as you are not actively receiving an infusion.  Return to your normal activities as told by your health care provider. Ask your health care provider what activities are safe for you.  Carry a medical alert card or wear a medical alert bracelet at all times. This will let health care providers know that you have an implanted port in case of an emergency. Get help right away if:  You have redness, swelling, or pain at the port site.  You have fluid or blood coming from your port site.  You have pus or a bad smell coming from the port site.  You have a fever. Summary  Implanted ports are usually placed in the chest for long-term IV access.  Follow instructions from your health care provider about flushing the port and changing bandages (dressings).  Take care of the area around your port by avoiding clothing that puts pressure on the area, and by watching for signs of infection.  Protect the skin around your port from seat belts. Place a soft pad over your chest if needed.  Get help right away if you have a fever or you have redness, swelling, pain, drainage, or a bad smell at the port site. This information is not intended to replace advice given to you by your health care provider. Make sure you discuss any questions you have with your health care provider. Document Released: 02/16/2005 Document Revised: 03/21/2016 Document Reviewed: 03/21/2016 Elsevier Interactive Patient  Education  2019 [employer].     Moderate Conscious Sedation, Adult, Care After These instructions provide you with information about caring for yourself after your procedure. Your health care provider may also give you more specific instructions. Your treatment has been planned according to current medical practices, but problems sometimes occur. Call your health care provider if you have any problems or questions after your procedure. What can I expect after the procedure? After your procedure, it is common:  To feel sleepy for several hours.  To feel clumsy and have poor balance for several hours.  To have poor judgment for several hours.  To vomit if you eat too soon. Follow  these instructions at home: For at least 24 hours after the procedure:   Do not: ? Participate in activities where you could fall or become injured. ? Drive. ? Use heavy machinery. ? Drink alcohol. ? Take sleeping pills or medicines that cause drowsiness. ? Make important decisions or sign legal documents. ? Take care of children on your own.  Rest. Eating and drinking  Follow the diet recommended by your health care provider.  If you vomit: ? Drink water, juice, or soup when you can drink without vomiting. ? Make sure you have little or no nausea before eating solid foods. General instructions  Have a responsible adult stay with you until you are awake and alert.  Take over-the-counter and prescription medicines only as told by your health care provider.  If you smoke, do not smoke without supervision.  Keep all follow-up visits as told by your health care provider. This is important. Contact a health care provider if:  You keep feeling nauseous or you keep vomiting.  You feel light-headed.  You develop a rash.  You have a fever. Get help right away if:  You have trouble breathing. This information is not intended to replace advice given to you by your health care provider. Make  sure you discuss any questions you have with your health care provider. Document Released: 12/07/2012 Document Revised: 07/22/2015 Document Reviewed: 06/08/2015 Elsevier Interactive Patient Education  2019 [employer].

## 2018-04-29 NOTE — H&P (Addendum)
Referring Physician(s): Mohamed,M  Supervising Physician: Jacqulynn Cadet  Patient Status:  [name] OP    Chief Complaint: "I'm here for a port a cath"   Subjective: Patient familiar to IR service from prior random liver biopsy in 2011.  She has a history of stage IV non-small cell lung cancer, adenocarcinoma, who presented with multifocal disease in both lungs including 3 nodules in the left lung as well as 2 nodules in the right lung diagnosed in December 2019.  She is currently on chemo/immuno therapy and has poor venous access.  She presents today for Port-A-Cath placement.  She denies fever, headache, chest pain, vomiting or abnormal bleeding.  She does have dyspnea with exertion, occasional cough, intermittent nausea and back pain.  She continues to smoke.  Past Medical History:  Diagnosis Date  . Anemia    "long time ago" (12/06/2012)  . Arthritis    "right knee real bad; in my hands bad" (12/06/2012)  . Asthma   . Coronary artery disease    a. s/p Xience DES to [facility] 04/2008;  b. LHC 05/2008: Proximal RCA 25%, mid RCA stent patent.  c. Anormal nuc 2014 -> s/p LHC with severe mRCA stenosis s/p DES. d. Cath 04/2013 s/p DES to LAD.  e. LHC 08/2015 was stable.  . Depression    "years ago" (12/06/2012)  . Fibromyalgia   . GERD (gastroesophageal reflux disease)   . H/O hiatal hernia   . Hepatitis    "not A, B, or C" (12/06/2012)  . Hyperlipidemia    intol to statins and other chol agents due to elevated LFTs  . Hypertension   . Lung nodules    Bilateral  . Migraines    "when I was younger" (12/06/2012)  . PONV (postoperative nausea and vomiting)    "Very bad"  . Premature atrial contractions   . PVC's (premature ventricular contractions)   . Sinus headache   . SVT (supraventricular tachycardia) (HCC)    Past Surgical History:  Procedure Laterality Date  . CARDIAC CATHETERIZATION  04/2008; 05/2008  . CARDIAC CATHETERIZATION N/A 08/07/2015   Procedure: Left Heart Cath and  Coronary Angiography;  Surgeon: Sherren Mocha, MD;  Location: [place] CV LAB;  Service: Cardiovascular;  Laterality: N/A;  . CHOLECYSTECTOMY    . CORONARY ANGIOPLASTY WITH STENT PLACEMENT  04/2008 12/06/2012   "1 + 1" (12/06/2012)  . CORONARY STENT PLACEMENT  05/08/2013   DES TO LAD      DR MCALHANY  . LEFT HEART CATHETERIZATION WITH CORONARY ANGIOGRAM N/A 12/06/2012   Procedure: LEFT HEART CATHETERIZATION WITH CORONARY ANGIOGRAM;  Surgeon: [name], MD;  Location: [facility] CATH LAB;  Service: Cardiovascular;  Laterality: N/A;  . LEFT HEART CATHETERIZATION WITH CORONARY ANGIOGRAM N/A 05/08/2013   Procedure: LEFT HEART CATHETERIZATION WITH CORONARY ANGIOGRAM;  Surgeon: [name], MD;  Location: [facility] CATH LAB;  Service: Cardiovascular;  Laterality: N/A;  . LEFT HEART CATHETERIZATION WITH CORONARY ANGIOGRAM N/A 01/04/2014   Procedure: LEFT HEART CATHETERIZATION WITH CORONARY ANGIOGRAM;  Surgeon: [name], MD;  Location: [facility] CATH LAB;  Service: Cardiovascular;  Laterality: N/A;  . PERCUTANEOUS CORONARY STENT INTERVENTION (PCI-S)  12/06/2012   Procedure: PERCUTANEOUS CORONARY STENT INTERVENTION (PCI-S);  Surgeon: [name], MD;  Location: [facility] CATH LAB;  Service: Cardiovascular;;  . PERCUTANEOUS CORONARY STENT INTERVENTION (PCI-S)  05/08/2013   Procedure: PERCUTANEOUS CORONARY STENT INTERVENTION (PCI-S);  Surgeon: [name], MD;  Location: [facility] CATH LAB;  Service: Cardiovascular;;  mid LAD   .  TUBAL LIGATION    . VAGINAL HYSTERECTOMY    . VIDEO BRONCHOSCOPY WITH ENDOBRONCHIAL NAVIGATION N/A 02/17/2018   Procedure: VIDEO BRONCHOSCOPY WITH ENDOBRONCHIAL NAVIGATION;  Surgeon: Melrose Nakayama, MD;  Location: [facility] OR;  Service: Thoracic;  Laterality: N/A;       Allergies: Ciprofloxacin; Levofloxacin; Statins; Tricor [fenofibrate]; Latex; Codeine; and Metoprolol  Medications: Prior to Admission medications   Medication Sig Start Date End Date  Taking? Authorizing Provider  albuterol (PROAIR HFA) 108 (90 Base) MCG/ACT inhaler Inhale 1 puff into the lungs every 6 (six) hours as needed (wheezing/shortness of breath.).    Yes [provider]  albuterol (PROVENTIL) (5 MG/ML) 0.5% nebulizer solution Take 2.5 mg by nebulization every 6 (six) hours as needed for wheezing.   Yes [provider]  aspirin 81 MG chewable tablet Chew 1 tablet (81 mg total) by mouth daily. 12/07/12  Yes Barrett, Evelene Croon, PA-C  buPROPion (WELLBUTRIN SR) 150 MG 12 hr tablet Take 150-300 mg by mouth See admin instructions. Take 2 tablets (300 mg) by mouth daily in the morning & take 1 tablet (150 mg) by mouth in the evening.   Yes [provider]  ergocalciferol (VITAMIN D2) 50000 UNITS capsule Take 50,000 Units by mouth every Wednesday.    Yes [provider]  folic acid (FOLVITE) 1 MG tablet Take 1 tablet (1 mg total) by mouth daily. 04/05/18  Yes Curt Bears, MD  guaifenesin (ROBITUSSIN) 100 MG/5ML syrup Take 100-200 mg by mouth 3 (three) times daily as needed for cough.   Yes [provider]  HYDROcodone-acetaminophen (NORCO) 7.5-325 MG per tablet Take 1 tablet by mouth every 6 (six) hours as needed for pain.   Yes [provider]  levothyroxine (SYNTHROID, LEVOTHROID) 25 MCG tablet Take 25 mcg by mouth daily before breakfast.   Yes [provider]  lidocaine (LIDODERM) 5 % Place 1 patch onto the skin as needed.   Yes [provider]  LORazepam (ATIVAN) 2 MG tablet Take 2 mg by mouth See admin instructions. Take 1 tablet (2 mg) by mouth scheduled at bedtime & take 1 tablet (2 mg) by mouth twice daily if needed for anxiety   Yes [provider]  metoprolol tartrate (LOPRESSOR) 25 MG tablet Take 1 tablet (25 mg total) by mouth 2 (two) times daily. 02/11/18  Yes [name], MD  montelukast (SINGULAIR) 10 MG tablet Take 10 mg by mouth at bedtime.    Yes [provider]    pantoprazole (PROTONIX) 40 MG tablet Take 1 tablet by mouth daily. 07/20/11  Yes [provider]  prochlorperazine (COMPAZINE) 10 MG tablet TAKE 1 TABLET(10 MG) BY MOUTH EVERY 6 HOURS AS NEEDED FOR NAUSEA OR VOMITING 04/24/18  Yes Curt Bears, MD  SYMBICORT 160-4.5 MCG/ACT inhaler Take 2 puffs by mouth 2 (two) times daily. 12/08/17  Yes [provider]  temazepam (RESTORIL) 15 MG capsule Take 1 capsule by mouth at bedtime.   Yes [provider]  amLODipine (NORVASC) 5 MG tablet Take 1.5 tablets (7.5 mg total) by mouth daily. Patient taking differently: Take 7.5 mg by mouth every evening.  01/10/18 04/10/18  [name], MD  clopidogrel (PLAVIX) 75 MG tablet Take 1 tablet (75 mg total) by mouth daily. MUST SCHEDULE APPOINTMENT FOR FURTHER REFILLS 09/04/16   [name], MD  gabapentin (NEURONTIN) 100 MG capsule Take 1 capsule by mouth daily. 04/18/18   [provider]  lidocaine-prilocaine (EMLA) cream Apply 1 application topically as  needed. 04/19/18   Heilingoetter, Cassandra L, PA-C  nitroGLYCERIN (NITROSTAT) 0.4 MG SL tablet Place 1 tablet (0.4 mg total) under the tongue every 5 (five) minutes as needed for chest pain. Patient not taking: Reported on 03/03/2018 12/07/12   Barrett, Evelene Croon, PA-C     Vital Signs: BP 123/86   Pulse 72   Temp 97.8 F (36.6 C) (Oral)   Resp 16   Ht 5' 7"  (1.702 m)   Wt 162 lb (73.5 kg)   SpO2 96%   BMI 25.37 kg/m   Physical Exam awake, alert.  Chest with distant breath sounds bilaterally.  Heart with regular rate and rhythm.  Abdomen soft, positive bowel sounds, mild mid abdominal tenderness to palpation (known HH), no lower extremity edema  Imaging: No results found.  Labs:  CBC: Recent Labs    04/13/18 0812 04/19/18 1053 04/26/18 1210 04/29/18 1108  WBC 9.5 6.3 3.5* 3.8*  HGB 13.3 13.9 11.2* 11.3*  HCT 40.5 41.9 35.3* 35.4*  PLT 258 207 91* 197    COAGS: Recent Labs     02/16/18 1525  INR 0.88  APTT 27    BMP: Recent Labs    02/16/18 1525 04/13/18 0812 04/19/18 1053 04/26/18 1210  NA 141 141 139 142  K 3.6 4.0 4.6 3.8  CL 102 107 103 106  CO2 29 25 25 27   GLUCOSE 119* 109* 147* 125*  BUN 17 12 21 16   CALCIUM 9.1 8.8* 8.9 9.1  CREATININE 0.78 0.76 1.17* 0.97  GFRNONAA >60 >60 50* >60  GFRAA >60 >60 57* >60    LIVER FUNCTION TESTS: Recent Labs    02/16/18 1525 04/13/18 0812 04/19/18 1053 04/26/18 1210  BILITOT 0.8 0.6 0.5 0.3  AST 42* 25 18 30   ALT 55* 23 22 80*  ALKPHOS 80 92 91 117  PROT 6.5 6.5 6.7 6.8  ALBUMIN 3.8 3.5 3.7 3.6    Assessment and Plan:  Pt with history of stage IV non-small cell lung cancer, adenocarcinoma, who presented with multifocal disease in both lungs including 3 nodules in the left lung as well as 2 nodules in the right lung diagnosed in December 2019.  She is currently on chemo/immunotherapy and has poor venous access.  She presents today for Port-A-Cath placement. Risks and benefits of image guided port-a-catheter placement was discussed with the patient including, but not limited to bleeding, infection, pneumothorax, or fibrin sheath development and need for additional procedures.  All of the patient's questions were answered, patient is agreeable to proceed. Consent signed and in chart.     Electronically Signed: D. Rowe Robert, PA-C 04/29/2018, 11:39 AM   I spent a total of 25 minutes at the the patient's bedside AND on the patient's hospital floor or unit, greater than 50% of which was counseling/coordinating care for port a cath placement

## 2018-05-02 ENCOUNTER — Telehealth: Payer: Self-pay | Admitting: Internal Medicine

## 2018-05-02 ENCOUNTER — Inpatient Hospital Stay (HOSPITAL_BASED_OUTPATIENT_CLINIC_OR_DEPARTMENT_OTHER): Payer: Managed Care, Other (non HMO) | Admitting: Internal Medicine

## 2018-05-02 ENCOUNTER — Ambulatory Visit: Payer: Managed Care, Other (non HMO) | Admitting: Physician Assistant

## 2018-05-02 ENCOUNTER — Inpatient Hospital Stay: Payer: Managed Care, Other (non HMO)

## 2018-05-02 ENCOUNTER — Inpatient Hospital Stay: Payer: Managed Care, Other (non HMO) | Attending: Internal Medicine

## 2018-05-02 ENCOUNTER — Encounter: Payer: Self-pay | Admitting: Internal Medicine

## 2018-05-02 VITALS — BP 151/94 | HR 108 | Temp 98.2°F | Resp 18 | Ht 67.0 in | Wt 166.2 lb

## 2018-05-02 DIAGNOSIS — R531 Weakness: Secondary | ICD-10-CM | POA: Insufficient documentation

## 2018-05-02 DIAGNOSIS — R5382 Chronic fatigue, unspecified: Secondary | ICD-10-CM

## 2018-05-02 DIAGNOSIS — Z79899 Other long term (current) drug therapy: Secondary | ICD-10-CM | POA: Diagnosis not present

## 2018-05-02 DIAGNOSIS — Z7902 Long term (current) use of antithrombotics/antiplatelets: Secondary | ICD-10-CM

## 2018-05-02 DIAGNOSIS — I2510 Atherosclerotic heart disease of native coronary artery without angina pectoris: Secondary | ICD-10-CM

## 2018-05-02 DIAGNOSIS — Z7689 Persons encountering health services in other specified circumstances: Secondary | ICD-10-CM | POA: Diagnosis not present

## 2018-05-02 DIAGNOSIS — Z5112 Encounter for antineoplastic immunotherapy: Secondary | ICD-10-CM

## 2018-05-02 DIAGNOSIS — Z7982 Long term (current) use of aspirin: Secondary | ICD-10-CM | POA: Diagnosis not present

## 2018-05-02 DIAGNOSIS — K219 Gastro-esophageal reflux disease without esophagitis: Secondary | ICD-10-CM | POA: Diagnosis not present

## 2018-05-02 DIAGNOSIS — M1990 Unspecified osteoarthritis, unspecified site: Secondary | ICD-10-CM | POA: Diagnosis not present

## 2018-05-02 DIAGNOSIS — Z955 Presence of coronary angioplasty implant and graft: Secondary | ICD-10-CM

## 2018-05-02 DIAGNOSIS — Z95828 Presence of other vascular implants and grafts: Secondary | ICD-10-CM

## 2018-05-02 DIAGNOSIS — Z5111 Encounter for antineoplastic chemotherapy: Secondary | ICD-10-CM | POA: Insufficient documentation

## 2018-05-02 DIAGNOSIS — R5383 Other fatigue: Secondary | ICD-10-CM | POA: Diagnosis not present

## 2018-05-02 DIAGNOSIS — C3492 Malignant neoplasm of unspecified part of left bronchus or lung: Secondary | ICD-10-CM | POA: Insufficient documentation

## 2018-05-02 DIAGNOSIS — E785 Hyperlipidemia, unspecified: Secondary | ICD-10-CM

## 2018-05-02 DIAGNOSIS — R110 Nausea: Secondary | ICD-10-CM | POA: Diagnosis not present

## 2018-05-02 LAB — CMP (CANCER CENTER ONLY)
ALT: 38 U/L (ref 0–44)
AST: 27 U/L (ref 15–41)
Albumin: 4 g/dL (ref 3.5–5.0)
Alkaline Phosphatase: 125 U/L (ref 38–126)
Anion gap: 9 (ref 5–15)
BUN: 15 mg/dL (ref 8–23)
CHLORIDE: 106 mmol/L (ref 98–111)
CO2: 27 mmol/L (ref 22–32)
Calcium: 9 mg/dL (ref 8.9–10.3)
Creatinine: 0.88 mg/dL (ref 0.44–1.00)
GFR, Est AFR Am: >60 mL/min (ref >60)
GFR, Estimated: >60 mL/min (ref >60)
Glucose, Bld: 97 mg/dL (ref 70–99)
Potassium: 4.1 mmol/L (ref 3.5–5.1)
Sodium: 142 mmol/L (ref 135–145)
Total Bilirubin: 0.3 mg/dL (ref 0.3–1.2)
Total Protein: 7.2 g/dL (ref 6.5–8.1)

## 2018-05-02 LAB — CBC WITH DIFFERENTIAL (CANCER CENTER ONLY)
Abs Immature Granulocytes: 0.03 10*3/uL (ref 0.00–0.07)
Basophils Absolute: 0 10*3/uL (ref 0.0–0.1)
Basophils Relative: 0 %
Eosinophils Absolute: 0.1 10*3/uL (ref 0.0–0.5)
Eosinophils Relative: 2 %
HCT: 35.4 % — ABNORMAL LOW (ref 36.0–46.0)
Hemoglobin: 11.9 g/dL — ABNORMAL LOW (ref 12.0–15.0)
Immature Granulocytes: 1 %
LYMPHS PCT: 37 %
Lymphs Abs: 1.6 10*3/uL (ref 0.7–4.0)
MCH: 30 pg (ref 26.0–34.0)
MCHC: 33.6 g/dL (ref 30.0–36.0)
MCV: 89.2 fL (ref 80.0–100.0)
Monocytes Absolute: 0.6 10*3/uL (ref 0.1–1.0)
Monocytes Relative: 14 %
Neutro Abs: 1.9 10*3/uL (ref 1.7–7.7)
Neutrophils Relative %: 46 %
Platelet Count: 244 10*3/uL (ref 150–400)
RBC: 3.97 MIL/uL (ref 3.87–5.11)
RDW: 12.8 % (ref 11.5–15.5)
WBC: 4.2 10*3/uL (ref 4.0–10.5)
nRBC: 0 % (ref 0.0–0.2)

## 2018-05-02 LAB — TSH: TSH: 0.983 u[IU]/mL (ref 0.308–3.960)

## 2018-05-02 MED ORDER — SODIUM CHLORIDE 0.9% FLUSH
10.0000 mL | Freq: Once | INTRAVENOUS | Status: AC
  Administered 2018-05-02: 10 mL
  Filled 2018-05-02: qty 10

## 2018-05-02 NOTE — Telephone Encounter (Signed)
Scheduled appt per 3/2 los.  Printed calendar and avs.

## 2018-05-02 NOTE — Progress Notes (Signed)
[place] [phone]   [phone]  OFFICE PROGRESS NOTE  [name], [place] [address]  DIAGNOSIS: stage IV (T1a, N0, M1 a) non-small cell lung cancer, adenocarcinoma presented with multifocal disease in both lungs including 3 nodules in the left lung as well as 2 nodules in the right lung diagnosed in December 2019.  No actionable mutations KRAS G12C BCOR N1425S-subclonal RAD21 amplification  PDL 1 expression 1%.  PRIOR THERAPY: None.   CURRENT THERAPY: Systemic chemotherapy with carboplatin for AUC of 5, Alimta 500 mg/M2 and Keytruda 200 mg IV every 3 weeks.  First dose April 12, 2018.  INTERVAL HISTORY: [name] 63 y.o. female returns to the clinic today for follow-up visit.  The patient is feeling fine today with no concerning complaints except for fatigue and weakness in the lower extremities.  She tolerated the first cycle of her treatment fairly well except for mild nausea.  She denied having any current fever or chills.  She has no current nausea, vomiting, diarrhea or constipation.  She denied having any headache or visual changes.  She has no chest pain, shortness of breath, cough or hemoptysis.  The patient is here today for evaluation before starting cycle #2.  MEDICAL HISTORY: Past Medical History:  Diagnosis Date  . Anemia    "long time ago" (12/06/2012)  . Arthritis    "right knee real bad; in my hands bad" (12/06/2012)  . Asthma   . Coronary artery disease    a. s/p Xience DES to [facility] 04/2008;  b. LHC 05/2008: Proximal RCA 25%, mid RCA stent patent.  c. Anormal nuc 2014 -> s/p LHC with severe mRCA stenosis s/p DES. d. Cath 04/2013 s/p DES to LAD.  e. LHC 08/2015 was stable.  . Depression    "years ago" (12/06/2012)  . Fibromyalgia   . GERD (gastroesophageal reflux disease)   . H/O hiatal hernia   . Hepatitis    "not A, B, or C" (12/06/2012)  . Hyperlipidemia    intol to statins and  other chol agents due to elevated LFTs  . Hypertension   . Lung nodules    Bilateral  . Migraines    "when I was younger" (12/06/2012)  . PONV (postoperative nausea and vomiting)    "Very bad"  . Premature atrial contractions   . PVC's (premature ventricular contractions)   . Sinus headache   . SVT (supraventricular tachycardia) (HCC)     ALLERGIES:  is allergic to ciprofloxacin; levofloxacin; statins; tricor [fenofibrate]; latex; codeine; and metoprolol.  MEDICATIONS:  Current Outpatient Medications  Medication Sig Dispense Refill  . albuterol (PROAIR HFA) 108 (90 Base) MCG/ACT inhaler Inhale 1 puff into the lungs every 6 (six) hours as needed (wheezing/shortness of breath.).     Marland Kitchen albuterol (PROVENTIL) (5 MG/ML) 0.5% nebulizer solution Take 2.5 mg by nebulization every 6 (six) hours as needed for wheezing.    Marland Kitchen amLODipine (NORVASC) 5 MG tablet Take 1.5 tablets (7.5 mg total) by mouth daily. (Patient taking differently: Take 7.5 mg by mouth every evening. ) 180 tablet 3  . aspirin 81 MG chewable tablet Chew 1 tablet (81 mg total) by mouth daily.    Marland Kitchen buPROPion (WELLBUTRIN SR) 150 MG 12 hr tablet Take 150-300 mg by mouth See admin instructions. Take 2 tablets (300 mg) by mouth daily in the morning & take 1 tablet (150 mg) by mouth in the evening.    Marland Kitchen  clopidogrel (PLAVIX) 75 MG tablet Take 1 tablet (75 mg total) by mouth daily. MUST SCHEDULE APPOINTMENT FOR FURTHER REFILLS 30 tablet 0  . ergocalciferol (VITAMIN D2) 50000 UNITS capsule Take 50,000 Units by mouth every Wednesday.     . folic acid (FOLVITE) 1 MG tablet Take 1 tablet (1 mg total) by mouth daily. 30 tablet 4  . gabapentin (NEURONTIN) 100 MG capsule Take 1 capsule by mouth daily.    . guaifenesin (ROBITUSSIN) 100 MG/5ML syrup Take 100-200 mg by mouth 3 (three) times daily as needed for cough.    . HYDROcodone-acetaminophen (NORCO) 7.5-325 MG per tablet Take 1 tablet by mouth every 6 (six) hours as needed for pain.    .  levothyroxine (SYNTHROID, LEVOTHROID) 25 MCG tablet Take 25 mcg by mouth daily before breakfast.    . lidocaine (LIDODERM) 5 % Place 1 patch onto the skin as needed.    . lidocaine-prilocaine (EMLA) cream Apply 1 application topically as needed. 30 g 0  . LORazepam (ATIVAN) 2 MG tablet Take 2 mg by mouth See admin instructions. Take 1 tablet (2 mg) by mouth scheduled at bedtime & take 1 tablet (2 mg) by mouth twice daily if needed for anxiety    . metoprolol tartrate (LOPRESSOR) 25 MG tablet Take 1 tablet (25 mg total) by mouth 2 (two) times daily. 180 tablet 3  . montelukast (SINGULAIR) 10 MG tablet Take 10 mg by mouth at bedtime.     . nitroGLYCERIN (NITROSTAT) 0.4 MG SL tablet Place 1 tablet (0.4 mg total) under the tongue every 5 (five) minutes as needed for chest pain. (Patient not taking: Reported on 03/03/2018) 25 tablet 12  . pantoprazole (PROTONIX) 40 MG tablet Take 1 tablet by mouth daily.    . prochlorperazine (COMPAZINE) 10 MG tablet TAKE 1 TABLET(10 MG) BY MOUTH EVERY 6 HOURS AS NEEDED FOR NAUSEA OR VOMITING 30 tablet 0  . SYMBICORT 160-4.5 MCG/ACT inhaler Take 2 puffs by mouth 2 (two) times daily.  3  . temazepam (RESTORIL) 15 MG capsule Take 1 capsule by mouth at bedtime.     No current facility-administered medications for this visit.     SURGICAL HISTORY:  Past Surgical History:  Procedure Laterality Date  . CARDIAC CATHETERIZATION  04/2008; 05/2008  . CARDIAC CATHETERIZATION N/A 08/07/2015   Procedure: Left Heart Cath and Coronary Angiography;  Surgeon: Michael Cooper, MD;  Location: [facility] INVASIVE CV LAB;  Service: Cardiovascular;  Laterality: N/A;  . CHOLECYSTECTOMY    . CORONARY ANGIOPLASTY WITH STENT PLACEMENT  04/2008 12/06/2012   "1 + 1" (12/06/2012)  . CORONARY STENT PLACEMENT  05/08/2013   DES TO LAD      DR MCALHANY  . IR IMAGING GUIDED PORT INSERTION  04/29/2018  . LEFT HEART CATHETERIZATION WITH CORONARY ANGIOGRAM N/A 12/06/2012   Procedure: LEFT HEART CATHETERIZATION WITH  CORONARY ANGIOGRAM;  Surgeon: Christopher D McAlhany, MD;  Location: [facility] CATH LAB;  Service: Cardiovascular;  Laterality: N/A;  . LEFT HEART CATHETERIZATION WITH CORONARY ANGIOGRAM N/A 05/08/2013   Procedure: LEFT HEART CATHETERIZATION WITH CORONARY ANGIOGRAM;  Surgeon: Christopher D McAlhany, MD;  Location: [facility] CATH LAB;  Service: Cardiovascular;  Laterality: N/A;  . LEFT HEART CATHETERIZATION WITH CORONARY ANGIOGRAM N/A 01/04/2014   Procedure: LEFT HEART CATHETERIZATION WITH CORONARY ANGIOGRAM;  Surgeon: Christopher D McAlhany, MD;  Location: [facility] CATH LAB;  Service: Cardiovascular;  Laterality: N/A;  . PERCUTANEOUS CORONARY STENT INTERVENTION (PCI-S)  12/06/2012   Procedure: PERCUTANEOUS CORONARY STENT INTERVENTION (PCI-S);  Surgeon: Christopher D   Angelena Form, MD;  Location: [place] CATH LAB;  Service: Cardiovascular;;  . PERCUTANEOUS CORONARY STENT INTERVENTION (PCI-S)  05/08/2013   Procedure: PERCUTANEOUS CORONARY STENT INTERVENTION (PCI-S);  Surgeon: [name], MD;  Location: [facility] CATH LAB;  Service: Cardiovascular;;  mid LAD   . TUBAL LIGATION    . VAGINAL HYSTERECTOMY    . VIDEO BRONCHOSCOPY WITH ENDOBRONCHIAL NAVIGATION N/A 02/17/2018   Procedure: VIDEO BRONCHOSCOPY WITH ENDOBRONCHIAL NAVIGATION;  Surgeon: Melrose Nakayama, MD;  Location: [place];  Service: Thoracic;  Laterality: N/A;    REVIEW OF SYSTEMS:  A comprehensive review of systems was negative except for: Constitutional: positive for fatigue Musculoskeletal: positive for muscle weakness   PHYSICAL EXAMINATION: General appearance: alert, cooperative, fatigued and no distress Head: Normocephalic, without obvious abnormality, atraumatic Neck: no adenopathy, no JVD, supple, symmetrical, trachea midline and thyroid not enlarged, symmetric, no tenderness/mass/nodules Lymph nodes: Cervical, supraclavicular, and axillary nodes normal. Resp: clear to auscultation bilaterally Back: symmetric, no curvature. ROM normal. No CVA  tenderness. Cardio: regular rate and rhythm, S1, S2 normal, no murmur, click, rub or gallop GI: soft, non-tender; bowel sounds normal; no masses,  no organomegaly Extremities: extremities normal, atraumatic, no cyanosis or edema  ECOG PERFORMANCE STATUS: 1 - Symptomatic but completely ambulatory  Blood pressure (!) 151/94, pulse (!) 108, temperature 98.2 F (36.8 C), temperature source Oral, resp. rate 18, height 5' 7" (1.702 m), weight 166 lb 3.2 oz (75.4 kg), SpO2 98 %.  LABORATORY DATA: Lab Results  Component Value Date   WBC 4.2 05/02/2018   HGB 11.9 (L) 05/02/2018   HCT 35.4 (L) 05/02/2018   MCV 89.2 05/02/2018   PLT 244 05/02/2018      Chemistry      Component Value Date/Time   NA 139 04/29/2018 1108   NA 143 01/10/2018 1629   K 4.4 04/29/2018 1108   CL 106 04/29/2018 1108   CO2 26 04/29/2018 1108   BUN 21 04/29/2018 1108   BUN 14 01/10/2018 1629   CREATININE 0.97 04/29/2018 1108   CREATININE 0.97 04/26/2018 1210   CREATININE 0.68 08/05/2015 1456      Component Value Date/Time   CALCIUM 8.9 04/29/2018 1108   ALKPHOS 117 04/26/2018 1210   AST 30 04/26/2018 1210   ALT 80 (H) 04/26/2018 1210   BILITOT 0.3 04/26/2018 1210       RADIOGRAPHIC STUDIES: Ir Imaging Guided Port Insertion  Result Date: 04/29/2018 INDICATION: 63 year old female with stage IV left lung adenocarcinoma. She presents for durable venous access for chemotherapy. EXAM: IMPLANTED PORT A CATH PLACEMENT WITH ULTRASOUND AND FLUOROSCOPIC GUIDANCE MEDICATIONS: 2 g Ancef; The antibiotic was administered within an appropriate time interval prior to skin puncture. ANESTHESIA/SEDATION: Versed 4 mg IV; Fentanyl 100 mcg IV; Moderate Sedation Time:  20 minutes The patient was continuously monitored during the procedure by the interventional radiology nurse under my direct supervision. FLUOROSCOPY TIME:  0 minutes, 18 seconds (4 mGy) COMPLICATIONS: None immediate. PROCEDURE: The right neck and chest was prepped  with chlorhexidine, and draped in the usual sterile fashion using maximum barrier technique (cap and mask, sterile gown, sterile gloves, large sterile sheet, hand hygiene and cutaneous antiseptic). Local anesthesia was attained by infiltration with 1% lidocaine with epinephrine. Ultrasound demonstrated patency of the right internal jugular vein, and this was documented with an image. Under real-time ultrasound guidance, this vein was accessed with a 21 gauge micropuncture needle and image documentation was performed. A small dermatotomy was made at the access site with an 11 scalpel.  A 0.018" wire was advanced into the SVC and the access needle exchanged for a 64F micropuncture vascular sheath. The 0.018" wire was then removed and a 0.035" wire advanced into the IVC. An appropriate location for the subcutaneous reservoir was selected below the clavicle and an incision was made through the skin and underlying soft tissues. The subcutaneous tissues were then dissected using a combination of blunt and sharp surgical technique and a pocket was formed. A single lumen power injectable portacatheter was then tunneled through the subcutaneous tissues from the pocket to the dermatotomy and the port reservoir placed within the subcutaneous pocket. The venous access site was then serially dilated and a peel away vascular sheath placed over the wire. The wire was removed and the port catheter advanced into position under fluoroscopic guidance. The catheter tip is positioned in the superior cavoatrial junction. This was documented with a spot image. The portacatheter was then tested and found to flush and aspirate well. The port was flushed with saline followed by 100 units/mL heparinized saline. The pocket was then closed in two layers using first subdermal inverted interrupted absorbable sutures followed by a running subcuticular suture. The epidermis was then sealed with Dermabond. The dermatotomy at the venous access site was  also closed with Dermabond. IMPRESSION: Successful placement of a right IJ approach Power Port with ultrasound and fluoroscopic guidance. The catheter is ready for use. Electronically Signed   By: Jacqulynn Cadet M.D.   On: 04/29/2018 15:22    ASSESSMENT AND PLAN: This is a very pleasant 63 years old white female recently diagnosed with a stage IV non-small cell lung cancer, adenocarcinoma with no actionable mutations and PDL 1 expression of 1% diagnosed in December 2019 and presented with bilateral pulmonary nodules. Her molecular studies were negative for any actionable mutations. The patient was started on systemic chemotherapy with carboplatin, Alimta and Keytruda status post 1 cycle.  She tolerated the first cycle of her treatment well except for fatigue and mild nausea. I recommended for the patient to proceed with cycle #2 tomorrow as scheduled. She will come back for follow-up visit in 3 weeks for evaluation before starting cycle #3. The patient was advised to call immediately if she has any other concerning symptoms in the interval. The patient voices understanding of current disease status and treatment options and is in agreement with the current care plan. All questions were answered. The patient knows to call the clinic with any problems, questions or concerns. We can certainly see the patient much sooner if necessary.  I spent 10 minutes counseling the patient face to face. The total time spent in the appointment was 15 minutes.  Disclaimer: This note was dictated with voice recognition software. Similar sounding words can inadvertently be transcribed and may not be corrected upon review.

## 2018-05-03 ENCOUNTER — Other Ambulatory Visit: Payer: Self-pay | Admitting: Internal Medicine

## 2018-05-03 ENCOUNTER — Inpatient Hospital Stay: Payer: Managed Care, Other (non HMO)

## 2018-05-03 ENCOUNTER — Other Ambulatory Visit: Payer: Managed Care, Other (non HMO)

## 2018-05-03 VITALS — BP 112/75 | HR 91 | Resp 18

## 2018-05-03 DIAGNOSIS — C3492 Malignant neoplasm of unspecified part of left bronchus or lung: Secondary | ICD-10-CM | POA: Diagnosis not present

## 2018-05-03 MED ORDER — SODIUM CHLORIDE 0.9 % IV SOLN
Freq: Once | INTRAVENOUS | Status: AC
  Administered 2018-05-03: 14:00:00 via INTRAVENOUS
  Filled 2018-05-03: qty 250

## 2018-05-03 MED ORDER — PALONOSETRON HCL INJECTION 0.25 MG/5ML
0.2500 mg | Freq: Once | INTRAVENOUS | Status: AC
  Administered 2018-05-03: 0.25 mg via INTRAVENOUS

## 2018-05-03 MED ORDER — SODIUM CHLORIDE 0.9 % IV SOLN
479.0000 mg | Freq: Once | INTRAVENOUS | Status: AC
  Administered 2018-05-03: 480 mg via INTRAVENOUS
  Filled 2018-05-03: qty 48

## 2018-05-03 MED ORDER — SODIUM CHLORIDE 0.9% FLUSH
10.0000 mL | INTRAVENOUS | Status: DC | PRN
  Administered 2018-05-03: 10 mL
  Filled 2018-05-03: qty 10

## 2018-05-03 MED ORDER — HEPARIN SOD (PORK) LOCK FLUSH 100 UNIT/ML IV SOLN
500.0000 [IU] | Freq: Once | INTRAVENOUS | Status: AC | PRN
  Administered 2018-05-03: 500 [IU]
  Filled 2018-05-03: qty 5

## 2018-05-03 MED ORDER — SODIUM CHLORIDE 0.9 % IV SOLN
200.0000 mg | Freq: Once | INTRAVENOUS | Status: AC
  Administered 2018-05-03: 200 mg via INTRAVENOUS
  Filled 2018-05-03: qty 8

## 2018-05-03 MED ORDER — SODIUM CHLORIDE 0.9 % IV SOLN
Freq: Once | INTRAVENOUS | Status: AC
  Administered 2018-05-03: 14:00:00 via INTRAVENOUS
  Filled 2018-05-03: qty 5

## 2018-05-03 MED ORDER — PALONOSETRON HCL INJECTION 0.25 MG/5ML
INTRAVENOUS | Status: AC
  Filled 2018-05-03: qty 5

## 2018-05-03 MED ORDER — SODIUM CHLORIDE 0.9 % IV SOLN
525.0000 mg/m2 | Freq: Once | INTRAVENOUS | Status: AC
  Administered 2018-05-03: 1000 mg via INTRAVENOUS
  Filled 2018-05-03: qty 40

## 2018-05-03 NOTE — Patient Instructions (Signed)
[facility] Discharge Instructions for Patients Receiving Chemotherapy  Today you received the following chemotherapy agents Keytruda Alimta, Carboplatin.  To help prevent nausea and vomiting after your treatment, we encourage you to take your nausea medication as directed.  If you develop nausea and vomiting that is not controlled by your nausea medication, call the clinic.   BELOW ARE SYMPTOMS THAT SHOULD BE REPORTED IMMEDIATELY:  *FEVER GREATER THAN 100.5 F  *CHILLS WITH OR WITHOUT FEVER  NAUSEA AND VOMITING THAT IS NOT CONTROLLED WITH YOUR NAUSEA MEDICATION  *UNUSUAL SHORTNESS OF BREATH  *UNUSUAL BRUISING OR BLEEDING  TENDERNESS IN MOUTH AND THROAT WITH OR WITHOUT PRESENCE OF ULCERS  *URINARY PROBLEMS  *BOWEL PROBLEMS  UNUSUAL RASH Items with * indicate a potential emergency and should be followed up as soon as possible.  Feel free to call the clinic should you have any questions or concerns. The clinic phone number is [phone].  Please show the [facility] at check-in to the Emergency Department and triage nurse.

## 2018-05-09 ENCOUNTER — Other Ambulatory Visit: Payer: Self-pay | Admitting: Internal Medicine

## 2018-05-09 ENCOUNTER — Encounter (HOSPITAL_COMMUNITY): Payer: Self-pay

## 2018-05-09 ENCOUNTER — Inpatient Hospital Stay: Payer: Managed Care, Other (non HMO)

## 2018-05-09 ENCOUNTER — Emergency Department (HOSPITAL_COMMUNITY)
Admission: EM | Admit: 2018-05-09 | Discharge: 2018-05-09 | Disposition: A | Discharge status: Home / Self Care | Payer: Managed Care, Other (non HMO) | Attending: Emergency Medicine | Admitting: Emergency Medicine

## 2018-05-09 ENCOUNTER — Telehealth: Payer: Self-pay | Admitting: *Deleted

## 2018-05-09 ENCOUNTER — Other Ambulatory Visit: Payer: Self-pay

## 2018-05-09 DIAGNOSIS — J45909 Unspecified asthma, uncomplicated: Secondary | ICD-10-CM | POA: Insufficient documentation

## 2018-05-09 DIAGNOSIS — I10 Essential (primary) hypertension: Secondary | ICD-10-CM | POA: Insufficient documentation

## 2018-05-09 DIAGNOSIS — R5381 Other malaise: Secondary | ICD-10-CM | POA: Diagnosis present

## 2018-05-09 DIAGNOSIS — I2510 Atherosclerotic heart disease of native coronary artery without angina pectoris: Secondary | ICD-10-CM | POA: Diagnosis not present

## 2018-05-09 DIAGNOSIS — Z91040 Latex allergy status: Secondary | ICD-10-CM | POA: Diagnosis not present

## 2018-05-09 DIAGNOSIS — Z87891 Personal history of nicotine dependence: Secondary | ICD-10-CM | POA: Insufficient documentation

## 2018-05-09 DIAGNOSIS — E860 Dehydration: Secondary | ICD-10-CM

## 2018-05-09 DIAGNOSIS — Z79899 Other long term (current) drug therapy: Secondary | ICD-10-CM | POA: Diagnosis not present

## 2018-05-09 LAB — URINALYSIS, ROUTINE W REFLEX MICROSCOPIC
Bilirubin Urine: NEGATIVE
Cellular Cast, UA: 30
Glucose, UA: 50 mg/dL — AB
Ketones, ur: 80 mg/dL — AB
Leukocytes,Ua: NEGATIVE
Nitrite: NEGATIVE
Protein, ur: >=300 mg/dL — AB
Specific Gravity, Urine: 1.02 (ref 1.005–1.030)
pH: 5 (ref 5.0–8.0)

## 2018-05-09 LAB — COMPREHENSIVE METABOLIC PANEL
ALK PHOS: 190 U/L — AB (ref 38–126)
ALT: 159 U/L — ABNORMAL HIGH (ref 0–44)
AST: 62 U/L — ABNORMAL HIGH (ref 15–41)
Albumin: 4.3 g/dL (ref 3.5–5.0)
Anion gap: 14 (ref 5–15)
BUN: 27 mg/dL — ABNORMAL HIGH (ref 8–23)
CO2: 21 mmol/L — AB (ref 22–32)
Calcium: 9.5 mg/dL (ref 8.9–10.3)
Chloride: 100 mmol/L (ref 98–111)
Creatinine, Ser: 0.96 mg/dL (ref 0.44–1.00)
GFR calc Af Amer: >60 mL/min (ref >60)
GFR calc non Af Amer: >60 mL/min (ref >60)
Glucose, Bld: 138 mg/dL — ABNORMAL HIGH (ref 70–99)
Potassium: 4.4 mmol/L (ref 3.5–5.1)
Sodium: 135 mmol/L (ref 135–145)
Total Bilirubin: 1.1 mg/dL (ref 0.3–1.2)
Total Protein: 7.6 g/dL (ref 6.5–8.1)

## 2018-05-09 LAB — CBC
HCT: 39.9 % (ref 36.0–46.0)
Hemoglobin: 13 g/dL (ref 12.0–15.0)
MCH: 28.8 pg (ref 26.0–34.0)
MCHC: 32.6 g/dL (ref 30.0–36.0)
MCV: 88.5 fL (ref 80.0–100.0)
Platelets: 237 10*3/uL (ref 150–400)
RBC: 4.51 MIL/uL (ref 3.87–5.11)
RDW: 13.1 % (ref 11.5–15.5)
WBC: 7.1 10*3/uL (ref 4.0–10.5)
nRBC: 0 % (ref 0.0–0.2)

## 2018-05-09 LAB — LIPASE, BLOOD: Lipase: 37 U/L (ref 11–51)

## 2018-05-09 MED ORDER — SODIUM CHLORIDE 0.9% FLUSH
3.0000 mL | Freq: Once | INTRAVENOUS | Status: AC
  Administered 2018-05-09: 3 mL via INTRAVENOUS

## 2018-05-09 MED ORDER — ACETAMINOPHEN 325 MG PO TABS
650.0000 mg | ORAL_TABLET | Freq: Once | ORAL | Status: AC
  Administered 2018-05-09: 650 mg via ORAL
  Filled 2018-05-09: qty 2

## 2018-05-09 NOTE — ED Notes (Signed)
Pt was able to eat only a few bites of graham crackers, but they made her feel nauseas.

## 2018-05-09 NOTE — ED Notes (Signed)
Family aware we need urine sample. Pure wick placed on patient by Terance Ice.

## 2018-05-09 NOTE — ED Notes (Signed)
Bed: WA01 Expected date:  Expected time:  Means of arrival:  Comments: EMS sob n/v/d ams

## 2018-05-09 NOTE — ED Triage Notes (Signed)
Patient arrived iva [facility] from home.   Called out for ems for general sickness   C/O n/v/d and altered mental status since last night   Husband states patient was not acting her normal last night  A/O X2 to person and placed  disoriented to time and event  Patient initially denied diarrhea but ems states once patient got out of bed bed was soiled   EMS interventions:  37m Albuterol breathing treatment  Hx. Lung cancer  Last chemo Tuesday   [facility]

## 2018-05-09 NOTE — ED Notes (Signed)
Pt has been drinking water successfully.

## 2018-05-09 NOTE — ED Provider Notes (Signed)
[name]   CSN: [id number] Arrival date & time: 05/09/18  1355    History   Chief Complaint Chief Complaint  Patient presents with  . Emesis  . Diarrhea  . Altered Mental Status    HPI [name] is a 64 y.o. female.     HPI   She presents for evaluation of malaise since chemotherapy, 1 week ago.  She is gotten gradually worse, which has happened several times before with chemotherapy.  She is weak, confused, having decreased appetite and been vomiting.  She is here with family members including husband who give most of the history.  There is been no cough or complaint of shortness of breath.  She presents for evaluation by EMS.  She did not eat or drink much today.  There is been no diarrhea.  There are no other known modifying factors.   Past Medical History:  Diagnosis Date  . Anemia    "long time ago" (12/06/2012)  . Arthritis    "right knee real bad; in my hands bad" (12/06/2012)  . Asthma   . Coronary artery disease    a. s/p Xience DES to [facility] 04/2008;  b. LHC 05/2008: Proximal RCA 25%, mid RCA stent patent.  c. Anormal nuc 2014 -> s/p LHC with severe mRCA stenosis s/p DES. d. Cath 04/2013 s/p DES to LAD.  e. LHC 08/2015 was stable.  . Depression    "years ago" (12/06/2012)  . Fibromyalgia   . GERD (gastroesophageal reflux disease)   . H/O hiatal hernia   . Hepatitis    "not A, B, or C" (12/06/2012)  . Hyperlipidemia    intol to statins and other chol agents due to elevated LFTs  . Hypertension   . Lung nodules    Bilateral  . Migraines    "when I was younger" (12/06/2012)  . PONV (postoperative nausea and vomiting)    "Very bad"  . Premature atrial contractions   . PVC's (premature ventricular contractions)   . Sinus headache   . SVT (supraventricular tachycardia) [facility])     Patient Active Problem List   Diagnosis Date Noted  . Port-A-Cath in place 05/02/2018  . Adenocarcinoma of left lung, stage 4 ([place])  04/05/2018  . Encounter for antineoplastic chemotherapy 04/05/2018  . Encounter for antineoplastic immunotherapy 04/05/2018  . Goals of care, counseling/discussion 04/05/2018  . Tobacco abuse counseling 03/05/2018  . PVC's (premature ventricular contractions)   . SVT (supraventricular tachycardia) ([place])   . Premature atrial contractions   . Unstable angina ([place]) 12/07/2012  . Tachycardia 02/19/2009  . CAD, NATIVE VESSEL 06/04/2008  . Hyperlipemia 05/29/2008  . DEPRESSION 05/29/2008  . ASTHMA 05/29/2008  . GERD 05/29/2008  . Irritable bowel syndrome 05/29/2008  . DEGENERATIVE JOINT DISEASE, LUMBAR SPINE 05/29/2008  . FIBROMYALGIA 05/29/2008  . CHEST PAIN-PRECORDIAL 05/07/2008    Past Surgical History:  Procedure Laterality Date  . CARDIAC CATHETERIZATION  04/2008; 05/2008  . CARDIAC CATHETERIZATION N/A 08/07/2015   Procedure: Left Heart Cath and Coronary Angiography;  Surgeon: Sherren Mocha, MD;  Location: [place] CV LAB;  Service: Cardiovascular;  Laterality: N/A;  . CHOLECYSTECTOMY    . CORONARY ANGIOPLASTY WITH STENT PLACEMENT  04/2008 12/06/2012   "1 + 1" (12/06/2012)  . CORONARY STENT PLACEMENT  05/08/2013   DES TO LAD      DR MCALHANY  . IR IMAGING GUIDED PORT INSERTION  04/29/2018  . LEFT HEART CATHETERIZATION WITH CORONARY ANGIOGRAM N/A 12/06/2012  Procedure: LEFT HEART CATHETERIZATION WITH CORONARY ANGIOGRAM;  Surgeon: [name], MD;  Location: [facility] CATH LAB;  Service: Cardiovascular;  Laterality: N/A;  . LEFT HEART CATHETERIZATION WITH CORONARY ANGIOGRAM N/A 05/08/2013   Procedure: LEFT HEART CATHETERIZATION WITH CORONARY ANGIOGRAM;  Surgeon: [name], MD;  Location: [facility] CATH LAB;  Service: Cardiovascular;  Laterality: N/A;  . LEFT HEART CATHETERIZATION WITH CORONARY ANGIOGRAM N/A 01/04/2014   Procedure: LEFT HEART CATHETERIZATION WITH CORONARY ANGIOGRAM;  Surgeon: [name], MD;  Location: [facility] CATH LAB;  Service: Cardiovascular;   Laterality: N/A;  . PERCUTANEOUS CORONARY STENT INTERVENTION (PCI-S)  12/06/2012   Procedure: PERCUTANEOUS CORONARY STENT INTERVENTION (PCI-S);  Surgeon: [name], MD;  Location: [facility] CATH LAB;  Service: Cardiovascular;;  . PERCUTANEOUS CORONARY STENT INTERVENTION (PCI-S)  05/08/2013   Procedure: PERCUTANEOUS CORONARY STENT INTERVENTION (PCI-S);  Surgeon: [name], MD;  Location: [facility] CATH LAB;  Service: Cardiovascular;;  mid LAD   . TUBAL LIGATION    . VAGINAL HYSTERECTOMY    . VIDEO BRONCHOSCOPY WITH ENDOBRONCHIAL NAVIGATION N/A 02/17/2018   Procedure: VIDEO BRONCHOSCOPY WITH ENDOBRONCHIAL NAVIGATION;  Surgeon: Melrose Nakayama, MD;  Location: [place];  Service: Thoracic;  Laterality: N/A;     OB History   No obstetric history on file.      Home Medications    Prior to Admission medications   Medication Sig Start Date End Date Taking? Authorizing Provider  albuterol (PROAIR HFA) 108 (90 Base) MCG/ACT inhaler Inhale 1 puff into the lungs every 6 (six) hours as needed (wheezing/shortness of breath.).     [provider]  albuterol (PROVENTIL) (5 MG/ML) 0.5% nebulizer solution Take 2.5 mg by nebulization every 6 (six) hours as needed for wheezing.    [provider]  amLODipine (NORVASC) 5 MG tablet Take 1.5 tablets (7.5 mg total) by mouth daily. Patient taking differently: Take 7.5 mg by mouth every evening.  01/10/18 04/10/18  [name], MD  aspirin 81 MG chewable tablet Chew 1 tablet (81 mg total) by mouth daily. 12/07/12   Barrett, Evelene Croon, PA-C  BELSOMRA 20 MG TABS Take 20 mg by mouth at bedtime as needed for sleep. 04/29/18   [provider]  buPROPion (WELLBUTRIN SR) 150 MG 12 hr tablet Take 150-300 mg by mouth See admin instructions. Take 2 tablets (300 mg) by mouth daily in the morning & take 1 tablet (150 mg) by mouth in the evening.    [provider]  buPROPion (WELLBUTRIN XL) 150 MG 24 hr tablet Take 150 mg  by mouth. 04/18/18   [provider]  clopidogrel (PLAVIX) 75 MG tablet Take 1 tablet (75 mg total) by mouth daily. MUST SCHEDULE APPOINTMENT FOR FURTHER REFILLS 09/04/16   [name], MD  DULoxetine (CYMBALTA) 60 MG capsule Take 120 mg by mouth daily. 04/28/18   [provider]  ergocalciferol (VITAMIN D2) 50000 UNITS capsule Take 50,000 Units by mouth every Wednesday.     [provider]  folic acid (FOLVITE) 1 MG tablet Take 1 tablet (1 mg total) by mouth daily. 04/05/18   Curt Bears, MD  gabapentin (NEURONTIN) 100 MG capsule Take 1 capsule by mouth daily. 04/18/18   [provider]  guaifenesin (ROBITUSSIN) 100 MG/5ML syrup Take 100-200 mg by mouth 3 (three) times daily as needed for cough.    [provider]  HYDROcodone-acetaminophen (NORCO) 7.5-325 MG per tablet Take 1 tablet by mouth every 6 (six) hours as needed for pain.  [provider]  levothyroxine (SYNTHROID, LEVOTHROID) 25 MCG tablet Take 25 mcg by mouth daily before breakfast.    [provider]  lidocaine (LIDODERM) 5 % Place 1 patch onto the skin as needed.    [provider]  lidocaine-prilocaine (EMLA) cream Apply 1 application topically as needed. 04/19/18   Heilingoetter, Cassandra L, PA-C  lidocaine-prilocaine (EMLA) cream lidocaine-prilocaine 2.5 %-2.5 % topical cream  APP ONE APPLICATION TOPICALLY PRN    [provider]  LORazepam (ATIVAN) 2 MG tablet Take 2 mg by mouth See admin instructions. Take 1 tablet (2 mg) by mouth scheduled at bedtime & take 1 tablet (2 mg) by mouth twice daily if needed for anxiety    [provider]  metoprolol tartrate (LOPRESSOR) 25 MG tablet Take 1 tablet (25 mg total) by mouth 2 (two) times daily. 02/11/18   [name], MD  montelukast (SINGULAIR) 10 MG tablet Take 10 mg by mouth at bedtime.     [provider]  nitroGLYCERIN (NITROSTAT) 0.4 MG SL tablet Place 1 tablet  (0.4 mg total) under the tongue every 5 (five) minutes as needed for chest pain. Patient not taking: Reported on 03/03/2018 12/07/12   Barrett, Evelene Croon, PA-C  pantoprazole (PROTONIX) 40 MG tablet Take 1 tablet by mouth daily. 07/20/11   [provider]  prochlorperazine (COMPAZINE) 10 MG tablet TAKE 1 TABLET(10 MG) BY MOUTH EVERY 6 HOURS AS NEEDED FOR NAUSEA OR VOMITING Patient taking differently: Take 10 mg by mouth every 6 (six) hours as needed for nausea or vomiting.  05/09/18   Curt Bears, MD  SYMBICORT 160-4.5 MCG/ACT inhaler Take 2 puffs by mouth 2 (two) times daily. 12/08/17   [provider]  temazepam (RESTORIL) 15 MG capsule Take 1 capsule by mouth at bedtime.    [provider]  Vitamin D, Ergocalciferol, (DRISDOL) 1.25 MG (50000 UT) CAPS capsule Take 50,000 Units by mouth once a week.    [provider]    Family History Family History  Problem Relation Age of Onset  . CAD Neg Hx     Social History Social History   Tobacco Use  . Smoking status: Former Smoker    Packs/day: 0.50    Years: 30.00    Pack years: 15.00    Types: Cigarettes  . Smokeless tobacco: Never Used  . Tobacco comment: 12/06/2012 "quit smoking cigarettes ~ 8 yr ago"  Substance Use Topics  . Alcohol use: No  . Drug use: No     Allergies   Ciprofloxacin; Levofloxacin; Statins; Tricor [fenofibrate]; Latex; Codeine; and Metoprolol   Review of Systems Review of Systems  All other systems reviewed and are negative.    Physical Exam Updated Vital Signs BP (!) 143/95 (BP Location: Left Arm)   Pulse (!) 110   Temp 99.5 F (37.5 C) (Rectal)   Resp 20   SpO2 100%   Physical Exam Vitals signs and nursing [name] reviewed.  Constitutional:      General: She is in acute distress.     Appearance: She is well-developed and normal weight. She is ill-appearing. She is not toxic-appearing or diaphoretic.  HENT:     Head: Normocephalic and atraumatic.     Right Ear:  External ear normal.     Left Ear: External ear normal.     Mouth/Throat:     Mouth: Mucous membranes are dry.     Pharynx: No oropharyngeal exudate or posterior oropharyngeal erythema.  Eyes:  Conjunctiva/sclera: Conjunctivae normal.     Pupils: Pupils are equal, round, and reactive to light.  Neck:     Musculoskeletal: Normal range of motion and neck supple.     Trachea: Phonation normal.  Cardiovascular:     Rate and Rhythm: Normal rate and regular rhythm.     Heart sounds: Normal heart sounds.  Pulmonary:     Effort: Pulmonary effort is normal.     Breath sounds: Normal breath sounds.  Abdominal:     General: There is no distension.     Palpations: Abdomen is soft. There is no mass.     Tenderness: There is no abdominal tenderness.     Hernia: No hernia is present.  Musculoskeletal: Normal range of motion.        General: No swelling or tenderness.     Right lower leg: No edema.     Left lower leg: No edema.  Skin:    General: Skin is warm and dry.  Neurological:     Mental Status: She is alert and oriented to person, place, and time.     Cranial Nerves: No cranial nerve deficit.     Sensory: No sensory deficit.     Motor: No abnormal muscle tone.     Coordination: Coordination normal.  Psychiatric:        Mood and Affect: Mood normal.        Behavior: Behavior normal.      ED Treatments / Results  Labs (all labs ordered are listed, but only abnormal results are displayed) Labs Reviewed  COMPREHENSIVE METABOLIC PANEL - Abnormal; Notable for the following components:      Result Value   CO2 21 (*)    Glucose, Bld 138 (*)    BUN 27 (*)    AST 62 (*)    ALT 159 (*)    Alkaline Phosphatase 190 (*)    All other components within normal limits  URINALYSIS, ROUTINE W REFLEX MICROSCOPIC - Abnormal; Notable for the following components:   APPearance HAZY (*)    Glucose, UA 50 (*)    Hgb urine dipstick LARGE (*)    Ketones, ur 80 (*)    Protein, ur >=300 (*)      Bacteria, UA FEW (*)    All other components within normal limits  LIPASE, BLOOD  CBC    EKG EKG Interpretation  Date/Time:  Monday May 09 2018 14:14:50 EDT Ventricular Rate:  127 PR Interval:    QRS Duration: 81 QT Interval:  300 QTC Calculation: 436 R Axis:   84 Text Interpretation:  Sinus tachycardia Consider right atrial enlargement Borderline right axis deviation Confirmed by Veryl Speak [phone]) on 05/09/2018 2:19:52 PM   Radiology No results found.  Procedures Procedures (including critical care time)  Medications Ordered in ED Medications  sodium chloride flush (NS) 0.9 % injection 3 mL (3 mLs Intravenous Given 05/09/18 1416)  acetaminophen (TYLENOL) tablet 650 mg (650 mg Oral Given 05/09/18 1630)     Initial Impression / Assessment and Plan / ED Course  I have reviewed the triage vital signs and the nursing notes.  Pertinent labs & imaging results that were available during my care of the patient were reviewed by me and considered in my medical decision making (see chart for details).  Clinical Course as of May 08 1956  Mon May 09, 2018  1727 Normal except dipstick with increased glucose, hemoglobin, ketones and protein.  Microscopic with slight increased WBCs and few bacteria.  Urinalysis, Routine w reflex microscopic(!) [EW]  1727 Normal except CO2 low, glucose high, BUN high, AST high, ALT high, alkaline phosphatase high  Comprehensive metabolic panel(!) [EW]  1610 Normal  Lipase, blood [EW]  1728 Normal  CBC [EW]    Clinical Course User Index [EW] Daleen Bo, MD        Patient Vitals for the past 24 hrs:  BP Temp Temp src Pulse Resp SpO2  05/09/18 1911 - 99.5 F (37.5 C) Rectal - - -  05/09/18 1834 (!) 143/95 99.2 F (37.3 C) Oral (!) 110 20 100 %  05/09/18 1600 (!) 132/115 - - (!) 124 (!) 22 100 %  05/09/18 1523 - 100.3 F (37.9 C) Rectal - - -  05/09/18 1451 - - - (!) 122 20 98 %  05/09/18 1446 - - - (!) 125 20 98 %  05/09/18 1410 -  98.4 F (36.9 C) Oral - (!) 36 -  05/09/18 1408 (!) 140/98 99.2 F (37.3 C) Oral - (!) 30 97 %  05/09/18 1407 - - - - - 97 %  05/09/18 1406 - - - - - 97 %    6:08 PM Reevaluation with update and discussion. After initial assessment and treatment, an updated evaluation reveals she is feeling better at this time and would like to try eating.  Patient and family members updated on findings.  Nurse was asked to give the patient food and water. Daleen Bo   7:50 PM-patient looks more comfortable and appears brighter.  Repeat temperature is normal.  Findings discussed with the patient, she wants to go home.  Medical Decision Making: Malaise, nonspecific, with reassuring lab/imaging evaluation.  After treatment with IV fluids she appears more comfortable and is able to tolerate some liquids and solids.  Findings discussed with patient and husband, they are willing to go home.  Doubt serious bacterial infection, metabolic instability or impending vascular collapse.  CRITICAL CARE-no Performed by: Daleen Bo  Nursing Notes Reviewed/ Care Coordinated Applicable Imaging Reviewed Interpretation of Laboratory Data incorporated into ED treatment  The patient appears reasonably screened and/or stabilized for discharge and I doubt any other medical condition or other [facility] requiring further screening, evaluation, or treatment in the ED at this time prior to discharge.  Plan: Home Medications-continue usual, Tylenol if needed for fever; Home Treatments-push fluids and food; return here if the recommended treatment, does not improve the symptoms; Recommended follow up-PCP, oncology or return here if needed.   Final Clinical Impressions(s) / ED Diagnoses   Final diagnoses:  Dehydration  [facility]    ED Discharge Orders    None       Daleen Bo, MD 05/09/18 1958

## 2018-05-09 NOTE — Discharge Instructions (Signed)
Try to drink plenty of fluids, and eat as much as you can.  Fever you can take Tylenol every 4 hours.  Return here, if needed, for problems.

## 2018-05-09 NOTE — ED Notes (Signed)
Encouraged patient to void.

## 2018-05-09 NOTE — Telephone Encounter (Signed)
Received TC from a friend of the patient who states she is at the patient's home now.[name] states that the patient is c/o feeling very cold. Unknown if pt has temp as pt does not have a thermometer.  C/o ongoing nausea and vomiting over the weekend, not taking in much food or fluids.  She is taking compazine ATC. Her friend states she feels like pt is confused,  Pt has a lab appt today but could not get out of bed to come here. Advised her friend that pt may be dehydrated and needs to be evaluated. If pt cannot come here then EMS should be called and take pt to ED. Friend voiced understanding and will make arrangements for pt to go to ED. Encouraged friend to contact us and let us know how [name] is doing.

## 2018-05-10 ENCOUNTER — Inpatient Hospital Stay (HOSPITAL_COMMUNITY)
Admission: EM | Admit: 2018-05-10 | Discharge: 2018-05-14 | DRG: 683 | Disposition: A | Discharge status: Home / Self Care | Payer: Managed Care, Other (non HMO) | Attending: Internal Medicine | Admitting: Internal Medicine

## 2018-05-10 ENCOUNTER — Other Ambulatory Visit: Payer: Self-pay

## 2018-05-10 ENCOUNTER — Encounter (HOSPITAL_COMMUNITY): Payer: Self-pay | Admitting: Emergency Medicine

## 2018-05-10 DIAGNOSIS — Z7982 Long term (current) use of aspirin: Secondary | ICD-10-CM

## 2018-05-10 DIAGNOSIS — Z79891 Long term (current) use of opiate analgesic: Secondary | ICD-10-CM

## 2018-05-10 DIAGNOSIS — I471 Supraventricular tachycardia: Secondary | ICD-10-CM | POA: Diagnosis present

## 2018-05-10 DIAGNOSIS — I10 Essential (primary) hypertension: Secondary | ICD-10-CM | POA: Diagnosis present

## 2018-05-10 DIAGNOSIS — C7801 Secondary malignant neoplasm of right lung: Secondary | ICD-10-CM | POA: Diagnosis present

## 2018-05-10 DIAGNOSIS — J45909 Unspecified asthma, uncomplicated: Secondary | ICD-10-CM | POA: Diagnosis present

## 2018-05-10 DIAGNOSIS — Z87891 Personal history of nicotine dependence: Secondary | ICD-10-CM

## 2018-05-10 DIAGNOSIS — Z85118 Personal history of other malignant neoplasm of bronchus and lung: Secondary | ICD-10-CM

## 2018-05-10 DIAGNOSIS — N179 Acute kidney failure, unspecified: Secondary | ICD-10-CM | POA: Diagnosis not present

## 2018-05-10 DIAGNOSIS — A084 Viral intestinal infection, unspecified: Secondary | ICD-10-CM | POA: Diagnosis present

## 2018-05-10 DIAGNOSIS — R112 Nausea with vomiting, unspecified: Secondary | ICD-10-CM | POA: Diagnosis present

## 2018-05-10 DIAGNOSIS — E039 Hypothyroidism, unspecified: Secondary | ICD-10-CM | POA: Diagnosis present

## 2018-05-10 DIAGNOSIS — Z9049 Acquired absence of other specified parts of digestive tract: Secondary | ICD-10-CM

## 2018-05-10 DIAGNOSIS — Z79890 Hormone replacement therapy (postmenopausal): Secondary | ICD-10-CM

## 2018-05-10 DIAGNOSIS — Z90710 Acquired absence of both cervix and uterus: Secondary | ICD-10-CM

## 2018-05-10 DIAGNOSIS — Z955 Presence of coronary angioplasty implant and graft: Secondary | ICD-10-CM

## 2018-05-10 DIAGNOSIS — D638 Anemia in other chronic diseases classified elsewhere: Secondary | ICD-10-CM | POA: Diagnosis present

## 2018-05-10 DIAGNOSIS — M797 Fibromyalgia: Secondary | ICD-10-CM | POA: Diagnosis present

## 2018-05-10 DIAGNOSIS — I2510 Atherosclerotic heart disease of native coronary artery without angina pectoris: Secondary | ICD-10-CM | POA: Diagnosis present

## 2018-05-10 DIAGNOSIS — C3492 Malignant neoplasm of unspecified part of left bronchus or lung: Secondary | ICD-10-CM | POA: Diagnosis present

## 2018-05-10 DIAGNOSIS — R197 Diarrhea, unspecified: Secondary | ICD-10-CM

## 2018-05-10 DIAGNOSIS — Z79899 Other long term (current) drug therapy: Secondary | ICD-10-CM

## 2018-05-10 DIAGNOSIS — E785 Hyperlipidemia, unspecified: Secondary | ICD-10-CM | POA: Diagnosis present

## 2018-05-10 DIAGNOSIS — F329 Major depressive disorder, single episode, unspecified: Secondary | ICD-10-CM | POA: Diagnosis present

## 2018-05-10 DIAGNOSIS — C7802 Secondary malignant neoplasm of left lung: Secondary | ICD-10-CM | POA: Diagnosis present

## 2018-05-10 DIAGNOSIS — K219 Gastro-esophageal reflux disease without esophagitis: Secondary | ICD-10-CM | POA: Diagnosis present

## 2018-05-10 LAB — COMPREHENSIVE METABOLIC PANEL
ALT: 144 U/L — ABNORMAL HIGH (ref 0–44)
AST: 63 U/L — ABNORMAL HIGH (ref 15–41)
Albumin: 4.7 g/dL (ref 3.5–5.0)
Alkaline Phosphatase: 167 U/L — ABNORMAL HIGH (ref 38–126)
Anion gap: 14 (ref 5–15)
BUN: 47 mg/dL — ABNORMAL HIGH (ref 8–23)
CO2: 24 mmol/L (ref 22–32)
Calcium: 9.7 mg/dL (ref 8.9–10.3)
Chloride: 97 mmol/L — ABNORMAL LOW (ref 98–111)
Creatinine, Ser: 1.51 mg/dL — ABNORMAL HIGH (ref 0.44–1.00)
GFR calc Af Amer: 42 mL/min — ABNORMAL LOW (ref >60)
GFR, EST NON AFRICAN AMERICAN: 36 mL/min — AB (ref >60)
Glucose, Bld: 192 mg/dL — ABNORMAL HIGH (ref 70–99)
Potassium: 4.2 mmol/L (ref 3.5–5.1)
Sodium: 135 mmol/L (ref 135–145)
Total Bilirubin: 0.8 mg/dL (ref 0.3–1.2)
Total Protein: 8.1 g/dL (ref 6.5–8.1)

## 2018-05-10 LAB — CBC
HEMATOCRIT: 40.8 % (ref 36.0–46.0)
Hemoglobin: 13.5 g/dL (ref 12.0–15.0)
MCH: 29.1 pg (ref 26.0–34.0)
MCHC: 33.1 g/dL (ref 30.0–36.0)
MCV: 87.9 fL (ref 80.0–100.0)
Platelets: 200 10*3/uL (ref 150–400)
RBC: 4.64 MIL/uL (ref 3.87–5.11)
RDW: 13.3 % (ref 11.5–15.5)
WBC: 6.1 10*3/uL (ref 4.0–10.5)
nRBC: 0 % (ref 0.0–0.2)

## 2018-05-10 LAB — LIPASE, BLOOD: LIPASE: 37 U/L (ref 11–51)

## 2018-05-10 MED ORDER — SODIUM CHLORIDE 0.9 % IV BOLUS
1000.0000 mL | Freq: Once | INTRAVENOUS | Status: AC
  Administered 2018-05-10: 1000 mL via INTRAVENOUS

## 2018-05-10 MED ORDER — ONDANSETRON HCL 4 MG/2ML IJ SOLN
4.0000 mg | Freq: Once | INTRAMUSCULAR | Status: AC
  Administered 2018-05-10: 4 mg via INTRAVENOUS
  Filled 2018-05-10: qty 2

## 2018-05-10 MED ORDER — ONDANSETRON 4 MG PO TBDP
4.0000 mg | ORAL_TABLET | Freq: Once | ORAL | Status: AC | PRN
  Administered 2018-05-10: 4 mg via ORAL
  Filled 2018-05-10: qty 1

## 2018-05-10 MED ORDER — SODIUM CHLORIDE 0.9% FLUSH
3.0000 mL | Freq: Once | INTRAVENOUS | Status: AC
  Administered 2018-05-11: 3 mL via INTRAVENOUS

## 2018-05-10 MED ORDER — FENTANYL CITRATE (PF) 100 MCG/2ML IJ SOLN
50.0000 ug | Freq: Once | INTRAMUSCULAR | Status: AC
  Administered 2018-05-10: 50 ug via INTRAVENOUS
  Filled 2018-05-10: qty 2

## 2018-05-10 NOTE — ED Provider Notes (Signed)
[facility] DEPT Provider Note   CSN: [id number] Arrival date & time: 05/10/18  1857    History   Chief Complaint Chief Complaint  Patient presents with  . Emesis  . Diarrhea    HPI [name] is a 63 y.o. female.     Patient with past medical history remarkable for stage IV lung cancer on chemotherapy, presents to the emergency department with a chief complaint of nausea, vomiting, diarrhea.  She states that she has been feeling this way since for about a week following her chemotherapy.  This is happened to her in the past.  He was seen last night for the same, but states that her symptoms have continued to worsen and she has not been able to tolerate any oral intake today, so she returned to the emergency department.  She states that last night she was told she could be admitted or discharged depending on how she felt, she states that she elected to go home, but now believes she made a mistake.  She is uncertain when the last time she urinated was, but does not recall urinating today.  She does also report severe intermittent right flank and right lower quadrant pain.  Past surgical history includes cholecystectomy.  The history is provided by the patient. No language interpreter was used.    Past Medical History:  Diagnosis Date  . Anemia    "long time ago" (12/06/2012)  . Arthritis    "right knee real bad; in my hands bad" (12/06/2012)  . Asthma   . Coronary artery disease    a. s/p Xience DES to [facility] 04/2008;  b. LHC 05/2008: Proximal RCA 25%, mid RCA stent patent.  c. Anormal nuc 2014 -> s/p LHC with severe mRCA stenosis s/p DES. d. Cath 04/2013 s/p DES to LAD.  e. LHC 08/2015 was stable.  . Depression    "years ago" (12/06/2012)  . Fibromyalgia   . GERD (gastroesophageal reflux disease)   . H/O hiatal hernia   . Hepatitis    "not A, B, or C" (12/06/2012)  . Hyperlipidemia    intol to statins and other chol agents due to elevated LFTs  .  Hypertension   . Lung nodules    Bilateral  . Migraines    "when I was younger" (12/06/2012)  . PONV (postoperative nausea and vomiting)    "Very bad"  . Premature atrial contractions   . PVC's (premature ventricular contractions)   . Sinus headache   . SVT (supraventricular tachycardia) [facility])     Patient Active Problem List   Diagnosis Date Noted  . Port-A-Cath in place 05/02/2018  . Adenocarcinoma of left lung, stage 4 ([place]) 04/05/2018  . Encounter for antineoplastic chemotherapy 04/05/2018  . Encounter for antineoplastic immunotherapy 04/05/2018  . Goals of care, counseling/discussion 04/05/2018  . Tobacco abuse counseling 03/05/2018  . PVC's (premature ventricular contractions)   . SVT (supraventricular tachycardia) ([place])   . Premature atrial contractions   . Unstable angina ([place]) 12/07/2012  . Tachycardia 02/19/2009  . CAD, NATIVE VESSEL 06/04/2008  . Hyperlipemia 05/29/2008  . DEPRESSION 05/29/2008  . ASTHMA 05/29/2008  . GERD 05/29/2008  . Irritable bowel syndrome 05/29/2008  . DEGENERATIVE JOINT DISEASE, LUMBAR SPINE 05/29/2008  . FIBROMYALGIA 05/29/2008  . CHEST PAIN-PRECORDIAL 05/07/2008    Past Surgical History:  Procedure Laterality Date  . CARDIAC CATHETERIZATION  04/2008; 05/2008  . CARDIAC CATHETERIZATION N/A 08/07/2015   Procedure: Left Heart Cath and Coronary Angiography;  Surgeon:  Sherren Mocha, MD;  Location: [facility] CV LAB;  Service: Cardiovascular;  Laterality: N/A;  . CHOLECYSTECTOMY    . CORONARY ANGIOPLASTY WITH STENT PLACEMENT  04/2008 12/06/2012   "1 + 1" (12/06/2012)  . CORONARY STENT PLACEMENT  05/08/2013   DES TO LAD      DR MCALHANY  . IR IMAGING GUIDED PORT INSERTION  04/29/2018  . LEFT HEART CATHETERIZATION WITH CORONARY ANGIOGRAM N/A 12/06/2012   Procedure: LEFT HEART CATHETERIZATION WITH CORONARY ANGIOGRAM;  Surgeon: [name], MD;  Location: [facility] CATH LAB;  Service: Cardiovascular;  Laterality: N/A;  . LEFT HEART  CATHETERIZATION WITH CORONARY ANGIOGRAM N/A 05/08/2013   Procedure: LEFT HEART CATHETERIZATION WITH CORONARY ANGIOGRAM;  Surgeon: [name], MD;  Location: [facility] CATH LAB;  Service: Cardiovascular;  Laterality: N/A;  . LEFT HEART CATHETERIZATION WITH CORONARY ANGIOGRAM N/A 01/04/2014   Procedure: LEFT HEART CATHETERIZATION WITH CORONARY ANGIOGRAM;  Surgeon: [name], MD;  Location: [facility] CATH LAB;  Service: Cardiovascular;  Laterality: N/A;  . PERCUTANEOUS CORONARY STENT INTERVENTION (PCI-S)  12/06/2012   Procedure: PERCUTANEOUS CORONARY STENT INTERVENTION (PCI-S);  Surgeon: [name], MD;  Location[facility] CATH LAB;  Service: Cardiovascular;;  . PERCUTANEOUS CORONARY STENT INTERVENTION (PCI-S)  05/08/2013   Procedure: PERCUTANEOUS CORONARY STENT INTERVENTION (PCI-S);  Surgeon: [name], MD;  Location: [facility] CATH LAB;  Service: Cardiovascular;;  mid LAD   . TUBAL LIGATION    . VAGINAL HYSTERECTOMY    . VIDEO BRONCHOSCOPY WITH ENDOBRONCHIAL NAVIGATION N/A 02/17/2018   Procedure: VIDEO BRONCHOSCOPY WITH ENDOBRONCHIAL NAVIGATION;  Surgeon: Melrose Nakayama, MD;  Location: [facility];  Service: Thoracic;  Laterality: N/A;     OB History   No obstetric history on file.      Home Medications    Prior to Admission medications   Medication Sig Start Date End Date Taking? Authorizing Provider  albuterol (PROAIR HFA) 108 (90 Base) MCG/ACT inhaler Inhale 1 puff into the lungs every 6 (six) hours as needed (wheezing/shortness of breath.).     [provider]  albuterol (PROVENTIL) (5 MG/ML) 0.5% nebulizer solution Take 2.5 mg by nebulization every 6 (six) hours as needed for wheezing.    [provider]  amLODipine (NORVASC) 5 MG tablet Take 1.5 tablets (7.5 mg total) by mouth daily. Patient taking differently: Take 7.5 mg by mouth every evening.  01/10/18 04/10/18  [name], MD  aspirin 81 MG chewable tablet Chew 1 tablet (81 mg  total) by mouth daily. 12/07/12   Barrett, Evelene Croon, PA-C  BELSOMRA 20 MG TABS Take 20 mg by mouth at bedtime as needed for sleep. 04/29/18   [provider]  buPROPion (WELLBUTRIN SR) 150 MG 12 hr tablet Take 150-300 mg by mouth See admin instructions. Take 2 tablets (300 mg) by mouth daily in the morning & take 1 tablet (150 mg) by mouth in the evening.    [provider]  buPROPion (WELLBUTRIN XL) 150 MG 24 hr tablet Take 150 mg by mouth. 04/18/18   [provider]  clopidogrel (PLAVIX) 75 MG tablet Take 1 tablet (75 mg total) by mouth daily. MUST SCHEDULE APPOINTMENT FOR FURTHER REFILLS 09/04/16   [name], MD  DULoxetine (CYMBALTA) 60 MG capsule Take 120 mg by mouth daily. 04/28/18   [provider]  ergocalciferol (VITAMIN D2) 50000 UNITS capsule Take 50,000 Units by mouth every Wednesday.     [provider]  folic acid (FOLVITE) 1 MG tablet Take 1 tablet (  1 mg total) by mouth daily. 04/05/18   Curt Bears, MD  gabapentin (NEURONTIN) 100 MG capsule Take 1 capsule by mouth daily. 04/18/18   [provider]  guaifenesin (ROBITUSSIN) 100 MG/5ML syrup Take 100-200 mg by mouth 3 (three) times daily as needed for cough.    [provider]  HYDROcodone-acetaminophen (NORCO) 7.5-325 MG per tablet Take 1 tablet by mouth every 6 (six) hours as needed for pain.    [provider]  levothyroxine (SYNTHROID, LEVOTHROID) 25 MCG tablet Take 25 mcg by mouth daily before breakfast.    [provider]  lidocaine (LIDODERM) 5 % Place 1 patch onto the skin as needed.    [provider]  lidocaine-prilocaine (EMLA) cream Apply 1 application topically as needed. 04/19/18   Heilingoetter, Cassandra L, PA-C  lidocaine-prilocaine (EMLA) cream lidocaine-prilocaine 2.5 %-2.5 % topical cream  APP ONE APPLICATION TOPICALLY PRN    [provider]  LORazepam (ATIVAN) 2 MG tablet Take 2 mg by mouth See admin  instructions. Take 1 tablet (2 mg) by mouth scheduled at bedtime & take 1 tablet (2 mg) by mouth twice daily if needed for anxiety    [provider]  metoprolol tartrate (LOPRESSOR) 25 MG tablet Take 1 tablet (25 mg total) by mouth 2 (two) times daily. 02/11/18   [name], MD  montelukast (SINGULAIR) 10 MG tablet Take 10 mg by mouth at bedtime.     [provider]  nitroGLYCERIN (NITROSTAT) 0.4 MG SL tablet Place 1 tablet (0.4 mg total) under the tongue every 5 (five) minutes as needed for chest pain. Patient not taking: Reported on 03/03/2018 12/07/12   Barrett, Evelene Croon, PA-C  pantoprazole (PROTONIX) 40 MG tablet Take 1 tablet by mouth daily. 07/20/11   [provider]  prochlorperazine (COMPAZINE) 10 MG tablet TAKE 1 TABLET(10 MG) BY MOUTH EVERY 6 HOURS AS NEEDED FOR NAUSEA OR VOMITING Patient taking differently: Take 10 mg by mouth every 6 (six) hours as needed for nausea or vomiting.  05/09/18   Curt Bears, MD  SYMBICORT 160-4.5 MCG/ACT inhaler Take 2 puffs by mouth 2 (two) times daily. 12/08/17   [provider]  temazepam (RESTORIL) 15 MG capsule Take 1 capsule by mouth at bedtime.    [provider]  Vitamin D, Ergocalciferol, (DRISDOL) 1.25 MG (50000 UT) CAPS capsule Take 50,000 Units by mouth once a week.    [provider]    Family History Family History  Problem Relation Age of Onset  . CAD Neg Hx     Social History Social History   Tobacco Use  . Smoking status: Former Smoker    Packs/day: 0.50    Years: 30.00    Pack years: 15.00    Types: Cigarettes  . Smokeless tobacco: Never Used  . Tobacco comment: 12/06/2012 "quit smoking cigarettes ~ 8 yr ago"  Substance Use Topics  . Alcohol use: No  . Drug use: No     Allergies   Ciprofloxacin; Levofloxacin; Statins; Tricor [fenofibrate]; Latex; Codeine; and Metoprolol   Review of Systems Review of Systems  All other systems reviewed and are  negative.    Physical Exam Updated Vital Signs BP (!) 166/119 (BP Location: Left Arm)   Pulse (!) 109   Temp 99.5 F (37.5 C) (Oral)   Resp 18   Ht 5' 7"  (1.702 m)   Wt 72.6 kg   SpO2 99%   BMI 25.06 kg/m   Physical Exam Vitals signs and nursing  note reviewed.  Constitutional:      Appearance: She is well-developed.  HENT:     Head: Normocephalic and atraumatic.  Eyes:     Conjunctiva/sclera: Conjunctivae normal.     Pupils: Pupils are equal, round, and reactive to light.  Neck:     Musculoskeletal: Normal range of motion and neck supple.  Cardiovascular:     Rate and Rhythm: Normal rate and regular rhythm.     Heart sounds: No murmur. No friction rub. No gallop.   Pulmonary:     Effort: Pulmonary effort is normal. No respiratory distress.     Breath sounds: Normal breath sounds. No wheezing or rales.  Chest:     Chest wall: No tenderness.  Abdominal:     General: Bowel sounds are normal. There is no distension.     Palpations: Abdomen is soft. There is no mass.     Tenderness: There is abdominal tenderness. There is no guarding or rebound.  Musculoskeletal: Normal range of motion.        General: No tenderness.  Skin:    General: Skin is warm and dry.  Neurological:     Mental Status: She is alert and oriented to person, place, and time.  Psychiatric:        Behavior: Behavior normal.        Thought Content: Thought content normal.        Judgment: Judgment normal.      ED Treatments / Results  Labs (all labs ordered are listed, but only abnormal results are displayed) Labs Reviewed  COMPREHENSIVE METABOLIC PANEL - Abnormal; Notable for the following components:      Result Value   Chloride 97 (*)    Glucose, Bld 192 (*)    BUN 47 (*)    Creatinine, Ser 1.51 (*)    AST 63 (*)    ALT 144 (*)    Alkaline Phosphatase 167 (*)    GFR calc non Af Amer 36 (*)    GFR calc Af Amer 42 (*)    All other components within normal limits  LIPASE, BLOOD  CBC   URINALYSIS, ROUTINE W REFLEX MICROSCOPIC    EKG None  Radiology No results found.  Procedures Procedures (including critical care time)  Medications Ordered in ED Medications  sodium chloride flush (NS) 0.9 % injection 3 mL (has no administration in time range)  sodium chloride 0.9 % bolus 1,000 mL (has no administration in time range)  ondansetron (ZOFRAN) injection 4 mg (has no administration in time range)  fentaNYL (SUBLIMAZE) injection 50 mcg (has no administration in time range)  ondansetron (ZOFRAN-ODT) disintegrating tablet 4 mg (4 mg Oral Given 05/10/18 1933)     Initial Impression / Assessment and Plan / ED Course  I have reviewed the triage vital signs and the nursing notes.  Pertinent labs & imaging results that were available during my care of the patient were reviewed by me and considered in my medical decision making (see chart for details).        Nausea, vomiting, diarrhea for the past week.  He had chemotherapy approximately 1 week ago.  Has had symptoms like this in the past after chemotherapy.  States that she is not feeling any improved today.  She was seen last night in the ER for the same and offered admission, but declined at that time.  She reports worsening symptoms and also has severe right flank pain which she has not had before.  She does  not recall urinating today.  Will check bladder scan.  Check CT given significant right lower quadrant tenderness.  Will give fluids, treat pain, and nausea, and will reassess.  Patient still having persistent symptoms with new AKI, will consult hospitalist for admission.  Appreciate Dr. Marlowe Sax for bringing patient into the hospital. Final Clinical Impressions(s) / ED Diagnoses   Final diagnoses:  AKI (acute kidney injury) ([place])  Nausea vomiting and diarrhea    ED Discharge Orders    None       Montine Circle, PA-C 05/11/18 0425    Ward, Delice Bison, DO 05/11/18 [phone]

## 2018-05-10 NOTE — ED Notes (Addendum)
Bladder scan x2. 47m each time   Pt aware that urine sample is needed. Unable to provide at this time. Pt stated that she has not peed in the last 48 hours and has not had any urge to do so either. Will continue to monitor

## 2018-05-10 NOTE — ED Triage Notes (Signed)
Patient is complaining of nausea, vomiting, and diarrhea. Patient states that she is getting chemo. Patient was here last night but the medication that was given is not working.

## 2018-05-10 NOTE — ED Notes (Signed)
Unable to collect labs patient refused wants her port access nurse went in to talk to patient

## 2018-05-11 ENCOUNTER — Encounter (HOSPITAL_COMMUNITY): Payer: Self-pay

## 2018-05-11 ENCOUNTER — Emergency Department (HOSPITAL_COMMUNITY): Payer: Managed Care, Other (non HMO)

## 2018-05-11 DIAGNOSIS — D638 Anemia in other chronic diseases classified elsewhere: Secondary | ICD-10-CM | POA: Diagnosis present

## 2018-05-11 DIAGNOSIS — Z79890 Hormone replacement therapy (postmenopausal): Secondary | ICD-10-CM | POA: Diagnosis not present

## 2018-05-11 DIAGNOSIS — Z9049 Acquired absence of other specified parts of digestive tract: Secondary | ICD-10-CM | POA: Diagnosis not present

## 2018-05-11 DIAGNOSIS — C3492 Malignant neoplasm of unspecified part of left bronchus or lung: Secondary | ICD-10-CM

## 2018-05-11 DIAGNOSIS — Z85118 Personal history of other malignant neoplasm of bronchus and lung: Secondary | ICD-10-CM | POA: Diagnosis not present

## 2018-05-11 DIAGNOSIS — E785 Hyperlipidemia, unspecified: Secondary | ICD-10-CM | POA: Diagnosis present

## 2018-05-11 DIAGNOSIS — C7801 Secondary malignant neoplasm of right lung: Secondary | ICD-10-CM | POA: Diagnosis present

## 2018-05-11 DIAGNOSIS — I10 Essential (primary) hypertension: Secondary | ICD-10-CM

## 2018-05-11 DIAGNOSIS — R112 Nausea with vomiting, unspecified: Secondary | ICD-10-CM | POA: Diagnosis not present

## 2018-05-11 DIAGNOSIS — Z7982 Long term (current) use of aspirin: Secondary | ICD-10-CM | POA: Diagnosis not present

## 2018-05-11 DIAGNOSIS — R197 Diarrhea, unspecified: Secondary | ICD-10-CM | POA: Diagnosis not present

## 2018-05-11 DIAGNOSIS — N179 Acute kidney failure, unspecified: Secondary | ICD-10-CM | POA: Diagnosis present

## 2018-05-11 DIAGNOSIS — A084 Viral intestinal infection, unspecified: Secondary | ICD-10-CM | POA: Diagnosis present

## 2018-05-11 DIAGNOSIS — Z79899 Other long term (current) drug therapy: Secondary | ICD-10-CM | POA: Diagnosis not present

## 2018-05-11 DIAGNOSIS — E039 Hypothyroidism, unspecified: Secondary | ICD-10-CM

## 2018-05-11 DIAGNOSIS — Z955 Presence of coronary angioplasty implant and graft: Secondary | ICD-10-CM | POA: Diagnosis not present

## 2018-05-11 DIAGNOSIS — M797 Fibromyalgia: Secondary | ICD-10-CM | POA: Diagnosis present

## 2018-05-11 DIAGNOSIS — Z79891 Long term (current) use of opiate analgesic: Secondary | ICD-10-CM | POA: Diagnosis not present

## 2018-05-11 DIAGNOSIS — F329 Major depressive disorder, single episode, unspecified: Secondary | ICD-10-CM | POA: Diagnosis present

## 2018-05-11 DIAGNOSIS — I2510 Atherosclerotic heart disease of native coronary artery without angina pectoris: Secondary | ICD-10-CM | POA: Diagnosis present

## 2018-05-11 DIAGNOSIS — Z87891 Personal history of nicotine dependence: Secondary | ICD-10-CM | POA: Diagnosis not present

## 2018-05-11 DIAGNOSIS — J45909 Unspecified asthma, uncomplicated: Secondary | ICD-10-CM | POA: Diagnosis present

## 2018-05-11 DIAGNOSIS — C7802 Secondary malignant neoplasm of left lung: Secondary | ICD-10-CM | POA: Diagnosis present

## 2018-05-11 DIAGNOSIS — I471 Supraventricular tachycardia: Secondary | ICD-10-CM | POA: Diagnosis present

## 2018-05-11 DIAGNOSIS — K219 Gastro-esophageal reflux disease without esophagitis: Secondary | ICD-10-CM | POA: Diagnosis present

## 2018-05-11 DIAGNOSIS — Z90710 Acquired absence of both cervix and uterus: Secondary | ICD-10-CM | POA: Diagnosis not present

## 2018-05-11 LAB — URINALYSIS, ROUTINE W REFLEX MICROSCOPIC
Bilirubin Urine: NEGATIVE
Glucose, UA: NEGATIVE mg/dL
Ketones, ur: 20 mg/dL — AB
Nitrite: NEGATIVE
PH: 5 (ref 5.0–8.0)
Protein, ur: 30 mg/dL — AB

## 2018-05-11 LAB — C DIFFICILE QUICK SCREEN W PCR REFLEX
C DIFFICILE (CDIFF) TOXIN: NEGATIVE
C Diff antigen: POSITIVE — AB

## 2018-05-11 LAB — CREATININE, SERUM
CREATININE: 1.27 mg/dL — AB (ref 0.44–1.00)
GFR calc Af Amer: 52 mL/min — ABNORMAL LOW (ref >60)
GFR, EST NON AFRICAN AMERICAN: 45 mL/min — AB (ref >60)

## 2018-05-11 LAB — CBC
HCT: 32.2 % — ABNORMAL LOW (ref 36.0–46.0)
Hemoglobin: 10.9 g/dL — ABNORMAL LOW (ref 12.0–15.0)
MCH: 30.3 pg (ref 26.0–34.0)
MCHC: 33.9 g/dL (ref 30.0–36.0)
MCV: 89.4 fL (ref 80.0–100.0)
Platelets: 118 10*3/uL — ABNORMAL LOW (ref 150–400)
RBC: 3.6 MIL/uL — ABNORMAL LOW (ref 3.87–5.11)
RDW: 13.5 % (ref 11.5–15.5)
WBC: 6.3 10*3/uL (ref 4.0–10.5)
nRBC: 0 % (ref 0.0–0.2)

## 2018-05-11 MED ORDER — ENOXAPARIN SODIUM 40 MG/0.4ML ~~LOC~~ SOLN
40.0000 mg | SUBCUTANEOUS | Status: DC
  Administered 2018-05-11 – 2018-05-12 (×2): 40 mg via SUBCUTANEOUS
  Filled 2018-05-11 (×2): qty 0.4

## 2018-05-11 MED ORDER — METOPROLOL TARTRATE 25 MG PO TABS
25.0000 mg | ORAL_TABLET | Freq: Two times a day (BID) | ORAL | Status: DC
  Filled 2018-05-11: qty 1

## 2018-05-11 MED ORDER — LACTATED RINGERS IV SOLN
INTRAVENOUS | Status: DC
  Administered 2018-05-11 – 2018-05-13 (×5): via INTRAVENOUS

## 2018-05-11 MED ORDER — LEVOTHYROXINE SODIUM 100 MCG/5ML IV SOLN
12.5000 ug | Freq: Every day | INTRAVENOUS | Status: DC
  Filled 2018-05-11: qty 5

## 2018-05-11 MED ORDER — GABAPENTIN 100 MG PO CAPS
100.0000 mg | ORAL_CAPSULE | Freq: Every day | ORAL | Status: DC
  Administered 2018-05-12 – 2018-05-14 (×3): 100 mg via ORAL
  Filled 2018-05-11 (×4): qty 1

## 2018-05-11 MED ORDER — CLOPIDOGREL BISULFATE 75 MG PO TABS
75.0000 mg | ORAL_TABLET | Freq: Every day | ORAL | Status: DC
  Administered 2018-05-12: 75 mg via ORAL
  Filled 2018-05-11 (×3): qty 1

## 2018-05-11 MED ORDER — SODIUM CHLORIDE (PF) 0.9 % IJ SOLN
INTRAMUSCULAR | Status: AC
  Filled 2018-05-11: qty 50

## 2018-05-11 MED ORDER — ALBUTEROL SULFATE (2.5 MG/3ML) 0.083% IN NEBU
2.5000 mg | INHALATION_SOLUTION | Freq: Four times a day (QID) | RESPIRATORY_TRACT | Status: DC | PRN
  Administered 2018-05-12: 2.5 mg via RESPIRATORY_TRACT
  Filled 2018-05-11: qty 3

## 2018-05-11 MED ORDER — IOPAMIDOL (ISOVUE-300) INJECTION 61%
INTRAVENOUS | Status: AC
  Filled 2018-05-11: qty 100

## 2018-05-11 MED ORDER — FENTANYL CITRATE (PF) 100 MCG/2ML IJ SOLN: 25.0000 ug | INTRAMUSCULAR | Status: DC | PRN

## 2018-05-11 MED ORDER — IOPAMIDOL (ISOVUE-300) INJECTION 61%
100.0000 mL | Freq: Once | INTRAVENOUS | Status: AC | PRN
  Administered 2018-05-11: 70 mL via INTRAVENOUS

## 2018-05-11 MED ORDER — TEMAZEPAM 15 MG PO CAPS
15.0000 mg | ORAL_CAPSULE | Freq: Every day | ORAL | Status: DC
  Administered 2018-05-11 – 2018-05-13 (×3): 15 mg via ORAL
  Filled 2018-05-11 (×3): qty 1

## 2018-05-11 MED ORDER — LORAZEPAM 2 MG/ML IJ SOLN
1.0000 mg | Freq: Two times a day (BID) | INTRAMUSCULAR | Status: DC | PRN
  Administered 2018-05-12: 1 mg via INTRAVENOUS
  Filled 2018-05-11: qty 1

## 2018-05-11 MED ORDER — BUPROPION HCL ER (SR) 150 MG PO TB12
150.0000 mg | ORAL_TABLET | Freq: Every day | ORAL | Status: DC
  Administered 2018-05-11 – 2018-05-13 (×3): 150 mg via ORAL
  Filled 2018-05-11 (×3): qty 1

## 2018-05-11 MED ORDER — ONDANSETRON HCL 4 MG/2ML IJ SOLN
4.0000 mg | Freq: Once | INTRAMUSCULAR | Status: AC
  Administered 2018-05-11: 4 mg via INTRAVENOUS
  Filled 2018-05-11: qty 2

## 2018-05-11 MED ORDER — PROMETHAZINE HCL 25 MG PO TABS
12.5000 mg | ORAL_TABLET | Freq: Four times a day (QID) | ORAL | Status: DC | PRN
  Administered 2018-05-11: 12.5 mg via ORAL
  Filled 2018-05-11: qty 1

## 2018-05-11 MED ORDER — LOPERAMIDE HCL 2 MG PO CAPS: 2.0000 mg | ORAL_CAPSULE | ORAL | Status: DC | PRN

## 2018-05-11 MED ORDER — PROMETHAZINE HCL 25 MG/ML IJ SOLN
12.5000 mg | Freq: Four times a day (QID) | INTRAMUSCULAR | Status: DC | PRN
  Administered 2018-05-11 – 2018-05-14 (×5): 12.5 mg via INTRAVENOUS
  Filled 2018-05-11 (×7): qty 1

## 2018-05-11 MED ORDER — METOPROLOL TARTRATE 5 MG/5ML IV SOLN
2.5000 mg | Freq: Four times a day (QID) | INTRAVENOUS | Status: DC
  Administered 2018-05-11 – 2018-05-12 (×4): 2.5 mg via INTRAVENOUS
  Filled 2018-05-11 (×3): qty 5

## 2018-05-11 MED ORDER — VITAMIN D (ERGOCALCIFEROL) 1.25 MG (50000 UNIT) PO CAPS
50000.0000 [IU] | ORAL_CAPSULE | ORAL | Status: DC
  Filled 2018-05-11: qty 1

## 2018-05-11 MED ORDER — MONTELUKAST SODIUM 10 MG PO TABS
10.0000 mg | ORAL_TABLET | Freq: Every day | ORAL | Status: DC
  Administered 2018-05-11 – 2018-05-13 (×3): 10 mg via ORAL
  Filled 2018-05-11 (×3): qty 1

## 2018-05-11 MED ORDER — ZOLPIDEM TARTRATE 5 MG PO TABS
5.0000 mg | ORAL_TABLET | Freq: Every evening | ORAL | Status: DC | PRN
  Administered 2018-05-13: 5 mg via ORAL
  Filled 2018-05-11: qty 1

## 2018-05-11 MED ORDER — ACETAMINOPHEN 325 MG PO TABS: 650.0000 mg | ORAL_TABLET | Freq: Four times a day (QID) | ORAL | Status: DC | PRN

## 2018-05-11 MED ORDER — BUPROPION HCL ER (SR) 150 MG PO TB12
300.0000 mg | ORAL_TABLET | Freq: Every day | ORAL | Status: DC
  Administered 2018-05-12 – 2018-05-14 (×3): 300 mg via ORAL
  Filled 2018-05-11 (×4): qty 2

## 2018-05-11 MED ORDER — BUPROPION HCL ER (SR) 150 MG PO TB12: 150.0000 mg | ORAL_TABLET | ORAL | Status: DC

## 2018-05-11 MED ORDER — SUVOREXANT 20 MG PO TABS: 20.0000 mg | ORAL_TABLET | Freq: Every evening | ORAL | Status: DC | PRN

## 2018-05-11 MED ORDER — AMLODIPINE BESYLATE 5 MG PO TABS
5.0000 mg | ORAL_TABLET | Freq: Every day | ORAL | Status: DC
  Administered 2018-05-12 – 2018-05-14 (×3): 5 mg via ORAL
  Filled 2018-05-11 (×4): qty 1

## 2018-05-11 MED ORDER — HYDROCODONE-ACETAMINOPHEN 7.5-325 MG PO TABS
1.0000 | ORAL_TABLET | Freq: Four times a day (QID) | ORAL | Status: DC | PRN
  Administered 2018-05-11 – 2018-05-14 (×8): 1 via ORAL
  Filled 2018-05-11 (×8): qty 1

## 2018-05-11 MED ORDER — DULOXETINE HCL 30 MG PO CPEP
120.0000 mg | ORAL_CAPSULE | Freq: Every day | ORAL | Status: DC
  Administered 2018-05-12 – 2018-05-14 (×3): 120 mg via ORAL
  Filled 2018-05-11 (×4): qty 4

## 2018-05-11 MED ORDER — FOLIC ACID 1 MG PO TABS
1.0000 mg | ORAL_TABLET | Freq: Every day | ORAL | Status: DC
  Administered 2018-05-12 – 2018-05-14 (×3): 1 mg via ORAL
  Filled 2018-05-11 (×4): qty 1

## 2018-05-11 MED ORDER — LEVOTHYROXINE SODIUM 25 MCG PO TABS: 25.0000 ug | ORAL_TABLET | Freq: Every day | ORAL | Status: DC

## 2018-05-11 MED ORDER — ACETAMINOPHEN 650 MG RE SUPP: 650.0000 mg | Freq: Four times a day (QID) | RECTAL | Status: DC | PRN

## 2018-05-11 MED ORDER — ASPIRIN 81 MG PO CHEW
81.0000 mg | CHEWABLE_TABLET | Freq: Every day | ORAL | Status: DC
  Administered 2018-05-12 – 2018-05-14 (×3): 81 mg via ORAL
  Filled 2018-05-11 (×4): qty 1

## 2018-05-11 MED ORDER — PANTOPRAZOLE SODIUM 40 MG PO TBEC
40.0000 mg | DELAYED_RELEASE_TABLET | Freq: Every day | ORAL | Status: DC
  Administered 2018-05-12 – 2018-05-13 (×3): 40 mg via ORAL
  Filled 2018-05-11 (×3): qty 1

## 2018-05-11 MED ORDER — MOMETASONE FURO-FORMOTEROL FUM 200-5 MCG/ACT IN AERO
2.0000 | INHALATION_SPRAY | Freq: Two times a day (BID) | RESPIRATORY_TRACT | Status: DC
  Administered 2018-05-11 – 2018-05-14 (×5): 2 via RESPIRATORY_TRACT
  Filled 2018-05-11: qty 8.8

## 2018-05-11 MED ORDER — SODIUM CHLORIDE 0.9 % IV BOLUS
1000.0000 mL | Freq: Once | INTRAVENOUS | Status: AC
  Administered 2018-05-11: 1000 mL via INTRAVENOUS

## 2018-05-11 MED ORDER — NITROGLYCERIN 0.4 MG SL SUBL: 0.4000 mg | SUBLINGUAL_TABLET | SUBLINGUAL | Status: DC | PRN

## 2018-05-11 NOTE — H&P (Signed)
History and Physical    DOA: 05/10/2018  PCP: [name], NP  Patient coming from: Home  Chief Complaint: Nausea vomiting and diarrhea for 1 week  HPI: [name] is a 63 y.o. female with history h/o CAD, depression, fibromyalgia, GERD, asthma, SVT, recently diagnosed stage IV lung cancer (PET scan per patient showed bilateral lung mets) who follows Dr. Julien Nordmann for oncology and been receiving chemotherapy for the last 6 weeks presents with complaints of nausea vomiting and diarrhea.  Patient states her last chemotherapy session was 2 weeks back.  She usually has some dyspepsia with mild diarrhea after chemotherapy.  This time it has been more severe with loose watery stools almost every hour with nausea and several vomiting episodes.  She denies hematemesis, melena or hematochezia.  She reports feeling feverish and in the ED T-max has been 99.5.  She also reports abdominal pain, stabbing 9/10 in a bandlike distribution in the mid abdomen radiating to the back, worse while having diarrhea.  Work-up in the ED revealed normal white count, AKI with BUN 47 and creatinine at 1.5, CT abdomen with no acute infectious process identified.  Patient has had several episodes of loose watery stool soiling the bed and floor in the ED.   Review of Systems: As per HPI otherwise 10 point review of systems negative.    Past Medical History:  Diagnosis Date   Anemia    "long time ago" (12/06/2012)   Arthritis    "right knee real bad; in my hands bad" (12/06/2012)   Asthma    Coronary artery disease    a. s/p Xience DES to [facility] 04/2008;  b. LHC 05/2008: Proximal RCA 25%, mid RCA stent patent.  c. Anormal nuc 2014 -> s/p LHC with severe mRCA stenosis s/p DES. d. Cath 04/2013 s/p DES to LAD.  e. LHC 08/2015 was stable.   Depression    "years ago" (12/06/2012)   Fibromyalgia    GERD (gastroesophageal reflux disease)    H/O hiatal hernia    Hepatitis    "not A, B, or C" (12/06/2012)    Hyperlipidemia    intol to statins and other chol agents due to elevated LFTs   Hypertension    Lung nodules    Bilateral   Migraines    "when I was younger" (12/06/2012)   PONV (postoperative nausea and vomiting)    "Very bad"   Premature atrial contractions    PVC's (premature ventricular contractions)    Sinus headache    SVT (supraventricular tachycardia) ([place])     Past Surgical History:  Procedure Laterality Date   CARDIAC CATHETERIZATION  04/2008; 05/2008   CARDIAC CATHETERIZATION N/A 08/07/2015   Procedure: Left Heart Cath and Coronary Angiography;  Surgeon: Sherren Mocha, MD;  Location: [facility];  Service: Cardiovascular;  Laterality: N/A;   CHOLECYSTECTOMY     CORONARY ANGIOPLASTY WITH STENT PLACEMENT  04/2008 12/06/2012   "1 + 1" (12/06/2012)   CORONARY STENT PLACEMENT  05/08/2013   DES TO LAD      DR MCALHANY   IR IMAGING GUIDED PORT INSERTION  04/29/2018   LEFT HEART CATHETERIZATION WITH CORONARY ANGIOGRAM N/A 12/06/2012   Procedure: LEFT HEART CATHETERIZATION WITH CORONARY ANGIOGRAM;  Surgeon: [name], MD;  Location: [facility] CATH LAB;  Service: Cardiovascular;  Laterality: N/A;   LEFT HEART CATHETERIZATION WITH CORONARY ANGIOGRAM N/A 05/08/2013   Procedure: LEFT HEART CATHETERIZATION WITH CORONARY ANGIOGRAM;  Surgeon: [name], MD;  Location: [facility] CATH LAB;  Service: Cardiovascular;  Laterality: N/A;   LEFT HEART CATHETERIZATION WITH CORONARY ANGIOGRAM N/A 01/04/2014   Procedure: LEFT HEART CATHETERIZATION WITH CORONARY ANGIOGRAM;  Surgeon: [name], MD;  Location: [facility] CATH LAB;  Service: Cardiovascular;  Laterality: N/A;   PERCUTANEOUS CORONARY STENT INTERVENTION (PCI-S)  12/06/2012   Procedure: PERCUTANEOUS CORONARY STENT INTERVENTION (PCI-S);  Surgeon: [name], MD;  Location[facility] CATH LAB;  Service: Cardiovascular;;   PERCUTANEOUS CORONARY STENT INTERVENTION (PCI-S)  05/08/2013   Procedure: PERCUTANEOUS  CORONARY STENT INTERVENTION (PCI-S);  Surgeon: [name], MD;  Location: [facility] CATH LAB;  Service: Cardiovascular;;  mid LAD    TUBAL LIGATION     VAGINAL HYSTERECTOMY     VIDEO BRONCHOSCOPY WITH ENDOBRONCHIAL NAVIGATION N/A 02/17/2018   Procedure: VIDEO BRONCHOSCOPY WITH ENDOBRONCHIAL NAVIGATION;  Surgeon: Melrose Nakayama, MD;  Location: [place];  Service: Thoracic;  Laterality: N/A;    Social history:  reports that she has quit smoking. Her smoking use included cigarettes. She has a 15.00 pack-year smoking history. She has never used smokeless tobacco. She reports that she does not drink alcohol or use drugs.   Allergies  Allergen Reactions   Ciprofloxacin Hives   Levofloxacin     UNSPECIFIED REACTION, BUT LIKELY HIVES > HIVES WITH CIPRO   Statins Other (See Comments)    Liver enzymes Liver enzymes   Tricor [Fenofibrate] Hives and Itching   Latex Other (See Comments)    Patient states "Messes with my skin."   Codeine Nausea And Vomiting   Metoprolol Other (See Comments)    Does not tolerate beta blockers 10/02/2013-pt states she can tolerate 25 mg and lower     Family History  Problem Relation Age of Onset   CAD Neg Hx       Prior to Admission medications   Medication Sig Start Date End Date Taking? Authorizing Provider  albuterol (PROAIR HFA) 108 (90 Base) MCG/ACT inhaler Inhale 1 puff into the lungs every 6 (six) hours as needed (wheezing/shortness of breath.).    Yes [provider]  albuterol (PROVENTIL) (5 MG/ML) 0.5% nebulizer solution Take 2.5 mg by nebulization every 6 (six) hours as needed for wheezing.   Yes [provider]  amLODipine (NORVASC) 5 MG tablet Take 1.5 tablets (7.5 mg total) by mouth daily. Patient taking differently: Take 7.5 mg by mouth every evening.  01/10/18 05/11/18 Yes [name], MD  aspirin 81 MG chewable tablet Chew 1 tablet (81 mg total) by mouth daily. 12/07/12  Yes Barrett, Rhonda G,  PA-C  BELSOMRA 20 MG TABS Take 20 mg by mouth at bedtime as needed for sleep. 04/29/18  Yes [provider]  buPROPion (WELLBUTRIN SR) 150 MG 12 hr tablet Take 150-300 mg by mouth See admin instructions. Take 2 tablets (300 mg) by mouth daily in the morning & take 1 tablet (150 mg) by mouth in the evening.   Yes [provider]  clopidogrel (PLAVIX) 75 MG tablet Take 1 tablet (75 mg total) by mouth daily. MUST SCHEDULE APPOINTMENT FOR FURTHER REFILLS 09/04/16  Yes [name], MD  DULoxetine (CYMBALTA) 60 MG capsule Take 120 mg by mouth daily. 04/28/18  Yes [provider]  ergocalciferol (VITAMIN D2) 50000 UNITS capsule Take 50,000 Units by mouth every Wednesday.    Yes [provider]  folic acid (FOLVITE) 1 MG tablet Take 1 tablet (1 mg total) by mouth daily. 04/05/18  Yes Curt Bears, MD  gabapentin (NEURONTIN) 100 MG capsule Take 100  mg by mouth daily.  04/18/18  Yes [provider]  HYDROcodone-acetaminophen (NORCO) 7.5-325 MG per tablet Take 1 tablet by mouth every 6 (six) hours as needed for pain.   Yes [provider]  levothyroxine (SYNTHROID, LEVOTHROID) 25 MCG tablet Take 25 mcg by mouth daily before breakfast.   Yes [provider]  LORazepam (ATIVAN) 2 MG tablet Take 2 mg by mouth See admin instructions. Take 1 tablet (2 mg) by mouth scheduled at bedtime & take 1 tablet (2 mg) by mouth twice daily if needed for anxiety   Yes [provider]  metoprolol tartrate (LOPRESSOR) 25 MG tablet Take 1 tablet (25 mg total) by mouth 2 (two) times daily. 02/11/18  Yes [name], MD  montelukast (SINGULAIR) 10 MG tablet Take 10 mg by mouth at bedtime.    Yes [provider]  pantoprazole (PROTONIX) 40 MG tablet Take 40 mg by mouth daily.  07/20/11  Yes [provider]  prochlorperazine (COMPAZINE) 10 MG tablet TAKE 1 TABLET(10 MG) BY MOUTH EVERY 6 HOURS AS NEEDED FOR NAUSEA OR  VOMITING Patient taking differently: Take 10 mg by mouth every 6 (six) hours as needed for nausea or vomiting.  05/09/18  Yes Curt Bears, MD  SYMBICORT 160-4.5 MCG/ACT inhaler Take 2 puffs by mouth 2 (two) times daily. 12/08/17  Yes [provider]  temazepam (RESTORIL) 15 MG capsule Take 15 mg by mouth at bedtime.    Yes [provider]  lidocaine-prilocaine (EMLA) cream Apply 1 application topically as needed. 04/19/18   Heilingoetter, Cassandra L, PA-C  nitroGLYCERIN (NITROSTAT) 0.4 MG SL tablet Place 1 tablet (0.4 mg total) under the tongue every 5 (five) minutes as needed for chest pain. 12/07/12   Lonn Georgia, PA-C    Physical Exam: Vitals:   05/11/18 0330 05/11/18 0651 05/11/18 0700 05/11/18 0736  BP: (!) 151/87 (!) 157/104 (!) 156/94 (!) 165/69  Pulse: 90 84  82  Resp: 17 18  18   Temp:      TempSrc:      SpO2: 100% 98%  100%  Weight:      Height:        Constitutional: Patient ambulating in the room without dizziness but in mild discomfort due to recurrent diarrhea and abdominal cramping Eyes: PERRL, lids and conjunctivae normal ENMT: Mucous membranes are moist. Posterior pharynx clear of any exudate or lesions.Normal dentition.  Neck: normal, supple, no masses, no thyromegaly Respiratory: Decreased breath sounds bilaterally but clear to auscultation, no wheezing, no crackles. Normal respiratory effort. No accessory muscle use.  Cardiovascular: Regular rate and rhythm, no murmurs / rubs / gallops. No extremity edema. 2+ pedal pulses. No carotid bruits.  Abdomen: no tenderness, no masses palpated. No hepatosplenomegaly. Bowel sounds positive.  Musculoskeletal: no clubbing / cyanosis. No joint deformity upper and lower extremities. Good ROM, no contractures. Normal muscle tone.  Neurologic: CN 2-12 grossly intact. Sensation intact, DTR normal. Strength 5/5 in all 4.  Psychiatric: Normal judgment and insight. Alert and oriented x 3. Normal mood.   SKIN/catheters: no rashes, lesions, ulcers. No induration  Labs on Admission: I have personally reviewed following labs and imaging studies  CBC: Recent Labs  Lab 05/09/18 1418 05/10/18 1946  WBC 7.1 6.1  HGB 13.0 13.5  HCT 39.9 40.8  MCV 88.5 87.9  PLT 237 656   Basic Metabolic Panel: Recent Labs  Lab 05/09/18 1418 05/10/18 1946  NA 135 135  K 4.4 4.2  CL 100 97*  CO2 21* 24  GLUCOSE 138* 192*  BUN 27* 47*  CREATININE 0.96 1.51*  CALCIUM 9.5 9.7   GFR: Estimated Creatinine Clearance: 37.1 mL/min (A) (by C-G formula based on SCr of 1.51 mg/dL (H)). Liver Function Tests: Recent Labs  Lab 05/09/18 1418 05/10/18 1946  AST 62* 63*  ALT 159* 144*  ALKPHOS 190* 167*  BILITOT 1.1 0.8  PROT 7.6 8.1  ALBUMIN 4.3 4.7   Recent Labs  Lab 05/09/18 1418 05/10/18 1946  LIPASE 37 37   No results for input(s): AMMONIA in the last 168 hours. Coagulation Profile: No results for input(s): INR, PROTIME in the last 168 hours. Cardiac Enzymes: No results for input(s): CKTOTAL, CKMB, CKMBINDEX, TROPONINI in the last 168 hours. BNP (last 3 results) Recent Labs    01/10/18 1630  PROBNP 69   HbA1C: No results for input(s): HGBA1C in the last 72 hours. CBG: No results for input(s): GLUCAP in the last 168 hours. Lipid Profile: No results for input(s): CHOL, HDL, LDLCALC, TRIG, CHOLHDL, LDLDIRECT in the last 72 hours. Thyroid Function Tests: No results for input(s): TSH, T4TOTAL, FREET4, T3FREE, THYROIDAB in the last 72 hours. Anemia Panel: No results for input(s): VITAMINB12, FOLATE, FERRITIN, TIBC, IRON, RETICCTPCT in the last 72 hours. Urine analysis:    Component Value Date/Time   COLORURINE YELLOW 05/10/2018 1946   APPEARANCEUR HAZY (A) 05/10/2018 1946   LABSPEC >1.046 (H) 05/10/2018 1946   PHURINE 5.0 05/10/2018 1946   GLUCOSEU NEGATIVE 05/10/2018 1946   HGBUR MODERATE (A) 05/10/2018 1946   BILIRUBINUR NEGATIVE 05/10/2018 1946   KETONESUR 20 (A) 05/10/2018  1946   PROTEINUR 30 (A) 05/10/2018 1946   UROBILINOGEN 0.2 02/06/2009 2322   NITRITE NEGATIVE 05/10/2018 1946   LEUKOCYTESUR MODERATE (A) 05/10/2018 1946    Radiological Exams on Admission: Ct Abdomen Pelvis W Contrast  Result Date: 05/11/2018 CLINICAL DATA:  Nausea, vomiting, diarrhea. Ongoing chemotherapy for lung cancer. MEDICATIONS not seen. EXAM: CT ABDOMEN AND PELVIS WITH CONTRAST TECHNIQUE: Multidetector CT imaging of the abdomen and pelvis was performed using the standard protocol following bolus administration of intravenous contrast. CONTRAST:  64m ISOVUE-300 IOPAMIDOL (ISOVUE-300) INJECTION 61% COMPARISON:  CT abdomen and pelvis 12/08/2012. PET-CT 02/03/2018 FINDINGS: Lower chest: Lung bases are clear. Hepatobiliary: No focal liver abnormality is seen. Status post cholecystectomy. No biliary dilatation. Pancreas: Unremarkable. No pancreatic ductal dilatation or surrounding inflammatory changes. Spleen: Normal in size without focal abnormality. Adrenals/Urinary Tract: Adrenal glands are unremarkable. Kidneys are normal, without renal calculi, focal lesion, or hydronephrosis. Bladder is unremarkable. Stomach/Bowel: Stomach, small bowel, and colon are not abnormally distended. No wall thickening or inflammatory changes appreciated. Appendix is normal. Vascular/Lymphatic: Aortic atherosclerosis. No enlarged abdominal or pelvic lymph nodes. Reproductive: Status post hysterectomy. No adnexal masses. Linear metallic density or suture in the low pelvis, unchanged since 2014 study. Other: No free air or free fluid in the abdomen. Abdominal wall musculature appears intact. Musculoskeletal: No acute or significant osseous findings. IMPRESSION: No acute process demonstrated in the abdomen or pelvis. No evidence of bowel obstruction or inflammation. Aortic Atherosclerosis (ICD10-I70.0). Electronically Signed   By: WLucienne CapersM.D.   On: 05/11/2018 00:49    EKG: Independently reviewed.  Sinus  tachycardia with QTC of 437 ms     Assessment and Plan:   1.  Acute severe gastroenteritis: Viral versus bacterial versus chemotherapy related.  Check stool bio fire and stool for C. difficile.  Supportive management with antiemetics.  Will initiate antidiarrheals if C. difficile negative.  N.p.o.  for now with IV fluids.  Flexi-Seal for severe diarrhea.  T-max 99.5 and no evidence of leukocytosis but patient on chemotherapy.  Patient is status post cholecystectomy.  CT abdomen unremarkable for colitis or other intra-abdominal abnormalities.  2.  AKI: Present on admission: Secondary to problem #1.  N.p.o. with IV fluids for now.  Repeat labs in a.m.  3.  Stage IV lung cancer: Follow-up primary oncologist upon discharge.  Nebulizer treatments PRN  4.  Depression/fibromyalgia: Resume home medications  5.  SVT: Resume beta-blockers  6.  Hypertension: Resume Norvasc at a lower dose given problem #1.  Resume beta-blockers  7.  CAD: Resume aspirin, Plavix and beta-blockers   8.  Hypothyroidism: Synthroid  DVT prophylaxis: Lovenox  Code Status: Full code as confirmed by patient  Family Communication: Discussed with patient. Health care proxy would be her husband Consults called: None at this time Admission status: I certify that at the point of admission it is my clinical judgment that the patient will require inpatient hospital care spanning beyond 2 midnights from the point of admission due to high intensity of service and high frequency of surveillance required.Inpatient status is judged to be reasonable and necessary in order to provide the required intensity of service to ensure the patient's safety. The patient's presenting symptoms, physical exam findings, and initial radiographic and laboratory data in the context of their chronic comorbidities is felt to place them at high risk for further clinical deterioration. The following factors support the patient status of inpatient. AKI/severe  diarrhea/vomiting requiring NPO status and IV fluids.   Guilford Shi MD [facility] Pager [phone]  If 7PM-7AM, please contact night-coverage [email or web address] Password [facility]  05/11/2018, 8:23 AM

## 2018-05-11 NOTE — ED Notes (Signed)
Patient transported to CT 

## 2018-05-11 NOTE — ED Notes (Signed)
Pt's "daughter" is leaving and pt requesting numbers be put in chart  Husband- Dannika Hilgeman [phone] Daughter- Hart Robinsons [phone] Alise Calais [phone] Daughter- Herschell Dimes [phone]

## 2018-05-11 NOTE — ED Notes (Signed)
ED TO INPATIENT HANDOFF REPORT  ED Nurse Name and Phone #: Luetta Nutting [phone]  S Name/Age/Gender [name] 63 y.o. female Room/Bed: WA19/WA19  Code Status   Code Status: Full Code  Home/SNF/Other Home Patient oriented to: self Is this baseline? Yes   Triage Complete: Triage complete  Chief Complaint weakness   Triage Note Patient is complaining of nausea, vomiting, and diarrhea. Patient states that she is getting chemo. Patient was here last night but the medication that was given is not working.   Allergies Allergies  Allergen Reactions  . Ciprofloxacin Hives  . Levofloxacin     UNSPECIFIED REACTION, BUT LIKELY HIVES > HIVES WITH CIPRO  . Statins Other (See Comments)    Liver enzymes Liver enzymes  . Tricor [Fenofibrate] Hives and Itching  . Latex Other (See Comments)    Patient states "Messes with my skin."  . Codeine Nausea And Vomiting  . Metoprolol Other (See Comments)    Does not tolerate beta blockers 10/02/2013-pt states she can tolerate 25 mg and lower     Level of Care/Admitting Diagnosis ED Disposition    ED Disposition Condition [facility] Area: [place] [[id number]]  Level of Care: Med-Surg [16]  Diagnosis: AKI (acute kidney injury) [facility]) [[id number]]  Admitting Physician: Guilford Shi [[id number]]  Attending Physician: Guilford Shi [[id number]]  Estimated length of stay: past midnight tomorrow  Certification:: I certify this patient will need inpatient services for at least 2 midnights  PT Class (Do Not Modify): Inpatient [101]  PT Acc Code (Do Not Modify): Private [1]       B Medical/Surgery History Past Medical History:  Diagnosis Date  . Anemia    "long time ago" (12/06/2012)  . Arthritis    "right knee real bad; in my hands bad" (12/06/2012)  . Asthma   . Coronary artery disease    a. s/p Xience DES to [facility] 04/2008;  b. LHC 05/2008: Proximal RCA 25%, mid RCA stent patent.  c. Anormal nuc 2014 -> s/p  LHC with severe mRCA stenosis s/p DES. d. Cath 04/2013 s/p DES to LAD.  e. LHC 08/2015 was stable.  . Depression    "years ago" (12/06/2012)  . Fibromyalgia   . GERD (gastroesophageal reflux disease)   . H/O hiatal hernia   . Hepatitis    "not A, B, or C" (12/06/2012)  . Hyperlipidemia    intol to statins and other chol agents due to elevated LFTs  . Hypertension   . Lung nodules    Bilateral  . Migraines    "when I was younger" (12/06/2012)  . PONV (postoperative nausea and vomiting)    "Very bad"  . Premature atrial contractions   . PVC's (premature ventricular contractions)   . Sinus headache   . SVT (supraventricular tachycardia) (HCC)    Past Surgical History:  Procedure Laterality Date  . CARDIAC CATHETERIZATION  04/2008; 05/2008  . CARDIAC CATHETERIZATION N/A 08/07/2015   Procedure: Left Heart Cath and Coronary Angiography;  Surgeon: Sherren Mocha, MD;  Location: [facility] CV LAB;  Service: Cardiovascular;  Laterality: N/A;  . CHOLECYSTECTOMY    . CORONARY ANGIOPLASTY WITH STENT PLACEMENT  04/2008 12/06/2012   "1 + 1" (12/06/2012)  . CORONARY STENT PLACEMENT  05/08/2013   DES TO LAD      DR MCALHANY  . IR IMAGING GUIDED PORT INSERTION  04/29/2018  . LEFT HEART CATHETERIZATION WITH CORONARY ANGIOGRAM N/A 12/06/2012   Procedure: LEFT HEART CATHETERIZATION WITH  CORONARY ANGIOGRAM;  Surgeon: [name], MD;  Location: [facility] CATH LAB;  Service: Cardiovascular;  Laterality: N/A;  . LEFT HEART CATHETERIZATION WITH CORONARY ANGIOGRAM N/A 05/08/2013   Procedure: LEFT HEART CATHETERIZATION WITH CORONARY ANGIOGRAM;  Surgeon: [name], MD;  Location: [facility] CATH LAB;  Service: Cardiovascular;  Laterality: N/A;  . LEFT HEART CATHETERIZATION WITH CORONARY ANGIOGRAM N/A 01/04/2014   Procedure: LEFT HEART CATHETERIZATION WITH CORONARY ANGIOGRAM;  Surgeon: [name], MD;  Location: [facility] CATH LAB;  Service: Cardiovascular;  Laterality: N/A;  . PERCUTANEOUS CORONARY STENT  INTERVENTION (PCI-S)  12/06/2012   Procedure: PERCUTANEOUS CORONARY STENT INTERVENTION (PCI-S);  Surgeon: [name], MD;  Location: [facility] CATH LAB;  Service: Cardiovascular;;  . PERCUTANEOUS CORONARY STENT INTERVENTION (PCI-S)  05/08/2013   Procedure: PERCUTANEOUS CORONARY STENT INTERVENTION (PCI-S);  Surgeon: [name], MD;  Location: [facility] CATH LAB;  Service: Cardiovascular;;  mid LAD   . TUBAL LIGATION    . VAGINAL HYSTERECTOMY    . VIDEO BRONCHOSCOPY WITH ENDOBRONCHIAL NAVIGATION N/A 02/17/2018   Procedure: VIDEO BRONCHOSCOPY WITH ENDOBRONCHIAL NAVIGATION;  Surgeon: Melrose Nakayama, MD;  Location: [place];  Service: Thoracic;  Laterality: N/A;     A IV Location/Drains/Wounds Patient Lines/Drains/Airways Status   Active Line/Drains/Airways    Name:   Placement date:   Placement time:   Site:   Days:   Implanted Port 04/29/18 Right Chest   04/29/18    1356    Chest   12   Peripheral IV 05/10/18 Left Antecubital   05/10/18    2336    Antecubital   1          Intake/Output Last 24 hours No intake or output data in the 24 hours ending 05/11/18 0827  Labs/Imaging Results for orders placed or performed during the hospital encounter of 05/10/18 (from the past 48 hour(s))  Lipase, blood     Status: None   Collection Time: 05/10/18  7:46 PM  Result Value Ref Range   Lipase 37 11 - 51 U/L    Comment: Performed at [facility], [place] [address] [place], [place] [address]  Comprehensive metabolic panel     Status: Abnormal   Collection Time: 05/10/18  7:46 PM  Result Value Ref Range   Sodium 135 135 - 145 mmol/L   Potassium 4.2 3.5 - 5.1 mmol/L   Chloride 97 (L) 98 - 111 mmol/L   CO2 24 22 - 32 mmol/L   Glucose, Bld 192 (H) 70 - 99 mg/dL   BUN 47 (H) 8 - 23 mg/dL   Creatinine, Ser 1.51 (H) 0.44 - 1.00 mg/dL   Calcium 9.7 8.9 - 10.3 mg/dL   Total Protein 8.1 6.5 - 8.1 g/dL   Albumin 4.7 3.5 - 5.0 g/dL   AST 63 (H) 15 - 41 U/L   ALT 144 (H) 0  - 44 U/L   Alkaline Phosphatase 167 (H) 38 - 126 U/L   Total Bilirubin 0.8 0.3 - 1.2 mg/dL   GFR calc non Af Amer 36 (L) >60 mL/min   GFR calc Af Amer 42 (L) >60 mL/min   Anion gap 14 5 - 15    Comment: Performed at [facility], [place] [address] [place], [place] [address]  CBC     Status: None   Collection Time: 05/10/18  7:46 PM  Result Value Ref Range   WBC 6.1 4.0 - 10.5 K/uL   RBC 4.64 3.87 - 5.11 MIL/uL  Hemoglobin 13.5 12.0 - 15.0 g/dL   HCT 40.8 36.0 - 46.0 %   MCV 87.9 80.0 - 100.0 fL   MCH 29.1 26.0 - 34.0 pg   MCHC 33.1 30.0 - 36.0 g/dL   RDW 13.3 11.5 - 15.5 %   Platelets 200 150 - 400 K/uL   nRBC 0.0 0.0 - 0.2 %    Comment: Performed at [facility], [place] [address], [place], [place] [address]  Urinalysis, Routine w reflex microscopic     Status: Abnormal   Collection Time: 05/10/18  7:46 PM  Result Value Ref Range   Color, Urine YELLOW YELLOW   APPearance HAZY (A) CLEAR   Specific Gravity, Urine >1.046 (H) 1.005 - 1.030   pH 5.0 5.0 - 8.0   Glucose, UA NEGATIVE NEGATIVE mg/dL   Hgb urine dipstick MODERATE (A) NEGATIVE   Bilirubin Urine NEGATIVE NEGATIVE   Ketones, ur 20 (A) NEGATIVE mg/dL   Protein, ur 30 (A) NEGATIVE mg/dL   Nitrite NEGATIVE NEGATIVE   Leukocytes,Ua MODERATE (A) NEGATIVE   RBC / HPF 21-50 0 - 5 RBC/hpf   WBC, UA 6-10 0 - 5 WBC/hpf   Bacteria, UA RARE (A) NONE SEEN   Squamous Epithelial / LPF 0-5 0 - 5   Mucus PRESENT     Comment: Performed at [facility], [place] [address] [place], [place] [address]   Ct Abdomen Pelvis W Contrast  Result Date: 05/11/2018 CLINICAL DATA:  Nausea, vomiting, diarrhea. Ongoing chemotherapy for lung cancer. MEDICATIONS not seen. EXAM: CT ABDOMEN AND PELVIS WITH CONTRAST TECHNIQUE: Multidetector CT imaging of the abdomen and pelvis was performed using the standard protocol following bolus administration of intravenous contrast. CONTRAST:  42m ISOVUE-300  IOPAMIDOL (ISOVUE-300) INJECTION 61% COMPARISON:  CT abdomen and pelvis 12/08/2012. PET-CT 02/03/2018 FINDINGS: Lower chest: Lung bases are clear. Hepatobiliary: No focal liver abnormality is seen. Status post cholecystectomy. No biliary dilatation. Pancreas: Unremarkable. No pancreatic ductal dilatation or surrounding inflammatory changes. Spleen: Normal in size without focal abnormality. Adrenals/Urinary Tract: Adrenal glands are unremarkable. Kidneys are normal, without renal calculi, focal lesion, or hydronephrosis. Bladder is unremarkable. Stomach/Bowel: Stomach, small bowel, and colon are not abnormally distended. No wall thickening or inflammatory changes appreciated. Appendix is normal. Vascular/Lymphatic: Aortic atherosclerosis. No enlarged abdominal or pelvic lymph nodes. Reproductive: Status post hysterectomy. No adnexal masses. Linear metallic density or suture in the low pelvis, unchanged since 2014 study. Other: No free air or free fluid in the abdomen. Abdominal wall musculature appears intact. Musculoskeletal: No acute or significant osseous findings. IMPRESSION: No acute process demonstrated in the abdomen or pelvis. No evidence of bowel obstruction or inflammation. Aortic Atherosclerosis (ICD10-I70.0). Electronically Signed   By: WLucienne CapersM.D.   On: 05/11/2018 00:49    Pending Labs Unresulted Labs (From admission, onward)    Start     Ordered   05/18/18 0500  Creatinine, serum  (enoxaparin (LOVENOX)    CrCl >/= 30 ml/min)  Weekly,   R    Comments:  while on enoxaparin therapy    05/11/18 0810   05/12/18 01694 Basic metabolic panel  Tomorrow morning,   R     05/11/18 0810   05/12/18 0500  CBC  Tomorrow morning,   R     05/11/18 0810   05/11/18 0810  C difficile quick scan w PCR reflex  (C Difficile quick screen w PCR reflex panel)  Once, for 24 hours,   R     05/11/18  0350   05/11/18 0806  HIV antibody (Routine Testing)  Once,   R     05/11/18 0810   05/11/18 0806  CBC   (enoxaparin (LOVENOX)    CrCl >/= 30 ml/min)  Once,   R    Comments:  Baseline for enoxaparin therapy IF NOT ALREADY DRAWN.  Notify MD if PLT < 100 K.    05/11/18 0810   05/11/18 0806  Creatinine, serum  (enoxaparin (LOVENOX)    CrCl >/= 30 ml/min)  Once,   R    Comments:  Baseline for enoxaparin therapy IF NOT ALREADY DRAWN.    05/11/18 0810          Vitals/Pain Today's Vitals   05/11/18 0330 05/11/18 0651 05/11/18 0700 05/11/18 0736  BP: (!) 151/87 (!) 157/104 (!) 156/94 (!) 165/69  Pulse: 90 84  82  Resp: 17 18  18   Temp:      TempSrc:      SpO2: 100% 98%  100%  Weight:      Height:      PainSc:        Isolation Precautions Enteric precautions (UV disinfection)  Medications Medications  iopamidol (ISOVUE-300) 61 % injection (has no administration in time range)  sodium chloride (PF) 0.9 % injection (has no administration in time range)  aspirin chewable tablet 81 mg (has no administration in time range)  HYDROcodone-acetaminophen (NORCO) 7.5-325 MG per tablet 1 tablet (has no administration in time range)  fentaNYL (SUBLIMAZE) injection 25 mcg (has no administration in time range)  amLODipine (NORVASC) tablet 5 mg (has no administration in time range)  metoprolol tartrate (LOPRESSOR) tablet 25 mg (has no administration in time range)  nitroGLYCERIN (NITROSTAT) SL tablet 0.4 mg (has no administration in time range)  Suvorexant TABS 20 mg (has no administration in time range)  buPROPion (WELLBUTRIN SR) 12 hr tablet 150-300 mg (has no administration in time range)  DULoxetine (CYMBALTA) DR capsule 120 mg (has no administration in time range)  temazepam (RESTORIL) capsule 15 mg (has no administration in time range)  LORazepam (ATIVAN) injection 1 mg (has no administration in time range)  levothyroxine (SYNTHROID, LEVOTHROID) tablet 25 mcg (has no administration in time range)  pantoprazole (PROTONIX) EC tablet 40 mg (has no administration in time range)  clopidogrel  (PLAVIX) tablet 75 mg (has no administration in time range)  folic acid (FOLVITE) tablet 1 mg (has no administration in time range)  gabapentin (NEURONTIN) capsule 100 mg (has no administration in time range)  ergocalciferol (VITAMIN D2) capsule 50,000 Units (has no administration in time range)  albuterol (PROVENTIL) (5 MG/ML) 0.5% nebulizer solution 2.5 mg (has no administration in time range)  montelukast (SINGULAIR) tablet 10 mg (has no administration in time range)  mometasone-formoterol (DULERA) 200-5 MCG/ACT inhaler 2 puff (2 puffs Inhalation Not Given 05/11/18 0825)  enoxaparin (LOVENOX) injection 40 mg (has no administration in time range)  lactated ringers infusion (has no administration in time range)  acetaminophen (TYLENOL) tablet 650 mg (has no administration in time range)    Or  acetaminophen (TYLENOL) suppository 650 mg (has no administration in time range)  promethazine (PHENERGAN) tablet 12.5 mg (has no administration in time range)  sodium chloride flush (NS) 0.9 % injection 3 mL (3 mLs Intravenous Given 05/11/18 0017)  ondansetron (ZOFRAN-ODT) disintegrating tablet 4 mg (4 mg Oral Given 05/10/18 1933)  sodium chloride 0.9 % bolus 1,000 mL (0 mLs Intravenous Stopped 05/11/18 0108)  ondansetron (ZOFRAN) injection 4 mg (4 mg Intravenous  Given 05/10/18 2337)  fentaNYL (SUBLIMAZE) injection 50 mcg (50 mcg Intravenous Given 05/10/18 2341)  iopamidol (ISOVUE-300) 61 % injection 100 mL (70 mLs Intravenous Contrast Given 05/11/18 0030)  sodium chloride 0.9 % bolus 1,000 mL (0 mLs Intravenous Stopped 05/11/18 0310)  ondansetron (ZOFRAN) injection 4 mg (4 mg Intravenous Given 05/11/18 0328)    Mobility walks Low fall risk   Focused Assessments    R Recommendations: See Admitting Provider Note  Report given to:   Additional Notes: possible Cdiff

## 2018-05-11 NOTE — ED Notes (Signed)
Urine and culture sent to lab  

## 2018-05-11 NOTE — ED Notes (Signed)
Pt stated she is feeling nauseous again. PA aware

## 2018-05-11 NOTE — ED Notes (Signed)
[name] called (husband)

## 2018-05-11 NOTE — ED Notes (Addendum)
Bladder scan- 109 mL. Pt stated she felt like she had an urge to pee. Pt ambulated to bathroom with 1-person assist

## 2018-05-11 NOTE — ED Notes (Signed)
Pt requesting IV rather than port accessed. Stated "I only want to use that for chemo"

## 2018-05-12 LAB — BASIC METABOLIC PANEL
Anion gap: 10 (ref 5–15)
BUN: 39 mg/dL — ABNORMAL HIGH (ref 8–23)
CO2: 23 mmol/L (ref 22–32)
Calcium: 8.3 mg/dL — ABNORMAL LOW (ref 8.9–10.3)
Chloride: 103 mmol/L (ref 98–111)
Creatinine, Ser: 0.84 mg/dL (ref 0.44–1.00)
GFR calc Af Amer: >60 mL/min (ref >60)
GFR calc non Af Amer: >60 mL/min (ref >60)
Glucose, Bld: 96 mg/dL (ref 70–99)
Potassium: 3.5 mmol/L (ref 3.5–5.1)
SODIUM: 136 mmol/L (ref 135–145)

## 2018-05-12 LAB — GASTROINTESTINAL PANEL BY PCR, STOOL (REPLACES STOOL CULTURE)

## 2018-05-12 LAB — CBC
HCT: 30.8 % — ABNORMAL LOW (ref 36.0–46.0)
Hemoglobin: 9.9 g/dL — ABNORMAL LOW (ref 12.0–15.0)
MCH: 29.5 pg (ref 26.0–34.0)
MCHC: 32.1 g/dL (ref 30.0–36.0)
MCV: 91.7 fL (ref 80.0–100.0)
Platelets: 82 10*3/uL — ABNORMAL LOW (ref 150–400)
RBC: 3.36 MIL/uL — ABNORMAL LOW (ref 3.87–5.11)
RDW: 13.3 % (ref 11.5–15.5)
WBC: 5.2 10*3/uL (ref 4.0–10.5)
nRBC: 0 % (ref 0.0–0.2)

## 2018-05-12 LAB — TSH: TSH: 2.542 u[IU]/mL (ref 0.350–4.500)

## 2018-05-12 LAB — CLOSTRIDIUM DIFFICILE BY PCR, REFLEXED: Toxigenic C. Difficile by PCR: NEGATIVE

## 2018-05-12 LAB — HIV ANTIBODY (ROUTINE TESTING W REFLEX): HIV Screen 4th Generation wRfx: NONREACTIVE

## 2018-05-12 MED ORDER — SODIUM CHLORIDE 0.9% FLUSH
10.0000 mL | INTRAVENOUS | Status: DC | PRN
  Administered 2018-05-14: 10 mL
  Filled 2018-05-12: qty 40

## 2018-05-12 MED ORDER — METOPROLOL TARTRATE 25 MG PO TABS
25.0000 mg | ORAL_TABLET | Freq: Two times a day (BID) | ORAL | Status: DC
  Administered 2018-05-12 – 2018-05-14 (×5): 25 mg via ORAL
  Filled 2018-05-12 (×5): qty 1

## 2018-05-12 MED ORDER — NAPHAZOLINE-GLYCERIN 0.012-0.2 % OP SOLN
2.0000 [drp] | Freq: Four times a day (QID) | OPHTHALMIC | Status: DC | PRN
  Filled 2018-05-12: qty 15

## 2018-05-12 MED ORDER — LEVOTHYROXINE SODIUM 25 MCG PO TABS
25.0000 ug | ORAL_TABLET | Freq: Every day | ORAL | Status: DC
  Administered 2018-05-12 – 2018-05-14 (×3): 25 ug via ORAL
  Filled 2018-05-12 (×3): qty 1

## 2018-05-12 NOTE — Progress Notes (Signed)
PROGRESS NOTE    [name]  KTG:[id number] DOB: 06-18-1955 DOA: 05/10/2018 PCP: [name], NP   Brief Narrative:  [name] is a 63 y.o. female with history h/o CAD, depression, fibromyalgia, GERD, asthma, SVT, recently diagnosed stage IV lung cancer (PET scan per patient showed bilateral lung mets) who follows Dr. Julien Nordmann for oncology and been receiving chemotherapy for the last 6 weeks presents with complaints of nausea vomiting and diarrhea.  Patient states her last chemotherapy session was 2 weeks back. Assessment & Plan:   Active Problems:   Nausea and vomiting   AKI (acute kidney injury) ([place])  1.  Acute severe gastroenteritis: Viral versus bacterial versus chemotherapy related.improving.  Supportive management with antiemetics.  Will initiate antidiarrheals if C. difficile negative.   CT abdomen unremarkable for colitis or other intra-abdominal abnormalities. Started onclears , will advance as tolerated.   2.  AKI: Present on admission, improving.   3.  Stage IV lung cancer: Follow-up primary oncologist upon discharge.  Nebulizer treatments PRN  4.  Depression/fibromyalgia: Resume home medications  5.  SVT: Resume beta-blockers  6.  Hypertension: Resume Norvasc at a lower dose given problem #1.  Resume beta-blockers  7.  CAD: Resume aspirin, Plavix and beta-blockers   8.  Hypothyroidism: Synthroid     DVT prophylaxis: (Lovenox.  Code Status: full code.  Family Communication: none at bedside.  Disposition Plan: pending clinical improvement.   Consultants:   None.   Procedures: none.   Antimicrobials: none.    Subjective: Still very nauseated. , 10 bowel movements overnight.   Objective: Vitals:   05/12/18 0444 05/12/18 1030 05/12/18 1224 05/12/18 1456  BP: (!) 156/93  134/85 124/77  Pulse: 86  89 74  Resp: (!) 22     Temp: 97.9 F (36.6 C)   98 F (36.7 C)  TempSrc: Oral   Oral  SpO2: 99% 99%  97%  Weight:      Height:         Intake/Output Summary (Last 24 hours) at 05/12/2018 1847 Last data filed at 05/12/2018 1500 Gross per 24 hour  Intake 2300.43 ml  Output 200 ml  Net 2100.43 ml   Filed Weights   05/10/18 1928  Weight: 72.6 kg    Examination:  General exam: Appears calm and comfortable  Respiratory system: Clear to auscultation. Respiratory effort normal. Cardiovascular system: S1 & S2 heard, RRR.  Gastrointestinal system: Abdomen is soft  And mildly tender.  Central nervous system: Alert and oriented. No focal neurological deficits. Extremities: Symmetric 5 x 5 power. Skin: No rashes, lesions or ulcers Psychiatry: Judgement and insight appear normal. Mood & affect appropriate.     Data Reviewed: I have personally reviewed following labs and imaging studies  CBC: Recent Labs  Lab 05/09/18 1418 05/10/18 1946 05/11/18 1035 05/12/18 0345  WBC 7.1 6.1 6.3 5.2  HGB 13.0 13.5 10.9* 9.9*  HCT 39.9 40.8 32.2* 30.8*  MCV 88.5 87.9 89.4 91.7  PLT 237 200 118* 82*   Basic Metabolic Panel: Recent Labs  Lab 05/09/18 1418 05/10/18 1946 05/11/18 1035 05/12/18 0345  NA 135 135  --  136  K 4.4 4.2  --  3.5  CL 100 97*  --  103  CO2 21* 24  --  23  GLUCOSE 138* 192*  --  96  BUN 27* 47*  --  39*  CREATININE 0.96 1.51* 1.27* 0.84  CALCIUM 9.5 9.7  --  8.3*   GFR: Estimated Creatinine Clearance:  66.7 mL/min (by C-G formula based on SCr of 0.84 mg/dL). Liver Function Tests: Recent Labs  Lab 05/09/18 1418 05/10/18 1946  AST 62* 63*  ALT 159* 144*  ALKPHOS 190* 167*  BILITOT 1.1 0.8  PROT 7.6 8.1  ALBUMIN 4.3 4.7   Recent Labs  Lab 05/09/18 1418 05/10/18 1946  LIPASE 37 37   No results for input(s): AMMONIA in the last 168 hours. Coagulation Profile: No results for input(s): INR, PROTIME in the last 168 hours. Cardiac Enzymes: No results for input(s): CKTOTAL, CKMB, CKMBINDEX, TROPONINI in the last 168 hours. BNP (last 3 results) Recent Labs    01/10/18 1630  PROBNP 69    HbA1C: No results for input(s): HGBA1C in the last 72 hours. CBG: No results for input(s): GLUCAP in the last 168 hours. Lipid Profile: No results for input(s): CHOL, HDL, LDLCALC, TRIG, CHOLHDL, LDLDIRECT in the last 72 hours. Thyroid Function Tests: Recent Labs    05/12/18 0345  TSH 2.542   Anemia Panel: No results for input(s): VITAMINB12, FOLATE, FERRITIN, TIBC, IRON, RETICCTPCT in the last 72 hours. Sepsis Labs: No results for input(s): PROCALCITON, LATICACIDVEN in the last 168 hours.  Recent Results (from the past 240 hour(s))  Gastrointestinal Panel by PCR , Stool     Status: None   Collection Time: 05/11/18  4:48 PM  Result Value Ref Range Status   Campylobacter species NOT DETECTED NOT DETECTED Final   Plesimonas shigelloides NOT DETECTED NOT DETECTED Final   Salmonella species NOT DETECTED NOT DETECTED Final   Yersinia enterocolitica NOT DETECTED NOT DETECTED Final   Vibrio species NOT DETECTED NOT DETECTED Final   Vibrio cholerae NOT DETECTED NOT DETECTED Final   Enteroaggregative E coli (EAEC) NOT DETECTED NOT DETECTED Final   Enteropathogenic E coli (EPEC) NOT DETECTED NOT DETECTED Final   Enterotoxigenic E coli (ETEC) NOT DETECTED NOT DETECTED Final   Shiga like toxin producing E coli (STEC) NOT DETECTED NOT DETECTED Final   Shigella/Enteroinvasive E coli (EIEC) NOT DETECTED NOT DETECTED Final   Cryptosporidium NOT DETECTED NOT DETECTED Final   Cyclospora cayetanensis NOT DETECTED NOT DETECTED Final   Entamoeba histolytica NOT DETECTED NOT DETECTED Final   Giardia lamblia NOT DETECTED NOT DETECTED Final   Adenovirus F40/41 NOT DETECTED NOT DETECTED Final   Astrovirus NOT DETECTED NOT DETECTED Final   Norovirus GI/GII NOT DETECTED NOT DETECTED Final   Rotavirus A NOT DETECTED NOT DETECTED Final   Sapovirus (I, II, IV, and V) NOT DETECTED NOT DETECTED Final    Comment: Performed at [facility], [place] [place], [place] [address]  C  difficile quick scan w PCR reflex     Status: Abnormal   Collection Time: 05/11/18  4:49 PM  Result Value Ref Range Status   C Diff antigen POSITIVE (A) NEGATIVE Final   C Diff toxin NEGATIVE NEGATIVE Final   C Diff interpretation Results are indeterminate. See PCR results.  Final    Comment: Performed at [facility], [place] [address], [place], [place] [address]  C. Diff by PCR, Reflexed     Status: None   Collection Time: 05/11/18  4:49 PM  Result Value Ref Range Status   Toxigenic C. Difficile by PCR NEGATIVE NEGATIVE Final    Comment: Patient is colonized with non toxigenic C. difficile. May not need treatment unless significant symptoms are present. Performed at [facility], [place] [address], [place], [place] [address]  Radiology Studies: Ct Abdomen Pelvis W Contrast  Result Date: 05/11/2018 CLINICAL DATA:  Nausea, vomiting, diarrhea. Ongoing chemotherapy for lung cancer. MEDICATIONS not seen. EXAM: CT ABDOMEN AND PELVIS WITH CONTRAST TECHNIQUE: Multidetector CT imaging of the abdomen and pelvis was performed using the standard protocol following bolus administration of intravenous contrast. CONTRAST:  78m ISOVUE-300 IOPAMIDOL (ISOVUE-300) INJECTION 61% COMPARISON:  CT abdomen and pelvis 12/08/2012. PET-CT 02/03/2018 FINDINGS: Lower chest: Lung bases are clear. Hepatobiliary: No focal liver abnormality is seen. Status post cholecystectomy. No biliary dilatation. Pancreas: Unremarkable. No pancreatic ductal dilatation or surrounding inflammatory changes. Spleen: Normal in size without focal abnormality. Adrenals/Urinary Tract: Adrenal glands are unremarkable. Kidneys are normal, without renal calculi, focal lesion, or hydronephrosis. Bladder is unremarkable. Stomach/Bowel: Stomach, small bowel, and colon are not abnormally distended. No wall thickening or inflammatory changes appreciated. Appendix is normal. Vascular/Lymphatic: Aortic atherosclerosis. No  enlarged abdominal or pelvic lymph nodes. Reproductive: Status post hysterectomy. No adnexal masses. Linear metallic density or suture in the low pelvis, unchanged since 2014 study. Other: No free air or free fluid in the abdomen. Abdominal wall musculature appears intact. Musculoskeletal: No acute or significant osseous findings. IMPRESSION: No acute process demonstrated in the abdomen or pelvis. No evidence of bowel obstruction or inflammation. Aortic Atherosclerosis (ICD10-I70.0). Electronically Signed   By: WLucienne CapersM.D.   On: 05/11/2018 00:49        Scheduled Meds:  amLODipine  5 mg Oral Daily   aspirin  81 mg Oral Daily   buPROPion  300 mg Oral Daily   And   buPROPion  150 mg Oral QHS   clopidogrel  75 mg Oral Daily   DULoxetine  120 mg Oral Daily   enoxaparin (LOVENOX) injection  40 mg Subcutaneous [id number]  folic acid  1 mg Oral Daily   gabapentin  100 mg Oral Daily   levothyroxine  25 mcg Oral Q0600   metoprolol tartrate  25 mg Oral BID   mometasone-formoterol  2 puff Inhalation BID   montelukast  10 mg Oral QHS   pantoprazole  40 mg Oral QAC supper   temazepam  15 mg Oral QHS   Vitamin D (Ergocalciferol)  50,000 Units Oral Q Wed   Continuous Infusions:  lactated ringers 100 mL/hr at 05/12/18 1515     LOS: 1 day    Time spent: 25 min    VHosie Poisson MD [facility] Pager [phone]  If 7PM-7AM, please contact night-coverage [email or web address] Password [facility]3/01/2019, 6:47 PM

## 2018-05-13 ENCOUNTER — Inpatient Hospital Stay (HOSPITAL_COMMUNITY): Payer: Managed Care, Other (non HMO)

## 2018-05-13 ENCOUNTER — Other Ambulatory Visit: Payer: Self-pay | Admitting: Internal Medicine

## 2018-05-13 ENCOUNTER — Encounter (HOSPITAL_COMMUNITY): Payer: Self-pay | Admitting: Radiology

## 2018-05-13 LAB — RETICULOCYTES
Immature Retic Fract: 3.2 % (ref 2.3–15.9)
RBC.: 2.78 MIL/uL — ABNORMAL LOW (ref 3.87–5.11)
Retic Count, Absolute: 2.5 10*3/uL — ABNORMAL LOW (ref 19.0–186.0)
Retic Ct Pct: 0.1 % — ABNORMAL LOW (ref 0.4–3.1)

## 2018-05-13 LAB — IRON AND TIBC
Iron: 115 ug/dL (ref 28–170)
Saturation Ratios: 51 % — ABNORMAL HIGH (ref 10.4–31.8)
TIBC: 227 ug/dL — ABNORMAL LOW (ref 250–450)
UIBC: 112 ug/dL

## 2018-05-13 LAB — VITAMIN B12: Vitamin B-12: 139 pg/mL — ABNORMAL LOW (ref 180–914)

## 2018-05-13 LAB — FERRITIN: Ferritin: 261 ng/mL (ref 11–307)

## 2018-05-13 LAB — FOLATE: Folate: 37.9 ng/mL (ref >5.9)

## 2018-05-13 NOTE — TOC Initial Note (Signed)
Transition of Care [facility]) - Initial/Assessment Note    Patient Details  Name: [name] MRN: [id number] Date of Birth: 11/19/1955  Transition of Care [facility]) CM/SW Contact:    Leeroy Cha, RN Phone Number: 05/13/2018, 11:16 AM  Clinical Narrative:                 [name] a 63 y.o.femalewith history h/oCAD, depression, fibromyalgia, GERD, asthma, SVT, recently diagnosed stage IV lung cancer (PET scan per patient showed bilateral lung mets) who follows Dr. Julien Nordmann for oncology and been receiving chemotherapy for the last 6 weeks presents with complaints of nausea vomiting and diarrhea. Patient states her last chemotherapy session was 2 weeks back. Assessment & Plan:   Active Problems:   Nausea and vomiting   AKI (acute kidney injury) ([place])  1.Acute severe gastroenteritis: Viral versus bacterial versus chemotherapy related.improving. Supportive management with antiemetics. Will initiate antidiarrheals if C. difficile negative. CT abdomen unremarkable for colitis or other intra-abdominal abnormalities. Started onclears , will advance as tolerated.   2.AKI: Present on admission, improving.   3.Stage IV lung cancer: Follow-up primary oncologist upon discharge. Nebulizer treatments PRN  4.Depression/fibromyalgia: Resume home medications  5.SVT: Resume beta-blockers  6.Hypertension: Resume Norvasc at a lower dose given problem #1. Resume beta-blockers  7.GEX:BMWUXL aspirin, Plavix and beta-blockers   8.Hypothyroidism: Synthroid  Expected Discharge Plan: Home/Self Care Barriers to Discharge: No Barriers Identified   Patient Goals and CMS Choice        Expected Discharge Plan and Services Expected Discharge Plan: Home/Self Care     Living arrangements for the past 2 months: Single Family Home Expected Discharge Date: (unknown)                        Prior Living Arrangements/Services Living arrangements for the past 2  months: Single Family Home Lives with:: Spouse Patient language and need for interpreter reviewed:: No Do you feel safe going back to the place where you live?: Yes      Need for Family Participation in Patient Care: No (Comment) Care giver support system in place?: Yes (comment)   Criminal Activity/Legal Involvement Pertinent to Current Situation/Hospitalization: No - Comment as needed  Activities of Daily Living Home Assistive Devices/Equipment: Dentures (specify type), Eyeglasses, Nebulizer(upper/lower dentures) ADL Screening (condition at time of admission) Patient's cognitive ability adequate to safely complete daily activities?: Yes Is the patient deaf or have difficulty hearing?: No Does the patient have difficulty seeing, even when wearing glasses/contacts?: No Does the patient have difficulty concentrating, remembering, or making decisions?: No Patient able to express need for assistance with ADLs?: Yes Does the patient have difficulty dressing or bathing?: No Independently performs ADLs?: Yes (appropriate for developmental age) Does the patient have difficulty walking or climbing stairs?: Yes(secondary to weakness) Weakness of Legs: Both Weakness of Arms/Hands: Both  Permission Sought/Granted                  Emotional Assessment Appearance:: Appears stated age Attitude/Demeanor/Rapport: Reactive Affect (typically observed): Accepting, Adaptable, Calm   Alcohol / Substance Use: Never Used Psych Involvement: No (comment)  Admission diagnosis:  AKI (acute kidney injury) ([place]) [N17.9] Nausea vomiting and diarrhea [R11.2, R19.7] Patient Active Problem List   Diagnosis Date Noted  . Nausea and vomiting 05/11/2018  . AKI (acute kidney injury) ([place]) 05/11/2018  . Port-A-Cath in place 05/02/2018  . Adenocarcinoma of left lung, stage 4 ([place]) 04/05/2018  . Encounter for antineoplastic chemotherapy 04/05/2018  .  Encounter for antineoplastic immunotherapy 04/05/2018  .  Goals of care, counseling/discussion 04/05/2018  . Tobacco abuse counseling 03/05/2018  . PVC's (premature ventricular contractions)   . SVT (supraventricular tachycardia) ([place])   . Premature atrial contractions   . Unstable angina ([place]) 12/07/2012  . Tachycardia 02/19/2009  . CAD, NATIVE VESSEL 06/04/2008  . Hyperlipemia 05/29/2008  . DEPRESSION 05/29/2008  . ASTHMA 05/29/2008  . GERD 05/29/2008  . Irritable bowel syndrome 05/29/2008  . DEGENERATIVE JOINT DISEASE, LUMBAR SPINE 05/29/2008  . FIBROMYALGIA 05/29/2008  . CHEST PAIN-PRECORDIAL 05/07/2008   PCP:  [name], NP Pharmacy:   [facility], [place] - [address] AT [address] [place] [address] Phone: [phone] Fax: [place], [place] [address] Phone: [phone] Fax: [phone]  [facility], [place] [address] [place] [facility] [place] [address] Phone: [phone] Fax: [phone]     Social Determinants of Health ([place]) Interventions    Readmission Risk Interventions 30 Day Unplanned Readmission Risk Score     ED to Hosp-Admission (Current) from 05/10/2018 in [place]  30 Day Unplanned Readmission Risk Score (%)  27 Filed at 05/13/2018 0801     This score is the patient's risk of an unplanned readmission within 30 days of being discharged (0 -100%). The score is based on dignosis, age, lab data, medications, orders, and past utilization.   Low:  0-14.9   Medium: 15-21.9   High: 22-29.9   Extreme: 30 and above       No flowsheet data found.

## 2018-05-13 NOTE — Care Management Important Message (Signed)
Important Message  Patient Details  Name: [name] MRN: [id number] Date of Birth: 09/04/1955   Medicare Important Message Given:  Yes    Kerin Salen 05/13/2018, 11:49 AMImportant Message  Patient Details  Name: [name] MRN: [id number] Date of Birth: 05/09/1955   Medicare Important Message Given:  Yes    Kerin Salen 05/13/2018, 11:48 AM

## 2018-05-13 NOTE — Progress Notes (Signed)
PROGRESS NOTE    [name]  FYB:[id number] DOB: 02-Sep-1955 DOA: 05/10/2018 PCP: [name], NP   Brief Narrative:  [name] is a 63 y.o. female with history h/o CAD, depression, fibromyalgia, GERD, asthma, SVT, recently diagnosed stage IV lung cancer (PET scan per patient showed bilateral lung mets) who follows Dr. Julien Nordmann for oncology and been receiving chemotherapy for the last 6 weeks presents with complaints of nausea vomiting and diarrhea.  Patient states her last chemotherapy session was 2 weeks back. Assessment & Plan:   Active Problems:   Nausea and vomiting   AKI (acute kidney injury) ([place])  1.  Acute severe gastroenteritis: Viral versus bacterial versus chemotherapy related.improving.  Supportive management with antiemetics.   CT abdomen unremarkable for colitis or other intra-abdominal abnormalities. Started onclears , will advance as tolerated.  Able to tolerate clears, advance to solids today and monitor. Only a few bowel  Movements last night.   2.  AKI: Present on admission, improving.   3.  Stage IV lung cancer: Follow-up primary oncologist upon discharge.  Nebulizer treatments PRN  4.  Depression/fibromyalgia: Resume home medications  5.  SVT: Resume beta-blockers. Pt denies any symptoms.   6.  Hypertension: Resume Norvasc at a lower dose given problem #1.  Resume beta-blockers  7.  CAD: Resume aspirin, Plavix and beta-blockers   8.  Hypothyroidism: Synthroid   Anemia of chronic disease.  Anemia panel shows low vitamin B12 levels.  Supplementation ordered.    Patient reports dysarthria. Thick tongue since many days.  No focal deficits.  CT head negative for acute pathology. Recent MRI brain with and without contrast does nto show any metastatic disease.       DVT prophylaxis: Lovenox.  Code Status: Full code.  Family Communication: none at bedside.  Disposition Plan: pending clinical improvement.   Consultants:   None.    Procedures: none.   Antimicrobials: none.    Subjective: Nausea improved.  Few watery bowel movements overnight.   Objective: Vitals:   05/12/18 2136 05/13/18 0445 05/13/18 1002 05/13/18 1300  BP:  109/69 111/75 124/76  Pulse:  72 70 75  Resp:  18  (!) 21  Temp:  98 F (36.7 C)  98.3 F (36.8 C)  TempSrc:  Oral  Oral  SpO2: 97% 97%  98%  Weight:      Height:        Intake/Output Summary (Last 24 hours) at 05/13/2018 1625 Last data filed at 05/13/2018 0900 Gross per 24 hour  Intake 1398.97 ml  Output -  Net 1398.97 ml   Filed Weights   05/10/18 1928  Weight: 72.6 kg    Examination:  General exam: Appears calm and comfortable , not in distress.  Respiratory system: Clear to auscultation. Respiratory effort normal. Cardiovascular system: S1 & S2 heard, RRR.  Gastrointestinal system: Abdomen is soft  And mildly tender. Bowel sounds good.  Central nervous system: Alert and oriented. No focal neurological deficits. Extremities: Symmetric 5 x 5 power. Skin: No rashes, lesions or ulcers Psychiatry:  Mood & affect appropriate.     Data Reviewed: I have personally reviewed following labs and imaging studies  CBC: Recent Labs  Lab 05/09/18 1418 05/10/18 1946 05/11/18 1035 05/12/18 0345  WBC 7.1 6.1 6.3 5.2  HGB 13.0 13.5 10.9* 9.9*  HCT 39.9 40.8 32.2* 30.8*  MCV 88.5 87.9 89.4 91.7  PLT 237 200 118* 82*   Basic Metabolic Panel: Recent Labs  Lab 05/09/18 1418 05/10/18 1946 05/11/18  1035 05/12/18 0345  NA 135 135  --  136  K 4.4 4.2  --  3.5  CL 100 97*  --  103  CO2 21* 24  --  23  GLUCOSE 138* 192*  --  96  BUN 27* 47*  --  39*  CREATININE 0.96 1.51* 1.27* 0.84  CALCIUM 9.5 9.7  --  8.3*   GFR: Estimated Creatinine Clearance: 66.7 mL/min (by C-G formula based on SCr of 0.84 mg/dL). Liver Function Tests: Recent Labs  Lab 05/09/18 1418 05/10/18 1946  AST 62* 63*  ALT 159* 144*  ALKPHOS 190* 167*  BILITOT 1.1 0.8  PROT 7.6 8.1  ALBUMIN 4.3  4.7   Recent Labs  Lab 05/09/18 1418 05/10/18 1946  LIPASE 37 37   No results for input(s): AMMONIA in the last 168 hours. Coagulation Profile: No results for input(s): INR, PROTIME in the last 168 hours. Cardiac Enzymes: No results for input(s): CKTOTAL, CKMB, CKMBINDEX, TROPONINI in the last 168 hours. BNP (last 3 results) Recent Labs    01/10/18 1630  PROBNP 69   HbA1C: No results for input(s): HGBA1C in the last 72 hours. CBG: No results for input(s): GLUCAP in the last 168 hours. Lipid Profile: No results for input(s): CHOL, HDL, LDLCALC, TRIG, CHOLHDL, LDLDIRECT in the last 72 hours. Thyroid Function Tests: Recent Labs    05/12/18 0345  TSH 2.542   Anemia Panel: Recent Labs    05/13/18 1353  VITAMINB12 139*  FOLATE 37.9  FERRITIN 261  TIBC 227*  IRON 115  RETICCTPCT 0.1*   Sepsis Labs: No results for input(s): PROCALCITON, LATICACIDVEN in the last 168 hours.  Recent Results (from the past 240 hour(s))  Gastrointestinal Panel by PCR , Stool     Status: None   Collection Time: 05/11/18  4:48 PM  Result Value Ref Range Status   Campylobacter species NOT DETECTED NOT DETECTED Final   Plesimonas shigelloides NOT DETECTED NOT DETECTED Final   Salmonella species NOT DETECTED NOT DETECTED Final   Yersinia enterocolitica NOT DETECTED NOT DETECTED Final   Vibrio species NOT DETECTED NOT DETECTED Final   Vibrio cholerae NOT DETECTED NOT DETECTED Final   Enteroaggregative E coli (EAEC) NOT DETECTED NOT DETECTED Final   Enteropathogenic E coli (EPEC) NOT DETECTED NOT DETECTED Final   Enterotoxigenic E coli (ETEC) NOT DETECTED NOT DETECTED Final   Shiga like toxin producing E coli (STEC) NOT DETECTED NOT DETECTED Final   Shigella/Enteroinvasive E coli (EIEC) NOT DETECTED NOT DETECTED Final   Cryptosporidium NOT DETECTED NOT DETECTED Final   Cyclospora cayetanensis NOT DETECTED NOT DETECTED Final   Entamoeba histolytica NOT DETECTED NOT DETECTED Final   Giardia  lamblia NOT DETECTED NOT DETECTED Final   Adenovirus F40/41 NOT DETECTED NOT DETECTED Final   Astrovirus NOT DETECTED NOT DETECTED Final   Norovirus GI/GII NOT DETECTED NOT DETECTED Final   Rotavirus A NOT DETECTED NOT DETECTED Final   Sapovirus (I, II, IV, and V) NOT DETECTED NOT DETECTED Final    Comment: Performed at [facility], [address] [place], [place] [address]  C difficile quick scan w PCR reflex     Status: Abnormal   Collection Time: 05/11/18  4:49 PM  Result Value Ref Range Status   C Diff antigen POSITIVE (A) NEGATIVE Final   C Diff toxin NEGATIVE NEGATIVE Final   C Diff interpretation Results are indeterminate. See PCR results.  Final    Comment: Performed at [facility], [address] [place], [place] [address]  C. Diff by PCR, Reflexed     Status: None   Collection Time: 05/11/18  4:49 PM  Result Value Ref Range Status   Toxigenic C. Difficile by PCR NEGATIVE NEGATIVE Final    Comment: Patient is colonized with non toxigenic C. difficile. May not need treatment unless significant symptoms are present. Performed at [facility], [place] [address] [place], [place] [address]          Radiology Studies: Ct Head Wo Contrast  Result Date: 05/13/2018 CLINICAL DATA:  TIA.  Weakness and confusion.  Lung cancer. EXAM: CT HEAD WITHOUT CONTRAST TECHNIQUE: Contiguous axial images were obtained from the base of the skull through the vertex without intravenous contrast. COMPARISON:  None. FINDINGS: Brain: Ventricle size and cerebral volume normal for age. Mild changes in the periventricular white matter appear chronic. No acute infarct, hemorrhage, mass. No edema. Vascular: Negative for hyperdense vessel Skull: Negative Sinuses/Orbits: Negative Other: None IMPRESSION: No acute intracranial abnormality. Mild chronic white matter changes most likely due to chronic microvascular ischemia If there is concern of metastatic lung cancer, MRI of the  brain without and with contrast recommended. If the patient could not have MRI, postcontrast CT recommended as an alternative. Electronically Signed   By: Franchot Gallo M.D.   On: 05/13/2018 11:21        Scheduled Meds: . amLODipine  5 mg Oral Daily  . aspirin  81 mg Oral Daily  . buPROPion  300 mg Oral Daily   And  . buPROPion  150 mg Oral QHS  . DULoxetine  120 mg Oral Daily  . folic acid  1 mg Oral Daily  . gabapentin  100 mg Oral Daily  . levothyroxine  25 mcg Oral Q0600  . metoprolol tartrate  25 mg Oral BID  . mometasone-formoterol  2 puff Inhalation BID  . montelukast  10 mg Oral QHS  . pantoprazole  40 mg Oral QAC supper  . temazepam  15 mg Oral QHS  . Vitamin D (Ergocalciferol)  50,000 Units Oral Q Wed   Continuous Infusions: . lactated ringers 75 mL/hr at 05/13/18 1230     LOS: 2 days    Time spent: 25 min    Hosie Poisson, MD [facility] Pager [phone]   If 7PM-7AM, please contact night-coverage [email or web address] Password [facility] 05/13/2018, 4:25 PM

## 2018-05-14 LAB — BASIC METABOLIC PANEL
Anion gap: 7 (ref 5–15)
BUN: 12 mg/dL (ref 8–23)
CALCIUM: 7.9 mg/dL — AB (ref 8.9–10.3)
CO2: 25 mmol/L (ref 22–32)
CREATININE: 0.82 mg/dL (ref 0.44–1.00)
Chloride: 108 mmol/L (ref 98–111)
GFR calc Af Amer: >60 mL/min (ref >60)
Glucose, Bld: 109 mg/dL — ABNORMAL HIGH (ref 70–99)
Potassium: 3.1 mmol/L — ABNORMAL LOW (ref 3.5–5.1)
Sodium: 140 mmol/L (ref 135–145)

## 2018-05-14 MED ORDER — POTASSIUM CHLORIDE CRYS ER 20 MEQ PO TBCR
40.0000 meq | EXTENDED_RELEASE_TABLET | Freq: Once | ORAL | Status: AC
  Administered 2018-05-14: 40 meq via ORAL
  Filled 2018-05-14: qty 2

## 2018-05-14 MED ORDER — GERHARDT'S BUTT CREAM
TOPICAL_CREAM | Freq: Four times a day (QID) | CUTANEOUS | Status: DC
  Administered 2018-05-14: 1 via TOPICAL
  Filled 2018-05-14: qty 1

## 2018-05-14 MED ORDER — VITAMIN B-12 100 MCG PO TABS: 100.0000 ug | ORAL_TABLET | Freq: Every day | ORAL | Status: DC

## 2018-05-14 MED ORDER — LORAZEPAM 1 MG PO TABS
1.0000 mg | ORAL_TABLET | Freq: Three times a day (TID) | ORAL | Status: DC | PRN
  Administered 2018-05-14: 1 mg via ORAL
  Filled 2018-05-14: qty 1

## 2018-05-14 MED ORDER — GERHARDT'S BUTT CREAM: 1.0000 "application " | TOPICAL_CREAM | Freq: Four times a day (QID) | CUTANEOUS | 0 refills | Status: DC

## 2018-05-14 MED ORDER — HEPARIN SOD (PORK) LOCK FLUSH 100 UNIT/ML IV SOLN
500.0000 [IU] | INTRAVENOUS | Status: AC | PRN
  Administered 2018-05-14: 500 [IU]

## 2018-05-14 MED ORDER — CYANOCOBALAMIN 100 MCG PO TABS: 100.0000 ug | ORAL_TABLET | Freq: Every day | ORAL | 1 refills | Status: DC

## 2018-05-14 MED ORDER — LOPERAMIDE HCL 2 MG PO CAPS: 2.0000 mg | ORAL_CAPSULE | ORAL | 0 refills | Status: DC | PRN

## 2018-05-14 MED ORDER — CYANOCOBALAMIN 1000 MCG/ML IJ SOLN
1000.0000 ug | Freq: Once | INTRAMUSCULAR | Status: AC
  Administered 2018-05-14: 1000 ug via INTRAMUSCULAR
  Filled 2018-05-14: qty 1

## 2018-05-14 NOTE — Evaluation (Signed)
Physical Therapy Evaluation Patient Details Name: [name] MRN: [id number] DOB: 17-Jun-1955 Today's Date: 05/14/2018   History of Present Illness  per MD : [name] is a 63 y.o. female with history h/o CAD, depression, fibromyalgia, GERD, asthma, SVT, recently diagnosed stage IV lung cancer (PET scan per patient showed bilateral lung mets) who follows Dr. Julien Nordmann for oncology and been receiving chemotherapy for the last 6 weeks presents with complaints of nausea vomiting and diarrhea.  Patient states her last chemotherapy session was 2 weeks back  Clinical Impression  Pt doing well and ambulating in room holding IV pole. She stated she does feel a little weak in her legs and arms and noted with basic functional mobility. Educated pt with sae ambulation and general exercise program for UE and LE , seated, supine and standing to continue as HEP. PT agreed and was very thankful for program, handout and education. Orange theraband  also given for UE program in [place] code [id number] No further PT needs at this time to sign off. Thanks      Follow Up Recommendations No PT follow up    Equipment Recommendations  None recommended by PT    Recommendations for Other Services       Precautions / Restrictions Precautions Precautions: None Restrictions Weight Bearing Restrictions: No      Mobility  Bed Mobility Overal bed mobility: Independent                Transfers Overall transfer level: Independent Equipment used: None                Ambulation/Gait Ambulation/Gait assistance: Independent Social research officer, government (Feet): 80 Feet Assistive device: IV Pole Gait Pattern/deviations: Step-through pattern     General Gait Details: slow and steady   Science writer    Modified Rankin (Stroke Patients Only)       Balance Overall balance assessment: Modified Independent                                            Pertinent Vitals/Pain Pain Assessment: 0-10 Pain Score: 2  Pain Location: mentioned pain with hemrrhoids, and skin breakdwon from diarrhea . told nursing so they can address these areas .  Pain Intervention(s): Monitored during session    Home Living Family/patient expects to be discharged to:: Private residence Living Arrangements: Spouse/significant other Available Help at Discharge: Family Type of Home: House                Prior Function Level of Independence: Independent         Comments: I like to stay moving and stay strong     Hand Dominance        Extremity/Trunk Assessment        Lower Extremity Assessment Lower Extremity Assessment: Generalized weakness       Communication   Communication: No difficulties  Cognition Arousal/Alertness: Awake/alert Behavior During Therapy: WFL for tasks assessed/performed Overall Cognitive Status: Within Functional Limits for tasks assessed                                        General Comments      Exercises Other Exercises Other Exercises: educated with total LR  supine and standing exercise program for strengthenign as well as general UE exercises with orange TB. in [place] adn handout given to pateint with code  [id number]   Assessment/Plan    PT Assessment Patent does not need any further PT services  PT Problem List Decreased strength       PT Treatment Interventions      PT Goals (Current goals can be found in the Care Plan section)  Acute Rehab PT Goals Patient Stated Goal: I want to get better and go home. I like to keep moving . I don't like these shaking!!  PT Goal Formulation: All assessment and education complete, DC therapy    Frequency     Barriers to discharge        Co-evaluation               AM-PAC PT "6 Clicks" Mobility  Outcome Measure Help needed turning from your back to your side while in a flat bed without using bedrails?: None Help needed  moving from lying on your back to sitting on the side of a flat bed without using bedrails?: None Help needed moving to and from a bed to a chair (including a wheelchair)?: None Help needed standing up from a chair using your arms (e.g., wheelchair or bedside chair)?: None Help needed to walk in hospital room?: A Little Help needed climbing 3-5 steps with a railing? : A Little 6 Click Score: 22    End of Session   Activity Tolerance: Patient tolerated treatment well Patient left: in bed Nurse Communication: Mobility status PT Visit Diagnosis: Muscle weakness (generalized) (M62.81)    Time: 4665-9935 PT Time Calculation (min) (ACUTE ONLY): 40 min   Charges:   PT Evaluation $PT Eval Low Complexity: 1 Low PT Treatments $Gait Training: 8-22 mins $Therapeutic Exercise: 8-22 mins        Clide Dales, PT Acute Rehabilitation Services Pager: [phone] Office: [phone] 05/14/2018   Clide Dales 05/14/2018, 12:43 PM

## 2018-05-14 NOTE — Evaluation (Signed)
Occupational Therapy Evaluation Patient Details Name: [name] MRN: [id number] DOB: September 19, 1955 Today's Date: 05/14/2018    History of Present Illness per MD : [name] is a 63 y.o. female with history h/o CAD, depression, fibromyalgia, GERD, asthma, SVT, recently diagnosed stage IV lung cancer (PET scan per patient showed bilateral lung mets) who follows Dr. Julien Nordmann for oncology and been receiving chemotherapy for the last 6 weeks presents with complaints of nausea vomiting and diarrhea.  Patient states her last chemotherapy session was 2 weeks back   Clinical Impression   Pt was admitted for nausea/vomiting.  She has been independent with adls but has had decreased endurance for IADLs.  Pt has a tub, and taking a shower has been effortful. Educated on energy conservation, tub bench, and strategies to decrease tremor while eating. PT gave her an HEP for UEs. No further OT is needed at this time     Follow Up Recommendations  No OT follow up    Equipment Recommendations  (pt will consider a tub bench)    Recommendations for Other Services       Precautions / Restrictions Precautions Precautions: None Restrictions Weight Bearing Restrictions: No      Mobility Bed Mobility Overal bed mobility: Independent                Transfers Overall transfer level: Independent Equipment used: None                  Balance Overall balance assessment: Modified Independent                                         ADL either performed or assessed with clinical judgement   ADL Overall ADL's : Needs assistance/impaired                                       General ADL Comments: pt independent walking and able to bend down to retrieve items.  Educated on energy conservation and handouts given.  Pt also talked about wanting to get walk in shower in place of tub.  Educated on tub bench as a much less expensive option (although this is  generally out of pocket) and pt will look into it.  She is unable to do IADLs, especially vacuuming, like she used to.  Talked to her about getting someone to help her or having husband put chair in room and working in small sections from sitting, not necessarily doing whole room in one day.  Pt appreciative of information.     Vision         Perception     Praxis      Pertinent Vitals/Pain Pain Assessment: 0-10 Pain Score: 2  Pain Location: skin breakdown from diarrhea Pain Intervention(s): Monitored during session     Hand Dominance     Extremity/Trunk Assessment Upper Extremity Assessment Upper Extremity Assessment: Generalized weakness(pt felt hands were weak; good grip strength)  Pt has given her a level 2 theraband HEP. Pt reports tremor is new. Educated to rest arms on table during self feeding to decrease amount of tremor   Lower Extremity Assessment Lower Extremity Assessment: Generalized weakness       Communication Communication Communication: No difficulties   Cognition Arousal/Alertness: Awake/alert Behavior During Therapy: WFL for tasks  assessed/performed Overall Cognitive Status: Within Functional Limits for tasks assessed                                     General Comments       Exercises    Shoulder Instructions      Home Living Family/patient expects to be discharged to:: Private residence Living Arrangements: Spouse/significant other Available Help at Discharge: Family;Available PRN/intermittently Type of Home: House             Bathroom Shower/Tub: Teacher, early years/pre: Standard     Home Equipment: None   Additional Comments: pt is home by herself during the day.  She doesn't shower unless someone is in the house      Prior Functioning/Environment Level of Independence: Independent        Comments: I like to stay moving and stay strong        OT Problem List:        OT  Treatment/Interventions:      OT Goals(Current goals can be found in the care plan section) Acute Rehab OT Goals Patient Stated Goal: I want to get better and go home. I like to keep moving . I don't like these shaking!! She wants to spend time with grandchildren OT Goal Formulation: All assessment and education complete, DC therapy  OT Frequency:     Barriers to D/C:            Co-evaluation              AM-PAC OT "6 Clicks" Daily Activity     Outcome Measure Help from another person eating meals?: None Help from another person taking care of personal grooming?: None Help from another person toileting, which includes using toliet, bedpan, or urinal?: None Help from another person bathing (including washing, rinsing, drying)?: None Help from another person to put on and taking off regular upper body clothing?: None Help from another person to put on and taking off regular lower body clothing?: None 6 Click Score: 24   End of Session    Activity Tolerance: Patient tolerated treatment well Patient left: in bed;with call bell/phone within reach  OT Visit Diagnosis: Muscle weakness (generalized) (M62.81)                Time: 1339-1400 OT Time Calculation (min): 21 min Charges:  OT General Charges $OT Visit: 1 Visit OT Evaluation $OT Eval Low Complexity: 1 Low  Lesle Chris, OTR/L Acute Rehabilitation Services [phone] [facility] pager [phone] office 05/14/2018  [place] 05/14/2018, 2:21 PM

## 2018-05-16 ENCOUNTER — Other Ambulatory Visit: Payer: Self-pay

## 2018-05-16 ENCOUNTER — Other Ambulatory Visit: Payer: Self-pay | Admitting: Physician Assistant

## 2018-05-16 ENCOUNTER — Telehealth: Payer: Self-pay | Admitting: Internal Medicine

## 2018-05-16 ENCOUNTER — Inpatient Hospital Stay: Payer: Managed Care, Other (non HMO)

## 2018-05-16 DIAGNOSIS — R531 Weakness: Secondary | ICD-10-CM | POA: Diagnosis not present

## 2018-05-16 DIAGNOSIS — Z955 Presence of coronary angioplasty implant and graft: Secondary | ICD-10-CM | POA: Diagnosis not present

## 2018-05-16 DIAGNOSIS — Z7689 Persons encountering health services in other specified circumstances: Secondary | ICD-10-CM | POA: Diagnosis not present

## 2018-05-16 DIAGNOSIS — Z5111 Encounter for antineoplastic chemotherapy: Secondary | ICD-10-CM | POA: Diagnosis not present

## 2018-05-16 DIAGNOSIS — E785 Hyperlipidemia, unspecified: Secondary | ICD-10-CM | POA: Diagnosis not present

## 2018-05-16 DIAGNOSIS — Z7902 Long term (current) use of antithrombotics/antiplatelets: Secondary | ICD-10-CM | POA: Diagnosis not present

## 2018-05-16 DIAGNOSIS — C3492 Malignant neoplasm of unspecified part of left bronchus or lung: Secondary | ICD-10-CM | POA: Diagnosis present

## 2018-05-16 DIAGNOSIS — M1990 Unspecified osteoarthritis, unspecified site: Secondary | ICD-10-CM | POA: Diagnosis not present

## 2018-05-16 DIAGNOSIS — Z7982 Long term (current) use of aspirin: Secondary | ICD-10-CM | POA: Diagnosis not present

## 2018-05-16 DIAGNOSIS — R110 Nausea: Secondary | ICD-10-CM | POA: Diagnosis not present

## 2018-05-16 DIAGNOSIS — K219 Gastro-esophageal reflux disease without esophagitis: Secondary | ICD-10-CM | POA: Diagnosis not present

## 2018-05-16 DIAGNOSIS — R5383 Other fatigue: Secondary | ICD-10-CM | POA: Diagnosis not present

## 2018-05-16 DIAGNOSIS — Z79899 Other long term (current) drug therapy: Secondary | ICD-10-CM | POA: Diagnosis not present

## 2018-05-16 DIAGNOSIS — Z5112 Encounter for antineoplastic immunotherapy: Secondary | ICD-10-CM | POA: Diagnosis not present

## 2018-05-16 DIAGNOSIS — I2510 Atherosclerotic heart disease of native coronary artery without angina pectoris: Secondary | ICD-10-CM | POA: Diagnosis not present

## 2018-05-16 LAB — CMP (CANCER CENTER ONLY)
ALT: 41 U/L (ref 0–44)
AST: 23 U/L (ref 15–41)
Albumin: 3.7 g/dL (ref 3.5–5.0)
Alkaline Phosphatase: 110 U/L (ref 38–126)
Anion gap: 11 (ref 5–15)
BUN: 9 mg/dL (ref 8–23)
CHLORIDE: 103 mmol/L (ref 98–111)
CO2: 26 mmol/L (ref 22–32)
Calcium: 8.4 mg/dL — ABNORMAL LOW (ref 8.9–10.3)
Creatinine: 0.92 mg/dL (ref 0.44–1.00)
GFR, Est AFR Am: >60 mL/min (ref >60)
Glucose, Bld: 85 mg/dL (ref 70–99)
Potassium: 3.9 mmol/L (ref 3.5–5.1)
Sodium: 140 mmol/L (ref 135–145)
Total Bilirubin: 0.5 mg/dL (ref 0.3–1.2)
Total Protein: 6.9 g/dL (ref 6.5–8.1)

## 2018-05-16 LAB — CBC WITH DIFFERENTIAL (CANCER CENTER ONLY)
Abs Immature Granulocytes: 0 K/uL (ref 0.00–0.07)
Basophils Absolute: 0 K/uL (ref 0.0–0.1)
Basophils Relative: 0 %
Eosinophils Absolute: 0.4 K/uL (ref 0.0–0.5)
Eosinophils Relative: 12 %
HCT: 30.2 % — ABNORMAL LOW (ref 36.0–46.0)
Hemoglobin: 10.1 g/dL — ABNORMAL LOW (ref 12.0–15.0)
Immature Granulocytes: 0 %
Lymphocytes Relative: 66 %
Lymphs Abs: 1.9 K/uL (ref 0.7–4.0)
MCH: 29.3 pg (ref 26.0–34.0)
MCHC: 33.4 g/dL (ref 30.0–36.0)
MCV: 87.5 fL (ref 80.0–100.0)
Monocytes Absolute: 0.4 K/uL (ref 0.1–1.0)
Monocytes Relative: 12 %
Neutro Abs: 0.3 K/uL — CL (ref 1.7–7.7)
Neutrophils Relative %: 10 %
Platelet Count: 59 K/uL — ABNORMAL LOW (ref 150–400)
RBC: 3.45 MIL/uL — ABNORMAL LOW (ref 3.87–5.11)
RDW: 13.1 % (ref 11.5–15.5)
WBC Count: 3.2 K/uL — ABNORMAL LOW (ref 4.0–10.5)
nRBC: 0 % (ref 0.0–0.2)

## 2018-05-16 NOTE — Telephone Encounter (Signed)
Scheduled appt per 3/16 sch message- pt aware of apt

## 2018-05-16 NOTE — Progress Notes (Signed)
Critical ANC of 0.3 taken to [facility] and she is aware. Patient was never present here except for the lab. When this was called to me patient was already gone.

## 2018-05-17 ENCOUNTER — Telehealth: Payer: Self-pay

## 2018-05-17 NOTE — Telephone Encounter (Signed)
Covid-19 travel screening questions  Have you traveled in the last 14 days? No If yes where?  Do you now or have you had a fever in the last 14 days? No Do you have any respiratory symptoms of shortness of breath or cough now or in the last 14 days? No Do you have a medical history of Congestive Heart Failure?  Do you have a medical history of lung disease?  Do you have any family members or close contacts with diagnosed or suspected Covid-19? No

## 2018-05-18 ENCOUNTER — Inpatient Hospital Stay: Payer: Managed Care, Other (non HMO)

## 2018-05-18 ENCOUNTER — Ambulatory Visit: Payer: Managed Care, Other (non HMO) | Admitting: Physician Assistant

## 2018-05-18 ENCOUNTER — Other Ambulatory Visit: Payer: Self-pay

## 2018-05-18 ENCOUNTER — Encounter: Payer: Self-pay | Admitting: Physician Assistant

## 2018-05-18 VITALS — BP 90/60 | HR 60 | Temp 98.8°F | Ht 66.14 in | Wt 158.1 lb

## 2018-05-18 DIAGNOSIS — R1310 Dysphagia, unspecified: Secondary | ICD-10-CM

## 2018-05-18 DIAGNOSIS — C3490 Malignant neoplasm of unspecified part of unspecified bronchus or lung: Secondary | ICD-10-CM | POA: Diagnosis not present

## 2018-05-18 DIAGNOSIS — C3492 Malignant neoplasm of unspecified part of left bronchus or lung: Secondary | ICD-10-CM

## 2018-05-18 DIAGNOSIS — K648 Other hemorrhoids: Secondary | ICD-10-CM | POA: Diagnosis not present

## 2018-05-18 DIAGNOSIS — Z95828 Presence of other vascular implants and grafts: Secondary | ICD-10-CM

## 2018-05-18 DIAGNOSIS — T17308A Unspecified foreign body in larynx causing other injury, initial encounter: Secondary | ICD-10-CM

## 2018-05-18 MED ORDER — HYDROCORTISONE ACETATE 25 MG RE SUPP: 25.0000 mg | Freq: Every day | RECTAL | 1 refills | Status: DC

## 2018-05-18 MED ORDER — OMEPRAZOLE 40 MG PO CPDR: DELAYED_RELEASE_CAPSULE | ORAL | 8 refills | Status: DC

## 2018-05-18 MED ORDER — FILGRASTIM-AAFI 480 MCG/0.8ML IJ SOSY
300.0000 ug | PREFILLED_SYRINGE | Freq: Once | INTRAMUSCULAR | Status: AC
  Administered 2018-05-18: 300 ug via SUBCUTANEOUS
  Filled 2018-05-18: qty 0.5

## 2018-05-18 NOTE — Patient Instructions (Addendum)
To help prevent the possible spread of infection to our patients, communities, and staff; we will be implementing the following measures:  Please only allow one visitor/family member to accompany you to any upcoming appointments with [facility]. If you have any concerns about this please contact our office to discuss prior to the appointment.   If you are age 64 or older, your body mass index should be between 23-30. Your Body mass index is 25.41 kg/m. If this is out of the aforementioned range listed, please consider follow up with your Primary Care Provider.  If you are age 71 or younger, your body mass index should be between 19-25. Your Body mass index is 25.41 kg/m. If this is out of the aformentioned range listed, please consider follow up with your Primary Care Provider.   You have been scheduled for a Barium Esophogram at [facility] (1st floor of the hospital) on Wednesday, 3-25 at 9:30am. Please arrive 15 minutes prior to your appointment for registration. Make certain not to have anything to eat or drink 3 hours prior to your test (6:30am). If you need to reschedule for any reason, please contact radiology at [phone] to do so. __________________________________________________________ A barium swallow is an examination that concentrates on views of the esophagus. This tends to be a double contrast exam (barium and two liquids which, when combined, create a gas to distend the wall of the oesophagus) or single contrast (non-ionic iodine based). The study is usually tailored to your symptoms so a good history is essential. Attention is paid during the study to the form, structure and configuration of the esophagus, looking for functional disorders (such as aspiration, dysphagia, achalasia, motility and reflux) EXAMINATION You may be asked to change into a gown, depending on the type of swallow being performed. A radiologist and radiographer will perform the procedure.  The radiologist will advise you of the type of contrast selected for your procedure and direct you during the exam. You will be asked to stand, sit or lie in several different positions and to hold a small amount of fluid in your mouth before being asked to swallow while the imaging is performed .In some instances you may be asked to swallow barium coated marshmallows to assess the motility of a solid food bolus. The exam can be recorded as a digital or video fluoroscopy procedure. POST PROCEDURE It will take 1-2 days for the barium to pass through your system. To facilitate this, it is important, unless otherwise directed, to increase your fluids for the next 24-48hrs and to resume your normal diet.  This test typically takes about 30 minutes to perform. ____________________________________________________________   Dennis Bast can do sitz baths as needed.  We are giving you some RecticCare cream you can use for your hemorrhoids and a coupon today.  We have sent the following medications to your pharmacy for you to pick up at your convenience: Omeprazole 35m: take twice a day for 2 weeks, then every morning thereafter  Anusol Suppositories: Use every night at bedtime for one week and then as needed for hemorrhoids.   Thank you for entrusting me with your care and [facility]. SCarolina Cellar

## 2018-05-18 NOTE — Progress Notes (Signed)
Order for Granix changed to [place] due to pt insurance pref agent. Today pharmacy only has Nivestym 480 mcg/0.8 mL product available for use. Dose is 300 mcg daily x 3 days.  Today we will use 0.5 mL of the 480 mcg inj = 300 mcg We have ordered Nivestym 300 mcg for subsequent doses. Kennith Center, Pharm.D., CPP 05/18/2018@11 :36 AM

## 2018-05-18 NOTE — Patient Instructions (Signed)
Filgrastim, G-CSF injection What is this medicine? FILGRASTIM, G-CSF (fil GRA stim) is a granulocyte colony-stimulating factor that stimulates the growth of neutrophils, a type of white blood cell (WBC) important in the body's fight against infection. It is used to reduce the incidence of fever and infection in patients with certain types of cancer who are receiving chemotherapy that affects the bone marrow, to stimulate blood cell production for removal of WBCs from the body prior to a bone marrow transplantation, to reduce the incidence of fever and infection in patients who have severe chronic neutropenia, and to improve survival outcomes following high-dose radiation exposure that is toxic to the bone marrow. This medicine may be used for other purposes; ask your health care provider or pharmacist if you have questions. COMMON BRAND NAME(S): Neupogen, Nivestym, Zarxio What should I tell my health care provider before I take this medicine? They need to know if you have any of these conditions: -kidney disease -latex allergy -ongoing radiation therapy -sickle cell disease -an unusual or allergic reaction to filgrastim, pegfilgrastim, other medicines, foods, dyes, or preservatives -pregnant or trying to get pregnant -breast-feeding How should I use this medicine? This medicine is for injection under the skin or infusion into a vein. As an infusion into a vein, it is usually given by a health care professional in a hospital or clinic setting. If you get this medicine at home, you will be taught how to prepare and give this medicine. Refer to the Instructions for Use that come with your medication packaging. Use exactly as directed. Take your medicine at regular intervals. Do not take your medicine more often than directed. It is important that you put your used needles and syringes in a special sharps container. Do not put them in a trash can. If you do not have a sharps container, call your  pharmacist or healthcare provider to get one. Talk to your pediatrician regarding the use of this medicine in children. While this drug may be prescribed for children as young as 7 months for selected conditions, precautions do apply. Overdosage: If you think you have taken too much of this medicine contact a poison control center or emergency room at once. NOTE: This medicine is only for you. Do not share this medicine with others. What if I miss a dose? It is important not to miss your dose. Call your doctor or health care professional if you miss a dose. What may interact with this medicine? This medicine may interact with the following medications: -medicines that may cause a release of neutrophils, such as lithium This list may not describe all possible interactions. Give your health care provider a list of all the medicines, herbs, non-prescription drugs, or dietary supplements you use. Also tell them if you smoke, drink alcohol, or use illegal drugs. Some items may interact with your medicine. What should I watch for while using this medicine? You may need blood work done while you are taking this medicine. What side effects may I notice from receiving this medicine? Side effects that you should report to your doctor or health care professional as soon as possible: -allergic reactions like skin rash, itching or hives, swelling of the face, lips, or tongue -back pain -dizziness or feeling faint -fever -pain, redness, or irritation at site where injected -pinpoint red spots on the skin -shortness of breath or breathing problems -signs and symptoms of kidney injury like trouble passing urine, change in the amount of urine, or red or dark-brown urine -stomach  or side pain, or pain at the shoulder -swelling -tiredness -unusual bleeding or bruising Side effects that usually do not require medical attention (report to your doctor or health care professional if they continue or are  bothersome): -bone pain -cough -diarrhea -hair loss -headache -muscle pain This list may not describe all possible side effects. Call your doctor for medical advice about side effects. You may report side effects to FDA at 1-800-FDA-1088. Where should I keep my medicine? Keep out of the reach of children. Store in a refrigerator between 2 and 8 degrees C (36 and 46 degrees F). Do not freeze. Keep in carton to protect from light. Throw away this medicine if vials or syringes are left out of the refrigerator for more than 24 hours. Throw away any unused medicine after the expiration date. NOTE: This sheet is a summary. It may not cover all possible information. If you have questions about this medicine, talk to your doctor, pharmacist, or health care provider.  2019 [employer] Standard (2017-10-01 15:39:45)

## 2018-05-19 ENCOUNTER — Encounter: Payer: Self-pay | Admitting: Physician Assistant

## 2018-05-19 ENCOUNTER — Inpatient Hospital Stay: Payer: Managed Care, Other (non HMO)

## 2018-05-19 ENCOUNTER — Other Ambulatory Visit: Payer: Self-pay

## 2018-05-19 VITALS — BP 104/65 | HR 81 | Temp 99.0°F

## 2018-05-19 DIAGNOSIS — C3492 Malignant neoplasm of unspecified part of left bronchus or lung: Secondary | ICD-10-CM | POA: Diagnosis not present

## 2018-05-19 DIAGNOSIS — Z95828 Presence of other vascular implants and grafts: Secondary | ICD-10-CM

## 2018-05-19 MED ORDER — FILGRASTIM-AAFI 300 MCG/0.5ML IJ SOSY
300.0000 ug | PREFILLED_SYRINGE | Freq: Once | INTRAMUSCULAR | Status: AC
  Administered 2018-05-19: 300 ug via SUBCUTANEOUS
  Filled 2018-05-19: qty 0.5

## 2018-05-19 NOTE — Progress Notes (Signed)
Subjective:    Patient ID: [name], female    DOB: November 16, 1955, 63 y.o.   MRN: [id number]  HPI [name] is a very nice 63 year old white female, new to GI today, self-referred with complaints of intermittent dysphasia, chronic GERD and intermittent coughing/choking spells.  She has also had recent hemorrhoidal symptoms with pain and some bleeding. Patient is previously known to Dr. Earlean Shawl and says she has had prior colonoscopies and endoscopies with Dr. Earlean Shawl.  She thinks the last was 4 to 5 years ago. Patient was diagnosed with stage IV adenocarcinoma of the lung in December 2019.  She has multifocal disease in both lungs.  She is undergoing chemotherapy every 3 weeks with her first round on February 11.  She was hospitalized on 05/10/2018 after her second round of chemo with severe diarrhea nausea vomiting and abdominal pain.  She underwent CT of the abdomen and pelvis which was negative.  She was dehydrated with acute kidney injury on admission.  GI pathogen panel was negative as well as C. difficile PCR.  It was felt most likely her symptoms were secondary to chemo. She is anticipating another round of chemo to be given next week.  Her most recent labs on 05/16/2018 showed a WBC of 3.2, hemoglobin 10.1 hematocrit 30.2 platelets of 59,000 and absolute neutrophils of 0.3.  She is actually scheduled later today to go for a Granix infusion. She says that her severe gastroenteritis symptoms have resolved, she is able to eat at this point no appetite not very good and is no longer having any vomiting.  Her diarrhea has resolved.  She says she has had some hemorrhoid symptoms off and on for years at one point was to be scheduled for a banding procedure with Dr. Earlean Shawl.  Since she had all of the diarrhea her hemorrhoids came very symptomatic.  She thinks she has internal hemorrhoids that prolapse out and can be very uncomfortable.  She has been doing sitz bath's at home which has been helping.  Not seeing  any blood currently. Other complaint is of intermittent dysphasia.  She says she has long history of chronic GERD and has been on Protonix in the past which caused diarrhea.  More recently she has been using over-the-counter acid products which are not working very well.  She says she has had a lot of acid reflux over the past few weeks.  She also has a sense of food sticking intermittently in the subxiphoid area and also will have the sensation with pills.  This is been going on for multiple months. Has occasional episodes of choking.  She says she swallows and feels like a piece of what ever she has eaten goes down the wrong way and she will have to have a lot of hard coughing in order to bring this back up.  Those symptoms have been present for 6 months.     Review of Systems Pertinent positive and negative review of systems were noted in the above HPI section.  All other review of systems was otherwise negative.  Outpatient Encounter Medications as of 05/18/2018  Medication Sig  . albuterol (PROAIR HFA) 108 (90 Base) MCG/ACT inhaler Inhale 1 puff into the lungs every 6 (six) hours as needed (wheezing/shortness of breath.).   Marland Kitchen albuterol (PROVENTIL) (5 MG/ML) 0.5% nebulizer solution Take 2.5 mg by nebulization every 6 (six) hours as needed for wheezing.  Marland Kitchen amLODipine (NORVASC) 5 MG tablet Take 1.5 tablets (7.5 mg total) by mouth daily. (Patient  taking differently: Take 7.5 mg by mouth every evening. )  . aspirin 81 MG chewable tablet Chew 1 tablet (81 mg total) by mouth daily.  . BELSOMRA 20 MG TABS Take 20 mg by mouth at bedtime as needed for sleep.  Marland Kitchen buPROPion (WELLBUTRIN SR) 150 MG 12 hr tablet Take 150-300 mg by mouth See admin instructions. Take 2 tablets (300 mg) by mouth daily in the morning & take 1 tablet (150 mg) by mouth in the evening.  . DULoxetine (CYMBALTA) 60 MG capsule Take 120 mg by mouth daily.  . ergocalciferol (VITAMIN D2) 50000 UNITS capsule Take 50,000 Units by mouth every  Wednesday.   . folic acid (FOLVITE) 1 MG tablet Take 1 tablet (1 mg total) by mouth daily.  Marland Kitchen gabapentin (NEURONTIN) 100 MG capsule Take 100 mg by mouth daily.   Marland Kitchen HYDROcodone-acetaminophen (NORCO) 7.5-325 MG per tablet Take 1 tablet by mouth every 6 (six) hours as needed for pain.  Marland Kitchen Hydrocortisone (GERHARDT'S BUTT CREAM) CREA Apply 1 application topically 4 (four) times daily.  Marland Kitchen levothyroxine (SYNTHROID, LEVOTHROID) 25 MCG tablet Take 25 mcg by mouth daily before breakfast.  . lidocaine-prilocaine (EMLA) cream Apply 1 application topically as needed.  . loperamide (IMODIUM) 2 MG capsule Take 1 capsule (2 mg total) by mouth as needed for diarrhea or loose stools (Initiate if stool C diff -ve).  . LORazepam (ATIVAN) 2 MG tablet Take 2 mg by mouth See admin instructions. Take 1 tablet (2 mg) by mouth scheduled at bedtime & take 1 tablet (2 mg) by mouth twice daily if needed for anxiety  . metoprolol tartrate (LOPRESSOR) 25 MG tablet Take 1 tablet (25 mg total) by mouth 2 (two) times daily.  . montelukast (SINGULAIR) 10 MG tablet Take 10 mg by mouth at bedtime.   . nitroGLYCERIN (NITROSTAT) 0.4 MG SL tablet Place 1 tablet (0.4 mg total) under the tongue every 5 (five) minutes as needed for chest pain.  Marland Kitchen prochlorperazine (COMPAZINE) 10 MG tablet TAKE 1 TABLET(10 MG) BY MOUTH EVERY 6 HOURS AS NEEDED FOR NAUSEA OR VOMITING  . SYMBICORT 160-4.5 MCG/ACT inhaler Take 2 puffs by mouth 2 (two) times daily.  . temazepam (RESTORIL) 15 MG capsule Take 15 mg by mouth at bedtime.   . vitamin B-12 100 MCG tablet Take 1 tablet (100 mcg total) by mouth daily.  . [DISCONTINUED] pantoprazole (PROTONIX) 40 MG tablet Take 40 mg by mouth daily.   . clopidogrel (PLAVIX) 75 MG tablet Take 1 tablet (75 mg total) by mouth daily. MUST SCHEDULE APPOINTMENT FOR FURTHER REFILLS (Patient not taking: Reported on 05/18/2018)  . hydrocortisone (ANUSOL-HC) 25 MG suppository Place 1 suppository (25 mg total) rectally at bedtime. For  one week and then as needed for hemorrhoids  . omeprazole (PRILOSEC) 40 MG capsule Take 1 tablet by mouth twice a day for 2 weeks then 1 tablet every morning thereafter   No facility-administered encounter medications on file as of 05/18/2018.    Allergies  Allergen Reactions  . Ciprofloxacin Hives  . Levofloxacin     UNSPECIFIED REACTION, BUT LIKELY HIVES > HIVES WITH CIPRO  . Statins Other (See Comments)    Liver enzymes Liver enzymes  . Tricor [Fenofibrate] Hives and Itching  . Latex Other (See Comments)    Patient states "Messes with my skin."  . Codeine Nausea And Vomiting  . Metoprolol Other (See Comments)    Does not tolerate beta blockers 10/02/2013-pt states she can tolerate 25 mg and lower  Patient Active Problem List   Diagnosis Date Noted  . Nausea and vomiting 05/11/2018  . AKI (acute kidney injury) ([place]) 05/11/2018  . Port-A-Cath in place 05/02/2018  . Adenocarcinoma of left lung, stage 4 ([place]) 04/05/2018  . Encounter for antineoplastic chemotherapy 04/05/2018  . Encounter for antineoplastic immunotherapy 04/05/2018  . Goals of care, counseling/discussion 04/05/2018  . Tobacco abuse counseling 03/05/2018  . PVC's (premature ventricular contractions)   . SVT (supraventricular tachycardia) ([place])   . Premature atrial contractions   . Unstable angina ([place]) 12/07/2012  . Tachycardia 02/19/2009  . CAD, NATIVE VESSEL 06/04/2008  . Hyperlipemia 05/29/2008  . DEPRESSION 05/29/2008  . ASTHMA 05/29/2008  . GERD 05/29/2008  . Irritable bowel syndrome 05/29/2008  . DEGENERATIVE JOINT DISEASE, LUMBAR SPINE 05/29/2008  . FIBROMYALGIA 05/29/2008  . CHEST PAIN-PRECORDIAL 05/07/2008   Social History   Socioeconomic History  . Marital status: Married    Spouse name: Not on file  . Number of children: 4  . Years of education: Not on file  . Highest education level: Not on file  Occupational History  . Not on file  Social Needs  . Financial resource strain: Not on  file  . Food insecurity:    Worry: Not on file    Inability: Not on file  . Transportation needs:    Medical: Not on file    Non-medical: Not on file  Tobacco Use  . Smoking status: Former Smoker    Packs/day: 0.50    Years: 30.00    Pack years: 15.00    Types: Cigarettes    Last attempt to quit: 2009    Years since quitting: 11.2  . Smokeless tobacco: Never Used  . Tobacco comment: 12/06/2012 "quit smoking cigarettes ~ 8 yr ago"  Substance and Sexual Activity  . Alcohol use: No  . Drug use: No  . Sexual activity: Yes  Lifestyle  . Physical activity:    Days per week: Not on file    Minutes per session: Not on file  . Stress: Not on file  Relationships  . Social connections:    Talks on phone: Not on file    Gets together: Not on file    Attends religious service: Not on file    Active member of club or organization: Not on file    Attends meetings of clubs or organizations: Not on file    Relationship status: Not on file  . Intimate partner violence:    Fear of current or ex partner: Not on file    Emotionally abused: Not on file    Physically abused: Not on file    Forced sexual activity: Not on file  Other Topics Concern  . Not on file  Social History Narrative  . Not on file    Ms. Udovich's family history includes Celiac disease in her grandson and son; Colon cancer (age of onset: 81) in her father; Diabetes in her granddaughter; Down syndrome in her grandson; Drug abuse in her brother and brother; Emphysema in her mother; Hepatitis C in her brother; Lung disease in her father.      Objective:    Vitals:   05/18/18 1312  BP: 90/60  Pulse: 60  Temp: 98.8 F (37.1 C)    Physical Exam; well-developed chronically ill-appearing white female in no acute distress, very pleasant, height 5 foot 6, weight 158, BMI 25.4.  HEENT; nontraumatic normocephalic EOMI PERRLA sclera anicteric, oral mucosa moist, Cardiovascular; regular rate and rhythm with S1-S2, Pulmonar,  decreased breath sounds bilaterally, Abdomen; soft, nondistended, nontender, no palpable mass or hepatosplenomegaly, bowel sounds are present, Rectal ;exam no external hemorrhoids noted, she is not significantly tender on digital exam, stool is heme negative, she has palpable small to medium sized internal hemorrhoids, no mass.  Extremities; no clubbing cyanosis or edema skin warm and dry, Neuropsych; alert and oriented, grossly nonfocal mood and affect appropriate       Assessment & Plan:   #27 63 year old female with stage IV adenocarcinoma of the lung diagnosed December 2019 and currently undergoing chemotherapy every 3 weeks who presents with complaints of intermittent solid food dysphagia over the past 6 months.  She does have history of chronic GERD. Rule out peptic esophagitis and/or stricture, rule out possible extrinsic compression of the esophagus secondary to lung cancer though no gross adenopathy noted on most recent imaging  #2 intermittent episodes of what sounds like aspiration which results in hard coughing episodes. #3 symptomatic internal hemorrhoids if and improving and exacerbated by recent episode of post chemo gastroenteritis with nausea vomiting and diarrhea for which she was hospitalized a week ago. #4.  Pancytopenia-secondary to chemotherapy #5 history of coronary artery disease with previous stents, she had been on aspirin and Plavix both on hold currently #6 fibromyalgia #7 colon cancer surveillance-patient has had prior colonoscopies will obtain reports  Plan; Start trial of omeprazole 40 mg p.o. twice daily x2 weeks, then if improved decrease to once daily in the morning AC breakfast. Like to avoid procedures given her pancytopenia secondary to chemotherapy.  Will schedule for barium swallow with tablet.  This will be done next week.  Depending on results decision can be made regarding for EGD. Plavix and aspirin are currently on hold. For hemorrhoidal symptoms she  will continue sitz bath's as needed, advise she get RectiCare or  RectiCare complete and use 4-5 times daily as needed for discomfort.  She will continue to use Tucks pads, and will send prescription for Anusol HC suppositories to use at bedtime x1 week then as needed. Patient mentioned hemorrhoidal banding which is not appropriate currently while she is undergoing chemotherapy. Patient has signed a release and will get her previous records from Dr. Earlie Raveling. Patient will be established with Dr. Loletha Carrow.   S  PA-C 05/19/2018   Cc: [name], NP

## 2018-05-19 NOTE — Patient Instructions (Signed)
Filgrastim, G-CSF injection What is this medicine? FILGRASTIM, G-CSF (fil GRA stim) is a granulocyte colony-stimulating factor that stimulates the growth of neutrophils, a type of white blood cell (WBC) important in the body's fight against infection. It is used to reduce the incidence of fever and infection in patients with certain types of cancer who are receiving chemotherapy that affects the bone marrow, to stimulate blood cell production for removal of WBCs from the body prior to a bone marrow transplantation, to reduce the incidence of fever and infection in patients who have severe chronic neutropenia, and to improve survival outcomes following high-dose radiation exposure that is toxic to the bone marrow. This medicine may be used for other purposes; ask your health care provider or pharmacist if you have questions. COMMON BRAND NAME(S): Neupogen, Nivestym, Zarxio What should I tell my health care provider before I take this medicine? They need to know if you have any of these conditions: -kidney disease -latex allergy -ongoing radiation therapy -sickle cell disease -an unusual or allergic reaction to filgrastim, pegfilgrastim, other medicines, foods, dyes, or preservatives -pregnant or trying to get pregnant -breast-feeding How should I use this medicine? This medicine is for injection under the skin or infusion into a vein. As an infusion into a vein, it is usually given by a health care professional in a hospital or clinic setting. If you get this medicine at home, you will be taught how to prepare and give this medicine. Refer to the Instructions for Use that come with your medication packaging. Use exactly as directed. Take your medicine at regular intervals. Do not take your medicine more often than directed. It is important that you put your used needles and syringes in a special sharps container. Do not put them in a trash can. If you do not have a sharps container, call your  pharmacist or healthcare provider to get one. Talk to your pediatrician regarding the use of this medicine in children. While this drug may be prescribed for children as young as 7 months for selected conditions, precautions do apply. Overdosage: If you think you have taken too much of this medicine contact a poison control center or emergency room at once. NOTE: This medicine is only for you. Do not share this medicine with others. What if I miss a dose? It is important not to miss your dose. Call your doctor or health care professional if you miss a dose. What may interact with this medicine? This medicine may interact with the following medications: -medicines that may cause a release of neutrophils, such as lithium This list may not describe all possible interactions. Give your health care provider a list of all the medicines, herbs, non-prescription drugs, or dietary supplements you use. Also tell them if you smoke, drink alcohol, or use illegal drugs. Some items may interact with your medicine. What should I watch for while using this medicine? You may need blood work done while you are taking this medicine. What side effects may I notice from receiving this medicine? Side effects that you should report to your doctor or health care professional as soon as possible: -allergic reactions like skin rash, itching or hives, swelling of the face, lips, or tongue -back pain -dizziness or feeling faint -fever -pain, redness, or irritation at site where injected -pinpoint red spots on the skin -shortness of breath or breathing problems -signs and symptoms of kidney injury like trouble passing urine, change in the amount of urine, or red or dark-brown urine -stomach  or side pain, or pain at the shoulder -swelling -tiredness -unusual bleeding or bruising Side effects that usually do not require medical attention (report to your doctor or health care professional if they continue or are  bothersome): -bone pain -cough -diarrhea -hair loss -headache -muscle pain This list may not describe all possible side effects. Call your doctor for medical advice about side effects. You may report side effects to FDA at 1-800-FDA-1088. Where should I keep my medicine? Keep out of the reach of children. Store in a refrigerator between 2 and 8 degrees C (36 and 46 degrees F). Do not freeze. Keep in carton to protect from light. Throw away this medicine if vials or syringes are left out of the refrigerator for more than 24 hours. Throw away any unused medicine after the expiration date. NOTE: This sheet is a summary. It may not cover all possible information. If you have questions about this medicine, talk to your doctor, pharmacist, or health care provider.  2019 [employer] Standard (2017-10-01 15:39:45)

## 2018-05-19 NOTE — Progress Notes (Signed)
____________________________________________________________  Attending physician addendum:  Thank you for sending this case to me. I have reviewed the entire note, and the outlined plan seems appropriate.  Agree with barium study and hopefully avoiding endoscopy in near future due to neutropenia.  Wilfrid Lund, MD  ____________________________________________________________

## 2018-05-20 ENCOUNTER — Inpatient Hospital Stay: Payer: Managed Care, Other (non HMO)

## 2018-05-20 ENCOUNTER — Other Ambulatory Visit: Payer: Self-pay

## 2018-05-20 VITALS — BP 109/70 | HR 76 | Temp 97.7°F | Resp 18

## 2018-05-20 DIAGNOSIS — C3492 Malignant neoplasm of unspecified part of left bronchus or lung: Secondary | ICD-10-CM | POA: Diagnosis not present

## 2018-05-20 DIAGNOSIS — Z95828 Presence of other vascular implants and grafts: Secondary | ICD-10-CM

## 2018-05-20 MED ORDER — FILGRASTIM-AAFI 480 MCG/0.8ML IJ SOSY
300.0000 ug | PREFILLED_SYRINGE | Freq: Once | INTRAMUSCULAR | Status: AC
  Administered 2018-05-20: 300 ug via SUBCUTANEOUS
  Filled 2018-05-20: qty 0.5

## 2018-05-20 NOTE — Discharge Summary (Signed)
Physician Discharge Summary  Diana ONTKO HUD:[id number] DOB: 09-Jul-1955 DOA: 05/10/2018  PCP: [name], NP  Admit date: 05/10/2018 Discharge date: 05/17/2018  Admitted From: Home.  Disposition:  Home  Recommendations for Outpatient Follow-up:  1. Follow up with PCP in 1-2 weeks 2. Please obtain BMP/CBC in one week   Discharge Condition:stable CODE STATUS: Full code.  Diet recommendation: Heart Healthy  Brief/Interim Summary: [name] a 63 y.o.femalewith history h/oCAD, depression, fibromyalgia, GERD, asthma, SVT, recently diagnosed stage IV lung cancer (PET scan per patient showed bilateral lung mets) who follows Dr. Julien Nordmann for oncology and been receiving chemotherapy for the last 6 weeks presents with complaints of nausea vomiting and diarrhea. Patient states her last chemotherapy session was 2 weeks back.   Discharge Diagnoses:  Active Problems:   Nausea and vomiting   AKI (acute kidney injury) ([place])  1.Acute severe gastroenteritis: Viral versus bacterial versus chemotherapy related.improving. Supportive management with antiemetics. CT abdomen unremarkable for colitis or other intra-abdominal abnormalities. Started on clears and advanced as tolerated.   2.AKI: Present on admission, improving. Renal parameters on discharge stable.   3.Stage IV lung cancer: Follow-up primary oncologist upon discharge. Nebulizer treatments PRN  4.Depression/fibromyalgia: Resume home medications  5.SVT: Resume beta-blockers. Pt denies any symptoms.   6.Hypertension: Resume Norvasc at a lower dose given problem #1. Resume beta-blockers   7.CHY:IFOYDX aspirin, Plavix and beta-blockers   8.Hypothyroidism: Synthroid   Anemia of chronic disease.  Anemia panel shows low vitamin B12 levels.  Supplementation ordered.    Patient reports dysarthria. Thick tongue since many days.  No focal deficits.  CT head negative for acute pathology.  Recent MRI brain with and without contrast does not show any metastatic disease.    Discharge Instructions  Discharge Instructions    Diet - low sodium heart healthy   Complete by:  As directed    Discharge instructions   Complete by:  As directed    Follow up with cbc and bmp ON Monday.  Please follow up with PCP in one week.  Please follow up with Dr Julien Nordmann as recommended.     Allergies as of 05/14/2018      Reactions   Ciprofloxacin Hives   Levofloxacin    UNSPECIFIED REACTION, BUT LIKELY HIVES > HIVES WITH CIPRO   Statins Other (See Comments)   Liver enzymes Liver enzymes   Tricor [fenofibrate] Hives, Itching   Latex Other (See Comments)   Patient states "Messes with my skin."   Codeine Nausea And Vomiting   Metoprolol Other (See Comments)   Does not tolerate beta blockers 10/02/2013-pt states she can tolerate 25 mg and lower      Medication List    TAKE these medications   ProAir HFA 108 (90 Base) MCG/ACT inhaler Generic drug:  albuterol Inhale 1 puff into the lungs every 6 (six) hours as needed (wheezing/shortness of breath.).   albuterol (5 MG/ML) 0.5% nebulizer solution Commonly known as:  PROVENTIL Take 2.5 mg by nebulization every 6 (six) hours as needed for wheezing.   amLODipine 5 MG tablet Commonly known as:  NORVASC Take 1.5 tablets (7.5 mg total) by mouth daily. What changed:  when to take this   aspirin 81 MG chewable tablet Chew 1 tablet (81 mg total) by mouth daily.   Belsomra 20 MG Tabs Generic drug:  Suvorexant Take 20 mg by mouth at bedtime as needed for sleep.   buPROPion 150 MG 12 hr tablet Commonly known as:  WELLBUTRIN SR Take 150-300  mg by mouth See admin instructions. Take 2 tablets (300 mg) by mouth daily in the morning & take 1 tablet (150 mg) by mouth in the evening.   clopidogrel 75 MG tablet Commonly known as:  PLAVIX Take 1 tablet (75 mg total) by mouth daily. MUST SCHEDULE APPOINTMENT FOR FURTHER REFILLS   cyanocobalamin  100 MCG tablet Take 1 tablet (100 mcg total) by mouth daily.   DULoxetine 60 MG capsule Commonly known as:  CYMBALTA Take 120 mg by mouth daily.   ergocalciferol 1.25 MG (50000 UT) capsule Commonly known as:  VITAMIN D2 Take 50,000 Units by mouth every Wednesday.   folic acid 1 MG tablet Commonly known as:  FOLVITE Take 1 tablet (1 mg total) by mouth daily.   gabapentin 100 MG capsule Commonly known as:  NEURONTIN Take 100 mg by mouth daily.   Gerhardt's butt cream Crea Apply 1 application topically 4 (four) times daily.   HYDROcodone-acetaminophen 7.5-325 MG tablet Commonly known as:  NORCO Take 1 tablet by mouth every 6 (six) hours as needed for pain.   levothyroxine 25 MCG tablet Commonly known as:  SYNTHROID, LEVOTHROID Take 25 mcg by mouth daily before breakfast.   lidocaine-prilocaine cream Commonly known as:  EMLA Apply 1 application topically as needed.   loperamide 2 MG capsule Commonly known as:  IMODIUM Take 1 capsule (2 mg total) by mouth as needed for diarrhea or loose stools (Initiate if stool C diff -ve).   LORazepam 2 MG tablet Commonly known as:  ATIVAN Take 2 mg by mouth See admin instructions. Take 1 tablet (2 mg) by mouth scheduled at bedtime & take 1 tablet (2 mg) by mouth twice daily if needed for anxiety   metoprolol tartrate 25 MG tablet Commonly known as:  LOPRESSOR Take 1 tablet (25 mg total) by mouth 2 (two) times daily.   montelukast 10 MG tablet Commonly known as:  SINGULAIR Take 10 mg by mouth at bedtime.   nitroGLYCERIN 0.4 MG SL tablet Commonly known as:  Nitrostat Place 1 tablet (0.4 mg total) under the tongue every 5 (five) minutes as needed for chest pain.   prochlorperazine 10 MG tablet Commonly known as:  COMPAZINE TAKE 1 TABLET(10 MG) BY MOUTH EVERY 6 HOURS AS NEEDED FOR NAUSEA OR VOMITING What changed:  See the new instructions.   Symbicort 160-4.5 MCG/ACT inhaler Generic drug:  budesonide-formoterol Take 2 puffs by  mouth 2 (two) times daily.   temazepam 15 MG capsule Commonly known as:  RESTORIL Take 15 mg by mouth at bedtime.      Follow-up Information    [name], NP. Schedule an appointment as soon as possible for a visit in 1 week(s).   Contact information: [place] [address] [phone]        [name], MD .   Specialty:  Cardiology Contact information: [place] [place] [address] [phone]          Allergies  Allergen Reactions  . Ciprofloxacin Hives  . Levofloxacin     UNSPECIFIED REACTION, BUT LIKELY HIVES > HIVES WITH CIPRO  . Statins Other (See Comments)    Liver enzymes Liver enzymes  . Tricor [Fenofibrate] Hives and Itching  . Latex Other (See Comments)    Patient states "Messes with my skin."  . Codeine Nausea And Vomiting  . Metoprolol Other (See Comments)    Does not tolerate beta blockers 10/02/2013-pt states she can tolerate 25 mg and  lower     Consultations:  None.    Procedures/Studies: Ct Head Wo Contrast  Result Date: 05/13/2018 CLINICAL DATA:  TIA.  Weakness and confusion.  Lung cancer. EXAM: CT HEAD WITHOUT CONTRAST TECHNIQUE: Contiguous axial images were obtained from the base of the skull through the vertex without intravenous contrast. COMPARISON:  None. FINDINGS: Brain: Ventricle size and cerebral volume normal for age. Mild changes in the periventricular white matter appear chronic. No acute infarct, hemorrhage, mass. No edema. Vascular: Negative for hyperdense vessel Skull: Negative Sinuses/Orbits: Negative Other: None IMPRESSION: No acute intracranial abnormality. Mild chronic white matter changes most likely due to chronic microvascular ischemia If there is concern of metastatic lung cancer, MRI of the brain without and with contrast recommended. If the patient could not have MRI, postcontrast CT recommended as an alternative. Electronically Signed   By: Franchot Gallo  M.D.   On: 05/13/2018 11:21   Ct Abdomen Pelvis W Contrast  Result Date: 05/11/2018 CLINICAL DATA:  Nausea, vomiting, diarrhea. Ongoing chemotherapy for lung cancer. MEDICATIONS not seen. EXAM: CT ABDOMEN AND PELVIS WITH CONTRAST TECHNIQUE: Multidetector CT imaging of the abdomen and pelvis was performed using the standard protocol following bolus administration of intravenous contrast. CONTRAST:  72m ISOVUE-300 IOPAMIDOL (ISOVUE-300) INJECTION 61% COMPARISON:  CT abdomen and pelvis 12/08/2012. PET-CT 02/03/2018 FINDINGS: Lower chest: Lung bases are clear. Hepatobiliary: No focal liver abnormality is seen. Status post cholecystectomy. No biliary dilatation. Pancreas: Unremarkable. No pancreatic ductal dilatation or surrounding inflammatory changes. Spleen: Normal in size without focal abnormality. Adrenals/Urinary Tract: Adrenal glands are unremarkable. Kidneys are normal, without renal calculi, focal lesion, or hydronephrosis. Bladder is unremarkable. Stomach/Bowel: Stomach, small bowel, and colon are not abnormally distended. No wall thickening or inflammatory changes appreciated. Appendix is normal. Vascular/Lymphatic: Aortic atherosclerosis. No enlarged abdominal or pelvic lymph nodes. Reproductive: Status post hysterectomy. No adnexal masses. Linear metallic density or suture in the low pelvis, unchanged since 2014 study. Other: No free air or free fluid in the abdomen. Abdominal wall musculature appears intact. Musculoskeletal: No acute or significant osseous findings. IMPRESSION: No acute process demonstrated in the abdomen or pelvis. No evidence of bowel obstruction or inflammation. Aortic Atherosclerosis (ICD10-I70.0). Electronically Signed   By: WLucienne CapersM.D.   On: 05/11/2018 00:49   Ir Imaging Guided Port Insertion  Result Date: 04/29/2018 INDICATION: 63year old female with stage IV left lung adenocarcinoma. She presents for durable venous access for chemotherapy. EXAM: IMPLANTED PORT A  CATH PLACEMENT WITH ULTRASOUND AND FLUOROSCOPIC GUIDANCE MEDICATIONS: 2 g Ancef; The antibiotic was administered within an appropriate time interval prior to skin puncture. ANESTHESIA/SEDATION: Versed 4 mg IV; Fentanyl 100 mcg IV; Moderate Sedation Time:  20 minutes The patient was continuously monitored during the procedure by the interventional radiology nurse under my direct supervision. FLUOROSCOPY TIME:  0 minutes, 18 seconds (4 mGy) COMPLICATIONS: None immediate. PROCEDURE: The right neck and chest was prepped with chlorhexidine, and draped in the usual sterile fashion using maximum barrier technique (cap and mask, sterile gown, sterile gloves, large sterile sheet, hand hygiene and cutaneous antiseptic). Local anesthesia was attained by infiltration with 1% lidocaine with epinephrine. Ultrasound demonstrated patency of the right internal jugular vein, and this was documented with an image. Under real-time ultrasound guidance, this vein was accessed with a 21 gauge micropuncture needle and image documentation was performed. A small dermatotomy was made at the access site with an 11 scalpel. A 0.018" wire was advanced into the SVC and the access needle exchanged for a 42F  micropuncture vascular sheath. The 0.018" wire was then removed and a 0.035" wire advanced into the IVC. An appropriate location for the subcutaneous reservoir was selected below the clavicle and an incision was made through the skin and underlying soft tissues. The subcutaneous tissues were then dissected using a combination of blunt and sharp surgical technique and a pocket was formed. A single lumen power injectable portacatheter was then tunneled through the subcutaneous tissues from the pocket to the dermatotomy and the port reservoir placed within the subcutaneous pocket. The venous access site was then serially dilated and a peel away vascular sheath placed over the wire. The wire was removed and the port catheter advanced into position  under fluoroscopic guidance. The catheter tip is positioned in the superior cavoatrial junction. This was documented with a spot image. The portacatheter was then tested and found to flush and aspirate well. The port was flushed with saline followed by 100 units/mL heparinized saline. The pocket was then closed in two layers using first subdermal inverted interrupted absorbable sutures followed by a running subcuticular suture. The epidermis was then sealed with Dermabond. The dermatotomy at the venous access site was also closed with Dermabond. IMPRESSION: Successful placement of a right IJ approach Power Port with ultrasound and fluoroscopic guidance. The catheter is ready for use. Electronically Signed   By: Jacqulynn Cadet M.D.   On: 04/29/2018 15:22       Subjective:  No chest pain. No nausea, vomiting or diarrhea.  Discharge Exam: Vitals:   05/13/18 2206 05/14/18 0520  BP: 121/68 117/71  Pulse: 79 73  Resp: 20 20  Temp: 98.6 F (37 C) 98.1 F (36.7 C)  SpO2: 97% 99%   Vitals:   05/13/18 1002 05/13/18 1300 05/13/18 2206 05/14/18 0520  BP: 111/75 124/76 121/68 117/71  Pulse: 70 75 79 73  Resp:  (!) 21 20 20   Temp:  98.3 F (36.8 C) 98.6 F (37 C) 98.1 F (36.7 C)  TempSrc:  Oral Oral Oral  SpO2:  98% 97% 99%  Weight:      Height:        General: Pt is alert, awake, not in acute distress Cardiovascular: RRR, S1/S2 +, no rubs, no gallops Respiratory: CTA bilaterally, no wheezing, no rhonchi Abdominal: Soft, NT, ND, bowel sounds + Extremities: no edema, no cyanosis    The results of significant diagnostics from this hospitalization (including imaging, microbiology, ancillary and laboratory) are listed below for reference.     Microbiology: Recent Results (from the past 240 hour(s))  Gastrointestinal Panel by PCR , Stool     Status: None   Collection Time: 05/11/18  4:48 PM  Result Value Ref Range Status   Campylobacter species NOT DETECTED NOT DETECTED Final    Plesimonas shigelloides NOT DETECTED NOT DETECTED Final   Salmonella species NOT DETECTED NOT DETECTED Final   Yersinia enterocolitica NOT DETECTED NOT DETECTED Final   Vibrio species NOT DETECTED NOT DETECTED Final   Vibrio cholerae NOT DETECTED NOT DETECTED Final   Enteroaggregative E coli (EAEC) NOT DETECTED NOT DETECTED Final   Enteropathogenic E coli (EPEC) NOT DETECTED NOT DETECTED Final   Enterotoxigenic E coli (ETEC) NOT DETECTED NOT DETECTED Final   Shiga like toxin producing E coli (STEC) NOT DETECTED NOT DETECTED Final   Shigella/Enteroinvasive E coli (EIEC) NOT DETECTED NOT DETECTED Final   Cryptosporidium NOT DETECTED NOT DETECTED Final   Cyclospora cayetanensis NOT DETECTED NOT DETECTED Final   Entamoeba histolytica NOT DETECTED NOT DETECTED Final  Giardia lamblia NOT DETECTED NOT DETECTED Final   Adenovirus F40/41 NOT DETECTED NOT DETECTED Final   Astrovirus NOT DETECTED NOT DETECTED Final   Norovirus GI/GII NOT DETECTED NOT DETECTED Final   Rotavirus A NOT DETECTED NOT DETECTED Final   Sapovirus (I, II, IV, and V) NOT DETECTED NOT DETECTED Final    Comment: Performed at [facility], [address] [place], [place] [address]  C difficile quick scan w PCR reflex     Status: Abnormal   Collection Time: 05/11/18  4:49 PM  Result Value Ref Range Status   C Diff antigen POSITIVE (A) NEGATIVE Final   C Diff toxin NEGATIVE NEGATIVE Final   C Diff interpretation Results are indeterminate. See PCR results.  Final    Comment: Performed at [facility], [place] [address], [place], [place] [address]  C. Diff by PCR, Reflexed     Status: None   Collection Time: 05/11/18  4:49 PM  Result Value Ref Range Status   Toxigenic C. Difficile by PCR NEGATIVE NEGATIVE Final    Comment: Patient is colonized with non toxigenic C. difficile. May not need treatment unless significant symptoms are present. Performed at [facility], [place] [address]  [place], [place] [address]      Labs: BNP (last 3 results) No results for input(s): BNP in the last 8760 hours. Basic Metabolic Panel: Recent Labs  Lab 05/14/18 1055 05/16/18 1242  NA 140 140  K 3.1* 3.9  CL 108 103  CO2 25 26  GLUCOSE 109* 85  BUN 12 9  CREATININE 0.82 0.92  CALCIUM 7.9* 8.4*   Liver Function Tests: Recent Labs  Lab 05/16/18 1242  AST 23  ALT 41  ALKPHOS 110  BILITOT 0.5  PROT 6.9  ALBUMIN 3.7   No results for input(s): LIPASE, AMYLASE in the last 168 hours. No results for input(s): AMMONIA in the last 168 hours. CBC: Recent Labs  Lab 05/16/18 1242  WBC 3.2*  NEUTROABS 0.3*  HGB 10.1*  HCT 30.2*  MCV 87.5  PLT 59*   Cardiac Enzymes: No results for input(s): CKTOTAL, CKMB, CKMBINDEX, TROPONINI in the last 168 hours. BNP: Invalid input(s): POCBNP CBG: No results for input(s): GLUCAP in the last 168 hours. D-Dimer No results for input(s): DDIMER in the last 72 hours. Hgb A1c No results for input(s): HGBA1C in the last 72 hours. Lipid Profile No results for input(s): CHOL, HDL, LDLCALC, TRIG, CHOLHDL, LDLDIRECT in the last 72 hours. Thyroid function studies No results for input(s): TSH, T4TOTAL, T3FREE, THYROIDAB in the last 72 hours.  Invalid input(s): FREET3 Anemia work up No results for input(s): VITAMINB12, FOLATE, FERRITIN, TIBC, IRON, RETICCTPCT in the last 72 hours. Urinalysis    Component Value Date/Time   COLORURINE YELLOW 05/10/2018 1946   APPEARANCEUR HAZY (A) 05/10/2018 1946   LABSPEC >1.046 (H) 05/10/2018 1946   PHURINE 5.0 05/10/2018 1946   GLUCOSEU NEGATIVE 05/10/2018 1946   HGBUR MODERATE (A) 05/10/2018 1946   BILIRUBINUR NEGATIVE 05/10/2018 1946   KETONESUR 20 (A) 05/10/2018 1946   PROTEINUR 30 (A) 05/10/2018 1946   UROBILINOGEN 0.2 02/06/2009 2322   NITRITE NEGATIVE 05/10/2018 1946   LEUKOCYTESUR MODERATE (A) 05/10/2018 1946   Sepsis Labs Invalid input(s): PROCALCITONIN,  WBC,  LACTICIDVEN Microbiology Recent  Results (from the past 240 hour(s))  Gastrointestinal Panel by PCR , Stool     Status: None   Collection Time: 05/11/18  4:48 PM  Result Value Ref Range Status  Campylobacter species NOT DETECTED NOT DETECTED Final   Plesimonas shigelloides NOT DETECTED NOT DETECTED Final   Salmonella species NOT DETECTED NOT DETECTED Final   Yersinia enterocolitica NOT DETECTED NOT DETECTED Final   Vibrio species NOT DETECTED NOT DETECTED Final   Vibrio cholerae NOT DETECTED NOT DETECTED Final   Enteroaggregative E coli (EAEC) NOT DETECTED NOT DETECTED Final   Enteropathogenic E coli (EPEC) NOT DETECTED NOT DETECTED Final   Enterotoxigenic E coli (ETEC) NOT DETECTED NOT DETECTED Final   Shiga like toxin producing E coli (STEC) NOT DETECTED NOT DETECTED Final   Shigella/Enteroinvasive E coli (EIEC) NOT DETECTED NOT DETECTED Final   Cryptosporidium NOT DETECTED NOT DETECTED Final   Cyclospora cayetanensis NOT DETECTED NOT DETECTED Final   Entamoeba histolytica NOT DETECTED NOT DETECTED Final   Giardia lamblia NOT DETECTED NOT DETECTED Final   Adenovirus F40/41 NOT DETECTED NOT DETECTED Final   Astrovirus NOT DETECTED NOT DETECTED Final   Norovirus GI/GII NOT DETECTED NOT DETECTED Final   Rotavirus A NOT DETECTED NOT DETECTED Final   Sapovirus (I, II, IV, and V) NOT DETECTED NOT DETECTED Final    Comment: Performed at [facility], [address] [place], [place] [address]  C difficile quick scan w PCR reflex     Status: Abnormal   Collection Time: 05/11/18  4:49 PM  Result Value Ref Range Status   C Diff antigen POSITIVE (A) NEGATIVE Final   C Diff toxin NEGATIVE NEGATIVE Final   C Diff interpretation Results are indeterminate. See PCR results.  Final    Comment: Performed at [facility], [place], [place] [address] [place], [place] [address]  C. Diff by PCR, Reflexed     Status: None   Collection Time: 05/11/18  4:49 PM  Result Value Ref Range Status   Toxigenic C. Difficile by  PCR NEGATIVE NEGATIVE Final    Comment: Patient is colonized with non toxigenic C. difficile. May not need treatment unless significant symptoms are present. Performed at [facility], [place] [address], [place], [place] [address]      Time coordinating discharge: 32  minutes  SIGNED:   Hosie Poisson, MD  [facility] 05/20/2018, 9:22 AM Pager   If 7PM-7AM, please contact night-coverage [email or web address] Password [email or web address]

## 2018-05-23 ENCOUNTER — Inpatient Hospital Stay: Payer: Managed Care, Other (non HMO)

## 2018-05-23 ENCOUNTER — Other Ambulatory Visit: Payer: Self-pay

## 2018-05-23 ENCOUNTER — Encounter: Payer: Self-pay | Admitting: Physician Assistant

## 2018-05-23 ENCOUNTER — Telehealth: Payer: Self-pay | Admitting: Physician Assistant

## 2018-05-23 ENCOUNTER — Other Ambulatory Visit: Payer: Self-pay | Admitting: Internal Medicine

## 2018-05-23 ENCOUNTER — Inpatient Hospital Stay (HOSPITAL_BASED_OUTPATIENT_CLINIC_OR_DEPARTMENT_OTHER): Payer: Managed Care, Other (non HMO) | Admitting: Physician Assistant

## 2018-05-23 VITALS — BP 135/94 | HR 74 | Temp 98.6°F | Resp 18 | Ht 66.14 in | Wt 162.7 lb

## 2018-05-23 DIAGNOSIS — C3492 Malignant neoplasm of unspecified part of left bronchus or lung: Secondary | ICD-10-CM

## 2018-05-23 DIAGNOSIS — R5382 Chronic fatigue, unspecified: Secondary | ICD-10-CM

## 2018-05-23 LAB — CBC WITH DIFFERENTIAL (CANCER CENTER ONLY)
ABS IMMATURE GRANULOCYTES: 1.1 10*3/uL — AB (ref 0.00–0.07)
Basophils Absolute: 0.1 10*3/uL (ref 0.0–0.1)
Basophils Relative: 1 %
Eosinophils Absolute: 0.2 10*3/uL (ref 0.0–0.5)
Eosinophils Relative: 3 %
HCT: 28.7 % — ABNORMAL LOW (ref 36.0–46.0)
Hemoglobin: 9.3 g/dL — ABNORMAL LOW (ref 12.0–15.0)
Immature Granulocytes: 13 %
LYMPHS ABS: 1.5 10*3/uL (ref 0.7–4.0)
Lymphocytes Relative: 18 %
MCH: 29.6 pg (ref 26.0–34.0)
MCHC: 32.4 g/dL (ref 30.0–36.0)
MCV: 91.4 fL (ref 80.0–100.0)
Monocytes Absolute: 0.8 10*3/uL (ref 0.1–1.0)
Monocytes Relative: 9 %
Neutro Abs: 4.7 10*3/uL (ref 1.7–7.7)
Neutrophils Relative %: 56 %
Platelet Count: 154 10*3/uL (ref 150–400)
RBC: 3.14 MIL/uL — ABNORMAL LOW (ref 3.87–5.11)
RDW: 14.3 % (ref 11.5–15.5)
WBC Count: 8.4 10*3/uL (ref 4.0–10.5)
nRBC: 0 % (ref 0.0–0.2)

## 2018-05-23 LAB — CMP (CANCER CENTER ONLY)
ALT: 24 U/L (ref 0–44)
AST: 17 U/L (ref 15–41)
Albumin: 3.4 g/dL — ABNORMAL LOW (ref 3.5–5.0)
Alkaline Phosphatase: 163 U/L — ABNORMAL HIGH (ref 38–126)
Anion gap: 12 (ref 5–15)
BUN: 10 mg/dL (ref 8–23)
CO2: 25 mmol/L (ref 22–32)
Calcium: 8.3 mg/dL — ABNORMAL LOW (ref 8.9–10.3)
Chloride: 106 mmol/L (ref 98–111)
Creatinine: 0.83 mg/dL (ref 0.44–1.00)
GFR, Est AFR Am: >60 mL/min (ref >60)
GFR, Estimated: >60 mL/min (ref >60)
GLUCOSE: 139 mg/dL — AB (ref 70–99)
Potassium: 3.9 mmol/L (ref 3.5–5.1)
SODIUM: 143 mmol/L (ref 135–145)
Total Bilirubin: 0.2 mg/dL — ABNORMAL LOW (ref 0.3–1.2)
Total Protein: 6.5 g/dL (ref 6.5–8.1)

## 2018-05-23 LAB — TSH: TSH: 1.139 u[IU]/mL (ref 0.308–3.960)

## 2018-05-23 MED ORDER — ONDANSETRON 4 MG PO TBDP: 4.0000 mg | ORAL_TABLET | Freq: Three times a day (TID) | ORAL | 0 refills | Status: DC | PRN

## 2018-05-23 NOTE — Telephone Encounter (Signed)
Gave avs and calendar ° °

## 2018-05-23 NOTE — Progress Notes (Signed)
[facility] OFFICE PROGRESS NOTE  [name], NP [place] [address]  DIAGNOSIS: stage IV (T1a, N0, M1 a) non-small cell lung cancer, adenocarcinoma presented with multifocal disease in both lungs including 3 nodules in the left lung as well as 2 nodules in the right lung diagnosed in December 2019.  No actionable mutations KRAS G12C BCOR N1425S-subclonal RAD21 amplification  PDL 1 expression 1%.  PRIOR THERAPY: None  CURRENT THERAPY: Systemic chemotherapy with carboplatin for AUC of 5, Alimta 500 mg/M2 and Keytruda 200 mg IV every 3 weeks.  First dose April 12, 2018. Status post 2 cycles.   INTERVAL HISTORY: AERIN DELANY 63 y.o. female returns to the clinic for a follow-up visit.  The patient was recently hospitalized for an acute kidney injury secondary to dehydration from nausea, vomiting, and diarrhea following her last treatment of chemotherapy. C.Diff and GI viral panel negative. She had a CT abdomen which was unremarkable. Upon discharge, routine labs at the clinic showed neutropenia with an [place] of 0.3. She received 3 nivestym injections without any concerning complaints. The patient is due for cycle #3 today. The patient is still feeling weak since her hospitalization. She has lost 8lbs from her hospitalization but has gained ~4lbs back. She states she is eating and drinking as per usual and trying to recover but is not quite back to her baseline. Today, she denies fever, chills, or night sweats. She denies any chest pain, shortness of breath, cough, or hemoptysis. She has not had any more nausea, vomiting, or diarrhea since discharged from the hospital. She denies constipation. She denies any rashes or skin changes. She denies any headache or visual changes. She is here today for evaluation before starting cycle #3.   MEDICAL HISTORY: Past Medical History:  Diagnosis Date  . Anemia    "long time ago" (12/06/2012)  . Anxiety   .  Arthritis    "right knee real bad; in my hands bad" (12/06/2012)  . Asthma   . Celiac disease   . Colon polyps   . COPD (chronic obstructive pulmonary disease) ([place])   . Coronary artery disease    a. s/p Xience DES to [facility] 04/2008;  b. LHC 05/2008: Proximal RCA 25%, mid RCA stent patent.  c. Anormal nuc 2014 -> s/p LHC with severe mRCA stenosis s/p DES. d. Cath 04/2013 s/p DES to LAD.  e. LHC 08/2015 was stable.  . Depression    "years ago" (12/06/2012)  . Fibromyalgia   . Gallstones   . GERD (gastroesophageal reflux disease)   . H/O hiatal hernia   . Hepatitis    "not A, B, or C" (12/06/2012)  . Hyperlipidemia    intol to statins and other chol agents due to elevated LFTs  . Hypertension   . Hypothyroidism   . IBS (irritable bowel syndrome)   . Interstitial cystitis   . Lung cancer ([place])   . Lung nodules    Bilateral  . Migraines    "when I was younger" (12/06/2012)  . Pneumonia   . PONV (postoperative nausea and vomiting)    "Very bad"  . Premature atrial contractions   . PVC's (premature ventricular contractions)   . Sinus headache   . SVT (supraventricular tachycardia) ([facility])     ALLERGIES:  is allergic to ciprofloxacin; levofloxacin; statins; tricor [fenofibrate]; latex; codeine; and metoprolol.  MEDICATIONS:  Current Outpatient Medications  Medication Sig Dispense Refill  . albuterol (PROAIR HFA) 108 (90 Base) MCG/ACT inhaler  Inhale 1 puff into the lungs every 6 (six) hours as needed (wheezing/shortness of breath.).     Marland Kitchen albuterol (PROVENTIL) (5 MG/ML) 0.5% nebulizer solution Take 2.5 mg by nebulization every 6 (six) hours as needed for wheezing.    Marland Kitchen aspirin 81 MG chewable tablet Chew 1 tablet (81 mg total) by mouth daily.    . BELSOMRA 20 MG TABS Take 20 mg by mouth at bedtime as needed for sleep.    Marland Kitchen buPROPion (WELLBUTRIN SR) 150 MG 12 hr tablet Take 150-300 mg by mouth See admin instructions. Take 2 tablets (300 mg) by mouth daily in the morning & take 1 tablet (150  mg) by mouth in the evening.    . clopidogrel (PLAVIX) 75 MG tablet Take 1 tablet (75 mg total) by mouth daily. MUST SCHEDULE APPOINTMENT FOR FURTHER REFILLS 30 tablet 0  . DULoxetine (CYMBALTA) 60 MG capsule Take 120 mg by mouth daily.    . ergocalciferol (VITAMIN D2) 50000 UNITS capsule Take 50,000 Units by mouth every Wednesday.     . folic acid (FOLVITE) 1 MG tablet Take 1 tablet (1 mg total) by mouth daily. 30 tablet 4  . gabapentin (NEURONTIN) 100 MG capsule Take 100 mg by mouth daily.     Marland Kitchen HYDROcodone-acetaminophen (NORCO) 7.5-325 MG per tablet Take 1 tablet by mouth every 6 (six) hours as needed for pain.    . hydrocortisone (ANUSOL-HC) 25 MG suppository Place 1 suppository (25 mg total) rectally at bedtime. For one week and then as needed for hemorrhoids 12 suppository 1  . Hydrocortisone (GERHARDT'S BUTT CREAM) CREA Apply 1 application topically 4 (four) times daily. 1 each 0  . levothyroxine (SYNTHROID, LEVOTHROID) 25 MCG tablet Take 25 mcg by mouth daily before breakfast.    . lidocaine-prilocaine (EMLA) cream Apply 1 application topically as needed. 30 g 0  . loperamide (IMODIUM) 2 MG capsule Take 1 capsule (2 mg total) by mouth as needed for diarrhea or loose stools (Initiate if stool C diff -ve). 30 capsule 0  . LORazepam (ATIVAN) 2 MG tablet Take 2 mg by mouth See admin instructions. Take 1 tablet (2 mg) by mouth scheduled at bedtime & take 1 tablet (2 mg) by mouth twice daily if needed for anxiety    . metoprolol tartrate (LOPRESSOR) 25 MG tablet Take 1 tablet (25 mg total) by mouth 2 (two) times daily. 180 tablet 3  . montelukast (SINGULAIR) 10 MG tablet Take 10 mg by mouth at bedtime.     . nitroGLYCERIN (NITROSTAT) 0.4 MG SL tablet Place 1 tablet (0.4 mg total) under the tongue every 5 (five) minutes as needed for chest pain. 25 tablet 12  . omeprazole (PRILOSEC) 40 MG capsule Take 1 tablet by mouth twice a day for 2 weeks then 1 tablet every morning thereafter 45 capsule 8  .  prochlorperazine (COMPAZINE) 10 MG tablet TAKE 1 TABLET(10 MG) BY MOUTH EVERY 6 HOURS AS NEEDED FOR NAUSEA OR VOMITING 30 tablet 0  . SYMBICORT 160-4.5 MCG/ACT inhaler Take 2 puffs by mouth 2 (two) times daily.  3  . temazepam (RESTORIL) 15 MG capsule Take 15 mg by mouth at bedtime.     . vitamin B-12 100 MCG tablet Take 1 tablet (100 mcg total) by mouth daily. 30 tablet 1  . amLODipine (NORVASC) 5 MG tablet Take 1.5 tablets (7.5 mg total) by mouth daily. (Patient taking differently: Take 7.5 mg by mouth every evening. ) 180 tablet 3  . ondansetron (ZOFRAN ODT) 4  MG disintegrating tablet Take 1 tablet (4 mg total) by mouth every 8 (eight) hours as needed for nausea or vomiting. 20 tablet 0   No current facility-administered medications for this visit.     SURGICAL HISTORY:  Past Surgical History:  Procedure Laterality Date  . CARDIAC CATHETERIZATION  04/2008; 05/2008  . CARDIAC CATHETERIZATION N/A 08/07/2015   Procedure: Left Heart Cath and Coronary Angiography;  Surgeon: Sherren Mocha, MD;  Location: [place] CV LAB;  Service: Cardiovascular;  Laterality: N/A;  . CHOLECYSTECTOMY    . CORONARY ANGIOPLASTY WITH STENT PLACEMENT  04/2008 12/06/2012   "1 + 1" (12/06/2012)  . CORONARY STENT PLACEMENT  05/08/2013   DES TO LAD      DR MCALHANY  . IR IMAGING GUIDED PORT INSERTION  04/29/2018  . LEFT HEART CATHETERIZATION WITH CORONARY ANGIOGRAM N/A 12/06/2012   Procedure: LEFT HEART CATHETERIZATION WITH CORONARY ANGIOGRAM;  Surgeon: [name], MD;  Location: [facility] CATH LAB;  Service: Cardiovascular;  Laterality: N/A;  . LEFT HEART CATHETERIZATION WITH CORONARY ANGIOGRAM N/A 05/08/2013   Procedure: LEFT HEART CATHETERIZATION WITH CORONARY ANGIOGRAM;  Surgeon: [name], MD;  Location: [facility] CATH LAB;  Service: Cardiovascular;  Laterality: N/A;  . LEFT HEART CATHETERIZATION WITH CORONARY ANGIOGRAM N/A 01/04/2014   Procedure: LEFT HEART CATHETERIZATION WITH CORONARY ANGIOGRAM;  Surgeon:  [name], MD;  Location: [facility] CATH LAB;  Service: Cardiovascular;  Laterality: N/A;  . PERCUTANEOUS CORONARY STENT INTERVENTION (PCI-S)  12/06/2012   Procedure: PERCUTANEOUS CORONARY STENT INTERVENTION (PCI-S);  Surgeon: [name], MD;  Location: [facility] CATH LAB;  Service: Cardiovascular;;  . PERCUTANEOUS CORONARY STENT INTERVENTION (PCI-S)  05/08/2013   Procedure: PERCUTANEOUS CORONARY STENT INTERVENTION (PCI-S);  Surgeon: [name], MD;  Location: [facility] CATH LAB;  Service: Cardiovascular;;  mid LAD   . TUBAL LIGATION    . VAGINAL HYSTERECTOMY    . VIDEO BRONCHOSCOPY WITH ENDOBRONCHIAL NAVIGATION N/A 02/17/2018   Procedure: VIDEO BRONCHOSCOPY WITH ENDOBRONCHIAL NAVIGATION;  Surgeon: Melrose Nakayama, MD;  Location: [place];  Service: Thoracic;  Laterality: N/A;    REVIEW OF SYSTEMS:   Review of Systems  Constitutional: Negative for appetite change, chills, fatigue, fever and unexpected weight change.  HENT:   Negative for mouth sores, nosebleeds, sore throat and trouble swallowing.   Eyes: Negative for eye problems and icterus.  Respiratory: Negative for cough, hemoptysis, shortness of breath and wheezing.   Cardiovascular: Negative for chest pain and leg swelling.  Gastrointestinal: Negative for abdominal pain, constipation, diarrhea, nausea and vomiting.  Genitourinary: Negative for bladder incontinence, difficulty urinating, dysuria, frequency and hematuria.   Musculoskeletal: Negative for back pain, gait problem, neck pain and neck stiffness.  Skin: Negative for itching and rash.  Neurological: Negative for dizziness, extremity weakness, gait problem, headaches, light-headedness and seizures.  Hematological: Negative for adenopathy. Does not bruise/bleed easily.  Psychiatric/Behavioral: Negative for confusion, depression and sleep disturbance. The patient is not nervous/anxious.     PHYSICAL EXAMINATION:  Blood pressure (!) 135/94, pulse 74, temperature  98.6 F (37 C), temperature source Oral, resp. rate 18, height 5' 6.14" (1.68 m), weight 162 lb 11.2 oz (73.8 kg), SpO2 97 %.  ECOG PERFORMANCE STATUS: 1 - Symptomatic but completely ambulatory  Physical Exam  Constitutional: Oriented to person, place, and time and well-developed, well-nourished, and in no distress. No distress.  HENT:  Head: Normocephalic and atraumatic.  Mouth/Throat: Oropharynx is clear and moist. No oropharyngeal exudate.  Eyes: Conjunctivae are normal. Right eye exhibits no discharge.  Left eye exhibits no discharge. No scleral icterus.  Neck: Normal range of motion. Neck supple.  Cardiovascular: Normal rate, regular rhythm, normal heart sounds and intact distal pulses.   Pulmonary/Chest: Effort normal and breath sounds normal. No respiratory distress. No wheezes. No rales.  Abdominal: Soft. Bowel sounds are normal. Exhibits no distension and no mass. There is no tenderness.  Musculoskeletal: Normal range of motion. Exhibits no edema.  Lymphadenopathy:    No cervical adenopathy.  Neurological: Alert and oriented to person, place, and time. Exhibits normal muscle tone. Gait normal. Coordination normal.  Skin: Skin is warm and dry. No rash noted. Not diaphoretic. No erythema. No pallor.  Psychiatric: Mood, memory and judgment normal.  Vitals reviewed.  LABORATORY DATA: Lab Results  Component Value Date   WBC 8.4 05/23/2018   HGB 9.3 (L) 05/23/2018   HCT 28.7 (L) 05/23/2018   MCV 91.4 05/23/2018   PLT 154 05/23/2018      Chemistry      Component Value Date/Time   NA 143 05/23/2018 1009   NA 143 01/10/2018 1629   K 3.9 05/23/2018 1009   CL 106 05/23/2018 1009   CO2 25 05/23/2018 1009   BUN 10 05/23/2018 1009   BUN 14 01/10/2018 1629   CREATININE 0.83 05/23/2018 1009   CREATININE 0.68 08/05/2015 1456      Component Value Date/Time   CALCIUM 8.3 (L) 05/23/2018 1009   ALKPHOS 163 (H) 05/23/2018 1009   AST 17 05/23/2018 1009   ALT 24 05/23/2018 1009    BILITOT 0.2 (L) 05/23/2018 1009       RADIOGRAPHIC STUDIES:  Ct Head Wo Contrast  Result Date: 05/13/2018 CLINICAL DATA:  TIA.  Weakness and confusion.  Lung cancer. EXAM: CT HEAD WITHOUT CONTRAST TECHNIQUE: Contiguous axial images were obtained from the base of the skull through the vertex without intravenous contrast. COMPARISON:  None. FINDINGS: Brain: Ventricle size and cerebral volume normal for age. Mild changes in the periventricular white matter appear chronic. No acute infarct, hemorrhage, mass. No edema. Vascular: Negative for hyperdense vessel Skull: Negative Sinuses/Orbits: Negative Other: None IMPRESSION: No acute intracranial abnormality. Mild chronic white matter changes most likely due to chronic microvascular ischemia If there is concern of metastatic lung cancer, MRI of the brain without and with contrast recommended. If the patient could not have MRI, postcontrast CT recommended as an alternative. Electronically Signed   By: Franchot Gallo M.D.   On: 05/13/2018 11:21   Ct Abdomen Pelvis W Contrast  Result Date: 05/11/2018 CLINICAL DATA:  Nausea, vomiting, diarrhea. Ongoing chemotherapy for lung cancer. MEDICATIONS not seen. EXAM: CT ABDOMEN AND PELVIS WITH CONTRAST TECHNIQUE: Multidetector CT imaging of the abdomen and pelvis was performed using the standard protocol following bolus administration of intravenous contrast. CONTRAST:  81m ISOVUE-300 IOPAMIDOL (ISOVUE-300) INJECTION 61% COMPARISON:  CT abdomen and pelvis 12/08/2012. PET-CT 02/03/2018 FINDINGS: Lower chest: Lung bases are clear. Hepatobiliary: No focal liver abnormality is seen. Status post cholecystectomy. No biliary dilatation. Pancreas: Unremarkable. No pancreatic ductal dilatation or surrounding inflammatory changes. Spleen: Normal in size without focal abnormality. Adrenals/Urinary Tract: Adrenal glands are unremarkable. Kidneys are normal, without renal calculi, focal lesion, or hydronephrosis. Bladder is  unremarkable. Stomach/Bowel: Stomach, small bowel, and colon are not abnormally distended. No wall thickening or inflammatory changes appreciated. Appendix is normal. Vascular/Lymphatic: Aortic atherosclerosis. No enlarged abdominal or pelvic lymph nodes. Reproductive: Status post hysterectomy. No adnexal masses. Linear metallic density or suture in the low pelvis, unchanged since 2014 study. Other: No  free air or free fluid in the abdomen. Abdominal wall musculature appears intact. Musculoskeletal: No acute or significant osseous findings. IMPRESSION: No acute process demonstrated in the abdomen or pelvis. No evidence of bowel obstruction or inflammation. Aortic Atherosclerosis (ICD10-I70.0). Electronically Signed   By: Lucienne Capers M.D.   On: 05/11/2018 00:49   Ir Imaging Guided Port Insertion  Result Date: 04/29/2018 INDICATION: 63 year old female with stage IV left lung adenocarcinoma. She presents for durable venous access for chemotherapy. EXAM: IMPLANTED PORT A CATH PLACEMENT WITH ULTRASOUND AND FLUOROSCOPIC GUIDANCE MEDICATIONS: 2 g Ancef; The antibiotic was administered within an appropriate time interval prior to skin puncture. ANESTHESIA/SEDATION: Versed 4 mg IV; Fentanyl 100 mcg IV; Moderate Sedation Time:  20 minutes The patient was continuously monitored during the procedure by the interventional radiology nurse under my direct supervision. FLUOROSCOPY TIME:  0 minutes, 18 seconds (4 mGy) COMPLICATIONS: None immediate. PROCEDURE: The right neck and chest was prepped with chlorhexidine, and draped in the usual sterile fashion using maximum barrier technique (cap and mask, sterile gown, sterile gloves, large sterile sheet, hand hygiene and cutaneous antiseptic). Local anesthesia was attained by infiltration with 1% lidocaine with epinephrine. Ultrasound demonstrated patency of the right internal jugular vein, and this was documented with an image. Under real-time ultrasound guidance, this vein  was accessed with a 21 gauge micropuncture needle and image documentation was performed. A small dermatotomy was made at the access site with an 11 scalpel. A 0.018" wire was advanced into the SVC and the access needle exchanged for a 12F micropuncture vascular sheath. The 0.018" wire was then removed and a 0.035" wire advanced into the IVC. An appropriate location for the subcutaneous reservoir was selected below the clavicle and an incision was made through the skin and underlying soft tissues. The subcutaneous tissues were then dissected using a combination of blunt and sharp surgical technique and a pocket was formed. A single lumen power injectable portacatheter was then tunneled through the subcutaneous tissues from the pocket to the dermatotomy and the port reservoir placed within the subcutaneous pocket. The venous access site was then serially dilated and a peel away vascular sheath placed over the wire. The wire was removed and the port catheter advanced into position under fluoroscopic guidance. The catheter tip is positioned in the superior cavoatrial junction. This was documented with a spot image. The portacatheter was then tested and found to flush and aspirate well. The port was flushed with saline followed by 100 units/mL heparinized saline. The pocket was then closed in two layers using first subdermal inverted interrupted absorbable sutures followed by a running subcuticular suture. The epidermis was then sealed with Dermabond. The dermatotomy at the venous access site was also closed with Dermabond. IMPRESSION: Successful placement of a right IJ approach Power Port with ultrasound and fluoroscopic guidance. The catheter is ready for use. Electronically Signed   By: Jacqulynn Cadet M.D.   On: 04/29/2018 15:22     ASSESSMENT/PLAN:  This is a very pleasant 63 year old Caucasian female who was recently diagnosed with stage IV non-small cell lung cancer, adenocarcinoma with no actionable  mutations and PDL 1 expression of 1%.  She was diagnosed in December 2019 with bilateral pulmonary nodules. She is currently undergoing treatment with carboplatin for an AUC of 5, Alimta 558m/m2, and Keytruda 2065mIV every 3 weeks.  She is status post 2 cycles.  She was recently admitted to the hospital for an acute kidney injury secondary to dehydration from nausea, vomiting, and diarrhea.  There was no evidence of an infectious etiology. CT imaging did not show any evidence for colitis. The patient was seen with Dr. Julien Nordmann today.  Patient continues to endorse weakness and fatigue. She is interested in delaying treatment today considering she is still recovering from her recent hospitalization. We will delay treatment for approximately 1 week.   I will arrange for her to have a restaging CT of the chest performed prior to her next office appointment. The patient recently had a CT abdomen and pelvis performed while hospitalized and will not need repeat imaging of this region.  We will see her back in 4 weeks for evaluation and to review her scan results.    For the patient's breaththrough nausea/vomiting, the patient has inquired about a non-tablet form of an anti-emetic. I have sent a prescription for sublingual Zofran to the patient's pharmacy. The patient was advised to call immediately if she has any concerning symptoms in the interval. The patient voices understanding of current disease status and treatment options and is in agreement with the current care plan. All questions were answered. The patient knows to call the clinic with any problems, questions or concerns. We can certainly see the patient much sooner if necessary  Orders Placed This Encounter  Procedures  . CT Chest W Contrast    Standing Status:   Future    Standing Expiration Date:   05/23/2019    Order Specific Question:   ** REASON FOR EXAM (FREE TEXT)    Answer:   Restaging Lung Cancer    Order Specific Question:   If  indicated for the ordered procedure, I authorize the administration of contrast media per Radiology protocol    Answer:   Yes    Order Specific Question:   Preferred imaging location?    Answer:   [facility]    Order Specific Question:   Radiology Contrast Protocol - do NOT remove file path    Answer:   [email or web address]     Cassandra L Heilingoetter, PA-C 05/23/18  ADDENDUM: Hematology/Oncology Attending: I had a face-to-face encounter with the patient today.  I recommended her care plan.  This is a very pleasant 63 years old white female with metastatic non-small cell lung cancer, adenocarcinoma with no actionable mutation.  She is currently undergoing systemic chemotherapy with carboplatin, Alimta and Keytruda status post 2 cycles.  The patient was admitted to [facility] long hospital with nausea vomiting diarrhea as well as dehydration and acute kidney injury.  She was treated with IV hydration and she is feeling much better today except for fatigue. The patient would like to delay her treatment by at least a week to get more recovery before starting cycle #3. We will plan for her treatment to be given next week. I will see her back for follow-up visit in 4 weeks for evaluation after repeating CT scan of the chest, abdomen and pelvis for restaging of her disease. The patient was advised to call immediately if she has any concerning symptoms in the interval. Disclaimer: This note was dictated with voice recognition software. Similar sounding words can inadvertently be transcribed and may be missed upon review. Eilleen Kempf, MD 05/23/18

## 2018-05-24 ENCOUNTER — Telehealth: Payer: Self-pay | Admitting: Medical Oncology

## 2018-05-24 ENCOUNTER — Other Ambulatory Visit: Payer: Self-pay | Admitting: Internal Medicine

## 2018-05-24 NOTE — Telephone Encounter (Signed)
Authorization obtained for [employer] or [employer].

## 2018-05-25 ENCOUNTER — Ambulatory Visit (HOSPITAL_COMMUNITY): Payer: Managed Care, Other (non HMO)

## 2018-05-26 ENCOUNTER — Telehealth: Payer: Self-pay | Admitting: *Deleted

## 2018-05-27 ENCOUNTER — Other Ambulatory Visit: Payer: Self-pay | Admitting: Internal Medicine

## 2018-05-30 ENCOUNTER — Inpatient Hospital Stay: Payer: Managed Care, Other (non HMO)

## 2018-05-30 ENCOUNTER — Other Ambulatory Visit: Payer: Self-pay

## 2018-05-30 ENCOUNTER — Other Ambulatory Visit: Payer: Managed Care, Other (non HMO)

## 2018-05-30 VITALS — BP 154/87 | HR 89 | Temp 97.9°F | Resp 18

## 2018-05-30 DIAGNOSIS — Z95828 Presence of other vascular implants and grafts: Secondary | ICD-10-CM

## 2018-05-30 DIAGNOSIS — C3492 Malignant neoplasm of unspecified part of left bronchus or lung: Secondary | ICD-10-CM | POA: Diagnosis not present

## 2018-05-30 LAB — CBC WITH DIFFERENTIAL (CANCER CENTER ONLY)
Abs Immature Granulocytes: 0.03 10*3/uL (ref 0.00–0.07)
Basophils Absolute: 0 10*3/uL (ref 0.0–0.1)
Basophils Relative: 1 %
Eosinophils Absolute: 0.1 10*3/uL (ref 0.0–0.5)
Eosinophils Relative: 1 %
HEMATOCRIT: 28.2 % — AB (ref 36.0–46.0)
HEMOGLOBIN: 9.4 g/dL — AB (ref 12.0–15.0)
Immature Granulocytes: 1 %
LYMPHS ABS: 1.4 10*3/uL (ref 0.7–4.0)
LYMPHS PCT: 29 %
MCH: 30.3 pg (ref 26.0–34.0)
MCHC: 33.3 g/dL (ref 30.0–36.0)
MCV: 91 fL (ref 80.0–100.0)
MONOS PCT: 8 %
Monocytes Absolute: 0.4 10*3/uL (ref 0.1–1.0)
Neutro Abs: 2.9 10*3/uL (ref 1.7–7.7)
Neutrophils Relative %: 60 %
Platelet Count: 218 10*3/uL (ref 150–400)
RBC: 3.1 MIL/uL — ABNORMAL LOW (ref 3.87–5.11)
RDW: 15.6 % — ABNORMAL HIGH (ref 11.5–15.5)
WBC Count: 4.8 10*3/uL (ref 4.0–10.5)
nRBC: 0 % (ref 0.0–0.2)

## 2018-05-30 LAB — CMP (CANCER CENTER ONLY)
ALT: 94 U/L — AB (ref 0–44)
AST: 82 U/L — ABNORMAL HIGH (ref 15–41)
Albumin: 3.8 g/dL (ref 3.5–5.0)
Alkaline Phosphatase: 169 U/L — ABNORMAL HIGH (ref 38–126)
Anion gap: 9 (ref 5–15)
BUN: 14 mg/dL (ref 8–23)
CO2: 25 mmol/L (ref 22–32)
Calcium: 9 mg/dL (ref 8.9–10.3)
Chloride: 102 mmol/L (ref 98–111)
Creatinine: 0.93 mg/dL (ref 0.44–1.00)
GFR, Est AFR Am: >60 mL/min (ref >60)
GFR, Estimated: >60 mL/min (ref >60)
Glucose, Bld: 107 mg/dL — ABNORMAL HIGH (ref 70–99)
Potassium: 4.5 mmol/L (ref 3.5–5.1)
Sodium: 136 mmol/L (ref 135–145)
Total Bilirubin: 0.5 mg/dL (ref 0.3–1.2)
Total Protein: 6.9 g/dL (ref 6.5–8.1)

## 2018-05-30 MED ORDER — CYANOCOBALAMIN 1000 MCG/ML IJ SOLN
INTRAMUSCULAR | Status: AC
  Filled 2018-05-30: qty 1

## 2018-05-30 MED ORDER — SODIUM CHLORIDE 0.9 % IV SOLN
Freq: Once | INTRAVENOUS | Status: AC
  Administered 2018-05-30: 14:00:00 via INTRAVENOUS
  Filled 2018-05-30: qty 250

## 2018-05-30 MED ORDER — CYANOCOBALAMIN 1000 MCG/ML IJ SOLN
1000.0000 ug | Freq: Once | INTRAMUSCULAR | Status: AC
  Administered 2018-05-30: 1000 ug via INTRAMUSCULAR

## 2018-05-30 MED ORDER — SODIUM CHLORIDE 0.9 % IV SOLN
480.0000 mg | Freq: Once | INTRAVENOUS | Status: AC
  Administered 2018-05-30: 480 mg via INTRAVENOUS
  Filled 2018-05-30: qty 48

## 2018-05-30 MED ORDER — SODIUM CHLORIDE 0.9 % IV SOLN
Freq: Once | INTRAVENOUS | Status: AC
  Administered 2018-05-30: 14:00:00 via INTRAVENOUS
  Filled 2018-05-30: qty 5

## 2018-05-30 MED ORDER — PALONOSETRON HCL INJECTION 0.25 MG/5ML
0.2500 mg | Freq: Once | INTRAVENOUS | Status: AC
  Administered 2018-05-30: 0.25 mg via INTRAVENOUS

## 2018-05-30 MED ORDER — PALONOSETRON HCL INJECTION 0.25 MG/5ML
INTRAVENOUS | Status: AC
  Filled 2018-05-30: qty 5

## 2018-05-30 MED ORDER — SODIUM CHLORIDE 0.9% FLUSH
10.0000 mL | Freq: Once | INTRAVENOUS | Status: AC
  Administered 2018-05-30: 10 mL
  Filled 2018-05-30: qty 10

## 2018-05-30 MED ORDER — HEPARIN SOD (PORK) LOCK FLUSH 100 UNIT/ML IV SOLN
500.0000 [IU] | Freq: Once | INTRAVENOUS | Status: AC | PRN
  Administered 2018-05-30: 500 [IU]
  Filled 2018-05-30: qty 5

## 2018-05-30 MED ORDER — SODIUM CHLORIDE 0.9 % IV SOLN
1000.0000 mg | Freq: Once | INTRAVENOUS | Status: AC
  Administered 2018-05-30: 1000 mg via INTRAVENOUS
  Filled 2018-05-30: qty 40

## 2018-05-30 MED ORDER — SODIUM CHLORIDE 0.9% FLUSH
10.0000 mL | INTRAVENOUS | Status: DC | PRN
  Administered 2018-05-30: 10 mL
  Filled 2018-05-30: qty 10

## 2018-05-30 NOTE — Patient Instructions (Addendum)
Coronavirus (COVID-19) Are you at risk?  Are you at risk for the Coronavirus (COVID-19)?  To be considered HIGH RISK for Coronavirus (COVID-19), you have to meet the following criteria:  . Traveled to [place], [place], [place], [place] or [place]; or in the [place] to [place], [place], [place], or [place]; and have fever, cough, and shortness of breath within the last 2 weeks of travel OR . Been in close contact with a person diagnosed with COVID-19 within the last 2 weeks and have fever, cough, and shortness of breath . IF YOU DO NOT MEET THESE CRITERIA, YOU ARE CONSIDERED LOW RISK FOR COVID-19.  What to do if you are HIGH RISK for COVID-19?  Marland Kitchen If you are having a medical emergency, call 911. . Seek medical care right away. Before you go to a doctor's office, urgent care or emergency department, call ahead and tell them about your recent travel, contact with someone diagnosed with COVID-19, and your symptoms. You should receive instructions from your physician's office regarding next steps of care.  . When you arrive at healthcare provider, tell the healthcare staff immediately you have returned from visiting [place], [place], [place], [place] or [place]; or traveled in the [place] to [place], [place], [place], or [place]; in the last two weeks or you have been in close contact with a person diagnosed with COVID-19 in the last 2 weeks.   . Tell the health care staff about your symptoms: fever, cough and shortness of breath. . After you have been seen by a medical provider, you will be either: o Tested for (COVID-19) and discharged home on quarantine except to seek medical care if symptoms worsen, and asked to  - Stay home and avoid contact with others until you get your results (4-5 days)  - Avoid travel on public transportation if possible (such as bus, train, or airplane) or o Sent to the Emergency Department by EMS for evaluation, COVID-19 testing, and possible  admission depending on your condition and test results.  What to do if you are LOW RISK for COVID-19?  Reduce your risk of any infection by using the same precautions used for avoiding the common cold or flu:  Marland Kitchen Wash your hands often with soap and warm water for at least 20 seconds.  If soap and water are not readily available, use an alcohol-based hand sanitizer with at least 60% alcohol.  . If coughing or sneezing, cover your mouth and nose by coughing or sneezing into the elbow areas of your shirt or coat, into a tissue or into your sleeve (not your hands). . Avoid shaking hands with others and consider head nods or verbal greetings only. . Avoid touching your eyes, nose, or mouth with unwashed hands.  . Avoid close contact with people who are sick. . Avoid places or events with large numbers of people in one location, like concerts or sporting events. . Carefully consider travel plans you have or are making. . If you are planning any travel outside or inside the [place], visit the CDC's Travelers' Health webpage for the latest health notices. . If you have some symptoms but not all symptoms, continue to monitor at home and seek medical attention if your symptoms worsen. . If you are having a medical emergency, call 911.   [place] / e-Visit: [email or web address]         [facility]: [place]  [facility]gent Care: [place] [facility] [place] [facility]: [place] Discharge Instructions for Patients Receiving Chemotherapy  Today you received the following chemotherapy agents Alimta, Carboplatin.  To help prevent nausea and vomiting after your treatment, we encourage you to take your nausea medication as directed.  If you develop nausea and vomiting that is not controlled by your nausea medication, call the clinic.    BELOW ARE SYMPTOMS THAT SHOULD BE REPORTED IMMEDIATELY:  *FEVER GREATER THAN 100.5 F  *CHILLS WITH OR WITHOUT FEVER  NAUSEA AND VOMITING THAT IS NOT CONTROLLED WITH YOUR NAUSEA MEDICATION  *UNUSUAL SHORTNESS OF BREATH  *UNUSUAL BRUISING OR BLEEDING  TENDERNESS IN MOUTH AND THROAT WITH OR WITHOUT PRESENCE OF ULCERS  *URINARY PROBLEMS  *BOWEL PROBLEMS  UNUSUAL RASH Items with * indicate a potential emergency and should be followed up as soon as possible.  Feel free to call the clinic should you have any questions or concerns. The clinic phone number is [phone].  Please show the [place] at check-in to the Emergency Department and triage nurse.

## 2018-05-30 NOTE — Progress Notes (Signed)
Spoke w/ Dr. Julien Nordmann today, patient received B12 injection in the hospital 05/14/18 for low vitamin B12 level. She would be due for B12 injection today since it is her 3rd cycle of Alimta - give B12 injection today given recent low level per Dr. Julien Nordmann.   Demetrius Charity, PharmD, [facility] Pharmacist Pharmacy Phone: [phone] 05/30/2018

## 2018-05-30 NOTE — Progress Notes (Signed)
Per Dr Julien Nordmann hold [name] today. It is okay to give carboplatin and alimta today and todays CMP results.

## 2018-06-01 ENCOUNTER — Other Ambulatory Visit: Payer: Self-pay | Admitting: Medical Oncology

## 2018-06-01 ENCOUNTER — Telehealth: Payer: Self-pay | Admitting: Internal Medicine

## 2018-06-01 ENCOUNTER — Inpatient Hospital Stay: Payer: Managed Care, Other (non HMO) | Attending: Internal Medicine

## 2018-06-01 ENCOUNTER — Other Ambulatory Visit: Payer: Self-pay | Admitting: Internal Medicine

## 2018-06-01 ENCOUNTER — Other Ambulatory Visit: Payer: Self-pay

## 2018-06-01 VITALS — BP 111/67 | HR 88 | Temp 98.3°F | Resp 18

## 2018-06-01 DIAGNOSIS — C3490 Malignant neoplasm of unspecified part of unspecified bronchus or lung: Secondary | ICD-10-CM | POA: Insufficient documentation

## 2018-06-01 DIAGNOSIS — C3492 Malignant neoplasm of unspecified part of left bronchus or lung: Secondary | ICD-10-CM

## 2018-06-01 DIAGNOSIS — Z5112 Encounter for antineoplastic immunotherapy: Secondary | ICD-10-CM

## 2018-06-01 DIAGNOSIS — R531 Weakness: Secondary | ICD-10-CM | POA: Diagnosis not present

## 2018-06-01 DIAGNOSIS — K589 Irritable bowel syndrome without diarrhea: Secondary | ICD-10-CM | POA: Insufficient documentation

## 2018-06-01 DIAGNOSIS — Z7902 Long term (current) use of antithrombotics/antiplatelets: Secondary | ICD-10-CM | POA: Diagnosis not present

## 2018-06-01 DIAGNOSIS — J449 Chronic obstructive pulmonary disease, unspecified: Secondary | ICD-10-CM | POA: Diagnosis not present

## 2018-06-01 DIAGNOSIS — Z7689 Persons encountering health services in other specified circumstances: Secondary | ICD-10-CM | POA: Insufficient documentation

## 2018-06-01 DIAGNOSIS — Z862 Personal history of diseases of the blood and blood-forming organs and certain disorders involving the immune mechanism: Secondary | ICD-10-CM

## 2018-06-01 DIAGNOSIS — Z5111 Encounter for antineoplastic chemotherapy: Secondary | ICD-10-CM | POA: Diagnosis not present

## 2018-06-01 DIAGNOSIS — I2510 Atherosclerotic heart disease of native coronary artery without angina pectoris: Secondary | ICD-10-CM | POA: Insufficient documentation

## 2018-06-01 DIAGNOSIS — R197 Diarrhea, unspecified: Secondary | ICD-10-CM | POA: Diagnosis not present

## 2018-06-01 DIAGNOSIS — K219 Gastro-esophageal reflux disease without esophagitis: Secondary | ICD-10-CM | POA: Diagnosis not present

## 2018-06-01 DIAGNOSIS — E785 Hyperlipidemia, unspecified: Secondary | ICD-10-CM | POA: Insufficient documentation

## 2018-06-01 DIAGNOSIS — M1990 Unspecified osteoarthritis, unspecified site: Secondary | ICD-10-CM | POA: Insufficient documentation

## 2018-06-01 DIAGNOSIS — R634 Abnormal weight loss: Secondary | ICD-10-CM | POA: Diagnosis not present

## 2018-06-01 DIAGNOSIS — E039 Hypothyroidism, unspecified: Secondary | ICD-10-CM | POA: Insufficient documentation

## 2018-06-01 DIAGNOSIS — I10 Essential (primary) hypertension: Secondary | ICD-10-CM | POA: Diagnosis not present

## 2018-06-01 DIAGNOSIS — Z79899 Other long term (current) drug therapy: Secondary | ICD-10-CM | POA: Diagnosis not present

## 2018-06-01 DIAGNOSIS — M797 Fibromyalgia: Secondary | ICD-10-CM | POA: Diagnosis not present

## 2018-06-01 DIAGNOSIS — C3491 Malignant neoplasm of unspecified part of right bronchus or lung: Secondary | ICD-10-CM | POA: Insufficient documentation

## 2018-06-01 DIAGNOSIS — Z7982 Long term (current) use of aspirin: Secondary | ICD-10-CM | POA: Insufficient documentation

## 2018-06-01 DIAGNOSIS — R5383 Other fatigue: Secondary | ICD-10-CM | POA: Diagnosis not present

## 2018-06-01 MED ORDER — PEGFILGRASTIM INJECTION 6 MG/0.6ML ~~LOC~~
PREFILLED_SYRINGE | SUBCUTANEOUS | Status: AC
  Filled 2018-06-01: qty 0.6

## 2018-06-01 MED ORDER — PEGFILGRASTIM-CBQV 6 MG/0.6ML ~~LOC~~ SOSY: 6.0000 mg | PREFILLED_SYRINGE | Freq: Once | SUBCUTANEOUS | Status: DC

## 2018-06-01 MED ORDER — PEGFILGRASTIM INJECTION 6 MG/0.6ML ~~LOC~~: 6.0000 mg | PREFILLED_SYRINGE | Freq: Once | SUBCUTANEOUS | Status: DC

## 2018-06-01 MED ORDER — PEGFILGRASTIM INJECTION 6 MG/0.6ML ~~LOC~~
6.0000 mg | PREFILLED_SYRINGE | Freq: Once | SUBCUTANEOUS | Status: AC
  Administered 2018-06-01: 6 mg via SUBCUTANEOUS

## 2018-06-01 NOTE — Telephone Encounter (Signed)
Scheduled appt per 4/1 sch message - pt aware of appt date and time

## 2018-06-01 NOTE — Addendum Note (Signed)
Addended by: Delane Ginger on: 06/01/2018 12:44 PM   Modules accepted: Orders

## 2018-06-05 ENCOUNTER — Other Ambulatory Visit: Payer: Self-pay | Admitting: Internal Medicine

## 2018-06-06 ENCOUNTER — Other Ambulatory Visit: Payer: Managed Care, Other (non HMO)

## 2018-06-07 ENCOUNTER — Other Ambulatory Visit: Payer: Self-pay

## 2018-06-07 ENCOUNTER — Inpatient Hospital Stay: Payer: Managed Care, Other (non HMO)

## 2018-06-07 ENCOUNTER — Other Ambulatory Visit: Payer: Self-pay | Admitting: *Deleted

## 2018-06-07 DIAGNOSIS — C3491 Malignant neoplasm of unspecified part of right bronchus or lung: Secondary | ICD-10-CM | POA: Diagnosis not present

## 2018-06-07 DIAGNOSIS — C3492 Malignant neoplasm of unspecified part of left bronchus or lung: Secondary | ICD-10-CM

## 2018-06-07 LAB — CMP (CANCER CENTER ONLY)
ALT: 21 U/L (ref 0–44)
AST: 17 U/L (ref 15–41)
Albumin: 4.1 g/dL (ref 3.5–5.0)
Alkaline Phosphatase: 139 U/L — ABNORMAL HIGH (ref 38–126)
Anion gap: 9 (ref 5–15)
BUN: 17 mg/dL (ref 8–23)
CO2: 27 mmol/L (ref 22–32)
Calcium: 9.2 mg/dL (ref 8.9–10.3)
Chloride: 104 mmol/L (ref 98–111)
Creatinine: 0.83 mg/dL (ref 0.44–1.00)
GFR, Est AFR Am: >60 mL/min (ref >60)
GFR, Estimated: >60 mL/min (ref >60)
Glucose, Bld: 91 mg/dL (ref 70–99)
Potassium: 3.5 mmol/L (ref 3.5–5.1)
Sodium: 140 mmol/L (ref 135–145)
Total Bilirubin: 0.4 mg/dL (ref 0.3–1.2)
Total Protein: 7.1 g/dL (ref 6.5–8.1)

## 2018-06-07 LAB — CBC WITH DIFFERENTIAL (CANCER CENTER ONLY)
Abs Immature Granulocytes: 0.1 10*3/uL — ABNORMAL HIGH (ref 0.00–0.07)
Basophils Absolute: 0.1 10*3/uL (ref 0.0–0.1)
Basophils Relative: 1 %
Eosinophils Absolute: 0.2 10*3/uL (ref 0.0–0.5)
Eosinophils Relative: 2 %
HCT: 27.9 % — ABNORMAL LOW (ref 36.0–46.0)
Hemoglobin: 9.2 g/dL — ABNORMAL LOW (ref 12.0–15.0)
Immature Granulocytes: 1 %
Lymphocytes Relative: 15 %
Lymphs Abs: 1.5 10*3/uL (ref 0.7–4.0)
MCH: 30.8 pg (ref 26.0–34.0)
MCHC: 33 g/dL (ref 30.0–36.0)
MCV: 93.3 fL (ref 80.0–100.0)
Monocytes Absolute: 1.6 10*3/uL — ABNORMAL HIGH (ref 0.1–1.0)
Monocytes Relative: 16 %
Neutro Abs: 6.7 10*3/uL (ref 1.7–7.7)
Neutrophils Relative %: 65 %
Platelet Count: 144 10*3/uL — ABNORMAL LOW (ref 150–400)
RBC: 2.99 MIL/uL — ABNORMAL LOW (ref 3.87–5.11)
RDW: 15.9 % — ABNORMAL HIGH (ref 11.5–15.5)
WBC Count: 10.1 10*3/uL (ref 4.0–10.5)
nRBC: 0 % (ref 0.0–0.2)

## 2018-06-08 ENCOUNTER — Telehealth: Payer: Self-pay | Admitting: Medical Oncology

## 2018-06-08 NOTE — Telephone Encounter (Signed)
Faxed order for scan today

## 2018-06-09 ENCOUNTER — Other Ambulatory Visit: Payer: Self-pay | Admitting: Internal Medicine

## 2018-06-13 ENCOUNTER — Ambulatory Visit: Payer: Managed Care, Other (non HMO)

## 2018-06-13 ENCOUNTER — Other Ambulatory Visit: Payer: Self-pay | Admitting: Internal Medicine

## 2018-06-13 ENCOUNTER — Ambulatory Visit: Payer: Managed Care, Other (non HMO) | Admitting: Internal Medicine

## 2018-06-13 ENCOUNTER — Other Ambulatory Visit: Payer: Managed Care, Other (non HMO)

## 2018-06-14 ENCOUNTER — Telehealth: Payer: Self-pay | Admitting: Medical Oncology

## 2018-06-14 ENCOUNTER — Other Ambulatory Visit: Payer: Self-pay

## 2018-06-14 ENCOUNTER — Inpatient Hospital Stay: Payer: Managed Care, Other (non HMO)

## 2018-06-14 DIAGNOSIS — C3491 Malignant neoplasm of unspecified part of right bronchus or lung: Secondary | ICD-10-CM | POA: Diagnosis not present

## 2018-06-14 DIAGNOSIS — C3492 Malignant neoplasm of unspecified part of left bronchus or lung: Secondary | ICD-10-CM

## 2018-06-14 DIAGNOSIS — R5382 Chronic fatigue, unspecified: Secondary | ICD-10-CM

## 2018-06-14 LAB — CBC WITH DIFFERENTIAL (CANCER CENTER ONLY)
Abs Immature Granulocytes: 0.15 10*3/uL — ABNORMAL HIGH (ref 0.00–0.07)
Basophils Absolute: 0 10*3/uL (ref 0.0–0.1)
Basophils Relative: 0 %
Eosinophils Absolute: 0.4 10*3/uL (ref 0.0–0.5)
Eosinophils Relative: 4 %
HCT: 27.6 % — ABNORMAL LOW (ref 36.0–46.0)
Hemoglobin: 9.2 g/dL — ABNORMAL LOW (ref 12.0–15.0)
Immature Granulocytes: 2 %
Lymphocytes Relative: 21 %
Lymphs Abs: 1.9 10*3/uL (ref 0.7–4.0)
MCH: 31.3 pg (ref 26.0–34.0)
MCHC: 33.3 g/dL (ref 30.0–36.0)
MCV: 93.9 fL (ref 80.0–100.0)
Monocytes Absolute: 0.8 10*3/uL (ref 0.1–1.0)
Monocytes Relative: 9 %
Neutro Abs: 5.7 10*3/uL (ref 1.7–7.7)
Neutrophils Relative %: 64 %
Platelet Count: 95 10*3/uL — ABNORMAL LOW (ref 150–400)
RBC: 2.94 MIL/uL — ABNORMAL LOW (ref 3.87–5.11)
RDW: 17.2 % — ABNORMAL HIGH (ref 11.5–15.5)
WBC Count: 8.9 10*3/uL (ref 4.0–10.5)
nRBC: 0 % (ref 0.0–0.2)

## 2018-06-14 LAB — CMP (CANCER CENTER ONLY)
ALT: 18 U/L (ref 0–44)
AST: 15 U/L (ref 15–41)
Albumin: 3.8 g/dL (ref 3.5–5.0)
Alkaline Phosphatase: 114 U/L (ref 38–126)
Anion gap: 10 (ref 5–15)
BUN: 11 mg/dL (ref 8–23)
CO2: 26 mmol/L (ref 22–32)
Calcium: 9.1 mg/dL (ref 8.9–10.3)
Chloride: 105 mmol/L (ref 98–111)
Creatinine: 0.8 mg/dL (ref 0.44–1.00)
GFR, Est AFR Am: >60 mL/min (ref >60)
GFR, Estimated: >60 mL/min (ref >60)
Glucose, Bld: 104 mg/dL — ABNORMAL HIGH (ref 70–99)
Potassium: 4.5 mmol/L (ref 3.5–5.1)
Sodium: 141 mmol/L (ref 135–145)
Total Bilirubin: 0.3 mg/dL (ref 0.3–1.2)
Total Protein: 7 g/dL (ref 6.5–8.1)

## 2018-06-14 LAB — TSH: TSH: 0.544 u[IU]/mL (ref 0.308–3.960)

## 2018-06-14 NOTE — Telephone Encounter (Signed)
Breathing-I was called to lobby . Pt got up quickly out of her chair and said  her PCP won't see her .She stated she  is having "wheezing and sob". Pt inhaled deeply and said "You hear it?". Pt in no acute distress , no audible wheezing . Her breathing is unlabored. Per Julien Nordmann I instructed her to continue using her inhalers and to call for temp 188for higher. I reviewed her labs with pt. NO other action taken.

## 2018-06-16 ENCOUNTER — Ambulatory Visit (HOSPITAL_COMMUNITY): Admission: RE | Admit: 2018-06-16 | Payer: Managed Care, Other (non HMO) | Source: Ambulatory Visit

## 2018-06-17 ENCOUNTER — Ambulatory Visit (HOSPITAL_COMMUNITY): Payer: Managed Care, Other (non HMO)

## 2018-06-20 ENCOUNTER — Inpatient Hospital Stay: Payer: Managed Care, Other (non HMO)

## 2018-06-20 ENCOUNTER — Other Ambulatory Visit: Payer: Managed Care, Other (non HMO)

## 2018-06-20 ENCOUNTER — Encounter: Payer: Self-pay | Admitting: Physician Assistant

## 2018-06-20 ENCOUNTER — Inpatient Hospital Stay (HOSPITAL_BASED_OUTPATIENT_CLINIC_OR_DEPARTMENT_OTHER): Payer: Managed Care, Other (non HMO) | Admitting: Physician Assistant

## 2018-06-20 ENCOUNTER — Other Ambulatory Visit: Payer: Self-pay

## 2018-06-20 VITALS — BP 127/78 | HR 89 | Temp 98.8°F | Resp 18 | Ht 66.0 in | Wt 152.2 lb

## 2018-06-20 DIAGNOSIS — I2510 Atherosclerotic heart disease of native coronary artery without angina pectoris: Secondary | ICD-10-CM

## 2018-06-20 DIAGNOSIS — M797 Fibromyalgia: Secondary | ICD-10-CM

## 2018-06-20 DIAGNOSIS — Z95828 Presence of other vascular implants and grafts: Secondary | ICD-10-CM

## 2018-06-20 DIAGNOSIS — R5383 Other fatigue: Secondary | ICD-10-CM | POA: Diagnosis not present

## 2018-06-20 DIAGNOSIS — M1990 Unspecified osteoarthritis, unspecified site: Secondary | ICD-10-CM

## 2018-06-20 DIAGNOSIS — Z7902 Long term (current) use of antithrombotics/antiplatelets: Secondary | ICD-10-CM

## 2018-06-20 DIAGNOSIS — E039 Hypothyroidism, unspecified: Secondary | ICD-10-CM

## 2018-06-20 DIAGNOSIS — R197 Diarrhea, unspecified: Secondary | ICD-10-CM

## 2018-06-20 DIAGNOSIS — K219 Gastro-esophageal reflux disease without esophagitis: Secondary | ICD-10-CM

## 2018-06-20 DIAGNOSIS — T887XXA Unspecified adverse effect of drug or medicament, initial encounter: Secondary | ICD-10-CM | POA: Insufficient documentation

## 2018-06-20 DIAGNOSIS — C3492 Malignant neoplasm of unspecified part of left bronchus or lung: Secondary | ICD-10-CM

## 2018-06-20 DIAGNOSIS — I10 Essential (primary) hypertension: Secondary | ICD-10-CM

## 2018-06-20 DIAGNOSIS — J449 Chronic obstructive pulmonary disease, unspecified: Secondary | ICD-10-CM

## 2018-06-20 DIAGNOSIS — C3490 Malignant neoplasm of unspecified part of unspecified bronchus or lung: Secondary | ICD-10-CM | POA: Diagnosis not present

## 2018-06-20 DIAGNOSIS — R531 Weakness: Secondary | ICD-10-CM | POA: Diagnosis not present

## 2018-06-20 DIAGNOSIS — G2401 Drug induced subacute dyskinesia: Secondary | ICD-10-CM | POA: Insufficient documentation

## 2018-06-20 DIAGNOSIS — E785 Hyperlipidemia, unspecified: Secondary | ICD-10-CM

## 2018-06-20 DIAGNOSIS — K589 Irritable bowel syndrome without diarrhea: Secondary | ICD-10-CM

## 2018-06-20 DIAGNOSIS — Z79899 Other long term (current) drug therapy: Secondary | ICD-10-CM

## 2018-06-20 DIAGNOSIS — R634 Abnormal weight loss: Secondary | ICD-10-CM

## 2018-06-20 DIAGNOSIS — K580 Irritable bowel syndrome with diarrhea: Secondary | ICD-10-CM

## 2018-06-20 DIAGNOSIS — C3491 Malignant neoplasm of unspecified part of right bronchus or lung: Secondary | ICD-10-CM

## 2018-06-20 DIAGNOSIS — Z5111 Encounter for antineoplastic chemotherapy: Secondary | ICD-10-CM

## 2018-06-20 DIAGNOSIS — Z7982 Long term (current) use of aspirin: Secondary | ICD-10-CM

## 2018-06-20 LAB — CBC WITH DIFFERENTIAL (CANCER CENTER ONLY)
Abs Immature Granulocytes: 0.04 10*3/uL (ref 0.00–0.07)
Basophils Absolute: 0 10*3/uL (ref 0.0–0.1)
Basophils Relative: 1 %
Eosinophils Absolute: 0.2 10*3/uL (ref 0.0–0.5)
Eosinophils Relative: 3 %
HCT: 29.1 % — ABNORMAL LOW (ref 36.0–46.0)
Hemoglobin: 9.6 g/dL — ABNORMAL LOW (ref 12.0–15.0)
Immature Granulocytes: 1 %
Lymphocytes Relative: 19 %
Lymphs Abs: 1.2 10*3/uL (ref 0.7–4.0)
MCH: 31.3 pg (ref 26.0–34.0)
MCHC: 33 g/dL (ref 30.0–36.0)
MCV: 94.8 fL (ref 80.0–100.0)
Monocytes Absolute: 0.7 10*3/uL (ref 0.1–1.0)
Monocytes Relative: 11 %
Neutro Abs: 4.2 10*3/uL (ref 1.7–7.7)
Neutrophils Relative %: 65 %
Platelet Count: 230 10*3/uL (ref 150–400)
RBC: 3.07 MIL/uL — ABNORMAL LOW (ref 3.87–5.11)
RDW: 19.5 % — ABNORMAL HIGH (ref 11.5–15.5)
WBC Count: 6.4 10*3/uL (ref 4.0–10.5)
nRBC: 0 % (ref 0.0–0.2)

## 2018-06-20 LAB — CMP (CANCER CENTER ONLY)
ALT: 217 U/L — ABNORMAL HIGH (ref 0–44)
AST: 278 U/L (ref 15–41)
Albumin: 3.7 g/dL (ref 3.5–5.0)
Alkaline Phosphatase: 172 U/L — ABNORMAL HIGH (ref 38–126)
Anion gap: 8 (ref 5–15)
BUN: 15 mg/dL (ref 8–23)
CO2: 27 mmol/L (ref 22–32)
Calcium: 9.2 mg/dL (ref 8.9–10.3)
Chloride: 106 mmol/L (ref 98–111)
Creatinine: 0.83 mg/dL (ref 0.44–1.00)
GFR, Est AFR Am: >60 mL/min (ref >60)
GFR, Estimated: >60 mL/min (ref >60)
Glucose, Bld: 106 mg/dL — ABNORMAL HIGH (ref 70–99)
Potassium: 4.3 mmol/L (ref 3.5–5.1)
Sodium: 141 mmol/L (ref 135–145)
Total Bilirubin: 0.5 mg/dL (ref 0.3–1.2)
Total Protein: 6.8 g/dL (ref 6.5–8.1)

## 2018-06-20 MED ORDER — PALONOSETRON HCL INJECTION 0.25 MG/5ML
INTRAVENOUS | Status: AC
  Filled 2018-06-20: qty 5

## 2018-06-20 MED ORDER — PEGFILGRASTIM 6 MG/0.6ML ~~LOC~~ PSKT
PREFILLED_SYRINGE | SUBCUTANEOUS | Status: AC
  Filled 2018-06-20: qty 0.6

## 2018-06-20 MED ORDER — SODIUM CHLORIDE 0.9 % IV SOLN
480.0000 mg | Freq: Once | INTRAVENOUS | Status: AC
  Administered 2018-06-20: 14:00:00 480 mg via INTRAVENOUS
  Filled 2018-06-20: qty 48

## 2018-06-20 MED ORDER — SODIUM CHLORIDE 0.9% FLUSH
10.0000 mL | Freq: Once | INTRAVENOUS | Status: AC
  Administered 2018-06-20: 10 mL
  Filled 2018-06-20: qty 10

## 2018-06-20 MED ORDER — ONDANSETRON HCL 8 MG PO TABS: 8.0000 mg | ORAL_TABLET | Freq: Three times a day (TID) | ORAL | 0 refills | Status: DC | PRN

## 2018-06-20 MED ORDER — PEGFILGRASTIM 6 MG/0.6ML ~~LOC~~ PSKT
6.0000 mg | PREFILLED_SYRINGE | Freq: Once | SUBCUTANEOUS | Status: AC
  Administered 2018-06-20: 14:00:00 6 mg via SUBCUTANEOUS

## 2018-06-20 MED ORDER — SODIUM CHLORIDE 0.9 % IV SOLN
Freq: Once | INTRAVENOUS | Status: AC
  Administered 2018-06-20: 12:00:00 via INTRAVENOUS
  Filled 2018-06-20: qty 250

## 2018-06-20 MED ORDER — SODIUM CHLORIDE 0.9 % IV SOLN
Freq: Once | INTRAVENOUS | Status: AC
  Administered 2018-06-20: 12:00:00 via INTRAVENOUS
  Filled 2018-06-20: qty 5

## 2018-06-20 MED ORDER — PALONOSETRON HCL INJECTION 0.25 MG/5ML
0.2500 mg | Freq: Once | INTRAVENOUS | Status: AC
  Administered 2018-06-20: 12:00:00 0.25 mg via INTRAVENOUS

## 2018-06-20 MED ORDER — HEPARIN SOD (PORK) LOCK FLUSH 100 UNIT/ML IV SOLN
500.0000 [IU] | Freq: Once | INTRAVENOUS | Status: AC | PRN
  Administered 2018-06-20: 500 [IU]
  Filled 2018-06-20: qty 5

## 2018-06-20 MED ORDER — SODIUM CHLORIDE 0.9% FLUSH
10.0000 mL | INTRAVENOUS | Status: DC | PRN
  Administered 2018-06-20: 14:00:00 10 mL
  Filled 2018-06-20: qty 10

## 2018-06-20 MED ORDER — SODIUM CHLORIDE 0.9 % IV SOLN
476.0000 mg/m2 | Freq: Once | INTRAVENOUS | Status: AC
  Administered 2018-06-20: 900 mg via INTRAVENOUS
  Filled 2018-06-20: qty 20

## 2018-06-20 NOTE — Progress Notes (Signed)
Keep dose of carboplatin at 419m per Dr. MJulien Nordmanntoday. He does not want her to go over dose of 4885meven if AUC=5 dosing calculation is greater than this.   Today is last carboplatin dose in care plan.   MaDemetrius CharityPharmD, [facility] Pharmac[facility] Phone: [phone]/20/2020

## 2018-06-20 NOTE — Progress Notes (Signed)
[name] OFFICE PROGRESS NOTE  Diana Beals, NP [place] [address]  DIAGNOSIS: stage IV (T1a, N0, M1 a) non-small cell lung cancer, adenocarcinoma presented with multifocal disease in both lungs including 3 nodules in the left lung as well as 2 nodules in the right lung diagnosed in December 2019. No actionable mutations KRAS G12C BCOR [id number]-subclonal RAD21 amplification  PDL 1 expression 1%.  PRIOR THERAPY: None  CURRENT THERAPY: Systemic chemotherapy with carboplatin for AUC of 5, Alimta 500 mg/M2 and Keytruda 200 mg IV every 3 weeks. First dose April 12, 2018. Status post 3 cycles. Starting from cycle #3, Beryle Flock was dropped and patient received treatment with Carboplatin for an AUC of 5 and Alimta 500 mg/m2 IV every 3 weeks.   INTERVAL HISTORY: [name] 63 y.o. female returns to the clinic today for a follow-up visit. The patient was hospitalized after her second cycle of treatment due to an acute kidney injury secondary to dehydration from significant nausea, vomiting, and diarrhea.  Her GI viral panel, C. difficile, and CT of the abdomen were unremarkable. For the patient's third cycle of treatment, Beryle Flock was dropped. The patient states that she tolerated her third cycle of treatment much better except for fatigue and generalized weakness which still persists today. She also reports a 10 lb unintentional weight loss since her visit 3 weeks ago. She denies any changes to her dietary habits during this time. She drinks about 1 ensure a day. She denies any more nausea, vomiting, or diarrhea immediately following treatment since Keytruda was removed from her treatment plan.  However, she has developed diarrhea over the last 2 days.  She states that she has approximately 5-6 nonbloody, loose stools since symptom onset.  She started taking Imodium today which has significantly helped alleviate her symptoms. She denies any associated  abdominal pain, nausea, vomiting, or fevers.  She has a history of IBS with predominant diarrhea. She states her IBS is worse when she is stressed. She states that she feels anxious before her infusions considering she had to be hospitalized after the 2nd treatment.   Otherwise, the patient is feeling fair today. The patient denies any chills or night sweats.  She denies any chest pain or hemoptysis.  She reports slight worsening of her baseline shortness of breath and cough secondary to her allergies. She is using her inhalers to manage her shortness of breath.  She denies any headache or visual changes.  While speaking to the patient today, the patient has been experiencing distressing involuntary movements/protrusions of her tongue.  She states that this is been going on for the last few weeks.  She denies any other repetitive/involuntary movements such as grimacing, eye blinking, or any other spasms of the face/neck. She is prescribed compazine for nausea which she had been taking freqently after her first few cycles of treatment.  The patient recently had a restaging CT scan performed at [facility].  She is here today for evaluation and discussion of her scan results.   MEDICAL HISTORY: Past Medical History:  Diagnosis Date  . Anemia    "long time ago" (12/06/2012)  . Anxiety   . Arthritis    "right knee real bad; in my hands bad" (12/06/2012)  . Asthma   . Celiac disease   . Colon polyps   . COPD (chronic obstructive pulmonary disease) ([place])   . Coronary artery disease    a. s/p Xience DES to [facility] 04/2008;  b. LHC 05/2008: Proximal RCA 25%, mid RCA stent patent.  c. Anormal nuc 2014 -> s/p LHC with severe mRCA stenosis s/p DES. d. Cath 04/2013 s/p DES to LAD.  e. LHC 08/2015 was stable.  . Depression    "years ago" (12/06/2012)  . Fibromyalgia   . Gallstones   . GERD (gastroesophageal reflux disease)   . H/O hiatal hernia   . Hepatitis    "not A, B, or C" (12/06/2012)  .  Hyperlipidemia    intol to statins and other chol agents due to elevated LFTs  . Hypertension   . Hypothyroidism   . IBS (irritable bowel syndrome)   . Interstitial cystitis   . Lung cancer ([place])   . Lung nodules    Bilateral  . Migraines    "when I was younger" (12/06/2012)  . Pneumonia   . PONV (postoperative nausea and vomiting)    "Very bad"  . Premature atrial contractions   . PVC's (premature ventricular contractions)   . Sinus headache   . SVT (supraventricular tachycardia) ([facility])     ALLERGIES:  is allergic to ciprofloxacin; levofloxacin; statins; tricor [fenofibrate]; latex; codeine; and metoprolol.  MEDICATIONS:  Current Outpatient Medications  Medication Sig Dispense Refill  . albuterol (PROAIR HFA) 108 (90 Base) MCG/ACT inhaler Inhale 1 puff into the lungs every 6 (six) hours as needed (wheezing/shortness of breath.).     Marland Kitchen albuterol (PROVENTIL) (5 MG/ML) 0.5% nebulizer solution Take 2.5 mg by nebulization every 6 (six) hours as needed for wheezing.    Marland Kitchen amLODipine (NORVASC) 5 MG tablet Take 1.5 tablets (7.5 mg total) by mouth daily. (Patient taking differently: Take 7.5 mg by mouth every evening. ) 180 tablet 3  . aspirin 81 MG chewable tablet Chew 1 tablet (81 mg total) by mouth daily.    . BELSOMRA 20 MG TABS Take 20 mg by mouth at bedtime as needed for sleep.    Marland Kitchen buPROPion (WELLBUTRIN SR) 150 MG 12 hr tablet Take 150-300 mg by mouth See admin instructions. Take 2 tablets (300 mg) by mouth daily in the morning & take 1 tablet (150 mg) by mouth in the evening.    . clopidogrel (PLAVIX) 75 MG tablet Take 1 tablet (75 mg total) by mouth daily. MUST SCHEDULE APPOINTMENT FOR FURTHER REFILLS 30 tablet 0  . DULoxetine (CYMBALTA) 60 MG capsule Take 120 mg by mouth daily.    . ergocalciferol (VITAMIN D2) 50000 UNITS capsule Take 50,000 Units by mouth every Wednesday.     . folic acid (FOLVITE) 1 MG tablet Take 1 tablet (1 mg total) by mouth daily. 30 tablet 4  . gabapentin  (NEURONTIN) 100 MG capsule Take 100 mg by mouth daily.     Marland Kitchen HYDROcodone-acetaminophen (NORCO) 7.5-325 MG per tablet Take 1 tablet by mouth every 6 (six) hours as needed for pain.    . hydrocortisone (ANUSOL-HC) 25 MG suppository Place 1 suppository (25 mg total) rectally at bedtime. For one week and then as needed for hemorrhoids 12 suppository 1  . Hydrocortisone (GERHARDT'S BUTT CREAM) CREA Apply 1 application topically 4 (four) times daily. 1 each 0  . levothyroxine (SYNTHROID, LEVOTHROID) 25 MCG tablet Take 25 mcg by mouth daily before breakfast.    . lidocaine-prilocaine (EMLA) cream Apply 1 application topically as needed. 30 g 0  . loperamide (IMODIUM) 2 MG capsule Take 1 capsule (2 mg total) by mouth as needed for diarrhea or loose stools (Initiate if stool C diff -ve). 30 capsule 0  .  LORazepam (ATIVAN) 2 MG tablet Take 2 mg by mouth See admin instructions. Take 1 tablet (2 mg) by mouth scheduled at bedtime & take 1 tablet (2 mg) by mouth twice daily if needed for anxiety    . metoprolol tartrate (LOPRESSOR) 25 MG tablet Take 1 tablet (25 mg total) by mouth 2 (two) times daily. 180 tablet 3  . montelukast (SINGULAIR) 10 MG tablet Take 10 mg by mouth at bedtime.     . nitroGLYCERIN (NITROSTAT) 0.4 MG SL tablet Place 1 tablet (0.4 mg total) under the tongue every 5 (five) minutes as needed for chest pain. 25 tablet 12  . omeprazole (PRILOSEC) 40 MG capsule Take 1 tablet by mouth twice a day for 2 weeks then 1 tablet every morning thereafter 45 capsule 8  . ondansetron (ZOFRAN ODT) 4 MG disintegrating tablet Take 1 tablet (4 mg total) by mouth every 8 (eight) hours as needed for nausea or vomiting. 20 tablet 0  . ondansetron (ZOFRAN) 8 MG tablet Take 1 tablet (8 mg total) by mouth every 8 (eight) hours as needed for nausea or vomiting. 30 tablet 0  . prochlorperazine (COMPAZINE) 10 MG tablet TAKE 1 TABLET(10 MG) BY MOUTH EVERY 6 HOURS AS NEEDED FOR NAUSEA OR VOMITING 30 tablet 0  . SYMBICORT  160-4.5 MCG/ACT inhaler Take 2 puffs by mouth 2 (two) times daily.  3  . temazepam (RESTORIL) 15 MG capsule Take 15 mg by mouth at bedtime.     . vitamin B-12 100 MCG tablet Take 1 tablet (100 mcg total) by mouth daily. 30 tablet 1   No current facility-administered medications for this visit.    Facility-Administered Medications Ordered in Other Visits  Medication Dose Route Frequency Provider Last Rate Last Dose  . sodium chloride flush (NS) 0.9 % injection 10 mL  10 mL Intracatheter PRN Curt Bears, MD   10 mL at 06/20/18 1404    SURGICAL HISTORY:  Past Surgical History:  Procedure Laterality Date  . CARDIAC CATHETERIZATION  04/2008; 05/2008  . CARDIAC CATHETERIZATION N/A 08/07/2015   Procedure: Left Heart Cath and Coronary Angiography;  Surgeon: Sherren Mocha, MD;  Location: [facility] CV LAB;  Service: Cardiovascular;  Laterality: N/A;  . CHOLECYSTECTOMY    . CORONARY ANGIOPLASTY WITH STENT PLACEMENT  04/2008 12/06/2012   "1 + 1" (12/06/2012)  . CORONARY STENT PLACEMENT  05/08/2013   DES TO LAD      DR MCALHANY  . IR IMAGING GUIDED PORT INSERTION  04/29/2018  . LEFT HEART CATHETERIZATION WITH CORONARY ANGIOGRAM N/A 12/06/2012   Procedure: LEFT HEART CATHETERIZATION WITH CORONARY ANGIOGRAM;  Surgeon: [name], MD;  Location: [facility] CATH LAB;  Service: Cardiovascular;  Laterality: N/A;  . LEFT HEART CATHETERIZATION WITH CORONARY ANGIOGRAM N/A 05/08/2013   Procedure: LEFT HEART CATHETERIZATION WITH CORONARY ANGIOGRAM;  Surgeon: [name], MD;  Location: [facility] CATH LAB;  Service: Cardiovascular;  Laterality: N/A;  . LEFT HEART CATHETERIZATION WITH CORONARY ANGIOGRAM N/A 01/04/2014   Procedure: LEFT HEART CATHETERIZATION WITH CORONARY ANGIOGRAM;  Surgeon: [name], MD;  Location: [facility] CATH LAB;  Service: Cardiovascular;  Laterality: N/A;  . PERCUTANEOUS CORONARY STENT INTERVENTION (PCI-S)  12/06/2012   Procedure: PERCUTANEOUS CORONARY STENT INTERVENTION  (PCI-S);  Surgeon: [name], MD;  Location: [facility] CATH LAB;  Service: Cardiovascular;;  . PERCUTANEOUS CORONARY STENT INTERVENTION (PCI-S)  05/08/2013   Procedure: PERCUTANEOUS CORONARY STENT INTERVENTION (PCI-S);  Surgeon: [name], MD;  Location: [facility] C[facility] LAB;  Service: Cardiovascular;;  mid  LAD   . TUBAL LIGATION    . VAGINAL HYSTERECTOMY    . VIDEO BRONCHOSCOPY WITH ENDOBRONCHIAL NAVIGATION N/A 02/17/2018   Procedure: VIDEO BRONCHOSCOPY WITH ENDOBRONCHIAL NAVIGATION;  Surgeon: Melrose Nakayama, MD;  Location: [place];  Service: Thoracic;  Laterality: N/A;    REVIEW OF SYSTEMS:   Review of Systems  Constitutional: Positive for fatigue, generalized weakness, and unexpected weight change. Negative for appetite change, chills, and fever. HENT:  Negative for mouth sores, nosebleeds, sore throat and trouble swallowing.   Eyes: Negative for eye problems and icterus.  Respiratory: Positive for cough and shortness of breath with exertion. Negative for cough, and hemoptysis.  Cardiovascular: Negative for chest pain and leg swelling.  Gastrointestinal: Positive for diarrhea. Negative for abdominal pain, constipation, nausea and vomiting.  Genitourinary: Negative for bladder incontinence, difficulty urinating, dysuria, frequency and hematuria.   Musculoskeletal: Negative for back pain, gait problem, neck pain and neck stiffness.  Skin: Negative for itching and rash.  Neurological: Negative for dizziness, extremity weakness, gait problem, headaches, light-headedness and seizures.  Hematological: Negative for adenopathy. Does not bruise/bleed easily.  Psychiatric/Behavioral: Negative for confusion, depression and sleep disturbance. The patient is not nervous/anxious.     PHYSICAL EXAMINATION:  Blood pressure 127/78, pulse 89, temperature 98.8 F (37.1 C), temperature source Oral, resp. rate 18, height _0  (1.676 m), weight 152 lb 3.2 oz (69 kg), SpO2 98 %.  ECOG  PERFORMANCE STATUS: 1 - Symptomatic but completely ambulatory  Physical Exam  Constitutional: Oriented to person, place, and time and well-developed, well-nourished, and in no distress.  HENT:  Head: Normocephalic and atraumatic.  Mouth/Throat: Oropharynx is clear and moist. No oropharyngeal exudate.  Eyes: Conjunctivae are normal. Right eye exhibits no discharge. Left eye exhibits no discharge. No scleral icterus.  Neck: Normal range of motion. Neck supple.  Cardiovascular: Normal rate, regular rhythm, normal heart sounds and intact distal pulses.   Pulmonary/Chest: Effort normal and breath sounds normal. No respiratory distress. No wheezes. No rales.  Abdominal: Soft. Bowel sounds are normal. Exhibits no distension and no mass. There is no tenderness.  Musculoskeletal: Normal range of motion. Exhibits no edema.  Lymphadenopathy:    No cervical adenopathy.  Neurological: Alert and oriented to person, place, and time. Exhibits normal muscle tone. Gait normal. Coordination normal.  Skin: Skin is warm and dry. No rash noted. Not diaphoretic. No erythema. No pallor.  Psychiatric: Mood, memory and judgment normal.  Vitals reviewed.  LABORATORY DATA: Lab Results  Component Value Date   WBC 6.4 06/20/2018   HGB 9.6 (L) 06/20/2018   HCT 29.1 (L) 06/20/2018   MCV 94.8 06/20/2018   PLT 230 06/20/2018      Chemistry      Component Value Date/Time   NA 141 06/20/2018 1030   NA 143 01/10/2018 1629   K 4.3 06/20/2018 1030   CL 106 06/20/2018 1030   CO2 27 06/20/2018 1030   BUN 15 06/20/2018 1030   BUN 14 01/10/2018 1629   CREATININE 0.83 06/20/2018 1030   CREATININE 0.68 08/05/2015 1456      Component Value Date/Time   CALCIUM 9.2 06/20/2018 1030   ALKPHOS 172 (H) 06/20/2018 1030   AST 278 (HH) 06/20/2018 1030   ALT 217 (H) 06/20/2018 1030   BILITOT 0.5 06/20/2018 1030       RADIOGRAPHIC STUDIES:  No results found.   ASSESSMENT/PLAN:  This is a very pleasant 63 year old  Caucasian female who was recently diagnosed with stage IV non-small cell  lung cancer, adenocarcinoma with no actionable mutations and a PDL 1 expression of 1%.  She was diagnosed in December 2019 with bilateral pulmonary nodules. She is currently undergoing treatment with carboplatin for an AUC of 5, Alimta 500 mg/m, and Keytruda 200 mg IV every 3 weeks.  After cycle #2, Keytruda was dropped due to intolerability.  The patient is status post 3 cycles.  The patient has been tolerating treatment much better since Keytruda was removed from her treatment plan except for fatigue and generalized weakness.  The patient had a restaging CT scan performed at [facility].  The patient was seen with Dr. Julien Nordmann today who discussed the scan results with the patient.  The scan showed stable disease.  Dr. Julien Nordmann recommends that the patient proceed with treatment today as scheduled.   The patient's LFTs were elevated today.  The patient states that she has a history of an unspecified liver problem.  Per chart review, hepatitis is listed in her past diagnoses.  The etiology of her hepatitis is unclear. She will proceed with treatment today scheduled with Carboplatin and Alimta. She is no longer receiving treatment with Keytruda, which can cause/exacerbate liver inflammation.  We will continue to monitor her CMP weekly.   I will see the patient back in 3 weeks for evaluation before starting cycle #5.   For the patient's diarrhea, she will continue taking Imodium for her symptoms.   For the patient's unintentional weight loss, the patient is interested in speaking to our nutritionist.  I will arrange for a referral to the nutritionist. She was encouraged to continue increasing her oral intake in the meantime.   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  The patient was instructed to discontinue her Compazine.  I have sent  a prescription for Zofran to the patient's pharmacy for nausea.   The patient was advised to call immediately if she has any concerning symptoms in the interval. The patient voices understanding of current disease status and treatment options and is in agreement with the current care plan. All questions were answered. The patient knows to call the clinic with any problems, questions or concerns. We can certainly see the patient much sooner if necessary  Orders Placed This Encounter  Procedures  . Ambulatory referral to Nutrition and Diabetic E    Referral Priority:   Routine    Referral Type:   Consultation    Referral Reason:   Patient Preference    Number of Visits Requested:   [place], PA-C 06/20/18  ADDENDUM: Hematology/Oncology Attending: I had a face-to-face encounter with the patient today.  I recommended her care plan.  This is a very pleasant 63 years old white female with static non-small cell lung cancer, adenocarcinoma.  She was started initially on systemic chemotherapy with carboplatin, Alimta and status post 2 cycles but she has a rough time tolerating with increasing diarrhea.  Beryle Flock was discontinued and the patient completed cycle #3 with just carboplatin and Alimta and she tolerated much better.   She had repeat CT scan of the chest, abdomen and pelvis performed recently [facility].  Unfortunately the scan was not compared to her previous imaging studies and and that showed bilateral pulmonary nodules. I recommended for the patient to continue her current treatment with carboplatin and Alimta for cycle #4 by maintenance Alimta. The patient will come back for follow-up visit  in 3 weeks for evaluation before starting cycle #5. She was advised to call immediately if she has any concerning symptoms in the interval.  Disclaimer: This note was dictated with voice recognition software. Similar sounding words can inadvertently be  transcribed and may be missed upon review. Eilleen Kempf, MD 06/20/18

## 2018-06-20 NOTE — Patient Instructions (Signed)
Coronavirus (COVID-19) Are you at risk?  Are you at risk for the Coronavirus (COVID-19)?  To be considered HIGH RISK for Coronavirus (COVID-19), you have to meet the following criteria:  . Traveled to [place], [place], [place], [place] or [place]; or in the [place] to [place], [place], [place], or [place]; and have fever, cough, and shortness of breath within the last 2 weeks of travel OR . Been in close contact with a person diagnosed with COVID-19 within the last 2 weeks and have fever, cough, and shortness of breath . IF YOU DO NOT MEET THESE CRITERIA, YOU ARE CONSIDERED LOW RISK FOR COVID-19.  What to do if you are HIGH RISK for COVID-19?  Marland Kitchen If you are having a medical emergency, call 911. . Seek medical care right away. Before you go to a doctor's office, urgent care or emergency department, call ahead and tell them about your recent travel, contact with someone diagnosed with COVID-19, and your symptoms. You should receive instructions from your physician's office regarding next steps of care.  . When you arrive at healthcare provider, tell the healthcare staff immediately you have returned from visiting [place], [place], [place], [place] or [place]; or traveled in the [place] to [place], [place], [place], or [place]; in the last two weeks or you have been in close contact with a person diagnosed with COVID-19 in the last 2 weeks.   . Tell the health care staff about your symptoms: fever, cough and shortness of breath. . After you have been seen by a medical provider, you will be either: o Tested for (COVID-19) and discharged home on quarantine except to seek medical care if symptoms worsen, and asked to  - Stay home and avoid contact with others until you get your results (4-5 days)  - Avoid travel on public transportation if possible (such as bus, train, or airplane) or o Sent to the Emergency Department by EMS for evaluation, COVID-19 testing, and possible  admission depending on your condition and test results.  What to do if you are LOW RISK for COVID-19?  Reduce your risk of any infection by using the same precautions used for avoiding the common cold or flu:  Marland Kitchen Wash your hands often with soap and warm water for at least 20 seconds.  If soap and water are not readily available, use an alcohol-based hand sanitizer with at least 60% alcohol.  . If coughing or sneezing, cover your mouth and nose by coughing or sneezing into the elbow areas of your shirt or coat, into a tissue or into your sleeve (not your hands). . Avoid shaking hands with others and consider head nods or verbal greetings only. . Avoid touching your eyes, nose, or mouth with unwashed hands.  . Avoid close contact with people who are sick. . Avoid places or events with large numbers of people in one location, like concerts or sporting events. . Carefully consider travel plans you have or are making. . If you are planning any travel outside or inside the [place], visit the CDC's Travelers' Health webpage for the latest health notices. . If you have some symptoms but not all symptoms, continue to monitor at home and seek medical attention if your symptoms worsen. . If you are having a medical emergency, call 911.   [place] / e-Visit: [email or web address]         [facility]: [place]  [facility]: [place] [facility]: [place] Discharge Instructions for Patients Receiving Chemotherapy  Today you received the following chemotherapy agents Alimta and Carboplatin   To help prevent nausea and vomiting after your treatment, we encourage you to take your nausea medication as directed.   If you develop nausea and vomiting that is not controlled by your nausea medication, call the clinic.    BELOW ARE SYMPTOMS THAT SHOULD BE REPORTED IMMEDIATELY:  *FEVER GREATER THAN 100.5 F  *CHILLS WITH OR WITHOUT FEVER  NAUSEA AND VOMITING THAT IS NOT CONTROLLED WITH YOUR NAUSEA MEDICATION  *UNUSUAL SHORTNESS OF BREATH  *UNUSUAL BRUISING OR BLEEDING  TENDERNESS IN MOUTH AND THROAT WITH OR WITHOUT PRESENCE OF ULCERS  *URINARY PROBLEMS  *BOWEL PROBLEMS  UNUSUAL RASH Items with * indicate a potential emergency and should be followed up as soon as possible.  Feel free to call the clinic should you have any questions or concerns. The clinic phone number is [phone].  Please show the [place] at check-in to the Emergency Department and triage nurse.

## 2018-06-20 NOTE — Progress Notes (Signed)
Ok to tx with elevated AST & ALT per Cassie PA.

## 2018-06-21 ENCOUNTER — Telehealth: Payer: Self-pay | Admitting: Nutrition

## 2018-06-21 ENCOUNTER — Telehealth: Payer: Self-pay | Admitting: Physician Assistant

## 2018-06-21 NOTE — Telephone Encounter (Signed)
Called regarding additions to schedule

## 2018-06-21 NOTE — Telephone Encounter (Signed)
Scheduled nutrition appt per sch msg. Marked as phone visit. Called patient. No answer. Left msg.

## 2018-06-23 ENCOUNTER — Telehealth: Payer: Self-pay

## 2018-06-23 NOTE — Telephone Encounter (Signed)
Neulasta Onpro Patient Outreach Note  Patient was contacted on 06/23/2018 in regards to switching G-CSF therapy to Neulasta Onpro ([email or web address]). Patient was educated on the purpose of this proposed change in therapy due to COVID-19 pandemic. Patient was educated about Neulasta Onpro on-body injector and patient will be provided with an educational video while in infusion on their next scheduled date if changed to [place].  Patient states she had the Onpro after her last treatment and is very happy with it.  [x]  Patient agrees to change in therapy. Begin process to change to Neulasta Onpro therapy.  []  Patient does not agree to change in therapy. No change to Neulasta Onpro at this time.    Thank You,  Kerri Perches  06/23/2018 11:25 AM

## 2018-06-27 ENCOUNTER — Other Ambulatory Visit: Payer: Managed Care, Other (non HMO)

## 2018-06-28 ENCOUNTER — Inpatient Hospital Stay: Payer: Managed Care, Other (non HMO) | Admitting: Nutrition

## 2018-06-28 ENCOUNTER — Other Ambulatory Visit: Payer: Self-pay

## 2018-06-28 ENCOUNTER — Inpatient Hospital Stay: Payer: Managed Care, Other (non HMO)

## 2018-06-28 DIAGNOSIS — C3492 Malignant neoplasm of unspecified part of left bronchus or lung: Secondary | ICD-10-CM

## 2018-06-28 DIAGNOSIS — C3491 Malignant neoplasm of unspecified part of right bronchus or lung: Secondary | ICD-10-CM | POA: Diagnosis not present

## 2018-06-28 LAB — CMP (CANCER CENTER ONLY)
ALT: 36 U/L (ref 0–44)
AST: 20 U/L (ref 15–41)
Albumin: 3.9 g/dL (ref 3.5–5.0)
Alkaline Phosphatase: 127 U/L — ABNORMAL HIGH (ref 38–126)
Anion gap: 11 (ref 5–15)
BUN: 15 mg/dL (ref 8–23)
CO2: 25 mmol/L (ref 22–32)
Calcium: 9.1 mg/dL (ref 8.9–10.3)
Chloride: 104 mmol/L (ref 98–111)
Creatinine: 0.93 mg/dL (ref 0.44–1.00)
GFR, Est AFR Am: >60 mL/min (ref >60)
GFR, Estimated: >60 mL/min (ref >60)
Glucose, Bld: 111 mg/dL — ABNORMAL HIGH (ref 70–99)
Potassium: 4.4 mmol/L (ref 3.5–5.1)
Sodium: 140 mmol/L (ref 135–145)
Total Bilirubin: 0.4 mg/dL (ref 0.3–1.2)
Total Protein: 6.7 g/dL (ref 6.5–8.1)

## 2018-06-28 LAB — CBC WITH DIFFERENTIAL (CANCER CENTER ONLY)
Abs Immature Granulocytes: 0.03 10*3/uL (ref 0.00–0.07)
Basophils Absolute: 0 10*3/uL (ref 0.0–0.1)
Basophils Relative: 1 %
Eosinophils Absolute: 0.1 10*3/uL (ref 0.0–0.5)
Eosinophils Relative: 4 %
HCT: 24.5 % — ABNORMAL LOW (ref 36.0–46.0)
Hemoglobin: 8.1 g/dL — ABNORMAL LOW (ref 12.0–15.0)
Immature Granulocytes: 1 %
Lymphocytes Relative: 36 %
Lymphs Abs: 1 10*3/uL (ref 0.7–4.0)
MCH: 31.9 pg (ref 26.0–34.0)
MCHC: 33.1 g/dL (ref 30.0–36.0)
MCV: 96.5 fL (ref 80.0–100.0)
Monocytes Absolute: 0.4 10*3/uL (ref 0.1–1.0)
Monocytes Relative: 14 %
Neutro Abs: 1.2 10*3/uL — ABNORMAL LOW (ref 1.7–7.7)
Neutrophils Relative %: 44 %
Platelet Count: 73 10*3/uL — ABNORMAL LOW (ref 150–400)
RBC: 2.54 MIL/uL — ABNORMAL LOW (ref 3.87–5.11)
RDW: 17.9 % — ABNORMAL HIGH (ref 11.5–15.5)
WBC Count: 2.8 10*3/uL — ABNORMAL LOW (ref 4.0–10.5)
nRBC: 0 % (ref 0.0–0.2)

## 2018-06-28 NOTE — Progress Notes (Signed)
RD working remotely.  63 year old female diagnosed with stage IV NSCLC in December 2019.  PMH include IBS, Anxiety, Celiac Disease, COPD, CAD, GERD, HLD, and HTN.  Medications include Wellbutrin, Cymbalta, Vitamin D2, Folvite, Synthroid, Immodium, Ativan,  Prilosec, Zofran, Compazine, and Vitamin B12.  Labs include Glucose 106 on April 20.  Height: 5'6". Weight 152.2 pounds on April 20. UBW: 166 pounds in March 2020. BMI: 24.57.  Patient reports Nausea, vomiting and dry heaves for one week after chemotherapy. She states she takes nausea medications but they are not helping the dry heaves. She has Diarrhea she treats with immodium. Also complains of taste alterations. She is most bothered by weakness. She drinks one to two ensure daily.  Nutrition Diagnosis: Unintended weight loss related to Stage IV lung cancer as evidenced by 8% weight loss in ~ 2 months.  Intervention: Educated patient to consume small frequent meals and snacks. Increase Ensure Plus to TID between meals. Consume high calorie and high protein foods. Educated on strategies for eating with nausea and vomiting. Encouraged adequate fluid intake. Questions answered. Will email fact sheets to patient with name and contact information.  Monitoring, Evaluation, Goals: Patient will increase calories and protein to minimize weight loss.  Next Visit: Tuesday, June 2, during chemotherapy.

## 2018-07-04 ENCOUNTER — Ambulatory Visit: Payer: Managed Care, Other (non HMO)

## 2018-07-04 ENCOUNTER — Ambulatory Visit: Payer: Managed Care, Other (non HMO) | Admitting: Internal Medicine

## 2018-07-04 ENCOUNTER — Other Ambulatory Visit: Payer: Managed Care, Other (non HMO)

## 2018-07-05 ENCOUNTER — Inpatient Hospital Stay: Payer: Managed Care, Other (non HMO)

## 2018-07-05 ENCOUNTER — Other Ambulatory Visit: Payer: Self-pay | Admitting: Medical Oncology

## 2018-07-07 ENCOUNTER — Other Ambulatory Visit: Payer: Self-pay | Admitting: Medical Oncology

## 2018-07-07 DIAGNOSIS — C3492 Malignant neoplasm of unspecified part of left bronchus or lung: Secondary | ICD-10-CM

## 2018-07-08 ENCOUNTER — Inpatient Hospital Stay: Payer: Managed Care, Other (non HMO) | Attending: Internal Medicine

## 2018-07-08 ENCOUNTER — Other Ambulatory Visit: Payer: Managed Care, Other (non HMO)

## 2018-07-08 DIAGNOSIS — E785 Hyperlipidemia, unspecified: Secondary | ICD-10-CM | POA: Insufficient documentation

## 2018-07-08 DIAGNOSIS — E039 Hypothyroidism, unspecified: Secondary | ICD-10-CM | POA: Insufficient documentation

## 2018-07-08 DIAGNOSIS — R42 Dizziness and giddiness: Secondary | ICD-10-CM | POA: Insufficient documentation

## 2018-07-08 DIAGNOSIS — C3492 Malignant neoplasm of unspecified part of left bronchus or lung: Secondary | ICD-10-CM | POA: Insufficient documentation

## 2018-07-08 DIAGNOSIS — I2510 Atherosclerotic heart disease of native coronary artery without angina pectoris: Secondary | ICD-10-CM | POA: Insufficient documentation

## 2018-07-08 DIAGNOSIS — C3491 Malignant neoplasm of unspecified part of right bronchus or lung: Secondary | ICD-10-CM | POA: Insufficient documentation

## 2018-07-08 DIAGNOSIS — I10 Essential (primary) hypertension: Secondary | ICD-10-CM | POA: Insufficient documentation

## 2018-07-08 DIAGNOSIS — R110 Nausea: Secondary | ICD-10-CM | POA: Insufficient documentation

## 2018-07-08 DIAGNOSIS — R5383 Other fatigue: Secondary | ICD-10-CM | POA: Insufficient documentation

## 2018-07-08 DIAGNOSIS — R634 Abnormal weight loss: Secondary | ICD-10-CM | POA: Insufficient documentation

## 2018-07-08 DIAGNOSIS — J449 Chronic obstructive pulmonary disease, unspecified: Secondary | ICD-10-CM | POA: Insufficient documentation

## 2018-07-08 DIAGNOSIS — R197 Diarrhea, unspecified: Secondary | ICD-10-CM | POA: Insufficient documentation

## 2018-07-08 DIAGNOSIS — Z79899 Other long term (current) drug therapy: Secondary | ICD-10-CM | POA: Insufficient documentation

## 2018-07-08 DIAGNOSIS — M797 Fibromyalgia: Secondary | ICD-10-CM | POA: Insufficient documentation

## 2018-07-08 DIAGNOSIS — R531 Weakness: Secondary | ICD-10-CM | POA: Insufficient documentation

## 2018-07-08 DIAGNOSIS — Z5111 Encounter for antineoplastic chemotherapy: Secondary | ICD-10-CM | POA: Insufficient documentation

## 2018-07-08 DIAGNOSIS — K219 Gastro-esophageal reflux disease without esophagitis: Secondary | ICD-10-CM | POA: Insufficient documentation

## 2018-07-08 DIAGNOSIS — M1990 Unspecified osteoarthritis, unspecified site: Secondary | ICD-10-CM | POA: Insufficient documentation

## 2018-07-08 DIAGNOSIS — F419 Anxiety disorder, unspecified: Secondary | ICD-10-CM | POA: Insufficient documentation

## 2018-07-08 DIAGNOSIS — Z7982 Long term (current) use of aspirin: Secondary | ICD-10-CM | POA: Insufficient documentation

## 2018-07-08 DIAGNOSIS — Z7902 Long term (current) use of antithrombotics/antiplatelets: Secondary | ICD-10-CM | POA: Insufficient documentation

## 2018-07-08 DIAGNOSIS — Z7951 Long term (current) use of inhaled steroids: Secondary | ICD-10-CM | POA: Insufficient documentation

## 2018-07-11 ENCOUNTER — Other Ambulatory Visit: Payer: Managed Care, Other (non HMO)

## 2018-07-12 ENCOUNTER — Inpatient Hospital Stay: Payer: Managed Care, Other (non HMO)

## 2018-07-12 ENCOUNTER — Other Ambulatory Visit: Payer: Managed Care, Other (non HMO)

## 2018-07-12 ENCOUNTER — Other Ambulatory Visit: Payer: Self-pay

## 2018-07-12 ENCOUNTER — Encounter: Payer: Self-pay | Admitting: Physician Assistant

## 2018-07-12 ENCOUNTER — Other Ambulatory Visit: Payer: Self-pay | Admitting: *Deleted

## 2018-07-12 ENCOUNTER — Ambulatory Visit: Payer: Managed Care, Other (non HMO)

## 2018-07-12 ENCOUNTER — Inpatient Hospital Stay (HOSPITAL_BASED_OUTPATIENT_CLINIC_OR_DEPARTMENT_OTHER): Payer: Managed Care, Other (non HMO) | Admitting: Physician Assistant

## 2018-07-12 VITALS — BP 117/71 | HR 76 | Temp 98.9°F | Resp 18

## 2018-07-12 VITALS — BP 123/69 | HR 99 | Temp 98.2°F | Resp 18 | Ht 66.0 in | Wt 153.8 lb

## 2018-07-12 DIAGNOSIS — Z79899 Other long term (current) drug therapy: Secondary | ICD-10-CM | POA: Diagnosis not present

## 2018-07-12 DIAGNOSIS — M1990 Unspecified osteoarthritis, unspecified site: Secondary | ICD-10-CM

## 2018-07-12 DIAGNOSIS — K219 Gastro-esophageal reflux disease without esophagitis: Secondary | ICD-10-CM

## 2018-07-12 DIAGNOSIS — R531 Weakness: Secondary | ICD-10-CM | POA: Diagnosis not present

## 2018-07-12 DIAGNOSIS — Z7982 Long term (current) use of aspirin: Secondary | ICD-10-CM | POA: Diagnosis not present

## 2018-07-12 DIAGNOSIS — E785 Hyperlipidemia, unspecified: Secondary | ICD-10-CM | POA: Diagnosis not present

## 2018-07-12 DIAGNOSIS — R5383 Other fatigue: Secondary | ICD-10-CM

## 2018-07-12 DIAGNOSIS — R197 Diarrhea, unspecified: Secondary | ICD-10-CM | POA: Diagnosis not present

## 2018-07-12 DIAGNOSIS — C3491 Malignant neoplasm of unspecified part of right bronchus or lung: Secondary | ICD-10-CM

## 2018-07-12 DIAGNOSIS — C3492 Malignant neoplasm of unspecified part of left bronchus or lung: Secondary | ICD-10-CM | POA: Diagnosis not present

## 2018-07-12 DIAGNOSIS — Z5111 Encounter for antineoplastic chemotherapy: Secondary | ICD-10-CM

## 2018-07-12 DIAGNOSIS — Z7902 Long term (current) use of antithrombotics/antiplatelets: Secondary | ICD-10-CM | POA: Diagnosis not present

## 2018-07-12 DIAGNOSIS — R110 Nausea: Secondary | ICD-10-CM

## 2018-07-12 DIAGNOSIS — I10 Essential (primary) hypertension: Secondary | ICD-10-CM | POA: Diagnosis not present

## 2018-07-12 DIAGNOSIS — M797 Fibromyalgia: Secondary | ICD-10-CM

## 2018-07-12 DIAGNOSIS — T451X5A Adverse effect of antineoplastic and immunosuppressive drugs, initial encounter: Secondary | ICD-10-CM

## 2018-07-12 DIAGNOSIS — Z7951 Long term (current) use of inhaled steroids: Secondary | ICD-10-CM

## 2018-07-12 DIAGNOSIS — R634 Abnormal weight loss: Secondary | ICD-10-CM | POA: Diagnosis not present

## 2018-07-12 DIAGNOSIS — I2510 Atherosclerotic heart disease of native coronary artery without angina pectoris: Secondary | ICD-10-CM | POA: Diagnosis not present

## 2018-07-12 DIAGNOSIS — F419 Anxiety disorder, unspecified: Secondary | ICD-10-CM

## 2018-07-12 DIAGNOSIS — E039 Hypothyroidism, unspecified: Secondary | ICD-10-CM | POA: Diagnosis not present

## 2018-07-12 DIAGNOSIS — D6481 Anemia due to antineoplastic chemotherapy: Secondary | ICD-10-CM

## 2018-07-12 DIAGNOSIS — Z95828 Presence of other vascular implants and grafts: Secondary | ICD-10-CM

## 2018-07-12 DIAGNOSIS — R42 Dizziness and giddiness: Secondary | ICD-10-CM | POA: Diagnosis not present

## 2018-07-12 DIAGNOSIS — J449 Chronic obstructive pulmonary disease, unspecified: Secondary | ICD-10-CM | POA: Diagnosis not present

## 2018-07-12 LAB — CMP (CANCER CENTER ONLY)
ALT: 35 U/L (ref 0–44)
AST: 17 U/L (ref 15–41)
Albumin: 3.9 g/dL (ref 3.5–5.0)
Alkaline Phosphatase: 99 U/L (ref 38–126)
Anion gap: 10 (ref 5–15)
BUN: 17 mg/dL (ref 8–23)
CO2: 26 mmol/L (ref 22–32)
Calcium: 8.9 mg/dL (ref 8.9–10.3)
Chloride: 107 mmol/L (ref 98–111)
Creatinine: 0.89 mg/dL (ref 0.44–1.00)
GFR, Est AFR Am: >60 mL/min (ref >60)
GFR, Estimated: >60 mL/min (ref >60)
Glucose, Bld: 97 mg/dL (ref 70–99)
Potassium: 4.1 mmol/L (ref 3.5–5.1)
Sodium: 143 mmol/L (ref 135–145)
Total Bilirubin: 0.3 mg/dL (ref 0.3–1.2)
Total Protein: 6.6 g/dL (ref 6.5–8.1)

## 2018-07-12 LAB — CBC WITH DIFFERENTIAL (CANCER CENTER ONLY)
Abs Immature Granulocytes: 0.06 10*3/uL (ref 0.00–0.07)
Basophils Absolute: 0 10*3/uL (ref 0.0–0.1)
Basophils Relative: 0 %
Eosinophils Absolute: 0 10*3/uL (ref 0.0–0.5)
Eosinophils Relative: 0 %
HCT: 22.9 % — ABNORMAL LOW (ref 36.0–46.0)
Hemoglobin: 7.2 g/dL — ABNORMAL LOW (ref 12.0–15.0)
Immature Granulocytes: 1 %
Lymphocytes Relative: 18 %
Lymphs Abs: 1.3 10*3/uL (ref 0.7–4.0)
MCH: 33.3 pg (ref 26.0–34.0)
MCHC: 31.4 g/dL (ref 30.0–36.0)
MCV: 106 fL — ABNORMAL HIGH (ref 80.0–100.0)
Monocytes Absolute: 0.7 10*3/uL (ref 0.1–1.0)
Monocytes Relative: 9 %
Neutro Abs: 5.1 10*3/uL (ref 1.7–7.7)
Neutrophils Relative %: 72 %
Platelet Count: 245 10*3/uL (ref 150–400)
RBC: 2.16 MIL/uL — ABNORMAL LOW (ref 3.87–5.11)
RDW: 23.1 % — ABNORMAL HIGH (ref 11.5–15.5)
WBC Count: 7.1 10*3/uL (ref 4.0–10.5)
nRBC: 0 % (ref 0.0–0.2)

## 2018-07-12 LAB — SAMPLE TO BLOOD BANK

## 2018-07-12 LAB — PREPARE RBC (CROSSMATCH)

## 2018-07-12 MED ORDER — SODIUM CHLORIDE 0.9 % IV SOLN
476.0000 mg/m2 | Freq: Once | INTRAVENOUS | Status: AC
  Administered 2018-07-12: 13:00:00 900 mg via INTRAVENOUS
  Filled 2018-07-12: qty 20

## 2018-07-12 MED ORDER — DIPHENHYDRAMINE HCL 25 MG PO CAPS
ORAL_CAPSULE | ORAL | Status: AC
  Filled 2018-07-12: qty 1

## 2018-07-12 MED ORDER — SODIUM CHLORIDE 0.9% FLUSH
10.0000 mL | Freq: Once | INTRAVENOUS | Status: AC
  Administered 2018-07-12: 10 mL
  Filled 2018-07-12: qty 10

## 2018-07-12 MED ORDER — DIPHENHYDRAMINE HCL 25 MG PO CAPS
25.0000 mg | ORAL_CAPSULE | Freq: Once | ORAL | Status: AC
  Administered 2018-07-12: 25 mg via ORAL

## 2018-07-12 MED ORDER — CYANOCOBALAMIN 1000 MCG/ML IJ SOLN
INTRAMUSCULAR | Status: AC
  Filled 2018-07-12: qty 1

## 2018-07-12 MED ORDER — SODIUM CHLORIDE 0.9% FLUSH
10.0000 mL | INTRAVENOUS | Status: DC | PRN
  Administered 2018-07-12: 16:00:00 10 mL
  Filled 2018-07-12: qty 10

## 2018-07-12 MED ORDER — HEPARIN SOD (PORK) LOCK FLUSH 100 UNIT/ML IV SOLN
500.0000 [IU] | Freq: Once | INTRAVENOUS | Status: AC | PRN
  Administered 2018-07-12: 16:00:00 500 [IU]
  Filled 2018-07-12: qty 5

## 2018-07-12 MED ORDER — PROCHLORPERAZINE MALEATE 10 MG PO TABS
ORAL_TABLET | ORAL | Status: AC
  Filled 2018-07-12: qty 1

## 2018-07-12 MED ORDER — ACETAMINOPHEN 325 MG PO TABS
ORAL_TABLET | ORAL | Status: AC
  Filled 2018-07-12: qty 2

## 2018-07-12 MED ORDER — PROCHLORPERAZINE MALEATE 10 MG PO TABS
10.0000 mg | ORAL_TABLET | Freq: Once | ORAL | Status: AC
  Administered 2018-07-12: 12:00:00 10 mg via ORAL

## 2018-07-12 MED ORDER — ACETAMINOPHEN 325 MG PO TABS
650.0000 mg | ORAL_TABLET | Freq: Once | ORAL | Status: AC
  Administered 2018-07-12: 13:00:00 650 mg via ORAL

## 2018-07-12 MED ORDER — SODIUM CHLORIDE 0.9 % IV SOLN
Freq: Once | INTRAVENOUS | Status: AC
  Administered 2018-07-12: 12:00:00 via INTRAVENOUS
  Filled 2018-07-12: qty 250

## 2018-07-12 MED ORDER — CYANOCOBALAMIN 1000 MCG/ML IJ SOLN
1000.0000 ug | Freq: Once | INTRAMUSCULAR | Status: AC
  Administered 2018-07-12: 13:00:00 1000 ug via INTRAMUSCULAR

## 2018-07-12 NOTE — Progress Notes (Signed)
[facility] OFFICE PROGRESS NOTE  [name], NP [place] [address]  DIAGNOSIS:  stage IV (T1a, N0, M1 a) non-small cell lung cancer, adenocarcinoma presented with multifocal disease in both lungs including 3 nodules in the left lung as well as 2 nodules in the right lung diagnosed in December 2019. No actionable mutations KRAS G12C BCOR N1425S-subclonal RAD21 amplification  PDL 1 expression 1%.  PRIOR THERAPY: None   CURRENT THERAPY: Systemic chemotherapy with carboplatin for AUC of 5, Alimta 500 mg/M2 and Keytruda 200 mg IV every 3 weeks. First dose April 12, 2018.Status post 4 cycles.Starting from cycle #3, Beryle Flock was dropped and patient received treatment with Carboplatin for an AUC of 5 and Alimta 500 mg/m2 IV every 3 weeks. Starting from cycle #5, the patient will be on maintenance Alimta 500 mg/m2  INTERVAL HISTORY: [name] 63 y.o. female returns to clinic for a follow-up visit today.  The patient is feeling fair today without any concerning complaints except for generalized weakness, lightheadedness, and fatigue. The patient tolerated her last treatment fair with some mild nausea without vomiting. She takes zofran for nausea. She occasionally has diarrhea but states that it is random and not necessarily following treatment. She has a history of IBS with predominant diarrhea.  She denies any fever, chills, or night sweats.  The patient had lost weight the over the last couple appointments and had recently met with our nutritionist.  She was instructed to drink Ensure 3 times a day per their recommendations. The patient is very appreciative of the nutritional advice and has actually gained ~1.5 lbs since the last appointment instead of continuing to lose weight.  He denies any chest pain, shortness of breath, cough, or hemoptysis.  She denies any vomiting or constipation.  She denies any bleeding or bruising including melena,  hematochezia, epistaxis, or hematuria.  She denies any headache or visual changes.  She is here today for evaluation before starting cycle #5 with single agent Alimta today.  MEDICAL HISTORY: Past Medical History:  Diagnosis Date  . Anemia    "long time ago" (12/06/2012)  . Anxiety   . Arthritis    "right knee real bad; in my hands bad" (12/06/2012)  . Asthma   . Celiac disease   . Colon polyps   . COPD (chronic obstructive pulmonary disease) ([place])   . Coronary artery disease    a. s/p Xience DES to [facility] 04/2008;  b. LHC 05/2008: Proximal RCA 25%, mid RCA stent patent.  c. Anormal nuc 2014 -> s/p LHC with severe mRCA stenosis s/p DES. d. Cath 04/2013 s/p DES to LAD.  e. LHC 08/2015 was stable.  . Depression    "years ago" (12/06/2012)  . Fibromyalgia   . Gallstones   . GERD (gastroesophageal reflux disease)   . H/O hiatal hernia   . Hepatitis    "not A, B, or C" (12/06/2012)  . Hyperlipidemia    intol to statins and other chol agents due to elevated LFTs  . Hypertension   . Hypothyroidism   . IBS (irritable bowel syndrome)   . Interstitial cystitis   . Lung cancer ([place])   . Lung nodules    Bilateral  . Migraines    "when I was younger" (12/06/2012)  . Pneumonia   . PONV (postoperative nausea and vomiting)    "Very bad"  . Premature atrial contractions   . PVC's (premature ventricular contractions)   . Sinus headache   .  SVT (supraventricular tachycardia) ([facility])     ALLERGIES:  is allergic to ciprofloxacin; levofloxacin; statins; tricor [fenofibrate]; latex; codeine; and metoprolol.  MEDICATIONS:  Current Outpatient Medications  Medication Sig Dispense Refill  . albuterol (PROAIR HFA) 108 (90 Base) MCG/ACT inhaler Inhale 1 puff into the lungs every 6 (six) hours as needed (wheezing/shortness of breath.).     Marland Kitchen albuterol (PROVENTIL) (5 MG/ML) 0.5% nebulizer solution Take 2.5 mg by nebulization every 6 (six) hours as needed for wheezing.    Marland Kitchen aspirin 81 MG chewable tablet Chew 1  tablet (81 mg total) by mouth daily.    . BELSOMRA 20 MG TABS Take 20 mg by mouth at bedtime as needed for sleep.    Marland Kitchen buPROPion (WELLBUTRIN SR) 150 MG 12 hr tablet Take 150-300 mg by mouth See admin instructions. Take 2 tablets (300 mg) by mouth daily in the morning & take 1 tablet (150 mg) by mouth in the evening.    . cefdinir (OMNICEF) 300 MG capsule Take 1 capsule by mouth every 12 (twelve) hours.    . clopidogrel (PLAVIX) 75 MG tablet Take 1 tablet (75 mg total) by mouth daily. MUST SCHEDULE APPOINTMENT FOR FURTHER REFILLS 30 tablet 0  . DULoxetine (CYMBALTA) 60 MG capsule Take 120 mg by mouth daily.    . ergocalciferol (VITAMIN D2) 50000 UNITS capsule Take 50,000 Units by mouth every Wednesday.     . folic acid (FOLVITE) 1 MG tablet Take 1 tablet (1 mg total) by mouth daily. 30 tablet 4  . HYDROcodone-acetaminophen (NORCO) 7.5-325 MG per tablet Take 1 tablet by mouth every 6 (six) hours as needed for pain.    Marland Kitchen levothyroxine (SYNTHROID, LEVOTHROID) 25 MCG tablet Take 25 mcg by mouth daily before breakfast.    . lidocaine-prilocaine (EMLA) cream Apply 1 application topically as needed. 30 g 0  . loperamide (IMODIUM) 2 MG capsule Take 1 capsule (2 mg total) by mouth as needed for diarrhea or loose stools (Initiate if stool C diff -ve). 30 capsule 0  . LORazepam (ATIVAN) 2 MG tablet Take 2 mg by mouth See admin instructions. Take 1 tablet (2 mg) by mouth scheduled at bedtime & take 1 tablet (2 mg) by mouth twice daily if needed for anxiety    . metoprolol tartrate (LOPRESSOR) 25 MG tablet Take 1 tablet (25 mg total) by mouth 2 (two) times daily. 180 tablet 3  . montelukast (SINGULAIR) 10 MG tablet Take 10 mg by mouth at bedtime.     . nitroGLYCERIN (NITROSTAT) 0.4 MG SL tablet Place 1 tablet (0.4 mg total) under the tongue every 5 (five) minutes as needed for chest pain. 25 tablet 12  . omeprazole (PRILOSEC) 40 MG capsule Take 1 tablet by mouth twice a day for 2 weeks then 1 tablet every morning  thereafter 45 capsule 8  . ondansetron (ZOFRAN ODT) 4 MG disintegrating tablet Take 1 tablet (4 mg total) by mouth every 8 (eight) hours as needed for nausea or vomiting. 20 tablet 0  . ondansetron (ZOFRAN) 8 MG tablet Take 1 tablet (8 mg total) by mouth every 8 (eight) hours as needed for nausea or vomiting. 30 tablet 0  . predniSONE (DELTASONE) 20 MG tablet Take 1 tablet by mouth See admin instructions. Take TID for 3 days then taper, take BID for 3 days, then take once daily for 3 days    . prochlorperazine (COMPAZINE) 10 MG tablet TAKE 1 TABLET(10 MG) BY MOUTH EVERY 6 HOURS AS NEEDED FOR NAUSEA OR  VOMITING 30 tablet 0  . SYMBICORT 160-4.5 MCG/ACT inhaler Take 2 puffs by mouth 2 (two) times daily.  3  . temazepam (RESTORIL) 15 MG capsule Take 15 mg by mouth at bedtime.     . vitamin B-12 100 MCG tablet Take 1 tablet (100 mcg total) by mouth daily. 30 tablet 1  . amLODipine (NORVASC) 5 MG tablet Take 1.5 tablets (7.5 mg total) by mouth daily. (Patient taking differently: Take 7.5 mg by mouth every evening. ) 180 tablet 3   No current facility-administered medications for this visit.    Facility-Administered Medications Ordered in Other Visits  Medication Dose Route Frequency Provider Last Rate Last Dose  . acetaminophen (TYLENOL) tablet 650 mg  650 mg Oral Once Heilingoetter, Cassandra L, PA-C      . diphenhydrAMINE (BENADRYL) capsule 25 mg  25 mg Oral Once Heilingoetter, Cassandra L, PA-C        SURGICAL HISTORY:  Past Surgical History:  Procedure Laterality Date  . CARDIAC CATHETERIZATION  04/2008; 05/2008  . CARDIAC CATHETERIZATION N/A 08/07/2015   Procedure: Left Heart Cath and Coronary Angiography;  Surgeon: Sherren Mocha, MD;  Location: [facility] CV LAB;  Service: Cardiovascular;  Laterality: N/A;  . CHOLECYSTECTOMY    . CORONARY ANGIOPLASTY WITH STENT PLACEMENT  04/2008 12/06/2012   "1 + 1" (12/06/2012)  . CORONARY STENT PLACEMENT  05/08/2013   DES TO LAD      DR MCALHANY  . IR  IMAGING GUIDED PORT INSERTION  04/29/2018  . LEFT HEART CATHETERIZATION WITH CORONARY ANGIOGRAM N/A 12/06/2012   Procedure: LEFT HEART CATHETERIZATION WITH CORONARY ANGIOGRAM;  Surgeon: [name], MD;  Location: [facility] CATH LAB;  Service: Cardiovascular;  Laterality: N/A;  . LEFT HEART CATHETERIZATION WITH CORONARY ANGIOGRAM N/A 05/08/2013   Procedure: LEFT HEART CATHETERIZATION WITH CORONARY ANGIOGRAM;  Surgeon: [name], MD;  Location: [facility] CATH LAB;  Service: Cardiovascular;  Laterality: N/A;  . LEFT HEART CATHETERIZATION WITH CORONARY ANGIOGRAM N/A 01/04/2014   Procedure: LEFT HEART CATHETERIZATION WITH CORONARY ANGIOGRAM;  Surgeon: [name], MD;  Location: [facility] CATH LAB;  Service: Cardiovascular;  Laterality: N/A;  . PERCUTANEOUS CORONARY STENT INTERVENTION (PCI-S)  12/06/2012   Procedure: PERCUTANEOUS CORONARY STENT INTERVENTION (PCI-S);  Surgeon: [name], MD;  Location: [facility] CATH LAB;  Service: Cardiovascular;;  . PERCUTANEOUS CORONARY STENT INTERVENTION (PCI-S)  05/08/2013   Procedure: PERCUTANEOUS CORONARY STENT INTERVENTION (PCI-S);  Surgeon: [name], MD;  Location: [facility] CATH LAB;  Service: Cardiovascular;;  mid LAD   . TUBAL LIGATION    . VAGINAL HYSTERECTOMY    . VIDEO BRONCHOSCOPY WITH ENDOBRONCHIAL NAVIGATION N/A 02/17/2018   Procedure: VIDEO BRONCHOSCOPY WITH ENDOBRONCHIAL NAVIGATION;  Surgeon: Melrose Nakayama, MD;  Location: [facility];  Service: Thoracic;  Laterality: N/A;    REVIEW OF SYSTEMS:   Review of Systems  Constitutional: Positive for generalized weakness, fatigue, and lightheadedness upon standing. Negative for appetite change, chills, fever and unexpected weight change.  HENT:   Negative for mouth sores, nosebleeds, sore throat and trouble swallowing.   Eyes: Negative for eye problems and icterus.  Respiratory: Negative for cough, hemoptysis, shortness of breath and wheezing.   Cardiovascular: Negative for chest  pain and leg swelling.  Gastrointestinal: Positive for occasional nausea and diarrhea. Negative for abdominal pain, constipation, and vomiting.  Genitourinary: Negative for bladder incontinence, difficulty urinating, dysuria, frequency and hematuria.   Musculoskeletal: Negative for back pain, gait problem, neck pain and neck stiffness.  Skin: Negative for itching and  rash.  Neurological: Negative for dizziness, extremity weakness, gait problem, headaches, and seizures.  Hematological: Negative for adenopathy. Does not bruise/bleed easily.  Psychiatric/Behavioral: Negative for confusion, depression and sleep disturbance. The patient is not nervous/anxious.     PHYSICAL EXAMINATION:  Blood pressure 123/69, pulse 99, temperature 98.2 F (36.8 C), temperature source Oral, resp. rate 18, height 5' 6"  (1.676 m), weight 153 lb 12.8 oz (69.8 kg), SpO2 92 %.  ECOG PERFORMANCE STATUS: 1 - Symptomatic but completely ambulatory  Physical Exam  Constitutional: Oriented to person, place, and time and well-developed, well-nourished, and in no distress.  HENT:  Head: Normocephalic and atraumatic.  Mouth/Throat: Oropharynx is clear and moist. No oropharyngeal exudate.  Eyes: Conjunctivae are normal. Right eye exhibits no discharge. Left eye exhibits no discharge. No scleral icterus.  Neck: Normal range of motion. Neck supple.  Cardiovascular: Normal rate, regular rhythm, normal heart sounds and intact distal pulses.   Pulmonary/Chest: Effort normal and breath sounds are quiet in all lung fields. No respiratory distress. No wheezes. No rales.  Abdominal: Soft. Bowel sounds are normal. Exhibits no distension and no mass. There is no tenderness.  Musculoskeletal: Normal range of motion. Exhibits no edema.  Lymphadenopathy:    No cervical adenopathy.  Neurological: Alert and oriented to person, place, and time. Exhibits normal muscle tone. Gait normal. Coordination normal.  Skin: Skin is warm and dry. No  rash noted. Not diaphoretic. No erythema. No pallor.  Psychiatric: Mood, memory and judgment normal.  Vitals reviewed.  LABORATORY DATA: Lab Results  Component Value Date   WBC 7.1 07/12/2018   HGB 7.2 (L) 07/12/2018   HCT 22.9 (L) 07/12/2018   MCV 106.0 (H) 07/12/2018   PLT 245 07/12/2018      Chemistry      Component Value Date/Time   NA 143 07/12/2018 1011   NA 143 01/10/2018 1629   K 4.1 07/12/2018 1011   CL 107 07/12/2018 1011   CO2 26 07/12/2018 1011   BUN 17 07/12/2018 1011   BUN 14 01/10/2018 1629   CREATININE 0.89 07/12/2018 1011   CREATININE 0.68 08/05/2015 1456      Component Value Date/Time   CALCIUM 8.9 07/12/2018 1011   ALKPHOS 99 07/12/2018 1011   AST 17 07/12/2018 1011   ALT 35 07/12/2018 1011   BILITOT 0.3 07/12/2018 1011       RADIOGRAPHIC STUDIES:  No results found.   ASSESSMENT/PLAN:  This is a very pleasant 63 year old Caucasian female who was recently diagnosed with stage IV non-small cell lung cancer, adenocarcinoma with no actionable mutations and a PDL 1 expression of 1%.  She was diagnosed in December 2019 with bilateral pulmonary nodules. She is currently undergoing treatment with carboplatin for an AUC of 5, Alimta 500 mg/m, and Keytruda 200 mg IV every 3 weeks.  After cycle #2, Keytruda was dropped due to intolerability. The patient is status post 4 cycles.  The patient has been tolerating treatment much better since Keytruda was removed from her treatment plan except for fatigue and generalized weakness. Starting from today, cycle #5, the patient is undergoing treatment with maintenance single agent Alimta 500 mg/m2 IV every 3 weeks.   The patient was seen with Dr. Julien Nordmann today.  Labs were reviewed.  The patient's hemoglobin is 7.2 today. The patient is symptomatic with fatigue, generalized weakness, and lightheadedness. The plan is to arrange for the patient to receive 1 unit of blood today in addition to receiving cycle #5 of her  treatment with  single agent Alimta.  I will see the patient back for a follow up visit in 3 weeks for evaluation prior to starting cycle #6.   The patient was advised to call immediately if she has any concerning symptoms in the interval. The patient voices understanding of current disease status and treatment options and is in agreement with the current care plan. All questions were answered. The patient knows to call the clinic with any problems, questions or concerns. We can certainly see the patient much sooner if necessary  Orders Placed This Encounter  Procedures  . CBC with Differential (Cancer Center Only)    Standing Status:   Standing    Number of Occurrences:   10    Standing Expiration Date:   07/12/2019  . CMP ([facility] only)    Standing Status:   Standing    Number of Occurrences:   10    Standing Expiration Date:   07/12/2019  . Practitioner attestation of consent    I, the ordering practitioner, attest that I have discussed with the patient the benefits, risks, side effects, alternatives, likelihood of achieving goals and potential problems during recovery for the procedure listed.    Standing Status:   Future    Standing Expiration Date:   07/12/2019    Order Specific Question:   Procedure    Answer:   Blood Product(s)  . Complete patient signature process for consent form    Standing Status:   Future    Standing Expiration Date:   07/12/2019  . Care order/instruction    Transfuse Parameters    Standing Status:   Future    Standing Expiration Date:   07/12/2019  . Type and screen    Standing Status:   Future    Number of Occurrences:   1    Standing Expiration Date:   07/12/2019     Cassandra L Heilingoetter, PA-C 07/12/18  ADDENDUM: Hematology/Oncology Attending: I had a face-to-face encounter with the patient today.  I recommended her care plan.  This is a very pleasant 63 years old white female with a stage IV non-small cell lung cancer, adenocarcinoma  with PDL 1 expression of 1% status post 4 cycles of systemic chemotherapy initially with carboplatin, Alimta and Keytruda for the first 2 cycles followed by carboplatin and Alimta for 2 more cycles.  She has been tolerating her treatment much better after discontinuation of Keytruda because of the significant diarrhea as well as nausea and vomiting.  The patient is feeling much better today. I recommended for her to proceed with cycle #5 today as planned with single agent Alimta. I will see her back for follow-up visit in 3 weeks for evaluation before the next cycle of her treatment. She was advised to call immediately if she has any concerning symptoms in the interval.  Disclaimer: This note was dictated with voice recognition software. Similar sounding words can inadvertently be transcribed and may be missed upon review. Eilleen Kempf, MD 07/12/18

## 2018-07-12 NOTE — Patient Instructions (Addendum)
  [facility] Discharge Instructions for Patients Receiving Chemotherapy  Today you received the following chemotherapy agents Alimta  To help prevent nausea and vomiting after your treatment, we encourage you to take your nausea medication as directed.  If you develop nausea and vomiting that is not controlled by your nausea medication, call the clinic.   BELOW ARE SYMPTOMS THAT SHOULD BE REPORTED IMMEDIATELY:  *FEVER GREATER THAN 100.5 F  *CHILLS WITH OR WITHOUT FEVER  NAUSEA AND VOMITING THAT IS NOT CONTROLLED WITH YOUR NAUSEA MEDICATION  *UNUSUAL SHORTNESS OF BREATH  *UNUSUAL BRUISING OR BLEEDING  TENDERNESS IN MOUTH AND THROAT WITH OR WITHOUT PRESENCE OF ULCERS  *URINARY PROBLEMS  *BOWEL PROBLEMS  UNUSUAL RASH Items with * indicate a potential emergency and should be followed up as soon as possible.  Feel free to call the clinic should you have any questions or concerns. The clinic phone number is [phone].  Please show the [facility] at check-in to the Emergency Department and triage nurse.   Blood Transfusion, Adult, Care After This sheet gives you information about how to care for yourself after your procedure. Your health care provider may also give you more specific instructions. If you have problems or questions, contact your health care provider. What can I expect after the procedure? After your procedure, it is common to have: Bruising and soreness where the IV tube was inserted. Headache. Follow these instructions at home:  Take over-the-counter and prescription medicines only as told by your health care provider. Return to your normal activities as told by your health care provider. Follow instructions from your health care provider about how to take care of your IV insertion site. Make sure you: Wash your hands with soap and water before you change your bandage (dressing). If soap and water are not available, use hand  sanitizer. Change your dressing as told by your health care provider. Check your IV insertion site every day for signs of infection. Check for: More redness, swelling, or pain. More fluid or blood. Warmth. Pus or a bad smell. Contact a health care provider if: You have more redness, swelling, or pain around the IV insertion site. You have more fluid or blood coming from the IV insertion site. Your IV insertion site feels warm to the touch. You have pus or a bad smell coming from the IV insertion site. Your urine turns pink, red, or brown. You feel weak after doing your normal activities. Get help right away if: You have signs of a serious allergic or immune system reaction, including: Itchiness. Hives. Trouble breathing. Anxiety. Chest or lower back pain. Fever, flushing, and chills. Rapid pulse. Rash. Diarrhea. Vomiting. Dark urine. Serious headache. Dizziness. Stiff neck. Yellow coloration of the face or the white parts of the eyes (jaundice). This information is not intended to replace advice given to you by your health care provider. Make sure you discuss any questions you have with your health care provider. Document Released: 03/09/2014 Document Revised: 10/16/2015 Document Reviewed: 09/02/2015 Elsevier Interactive Patient Education  2019 [employer].

## 2018-07-13 LAB — TYPE AND SCREEN
ABO/RH(D): A NEG
Antibody Screen: POSITIVE
Unit division: 0

## 2018-07-13 LAB — BPAM RBC
Blood Product Expiration Date: 202006032359
ISSUE DATE / TIME: 202005121347
Unit Type and Rh: 600

## 2018-07-14 ENCOUNTER — Telehealth: Payer: Self-pay | Admitting: Physician Assistant

## 2018-07-14 NOTE — Telephone Encounter (Signed)
Scheduled appt per 5/12 los - vmail full - unable to leave message .

## 2018-07-18 ENCOUNTER — Other Ambulatory Visit: Payer: Managed Care, Other (non HMO)

## 2018-07-19 ENCOUNTER — Inpatient Hospital Stay: Payer: Managed Care, Other (non HMO)

## 2018-07-26 ENCOUNTER — Other Ambulatory Visit: Payer: Managed Care, Other (non HMO)

## 2018-08-02 ENCOUNTER — Inpatient Hospital Stay: Payer: Managed Care, Other (non HMO)

## 2018-08-02 ENCOUNTER — Telehealth: Payer: Self-pay | Admitting: Nutrition

## 2018-08-02 ENCOUNTER — Encounter: Payer: Self-pay | Admitting: Physician Assistant

## 2018-08-02 ENCOUNTER — Other Ambulatory Visit: Payer: Self-pay

## 2018-08-02 ENCOUNTER — Inpatient Hospital Stay: Payer: Managed Care, Other (non HMO) | Admitting: Nutrition

## 2018-08-02 ENCOUNTER — Inpatient Hospital Stay: Payer: Managed Care, Other (non HMO) | Attending: Internal Medicine

## 2018-08-02 ENCOUNTER — Inpatient Hospital Stay (HOSPITAL_BASED_OUTPATIENT_CLINIC_OR_DEPARTMENT_OTHER): Payer: Managed Care, Other (non HMO) | Admitting: Physician Assistant

## 2018-08-02 VITALS — BP 128/93 | HR 92 | Temp 98.7°F | Resp 18 | Ht 66.0 in | Wt 147.5 lb

## 2018-08-02 DIAGNOSIS — Z7902 Long term (current) use of antithrombotics/antiplatelets: Secondary | ICD-10-CM

## 2018-08-02 DIAGNOSIS — I10 Essential (primary) hypertension: Secondary | ICD-10-CM | POA: Diagnosis not present

## 2018-08-02 DIAGNOSIS — Z79899 Other long term (current) drug therapy: Secondary | ICD-10-CM | POA: Diagnosis not present

## 2018-08-02 DIAGNOSIS — E039 Hypothyroidism, unspecified: Secondary | ICD-10-CM

## 2018-08-02 DIAGNOSIS — Z95828 Presence of other vascular implants and grafts: Secondary | ICD-10-CM

## 2018-08-02 DIAGNOSIS — I2510 Atherosclerotic heart disease of native coronary artery without angina pectoris: Secondary | ICD-10-CM | POA: Diagnosis not present

## 2018-08-02 DIAGNOSIS — Z7951 Long term (current) use of inhaled steroids: Secondary | ICD-10-CM | POA: Diagnosis not present

## 2018-08-02 DIAGNOSIS — Z7952 Long term (current) use of systemic steroids: Secondary | ICD-10-CM | POA: Insufficient documentation

## 2018-08-02 DIAGNOSIS — R0981 Nasal congestion: Secondary | ICD-10-CM | POA: Insufficient documentation

## 2018-08-02 DIAGNOSIS — R531 Weakness: Secondary | ICD-10-CM | POA: Insufficient documentation

## 2018-08-02 DIAGNOSIS — J449 Chronic obstructive pulmonary disease, unspecified: Secondary | ICD-10-CM

## 2018-08-02 DIAGNOSIS — C3492 Malignant neoplasm of unspecified part of left bronchus or lung: Secondary | ICD-10-CM | POA: Insufficient documentation

## 2018-08-02 DIAGNOSIS — D6481 Anemia due to antineoplastic chemotherapy: Secondary | ICD-10-CM

## 2018-08-02 DIAGNOSIS — E785 Hyperlipidemia, unspecified: Secondary | ICD-10-CM | POA: Insufficient documentation

## 2018-08-02 DIAGNOSIS — R634 Abnormal weight loss: Secondary | ICD-10-CM | POA: Insufficient documentation

## 2018-08-02 DIAGNOSIS — Z7982 Long term (current) use of aspirin: Secondary | ICD-10-CM | POA: Insufficient documentation

## 2018-08-02 DIAGNOSIS — Z5111 Encounter for antineoplastic chemotherapy: Secondary | ICD-10-CM

## 2018-08-02 DIAGNOSIS — R5383 Other fatigue: Secondary | ICD-10-CM | POA: Insufficient documentation

## 2018-08-02 DIAGNOSIS — C3491 Malignant neoplasm of unspecified part of right bronchus or lung: Secondary | ICD-10-CM | POA: Insufficient documentation

## 2018-08-02 DIAGNOSIS — T451X5A Adverse effect of antineoplastic and immunosuppressive drugs, initial encounter: Secondary | ICD-10-CM

## 2018-08-02 LAB — CMP (CANCER CENTER ONLY)
ALT: 51 U/L — ABNORMAL HIGH (ref 0–44)
AST: 56 U/L — ABNORMAL HIGH (ref 15–41)
Albumin: 4 g/dL (ref 3.5–5.0)
Alkaline Phosphatase: 104 U/L (ref 38–126)
Anion gap: 8 (ref 5–15)
BUN: 16 mg/dL (ref 8–23)
CO2: 28 mmol/L (ref 22–32)
Calcium: 9.2 mg/dL (ref 8.9–10.3)
Chloride: 105 mmol/L (ref 98–111)
Creatinine: 0.99 mg/dL (ref 0.44–1.00)
GFR, Est AFR Am: >60 mL/min (ref >60)
GFR, Estimated: >60 mL/min (ref >60)
Glucose, Bld: 124 mg/dL — ABNORMAL HIGH (ref 70–99)
Potassium: 4 mmol/L (ref 3.5–5.1)
Sodium: 141 mmol/L (ref 135–145)
Total Bilirubin: 0.5 mg/dL (ref 0.3–1.2)
Total Protein: 7.1 g/dL (ref 6.5–8.1)

## 2018-08-02 LAB — CBC WITH DIFFERENTIAL (CANCER CENTER ONLY)
Abs Immature Granulocytes: 0.02 10*3/uL (ref 0.00–0.07)
Basophils Absolute: 0.1 10*3/uL (ref 0.0–0.1)
Basophils Relative: 1 %
Eosinophils Absolute: 0.7 10*3/uL — ABNORMAL HIGH (ref 0.0–0.5)
Eosinophils Relative: 8 %
HCT: 33.4 % — ABNORMAL LOW (ref 36.0–46.0)
Hemoglobin: 11 g/dL — ABNORMAL LOW (ref 12.0–15.0)
Immature Granulocytes: 0 %
Lymphocytes Relative: 16 %
Lymphs Abs: 1.4 10*3/uL (ref 0.7–4.0)
MCH: 33 pg (ref 26.0–34.0)
MCHC: 32.9 g/dL (ref 30.0–36.0)
MCV: 100.3 fL — ABNORMAL HIGH (ref 80.0–100.0)
Monocytes Absolute: 0.9 10*3/uL (ref 0.1–1.0)
Monocytes Relative: 10 %
Neutro Abs: 5.6 10*3/uL (ref 1.7–7.7)
Neutrophils Relative %: 65 %
Platelet Count: 241 10*3/uL (ref 150–400)
RBC: 3.33 MIL/uL — ABNORMAL LOW (ref 3.87–5.11)
RDW: 16.9 % — ABNORMAL HIGH (ref 11.5–15.5)
WBC Count: 8.5 10*3/uL (ref 4.0–10.5)
nRBC: 0 % (ref 0.0–0.2)

## 2018-08-02 MED ORDER — PROCHLORPERAZINE MALEATE 10 MG PO TABS
10.0000 mg | ORAL_TABLET | Freq: Once | ORAL | Status: AC
  Administered 2018-08-02: 10 mg via ORAL

## 2018-08-02 MED ORDER — SODIUM CHLORIDE 0.9 % IV SOLN
Freq: Once | INTRAVENOUS | Status: AC
  Administered 2018-08-02: 13:00:00 via INTRAVENOUS
  Filled 2018-08-02: qty 250

## 2018-08-02 MED ORDER — SODIUM CHLORIDE 0.9% FLUSH
10.0000 mL | INTRAVENOUS | Status: DC | PRN
  Administered 2018-08-02: 10 mL
  Filled 2018-08-02: qty 10

## 2018-08-02 MED ORDER — SODIUM CHLORIDE 0.9% FLUSH
10.0000 mL | Freq: Once | INTRAVENOUS | Status: AC
  Administered 2018-08-02: 10 mL
  Filled 2018-08-02: qty 10

## 2018-08-02 MED ORDER — SODIUM CHLORIDE 0.9 % IV SOLN
900.0000 mg | Freq: Once | INTRAVENOUS | Status: AC
  Administered 2018-08-02: 900 mg via INTRAVENOUS
  Filled 2018-08-02: qty 20

## 2018-08-02 MED ORDER — PROCHLORPERAZINE MALEATE 10 MG PO TABS
ORAL_TABLET | ORAL | Status: AC
  Filled 2018-08-02: qty 1

## 2018-08-02 MED ORDER — HEPARIN SOD (PORK) LOCK FLUSH 100 UNIT/ML IV SOLN
500.0000 [IU] | Freq: Once | INTRAVENOUS | Status: AC | PRN
  Administered 2018-08-02: 500 [IU]
  Filled 2018-08-02: qty 5

## 2018-08-02 NOTE — Telephone Encounter (Signed)
I have contacted patient twice on cell phone.  She did not answer.  I was unable to leave a message because her voice mailbox is full.  I then contacted patient's husband and left message for patient to return my call.  Provided name and contact information.

## 2018-08-02 NOTE — Patient Instructions (Signed)
  [facility] Discharge Instructions for Patients Receiving Chemotherapy  Today you received the following chemotherapy agents Alimta  To help prevent nausea and vomiting after your treatment, we encourage you to take your nausea medication as directed.  If you develop nausea and vomiting that is not controlled by your nausea medication, call the clinic.   BELOW ARE SYMPTOMS THAT SHOULD BE REPORTED IMMEDIATELY:  *FEVER GREATER THAN 100.5 F  *CHILLS WITH OR WITHOUT FEVER  NAUSEA AND VOMITING THAT IS NOT CONTROLLED WITH YOUR NAUSEA MEDICATION  *UNUSUAL SHORTNESS OF BREATH  *UNUSUAL BRUISING OR BLEEDING  TENDERNESS IN MOUTH AND THROAT WITH OR WITHOUT PRESENCE OF ULCERS  *URINARY PROBLEMS  *BOWEL PROBLEMS  UNUSUAL RASH Items with * indicate a potential emergency and should be followed up as soon as possible.  Feel free to call the clinic should you have any questions or concerns. The clinic phone number is [phone].  Please show the [facility] at check-in to the Emergency Department and triage nurse.   Blood Transfusion, Adult, Care After This sheet gives you information about how to care for yourself after your procedure. Your health care provider may also give you more specific instructions. If you have problems or questions, contact your health care provider. What can I expect after the procedure? After your procedure, it is common to have: Bruising and soreness where the IV tube was inserted. Headache. Follow these instructions at home:  Take over-the-counter and prescription medicines only as told by your health care provider. Return to your normal activities as told by your health care provider. Follow instructions from your health care provider about how to take care of your IV insertion site. Make sure you: Wash your hands with soap and water before you change your bandage (dressing). If soap and water are not available, use hand sanitizer.  Change your dressing as told by your health care provider. Check your IV insertion site every day for signs of infection. Check for: More redness, swelling, or pain. More fluid or blood. Warmth. Pus or a bad smell. Contact a health care provider if: You have more redness, swelling, or pain around the IV insertion site. You have more fluid or blood coming from the IV insertion site. Your IV insertion site feels warm to the touch. You have pus or a bad smell coming from the IV insertion site. Your urine turns pink, red, or brown. You feel weak after doing your normal activities. Get help right away if: You have signs of a serious allergic or immune system reaction, including: Itchiness. Hives. Trouble breathing. Anxiety. Chest or lower back pain. Fever, flushing, and chills. Rapid pulse. Rash. Diarrhea. Vomiting. Dark urine. Serious headache. Dizziness. Stiff neck. Yellow coloration of the face or the white parts of the eyes (jaundice). This information is not intended to replace advice given to you by your health care provider. Make sure you discuss any questions you have with your health care provider. Document Released: 03/09/2014 Document Revised: 10/16/2015 Document Reviewed: 09/02/2015 Elsevier Interactive Patient Education  2019 [employer].

## 2018-08-02 NOTE — Progress Notes (Signed)
[facility] OFFICE PROGRESS NOTE  [name], NP [place] [address]  DIAGNOSIS: Stage IV (T1a, N0, M1 a) non-small cell lung cancer, adenocarcinoma presented with multifocal disease in both lungs including 3 nodules in the left lung as well as 2 nodules in the right lung diagnosed in December 2019. No actionable mutations KRAS G12C BCOR N1425S-subclonal RAD21 amplification  PDL 1 expression 1%.  PRIOR THERAPY: None  CURRENT THERAPY: Systemic chemotherapy with carboplatin for AUC of 5, Alimta 500 mg/M2 and Keytruda 200 mg IV every 3 weeks. First dose April 12, 2018.Status post5cycles.Starting from cycle #3, Beryle Flock was dropped and patient received treatment with Carboplatin for an AUC of 5 and Alimta 500 mg/m2 IV every 3 weeks.Starting from cycle #5, the patient will be on maintenance Alimta 500 mg/m2 IV every 3 weeks.  INTERVAL HISTORY: MAUI AHART 63 y.o. female returns to the clinic for a follow-up visit.  At the patient's last appointment, the patient was complaining of fatigue, weakness, and lightheadedness.  Her hemoglobin was found to be 7.2.  She received 1 unit of packed RBCs.  After receiving the blood, the patient states that she immediately felt better. However, the patient started mild experiencing generalized weakness and fatigue again starting last week on Thursday. She is also experiencing nasal congestion and eye burning secondary to her seasonal allergies which is unchanged.  She is currently taking 2 allergy medications and eyedrops. She Denies any other associated symptoms such as fevers, chills, night sweats, sore throat, facial pain/pressure, nausea, vomiting, or significant changes with her breathing.    The patient has been tolerating her last treatment better since being on maintenance single agent Alimta. She denies any chest pain or hemoptysis. She reports one episode of nausea and vomiting that resolved with  Zofran. She The patient has baseline dyspnea on exertion and occasionally has to sit down to catch her breath due to her underlying COPD.  She has an inhaler for her COPD. She denies any headaches or visual changes. She has lost about 6 lbs since her last appointment despite eating better and drinking 3 ensures a day. She has an appointment with our nutrition staff later this evening. She is here today for evaluation prior to starting cycle #6.    MEDICAL HISTORY: Past Medical History:  Diagnosis Date  . Anemia    "long time ago" (12/06/2012)  . Anxiety   . Arthritis    "right knee real bad; in my hands bad" (12/06/2012)  . Asthma   . Celiac disease   . Colon polyps   . COPD (chronic obstructive pulmonary disease) ([facility])   . Coronary artery disease    a. s/p Xience DES to [facility] 04/2008;  b. LHC 05/2008: Proximal RCA 25%, mid RCA stent patent.  c. Anormal nuc 2014 -> s/p LHC with severe mRCA stenosis s/p DES. d. Cath 04/2013 s/p DES to LAD.  e. LHC 08/2015 was stable.  . Depression    "years ago" (12/06/2012)  . Fibromyalgia   . Gallstones   . GERD (gastroesophageal reflux disease)   . H/O hiatal hernia   . Hepatitis    "not A, B, or C" (12/06/2012)  . Hyperlipidemia    intol to statins and other chol agents due to elevated LFTs  . Hypertension   . Hypothyroidism   . IBS (irritable bowel syndrome)   . Interstitial cystitis   . Lung cancer ([place])   . Lung nodules    Bilateral  .  Migraines    "when I was younger" (12/06/2012)  . Pneumonia   . PONV (postoperative nausea and vomiting)    "Very bad"  . Premature atrial contractions   . PVC's (premature ventricular contractions)   . Sinus headache   . SVT (supraventricular tachycardia) ([facility])     ALLERGIES:  is allergic to ciprofloxacin; levofloxacin; statins; tricor [fenofibrate]; latex; codeine; and metoprolol.  MEDICATIONS:  Current Outpatient Medications  Medication Sig Dispense Refill  . albuterol (PROAIR HFA) 108 (90 Base) MCG/ACT  inhaler Inhale 1 puff into the lungs every 6 (six) hours as needed (wheezing/shortness of breath.).     Marland Kitchen albuterol (PROVENTIL) (5 MG/ML) 0.5% nebulizer solution Take 2.5 mg by nebulization every 6 (six) hours as needed for wheezing.    Marland Kitchen aspirin 81 MG chewable tablet Chew 1 tablet (81 mg total) by mouth daily.    . BELSOMRA 20 MG TABS Take 20 mg by mouth at bedtime as needed for sleep.    Marland Kitchen buPROPion (WELLBUTRIN SR) 150 MG 12 hr tablet Take 150-300 mg by mouth See admin instructions. Take 2 tablets (300 mg) by mouth daily in the morning & take 1 tablet (150 mg) by mouth in the evening.    . clopidogrel (PLAVIX) 75 MG tablet Take 1 tablet (75 mg total) by mouth daily. MUST SCHEDULE APPOINTMENT FOR FURTHER REFILLS 30 tablet 0  . DULoxetine (CYMBALTA) 60 MG capsule Take 120 mg by mouth daily.    . ergocalciferol (VITAMIN D2) 50000 UNITS capsule Take 50,000 Units by mouth every Wednesday.     . folic acid (FOLVITE) 1 MG tablet Take 1 tablet (1 mg total) by mouth daily. 30 tablet 4  . HYDROcodone-acetaminophen (NORCO) 7.5-325 MG per tablet Take 1 tablet by mouth every 6 (six) hours as needed for pain.    Marland Kitchen levothyroxine (SYNTHROID, LEVOTHROID) 25 MCG tablet Take 25 mcg by mouth daily before breakfast.    . lidocaine-prilocaine (EMLA) cream Apply 1 application topically as needed. 30 g 0  . loperamide (IMODIUM) 2 MG capsule Take 1 capsule (2 mg total) by mouth as needed for diarrhea or loose stools (Initiate if stool C diff -ve). 30 capsule 0  . LORazepam (ATIVAN) 2 MG tablet Take 2 mg by mouth See admin instructions. Take 1 tablet (2 mg) by mouth scheduled at bedtime & take 1 tablet (2 mg) by mouth twice daily if needed for anxiety    . metoprolol tartrate (LOPRESSOR) 25 MG tablet Take 1 tablet (25 mg total) by mouth 2 (two) times daily. 180 tablet 3  . montelukast (SINGULAIR) 10 MG tablet Take 10 mg by mouth at bedtime.     . nitroGLYCERIN (NITROSTAT) 0.4 MG SL tablet Place 1 tablet (0.4 mg total) under  the tongue every 5 (five) minutes as needed for chest pain. 25 tablet 12  . omeprazole (PRILOSEC) 40 MG capsule Take 1 tablet by mouth twice a day for 2 weeks then 1 tablet every morning thereafter 45 capsule 8  . ondansetron (ZOFRAN ODT) 4 MG disintegrating tablet Take 1 tablet (4 mg total) by mouth every 8 (eight) hours as needed for nausea or vomiting. 20 tablet 0  . ondansetron (ZOFRAN) 8 MG tablet Take 1 tablet (8 mg total) by mouth every 8 (eight) hours as needed for nausea or vomiting. 30 tablet 0  . prochlorperazine (COMPAZINE) 10 MG tablet TAKE 1 TABLET(10 MG) BY MOUTH EVERY 6 HOURS AS NEEDED FOR NAUSEA OR VOMITING 30 tablet 0  . SYMBICORT 160-4.5 MCG/ACT  inhaler Take 2 puffs by mouth 2 (two) times daily.  3  . temazepam (RESTORIL) 15 MG capsule Take 15 mg by mouth at bedtime.     . vitamin B-12 100 MCG tablet Take 1 tablet (100 mcg total) by mouth daily. 30 tablet 1  . amLODipine (NORVASC) 5 MG tablet Take 1.5 tablets (7.5 mg total) by mouth daily. (Patient taking differently: Take 7.5 mg by mouth every evening. ) 180 tablet 3  . cefdinir (OMNICEF) 300 MG capsule Take 1 capsule by mouth every 12 (twelve) hours.    . predniSONE (DELTASONE) 20 MG tablet Take 1 tablet by mouth See admin instructions. Take TID for 3 days then taper, take BID for 3 days, then take once daily for 3 days     No current facility-administered medications for this visit.    Facility-Administered Medications Ordered in Other Visits  Medication Dose Route Frequency Provider Last Rate Last Dose  . heparin lock flush 100 unit/mL  500 Units Intracatheter Once PRN Curt Bears, MD      . PEMEtrexed (ALIMTA) 900 mg in sodium chloride 0.9 % 100 mL chemo infusion  900 mg Intravenous Once Curt Bears, MD      . prochlorperazine (COMPAZINE) tablet 10 mg  10 mg Oral Once Curt Bears, MD      . sodium chloride flush (NS) 0.9 % injection 10 mL  10 mL Intracatheter PRN Curt Bears, MD        SURGICAL  HISTORY:  Past Surgical History:  Procedure Laterality Date  . CARDIAC CATHETERIZATION  04/2008; 05/2008  . CARDIAC CATHETERIZATION N/A 08/07/2015   Procedure: Left Heart Cath and Coronary Angiography;  Surgeon: Sherren Mocha, MD;  Location: [facility] CV LAB;  Service: Cardiovascular;  Laterality: N/A;  . CHOLECYSTECTOMY    . CORONARY ANGIOPLASTY WITH STENT PLACEMENT  04/2008 12/06/2012   "1 + 1" (12/06/2012)  . CORONARY STENT PLACEMENT  05/08/2013   DES TO LAD      DR MCALHANY  . IR IMAGING GUIDED PORT INSERTION  04/29/2018  . LEFT HEART CATHETERIZATION WITH CORONARY ANGIOGRAM N/A 12/06/2012   Procedure: LEFT HEART CATHETERIZATION WITH CORONARY ANGIOGRAM;  Surgeon: [name], MD;  Location: [facility] CATH LAB;  Service: Cardiovascular;  Laterality: N/A;  . LEFT HEART CATHETERIZATION WITH CORONARY ANGIOGRAM N/A 05/08/2013   Procedure: LEFT HEART CATHETERIZATION WITH CORONARY ANGIOGRAM;  Surgeon: [name], MD;  Location: [facility] CATH LAB;  Service: Cardiovascular;  Laterality: N/A;  . LEFT HEART CATHETERIZATION WITH CORONARY ANGIOGRAM N/A 01/04/2014   Procedure: LEFT HEART CATHETERIZATION WITH CORONARY ANGIOGRAM;  Surgeon: [name], MD;  Location: [facility] CATH LAB;  Service: Cardiovascular;  Laterality: N/A;  . PERCUTANEOUS CORONARY STENT INTERVENTION (PCI-S)  12/06/2012   Procedure: PERCUTANEOUS CORONARY STENT INTERVENTION (PCI-S);  Surgeon: [name], MD;  Location: [facility] CATH LAB;  Service: Cardiovascular;;  . PERCUTANEOUS CORONARY STENT INTERVENTION (PCI-S)  05/08/2013   Procedure: PERCUTANEOUS CORONARY STENT INTERVENTION (PCI-S);  Surgeon: [name], MD;  Location: [facility] CATH LAB;  Service: Cardiovascular;;  mid LAD   . TUBAL LIGATION    . VAGINAL HYSTERECTOMY    . VIDEO BRONCHOSCOPY WITH ENDOBRONCHIAL NAVIGATION N/A 02/17/2018   Procedure: VIDEO BRONCHOSCOPY WITH ENDOBRONCHIAL NAVIGATION;  Surgeon: Melrose Nakayama, MD;  Location: [facility];  Service:  Thoracic;  Laterality: N/A;    REVIEW OF SYSTEMS:   Review of Systems  Constitutional: Positive for mild fatigue, weight loss, and generalized weakness. Negative for appetite change, chills, and fever.  HENT:   Negative for mouth sores, nosebleeds, sore throat and trouble swallowing.   Eyes: Positive for eye burning sensation bilaterally. Negative for redness, discharge, visual changes, and icterus.  Respiratory: Positive for baseline cough and dyspnea on exertion. Negative for hemoptysis and wheezing.   Cardiovascular: Negative for chest pain and leg swelling.  Gastrointestinal: Positive for one episode of nausea/vomiting (resolved). Negative for abdominal pain, constipation, and diarrhea. Genitourinary: Negative for bladder incontinence, difficulty urinating, dysuria, frequency and hematuria.   Musculoskeletal: Negative for back pain, gait problem, neck pain and neck stiffness.  Skin: Negative for itching and rash.  Neurological: Negative for dizziness, extremity weakness, gait problem, headaches, light-headedness and seizures.  Hematological: Negative for adenopathy. Does not bruise/bleed easily.  Psychiatric/Behavioral: Negative for confusion, depression and sleep disturbance. The patient is not nervous/anxious.     PHYSICAL EXAMINATION:  Blood pressure (!) 128/93, pulse 92, temperature 98.7 F (37.1 C), temperature source Oral, resp. rate 18, height _0  (1.676 m), weight 147 lb 8 oz (66.9 kg), SpO2 95 %.  ECOG PERFORMANCE STATUS: 1 - Symptomatic but completely ambulatory  Physical Exam  Constitutional: Oriented to person, place, and time and well-developed, well-nourished, and in no distress.   HENT:  Head: Normocephalic and atraumatic.  Mouth/Throat: Oropharynx is clear and moist. No oropharyngeal exudate.  Eyes: Conjunctivae are normal. Right eye exhibits no discharge. Left eye exhibits no discharge. No scleral icterus.  Neck: Normal range of motion. Neck supple.   Cardiovascular: Normal rate, regular rhythm, normal heart sounds and intact distal pulses.   Pulmonary/Chest: Decreased breath sounds across all lung fields. Effort normal. No respiratory distress. No wheezes. No rales.  Abdominal: Soft. Bowel sounds are normal. Exhibits no distension and no mass. There is no tenderness.  Musculoskeletal: Normal range of motion. Exhibits no edema.  Lymphadenopathy:    No cervical adenopathy.  Neurological: Alert and oriented to person, place, and time. Exhibits normal muscle tone. Gait normal. Coordination normal.  Skin: Skin is warm and dry. No rash noted. Not diaphoretic. No erythema. No pallor.  Psychiatric: Mood, memory and judgment normal.  Vitals reviewed.  LABORATORY DATA: Lab Results  Component Value Date   WBC 8.5 08/02/2018   HGB 11.0 (L) 08/02/2018   HCT 33.4 (L) 08/02/2018   MCV 100.3 (H) 08/02/2018   PLT 241 08/02/2018      Chemistry      Component Value Date/Time   NA 141 08/02/2018 1123   NA 143 01/10/2018 1629   K 4.0 08/02/2018 1123   CL 105 08/02/2018 1123   CO2 28 08/02/2018 1123   BUN 16 08/02/2018 1123   BUN 14 01/10/2018 1629   CREATININE 0.99 08/02/2018 1123   CREATININE 0.68 08/05/2015 1456      Component Value Date/Time   CALCIUM 9.2 08/02/2018 1123   ALKPHOS 104 08/02/2018 1123   AST 56 (H) 08/02/2018 1123   ALT 51 (H) 08/02/2018 1123   BILITOT 0.5 08/02/2018 1123       RADIOGRAPHIC STUDIES:  No results found.   ASSESSMENT/PLAN:  This is a very pleasant 63 year old Caucasian female who was diagnosed with stage IV non-small cell lung cancer, adenocarcinoma.  She has no actionable mutations and her PDL 1 expression is 1%.  She was diagnosed in December 2019.  She presented with bilateral pulmonary nodules.  She is currently undergoing treatment with carboplatin for an AUC 5, Alimta 500 mg/m, and Keytruda 200 mg IV every 3 weeks.  Beryle Flock was dropped after cycle #2 due to  intolerability.  She is status  post 5 cycles.  Starting from cycle #5, the patient will be undergoing treatment with maintenance single agent Alimta 500 mg/m.   The patient was seen with Dr. Julien Nordmann today.  Labs were reviewed.  Recommend that she proceed with cycle number as scheduled.  I will arrange for the patient to get a restaging CT scan for her next visit.  Due to the cost with her insurance, she will get her scan performed at [facility]   I will see the patient back for follow-up visit in 3 weeks for evaluation and to review her scan results.  Regarding the patient's weight loss, she has a meeting this evening with our nutritionist.  I recommend that she proceed with the meeting today as scheduled.  Regarding the patient's fatigue and generalized weakness, the patient's hemoglobin is improved to 11 today. The patient was encouraged to exercise. We will see if there are any new or concerning findings on her next chest CT. Otherwise, I encouraged the patient to seek medical evaluation should she develop any new or worsening symptoms in the interval.   The patient was advised to call immediately if she has any concerning symptoms in the interval. The patient voices understanding of current disease status and treatment options and is in agreement with the current care plan. All questions were answered. The patient knows to call the clinic with any problems, questions or concerns. We can certainly see the patient much sooner if necessary  Orders Placed This Encounter  Procedures  . CT Chest W Contrast    Standing Status:   Future    Standing Expiration Date:   08/02/2019    Order Specific Question:   ** REASON FOR EXAM (FREE TEXT)    Answer:   restaging lung cancer    Order Specific Question:   If indicated for the ordered procedure, I authorize the administration of contrast media per Radiology protocol    Answer:   Yes    Order Specific Question:   Preferred imaging location?    Answer:   [facility]     Order Specific Question:   Radiology Contrast Protocol - do NOT remove file path    Answer:   [email or web address]     Cassandra L Heilingoetter, PA-C 08/02/18  ADDENDUM: Hematology/Oncology Attending: I had a face-to-face encounter with the patient today.  I recommended her care plan.  This is a very pleasant 63 years old white female with metastatic non-small cell lung cancer, adenocarcinoma and she is currently undergoing treatment initially with systemic chemotherapy with carboplatin and Alimta and she is currently on maintenance treatment with single agent Alimta.  She has been tolerating this treatment well with no concerning adverse effects. I recommended for the patient to proceed with cycle #6 today as planned. She will come back for follow-up visit in 3 weeks for evaluation after repeating CT scan of the chest for restaging of her disease. The patient was advised to call immediately if she has any concerning symptoms in the interval.  Disclaimer: This note was dictated with voice recognition software. Similar sounding words can inadvertently be transcribed and may be missed upon review. Eilleen Kempf, MD 08/02/18

## 2018-08-03 ENCOUNTER — Telehealth: Payer: Self-pay | Admitting: Internal Medicine

## 2018-08-03 ENCOUNTER — Telehealth (HOSPITAL_COMMUNITY): Payer: Self-pay

## 2018-08-03 NOTE — Telephone Encounter (Signed)
Scheduled appt per 6/2 los - pt to get an updated schedule next visit.

## 2018-08-04 ENCOUNTER — Telehealth (HOSPITAL_COMMUNITY): Payer: Self-pay

## 2018-08-09 ENCOUNTER — Other Ambulatory Visit: Payer: Managed Care, Other (non HMO)

## 2018-08-10 ENCOUNTER — Telehealth: Payer: Self-pay | Admitting: Physician Assistant

## 2018-08-10 NOTE — Telephone Encounter (Signed)
Called the patient due to receiving a message from radiology scheduling that they were unable to reach her to scheduled her upcoming CT scan. Unfortunately, the patient's voicemail is full. I also tried to reach out to her husband, her emergency contact, but was unable to reach him as well. I will continue to try to reach the patient.

## 2018-08-16 ENCOUNTER — Other Ambulatory Visit: Payer: Managed Care, Other (non HMO)

## 2018-08-22 ENCOUNTER — Ambulatory Visit (HOSPITAL_COMMUNITY)
Admission: RE | Admit: 2018-08-22 | Discharge: 2018-08-22 | Disposition: A | Discharge status: Home / Self Care | Payer: Managed Care, Other (non HMO) | Source: Ambulatory Visit | Attending: Physician Assistant | Admitting: Physician Assistant

## 2018-08-22 ENCOUNTER — Other Ambulatory Visit: Payer: Self-pay

## 2018-08-22 ENCOUNTER — Encounter (HOSPITAL_COMMUNITY): Payer: Self-pay

## 2018-08-22 DIAGNOSIS — C3492 Malignant neoplasm of unspecified part of left bronchus or lung: Secondary | ICD-10-CM

## 2018-08-22 MED ORDER — HEPARIN SOD (PORK) LOCK FLUSH 100 UNIT/ML IV SOLN
500.0000 [IU] | Freq: Once | INTRAVENOUS | Status: AC
  Administered 2018-08-22: 500 [IU] via INTRAVENOUS

## 2018-08-22 MED ORDER — IOHEXOL 300 MG/ML  SOLN
75.0000 mL | Freq: Once | INTRAMUSCULAR | Status: AC | PRN
  Administered 2018-08-22: 75 mL via INTRAVENOUS

## 2018-08-22 MED ORDER — HEPARIN SOD (PORK) LOCK FLUSH 100 UNIT/ML IV SOLN
INTRAVENOUS | Status: AC
  Filled 2018-08-22: qty 5

## 2018-08-22 MED ORDER — SODIUM CHLORIDE (PF) 0.9 % IJ SOLN
INTRAMUSCULAR | Status: AC
  Filled 2018-08-22: qty 50

## 2018-08-23 ENCOUNTER — Inpatient Hospital Stay: Payer: Managed Care, Other (non HMO)

## 2018-08-23 ENCOUNTER — Inpatient Hospital Stay: Payer: Managed Care, Other (non HMO) | Admitting: Nutrition

## 2018-08-23 ENCOUNTER — Other Ambulatory Visit: Payer: Self-pay

## 2018-08-23 ENCOUNTER — Encounter: Payer: Self-pay | Admitting: Physician Assistant

## 2018-08-23 ENCOUNTER — Inpatient Hospital Stay (HOSPITAL_BASED_OUTPATIENT_CLINIC_OR_DEPARTMENT_OTHER): Payer: Managed Care, Other (non HMO) | Admitting: Physician Assistant

## 2018-08-23 VITALS — HR 99

## 2018-08-23 VITALS — BP 137/96 | HR 110 | Temp 98.7°F | Resp 18 | Ht 66.0 in | Wt 146.8 lb

## 2018-08-23 DIAGNOSIS — Z95828 Presence of other vascular implants and grafts: Secondary | ICD-10-CM

## 2018-08-23 DIAGNOSIS — T451X5A Adverse effect of antineoplastic and immunosuppressive drugs, initial encounter: Secondary | ICD-10-CM

## 2018-08-23 DIAGNOSIS — C3492 Malignant neoplasm of unspecified part of left bronchus or lung: Secondary | ICD-10-CM

## 2018-08-23 DIAGNOSIS — J449 Chronic obstructive pulmonary disease, unspecified: Secondary | ICD-10-CM

## 2018-08-23 DIAGNOSIS — J432 Centrilobular emphysema: Secondary | ICD-10-CM

## 2018-08-23 DIAGNOSIS — I10 Essential (primary) hypertension: Secondary | ICD-10-CM

## 2018-08-23 DIAGNOSIS — Z79899 Other long term (current) drug therapy: Secondary | ICD-10-CM

## 2018-08-23 DIAGNOSIS — C3491 Malignant neoplasm of unspecified part of right bronchus or lung: Secondary | ICD-10-CM

## 2018-08-23 DIAGNOSIS — R0981 Nasal congestion: Secondary | ICD-10-CM

## 2018-08-23 DIAGNOSIS — D6481 Anemia due to antineoplastic chemotherapy: Secondary | ICD-10-CM

## 2018-08-23 DIAGNOSIS — E039 Hypothyroidism, unspecified: Secondary | ICD-10-CM

## 2018-08-23 DIAGNOSIS — Z7952 Long term (current) use of systemic steroids: Secondary | ICD-10-CM

## 2018-08-23 DIAGNOSIS — E785 Hyperlipidemia, unspecified: Secondary | ICD-10-CM

## 2018-08-23 DIAGNOSIS — I2510 Atherosclerotic heart disease of native coronary artery without angina pectoris: Secondary | ICD-10-CM

## 2018-08-23 DIAGNOSIS — J45909 Unspecified asthma, uncomplicated: Secondary | ICD-10-CM

## 2018-08-23 DIAGNOSIS — Z5111 Encounter for antineoplastic chemotherapy: Secondary | ICD-10-CM

## 2018-08-23 DIAGNOSIS — Z7902 Long term (current) use of antithrombotics/antiplatelets: Secondary | ICD-10-CM

## 2018-08-23 DIAGNOSIS — Z7982 Long term (current) use of aspirin: Secondary | ICD-10-CM

## 2018-08-23 DIAGNOSIS — Z7951 Long term (current) use of inhaled steroids: Secondary | ICD-10-CM

## 2018-08-23 LAB — CBC WITH DIFFERENTIAL (CANCER CENTER ONLY)
Abs Immature Granulocytes: 0.01 10*3/uL (ref 0.00–0.07)
Basophils Absolute: 0.1 10*3/uL (ref 0.0–0.1)
Basophils Relative: 1 %
Eosinophils Absolute: 0.5 10*3/uL (ref 0.0–0.5)
Eosinophils Relative: 6 %
HCT: 34 % — ABNORMAL LOW (ref 36.0–46.0)
Hemoglobin: 11.1 g/dL — ABNORMAL LOW (ref 12.0–15.0)
Immature Granulocytes: 0 %
Lymphocytes Relative: 19 %
Lymphs Abs: 1.4 10*3/uL (ref 0.7–4.0)
MCH: 33.7 pg (ref 26.0–34.0)
MCHC: 32.6 g/dL (ref 30.0–36.0)
MCV: 103.3 fL — ABNORMAL HIGH (ref 80.0–100.0)
Monocytes Absolute: 0.9 10*3/uL (ref 0.1–1.0)
Monocytes Relative: 12 %
Neutro Abs: 4.5 10*3/uL (ref 1.7–7.7)
Neutrophils Relative %: 62 %
Platelet Count: 214 10*3/uL (ref 150–400)
RBC: 3.29 MIL/uL — ABNORMAL LOW (ref 3.87–5.11)
RDW: 14.6 % (ref 11.5–15.5)
WBC Count: 7.3 10*3/uL (ref 4.0–10.5)
nRBC: 0 % (ref 0.0–0.2)

## 2018-08-23 LAB — CMP (CANCER CENTER ONLY)
ALT: 32 U/L (ref 0–44)
AST: 32 U/L (ref 15–41)
Albumin: 3.9 g/dL (ref 3.5–5.0)
Alkaline Phosphatase: 96 U/L (ref 38–126)
Anion gap: 9 (ref 5–15)
BUN: 12 mg/dL (ref 8–23)
CO2: 28 mmol/L (ref 22–32)
Calcium: 9.3 mg/dL (ref 8.9–10.3)
Chloride: 104 mmol/L (ref 98–111)
Creatinine: 1.02 mg/dL — ABNORMAL HIGH (ref 0.44–1.00)
GFR, Est AFR Am: >60 mL/min (ref >60)
GFR, Estimated: 58 mL/min — ABNORMAL LOW (ref >60)
Glucose, Bld: 109 mg/dL — ABNORMAL HIGH (ref 70–99)
Potassium: 4 mmol/L (ref 3.5–5.1)
Sodium: 141 mmol/L (ref 135–145)
Total Bilirubin: 0.5 mg/dL (ref 0.3–1.2)
Total Protein: 7 g/dL (ref 6.5–8.1)

## 2018-08-23 MED ORDER — HEPARIN SOD (PORK) LOCK FLUSH 100 UNIT/ML IV SOLN
500.0000 [IU] | Freq: Once | INTRAVENOUS | Status: AC | PRN
  Administered 2018-08-23: 500 [IU]
  Filled 2018-08-23: qty 5

## 2018-08-23 MED ORDER — SODIUM CHLORIDE 0.9 % IV SOLN
475.0000 mg/m2 | Freq: Once | INTRAVENOUS | Status: AC
  Administered 2018-08-23: 900 mg via INTRAVENOUS
  Filled 2018-08-23: qty 16

## 2018-08-23 MED ORDER — METHYLPREDNISOLONE 4 MG PO TBPK: ORAL_TABLET | ORAL | 0 refills | Status: DC

## 2018-08-23 MED ORDER — PROCHLORPERAZINE MALEATE 10 MG PO TABS
ORAL_TABLET | ORAL | Status: AC
  Filled 2018-08-23: qty 1

## 2018-08-23 MED ORDER — SODIUM CHLORIDE 0.9% FLUSH
10.0000 mL | Freq: Once | INTRAVENOUS | Status: AC
  Administered 2018-08-23: 10 mL
  Filled 2018-08-23: qty 10

## 2018-08-23 MED ORDER — IPRATROPIUM-ALBUTEROL 0.5-2.5 (3) MG/3ML IN SOLN: 3.0000 mL | Freq: Four times a day (QID) | RESPIRATORY_TRACT | Status: DC

## 2018-08-23 MED ORDER — SODIUM CHLORIDE 0.9% FLUSH
10.0000 mL | INTRAVENOUS | Status: DC | PRN
  Administered 2018-08-23: 10 mL
  Filled 2018-08-23: qty 10

## 2018-08-23 MED ORDER — PROCHLORPERAZINE MALEATE 10 MG PO TABS
10.0000 mg | ORAL_TABLET | Freq: Once | ORAL | Status: AC
  Administered 2018-08-23: 10 mg via ORAL

## 2018-08-23 MED ORDER — SODIUM CHLORIDE 0.9 % IV SOLN
Freq: Once | INTRAVENOUS | Status: AC
  Administered 2018-08-23: 13:00:00 via INTRAVENOUS
  Filled 2018-08-23: qty 250

## 2018-08-23 NOTE — Progress Notes (Signed)
[facility] OFFICE PROGRESS NOTE  Diana Beals, NP [place] [address]  DIAGNOSIS: stage IV (T1a, N0, M1 a) non-small cell lung cancer, adenocarcinoma presented with multifocal disease in both lungs including 3 nodules in the left lung as well as 2 nodules in the right lung diagnosed in December 2019. No actionable mutations KRAS G12C BCOR N1425S-subclonal RAD21 amplification  PDL 1 expression 1%.  PRIOR THERAPY: None  CURRENT THERAPY: Systemic chemotherapy with carboplatin for AUC of 5, Alimta 500 mg/M2 and Keytruda 200 mg IV every 3 weeks. First dose April 12, 2018.Status post6cycles.Starting from cycle #3, Beryle Flock was dropped and patient received treatment with Carboplatin for an AUC of 5 and Alimta 500 mg/m2 IV every 3 weeks.Starting from cycle #5, the patient will be on maintenance Alimta 500 mg/m2.   INTERVAL HISTORY: [name] 63 y.o. female returns to the clinic for a follow up visit. The patient expresses concern with worsening dyspnea on exertion, wheezing, and increased productive cough over the last two weeks. She has a history of asthma/COPD for which she is prescribed Symbicort and albuterol. She used her inhaler today without relief. She notes some mild associated nasal congestion and "crusty" eyes in the morning which has been present for several weeks due to allergies. She denies any associated fevers, chills, night sweats, chest pain, sore throat, myalgias/arthralgias, nausea/vomiting, or hemoptysis. Otherwise, she continues to tolerate her treatment with single agent maintenance Alimta well without any adverse effects. She has a follow up nutrition appointment today for her past weight loss but she has not lost weight since her last visit. She recently had a restaging CT scan performed. She is here today for evaluation prior to starting cycle #7.   MEDICAL HISTORY: Past Medical History:  Diagnosis Date  . Anemia     "long time ago" (12/06/2012)  . Anxiety   . Arthritis    "right knee real bad; in my hands bad" (12/06/2012)  . Asthma   . Celiac disease   . Colon polyps   . COPD (chronic obstructive pulmonary disease) ([place])   . Coronary artery disease    a. s/p Xience DES to [facility] 04/2008;  b. LHC 05/2008: Proximal RCA 25%, mid RCA stent patent.  c. Anormal nuc 2014 -> s/p LHC with severe mRCA stenosis s/p DES. d. Cath 04/2013 s/p DES to LAD.  e. LHC 08/2015 was stable.  . Depression    "years ago" (12/06/2012)  . Fibromyalgia   . Gallstones   . GERD (gastroesophageal reflux disease)   . H/O hiatal hernia   . Hepatitis    "not A, B, or C" (12/06/2012)  . Hyperlipidemia    intol to statins and other chol agents due to elevated LFTs  . Hypertension   . Hypothyroidism   . IBS (irritable bowel syndrome)   . Interstitial cystitis   . Lung cancer ([facility]) dx'd 01/2018  . Lung nodules    Bilateral  . Migraines    "when I was younger" (12/06/2012)  . Pneumonia   . PONV (postoperative nausea and vomiting)    "Very bad"  . Premature atrial contractions   . PVC's (premature ventricular contractions)   . Sinus headache   . SVT (supraventricular tachycardia) ([facility])     ALLERGIES:  is allergic to ciprofloxacin; levofloxacin; statins; tricor [fenofibrate]; latex; codeine; and metoprolol.  MEDICATIONS:  Current Outpatient Medications  Medication Sig Dispense Refill  . albuterol (PROAIR HFA) 108 (90 Base) MCG/ACT inhaler Inhale 1  puff into the lungs every 6 (six) hours as needed (wheezing/shortness of breath.).     Marland Kitchen albuterol (PROVENTIL) (5 MG/ML) 0.5% nebulizer solution Take 2.5 mg by nebulization every 6 (six) hours as needed for wheezing.    Marland Kitchen aspirin 81 MG chewable tablet Chew 1 tablet (81 mg total) by mouth daily.    . BELSOMRA 20 MG TABS Take 20 mg by mouth at bedtime as needed for sleep.    Marland Kitchen buPROPion (WELLBUTRIN SR) 150 MG 12 hr tablet Take 150-300 mg by mouth See admin instructions. Take 2 tablets (300  mg) by mouth daily in the morning & take 1 tablet (150 mg) by mouth in the evening.    . clopidogrel (PLAVIX) 75 MG tablet Take 1 tablet (75 mg total) by mouth daily. MUST SCHEDULE APPOINTMENT FOR FURTHER REFILLS 30 tablet 0  . DULoxetine (CYMBALTA) 60 MG capsule Take 120 mg by mouth daily.    . ergocalciferol (VITAMIN D2) 50000 UNITS capsule Take 50,000 Units by mouth every Wednesday.     . folic acid (FOLVITE) 1 MG tablet Take 1 tablet (1 mg total) by mouth daily. 30 tablet 4  . HYDROcodone-acetaminophen (NORCO) 7.5-325 MG per tablet Take 1 tablet by mouth every 6 (six) hours as needed for pain.    Marland Kitchen levothyroxine (SYNTHROID, LEVOTHROID) 25 MCG tablet Take 25 mcg by mouth daily before breakfast.    . lidocaine-prilocaine (EMLA) cream Apply 1 application topically as needed. 30 g 0  . loperamide (IMODIUM) 2 MG capsule Take 1 capsule (2 mg total) by mouth as needed for diarrhea or loose stools (Initiate if stool C diff -ve). 30 capsule 0  . LORazepam (ATIVAN) 2 MG tablet Take 2 mg by mouth See admin instructions. Take 1 tablet (2 mg) by mouth scheduled at bedtime & take 1 tablet (2 mg) by mouth twice daily if needed for anxiety    . metoprolol tartrate (LOPRESSOR) 25 MG tablet Take 1 tablet (25 mg total) by mouth 2 (two) times daily. 180 tablet 3  . montelukast (SINGULAIR) 10 MG tablet Take 10 mg by mouth at bedtime.     . nitroGLYCERIN (NITROSTAT) 0.4 MG SL tablet Place 1 tablet (0.4 mg total) under the tongue every 5 (five) minutes as needed for chest pain. 25 tablet 12  . omeprazole (PRILOSEC) 40 MG capsule Take 1 tablet by mouth twice a day for 2 weeks then 1 tablet every morning thereafter 45 capsule 8  . ondansetron (ZOFRAN ODT) 4 MG disintegrating tablet Take 1 tablet (4 mg total) by mouth every 8 (eight) hours as needed for nausea or vomiting. 20 tablet 0  . ondansetron (ZOFRAN) 8 MG tablet Take 1 tablet (8 mg total) by mouth every 8 (eight) hours as needed for nausea or vomiting. 30 tablet 0   . prochlorperazine (COMPAZINE) 10 MG tablet TAKE 1 TABLET(10 MG) BY MOUTH EVERY 6 HOURS AS NEEDED FOR NAUSEA OR VOMITING 30 tablet 0  . SYMBICORT 160-4.5 MCG/ACT inhaler Take 2 puffs by mouth 2 (two) times daily.  3  . temazepam (RESTORIL) 15 MG capsule Take 15 mg by mouth at bedtime.     . vitamin B-12 100 MCG tablet Take 1 tablet (100 mcg total) by mouth daily. 30 tablet 1  . amLODipine (NORVASC) 5 MG tablet Take 1.5 tablets (7.5 mg total) by mouth daily. (Patient taking differently: Take 7.5 mg by mouth every evening. ) 180 tablet 3  . cefdinir (OMNICEF) 300 MG capsule Take 1 capsule by mouth  every 12 (twelve) hours.    . methylPREDNISolone (MEDROL DOSEPAK) 4 MG TBPK tablet Use as instructed 21 tablet 0  . predniSONE (DELTASONE) 20 MG tablet Take 1 tablet by mouth See admin instructions. Take TID for 3 days then taper, take BID for 3 days, then take once daily for 3 days     No current facility-administered medications for this visit.    Facility-Administered Medications Ordered in Other Visits  Medication Dose Route Frequency Provider Last Rate Last Dose  . heparin lock flush 100 unit/mL  500 Units Intracatheter Once PRN Curt Bears, MD      . PEMEtrexed (ALIMTA) 900 mg in sodium chloride 0.9 % 100 mL chemo infusion  475 mg/m2 (Treatment Plan Recorded) Intravenous Once Curt Bears, MD      . sodium chloride flush (NS) 0.9 % injection 10 mL  10 mL Intracatheter PRN Curt Bears, MD        SURGICAL HISTORY:  Past Surgical History:  Procedure Laterality Date  . CARDIAC CATHETERIZATION  04/2008; 05/2008  . CARDIAC CATHETERIZATION N/A 08/07/2015   Procedure: Left Heart Cath and Coronary Angiography;  Surgeon: Sherren Mocha, MD;  Location: [facility] CV LAB;  Service: Cardiovascular;  Laterality: N/A;  . CHOLECYSTECTOMY    . CORONARY ANGIOPLASTY WITH STENT PLACEMENT  04/2008 12/06/2012   "1 + 1" (12/06/2012)  . CORONARY STENT PLACEMENT  05/08/2013   DES TO LAD      DR MCALHANY   . IR IMAGING GUIDED PORT INSERTION  04/29/2018  . LEFT HEART CATHETERIZATION WITH CORONARY ANGIOGRAM N/A 12/06/2012   Procedure: LEFT HEART CATHETERIZATION WITH CORONARY ANGIOGRAM;  Surgeon: [name], MD;  Location: [facility] CATH LAB;  Service: Cardiovascular;  Laterality: N/A;  . LEFT HEART CATHETERIZATION WITH CORONARY ANGIOGRAM N/A 05/08/2013   Procedure: LEFT HEART CATHETERIZATION WITH CORONARY ANGIOGRAM;  Surgeon: [name], MD;  Location: [facility] CATH LAB;  Service: Cardiovascular;  Laterality: N/A;  . LEFT HEART CATHETERIZATION WITH CORONARY ANGIOGRAM N/A 01/04/2014   Procedure: LEFT HEART CATHETERIZATION WITH CORONARY ANGIOGRAM;  Surgeon: [name], MD;  Location: [facility] CATH LAB;  Service: Cardiovascular;  Laterality: N/A;  . PERCUTANEOUS CORONARY STENT INTERVENTION (PCI-S)  12/06/2012   Procedure: PERCUTANEOUS CORONARY STENT INTERVENTION (PCI-S);  Surgeon: [name], MD;  Location: [facility] CATH LAB;  Service: Cardiovascular;;  . PERCUTANEOUS CORONARY STENT INTERVENTION (PCI-S)  05/08/2013   Procedure: PERCUTANEOUS CORONARY STENT INTERVENTION (PCI-S);  Surgeon: [name], MD;  Location[facility] C[facility] LAB;  Service: Cardiovascular;;  mid LAD   . TUBAL LIGATION    . VAGINAL HYSTERECTOMY    . VIDEO BRONCHOSCOPY WITH ENDOBRONCHIAL NAVIGATION N/A 02/17/2018   Procedure: VIDEO BRONCHOSCOPY WITH ENDOBRONCHIAL NAVIGATION;  Surgeon: Melrose Nakayama, MD;  Location: [facility];  Service: Thoracic;  Laterality: N/A;    REVIEW OF SYSTEMS:   Review of Systems  Constitutional: Negative for appetite change, chills, fatigue, fever and unexpected weight change.  HENT:   Negative for mouth sores, nosebleeds, sore throat and trouble swallowing.   Eyes: Positive for bilateral eye "crusting" in the morning. Negative for icterus.  Respiratory: Positive for productive cough, wheezing, and increased dyspnea on exertion. Negative for hemoptysis.    Cardiovascular: Negative  for chest pain and leg swelling.  Gastrointestinal: Negative for abdominal pain, constipation, diarrhea, nausea and vomiting.  Genitourinary: Negative for bladder incontinence, difficulty urinating, dysuria, frequency and hematuria.   Musculoskeletal: Negative for back pain, gait problem, neck pain and neck stiffness.  Skin: Negative for itching  and rash.  Neurological: Negative for dizziness, extremity weakness, gait problem, headaches, light-headedness and seizures.  Hematological: Negative for adenopathy. Does not bruise/bleed easily.  Psychiatric/Behavioral: Negative for confusion, depression and sleep disturbance. The patient is not nervous/anxious.     PHYSICAL EXAMINATION:  Blood pressure (!) 137/96, pulse (!) 110, temperature 98.7 F (37.1 C), temperature source Temporal, resp. rate 18, height 5' 6" (1.676 m), weight 146 lb 12.8 oz (66.6 kg), SpO2 96 %.  ECOG PERFORMANCE STATUS: 1 - Symptomatic but completely ambulatory  Physical Exam  Constitutional: Oriented to person, place, and time and well-developed, well-nourished, and in no distress.  HENT:  Head: Normocephalic and atraumatic.  Mouth/Throat: Oropharynx is clear and moist. No oropharyngeal exudate.  Eyes: Conjunctivae are normal. Right eye exhibits no discharge. Left eye exhibits no discharge. No scleral icterus.  Neck: Normal range of motion. Neck supple.  Cardiovascular: Normal rate, regular rhythm, normal heart sounds and intact distal pulses.   Pulmonary/Chest: Diffused wheezing in all lung fields. Effort normal and breath sounds normal. No respiratory distress. No wheezes. No rales.  Abdominal: Soft. Bowel sounds are normal. Exhibits no distension and no mass. There is no tenderness.  Musculoskeletal: Normal range of motion. Exhibits no edema.  Lymphadenopathy:    No cervical adenopathy.  Neurological: Alert and oriented to person, place, and time. Exhibits normal muscle tone. Gait normal. Coordination normal.  Skin:  Skin is warm and dry. No rash noted. Not diaphoretic. No erythema. No pallor.  Psychiatric: Mood, memory and judgment normal.  Vitals reviewed.  LABORATORY DATA: Lab Results  Component Value Date   WBC 7.3 08/23/2018   HGB 11.1 (L) 08/23/2018   HCT 34.0 (L) 08/23/2018   MCV 103.3 (H) 08/23/2018   PLT 214 08/23/2018      Chemistry      Component Value Date/Time   NA 141 08/23/2018 1033   NA 143 01/10/2018 1629   K 4.0 08/23/2018 1033   CL 104 08/23/2018 1033   CO2 28 08/23/2018 1033   BUN 12 08/23/2018 1033   BUN 14 01/10/2018 1629   CREATININE 1.02 (H) 08/23/2018 1033   CREATININE 0.68 08/05/2015 1456      Component Value Date/Time   CALCIUM 9.3 08/23/2018 1033   ALKPHOS 96 08/23/2018 1033   AST 32 08/23/2018 1033   ALT 32 08/23/2018 1033   BILITOT 0.5 08/23/2018 1033       RADIOGRAPHIC STUDIES:  Ct Chest W Contrast  Result Date: 08/23/2018 CLINICAL DATA:  Stage IV lung cancer.  Immunotherapy. EXAM: CT CHEST WITH CONTRAST TECHNIQUE: Multidetector CT imaging of the chest was performed during intravenous contrast administration. CONTRAST:  35m OMNIPAQUE IOHEXOL 300 MG/ML  SOLN COMPARISON:  PET-CT 02/03/2018.  Chest CT 01/12/2018 FINDINGS: Cardiovascular: The heart size is normal. No substantial pericardial effusion. Coronary artery calcification is evident. Atherosclerotic calcification is noted in the wall of the thoracic aorta. Right Port-A-Cath tip is positioned in the distal SVC. Mediastinum/Nodes: No mediastinal lymphadenopathy. There is no hilar lymphadenopathy. The esophagus has normal imaging features. There is no axillary lymphadenopathy. Lungs/Pleura: The central tracheobronchial airways are patent. Centrilobular emphsyema noted. As before, scattered ground-glass nodules are identified bilaterally. 9 mm right upper lobe nodule (51/7) was 10 mm on previous CT chest of 01/12/2018. 2.6 cm posterior left lower lobe ground-glass nodule (49/7) was 2.5 cm previously. 9 mm  subpleural sub solid opacity in the anterior right upper lobe (61/7) is stable. 15 mm ground-glass nodule in the posterior left upper lobe (78/7) is stable.  Other scattered tiny solid and sub solid nodules are stable. No new suspicious pulmonary nodule or mass. No pleural effusion. Upper Abdomen: Unremarkable. Musculoskeletal: No worrisome lytic or sclerotic osseous abnormality. T5 superior endplate compression fracture is new in the interval resulting in 25-50% loss of height anteriorly. IMPRESSION: 1. Stable bilateral pulmonary ground-glass opacities. No new or progressive findings on today's study. 2.  Aortic Atherosclerois (ICD10-170.0) 3.  Emphysema. ([id number]) Electronically Signed   By: Misty Stanley M.D.   On: 08/23/2018 07:59     ASSESSMENT/PLAN:  This is a very pleasant 63 year old Caucasian female who was diagnosed with stage IV non-small cell lung cancer, adenocarcinoma.  She has no actionable mutations and her PDL 1 expression is 1%.  She was diagnosed in December 2019.  She presented with bilateral pulmonary nodules.  She is currently undergoing treatment with carboplatin for an AUC 5, Alimta 500 mg/m, and Keytruda 200 mg IV every 3 weeks.  Beryle Flock was dropped after cycle #2 due to intolerability.  She is status post 6 cycles.  Starting from cycle #5, the patient will be undergoing treatment with maintenance single agent Alimta 500 mg/m.   The patient recently had a restaging CT scan performed. Dr. Julien Nordmann personally and independently reviewed the scan and discussed the results with the patient. The scan did not show any evidence for disease progression. Dr. Julien Nordmann recommends that she proceed with cycle #7 today as scheduled.   Unfortunately, regarding the patient's shortness of breath/wheezing due to her history of asthma/COPD, we are unable to do a nebulizer treatment in the clinic due to restrictions placed on aerosol agents due to COVID-19. The patient has her albuterol inhaler  with her today to use. Once she returns home, she was advised to take a nebulizer breathing treatment. Additionally, I have sent a medrol dosepak to her pharmacy. The patient was advised to seek medical evaluation if she should develop new or worsening symptoms.   We will see the patient back for a follow up visit in 3 weeks for evaluation prior to starting cycle #8.   The patient will follow up with nutrition today as scheduled. She was alerted that she has an appointment scheduled for today via the telephone.   The patient was advised to call immediately if she has any concerning symptoms in the interval. The patient voices understanding of current disease status and treatment options and is in agreement with the current care plan. All questions were answered. The patient knows to call the clinic with any problems, questions or concerns. We can certainly see the patient much sooner if necessary  No orders of the defined types were placed in this encounter.    Diana Keeling L Valery Amedee, PA-C 08/23/18  ADDENDUM: Hematology/Oncology Attending: I had a face-to-face encounter with the patient today.  I recommended her care plan.  This is a very pleasant 63 years old white female with a stage IV non-small cell lung cancer and she is currently undergoing treatment with carboplatin and Alimta status post 6 cycles.  Her treatment with Beryle Flock was discontinued after cycle #3 secondary significant adverse effects.  Starting from cycle #5 the patient is currently on maintenance treatment with single agent Alimta and has been tolerating it fairly well. She had repeat CT scan of the chest, abdomen and pelvis performed recently.  I personally and independently reviewed the scans and discussed the results with the patient today. Her scan showed no concerning findings for disease progression. I recommended for the patient to continue her  current treatment with maintenance Alimta and she will proceed with  cycle #7 today. For the shortness of breath and extensive wheezing history of asthma/COPD, the patient was advised to continue her treatment with albuterol inhaler and we also started the patient on Medrol Dosepak.  She was also advised to go immediately to the emergency department if she has any worsening of her condition. The patient will come back for follow-up visit in 3 weeks for evaluation before the next cycle of her treatment. She was advised to call immediately if she has any concerning symptoms in the interval.  Disclaimer: This note was dictated with voice recognition software. Similar sounding words can inadvertently be transcribed and may be missed upon review. Eilleen Kempf, MD 08/23/18

## 2018-08-23 NOTE — Patient Instructions (Signed)
Coronavirus (COVID-19) Are you at risk?  Are you at risk for the Coronavirus (COVID-19)?  To be considered HIGH RISK for Coronavirus (COVID-19), you have to meet the following criteria:  . Traveled to [place], [place], [place], [place] or [place]; or in the [place] to [place], [place], [place], or [place]; and have fever, cough, and shortness of breath within the last 2 weeks of travel OR . Been in close contact with a person diagnosed with COVID-19 within the last 2 weeks and have fever, cough, and shortness of breath . IF YOU DO NOT MEET THESE CRITERIA, YOU ARE CONSIDERED LOW RISK FOR COVID-19.  What to do if you are HIGH RISK for COVID-19?  Marland Kitchen If you are having a medical emergency, call 911. . Seek medical care right away. Before you go to a doctor's office, urgent care or emergency department, call ahead and tell them about your recent travel, contact with someone diagnosed with COVID-19, and your symptoms. You should receive instructions from your physician's office regarding next steps of care.  . When you arrive at healthcare provider, tell the healthcare staff immediately you have returned from visiting [place], [place], [place], [place] or [place]; or traveled in the [place] to [place], [place], [place], or [place]; in the last two weeks or you have been in close contact with a person diagnosed with COVID-19 in the last 2 weeks.   . Tell the health care staff about your symptoms: fever, cough and shortness of breath. . After you have been seen by a medical provider, you will be either: o Tested for (COVID-19) and discharged home on quarantine except to seek medical care if symptoms worsen, and asked to  - Stay home and avoid contact with others until you get your results (4-5 days)  - Avoid travel on public transportation if possible (such as bus, train, or airplane) or o Sent to the Emergency Department by EMS for evaluation, COVID-19 testing, and possible  admission depending on your condition and test results.  What to do if you are LOW RISK for COVID-19?  Reduce your risk of any infection by using the same precautions used for avoiding the common cold or flu:  Marland Kitchen Wash your hands often with soap and warm water for at least 20 seconds.  If soap and water are not readily available, use an alcohol-based hand sanitizer with at least 60% alcohol.  . If coughing or sneezing, cover your mouth and nose by coughing or sneezing into the elbow areas of your shirt or coat, into a tissue or into your sleeve (not your hands). . Avoid shaking hands with others and consider head nods or verbal greetings only. . Avoid touching your eyes, nose, or mouth with unwashed hands.  . Avoid close contact with people who are sick. . Avoid places or events with large numbers of people in one location, like concerts or sporting events. . Carefully consider travel plans you have or are making. . If you are planning any travel outside or inside the [place], visit the CDC's Travelers' Health webpage for the latest health notices. . If you have some symptoms but not all symptoms, continue to monitor at home and seek medical attention if your symptoms worsen. . If you are having a medical emergency, call 911.   [place] / e-Visit: [email or web address]         [facility]: [place]  [facility]: [facility]: [place] Discharge Instructions for Patients Receiving Chemotherapy  Today you received the following chemotherapy agents Alimta  To help prevent nausea and vomiting after your treatment, we encourage you to take your nausea medication as directed.    If you develop nausea and vomiting that is not controlled by your nausea medication, call the clinic.   BELOW ARE  SYMPTOMS THAT SHOULD BE REPORTED IMMEDIATELY:  *FEVER GREATER THAN 100.5 F  *CHILLS WITH OR WITHOUT FEVER  NAUSEA AND VOMITING THAT IS NOT CONTROLLED WITH YOUR NAUSEA MEDICATION  *UNUSUAL SHORTNESS OF BREATH  *UNUSUAL BRUISING OR BLEEDING  TENDERNESS IN MOUTH AND THROAT WITH OR WITHOUT PRESENCE OF ULCERS  *URINARY PROBLEMS  *BOWEL PROBLEMS  UNUSUAL RASH Items with * indicate a potential emergency and should be followed up as soon as possible.  Feel free to call the clinic should you have any questions or concerns. The clinic phone number is [phone].  Please show the [place] at check-in to the Emergency Department and triage nurse.

## 2018-08-23 NOTE — Progress Notes (Signed)
RD working remotely.  Attempted to contact patient however, she was unavailable. I was unable to leave a message because her mailbox is full. Will attempt to connect with patient at another time.

## 2018-08-29 ENCOUNTER — Inpatient Hospital Stay: Payer: Managed Care, Other (non HMO) | Admitting: Nutrition

## 2018-08-29 NOTE — Progress Notes (Signed)
RD working remotely.  Contacted patient by telephone but she was unavailable. Left message with name and number to call me back.

## 2018-09-13 ENCOUNTER — Other Ambulatory Visit: Payer: Managed Care, Other (non HMO)

## 2018-09-13 ENCOUNTER — Ambulatory Visit: Payer: Managed Care, Other (non HMO)

## 2018-09-13 ENCOUNTER — Telehealth: Payer: Self-pay | Admitting: *Deleted

## 2018-09-13 ENCOUNTER — Ambulatory Visit: Payer: Managed Care, Other (non HMO) | Admitting: Internal Medicine

## 2018-09-13 NOTE — Telephone Encounter (Signed)
No. she needs to reschedule her treatment in the next few days after she feels better.

## 2018-09-13 NOTE — Telephone Encounter (Signed)
[place].  Scheduling request sent to re-schedule her appts

## 2018-09-13 NOTE — Telephone Encounter (Signed)
Received vm message from patient stating that she did not come in for her appts today as she woke up with conjunctivitis. OK to re-schedule and ok to miss this treatment and come in as scheduled for 10/04/18?  Please advise.

## 2018-09-15 ENCOUNTER — Inpatient Hospital Stay: Payer: Managed Care, Other (non HMO) | Admitting: Physician Assistant

## 2018-09-15 ENCOUNTER — Inpatient Hospital Stay: Payer: Managed Care, Other (non HMO)

## 2018-09-15 ENCOUNTER — Telehealth: Payer: Self-pay | Admitting: *Deleted

## 2018-09-15 NOTE — Telephone Encounter (Signed)
Pt called lmovm I'm not going to make it today for my appts. Please cancel them and I will come at my next regular appt on 8/4. Message to scheduling.

## 2018-10-04 ENCOUNTER — Inpatient Hospital Stay (HOSPITAL_COMMUNITY)
Admission: EM | Admit: 2018-10-04 | Discharge: 2018-10-08 | DRG: 683 | Disposition: A | Discharge status: Home / Self Care | Payer: Managed Care, Other (non HMO) | Attending: Family Medicine | Admitting: Family Medicine

## 2018-10-04 ENCOUNTER — Encounter (HOSPITAL_COMMUNITY): Payer: Self-pay

## 2018-10-04 ENCOUNTER — Inpatient Hospital Stay: Payer: Managed Care, Other (non HMO) | Attending: Internal Medicine | Admitting: Physician Assistant

## 2018-10-04 ENCOUNTER — Inpatient Hospital Stay: Payer: Managed Care, Other (non HMO)

## 2018-10-04 ENCOUNTER — Emergency Department (HOSPITAL_COMMUNITY): Payer: Managed Care, Other (non HMO)

## 2018-10-04 ENCOUNTER — Other Ambulatory Visit: Payer: Self-pay

## 2018-10-04 VITALS — BP 143/82 | HR 92 | Temp 98.2°F | Resp 18 | Ht 66.0 in | Wt 147.6 lb

## 2018-10-04 DIAGNOSIS — N12 Tubulo-interstitial nephritis, not specified as acute or chronic: Secondary | ICD-10-CM | POA: Diagnosis not present

## 2018-10-04 DIAGNOSIS — N179 Acute kidney failure, unspecified: Principal | ICD-10-CM

## 2018-10-04 DIAGNOSIS — K219 Gastro-esophageal reflux disease without esophagitis: Secondary | ICD-10-CM | POA: Diagnosis present

## 2018-10-04 DIAGNOSIS — E785 Hyperlipidemia, unspecified: Secondary | ICD-10-CM | POA: Insufficient documentation

## 2018-10-04 DIAGNOSIS — Z79899 Other long term (current) drug therapy: Secondary | ICD-10-CM

## 2018-10-04 DIAGNOSIS — Z7982 Long term (current) use of aspirin: Secondary | ICD-10-CM

## 2018-10-04 DIAGNOSIS — N390 Urinary tract infection, site not specified: Secondary | ICD-10-CM

## 2018-10-04 DIAGNOSIS — Z8719 Personal history of other diseases of the digestive system: Secondary | ICD-10-CM

## 2018-10-04 DIAGNOSIS — T451X5A Adverse effect of antineoplastic and immunosuppressive drugs, initial encounter: Secondary | ICD-10-CM

## 2018-10-04 DIAGNOSIS — D638 Anemia in other chronic diseases classified elsewhere: Secondary | ICD-10-CM | POA: Diagnosis present

## 2018-10-04 DIAGNOSIS — F419 Anxiety disorder, unspecified: Secondary | ICD-10-CM | POA: Insufficient documentation

## 2018-10-04 DIAGNOSIS — F329 Major depressive disorder, single episode, unspecified: Secondary | ICD-10-CM | POA: Diagnosis present

## 2018-10-04 DIAGNOSIS — D696 Thrombocytopenia, unspecified: Secondary | ICD-10-CM

## 2018-10-04 DIAGNOSIS — Z813 Family history of other psychoactive substance abuse and dependence: Secondary | ICD-10-CM

## 2018-10-04 DIAGNOSIS — Z20828 Contact with and (suspected) exposure to other viral communicable diseases: Secondary | ICD-10-CM | POA: Diagnosis present

## 2018-10-04 DIAGNOSIS — Z9221 Personal history of antineoplastic chemotherapy: Secondary | ICD-10-CM | POA: Insufficient documentation

## 2018-10-04 DIAGNOSIS — Z8279 Family history of other congenital malformations, deformations and chromosomal abnormalities: Secondary | ICD-10-CM

## 2018-10-04 DIAGNOSIS — R197 Diarrhea, unspecified: Secondary | ICD-10-CM | POA: Insufficient documentation

## 2018-10-04 DIAGNOSIS — I10 Essential (primary) hypertension: Secondary | ICD-10-CM | POA: Insufficient documentation

## 2018-10-04 DIAGNOSIS — Z79890 Hormone replacement therapy (postmenopausal): Secondary | ICD-10-CM

## 2018-10-04 DIAGNOSIS — Z90710 Acquired absence of both cervix and uterus: Secondary | ICD-10-CM

## 2018-10-04 DIAGNOSIS — D6481 Anemia due to antineoplastic chemotherapy: Secondary | ICD-10-CM

## 2018-10-04 DIAGNOSIS — I471 Supraventricular tachycardia, unspecified: Secondary | ICD-10-CM | POA: Diagnosis present

## 2018-10-04 DIAGNOSIS — E039 Hypothyroidism, unspecified: Secondary | ICD-10-CM | POA: Insufficient documentation

## 2018-10-04 DIAGNOSIS — Z825 Family history of asthma and other chronic lower respiratory diseases: Secondary | ICD-10-CM

## 2018-10-04 DIAGNOSIS — J449 Chronic obstructive pulmonary disease, unspecified: Secondary | ICD-10-CM | POA: Insufficient documentation

## 2018-10-04 DIAGNOSIS — C3490 Malignant neoplasm of unspecified part of unspecified bronchus or lung: Secondary | ICD-10-CM

## 2018-10-04 DIAGNOSIS — Z95828 Presence of other vascular implants and grafts: Secondary | ICD-10-CM

## 2018-10-04 DIAGNOSIS — Z955 Presence of coronary angioplasty implant and graft: Secondary | ICD-10-CM

## 2018-10-04 DIAGNOSIS — R102 Pelvic and perineal pain: Secondary | ICD-10-CM | POA: Diagnosis not present

## 2018-10-04 DIAGNOSIS — C3492 Malignant neoplasm of unspecified part of left bronchus or lung: Secondary | ICD-10-CM | POA: Diagnosis not present

## 2018-10-04 DIAGNOSIS — N10 Acute tubulo-interstitial nephritis: Secondary | ICD-10-CM

## 2018-10-04 DIAGNOSIS — Z8379 Family history of other diseases of the digestive system: Secondary | ICD-10-CM

## 2018-10-04 DIAGNOSIS — C3491 Malignant neoplasm of unspecified part of right bronchus or lung: Secondary | ICD-10-CM | POA: Insufficient documentation

## 2018-10-04 DIAGNOSIS — M797 Fibromyalgia: Secondary | ICD-10-CM | POA: Diagnosis present

## 2018-10-04 DIAGNOSIS — R7989 Other specified abnormal findings of blood chemistry: Secondary | ICD-10-CM | POA: Insufficient documentation

## 2018-10-04 DIAGNOSIS — R350 Frequency of micturition: Secondary | ICD-10-CM | POA: Insufficient documentation

## 2018-10-04 DIAGNOSIS — R062 Wheezing: Secondary | ICD-10-CM

## 2018-10-04 DIAGNOSIS — Z7951 Long term (current) use of inhaled steroids: Secondary | ICD-10-CM

## 2018-10-04 DIAGNOSIS — Z66 Do not resuscitate: Secondary | ICD-10-CM | POA: Diagnosis present

## 2018-10-04 DIAGNOSIS — Z833 Family history of diabetes mellitus: Secondary | ICD-10-CM

## 2018-10-04 DIAGNOSIS — K900 Celiac disease: Secondary | ICD-10-CM | POA: Diagnosis present

## 2018-10-04 DIAGNOSIS — N3010 Interstitial cystitis (chronic) without hematuria: Secondary | ICD-10-CM | POA: Diagnosis present

## 2018-10-04 DIAGNOSIS — Z85118 Personal history of other malignant neoplasm of bronchus and lung: Secondary | ICD-10-CM

## 2018-10-04 DIAGNOSIS — J4520 Mild intermittent asthma, uncomplicated: Secondary | ICD-10-CM | POA: Diagnosis present

## 2018-10-04 DIAGNOSIS — Z881 Allergy status to other antibiotic agents status: Secondary | ICD-10-CM

## 2018-10-04 DIAGNOSIS — Z87891 Personal history of nicotine dependence: Secondary | ICD-10-CM

## 2018-10-04 DIAGNOSIS — Z91040 Latex allergy status: Secondary | ICD-10-CM

## 2018-10-04 DIAGNOSIS — M1990 Unspecified osteoarthritis, unspecified site: Secondary | ICD-10-CM | POA: Insufficient documentation

## 2018-10-04 DIAGNOSIS — I2510 Atherosclerotic heart disease of native coronary artery without angina pectoris: Secondary | ICD-10-CM | POA: Diagnosis present

## 2018-10-04 DIAGNOSIS — J45909 Unspecified asthma, uncomplicated: Secondary | ICD-10-CM | POA: Diagnosis present

## 2018-10-04 DIAGNOSIS — Z79891 Long term (current) use of opiate analgesic: Secondary | ICD-10-CM

## 2018-10-04 DIAGNOSIS — Z800 Family history of malignant neoplasm of digestive organs: Secondary | ICD-10-CM

## 2018-10-04 DIAGNOSIS — Z9225 Personal history of immunosupression therapy: Secondary | ICD-10-CM | POA: Insufficient documentation

## 2018-10-04 DIAGNOSIS — Z885 Allergy status to narcotic agent status: Secondary | ICD-10-CM

## 2018-10-04 DIAGNOSIS — Z888 Allergy status to other drugs, medicaments and biological substances status: Secondary | ICD-10-CM

## 2018-10-04 DIAGNOSIS — R5383 Other fatigue: Secondary | ICD-10-CM | POA: Insufficient documentation

## 2018-10-04 DIAGNOSIS — R110 Nausea: Secondary | ICD-10-CM | POA: Insufficient documentation

## 2018-10-04 DIAGNOSIS — Z9049 Acquired absence of other specified parts of digestive tract: Secondary | ICD-10-CM

## 2018-10-04 LAB — CMP (CANCER CENTER ONLY)
ALT: 26 U/L (ref 0–44)
AST: 24 U/L (ref 15–41)
Albumin: 3.8 g/dL (ref 3.5–5.0)
Alkaline Phosphatase: 111 U/L (ref 38–126)
Anion gap: 12 (ref 5–15)
BUN: 24 mg/dL — ABNORMAL HIGH (ref 8–23)
CO2: 23 mmol/L (ref 22–32)
Calcium: 9.2 mg/dL (ref 8.9–10.3)
Chloride: 106 mmol/L (ref 98–111)
Creatinine: 2.93 mg/dL — ABNORMAL HIGH (ref 0.44–1.00)
GFR, Est AFR Am: 19 mL/min — ABNORMAL LOW (ref >60)
GFR, Estimated: 16 mL/min — ABNORMAL LOW (ref >60)
Glucose, Bld: 160 mg/dL — ABNORMAL HIGH (ref 70–99)
Potassium: 3.7 mmol/L (ref 3.5–5.1)
Sodium: 141 mmol/L (ref 135–145)
Total Bilirubin: 0.4 mg/dL (ref 0.3–1.2)
Total Protein: 7.4 g/dL (ref 6.5–8.1)

## 2018-10-04 LAB — URINALYSIS, ROUTINE W REFLEX MICROSCOPIC
Bilirubin Urine: NEGATIVE
Glucose, UA: NEGATIVE mg/dL
Ketones, ur: NEGATIVE mg/dL
Nitrite: NEGATIVE
Protein, ur: 100 mg/dL — AB
Specific Gravity, Urine: 1.016 (ref 1.005–1.030)
WBC, UA: >50 WBC/hpf — ABNORMAL HIGH (ref 0–5)
pH: 6 (ref 5.0–8.0)

## 2018-10-04 LAB — CBC WITH DIFFERENTIAL (CANCER CENTER ONLY)
Abs Immature Granulocytes: 0.05 10*3/uL (ref 0.00–0.07)
Basophils Absolute: 0.1 10*3/uL (ref 0.0–0.1)
Basophils Relative: 1 %
Eosinophils Absolute: 0.4 10*3/uL (ref 0.0–0.5)
Eosinophils Relative: 4 %
HCT: 33.3 % — ABNORMAL LOW (ref 36.0–46.0)
Hemoglobin: 11.3 g/dL — ABNORMAL LOW (ref 12.0–15.0)
Immature Granulocytes: 1 %
Lymphocytes Relative: 15 %
Lymphs Abs: 1.6 10*3/uL (ref 0.7–4.0)
MCH: 32.8 pg (ref 26.0–34.0)
MCHC: 33.9 g/dL (ref 30.0–36.0)
MCV: 96.8 fL (ref 80.0–100.0)
Monocytes Absolute: 0.7 10*3/uL (ref 0.1–1.0)
Monocytes Relative: 7 %
Neutro Abs: 7.9 10*3/uL — ABNORMAL HIGH (ref 1.7–7.7)
Neutrophils Relative %: 72 %
Platelet Count: 193 10*3/uL (ref 150–400)
RBC: 3.44 MIL/uL — ABNORMAL LOW (ref 3.87–5.11)
RDW: 12.5 % (ref 11.5–15.5)
WBC Count: 10.8 10*3/uL — ABNORMAL HIGH (ref 4.0–10.5)
nRBC: 0 % (ref 0.0–0.2)

## 2018-10-04 LAB — CBC WITH DIFFERENTIAL/PLATELET
Abs Immature Granulocytes: 0.04 10*3/uL (ref 0.00–0.07)
Basophils Absolute: 0.1 10*3/uL (ref 0.0–0.1)
Basophils Relative: 1 %
Eosinophils Absolute: 0.4 10*3/uL (ref 0.0–0.5)
Eosinophils Relative: 4 %
HCT: 34.1 % — ABNORMAL LOW (ref 36.0–46.0)
Hemoglobin: 11.5 g/dL — ABNORMAL LOW (ref 12.0–15.0)
Immature Granulocytes: 0 %
Lymphocytes Relative: 15 %
Lymphs Abs: 1.7 10*3/uL (ref 0.7–4.0)
MCH: 33.6 pg (ref 26.0–34.0)
MCHC: 33.7 g/dL (ref 30.0–36.0)
MCV: 99.7 fL (ref 80.0–100.0)
Monocytes Absolute: 0.9 10*3/uL (ref 0.1–1.0)
Monocytes Relative: 8 %
Neutro Abs: 8.1 10*3/uL — ABNORMAL HIGH (ref 1.7–7.7)
Neutrophils Relative %: 72 %
Platelets: 201 10*3/uL (ref 150–400)
RBC: 3.42 MIL/uL — ABNORMAL LOW (ref 3.87–5.11)
RDW: 12.9 % (ref 11.5–15.5)
WBC: 11.1 10*3/uL — ABNORMAL HIGH (ref 4.0–10.5)
nRBC: 0 % (ref 0.0–0.2)

## 2018-10-04 LAB — COMPREHENSIVE METABOLIC PANEL
ALT: 26 U/L (ref 0–44)
AST: 25 U/L (ref 15–41)
Albumin: 4.3 g/dL (ref 3.5–5.0)
Alkaline Phosphatase: 108 U/L (ref 38–126)
Anion gap: 10 (ref 5–15)
BUN: 28 mg/dL — ABNORMAL HIGH (ref 8–23)
CO2: 24 mmol/L (ref 22–32)
Calcium: 9 mg/dL (ref 8.9–10.3)
Chloride: 105 mmol/L (ref 98–111)
Creatinine, Ser: 2.81 mg/dL — ABNORMAL HIGH (ref 0.44–1.00)
GFR calc Af Amer: 20 mL/min — ABNORMAL LOW (ref >60)
GFR calc non Af Amer: 17 mL/min — ABNORMAL LOW (ref >60)
Glucose, Bld: 93 mg/dL (ref 70–99)
Potassium: 3.9 mmol/L (ref 3.5–5.1)
Sodium: 139 mmol/L (ref 135–145)
Total Bilirubin: 0.8 mg/dL (ref 0.3–1.2)
Total Protein: 8.1 g/dL (ref 6.5–8.1)

## 2018-10-04 LAB — LIPASE, BLOOD: Lipase: 27 U/L (ref 11–51)

## 2018-10-04 LAB — SARS CORONAVIRUS 2 BY RT PCR (HOSPITAL ORDER, PERFORMED IN ~~LOC~~ HOSPITAL LAB): SARS Coronavirus 2: NEGATIVE

## 2018-10-04 MED ORDER — SENNOSIDES-DOCUSATE SODIUM 8.6-50 MG PO TABS: 1.0000 | ORAL_TABLET | Freq: Every evening | ORAL | Status: DC | PRN

## 2018-10-04 MED ORDER — PANTOPRAZOLE SODIUM 40 MG PO TBEC
40.0000 mg | DELAYED_RELEASE_TABLET | Freq: Every day | ORAL | Status: DC
  Administered 2018-10-05 – 2018-10-08 (×4): 40 mg via ORAL
  Filled 2018-10-04 (×4): qty 1

## 2018-10-04 MED ORDER — MOMETASONE FURO-FORMOTEROL FUM 200-5 MCG/ACT IN AERO
2.0000 | INHALATION_SPRAY | Freq: Two times a day (BID) | RESPIRATORY_TRACT | Status: DC
  Administered 2018-10-05 – 2018-10-08 (×7): 2 via RESPIRATORY_TRACT
  Filled 2018-10-04: qty 8.8

## 2018-10-04 MED ORDER — SODIUM CHLORIDE 0.9 % IV SOLN
INTRAVENOUS | Status: DC
  Administered 2018-10-04 – 2018-10-08 (×7): via INTRAVENOUS

## 2018-10-04 MED ORDER — HEPARIN SODIUM (PORCINE) 5000 UNIT/ML IJ SOLN
5000.0000 [IU] | Freq: Three times a day (TID) | INTRAMUSCULAR | Status: DC
  Administered 2018-10-05 – 2018-10-08 (×10): 5000 [IU] via SUBCUTANEOUS
  Filled 2018-10-04 (×10): qty 1

## 2018-10-04 MED ORDER — IPRATROPIUM-ALBUTEROL 0.5-2.5 (3) MG/3ML IN SOLN
3.0000 mL | Freq: Four times a day (QID) | RESPIRATORY_TRACT | Status: DC
  Administered 2018-10-04: 3 mL via RESPIRATORY_TRACT
  Filled 2018-10-04: qty 3

## 2018-10-04 MED ORDER — SODIUM CHLORIDE 0.9 % IV BOLUS
1000.0000 mL | Freq: Once | INTRAVENOUS | Status: AC
  Administered 2018-10-04: 1000 mL via INTRAVENOUS

## 2018-10-04 MED ORDER — ACETAMINOPHEN 650 MG RE SUPP: 650.0000 mg | Freq: Four times a day (QID) | RECTAL | Status: DC | PRN

## 2018-10-04 MED ORDER — FOLIC ACID 1 MG PO TABS
1.0000 mg | ORAL_TABLET | Freq: Every evening | ORAL | Status: DC
  Administered 2018-10-04 – 2018-10-07 (×4): 1 mg via ORAL
  Filled 2018-10-04 (×5): qty 1

## 2018-10-04 MED ORDER — AEROCHAMBER PLUS FLO-VU MEDIUM MISC
1.0000 | Freq: Once | Status: AC
  Administered 2018-10-04: 1
  Filled 2018-10-04: qty 1

## 2018-10-04 MED ORDER — ALBUTEROL SULFATE HFA 108 (90 BASE) MCG/ACT IN AERS
8.0000 | INHALATION_SPRAY | Freq: Once | RESPIRATORY_TRACT | Status: AC
  Administered 2018-10-04: 8 via RESPIRATORY_TRACT
  Filled 2018-10-04: qty 6.7

## 2018-10-04 MED ORDER — SODIUM CHLORIDE 0.9% FLUSH
10.0000 mL | Freq: Once | INTRAVENOUS | Status: AC
  Administered 2018-10-04: 10 mL
  Filled 2018-10-04: qty 10

## 2018-10-04 MED ORDER — ACETAMINOPHEN 325 MG PO TABS: 650.0000 mg | ORAL_TABLET | Freq: Four times a day (QID) | ORAL | Status: DC | PRN

## 2018-10-04 MED ORDER — ALBUTEROL SULFATE HFA 108 (90 BASE) MCG/ACT IN AERS
1.0000 | INHALATION_SPRAY | Freq: Four times a day (QID) | RESPIRATORY_TRACT | Status: DC | PRN
  Filled 2018-10-04: qty 6.7

## 2018-10-04 MED ORDER — ALBUTEROL SULFATE (2.5 MG/3ML) 0.083% IN NEBU: 2.5000 mg | INHALATION_SOLUTION | RESPIRATORY_TRACT | Status: DC | PRN

## 2018-10-04 MED ORDER — METOPROLOL TARTRATE 12.5 MG HALF TABLET
12.5000 mg | ORAL_TABLET | Freq: Two times a day (BID) | ORAL | Status: DC
  Administered 2018-10-04 – 2018-10-08 (×8): 12.5 mg via ORAL
  Filled 2018-10-04 (×8): qty 1

## 2018-10-04 MED ORDER — MONTELUKAST SODIUM 10 MG PO TABS
10.0000 mg | ORAL_TABLET | Freq: Every day | ORAL | Status: DC
  Administered 2018-10-05 – 2018-10-07 (×3): 10 mg via ORAL
  Filled 2018-10-04 (×3): qty 1

## 2018-10-04 MED ORDER — HYDROCODONE-ACETAMINOPHEN 7.5-325 MG PO TABS
1.0000 | ORAL_TABLET | Freq: Four times a day (QID) | ORAL | Status: DC | PRN
  Administered 2018-10-05 – 2018-10-08 (×7): 1 via ORAL
  Filled 2018-10-04 (×7): qty 1

## 2018-10-04 MED ORDER — IPRATROPIUM-ALBUTEROL 0.5-2.5 (3) MG/3ML IN SOLN
3.0000 mL | Freq: Three times a day (TID) | RESPIRATORY_TRACT | Status: DC
  Administered 2018-10-05 – 2018-10-07 (×6): 3 mL via RESPIRATORY_TRACT
  Filled 2018-10-04 (×7): qty 3

## 2018-10-04 MED ORDER — BUPROPION HCL ER (SR) 150 MG PO TB12
150.0000 mg | ORAL_TABLET | Freq: Every day | ORAL | Status: DC
  Administered 2018-10-05 – 2018-10-08 (×4): 150 mg via ORAL
  Filled 2018-10-04 (×4): qty 1

## 2018-10-04 MED ORDER — ASPIRIN 81 MG PO CHEW
81.0000 mg | CHEWABLE_TABLET | Freq: Every day | ORAL | Status: DC
  Administered 2018-10-05 – 2018-10-08 (×4): 81 mg via ORAL
  Filled 2018-10-04 (×4): qty 1

## 2018-10-04 MED ORDER — SODIUM CHLORIDE 0.9 % IV SOLN
1.0000 g | Freq: Once | INTRAVENOUS | Status: AC
  Administered 2018-10-04: 1 g via INTRAVENOUS
  Filled 2018-10-04: qty 10

## 2018-10-04 MED ORDER — LORAZEPAM 1 MG PO TABS
2.0000 mg | ORAL_TABLET | Freq: Every evening | ORAL | Status: DC
  Administered 2018-10-04 – 2018-10-07 (×4): 2 mg via ORAL
  Filled 2018-10-04 (×5): qty 2

## 2018-10-04 MED ORDER — DULOXETINE HCL 30 MG PO CPEP
120.0000 mg | ORAL_CAPSULE | Freq: Every day | ORAL | Status: DC
  Administered 2018-10-05 – 2018-10-08 (×4): 120 mg via ORAL
  Filled 2018-10-04 (×4): qty 4

## 2018-10-04 MED ORDER — LOPERAMIDE HCL 2 MG PO CAPS
2.0000 mg | ORAL_CAPSULE | Freq: Once | ORAL | Status: AC
  Administered 2018-10-04: 2 mg via ORAL
  Filled 2018-10-04: qty 1

## 2018-10-04 MED ORDER — LEVOTHYROXINE SODIUM 25 MCG PO TABS
25.0000 ug | ORAL_TABLET | Freq: Every day | ORAL | Status: DC
  Administered 2018-10-05 – 2018-10-08 (×4): 25 ug via ORAL
  Filled 2018-10-04 (×4): qty 1

## 2018-10-04 MED ORDER — SODIUM CHLORIDE 0.9 % IV SOLN
1.0000 g | INTRAVENOUS | Status: DC
  Administered 2018-10-05 – 2018-10-07 (×3): 1 g via INTRAVENOUS
  Filled 2018-10-04: qty 1
  Filled 2018-10-04: qty 10
  Filled 2018-10-04 (×2): qty 1

## 2018-10-04 NOTE — ED Triage Notes (Addendum)
Patient sent over from [facility].   Patient states she had abnormal kidney labs and sent over for fluids. Possible Kidney infection.    C/O lower pelvic pain and right lower back pain X 1.5 weeks.  Denies emesis  7/10 throbbing pain in back   Patient unable to get her chemo today.   C/o burning wile voiding.   A/Ox4 Ambulatory in ED.

## 2018-10-04 NOTE — ED Provider Notes (Signed)
[facility] DEPT Provider Note   CSN: [id number] Arrival date & time: 10/04/18  1056    History   Chief Complaint Chief Complaint  Patient presents with   Pelvic Pain   Flank Pain   Cancer Patient    HPI [name] is a 63 y.o. female with history of non-small cell lung cancer, adenocarcinoma, COPD, asthma, CAD, HTN referred to the ER by oncology for evaluation of abnormal creatinine on routine lab work before chemotherapy treatment today.  Per chart review creatinine 2.93, previously normal.  Patient states for the last 1.5 weeks she has had right flank pain that has radiated to the right mid abdomen, right lower quadrant and suprapubic area.  It is constant, moderate.  Has had associated "foamy" urine, dysuria, increased urinary frequency.  She had chills last night but no fever.  Also states she feels constantly thirsty despite drinking a lot of water.  Her last chemo therapy treatment was almost 6 weeks ago.  She denies other infectious symptoms such as body aches, congestion, vomiting, diarrhea.  Reports 3 to 4 weeks ago she had a flare of her asthma and her PCP prescribed her prednisone.  States that her symptoms overall improved but over the last few days she has had worsening wheezing, increased sputum production.  Has chronic shortness of breath at baseline that she attributes to her COPD and lung cancer, not any worse.  Also states that her chest is usually uncomfortable, chronic, attributes it to her lung cancer but this is not any different or exertional, pleuritic.     HPI  Past Medical History:  Diagnosis Date   Anemia    "long time ago" (12/06/2012)   Anxiety    Arthritis    "right knee real bad; in my hands bad" (12/06/2012)   Asthma    Celiac disease    Colon polyps    COPD (chronic obstructive pulmonary disease) (HCC)    Coronary artery disease    a. s/p Xience DES to [facility] 04/2008;  b. LHC 05/2008: Proximal RCA 25%, mid  RCA stent patent.  c. Anormal nuc 2014 -> s/p LHC with severe mRCA stenosis s/p DES. d. Cath 04/2013 s/p DES to LAD.  e. LHC 08/2015 was stable.   Depression    "years ago" (12/06/2012)   Fibromyalgia    Gallstones    GERD (gastroesophageal reflux disease)    H/O hiatal hernia    Hepatitis    "not A, B, or C" (12/06/2012)   Hyperlipidemia    intol to statins and other chol agents due to elevated LFTs   Hypertension    Hypothyroidism    IBS (irritable bowel syndrome)    Interstitial cystitis    Lung cancer ([place]) dx'd 01/2018   Lung nodules    Bilateral   Migraines    "when I was younger" (12/06/2012)   Pneumonia    PONV (postoperative nausea and vomiting)    "Very bad"   Premature atrial contractions    PVC's (premature ventricular contractions)    Sinus headache    SVT (supraventricular tachycardia) ([place])     Patient Active Problem List   Diagnosis Date Noted   Diarrhea 06/20/2018   Tardive dyskinesia 06/20/2018   Medication side effect 06/20/2018   Nausea and vomiting 05/11/2018   AKI (acute kidney injury) ([place]) 05/11/2018   Port-A-Cath in place 05/02/2018   Adenocarcinoma of left lung, stage 4 ([place]) 04/05/2018   Encounter for antineoplastic chemotherapy 04/05/2018  Encounter for antineoplastic immunotherapy 04/05/2018   Goals of care, counseling/discussion 04/05/2018   Tobacco abuse counseling 03/05/2018   PVC's (premature ventricular contractions)    SVT (supraventricular tachycardia) (HCC)    Premature atrial contractions    Unstable angina ([place]) 12/07/2012   Tachycardia 02/19/2009   CAD, NATIVE VESSEL 06/04/2008   Hyperlipemia 05/29/2008   DEPRESSION 05/29/2008   Asthma 05/29/2008   GERD 05/29/2008   Irritable bowel syndrome 05/29/2008   DEGENERATIVE JOINT DISEASE, LUMBAR SPINE 05/29/2008   FIBROMYALGIA 05/29/2008   CHEST PAIN-PRECORDIAL 05/07/2008    Past Surgical History:  Procedure Laterality Date   CARDIAC  CATHETERIZATION  04/2008; 05/2008   CARDIAC CATHETERIZATION N/A 08/07/2015   Procedure: Left Heart Cath and Coronary Angiography;  Surgeon: Sherren Mocha, MD;  Location: [facility];  Service: Cardiovascular;  Laterality: N/A;   CHOLECYSTECTOMY     CORONARY ANGIOPLASTY WITH STENT PLACEMENT  04/2008 12/06/2012   "1 + 1" (12/06/2012)   CORONARY STENT PLACEMENT  05/08/2013   DES TO LAD      DR MCALHANY   IR IMAGING GUIDED PORT INSERTION  04/29/2018   LEFT HEART CATHETERIZATION WITH CORONARY ANGIOGRAM N/A 12/06/2012   Procedure: LEFT HEART CATHETERIZATION WITH CORONARY ANGIOGRAM;  Surgeon: [name], MD;  Location: [facility] CATH LAB;  Service: Cardiovascular;  Laterality: N/A;   LEFT HEART CATHETERIZATION WITH CORONARY ANGIOGRAM N/A 05/08/2013   Procedure: LEFT HEART CATHETERIZATION WITH CORONARY ANGIOGRAM;  Surgeon: [name], MD;  Location: [facility] CATH LAB;  Service: Cardiovascular;  Laterality: N/A;   LEFT HEART CATHETERIZATION WITH CORONARY ANGIOGRAM N/A 01/04/2014   Procedure: LEFT HEART CATHETERIZATION WITH CORONARY ANGIOGRAM;  Surgeon: [name], MD;  Location: [facility] CATH LAB;  Service: Cardiovascular;  Laterality: N/A;   PERCUTANEOUS CORONARY STENT INTERVENTION (PCI-S)  12/06/2012   Procedure: PERCUTANEOUS CORONARY STENT INTERVENTION (PCI-S);  Surgeon: [name], MD;  Location: [facility] CATH LAB;  Service: Cardiovascular;;   PERCUTANEOUS CORONARY STENT INTERVENTION (PCI-S)  05/08/2013   Procedure: PERCUTANEOUS CORONARY STENT INTERVENTION (PCI-S);  Surgeon: [name], MD;  Location: [facility] CATH LAB;  Service: Cardiovascular;;  mid LAD    TUBAL LIGATION     VAGINAL HYSTERECTOMY     VIDEO BRONCHOSCOPY WITH ENDOBRONCHIAL NAVIGATION N/A 02/17/2018   Procedure: VIDEO BRONCHOSCOPY WITH ENDOBRONCHIAL NAVIGATION;  Surgeon: Melrose Nakayama, MD;  Location: [facility];  Service: Thoracic;  Laterality: N/A;     OB History   No obstetric history on  file.      Home Medications    Prior to Admission medications   Medication Sig Start Date End Date Taking? Authorizing Provider  albuterol (PROAIR HFA) 108 (90 Base) MCG/ACT inhaler Inhale 1 puff into the lungs every 6 (six) hours as needed (wheezing/shortness of breath.).     [provider]  albuterol (PROVENTIL) (5 MG/ML) 0.5% nebulizer solution Take 2.5 mg by nebulization every 6 (six) hours as needed for wheezing.    [provider]  amLODipine (NORVASC) 5 MG tablet Take 1.5 tablets (7.5 mg total) by mouth daily. Patient taking differently: Take 7.5 mg by mouth every evening.  01/10/18 05/18/18  [name], MD  aspirin 81 MG chewable tablet Chew 1 tablet (81 mg total) by mouth daily. 12/07/12   Barrett, Evelene Croon, PA-C  BELSOMRA 20 MG TABS Take 20 mg by mouth at bedtime as needed for sleep. 04/29/18   [provider]  buPROPion (WELLBUTRIN SR) 150 MG 12 hr tablet Take 150-300 mg by mouth See  admin instructions. Take 2 tablets (300 mg) by mouth daily in the morning & take 1 tablet (150 mg) by mouth in the evening.    [provider]  DULoxetine (CYMBALTA) 60 MG capsule Take 120 mg by mouth daily. 04/28/18   [provider]  ergocalciferol (VITAMIN D2) 50000 UNITS capsule Take 50,000 Units by mouth every Wednesday.     [provider]  folic acid (FOLVITE) 1 MG tablet Take 1 tablet (1 mg total) by mouth daily. 04/05/18   Curt Bears, MD  HYDROcodone-acetaminophen [facility]) 7.5-325 MG per tablet Take 1 tablet by mouth every 6 (six) hours as needed for pain.    [provider]  levothyroxine (SYNTHROID, LEVOTHROID) 25 MCG tablet Take 25 mcg by mouth daily before breakfast.    [provider]  lidocaine-prilocaine (EMLA) cream Apply 1 application topically as needed. 04/19/18   Heilingoetter, Cassandra L, PA-C  loperamide (IMODIUM) 2 MG capsule Take 1 capsule (2 mg total) by mouth as needed for diarrhea or loose  stools (Initiate if stool C diff -ve). Patient not taking: Reported on 10/04/2018 05/14/18   Hosie Poisson, MD  LORazepam (ATIVAN) 2 MG tablet Take 2 mg by mouth See admin instructions. Take 1 tablet (2 mg) by mouth scheduled at bedtime & take 1 tablet (2 mg) by mouth twice daily if needed for anxiety    [provider]  metoprolol tartrate (LOPRESSOR) 25 MG tablet Take 25 mg by mouth 2 (two) times daily.    [provider]  montelukast (SINGULAIR) 10 MG tablet Take 10 mg by mouth at bedtime.     [provider]  nitroGLYCERIN (NITROSTAT) 0.4 MG SL tablet Place 1 tablet (0.4 mg total) under the tongue every 5 (five) minutes as needed for chest pain. Patient not taking: Reported on 10/04/2018 12/07/12   Barrett, Evelene Croon, PA-C  omeprazole (PRILOSEC) 40 MG capsule Take 1 tablet by mouth twice a day for 2 weeks then 1 tablet every morning thereafter 05/18/18   Esterwood, Amy S, PA-C  ondansetron (ZOFRAN ODT) 4 MG disintegrating tablet Take 1 tablet (4 mg total) by mouth every 8 (eight) hours as needed for nausea or vomiting. Patient not taking: Reported on 10/04/2018 05/23/18   Heilingoetter, Cassandra L, PA-C  ondansetron (ZOFRAN) 8 MG tablet Take 1 tablet (8 mg total) by mouth every 8 (eight) hours as needed for nausea or vomiting. 06/20/18   Heilingoetter, Cassandra L, PA-C  prochlorperazine (COMPAZINE) 10 MG tablet TAKE 1 TABLET(10 MG) BY MOUTH EVERY 6 HOURS AS NEEDED FOR NAUSEA OR VOMITING 06/09/18   Curt Bears, MD  [facility] 160-4.5 MCG/ACT inhaler Take 2 puffs by mouth 2 (two) times daily. 12/08/17   [provider]  temazepam (RESTORIL) 15 MG capsule Take 15 mg by mouth at bedtime.     [provider]  vitamin B-12 100 MCG tablet Take 1 tablet (100 mcg total) by mouth daily. 05/15/18   Hosie Poisson, MD    Family History Family History  Problem Relation Age of Onset   Colon cancer Father 72   Lung disease Father    Emphysema Mother    Drug abuse  Brother    Hepatitis C Brother    Drug abuse Brother    Celiac disease Son    Celiac disease Grandson    Down syndrome Grandson    Diabetes Granddaughter    CAD Neg Hx     Social History Social History   Tobacco Use   Smoking status:  Former Smoker    Packs/day: 0.50    Years: 30.00    Pack years: 15.00    Types: Cigarettes    Quit date: 2009    Years since quitting: 11.5   Smokeless tobacco: Never Used   Tobacco comment: 12/06/2012 "quit smoking cigarettes ~ 8 yr ago"  Substance Use Topics   Alcohol use: No   Drug use: No     Allergies   Ciprofloxacin, Levofloxacin, Statins, Tricor [fenofibrate], Latex, Codeine, and Metoprolol   Review of Systems Review of Systems  Constitutional: Positive for chills.  Respiratory: Positive for cough and wheezing.   Gastrointestinal: Positive for abdominal pain and nausea.  Genitourinary: Positive for difficulty urinating, dysuria and flank pain.  Allergic/Immunologic: Positive for immunocompromised state.  All other systems reviewed and are negative.    Physical Exam Updated Vital Signs BP (!) 114/91    Pulse 70    Temp 98.6 F (37 C) (Oral)    Resp 17    SpO2 100%   Physical Exam Vitals signs and nursing note reviewed.  Constitutional:      Appearance: She is well-developed.     Comments: Non toxic in NAD  HENT:     Head: Normocephalic and atraumatic.     Nose: Nose normal.     Mouth/Throat:     Mouth: Mucous membranes are dry.     Comments: Dry lips but moist mucous membranes Eyes:     Conjunctiva/sclera: Conjunctivae normal.  Neck:     Musculoskeletal: Normal range of motion.  Cardiovascular:     Rate and Rhythm: Normal rate and regular rhythm.  Pulmonary:     Effort: Pulmonary effort is normal.     Breath sounds: Wheezing present.     Comments: Diffuse and expiratory wheezing in upper/middle lobes bilaterally left slightly more prominent than right.  Normal work of breathing. Abdominal:      General: Bowel sounds are normal.     Palpations: Abdomen is soft.     Tenderness: There is abdominal tenderness in the suprapubic area. There is right CVA tenderness.     Comments: No G/R/R. Negative Murphy's and McBurney's. Active BS to lower quadrants.   Musculoskeletal: Normal range of motion.  Skin:    General: Skin is warm and dry.     Capillary Refill: Capillary refill takes less than 2 seconds.  Neurological:     Mental Status: She is alert.  Psychiatric:        Behavior: Behavior normal.      ED Treatments / Results  Labs (all labs ordered are listed, but only abnormal results are displayed) Labs Reviewed  CBC WITH DIFFERENTIAL/PLATELET - Abnormal; Notable for the following components:      Result Value   WBC 11.1 (*)    RBC 3.42 (*)    Hemoglobin 11.5 (*)    HCT 34.1 (*)    Neutro Abs 8.1 (*)    All other components within normal limits  COMPREHENSIVE METABOLIC PANEL - Abnormal; Notable for the following components:   BUN 28 (*)    Creatinine, Ser 2.81 (*)    GFR calc non Af Amer 17 (*)    GFR calc Af Amer 20 (*)    All other components within normal limits  URINALYSIS, ROUTINE W REFLEX MICROSCOPIC - Abnormal; Notable for the following components:   APPearance HAZY (*)    Hgb urine dipstick SMALL (*)    Protein, ur 100 (*)    Leukocytes,Ua MODERATE (*)  WBC, UA >50 (*)    Bacteria, UA MANY (*)    Non Squamous Epithelial 0-5 (*)    All other components within normal limits  URINE CULTURE  SARS CORONAVIRUS 2 (HOSPITAL ORDER, [facility])  LIPASE, BLOOD    EKG None  Radiology Dg Chest 2 View  Result Date: 10/04/2018 CLINICAL DATA:  Cough and wheezing EXAM: CHEST - 2 VIEW COMPARISON:  February 17, 2018 FINDINGS: Port-A-Cath tip is in the superior vena cava. No pneumothorax. No edema or consolidation. Heart size and pulmonary vascularity are normal. No adenopathy. No bone lesions. A stent is noted in the right coronary artery.  IMPRESSION: No edema or consolidation. No adenopathy. Port-A-Cath tip in superior vena cava. Electronically Signed   By: Lowella Grip III M.D.   On: 10/04/2018 14:04   Ct Renal Stone Study  Result Date: 10/04/2018 CLINICAL DATA:  Right low back and pelvic pain for 1.5 weeks. Abnormal urinalysis and elevated white blood cell count. Burning with urination. History of lung cancer. EXAM: CT ABDOMEN AND PELVIS WITHOUT CONTRAST TECHNIQUE: Multidetector CT imaging of the abdomen and pelvis was performed following the standard protocol without IV contrast. COMPARISON:  CT abdomen and pelvis 05/11/2018. FINDINGS: Lower chest: Lung bases are clear. No pleural or pericardial effusion. Hepatobiliary: No focal liver abnormality is seen. No gallstones, gallbladder wall thickening, or biliary dilatation. Pancreas: Unremarkable. No pancreatic ductal dilatation or surrounding inflammatory changes. Spleen: Normal in size without focal abnormality. Adrenals/Urinary Tract: Adrenal glands are unremarkable. Kidneys are normal, without renal calculi, focal lesion, or hydronephrosis. Bladder is unremarkable. Stomach/Bowel: Stomach is within normal limits. Appendix appears normal. No evidence of bowel wall thickening, distention, or inflammatory changes. Vascular/Lymphatic: Aortic atherosclerosis. No enlarged abdominal or pelvic lymph nodes. Reproductive: Status post hysterectomy. No adnexal masses. Other: None. Musculoskeletal: No focal lesion. No fracture. Degenerative disc disease lower lumbar spine noted. IMPRESSION: Negative for urinary tract stone. No acute abnormality abdomen or pelvis. Atherosclerosis. Electronically Signed   By: Inge Rise M.D.   On: 10/04/2018 14:00    Procedures .Critical Care Performed by: Kinnie Feil, PA-C Authorized by: Kinnie Feil, PA-C   Critical care provider statement:    Critical care time (minutes):  45   Critical care was necessary to treat or prevent imminent or  life-threatening deterioration of the following conditions:  Renal failure   Critical care was time spent personally by me on the following activities:  Discussions with consultants, evaluation of patient's response to treatment, examination of patient, ordering and performing treatments and interventions, ordering and review of laboratory studies, ordering and review of radiographic studies, pulse oximetry, re-evaluation of patient's condition, obtaining history from patient or surrogate, review of old charts and development of treatment plan with patient or surrogate   I assumed direction of critical care for this patient from another provider in my specialty: no     (including critical care time)  Medications Ordered in ED Medications  cefTRIAXone (ROCEPHIN) 1 g in sodium chloride 0.9 % 100 mL IVPB (1 g Intravenous New Bag/Given 10/04/18 1419)  sodium chloride 0.9 % bolus 1,000 mL (0 mLs Intravenous Stopped 10/04/18 1327)  albuterol (VENTOLIN HFA) 108 (90 Base) MCG/ACT inhaler 8 puff (8 puffs Inhalation Given 10/04/18 1211)  AeroChamber Plus Flo-Vu Medium MISC 1 each (1 each Other Given 10/04/18 1212)     Initial Impression / Assessment and Plan / ED Course  I have reviewed the triage vital signs and the nursing notes.  Pertinent labs & imaging results that were available during my care of the patient were reviewed by me and considered in my medical decision making (see chart for details).  Clinical Course as of Oct 03 1433  Tue Oct 04, 2018  1345 WBC(!): 11.1 [CG]  1345 Chalmers Guest): MODERATE [CG]  1345 WBC, UA(!): >50 [CG]  1345 Bacteria, UA(!): MANY [CG]  1431 Creatinine(!): 2.81 [CG]    Clinical Course User Index [CG] Kinnie Feil, PA-C   ddx includes pyelonephritis/UTI vs renal stone.  Given chronicity of pain 1.5 weeks, exam I have lower suspicion for appendicitis, right sided diverticulitis.  Doubt pelvic etiology.   ER work up reviewed by me remarkable as above. AKI  creatinine 2.81. UA with infection. CT renal without abnormalities. No SIRS criteria.   Pt given IVF, rocephin for AKI and UTI.   Of note pt noted to have expiratory wheezing, not initially forthcoming about respiratory symptoms but states she has had increased cough, wheezing.  Albuterol given here and re-check shows improvement in wheezing.  CXR unremarkable. She is low risk for COVID. No exposure, travel, has been at home during pandemic however husband goes to work.   Discussed with hospitalist who will admit patient for AKI, pyelonephritis, likely asthma/COPD exacerbation.   Final Clinical Impressions(s) / ED Diagnoses   Final diagnoses:  Pyelonephritis  Acute kidney injury [facility])  Expiratory wheezing    ED Discharge Orders    None       Arlean Hopping 10/04/18 1435    Blanchie Dessert, MD 10/05/18 2339

## 2018-10-04 NOTE — Progress Notes (Signed)
[place] OFFICE PROGRESS NOTE  [name], NP [place] [address]  DIAGNOSIS: Stage IV (T1a, N0, M1 a) non-small cell lung cancer, adenocarcinoma presented with multifocal disease in both lungs including 3 nodules in the left lung as well as 2 nodules in the right lung diagnosed in December 2019. No actionable mutations KRAS G12C BCOR N1425S-subclonal RAD21 amplification  PDL 1 expression 1%.  PRIOR THERAPY: None  CURRENT THERAPY: Systemic chemotherapy with carboplatin for AUC of 5, Alimta 500 mg/M2 and Keytruda 200 mg IV every 3 weeks. First dose April 12, 2018.Status post7cycles.Starting from cycle #3, Beryle Flock was dropped and patient received treatment with Carboplatin for an AUC of 5 and Alimta 500 mg/m2 IV every 3 weeks.Starting from cycle #5, the patient will be on maintenance Alimta 500 mg/m2.   INTERVAL HISTORY: [name] 63 y.o. female returns to the clinic for a follow up visit. The patient missed her last cycle of treatment with Alimta due to having conjunctivitis which has since resolved. She has not had treatment with chemotherapy in 6 weeks. She cancelled her rescheduled appointments. The patient is feeling fair today except she is endorsing "foamy urine" for approximately 1 week and right flank/lower lumbar pain. The pain is fairly constant and she describes her pain as a "throbbing pain" that is non-radiating. She denies any traumas or injuries to the area. She denies any known factors that exacerbate her pain. She has not tried taking any pain medications to alleviate her pain. She denies ibuprofen use or tylenol. She denies any associated fevers, painful urination, lower extremity edema, or hematuria. She did mention that she has occasional swelling around her cheeks/mouth and fingers occasionally. When asked if she has had chills she states that she had two episodes of "feeling cold on the inside". She also notes that  she has increased frequency of urination but "not a lot" comes out. She denies any medication changes recently. She states she has been trying to stay hydrated recently and has been drinking gatorade but feels like her mouth is always dry. She also had one episode of diarrhea last week in which she had 8-9 loose stools that lasted one day despite taking imodium. She has not had any significant diarrhea since that time.   Otherwise, she denies any weight loss or night sweats. She frequently has shortness of breath with exertion. She was given a prescription for steroids ~3 weeks ago by her PCP for her shortness of breath which normally resolves her symptoms. She reports frequent nausea but denies vomiting. He nausea is manages with zofran. She denies any headaches or visual changes. She is here for evaluation before starting cycle #8.   MEDICAL HISTORY: Past Medical History:  Diagnosis Date  . Anemia    "long time ago" (12/06/2012)  . Anxiety   . Arthritis    "right knee real bad; in my hands bad" (12/06/2012)  . Asthma   . Celiac disease   . Colon polyps   . COPD (chronic obstructive pulmonary disease) ([place])   . Coronary artery disease    a. s/p Xience DES to [facility] 04/2008;  b. LHC 05/2008: Proximal RCA 25%, mid RCA stent patent.  c. Anormal nuc 2014 -> s/p LHC with severe mRCA stenosis s/p DES. d. Cath 04/2013 s/p DES to LAD.  e. LHC 08/2015 was stable.  . Depression    "years ago" (12/06/2012)  . Fibromyalgia   . Gallstones   . GERD (gastroesophageal reflux  disease)   . H/O hiatal hernia   . Hepatitis    "not A, B, or C" (12/06/2012)  . Hyperlipidemia    intol to statins and other chol agents due to elevated LFTs  . Hypertension   . Hypothyroidism   . IBS (irritable bowel syndrome)   . Interstitial cystitis   . Lung cancer ([place]) dx'd 01/2018  . Lung nodules    Bilateral  . Migraines    "when I was younger" (12/06/2012)  . Pneumonia   . PONV (postoperative nausea and vomiting)    "Very  bad"  . Premature atrial contractions   . PVC's (premature ventricular contractions)   . Sinus headache   . SVT (supraventricular tachycardia) ([facility])     ALLERGIES:  is allergic to ciprofloxacin; levofloxacin; statins; tricor [fenofibrate]; latex; codeine; and metoprolol.  MEDICATIONS:  No current facility-administered medications for this visit.    Current Outpatient Medications  Medication Sig Dispense Refill  . albuterol (PROAIR HFA) 108 (90 Base) MCG/ACT inhaler Inhale 1 puff into the lungs every 6 (six) hours as needed (wheezing/shortness of breath.).     Marland Kitchen albuterol (PROVENTIL) (5 MG/ML) 0.5% nebulizer solution Take 2.5 mg by nebulization every 6 (six) hours as needed for wheezing.    Marland Kitchen aspirin 81 MG chewable tablet Chew 1 tablet (81 mg total) by mouth daily.    . BELSOMRA 20 MG TABS Take 20 mg by mouth at bedtime as needed for sleep.    Marland Kitchen buPROPion (WELLBUTRIN SR) 150 MG 12 hr tablet Take 150-300 mg by mouth See admin instructions. Take 2 tablets (300 mg) by mouth daily in the morning & take 1 tablet (150 mg) by mouth in the evening.    . DULoxetine (CYMBALTA) 60 MG capsule Take 120 mg by mouth daily.    . ergocalciferol (VITAMIN D2) 50000 UNITS capsule Take 50,000 Units by mouth every Wednesday.     . folic acid (FOLVITE) 1 MG tablet Take 1 tablet (1 mg total) by mouth daily. 30 tablet 4  . HYDROcodone-acetaminophen (NORCO) 7.5-325 MG per tablet Take 1 tablet by mouth every 6 (six) hours as needed for pain.    Marland Kitchen levothyroxine (SYNTHROID, LEVOTHROID) 25 MCG tablet Take 25 mcg by mouth daily before breakfast.    . lidocaine-prilocaine (EMLA) cream Apply 1 application topically as needed. 30 g 0  . LORazepam (ATIVAN) 2 MG tablet Take 2 mg by mouth See admin instructions. Take 1 tablet (2 mg) by mouth scheduled at bedtime & take 1 tablet (2 mg) by mouth twice daily if needed for anxiety    . metoprolol tartrate (LOPRESSOR) 25 MG tablet Take 25 mg by mouth 2 (two) times daily.    .  montelukast (SINGULAIR) 10 MG tablet Take 10 mg by mouth at bedtime.     Marland Kitchen omeprazole (PRILOSEC) 40 MG capsule Take 1 tablet by mouth twice a day for 2 weeks then 1 tablet every morning thereafter 45 capsule 8  . ondansetron (ZOFRAN) 8 MG tablet Take 1 tablet (8 mg total) by mouth every 8 (eight) hours as needed for nausea or vomiting. 30 tablet 0  . prochlorperazine (COMPAZINE) 10 MG tablet TAKE 1 TABLET(10 MG) BY MOUTH EVERY 6 HOURS AS NEEDED FOR NAUSEA OR VOMITING 30 tablet 0  . SYMBICORT 160-4.5 MCG/ACT inhaler Take 2 puffs by mouth 2 (two) times daily.  3  . temazepam (RESTORIL) 15 MG capsule Take 15 mg by mouth at bedtime.     . vitamin B-12 100 MCG  tablet Take 1 tablet (100 mcg total) by mouth daily. 30 tablet 1  . amLODipine (NORVASC) 5 MG tablet Take 1.5 tablets (7.5 mg total) by mouth daily. (Patient taking differently: Take 7.5 mg by mouth every evening. ) 180 tablet 3  . loperamide (IMODIUM) 2 MG capsule Take 1 capsule (2 mg total) by mouth as needed for diarrhea or loose stools (Initiate if stool C diff -ve). (Patient not taking: Reported on 10/04/2018) 30 capsule 0  . nitroGLYCERIN (NITROSTAT) 0.4 MG SL tablet Place 1 tablet (0.4 mg total) under the tongue every 5 (five) minutes as needed for chest pain. (Patient not taking: Reported on 10/04/2018) 25 tablet 12  . ondansetron (ZOFRAN ODT) 4 MG disintegrating tablet Take 1 tablet (4 mg total) by mouth every 8 (eight) hours as needed for nausea or vomiting. (Patient not taking: Reported on 10/04/2018) 20 tablet 0   Facility-Administered Medications Ordered in Other Visits  Medication Dose Route Frequency Provider Last Rate Last Dose  . cefTRIAXone (ROCEPHIN) 1 g in sodium chloride 0.9 % 100 mL IVPB  1 g Intravenous Once Kinnie Feil, PA-C        SURGICAL HISTORY:  Past Surgical History:  Procedure Laterality Date  . CARDIAC CATHETERIZATION  04/2008; 05/2008  . CARDIAC CATHETERIZATION N/A 08/07/2015   Procedure: Left Heart Cath and  Coronary Angiography;  Surgeon: Sherren Mocha, MD;  Location: [facility];  Service: Cardiovascular;  Laterality: N/A;  . CHOLECYSTECTOMY    . CORONARY ANGIOPLASTY WITH STENT PLACEMENT  04/2008 12/06/2012   "1 + 1" (12/06/2012)  . CORONARY STENT PLACEMENT  05/08/2013   DES TO LAD      DR MCALHANY  . IR IMAGING GUIDED PORT INSERTION  04/29/2018  . LEFT HEART CATHETERIZATION WITH CORONARY ANGIOGRAM N/A 12/06/2012   Procedure: LEFT HEART CATHETERIZATION WITH CORONARY ANGIOGRAM;  Surgeon: [name], MD;  Location: [facility] CATH LAB;  Service: Cardiovascular;  Laterality: N/A;  . LEFT HEART CATHETERIZATION WITH CORONARY ANGIOGRAM N/A 05/08/2013   Procedure: LEFT HEART CATHETERIZATION WITH CORONARY ANGIOGRAM;  Surgeon: [name], MD;  Location: [facility] CATH LAB;  Service: Cardiovascular;  Laterality: N/A;  . LEFT HEART CATHETERIZATION WITH CORONARY ANGIOGRAM N/A 01/04/2014   Procedure: LEFT HEART CATHETERIZATION WITH CORONARY ANGIOGRAM;  Surgeon: [name], MD;  Location[facility] CATH LAB;  Service: Cardiovascular;  Laterality: N/A;  . PERCUTANEOUS CORONARY STENT INTERVENTION (PCI-S)  12/06/2012   Procedure: PERCUTANEOUS CORONARY STENT INTERVENTION (PCI-S);  Surgeon: [name], MD;  Location: [facility] CATH LAB;  Service: Cardiovascular;;  . PERCUTANEOUS CORONARY STENT INTERVENTION (PCI-S)  05/08/2013   Procedure: PERCUTANEOUS CORONARY STENT INTERVENTION (PCI-S);  Surgeon: [name], MD;  Location: [facility] CATH LAB;  Service: Cardiovascular;;  mid LAD   . TUBAL LIGATION    . VAGINAL HYSTERECTOMY    . VIDEO BRONCHOSCOPY WITH ENDOBRONCHIAL NAVIGATION N/A 02/17/2018   Procedure: VIDEO BRONCHOSCOPY WITH ENDOBRONCHIAL NAVIGATION;  Surgeon: Melrose Nakayama, MD;  Location: [facility];  Service: Thoracic;  Laterality: N/A;    REVIEW OF SYSTEMS:   Review of Systems  Constitutional: Positive for fatigue, weakness, and occasional chills. Negative for appetite change, fever  and unexpected weight change.  HENT: Negative for mouth sores, nosebleeds, sore throat and trouble swallowing.   Eyes: Negative for eye problems and icterus.  Respiratory: Positive for baseline cough and dyspnea on exertion. Negative for hemoptysis and wheezing.   Cardiovascular: Negative for chest pain and leg swelling.  Gastrointestinal: Positive for frequent chronic diarrhea  and frequent nausea. Negative for abdominal pain, constipation, and vomiting.  Genitourinary: Positive for "foamy urine", increased frequency and decreased urinary stream. Negative for bladder incontinence, dysuria, and hematuria.   Musculoskeletal: Positive for right flank pain/lower lumbar pain. Negative for gait problem, neck pain and neck stiffness.  Skin: Negative for itching and rash.  Neurological: Negative for dizziness, extremity weakness, gait problem, headaches, light-headedness and seizures.  Hematological: Negative for adenopathy. Does not bruise/bleed easily.  Psychiatric/Behavioral: Negative for confusion, depression and sleep disturbance. The patient is not nervous/anxious.     PHYSICAL EXAMINATION:  Blood pressure (!) 143/82, pulse 92, temperature 98.2 F (36.8 C), temperature source Oral, resp. rate 18, height 5' 6"  (1.676 m), weight 147 lb 9.6 oz (67 kg), SpO2 98 %.  ECOG PERFORMANCE STATUS: 1 - Symptomatic but completely ambulatory  Physical Exam  Constitutional: Oriented to person, place, and time and well-developed, well-nourished, and in no distress.  HENT:  Head: Normocephalic and atraumatic.  Mouth/Throat: Oropharynx is clear and moist. No oropharyngeal exudate.  Eyes: Conjunctivae are normal. Right eye exhibits no discharge. Left eye exhibits no discharge. No scleral icterus.  Neck: Normal range of motion. Neck supple.  Cardiovascular: Normal rate, regular rhythm, normal heart sounds and intact distal pulses.   Pulmonary/Chest: Diminished breath sounds in all lung fields. No respiratory  distress. No wheezes. No rales.  Abdominal: Soft. Bowel sounds are normal. Exhibits no distension and no mass. There is no tenderness.  Musculoskeletal: Mild right flank pain tenderness to palpation. Normal range of motion. Exhibits no edema.  Lymphadenopathy:    No cervical adenopathy.  Neurological: Alert and oriented to person, place, and time. Exhibits normal muscle tone. Gait normal. Coordination normal.  Skin: Skin is warm and dry. No rash noted. Not diaphoretic. No erythema. No pallor.  Psychiatric: Mood, memory and judgment normal.  Vitals reviewed.  LABORATORY DATA: Lab Results  Component Value Date   WBC 11.1 (H) 10/04/2018   HGB 11.5 (L) 10/04/2018   HCT 34.1 (L) 10/04/2018   MCV 99.7 10/04/2018   PLT 201 10/04/2018      Chemistry      Component Value Date/Time   NA 139 10/04/2018 1126   NA 143 01/10/2018 1629   K 3.9 10/04/2018 1126   CL 105 10/04/2018 1126   CO2 24 10/04/2018 1126   BUN 28 (H) 10/04/2018 1126   BUN 14 01/10/2018 1629   CREATININE 2.81 (H) 10/04/2018 1126   CREATININE 2.93 (H) 10/04/2018 0902   CREATININE 0.68 08/05/2015 1456      Component Value Date/Time   CALCIUM 9.0 10/04/2018 1126   ALKPHOS 108 10/04/2018 1126   AST 25 10/04/2018 1126   AST 24 10/04/2018 0902   ALT 26 10/04/2018 1126   ALT 26 10/04/2018 0902   BILITOT 0.8 10/04/2018 1126   BILITOT 0.4 10/04/2018 0902       RADIOGRAPHIC STUDIES:  No results found.   ASSESSMENT/PLAN:  This is a very pleasant 63 year old Caucasian female who was diagnosed with stage IV non-small cell lung cancer, adenocarcinoma. She has no actionable mutations and her PDL 1 expression is 1%. She was diagnosed in December 2019. She presented with bilateral pulmonary nodules.  She is currently undergoing treatment with carboplatin for an AUC 5, Alimta 500 mg/m, and Keytruda 200 mg IV every 3 weeks. Beryle Flock was dropped after cycle #2 due to intolerability. She is status post 7 cycles. Starting  from cycle #5,the patient will be undergoing treatment with maintenance single agent Alimta 500  mg/m.   The patient missed her last cycle of treatment due to conjunctivitis and cancelled her res-scheduled appointments. She has not had treatment in 6 weeks with chemotherapy.   The patient was seen wit Dr. Julien Nordmann today. Labs were reviewed. The patient's creatinine is elevated at 2.93 today. She has an acute kidney injury. The patient is agreeable to go to the emergency room for further evaluation and management of her AKI.   We will schedule a follow up appointment in 1 week for evaluation and repeat labs before starting cycle #8.   She will continue taking imodium and zofran as needed for diarrhea and nausea. She also was encouraged to increase her fluid intake.   She also was advised to refrain from using medications that are excreted by the kidneys such as ibuprofen.   The patient was advised to call immediately if she has any concerning symptoms in the interval. The patient voices understanding of current disease status and treatment options and is in agreement with the current care plan. All questions were answered. The patient knows to call the clinic with any problems, questions or concerns. We can certainly see the patient much sooner if necessary   No orders of the defined types were placed in this encounter.    Gunnar Hereford L Mckena Chern, PA-C 10/04/18  ADDENDUM: Hematology/Oncology Attending: I had a face-to-face encounter with the patient today.  I recommended her care plan.  This is a very pleasant 63 years old white female with a stage IV non-small cell lung cancer, adenocarcinoma with no actionable mutation and PDL 1 expression of 1%.  The patient started treatment with systemic chemotherapy with carboplatin, Alimta and Keytruda but Beryle Flock was discontinued after cycle #2 secondary to toxicity. She is status post a total of 7 cycles of her treatment.  She has been off  treatment for the last few weeks because of conjunctivitis and fatigue.  She was supposed to resume her treatment today but the patient presented with significantly elevated serum creatinine concerning for acute kidney injury. I recommended for the patient to go immediately to the emergency department for evaluation and probably admission if needed for management of her acute renal failure.  The patient may need evaluation with urinalysis as well as renal ultrasound and consultation with nephrology. We will arrange for her to come back for follow-up visit in 1 week for reevaluation before resuming her treatment. She was advised to call immediately if she has any other concerning symptoms in the interval.  Disclaimer: This note was dictated with voice recognition software. Similar sounding words can inadvertently be transcribed and may be missed upon review. Eilleen Kempf, MD 10/04/18

## 2018-10-04 NOTE — ED Notes (Signed)
Patient transported to CT 

## 2018-10-04 NOTE — ED Notes (Signed)
ED TO INPATIENT HANDOFF REPORT  Name/Age/Gender [name] 63 y.o. female  Code Status Code Status History    Date Active Date Inactive Code Status Order ID Comments User Context   05/11/2018 0810 05/14/2018 1822 Full Code [id number]  Gu[place]ford Shi, MD ED   08/07/2015 1421 08/07/2015 1931 Full Code [id number]  Sherren Mocha, MD Inpatient   01/04/2014 1615 01/04/2014 1949 Full Code [id number]  [name], MD Inpatient   05/08/2013 1454 05/09/2013 1606 Full Code [id number]  [name], MD Inpatient   Advance Care Planning Activity    Advance Directive Documentation     Most Recent Value  Type of Advance Directive  Healthcare Power of [place], Living will  Pre-existing out of facility DNR order (yellow form or pink MOST form)  -  "MOST" Form in Place?  -      Home/SNF/Other Home  Chief Complaint possible kidney infection  Level of Care/Admitting Diagnosis ED Disposition    ED Disposition Condition [place]: [place] [[id number]]  Level of Care: Med-Surg [16]  Covid Evaluation: Asymptomatic Screening Protocol (No Symptoms)  Diagnosis: AKI (acute kidney injury) [facility]) [[id number]]  Admitting Physician: Elmarie Shiley [phone]  Attending Physician: Niel Hummer A [[id number]]  PT Class (Do Not Modify): Observation [104]  PT Acc Code (Do Not Modify): Observation [10022]       Medical History Past Medical History:  Diagnosis Date  . Anemia    "long time ago" (12/06/2012)  . Anxiety   . Arthritis    "right knee real bad; in my hands bad" (12/06/2012)  . Asthma   . Celiac disease   . Colon polyps   . COPD (chronic obstructive pulmonary disease) ([place])   . Coronary artery disease    a. s/p Xience DES to [facility] 04/2008;  b. LHC 05/2008: Proximal RCA 25%, mid RCA stent patent.  c. Anormal nuc 2014 -> s/p LHC with severe mRCA stenosis s/p DES. d. Cath 04/2013 s/p DES to LAD.  e. LHC 08/2015 was stable.  . Depression     "years ago" (12/06/2012)  . Fibromyalgia   . Gallstones   . GERD (gastroesophageal reflux disease)   . H/O hiatal hernia   . Hepatitis    "not A, B, or C" (12/06/2012)  . Hyperlipidemia    intol to statins and other chol agents due to elevated LFTs  . Hypertension   . Hypothyroidism   . IBS (irritable bowel syndrome)   . Interstitial cystitis   . Lung cancer ([place]) dx'd 01/2018  . Lung nodules    Bilateral  . Migraines    "when I was younger" (12/06/2012)  . Pneumonia   . PONV (postoperative nausea and vomiting)    "Very bad"  . Premature atrial contractions   . PVC's (premature ventricular contractions)   . Sinus headache   . SVT (supraventricular tachycardia) (H[facility])     Allergies Allergies  Allergen Reactions  . Ciprofloxacin Hives  . Levofloxacin     UNSPECIFIED REACTION, BUT LIKELY HIVES > HIVES WITH CIPRO  . Statins Other (See Comments)    Liver enzymes Liver enzymes  . Tricor [Fenofibrate] Hives and Itching  . Latex Other (See Comments)    Patient states "Messes with my skin."  . Codeine Nausea And Vomiting  . Metoprolol Other (See Comments)    Does not tolerate beta blockers 10/02/2013-pt states she can tolerate 25 mg and lower     IV Location/Drains/Wounds Patient  Lines/Drains/Airways Status   Active Line/Drains/Airways    Name:   Placement date:   Placement time:   Site:   Days:   Implanted Port 04/29/18 Right Chest   04/29/18    1356    Chest   158   Peripheral IV 10/04/18 Right Antecubital   10/04/18    1217    Antecubital   less than 1          Labs/Imaging Results for orders placed or performed during the hospital encounter of 10/04/18 (from the past 48 hour(s))  CBC with Differential     Status: Abnormal   Collection Time: 10/04/18 11:26 AM  Result Value Ref Range   WBC 11.1 (H) 4.0 - 10.5 K/uL   RBC 3.42 (L) 3.87 - 5.11 MIL/uL   Hemoglobin 11.5 (L) 12.0 - 15.0 g/dL   HCT 34.1 (L) 36.0 - 46.0 %   MCV 99.7 80.0 - 100.0 fL   MCH 33.6 26.0 -  34.0 pg   MCHC 33.7 30.0 - 36.0 g/dL   RDW 12.9 11.5 - 15.5 %   Platelets 201 150 - 400 K/uL   nRBC 0.0 0.0 - 0.2 %   Neutrophils Relative % 72 %   Neutro Abs 8.1 (H) 1.7 - 7.7 K/uL   Lymphocytes Relative 15 %   Lymphs Abs 1.7 0.7 - 4.0 K/uL   Monocytes Relative 8 %   Monocytes Absolute 0.9 0.1 - 1.0 K/uL   Eosinophils Relative 4 %   Eosinophils Absolute 0.4 0.0 - 0.5 K/uL   Basophils Relative 1 %   Basophils Absolute 0.1 0.0 - 0.1 K/uL   Immature Granulocytes 0 %   Abs Immature Granulocytes 0.04 0.00 - 0.07 K/uL    Comment: Performed at [facility], [place] [address], [place], [place] [address]  Comprehensive metabolic panel     Status: Abnormal   Collection Time: 10/04/18 11:26 AM  Result Value Ref Range   Sodium 139 135 - 145 mmol/L   Potassium 3.9 3.5 - 5.1 mmol/L   Chloride 105 98 - 111 mmol/L   CO2 24 22 - 32 mmol/L   Glucose, Bld 93 70 - 99 mg/dL   BUN 28 (H) 8 - 23 mg/dL   Creatinine, Ser 2.81 (H) 0.44 - 1.00 mg/dL   Calcium 9.0 8.9 - 10.3 mg/dL   Total Protein 8.1 6.5 - 8.1 g/dL   Albumin 4.3 3.5 - 5.0 g/dL   AST 25 15 - 41 U/L   ALT 26 0 - 44 U/L   Alkaline Phosphatase 108 38 - 126 U/L   Total Bilirubin 0.8 0.3 - 1.2 mg/dL   GFR calc non Af Amer 17 (L) >60 mL/min   GFR calc Af Amer 20 (L) >60 mL/min   Anion gap 10 5 - 15    Comment: Performed at [facility], [place] [address] [place], [place] [address]  Lipase, blood     Status: None   Collection Time: 10/04/18 11:26 AM  Result Value Ref Range   Lipase 27 11 - 51 U/L    Comment: Performed at [facility], [place] [address] [place], [place] [address]  Urinalysis, Routine w reflex microscopic     Status: Abnormal   Collection Time: 10/04/18 11:26 AM  Result Value Ref Range   Color, Urine YELLOW YELLOW   APPearance HAZY (A) CLEAR   Specific Gravity, Urine 1.016 1.005 - 1.030   pH 6.0 5.0 - 8.0   Glucose,  UA NEGATIVE NEGATIVE mg/dL   Hgb urine dipstick SMALL  (A) NEGATIVE   Bilirubin Urine NEGATIVE NEGATIVE   Ketones, ur NEGATIVE NEGATIVE mg/dL   Protein, ur 100 (A) NEGATIVE mg/dL   Nitrite NEGATIVE NEGATIVE   Leukocytes,Ua MODERATE (A) NEGATIVE   RBC / HPF 11-20 0 - 5 RBC/hpf   WBC, UA >50 (H) 0 - 5 WBC/hpf   Bacteria, UA MANY (A) NONE SEEN   Squamous Epithelial / LPF 0-5 0 - 5   Non Squamous Epithelial 0-5 (A) NONE SEEN    Comment: Performed at [facility], [place] [address] [place], [place] [address]  SARS Coronavirus 2 [facility] order, Performed in [facility] hospital lab) Nasopharyngeal Nasopharyngeal Swab     Status: None   Collection Time: 10/04/18  2:47 PM   Specimen: Nasopharyngeal Swab  Result Value Ref Range   SARS Coronavirus 2 NEGATIVE NEGATIVE    Comment: (NOTE) If result is NEGATIVE SARS-CoV-2 target nucleic acids are NOT DETECTED. The SARS-CoV-2 RNA is generally detectable in upper and lower  respiratory specimens during the acute phase of infection. The lowest  concentration of SARS-CoV-2 viral copies this assay can detect is 250  copies / mL. A negative result does not preclude SARS-CoV-2 infection  and should not be used as the sole basis for treatment or other  patient management decisions.  A negative result may occur with  improper specimen collection / handling, submission of specimen other  than nasopharyngeal swab, presence of viral mutation(s) within the  areas targeted by this assay, and inadequate number of viral copies  (<250 copies / mL). A negative result must be combined with clinical  observations, patient history, and epidemiological information. If result is POSITIVE SARS-CoV-2 target nucleic acids are DETECTED. The SARS-CoV-2 RNA is generally detectable in upper and lower  respiratory specimens dur ing the acute phase of infection.  Positive  results are indicative of active infection with SARS-CoV-2.  Clinical  correlation with patient history and other diagnostic information is   necessary to determine patient infection status.  Positive results do  not rule out bacterial infection or co-infection with other viruses. If result is PRESUMPTIVE POSTIVE SARS-CoV-2 nucleic acids MAY BE PRESENT.   A presumptive positive result was obtained on the submitted specimen  and confirmed on repeat testing.  While 2019 novel coronavirus  (SARS-CoV-2) nucleic acids may be present in the submitted sample  additional confirmatory testing may be necessary for epidemiological  and / or clinical management purposes  to differentiate between  SARS-CoV-2 and other Sarbecovirus currently known to infect humans.  If clinically indicated additional testing with an alternate test  methodology [phone]) is advised. The SARS-CoV-2 RNA is generally  detectable in upper and lower respiratory sp ecimens during the acute  phase of infection. The expected result is Negative. Fact Sheet for Patients:  StrictlyIdeas.no Fact Sheet for Healthcare Providers: BankingDealers.co.za This test is not yet approved or cleared by the [place] FDA and has been authorized for detection and/or diagnosis of SARS-CoV-2 by FDA under an Emergency Use Authorization (EUA).  This EUA will remain in effect (meaning this test can be used) for the duration of the COVID-19 declaration under Section 564(b)(1) of the Act, 21 U.S.C. section 360bbb-3(b)(1), unless the authorization is terminated or revoked sooner. Performed at [facility], [place] [address] [place],  [address]    Dg Chest 2 View  Result Date: 10/04/2018 CLINICAL DATA:  Cough and wheezing EXAM: CHEST - 2  VIEW COMPARISON:  February 17, 2018 FINDINGS: Port-A-Cath tip is in the superior vena cava. No pneumothorax. No edema or consolidation. Heart size and pulmonary vascularity are normal. No adenopathy. No bone lesions. A stent is noted in the right coronary artery. IMPRESSION: No  edema or consolidation. No adenopathy. Port-A-Cath tip in superior vena cava. Electronically Signed   By: Lowella Grip III M.D.   On: 10/04/2018 14:04   Ct Renal Stone Study  Result Date: 10/04/2018 CLINICAL DATA:  Right low back and pelvic pain for 1.5 weeks. Abnormal urinalysis and elevated white blood cell count. Burning with urination. History of lung cancer. EXAM: CT ABDOMEN AND PELVIS WITHOUT CONTRAST TECHNIQUE: Multidetector CT imaging of the abdomen and pelvis was performed following the standard protocol without IV contrast. COMPARISON:  CT abdomen and pelvis 05/11/2018. FINDINGS: Lower chest: Lung bases are clear. No pleural or pericardial effusion. Hepatobiliary: No focal liver abnormality is seen. No gallstones, gallbladder wall thickening, or biliary dilatation. Pancreas: Unremarkable. No pancreatic ductal dilatation or surrounding inflammatory changes. Spleen: Normal in size without focal abnormality. Adrenals/Urinary Tract: Adrenal glands are unremarkable. Kidneys are normal, without renal calculi, focal lesion, or hydronephrosis. Bladder is unremarkable. Stomach/Bowel: Stomach is within normal limits. Appendix appears normal. No evidence of bowel wall thickening, distention, or inflammatory changes. Vascular/Lymphatic: Aortic atherosclerosis. No enlarged abdominal or pelvic lymph nodes. Reproductive: Status post hysterectomy. No adnexal masses. Other: None. Musculoskeletal: No focal lesion. No fracture. Degenerative disc disease lower lumbar spine noted. IMPRESSION: Negative for urinary tract stone. No acute abnormality abdomen or pelvis. Atherosclerosis. Electronically Signed   By: Inge Rise M.D.   On: 10/04/2018 14:00    Pending Labs Unresulted Labs (From admission, onward)    Start     Ordered   10/04/18 1525  Sodium, urine, random  Add-on,   AD     10/04/18 1524   10/04/18 1525  Protein / creatinine ratio, urine  Add-on,   AD     10/04/18 1524   10/04/18 1525  Creatinine,  urine, random  Add-on,   AD     10/04/18 1524   10/04/18 1119  Urine culture  ONCE - STAT,   STAT     10/04/18 1118   Signed and Held  Basic metabolic panel  Tomorrow morning,   R     Signed and Held   Signed and Held  CBC  Tomorrow morning,   R     Signed and Held          Vitals/Pain Today's Vitals   10/04/18 1630 10/04/18 1710 10/04/18 1803 10/04/18 1830  BP: (!) 147/106  (!) 154/102 (!) 160/76  Pulse: 71 70 73 65  Resp: 17  16 17   Temp:      TempSrc:      SpO2: 100% 98% 100% 100%  PainSc:        Isolation Precautions No active isolations  Medications Medications  0.9 %  sodium chloride infusion ( Intravenous New Bag/Given 10/04/18 1526)  cefTRIAXone (ROCEPHIN) 1 g in sodium chloride 0.9 % 100 mL IVPB (has no administration in time range)  ipratropium-albuterol (DUONEB) 0.5-2.5 (3) MG/3ML nebulizer solution 3 mL (has no administration in time range)  aspirin chewable tablet 81 mg (has no administration in time range)  buPROPion (WELLBUTRIN SR) 12 hr tablet 150 mg (has no administration in time range)  DULoxetine (CYMBALTA) DR capsule 120 mg (has no administration in time range)  folic acid (FOLVITE) tablet 1 mg (has no administration in time  range)  HYDROcodone-acetaminophen (NORCO) 7.5-325 MG per tablet 1 tablet (has no administration in time range)  LORazepam (ATIVAN) tablet 2 mg (has no administration in time range)  metoprolol tartrate (LOPRESSOR) tablet 12.5 mg (has no administration in time range)  pantoprazole (PROTONIX) EC tablet 40 mg (has no administration in time range)  sodium chloride 0.9 % bolus 1,000 mL (0 mLs Intravenous Stopped 10/04/18 1327)  albuterol (VENTOLIN HFA) 108 (90 Base) MCG/ACT inhaler 8 puff (8 puffs Inhalation Given 10/04/18 1211)  AeroChamber Plus Flo-Vu Medium MISC 1 each (1 each Other Given 10/04/18 1212)  cefTRIAXone (ROCEPHIN) 1 g in sodium chloride 0.9 % 100 mL IVPB (0 g Intravenous Stopped 10/04/18 1523)    Mobility walks

## 2018-10-04 NOTE — H&P (Addendum)
History and Physical  [name] MEQ:[id number] DOB: 1955/03/22 DOA: 10/04/2018  PCP: [name], NP Patient coming from: Home  I have personally briefly reviewed patient's old medical records in [place]   Chief Complaint: Flank pain, suprapubic pain.  HPI: [name] is a 63 y.o. female with past medical history significant for stage IV non-small cell lung cancer, adenocarcinoma, currently on chemotherapy with carboplatin, Alimta, Keytruda, asthma, COPD, celiac disease, coronary artery disease a status post DES stent 2010 who presents complaining of flank pain.  She went to see her oncologist for chemotherapy today, but she was found to be in acute renal failure.  She was sent to the emergency department for further evaluation.  Patient report right side flank pain, 7 out of 10, for a week.  She also reports dysuria, her urine had a lot of bubbles.  She denies nausea, vomiting, diarrhea.  She feels thirsty.  He relates chills.  She denies sore throat.  She has on and off shortness of breath from her asthma.  She completed a course of prednisone prescribed by her doctor.  Evaluation in the ED: Sodium 139, potassium 3.9, BUN 28, creatinine 2.8, AST 25, ALT 26, hemoglobin 11.5, white blood cell 11.1, platelets 201.  UA with 100 proteins, many bacteria, white blood cell more than 50. CT renal stone protocol negative. Chest x-ray: No consolidation.   Review of Systems: All systems reviewed and apart from history of presenting illness, are negative.  Past Medical History:  Diagnosis Date   Anemia    "long time ago" (12/06/2012)   Anxiety    Arthritis    "right knee real bad; in my hands bad" (12/06/2012)   Asthma    Celiac disease    Colon polyps    COPD (chronic obstructive pulmonary disease) (HCC)    Coronary artery disease    a. s/p Xience DES to [facility] 04/2008;  b. LHC 05/2008: Proximal RCA 25%, mid RCA stent patent.  c. Anormal nuc 2014 -> s/p LHC with  severe mRCA stenosis s/p DES. d. Cath 04/2013 s/p DES to LAD.  e. LHC 08/2015 was stable.   Depression    "years ago" (12/06/2012)   Fibromyalgia    Gallstones    GERD (gastroesophageal reflux disease)    H/O hiatal hernia    Hepatitis    "not A, B, or C" (12/06/2012)   Hyperlipidemia    intol to statins and other chol agents due to elevated LFTs   Hypertension    Hypothyroidism    IBS (irritable bowel syndrome)    Interstitial cystitis    Lung cancer ([place]) dx'd 01/2018   Lung nodules    Bilateral   Migraines    "when I was younger" (12/06/2012)   Pneumonia    PONV (postoperative nausea and vomiting)    "Very bad"   Premature atrial contractions    PVC's (premature ventricular contractions)    Sinus headache    SVT (supraventricular tachycardia) ([place])    Past Surgical History:  Procedure Laterality Date   CARDIAC CATHETERIZATION  04/2008; 05/2008   CARDIAC CATHETERIZATION N/A 08/07/2015   Procedure: Left Heart Cath and Coronary Angiography;  Surgeon: Sherren Mocha, MD;  Location: [place] [facility];  Service: Cardiovascular;  Laterality: N/A;   CHOLECYSTECTOMY     CORONARY ANGIOPLASTY WITH STENT PLACEMENT  04/2008 12/06/2012   "1 + 1" (12/06/2012)   CORONARY STENT PLACEMENT  05/08/2013   DES TO LAD  DR Angelena Form   IR IMAGING GUIDED PORT INSERTION  04/29/2018   LEFT HEART CATHETERIZATION WITH CORONARY ANGIOGRAM N/A 12/06/2012   Procedure: LEFT HEART CATHETERIZATION WITH CORONARY ANGIOGRAM;  Surgeon: [name], MD;  Location: [facility] CATH LAB;  Service: Cardiovascular;  Laterality: N/A;   LEFT HEART CATHETERIZATION WITH CORONARY ANGIOGRAM N/A 05/08/2013   Procedure: LEFT HEART CATHETERIZATION WITH CORONARY ANGIOGRAM;  Surgeon: [name], MD;  Location: [facility] CATH LAB;  Service: Cardiovascular;  Laterality: N/A;   LEFT HEART CATHETERIZATION WITH CORONARY ANGIOGRAM N/A 01/04/2014   Procedure: LEFT HEART CATHETERIZATION WITH CORONARY ANGIOGRAM;   Surgeon: [name], MD;  Location: [facility] CATH LAB;  Service: Cardiovascular;  Laterality: N/A;   PERCUTANEOUS CORONARY STENT INTERVENTION (PCI-S)  12/06/2012   Procedure: PERCUTANEOUS CORONARY STENT INTERVENTION (PCI-S);  Surgeon: [name], MD;  Location: [facility] CATH LAB;  Service: Cardiovascular;;   PERCUTANEOUS CORONARY STENT INTERVENTION (PCI-S)  05/08/2013   Procedure: PERCUTANEOUS CORONARY STENT INTERVENTION (PCI-S);  Surgeon: [name], MD;  Location: [facility] CATH LAB;  Service: Cardiovascular;;  mid LAD    TUBAL LIGATION     VAGINAL HYSTERECTOMY     VIDEO BRONCHOSCOPY WITH ENDOBRONCHIAL NAVIGATION N/A 02/17/2018   Procedure: VIDEO BRONCHOSCOPY WITH ENDOBRONCHIAL NAVIGATION;  Surgeon: Melrose Nakayama, MD;  Location: [place];  Service: Thoracic;  Laterality: N/A;   Social History:  reports that she quit smoking about 11 years ago. Her smoking use included cigarettes. She has a 15.00 pack-year smoking history. She has never used smokeless tobacco. She reports that she does not drink alcohol or use drugs.   Allergies  Allergen Reactions   Ciprofloxacin Hives   Levofloxacin     UNSPECIFIED REACTION, BUT LIKELY HIVES > HIVES WITH CIPRO   Statins Other (See Comments)    Liver enzymes Liver enzymes   Tricor [Fenofibrate] Hives and Itching   Latex Other (See Comments)    Patient states "Messes with my skin."   Codeine Nausea And Vomiting   Metoprolol Other (See Comments)    Does not tolerate beta blockers 10/02/2013-pt states she can tolerate 25 mg and lower     Family History  Problem Relation Age of Onset   Colon cancer Father 49   Lung disease Father    Emphysema Mother    Drug abuse Brother    Hepatitis C Brother    Drug abuse Brother    Celiac disease Son    Celiac disease Grandson    Down syndrome Grandson    Diabetes Granddaughter    CAD Neg Hx       Prior to Admission medications   Medication Sig Start Date End  Date Taking? Authorizing Provider  albuterol (PROAIR HFA) 108 (90 Base) MCG/ACT inhaler Inhale 1 puff into the lungs every 6 (six) hours as needed (wheezing/shortness of breath.).     [provider]  albuterol (PROVENTIL) (5 MG/ML) 0.5% nebulizer solution Take 2.5 mg by nebulization every 6 (six) hours as needed for wheezing.    [provider]  amLODipine (NORVASC) 5 MG tablet Take 1.5 tablets (7.5 mg total) by mouth daily. Patient taking differently: Take 7.5 mg by mouth every evening.  01/10/18 05/18/18  [name], MD  aspirin 81 MG chewable tablet Chew 1 tablet (81 mg total) by mouth daily. 12/07/12   Barrett, Evelene Croon, PA-C  BELSOMRA 20 MG TABS Take 20 mg by mouth at bedtime as needed for sleep. 04/29/18   [provider]  buPROPion [facility] SR)  150 MG 12 hr tablet Take 150-300 mg by mouth See admin instructions. Take 2 tablets (300 mg) by mouth daily in the morning & take 1 tablet (150 mg) by mouth in the evening.    [provider]  DULoxetine (CYMBALTA) 60 MG capsule Take 120 mg by mouth daily. 04/28/18   [provider]  ergocalciferol (VITAMIN D2) 50000 UNITS capsule Take 50,000 Units by mouth every Wednesday.     [provider]  folic acid (FOLVITE) 1 MG tablet Take 1 tablet (1 mg total) by mouth daily. 04/05/18   Curt Bears, MD  HYDROcodone-acetaminophen [facility]) 7.5-325 MG per tablet Take 1 tablet by mouth every 6 (six) hours as needed for pain.    [provider]  levothyroxine (SYNTHROID, LEVOTHROID) 25 MCG tablet Take 25 mcg by mouth daily before breakfast.    [provider]  lidocaine-prilocaine (EMLA) cream Apply 1 application topically as needed. 04/19/18   Heilingoetter, Cassandra L, PA-C  loperamide (IMODIUM) 2 MG capsule Take 1 capsule (2 mg total) by mouth as needed for diarrhea or loose stools (Initiate if stool C diff -ve). Patient not taking: Reported on 10/04/2018 05/14/18   Hosie Poisson,  MD  LORazepam (ATIVAN) 2 MG tablet Take 2 mg by mouth See admin instructions. Take 1 tablet (2 mg) by mouth scheduled at bedtime & take 1 tablet (2 mg) by mouth twice daily if needed for anxiety    [provider]  metoprolol tartrate (LOPRESSOR) 25 MG tablet Take 25 mg by mouth 2 (two) times daily.    [provider]  montelukast (SINGULAIR) 10 MG tablet Take 10 mg by mouth at bedtime.     [provider]  nitroGLYCERIN (NITROSTAT) 0.4 MG SL tablet Place 1 tablet (0.4 mg total) under the tongue every 5 (five) minutes as needed for chest pain. Patient not taking: Reported on 10/04/2018 12/07/12   Barrett, Evelene Croon, PA-C  omeprazole (PRILOSEC) 40 MG capsule Take 1 tablet by mouth twice a day for 2 weeks then 1 tablet every morning thereafter 05/18/18   Esterwood, Amy S, PA-C  ondansetron (ZOFRAN ODT) 4 MG disintegrating tablet Take 1 tablet (4 mg total) by mouth every 8 (eight) hours as needed for nausea or vomiting. Patient not taking: Reported on 10/04/2018 05/23/18   Heilingoetter, Cassandra L, PA-C  ondansetron (ZOFRAN) 8 MG tablet Take 1 tablet (8 mg total) by mouth every 8 (eight) hours as needed for nausea or vomiting. 06/20/18   Heilingoetter, Cassandra L, PA-C  prochlorperazine (COMPAZINE) 10 MG tablet TAKE 1 TABLET(10 MG) BY MOUTH EVERY 6 HOURS AS NEEDED FOR NAUSEA OR VOMITING 06/09/18   Curt Bears, MD  [facility] 160-4.5 MCG/ACT inhaler Take 2 puffs by mouth 2 (two) times daily. 12/08/17   [provider]  temazepam (RESTORIL) 15 MG capsule Take 15 mg by mouth at bedtime.     [provider]  vitamin B-12 100 MCG tablet Take 1 tablet (100 mcg total) by mouth daily. 05/15/18   Hosie Poisson, MD   Physical Exam: Vitals:   10/04/18 1434 10/04/18 1627 10/04/18 1630 10/04/18 1710  BP:   (!) 147/106   Pulse: 70 70 71 70  Resp:   17   Temp:      TempSrc:      SpO2: 99% 100% 100% 98%     General exam: Moderately built and nourished patient, lying  comfortably supine on the gurney in no obvious distress.  Head, eyes and ENT: Nontraumatic and normocephalic.  Pupils equally reacting to light and accommodation. Oral mucosa moist.  Neck: Supple. No JVD, carotid bruit or thyromegaly.  Lymphatics: No lymphadenopathy.  Respiratory system: Clear to auscultation. No increased work of breathing.  Cardiovascular system: S1 and S2 heard, RRR. No JVD, murmurs, gallops, clicks or pedal edema.  Gastrointestinal system: Abdomen is nondistended, soft and nontender. Normal bowel sounds heard. No organomegaly or masses appreciated.  Central nervous system: Alert and oriented. No focal neurological deficits.  Extremities: Symmetric 5 x 5 power. Peripheral pulses symmetrically felt.   Skin: No rashes or acute findings.  Musculoskeletal system: Negative exam.  Psychiatry: Pleasant and cooperative.   Labs on Admission:  Basic Metabolic Panel: Recent Labs  Lab 10/04/18 0902 10/04/18 1126  NA 141 139  K 3.7 3.9  CL 106 105  CO2 23 24  GLUCOSE 160* 93  BUN 24* 28*  CREATININE 2.93* 2.81*  CALCIUM 9.2 9.0   Liver Function Tests: Recent Labs  Lab 10/04/18 0902 10/04/18 1126  AST 24 25  ALT 26 26  ALKPHOS 111 108  BILITOT 0.4 0.8  PROT 7.4 8.1  ALBUMIN 3.8 4.3   Recent Labs  Lab 10/04/18 1126  LIPASE 27   No results for input(s): AMMONIA in the last 168 hours. CBC: Recent Labs  Lab 10/04/18 0902 10/04/18 1126  WBC 10.8* 11.1*  NEUTROABS 7.9* 8.1*  HGB 11.3* 11.5*  HCT 33.3* 34.1*  MCV 96.8 99.7  PLT 193 201   Cardiac Enzymes: No results for input(s): CKTOTAL, CKMB, CKMBINDEX, TROPONINI in the last 168 hours.  BNP (last 3 results) Recent Labs    01/10/18 1630  PROBNP 69   CBG: No results for input(s): GLUCAP in the last 168 hours.  Radiological Exams on Admission: Dg Chest 2 View  Result Date: 10/04/2018 CLINICAL DATA:  Cough and wheezing EXAM: CHEST - 2 VIEW COMPARISON:  February 17, 2018 FINDINGS:  Port-A-Cath tip is in the superior vena cava. No pneumothorax. No edema or consolidation. Heart size and pulmonary vascularity are normal. No adenopathy. No bone lesions. A stent is noted in the right coronary artery. IMPRESSION: No edema or consolidation. No adenopathy. Port-A-Cath tip in superior vena cava. Electronically Signed   By: Lowella Grip III M.D.   On: 10/04/2018 14:04   Ct Renal Stone Study  Result Date: 10/04/2018 CLINICAL DATA:  Right low back and pelvic pain for 1.5 weeks. Abnormal urinalysis and elevated white blood cell count. Burning with urination. History of lung cancer. EXAM: CT ABDOMEN AND PELVIS WITHOUT CONTRAST TECHNIQUE: Multidetector CT imaging of the abdomen and pelvis was performed following the standard protocol without IV contrast. COMPARISON:  CT abdomen and pelvis 05/11/2018. FINDINGS: Lower chest: Lung bases are clear. No pleural or pericardial effusion. Hepatobiliary: No focal liver abnormality is seen. No gallstones, gallbladder wall thickening, or biliary dilatation. Pancreas: Unremarkable. No pancreatic ductal dilatation or surrounding inflammatory changes. Spleen: Normal in size without focal abnormality. Adrenals/Urinary Tract: Adrenal glands are unremarkable. Kidneys are normal, without renal calculi, focal lesion, or hydronephrosis. Bladder is unremarkable. Stomach/Bowel: Stomach is within normal limits. Appendix appears normal. No evidence of bowel wall thickening, distention, or inflammatory changes. Vascular/Lymphatic: Aortic atherosclerosis. No enlarged abdominal or pelvic lymph nodes. Reproductive: Status post hysterectomy. No adnexal masses. Other: None. Musculoskeletal: No focal lesion. No fracture. Degenerative disc disease lower lumbar spine noted. IMPRESSION: Negative for urinary tract stone. No acute abnormality abdomen or pelvis. Atherosclerosis. Electronically Signed   By: Inge Rise M.D.   On: 10/04/2018 14:00  Assessment/Plan Active  Problems:   Hyperlipemia   Asthma   SVT (supraventricular tachycardia) (HCC)   AKI (acute kidney injury) ([place])   Lung cancer ([place])   Acute lower UTI   1-AKI: Present with a creatinine at 2.8.  Prior creatinine per records 1.00.9 Unclear etiology, of note carboplatin and Keytruda can increase serum creatinine She denies taking ibuprofen, no nausea vomiting. She does have evidence of UTI. We will continue with IV fluids. strict I's and O's. Will check urine sodium, urine creatinine, protein creatinine ratio. If renal function does not improve she might need nephrology evaluation   2-Asthma;  Continue with a schedule nebulizer  3-screening SARS coronavirus 2-.  4-UTI, Pyelonephritis;  She complains of flank pain.  UA with more than 50 white blood cell. Treated with IV ceftriaxone Follow urine culture.  5-History of lung cancer: Follows with Dr. Julien Nordmann 6-Anxiety; on ativan . 7-HTN; hold norvac for now to avoid hypotension in setting AKI and infection.   DVT Prophylaxis; heparin Code Status: She wishes to be DNR Family Communication: Discussed with patient Disposition Plan: Admit under observation for treatment of AKI and UTI  Time spent: 75 minutes.   Elmarie Shiley MD [facility]   10/04/2018, 5:53 PM

## 2018-10-04 NOTE — Progress Notes (Signed)
Nutrition  RD planning on meeting with patient during chemotherapy infusion today but held and sent to ED.  RD unable to meet with patient on 7/14 during chemo treatment due to patient not coming to treatment.  Nutrition visit rescheduled to 8/25 next infusion.    Danton Palmateer B. Zenia Resides, [place], [place] Registered Dietitian [phone] (pager)

## 2018-10-04 NOTE — ED Notes (Signed)
Hospitilist at bedside.

## 2018-10-05 ENCOUNTER — Telehealth: Payer: Self-pay | Admitting: Physician Assistant

## 2018-10-05 DIAGNOSIS — J449 Chronic obstructive pulmonary disease, unspecified: Secondary | ICD-10-CM | POA: Diagnosis present

## 2018-10-05 DIAGNOSIS — I10 Essential (primary) hypertension: Secondary | ICD-10-CM | POA: Diagnosis present

## 2018-10-05 DIAGNOSIS — Z66 Do not resuscitate: Secondary | ICD-10-CM | POA: Diagnosis present

## 2018-10-05 DIAGNOSIS — F419 Anxiety disorder, unspecified: Secondary | ICD-10-CM | POA: Diagnosis present

## 2018-10-05 DIAGNOSIS — J4520 Mild intermittent asthma, uncomplicated: Secondary | ICD-10-CM | POA: Diagnosis present

## 2018-10-05 DIAGNOSIS — N10 Acute tubulo-interstitial nephritis: Secondary | ICD-10-CM | POA: Diagnosis present

## 2018-10-05 DIAGNOSIS — T451X5A Adverse effect of antineoplastic and immunosuppressive drugs, initial encounter: Secondary | ICD-10-CM | POA: Diagnosis present

## 2018-10-05 DIAGNOSIS — N3010 Interstitial cystitis (chronic) without hematuria: Secondary | ICD-10-CM | POA: Diagnosis present

## 2018-10-05 DIAGNOSIS — N390 Urinary tract infection, site not specified: Secondary | ICD-10-CM | POA: Diagnosis not present

## 2018-10-05 DIAGNOSIS — K900 Celiac disease: Secondary | ICD-10-CM | POA: Diagnosis present

## 2018-10-05 DIAGNOSIS — Z85118 Personal history of other malignant neoplasm of bronchus and lung: Secondary | ICD-10-CM | POA: Diagnosis not present

## 2018-10-05 DIAGNOSIS — E785 Hyperlipidemia, unspecified: Secondary | ICD-10-CM | POA: Diagnosis present

## 2018-10-05 DIAGNOSIS — K219 Gastro-esophageal reflux disease without esophagitis: Secondary | ICD-10-CM | POA: Diagnosis present

## 2018-10-05 DIAGNOSIS — D696 Thrombocytopenia, unspecified: Secondary | ICD-10-CM | POA: Diagnosis present

## 2018-10-05 DIAGNOSIS — D6481 Anemia due to antineoplastic chemotherapy: Secondary | ICD-10-CM | POA: Diagnosis present

## 2018-10-05 DIAGNOSIS — N179 Acute kidney failure, unspecified: Secondary | ICD-10-CM | POA: Diagnosis present

## 2018-10-05 DIAGNOSIS — I471 Supraventricular tachycardia: Secondary | ICD-10-CM | POA: Diagnosis present

## 2018-10-05 DIAGNOSIS — M797 Fibromyalgia: Secondary | ICD-10-CM | POA: Diagnosis present

## 2018-10-05 DIAGNOSIS — Z955 Presence of coronary angioplasty implant and graft: Secondary | ICD-10-CM | POA: Diagnosis not present

## 2018-10-05 DIAGNOSIS — I2510 Atherosclerotic heart disease of native coronary artery without angina pectoris: Secondary | ICD-10-CM | POA: Diagnosis present

## 2018-10-05 DIAGNOSIS — Z8719 Personal history of other diseases of the digestive system: Secondary | ICD-10-CM | POA: Diagnosis not present

## 2018-10-05 DIAGNOSIS — F329 Major depressive disorder, single episode, unspecified: Secondary | ICD-10-CM | POA: Diagnosis present

## 2018-10-05 DIAGNOSIS — Z20828 Contact with and (suspected) exposure to other viral communicable diseases: Secondary | ICD-10-CM | POA: Diagnosis present

## 2018-10-05 DIAGNOSIS — R102 Pelvic and perineal pain: Secondary | ICD-10-CM | POA: Diagnosis present

## 2018-10-05 DIAGNOSIS — E039 Hypothyroidism, unspecified: Secondary | ICD-10-CM | POA: Diagnosis present

## 2018-10-05 DIAGNOSIS — D638 Anemia in other chronic diseases classified elsewhere: Secondary | ICD-10-CM | POA: Diagnosis present

## 2018-10-05 LAB — BASIC METABOLIC PANEL
Anion gap: 7 (ref 5–15)
BUN: 24 mg/dL — ABNORMAL HIGH (ref 8–23)
CO2: 23 mmol/L (ref 22–32)
Calcium: 8.3 mg/dL — ABNORMAL LOW (ref 8.9–10.3)
Chloride: 113 mmol/L — ABNORMAL HIGH (ref 98–111)
Creatinine, Ser: 2.58 mg/dL — ABNORMAL HIGH (ref 0.44–1.00)
GFR calc Af Amer: 22 mL/min — ABNORMAL LOW (ref >60)
GFR calc non Af Amer: 19 mL/min — ABNORMAL LOW (ref >60)
Glucose, Bld: 85 mg/dL (ref 70–99)
Potassium: 3.6 mmol/L (ref 3.5–5.1)
Sodium: 143 mmol/L (ref 135–145)

## 2018-10-05 LAB — URINE CULTURE

## 2018-10-05 LAB — URINALYSIS, ROUTINE W REFLEX MICROSCOPIC
Bilirubin Urine: NEGATIVE
Glucose, UA: NEGATIVE mg/dL
Ketones, ur: NEGATIVE mg/dL
Nitrite: NEGATIVE
Protein, ur: NEGATIVE mg/dL
Specific Gravity, Urine: 1.005 (ref 1.005–1.030)
pH: 7 (ref 5.0–8.0)

## 2018-10-05 LAB — CBC
HCT: 28.8 % — ABNORMAL LOW (ref 36.0–46.0)
Hemoglobin: 9.3 g/dL — ABNORMAL LOW (ref 12.0–15.0)
MCH: 32.7 pg (ref 26.0–34.0)
MCHC: 32.3 g/dL (ref 30.0–36.0)
MCV: 101.4 fL — ABNORMAL HIGH (ref 80.0–100.0)
Platelets: 153 10*3/uL (ref 150–400)
RBC: 2.84 MIL/uL — ABNORMAL LOW (ref 3.87–5.11)
RDW: 12.7 % (ref 11.5–15.5)
WBC: 8.4 10*3/uL (ref 4.0–10.5)
nRBC: 0 % (ref 0.0–0.2)

## 2018-10-05 LAB — SODIUM, URINE, RANDOM: Sodium, Ur: 76 mmol/L

## 2018-10-05 LAB — PROTEIN / CREATININE RATIO, URINE
Creatinine, Urine: 185.39 mg/dL
Total Protein, Urine: <6 mg/dL

## 2018-10-05 LAB — CREATININE, URINE, RANDOM: Creatinine, Urine: 185.15 mg/dL

## 2018-10-05 MED ORDER — SODIUM CHLORIDE 0.9% FLUSH: 3.0000 mL | INTRAVENOUS | Status: DC | PRN

## 2018-10-05 MED ORDER — SODIUM CHLORIDE 0.9% IV SOLUTION: 250.0000 mL | Freq: Once | INTRAVENOUS | Status: DC

## 2018-10-05 MED ORDER — SODIUM CHLORIDE 0.9% FLUSH: 10.0000 mL | INTRAVENOUS | Status: DC | PRN

## 2018-10-05 MED ORDER — HEPARIN SOD (PORK) LOCK FLUSH 100 UNIT/ML IV SOLN: 500.0000 [IU] | Freq: Every day | INTRAVENOUS | Status: DC | PRN

## 2018-10-05 MED ORDER — ALUM & MAG HYDROXIDE-SIMETH 200-200-20 MG/5ML PO SUSP
15.0000 mL | ORAL | Status: DC | PRN
  Administered 2018-10-05: 15 mL via ORAL
  Filled 2018-10-05: qty 30

## 2018-10-05 MED ORDER — HEPARIN SOD (PORK) LOCK FLUSH 100 UNIT/ML IV SOLN
250.0000 [IU] | INTRAVENOUS | Status: AC | PRN
  Administered 2018-10-08: 250 [IU]

## 2018-10-05 NOTE — Plan of Care (Signed)
  Problem: Nutrition: Goal: Adequate nutrition will be maintained Outcome: Progressing   Problem: Pain Managment: Goal: General experience of comfort will improve Outcome: Progressing   Problem: Safety: Goal: Ability to remain free from injury will improve Outcome: Progressing   

## 2018-10-05 NOTE — Telephone Encounter (Signed)
Scheduled appt per 8/04 los - pt aware of appt date and time . And will check my chart

## 2018-10-05 NOTE — Progress Notes (Addendum)
[facility]                                                                              Patient Demographics  [name], is a 63 y.o. female, DOB - 1955-11-22, WNI:[id number]  Admit date - 10/04/2018   Admitting Physician Elmarie Shiley, MD  Outpatient Primary MD for the patient is [name], NP  Outpatient specialists:   LOS - 0  days   Medical records reviewed and are as summarized below:    Chief Complaint  Patient presents with   Pelvic Pain   Flank Pain   Cancer Patient       Brief summary   Patient is a 63 year old female with stage IV NSCL Ca, adenocarcinoma, currently on chemotherapy with carboplatin, Alimta, Keytruda, asthma, COPD, celiac disease, CAD status post DES stent 2010 presented with complaints of flank pain.  Patient went to see her oncologist for chemotherapy and was found to be in acute renal failure.  Patient was sent to ED for further evaluation. She also reported right-sided flank pain 7/10 for almost a week, dysuria, reported chills prior to admission.  Denied any nausea vomiting diarrhea.  Recently completed a course of prednisone.  CT renal stone protocol negative Chest x-ray showed no consolidation  Assessment & Plan    Principal Problem:   AKI (acute kidney injury) ([place]) -Presented with creatinine of 2.93, baseline Cr 0.8 -Unclear etiology, possibly due to carboplatin and Keytruda, denies taking any NSAIDs, no diarrhea or nausea or vomiting.  UA positive for UTI however urine culture inconclusive -Continue IV fluid hydration today, creatinine improving 2.5 -If no significant improvement in next 24 hours, may warrant nephrology consult  Active Problems: Acute UTI/clinical pyelonephritis -Patient reported right-sided flank pain, dysuria, chills PTA, had leukocytosis, UA positive for UTI however urine culture inconclusive -Continue IV Rocephin -CT renal stone study was negative for urinary tract stone or any  acute abnormality  Asthma, intermittent -Currently no wheezing, continue scheduled nebs, Singulair, Dulera  History of lung CA -Follows with Dr. Julien Nordmann, was seen at [place] on 8/4  Hypertension -Continue to hold Norvasc, BP soft  Code Status: DNR DVT Prophylaxis: Heparin subcu Family Communication: Discussed in detail with the patient, all imaging results, lab results explained to the patient and husband.     Disposition Plan: Remains inpatient for continued IV fluid hydration, trend renal function, repeat UA and culture.  Creatinine currently not at baseline.  Time Spent in minutes   35 minutes  Procedures:  CT renal stone study  Consultants:   None, sent by oncology from cancer center  Antimicrobials:   Anti-infectives (From admission, onward)   Start     Dose/Rate Route Frequency Ordered Stop   10/05/18 1400  cefTRIAXone (ROCEPHIN) 1 g in sodium chloride 0.9 % 100 mL IVPB     1 g 200 mL/hr over 30 Minutes Intravenous Every 24 hours 10/04/18 1518     10/04/18 1330  cefTRIAXone (ROCEPHIN) 1 g in sodium chloride 0.9 % 100 mL IVPB     1 g 200 mL/hr over 30 Minutes Intravenous  Once 10/04/18 1316 10/04/18 1523  Medications  Scheduled Meds:  sodium chloride  250 mL Intravenous Once   aspirin  81 mg Oral Daily   buPROPion  150 mg Oral Daily   DULoxetine  120 mg Oral Daily   folic acid  1 mg Oral QPM   heparin  5,000 Units Subcutaneous Q8H   ipratropium-albuterol  3 mL Nebulization TID   levothyroxine  25 mcg Oral QAC breakfast   LORazepam  2 mg Oral QPM   metoprolol tartrate  12.5 mg Oral BID   mometasone-formoterol  2 puff Inhalation BID   montelukast  10 mg Oral QHS   pantoprazole  40 mg Oral Daily   Continuous Infusions:  sodium chloride 100 mL/hr at 10/05/18 0045   cefTRIAXone (ROCEPHIN)  IV     PRN Meds:.acetaminophen **OR** acetaminophen, albuterol, heparin lock flush, heparin lock flush,  HYDROcodone-acetaminophen, senna-docusate, sodium chloride flush, sodium chloride flush, sodium chloride flush      Subjective:   [name] was seen and examined today.  Still feels right flank pain, 5/10, no nausea vomiting or diarrhea.  No fevers or chills this morning.  Patient denies dizziness, chest pain, shortness of breath, new weakness, numbess, tingling. No acute events overnight.    Objective:   Vitals:   10/05/18 0511 10/05/18 0800 10/05/18 0924 10/05/18 1127  BP: 132/87  118/70   Pulse: 71  70   Resp: 20  16   Temp: 98.4 F (36.9 C)     TempSrc:      SpO2: 98%  97% 97%  Weight:  67 kg    Height:  5' 6"  (1.676 m)      Intake/Output Summary (Last 24 hours) at 10/05/2018 1341 Last data filed at 10/05/2018 1300 Gross per 24 hour  Intake 1176.15 ml  Output 1650 ml  Net -473.85 ml     Wt Readings from Last 3 Encounters:  10/05/18 67 kg  10/04/18 67 kg  08/23/18 66.6 kg     Exam  General: Alert and oriented x 3, NAD  Eyes: PERRLA, EOMI, Anicteric Sclera,  HEENT:  Atraumatic, normocephalic, normal oropharynx  Cardiovascular: S1 S2 auscultated, Regular rate and rhythm.  Respiratory: Clear to auscultation bilaterally, no wheezing, rales or rhonchi  Gastrointestinal: Soft, feels TTP in right flank area, nondistended, NBS  Ext: no pedal edema bilaterally  Neuro: AAOx3, Cr N's II- XII. Strength 5/5 upper and lower extremities bilaterally  Musculoskeletal: No digital cyanosis, clubbing  Skin: No rashes  Psych: Normal affect and demeanor, alert and oriented x3    Data Reviewed:  I have personally reviewed following labs and imaging studies  Micro Results Recent Results (from the past 240 hour(s))  Urine culture     Status: None   Collection Time: 10/04/18 11:26 AM   Specimen: Urine, Clean Catch  Result Value Ref Range Status   Specimen Description   Final    URINE, CLEAN CATCH Performed at [facility] [address],  [place], [place] [address]    Special Requests   Final    NONE Performed at [facility], [place] [address], [place], [place] [address]    Culture   Final    Multiple bacterial morphotypes present, none predominant. Suggest appropriate recollection if clinically indicated.   Report Status 10/05/2018 FINAL  Final  SARS Coronavirus 2 [facility] order, Performed in [facility] hospital lab) Nasopharyngeal Nasopharyngeal Swab     Status: None   Collection Time: 10/04/18  2:47 PM   Specimen: Nasopharyngeal Swab  Result Value Ref Range Status   SARS Coronavirus 2 NEGATIVE NEGATIVE Final    Comment: (NOTE) If result is NEGATIVE SARS-CoV-2 target nucleic acids are NOT DETECTED. The SARS-CoV-2 RNA is generally detectable in upper and lower  respiratory specimens during the acute phase of infection. The lowest  concentration of SARS-CoV-2 viral copies this assay can detect is 250  copies / mL. A negative result does not preclude SARS-CoV-2 infection  and should not be used as the sole basis for treatment or other  patient management decisions.  A negative result may occur with  improper specimen collection / handling, submission of specimen other  than nasopharyngeal swab, presence of viral mutation(s) within the  areas targeted by this assay, and inadequate number of viral copies  (<250 copies / mL). A negative result must be combined with clinical  observations, patient history, and epidemiological information. If result is POSITIVE SARS-CoV-2 target nucleic acids are DETECTED. The SARS-CoV-2 RNA is generally detectable in upper and lower  respiratory specimens dur ing the acute phase of infection.  Positive  results are indicative of active infection with SARS-CoV-2.  Clinical  correlation with patient history and other diagnostic information is  necessary to determine patient infection status.  Positive results do  not rule out bacterial infection or co-infection with other  viruses. If result is PRESUMPTIVE POSTIVE SARS-CoV-2 nucleic acids MAY BE PRESENT.   A presumptive positive result was obtained on the submitted specimen  and confirmed on repeat testing.  While 2019 novel coronavirus  (SARS-CoV-2) nucleic acids may be present in the submitted sample  additional confirmatory testing may be necessary for epidemiological  and / or clinical management purposes  to differentiate between  SARS-CoV-2 and other Sarbecovirus currently known to infect humans.  If clinically indicated additional testing with an alternate test  methodology [phone]) is advised. The SARS-CoV-2 RNA is generally  detectable in upper and lower respiratory sp ecimens during the acute  phase of infection. The expected result is Negative. Fact Sheet for Patients:  StrictlyIdeas.no Fact Sheet for Healthcare Providers: BankingDealers.co.za This test is not yet approved or cleared by the [place] FDA and has been authorized for detection and/or diagnosis of SARS-CoV-2 by FDA under an Emergency Use Authorization (EUA).  This EUA will remain in effect (meaning this test can be used) for the duration of the COVID-19 declaration under Section 564(b)(1) of the Act, 21 U.S.C. section 360bbb-3(b)(1), unless the authorization is terminated or revoked sooner. Performed at [facility], [place] [address] [place], [place] [address]     Radiology Reports Dg Chest 2 View  Result Date: 10/04/2018 CLINICAL DATA:  Cough and wheezing EXAM: CHEST - 2 VIEW COMPARISON:  February 17, 2018 FINDINGS: Port-A-Cath tip is in the superior vena cava. No pneumothorax. No edema or consolidation. Heart size and pulmonary vascularity are normal. No adenopathy. No bone lesions. A stent is noted in the right coronary artery. IMPRESSION: No edema or consolidation. No adenopathy. Port-A-Cath tip in superior vena cava. Electronically Signed   By: Lowella Grip III M.D.   On: 10/04/2018 14:04   Ct Renal Stone Study  Result Date: 10/04/2018 CLINICAL DATA:  Right low back and pelvic pain for 1.5 weeks. Abnormal urinalysis and elevated white blood cell count. Burning with urination. History of lung cancer. EXAM: CT ABDOMEN AND PELVIS WITHOUT CONTRAST TECHNIQUE: Multidetector CT imaging of the abdomen and pelvis was performed following the standard protocol without IV contrast. COMPARISON:  CT abdomen and pelvis  05/11/2018. FINDINGS: Lower chest: Lung bases are clear. No pleural or pericardial effusion. Hepatobiliary: No focal liver abnormality is seen. No gallstones, gallbladder wall thickening, or biliary dilatation. Pancreas: Unremarkable. No pancreatic ductal dilatation or surrounding inflammatory changes. Spleen: Normal in size without focal abnormality. Adrenals/Urinary Tract: Adrenal glands are unremarkable. Kidneys are normal, without renal calculi, focal lesion, or hydronephrosis. Bladder is unremarkable. Stomach/Bowel: Stomach is within normal limits. Appendix appears normal. No evidence of bowel wall thickening, distention, or inflammatory changes. Vascular/Lymphatic: Aortic atherosclerosis. No enlarged abdominal or pelvic lymph nodes. Reproductive: Status post hysterectomy. No adnexal masses. Other: None. Musculoskeletal: No focal lesion. No fracture. Degenerative disc disease lower lumbar spine noted. IMPRESSION: Negative for urinary tract stone. No acute abnormality abdomen or pelvis. Atherosclerosis. Electronically Signed   By: Inge Rise M.D.   On: 10/04/2018 14:00    Lab Data:  CBC: Recent Labs  Lab 10/04/18 0902 10/04/18 1126 10/05/18 0602  WBC 10.8* 11.1* 8.4  NEUTROABS 7.9* 8.1*  --   HGB 11.3* 11.5* 9.3*  HCT 33.3* 34.1* 28.8*  MCV 96.8 99.7 101.4*  PLT 193 201 800   Basic Metabolic Panel: Recent Labs  Lab 10/04/18 0902 10/04/18 1126 10/05/18 0602  NA 141 139 143  K 3.7 3.9 3.6  CL 106 105 113*  CO2 23 24 23     GLUCOSE 160* 93 85  BUN 24* 28* 24*  CREATININE 2.93* 2.81* 2.58*  CALCIUM 9.2 9.0 8.3*   GFR: Estimated Creatinine Clearance: 20.9 mL/min (A) (by C-G formula based on SCr of 2.58 mg/dL (H)). Liver Function Tests: Recent Labs  Lab 10/04/18 0902 10/04/18 1126  AST 24 25  ALT 26 26  ALKPHOS 111 108  BILITOT 0.4 0.8  PROT 7.4 8.1  ALBUMIN 3.8 4.3   Recent Labs  Lab 10/04/18 1126  LIPASE 27   No results for input(s): AMMONIA in the last 168 hours. Coagulation Profile: No results for input(s): INR, PROTIME in the last 168 hours. Cardiac Enzymes: No results for input(s): CKTOTAL, CKMB, CKMBINDEX, TROPONINI in the last 168 hours. BNP (last 3 results) Recent Labs    01/10/18 1630  PROBNP 69   HbA1C: No results for input(s): HGBA1C in the last 72 hours. CBG: No results for input(s): GLUCAP in the last 168 hours. Lipid Profile: No results for input(s): CHOL, HDL, LDLCALC, TRIG, CHOLHDL, LDLDIRECT in the last 72 hours. Thyroid Function Tests: No results for input(s): TSH, T4TOTAL, FREET4, T3FREE, THYROIDAB in the last 72 hours. Anemia Panel: No results for input(s): VITAMINB12, FOLATE, FERRITIN, TIBC, IRON, RETICCTPCT in the last 72 hours. Urine analysis:    Component Value Date/Time   COLORURINE YELLOW 10/04/2018 1126   APPEARANCEUR HAZY (A) 10/04/2018 1126   LABSPEC 1.016 10/04/2018 1126   PHURINE 6.0 10/04/2018 1126   GLUCOSEU NEGATIVE 10/04/2018 1126   HGBUR SMALL (A) 10/04/2018 1126   BILIRUBINUR NEGATIVE 10/04/2018 1126   KETONESUR NEGATIVE 10/04/2018 1126   PROTEINUR 100 (A) 10/04/2018 1126   UROBILINOGEN 0.2 02/06/2009 2322   NITRITE NEGATIVE 10/04/2018 1126   LEUKOCYTESUR MODERATE (A) 10/04/2018 1126     [name] M.D. [facility] 10/05/2018, 1:41 PM  Pager: [phone] Between 7am to 7pm - call Pager - [phone]  After 7pm go to [email or web address] - password [email or web address]  Call night coverage person covering after 7pm

## 2018-10-06 DIAGNOSIS — N10 Acute tubulo-interstitial nephritis: Secondary | ICD-10-CM

## 2018-10-06 LAB — BASIC METABOLIC PANEL
Anion gap: 6 (ref 5–15)
BUN: 22 mg/dL (ref 8–23)
CO2: 23 mmol/L (ref 22–32)
Calcium: 7.9 mg/dL — ABNORMAL LOW (ref 8.9–10.3)
Chloride: 114 mmol/L — ABNORMAL HIGH (ref 98–111)
Creatinine, Ser: 2.64 mg/dL — ABNORMAL HIGH (ref 0.44–1.00)
GFR calc Af Amer: 21 mL/min — ABNORMAL LOW (ref >60)
GFR calc non Af Amer: 19 mL/min — ABNORMAL LOW (ref >60)
Glucose, Bld: 111 mg/dL — ABNORMAL HIGH (ref 70–99)
Potassium: 3.5 mmol/L (ref 3.5–5.1)
Sodium: 143 mmol/L (ref 135–145)

## 2018-10-06 LAB — URINE CULTURE: Culture: NO GROWTH

## 2018-10-06 LAB — CBC
HCT: 25.1 % — ABNORMAL LOW (ref 36.0–46.0)
Hemoglobin: 8.3 g/dL — ABNORMAL LOW (ref 12.0–15.0)
MCH: 33.5 pg (ref 26.0–34.0)
MCHC: 33.1 g/dL (ref 30.0–36.0)
MCV: 101.2 fL — ABNORMAL HIGH (ref 80.0–100.0)
Platelets: 147 10*3/uL — ABNORMAL LOW (ref 150–400)
RBC: 2.48 MIL/uL — ABNORMAL LOW (ref 3.87–5.11)
RDW: 12.8 % (ref 11.5–15.5)
WBC: 6.1 10*3/uL (ref 4.0–10.5)
nRBC: 0 % (ref 0.0–0.2)

## 2018-10-06 NOTE — Progress Notes (Signed)
PROGRESS NOTE  [name] GNO:[id number] DOB: 1955/12/14 DOA: 10/04/2018 PCP: [name], NP   A & P  Acute kidney injury, etiology unclear, could be related to chemotherapy.  Denies NSAIDs, nausea vomiting and diarrhea.  CT was unremarkable, no evidence of hydronephrosis. --Urine output adequate --Creatinine without significant change, 2.64.  BUN has normalized.  Right-sided pyelonephritis, right-sided flank pain, dysuria --Urine culture was unrevealing, CT renal stone study was negative for UTI or any acute abnormality --Continue empiric ceftriaxone  Normocytic anemia, thrombocytopenia --Possibly related to chemotherapy --CBC in a.m.  Intermittent asthma --Stable.  Continue scheduled nebulizers, Singulair, Dulera  Adenocarcinoma left lung --Follow-up with Dr. Earlie Server as an outpatient  Essential hypertension --Amlodipine on hold   Continue IV fluids, check BMP in a.m., if no significant improvement, will check renal ultrasound and consult nephrology   DVT prophylaxis: heparin Code Status: DNR Family Communication: none Disposition Plan: home    Murray Hodgkins, MD  [facility] Direct contact: see [email or web address] (further directions at bottom of note if needed) 7PM-7AM contact night coverage as at bottom of note 10/06/2018, 3:55 PM  LOS: 1 day   Interval History/Subjective  Feels okay today, continues to have right flank pain.  Urinating frequently.  Breathing okay.  No nausea or vomiting.  Objective   Vitals:  Vitals:   10/06/18 0737 10/06/18 1327  BP:  137/74  Pulse: 82 68  Resp: 18 18  Temp:  98.8 F (37.1 C)  SpO2: 98% 98%    Exam:  Constitutional:  . Appears calm and comfortable Respiratory:  . CTA bilaterally, no w/r/r.  . Respiratory effort normal.  Cardiovascular:  . RRR, no m/r/g . No LE extremity edema   Abdomen:  . Soft, nontender, nondistended.  Significant right flank pain when palpated. Skin:  . No rashes, lesions, ulcers  over the right flank. Psychiatric:  . Mental status o Mood, affect appropriate  I have personally reviewed the following:   Today's Data  . Afebrile, vital signs stable, no hypoxia . Multiple voids . BUN slightly improved to 22, creatinine without significant change 2.64.  Hemoglobin slightly lower at 8.3.  Platelets slightly lower 147.  Scheduled Meds: . sodium chloride  250 mL Intravenous Once  . aspirin  81 mg Oral Daily  . buPROPion  150 mg Oral Daily  . DULoxetine  120 mg Oral Daily  . folic acid  1 mg Oral QPM  . heparin  5,000 Units Subcutaneous Q8H  . ipratropium-albuterol  3 mL Nebulization TID  . levothyroxine  25 mcg Oral QAC breakfast  . LORazepam  2 mg Oral QPM  . metoprolol tartrate  12.5 mg Oral BID  . mometasone-formoterol  2 puff Inhalation BID  . montelukast  10 mg Oral QHS  . pantoprazole  40 mg Oral Daily   Continuous Infusions: . sodium chloride 100 mL/hr at 10/05/18 2142  . cefTRIAXone (ROCEPHIN)  IV 1 g (10/06/18 1348)    Principal Problem:   AKI (acute kidney injury) ([place]) Active Problems:   Hyperlipemia   Asthma   SVT (supraventricular tachycardia) ([place])   Lung cancer ([place])   Acute lower UTI   LOS: 1 day   How to contact the [facility] Attending or Consulting provider [place] or covering provider during after hours [place], for this patient?  1. Check the care team in [facility] and look for a) attending/consulting TRH provider listed and b) the [facility] team listed 2. Log into [email or web address] and use [place]'s universal password  to access. If you do not have the password, please contact the hospital operator. 3. Locate the [facility] provider you are looking for under [facility] and page to a number that you can be directly reached. 4. If you still have difficulty reaching the provider, please page the [facility] (Director on Call) for the Hospitalists listed on amion for assistance.

## 2018-10-06 NOTE — TOC Initial Note (Signed)
Transition of Care [facility]) - Initial/Assessment Note    Patient Details  Name: [name] MRN: [id number] Date of Birth: 1955-04-14  Transition of Care [facility]) CM/SW Contact:    Nila Nephew, LCSW Phone Number: [phone] 10/06/2018, 7:12 AM  Clinical Narrative:     Completed high readmission risk screening due to score 31%. Pt of [place] undergoing chemotherapy, admitted for ARF.                    Expected Discharge Plan and Services           Expected Discharge Date: (unknown)                               Activities of Daily Living Home Assistive Devices/Equipment: Eyeglasses, Dentures (specify type)(upper/lower denture) ADL Screening (condition at time of admission) Patient's cognitive ability adequate to safely complete daily activities?: Yes Is the patient deaf or have difficulty hearing?: No Does the patient have difficulty seeing, even when wearing glasses/contacts?: No Does the patient have difficulty concentrating, remembering, or making decisions?: No Patient able to express need for assistance with ADLs?: Yes Does the patient have difficulty dressing or bathing?: No Independently performs ADLs?: Yes (appropriate for developmental age) Does the patient have difficulty walking or climbing stairs?: Yes(secondary to weakness) Weakness of Legs: Both Weakness of Arms/Hands: None  Permission Sought/Granted                  Emotional Assessment              Admission diagnosis:  Pyelonephritis [N12] Acute kidney injury ([place]) [N17.9] Expiratory wheezing [R06.2] AKI (acute kidney injury) ([place]) [N17.9] Patient Active Problem List   Diagnosis Date Noted  . Lung cancer ([place])   . Acute lower UTI   . Diarrhea 06/20/2018  . Tardive dyskinesia 06/20/2018  . Medication side effect 06/20/2018  . Nausea and vomiting 05/11/2018  . AKI (acute kidney injury) ([place]) 05/11/2018  . Port-A-Cath in place 05/02/2018  . Adenocarcinoma of left lung, stage 4  ([place]) 04/05/2018  . Encounter for antineoplastic chemotherapy 04/05/2018  . Encounter for antineoplastic immunotherapy 04/05/2018  . Goals of care, counseling/discussion 04/05/2018  . Tobacco abuse counseling 03/05/2018  . PVC's (premature ventricular contractions)   . SVT (supraventricular tachycardia) ([place])   . Premature atrial contractions   . Unstable angina ([place]) 12/07/2012  . Tachycardia 02/19/2009  . CAD, NATIVE VESSEL 06/04/2008  . Hyperlipemia 05/29/2008  . DEPRESSION 05/29/2008  . Asthma 05/29/2008  . GERD 05/29/2008  . Irritable bowel syndrome 05/29/2008  . DEGENERATIVE JOINT DISEASE, LUMBAR SPINE 05/29/2008  . FIBROMYALGIA 05/29/2008  . CHEST PAIN-PRECORDIAL 05/07/2008   PCP:  [name], NP Pharmacy:   [facility], [place] AT [address] [place] [address] Phone: [phone] Fax: [phone]  [facility], [place] [address] [address] [facility] [place] [address] Phone: [phone] Fax: [phone]     Social Determinants of Health ([place]) Interventions    Readmission Risk Interventions Readmission Risk Prevention Plan 10/06/2018  Transportation Screening Complete  Medication Review (RN Care Manager) Complete  PCP or Specialist appointment within 3-5 days of discharge Complete  HRI or [place] Not Complete  HRI or Home Care Consult Pt Refusal Comments no indication  SW Recovery Care/Counseling Consult Not Complete  SW Consult Not Complete Comments no indication  Palliative Care Screening Not Applicable  Skilled Nursing Facility Not Applicable  Some recent data might be hidden

## 2018-10-07 DIAGNOSIS — T451X5A Adverse effect of antineoplastic and immunosuppressive drugs, initial encounter: Secondary | ICD-10-CM

## 2018-10-07 DIAGNOSIS — D696 Thrombocytopenia, unspecified: Secondary | ICD-10-CM

## 2018-10-07 DIAGNOSIS — D6481 Anemia due to antineoplastic chemotherapy: Secondary | ICD-10-CM

## 2018-10-07 LAB — BASIC METABOLIC PANEL
Anion gap: 6 (ref 5–15)
BUN: 18 mg/dL (ref 8–23)
CO2: 22 mmol/L (ref 22–32)
Calcium: 7.7 mg/dL — ABNORMAL LOW (ref 8.9–10.3)
Chloride: 117 mmol/L — ABNORMAL HIGH (ref 98–111)
Creatinine, Ser: 2.38 mg/dL — ABNORMAL HIGH (ref 0.44–1.00)
GFR calc Af Amer: 24 mL/min — ABNORMAL LOW (ref >60)
GFR calc non Af Amer: 21 mL/min — ABNORMAL LOW (ref >60)
Glucose, Bld: 74 mg/dL (ref 70–99)
Potassium: 3.2 mmol/L — ABNORMAL LOW (ref 3.5–5.1)
Sodium: 145 mmol/L (ref 135–145)

## 2018-10-07 LAB — CBC
HCT: 24.3 % — ABNORMAL LOW (ref 36.0–46.0)
Hemoglobin: 7.8 g/dL — ABNORMAL LOW (ref 12.0–15.0)
MCH: 32.8 pg (ref 26.0–34.0)
MCHC: 32.1 g/dL (ref 30.0–36.0)
MCV: 102.1 fL — ABNORMAL HIGH (ref 80.0–100.0)
Platelets: 127 10*3/uL — ABNORMAL LOW (ref 150–400)
RBC: 2.38 MIL/uL — ABNORMAL LOW (ref 3.87–5.11)
RDW: 12.7 % (ref 11.5–15.5)
WBC: 7.4 10*3/uL (ref 4.0–10.5)
nRBC: 0 % (ref 0.0–0.2)

## 2018-10-07 MED ORDER — LORAZEPAM 1 MG PO TABS
1.0000 mg | ORAL_TABLET | Freq: Once | ORAL | Status: AC
  Administered 2018-10-07: 1 mg via ORAL
  Filled 2018-10-07: qty 1

## 2018-10-07 MED ORDER — POTASSIUM CHLORIDE CRYS ER 20 MEQ PO TBCR
40.0000 meq | EXTENDED_RELEASE_TABLET | Freq: Once | ORAL | Status: AC
  Administered 2018-10-07: 40 meq via ORAL
  Filled 2018-10-07: qty 2

## 2018-10-07 MED ORDER — IPRATROPIUM-ALBUTEROL 0.5-2.5 (3) MG/3ML IN SOLN
3.0000 mL | Freq: Two times a day (BID) | RESPIRATORY_TRACT | Status: DC
  Administered 2018-10-07 – 2018-10-08 (×2): 3 mL via RESPIRATORY_TRACT
  Filled 2018-10-07 (×2): qty 3

## 2018-10-07 NOTE — Care Management Important Message (Signed)
Important Message  Patient Details IM Letter given to Kathrin Greathouse SW to present to the Patient Name: CHANIQUE DUCA MRN: [id number] Date of Birth: Apr 18, 1955   Medicare Important Message Given:  Yes     Kerin Salen 10/07/2018, 10:08 AM

## 2018-10-07 NOTE — Progress Notes (Signed)
PROGRESS NOTE  [name] [id number]:[id number] DOB: [birth year] DOA: 10/04/2018 PCP: [name], NP   A & P  Acute kidney injury, etiology unclear, could be related to chemotherapy.  Denies NSAIDs, nausea vomiting and diarrhea.  CT was unremarkable, no evidence of hydronephrosis. --Urine output.  BUN continues to trend down.  Creatinine now improved today. --Continue IV fluids, repeat BMP in a.m.  Right-sided pyelonephritis, right-sided flank pain, dysuria --Urine culture was unrevealing, CT renal stone study was negative for UTI or any acute abnormality --Afebrile, continue empiric ceftriaxone  Normocytic anemia, thrombocytopenia --Probably secondary to chemotherapy --CBC in a.m.  Intermittent asthma --Remains stable.  Continue scheduled nebulizers, Singulair, Dulera  Adenocarcinoma left lung --Follow-up with Dr. Earlie Server as an outpatient  Essential hypertension --Amlodipine on hold   Renal function starting to improve.  Continue IV fluids.  Check BMP in a.m.  If improved likely discharge home.   DVT prophylaxis: heparin Code Status: DNR Family Communication: none Disposition Plan: home    Murray Hodgkins, MD  [facility] Direct contact: see [email or web address] (further directions at bottom of note if needed) 7PM-7AM contact night coverage as at bottom of note 10/07/2018, 4:46 PM  LOS: 2 days   Interval History/Subjective  Feels about the same today, still having some right-sided flank pain.  Urinating frequently.  Objective   Vitals:  Vitals:   10/07/18 0925 10/07/18 1316  BP: (!) 147/77 140/86  Pulse: 71 66  Resp: 18 20  Temp:  99.1 F (37.3 C)  SpO2: 98% 97%    Exam:  Constitutional:   . Appears calm and comfortable Respiratory:  . CTA bilaterally, no w/r/r.  . Respiratory effort normal.  Cardiovascular:  . RRR, no m/r/g . No LE extremity edema   Psychiatric:  . Mental status o Mood, affect appropriate  I have personally reviewed the  following:   Today's Data  . Urine output 1800 . I/O shows +4 L however the patient has no evidence of volume overload . Potassium 3.2, BUN 18, creatinine now starting to trend down 2.38 . Hemoglobin without significant change, 7.8 . Platelets lower today 127  Scheduled Meds: . sodium chloride  250 mL Intravenous Once  . aspirin  81 mg Oral Daily  . buPROPion  150 mg Oral Daily  . DULoxetine  120 mg Oral Daily  . folic acid  1 mg Oral QPM  . heparin  5,000 Units Subcutaneous Q8H  . ipratropium-albuterol  3 mL Nebulization BID  . levothyroxine  25 mcg Oral QAC breakfast  . LORazepam  2 mg Oral QPM  . metoprolol tartrate  12.5 mg Oral BID  . mometasone-formoterol  2 puff Inhalation BID  . montelukast  10 mg Oral QHS  . pantoprazole  40 mg Oral Daily   Continuous Infusions: . sodium chloride 150 mL/hr at 10/07/18 0938  . cefTRIAXone (ROCEPHIN)  IV 1 g (10/07/18 1312)    Principal Problem:   AKI (acute kidney injury) ([place]) Active Problems:   Hyperlipemia   Asthma   SVT (supraventricular tachycardia) (HCC)   Lung cancer ([place])   Acute lower UTI   Acute pyelonephritis   LOS: 2 days   How to contact the [facility] Attending or Consulting provider [place] or covering provider during after hours [place], for this patient?  1. Check the care team in [facility] and look for a) attending/consulting [facility] provider listed and b) the [facility] team listed 2. Log into [email or web address] and use 's universal password to access. If  you do not have the password, please contact the hospital operator. 3. Locate the [facility] provider you are looking for under [facility] and page to a number that you can be directly reached. 4. If you still have difficulty reaching the provider, please page the [facility] (Director on Call) for the Hospitalists listed on amion for assistance.

## 2018-10-08 LAB — BASIC METABOLIC PANEL
Anion gap: 7 (ref 5–15)
BUN: 14 mg/dL (ref 8–23)
CO2: 19 mmol/L — ABNORMAL LOW (ref 22–32)
Calcium: 7.5 mg/dL — ABNORMAL LOW (ref 8.9–10.3)
Chloride: 116 mmol/L — ABNORMAL HIGH (ref 98–111)
Creatinine, Ser: 2.12 mg/dL — ABNORMAL HIGH (ref 0.44–1.00)
GFR calc Af Amer: 28 mL/min — ABNORMAL LOW (ref >60)
GFR calc non Af Amer: 24 mL/min — ABNORMAL LOW (ref >60)
Glucose, Bld: 77 mg/dL (ref 70–99)
Potassium: 3.2 mmol/L — ABNORMAL LOW (ref 3.5–5.1)
Sodium: 142 mmol/L (ref 135–145)

## 2018-10-08 MED ORDER — CEFUROXIME AXETIL 250 MG PO TABS: 250.0000 mg | ORAL_TABLET | Freq: Two times a day (BID) | ORAL | 0 refills | Status: AC

## 2018-10-08 MED ORDER — POTASSIUM CHLORIDE CRYS ER 20 MEQ PO TBCR
40.0000 meq | EXTENDED_RELEASE_TABLET | Freq: Once | ORAL | Status: AC
  Administered 2018-10-08: 40 meq via ORAL
  Filled 2018-10-08: qty 2

## 2018-10-08 NOTE — Plan of Care (Signed)
Pt going home today with planning complete.

## 2018-10-08 NOTE — Discharge Summary (Addendum)
Physician Discharge Summary  [name] [id number]:[id number] DOB: 01-25-1956 DOA: 10/04/2018  PCP: [name], NP  Admit date: 10/04/2018 Discharge date: 10/08/2018  Recommendations for Outpatient Follow-up:   Acute kidney injury, etiology unclear, could be related to chemotherapy.   --Recheck BMP on follow-up next week already scheduled  Right-sided pyelonephritis, right-sided flank pain, dysuria  Normocytic anemia, thrombocytopenia --Recheck CBC on follow-up next week, already scheduled  Follow-up Information    Curt Bears, MD Follow up on 10/11/2018.   Specialty: Oncology Why: Keep chemotherapy appointment. Contact information: [place] [address] [phone]        [name], NP. Schedule an appointment as soon as possible for a visit in 1 week(s).   Contact information: [place] [address] [phone]        [name], MD .   Specialty: Cardiology Contact information: [place] [place] [address] [phone]            Discharge Diagnoses: Principal diagnosis is #1 1. Acute kidney injury 2. Right-sided pyelonephritis, right-sided flank pain 3. Normocytic anemia, thrombocytopenia 4. Intermittent asthma 5. Adenocarcinoma left lung 6. Essential hypertension  Discharge Condition: improved Disposition: home  Diet recommendation: regular  Filed Weights   10/05/18 0800  Weight: 67 kg    History of present illness:  63 year old woman PMH stage IV small cell lung cancer was referred to the emergency department after seeing her oncologist when she was found to have acute kidney injury.  Admitted for the same and treatment of acute pyelonephritis.  Hospital Course:  Investigation included CT abdomen pelvis which was negative for stone, hydronephrosis or other acute abnormalities.  Kidneys appeared unremarkable.  Treated with empiric antibiotics for  clinical diagnosis of pyelonephritis and aggressive IV fluids with gradual clinical improvement.  BUN has normalized and creatinine trending down.  Excellent urine output expect spontaneous recovery.  Denies NSAIDs.  No nephrotoxins known other than chemotherapy, last dose approximately 6 weeks ago.  Acute kidney injury, etiology unclear, could be related to chemotherapy.  Denies NSAIDs, nausea vomiting and diarrhea.  CT was unremarkable, no evidence of hydronephrosis.  Suspect related to chemotherapy.  Not on any obvious nephrotoxins. --Patient will need to continue aggressive fluids as an outpatient.  She has follow-up next week for chemotherapy at which time her laboratory studies will be rechecked.  Right-sided pyelonephritis, right-sided flank pain, dysuria --Urine culture was unrevealing, CT renal stone study was negative for UTI or any acute abnormality --Remains afebrile, clinically improved on empiric ceftriaxone.  Given her allergy to ciprofloxacin and improvement on ceftriaxone, will discharge on Ceftin.  Normocytic anemia, thrombocytopenia --Anemia probably dilutional effect as well as anemia of chronic disease.  No evidence of bleeding.  Suspect she was hemoconcentrated on admission.  Thrombocytopenia is been seen intermittently in the past, suspect related to infection and malignancy. --She has follow-up next week with Dr. Earlie Server for chemotherapy.  Intermittent asthma --Remained stable.  Continue scheduled nebulizers, Singulair, Dulera  Adenocarcinoma left lung --Follow-up with Dr. Earlie Server as an outpatient  Essential hypertension --stable  Today's assessment: S: feels better, less right flank pain.  Urinating often.  Tolerating diet. O: Vitals:  Vitals:   10/08/18 0724 10/08/18 1007  BP:  (!) 166/97  Pulse:  74  Resp:    Temp:    SpO2: 97%     Constitutional:  . Appears calm and comfortable Respiratory:  . CTA bilaterally, no w/r/r.  . Respiratory effort  normal.  Cardiovascular:  . RRR, no m/r/g . No LE extremity edema   Mild right flank pain.  Overlying skin appears unremarkable. Psychiatric:  . Mental status o Mood, affect appropriate  Urine output 4375. Potassium 3.2, BUN 14, creatinine trending down, 2.12.  Discharge Instructions  Discharge Instructions    Diet general   Complete by: As directed    Discharge instructions   Complete by: As directed    Call your physician or seek immediate medical attention for increased pain, fever, vomiting, decreased urination or worsening of condition.   Increase activity slowly   Complete by: As directed      Allergies as of 10/08/2018      Reactions   Ciprofloxacin Hives   Levofloxacin    UNSPECIFIED REACTION, BUT LIKELY HIVES > HIVES WITH CIPRO   Statins Other (See Comments)   Liver enzymes Liver enzymes   Tricor [fenofibrate] Hives, Itching   Latex Other (See Comments)   Patient states "Messes with my skin."   Codeine Nausea And Vomiting   Metoprolol Other (See Comments)   Does not tolerate beta blockers 10/02/2013-pt states she can tolerate 25 mg and lower      Medication List    TAKE these medications   ProAir HFA 108 (90 Base) MCG/ACT inhaler Generic drug: albuterol Inhale 1 puff into the lungs every 6 (six) hours as needed (wheezing/shortness of breath.).   albuterol (5 MG/ML) 0.5% nebulizer solution Commonly known as: PROVENTIL Take 2.5 mg by nebulization every 6 (six) hours as needed for wheezing.   amLODipine 5 MG tablet Commonly known as: NORVASC Take 1.5 tablets (7.5 mg total) by mouth daily. What changed: when to take this   aspirin 81 MG chewable tablet Chew 1 tablet (81 mg total) by mouth daily.   Belsomra 20 MG Tabs Generic drug: Suvorexant Take 20 mg by mouth at bedtime as needed for sleep.   buPROPion 150 MG 12 hr tablet Commonly known as: WELLBUTRIN SR Take 150 mg by mouth daily.   cefUROXime 250 MG tablet Commonly known as: CEFTIN Take 1  tablet (250 mg total) by mouth 2 (two) times daily for 10 days.   cyanocobalamin 100 MCG tablet Take 1 tablet (100 mcg total) by mouth daily.   DULoxetine 60 MG capsule Commonly known as: CYMBALTA Take 120 mg by mouth daily.   folic acid 1 MG tablet Commonly known as: FOLVITE Take 1 tablet (1 mg total) by mouth daily. What changed: when to take this   HYDROcodone-acetaminophen 7.5-325 MG tablet Commonly known as: NORCO Take 1 tablet by mouth every 6 (six) hours as needed for pain.   ipratropium-albuterol 0.5-2.5 (3) MG/3ML Soln Commonly known as: DUONEB Inhale 3 mLs into the lungs daily.   levothyroxine 25 MCG tablet Commonly known as: SYNTHROID Take 25 mcg by mouth daily before breakfast.   lidocaine-prilocaine cream Commonly known as: EMLA Apply 1 application topically as needed.   loperamide 2 MG capsule Commonly known as: IMODIUM Take 1 capsule (2 mg total) by mouth as needed for diarrhea or loose stools (Initiate if stool C diff -ve).   LORazepam 2 MG tablet Commonly known as: ATIVAN Take 2 mg by mouth every evening.   metoprolol tartrate 25 MG tablet Commonly known as: LOPRESSOR Take 12.5 mg by mouth 2 (two) times daily.   montelukast 10 MG tablet Commonly known as: SINGULAIR Take 10 mg by mouth at bedtime.   nitroGLYCERIN 0.4 MG SL tablet Commonly known as: Nitrostat Place 1 tablet (  0.4 mg total) under the tongue every 5 (five) minutes as needed for chest pain.   omeprazole 40 MG capsule Commonly known as: PRILOSEC Take 1 tablet by mouth twice a day for 2 weeks then 1 tablet every morning thereafter What changed:   how much to take  how to take this  when to take this  additional instructions   ondansetron 4 MG disintegrating tablet Commonly known as: Zofran ODT Take 1 tablet (4 mg total) by mouth every 8 (eight) hours as needed for nausea or vomiting.   ondansetron 8 MG tablet Commonly known as: ZOFRAN Take 1 tablet (8 mg total) by mouth  every 8 (eight) hours as needed for nausea or vomiting.   prochlorperazine 10 MG tablet Commonly known as: COMPAZINE TAKE 1 TABLET(10 MG) BY MOUTH EVERY 6 HOURS AS NEEDED FOR NAUSEA OR VOMITING What changed: See the new instructions.   Symbicort 160-4.5 MCG/ACT inhaler Generic drug: budesonide-formoterol Take 2 puffs by mouth 2 (two) times daily.      Allergies  Allergen Reactions  . Ciprofloxacin Hives  . Levofloxacin     UNSPECIFIED REACTION, BUT LIKELY HIVES > HIVES WITH CIPRO  . Statins Other (See Comments)    Liver enzymes Liver enzymes  . Tricor [Fenofibrate] Hives and Itching  . Latex Other (See Comments)    Patient states "Messes with my skin."  . Codeine Nausea And Vomiting  . Metoprolol Other (See Comments)    Does not tolerate beta blockers 10/02/2013-pt states she can tolerate 25 mg and lower     The results of significant diagnostics from this hospitalization (including imaging, microbiology, ancillary and laboratory) are listed below for reference.    Significant Diagnostic Studies: Dg Chest 2 View  Result Date: 10/04/2018 CLINICAL DATA:  Cough and wheezing EXAM: CHEST - 2 VIEW COMPARISON:  February 17, 2018 FINDINGS: Port-A-Cath tip is in the superior vena cava. No pneumothorax. No edema or consolidation. Heart size and pulmonary vascularity are normal. No adenopathy. No bone lesions. A stent is noted in the right coronary artery. IMPRESSION: No edema or consolidation. No adenopathy. Port-A-Cath tip in superior vena cava. Electronically Signed   By: Lowella Grip III M.D.   On: 10/04/2018 14:04   Ct Renal Stone Study  Result Date: 10/04/2018 CLINICAL DATA:  Right low back and pelvic pain for 1.5 weeks. Abnormal urinalysis and elevated white blood cell count. Burning with urination. History of lung cancer. EXAM: CT ABDOMEN AND PELVIS WITHOUT CONTRAST TECHNIQUE: Multidetector CT imaging of the abdomen and pelvis was performed following the standard protocol  without IV contrast. COMPARISON:  CT abdomen and pelvis 05/11/2018. FINDINGS: Lower chest: Lung bases are clear. No pleural or pericardial effusion. Hepatobiliary: No focal liver abnormality is seen. No gallstones, gallbladder wall thickening, or biliary dilatation. Pancreas: Unremarkable. No pancreatic ductal dilatation or surrounding inflammatory changes. Spleen: Normal in size without focal abnormality. Adrenals/Urinary Tract: Adrenal glands are unremarkable. Kidneys are normal, without renal calculi, focal lesion, or hydronephrosis. Bladder is unremarkable. Stomach/Bowel: Stomach is within normal limits. Appendix appears normal. No evidence of bowel wall thickening, distention, or inflammatory changes. Vascular/Lymphatic: Aortic atherosclerosis. No enlarged abdominal or pelvic lymph nodes. Reproductive: Status post hysterectomy. No adnexal masses. Other: None. Musculoskeletal: No focal lesion. No fracture. Degenerative disc disease lower lumbar spine noted. IMPRESSION: Negative for urinary tract stone. No acute abnormality abdomen or pelvis. Atherosclerosis. Electronically Signed   By: Inge Rise M.D.   On: 10/04/2018 14:00    Microbiology: Recent Results (from the  past 240 hour(s))  Urine culture     Status: None   Collection Time: 10/04/18 11:26 AM   Specimen: Urine, Clean Catch  Result Value Ref Range Status   Specimen Description   Final    URINE, CLEAN CATCH Performed at [facility], [place] [address], [place], [place] [address]    Special Requests   Final    NONE Performed at [facility], [place] [address] [place], [place] [address]    Culture   Final    Multiple bacterial morphotypes present, none predominant. Suggest appropriate recollection if clinically indicated.   Report Status 10/05/2018 FINAL  Final  SARS Coronavirus 2 [facility] order, Performed in [facility] hospital lab) Nasopharyngeal Nasopharyngeal Swab     Status: None   Collection  Time: 10/04/18  2:47 PM   Specimen: Nasopharyngeal Swab  Result Value Ref Range Status   SARS Coronavirus 2 NEGATIVE NEGATIVE Final    Comment: (NOTE) If result is NEGATIVE SARS-CoV-2 target nucleic acids are NOT DETECTED. The SARS-CoV-2 RNA is generally detectable in upper and lower  respiratory specimens during the acute phase of infection. The lowest  concentration of SARS-CoV-2 viral copies this assay can detect is 250  copies / mL. A negative result does not preclude SARS-CoV-2 infection  and should not be used as the sole basis for treatment or other  patient management decisions.  A negative result may occur with  improper specimen collection / handling, submission of specimen other  than nasopharyngeal swab, presence of viral mutation(s) within the  areas targeted by this assay, and inadequate number of viral copies  (<250 copies / mL). A negative result must be combined with clinical  observations, patient history, and epidemiological information. If result is POSITIVE SARS-CoV-2 target nucleic acids are DETECTED. The SARS-CoV-2 RNA is generally detectable in upper and lower  respiratory specimens dur ing the acute phase of infection.  Positive  results are indicative of active infection with SARS-CoV-2.  Clinical  correlation with patient history and other diagnostic information is  necessary to determine patient infection status.  Positive results do  not rule out bacterial infection or co-infection with other viruses. If result is PRESUMPTIVE POSTIVE SARS-CoV-2 nucleic acids MAY BE PRESENT.   A presumptive positive result was obtained on the submitted specimen  and confirmed on repeat testing.  While 2019 novel coronavirus  (SARS-CoV-2) nucleic acids may be present in the submitted sample  additional confirmatory testing may be necessary for epidemiological  and / or clinical management purposes  to differentiate between  SARS-CoV-2 and other Sarbecovirus currently known  to infect humans.  If clinically indicated additional testing with an alternate test  methodology [phone]) is advised. The SARS-CoV-2 RNA is generally  detectable in upper and lower respiratory sp ecimens during the acute  phase of infection. The expected result is Negative. Fact Sheet for Patients:  StrictlyIdeas.no Fact Sheet for Healthcare Providers: BankingDealers.co.za This test is not yet approved or cleared by the [place] FDA and has been authorized for detection and/or diagnosis of SARS-CoV-2 by FDA under an Emergency Use Authorization (EUA).  This EUA will remain in effect (meaning this test can be used) for the duration of the COVID-19 declaration under Section 564(b)(1) of the Act, 21 U.S.C. section 360bbb-3(b)(1), unless the authorization is terminated or revoked sooner. Performed at [facility], [place] [address], [place], [place] [address]   Urine Culture     Status: None   Collection Time: 10/05/18  2:19 PM   Specimen: Urine, Clean Catch  Result Value Ref Range Status   Specimen Description   Final    URINE, CLEAN CATCH Performed at [facility], [place] [address] [place], [place] [address]    Special Requests   Final    NONE Performed at [facility], [place] [address] [place], [place] [address]    Culture   Final    NO GROWTH Performed at [facility], [place] [address] [place], [place] [address]    Report Status 10/06/2018 FINAL  Final     Labs: Basic Metabolic Panel: Recent Labs  Lab 10/04/18 1126 10/05/18 0602 10/06/18 1021 10/07/18 0310 10/08/18 0333  NA 139 143 143 145 142  K 3.9 3.6 3.5 3.2* 3.2*  CL 105 113* 114* 117* 116*  CO2 24 23 23 22  19*  GLUCOSE 93 85 111* 74 77  BUN 28* 24* 22 18 14   CREATININE 2.81* 2.58* 2.64* 2.38* 2.12*  CALCIUM 9.0 8.3* 7.9* 7.7* 7.5*   Liver Function Tests: Recent Labs  Lab 10/04/18 0902 10/04/18  1126  AST 24 25  ALT 26 26  ALKPHOS 111 108  BILITOT 0.4 0.8  PROT 7.4 8.1  ALBUMIN 3.8 4.3   Recent Labs  Lab 10/04/18 1126  LIPASE 27   CBC: Recent Labs  Lab 10/04/18 0902 10/04/18 1126 10/05/18 0602 10/06/18 1021 10/07/18 0310  WBC 10.8* 11.1* 8.4 6.1 7.4  NEUTROABS 7.9* 8.1*  --   --   --   HGB 11.3* 11.5* 9.3* 8.3* 7.8*  HCT 33.3* 34.1* 28.8* 25.1* 24.3*  MCV 96.8 99.7 101.4* 101.2* 102.1*  PLT 193 201 153 147* 127*   Principal Problem:   AKI (acute kidney injury) ([place]) Active Problems:   Hyperlipemia   Asthma   SVT (supraventricular tachycardia) (HCC)   Lung cancer (HCC)   Acute lower UTI   Acute pyelonephritis   Anemia associated with chemotherapy   Thrombocytopenia ([place])   Time coordinating discharge: 35 minutes  Signed:  Murray Hodgkins, MD  [facility]  10/08/2018, 1:43 PM

## 2018-10-08 NOTE — Progress Notes (Signed)
Pt going home this afternoon. Discharge instructions given/explained with pt verbalizing understanding. Pt aware of follow-up appointments.

## 2018-10-11 ENCOUNTER — Encounter: Payer: Self-pay | Admitting: Physician Assistant

## 2018-10-11 ENCOUNTER — Inpatient Hospital Stay: Payer: Managed Care, Other (non HMO)

## 2018-10-11 ENCOUNTER — Inpatient Hospital Stay (HOSPITAL_BASED_OUTPATIENT_CLINIC_OR_DEPARTMENT_OTHER): Payer: Managed Care, Other (non HMO) | Admitting: Physician Assistant

## 2018-10-11 ENCOUNTER — Other Ambulatory Visit: Payer: Self-pay

## 2018-10-11 VITALS — BP 145/90 | HR 76 | Resp 18

## 2018-10-11 VITALS — BP 131/93 | HR 112 | Temp 97.8°F | Resp 18 | Ht 66.0 in | Wt 150.1 lb

## 2018-10-11 DIAGNOSIS — Z95828 Presence of other vascular implants and grafts: Secondary | ICD-10-CM

## 2018-10-11 DIAGNOSIS — J449 Chronic obstructive pulmonary disease, unspecified: Secondary | ICD-10-CM | POA: Diagnosis not present

## 2018-10-11 DIAGNOSIS — M1990 Unspecified osteoarthritis, unspecified site: Secondary | ICD-10-CM | POA: Diagnosis not present

## 2018-10-11 DIAGNOSIS — C3492 Malignant neoplasm of unspecified part of left bronchus or lung: Secondary | ICD-10-CM

## 2018-10-11 DIAGNOSIS — E785 Hyperlipidemia, unspecified: Secondary | ICD-10-CM | POA: Diagnosis not present

## 2018-10-11 DIAGNOSIS — F419 Anxiety disorder, unspecified: Secondary | ICD-10-CM | POA: Diagnosis not present

## 2018-10-11 DIAGNOSIS — R110 Nausea: Secondary | ICD-10-CM | POA: Diagnosis not present

## 2018-10-11 DIAGNOSIS — Z79899 Other long term (current) drug therapy: Secondary | ICD-10-CM | POA: Diagnosis not present

## 2018-10-11 DIAGNOSIS — E039 Hypothyroidism, unspecified: Secondary | ICD-10-CM | POA: Diagnosis not present

## 2018-10-11 DIAGNOSIS — R5383 Other fatigue: Secondary | ICD-10-CM | POA: Diagnosis not present

## 2018-10-11 DIAGNOSIS — N179 Acute kidney failure, unspecified: Secondary | ICD-10-CM | POA: Diagnosis not present

## 2018-10-11 DIAGNOSIS — Z9221 Personal history of antineoplastic chemotherapy: Secondary | ICD-10-CM | POA: Diagnosis not present

## 2018-10-11 DIAGNOSIS — I10 Essential (primary) hypertension: Secondary | ICD-10-CM | POA: Diagnosis not present

## 2018-10-11 DIAGNOSIS — R197 Diarrhea, unspecified: Secondary | ICD-10-CM | POA: Diagnosis not present

## 2018-10-11 DIAGNOSIS — Z7951 Long term (current) use of inhaled steroids: Secondary | ICD-10-CM | POA: Diagnosis not present

## 2018-10-11 DIAGNOSIS — Z7982 Long term (current) use of aspirin: Secondary | ICD-10-CM | POA: Diagnosis not present

## 2018-10-11 DIAGNOSIS — M797 Fibromyalgia: Secondary | ICD-10-CM | POA: Diagnosis not present

## 2018-10-11 DIAGNOSIS — Z9225 Personal history of immunosupression therapy: Secondary | ICD-10-CM | POA: Diagnosis not present

## 2018-10-11 DIAGNOSIS — C3491 Malignant neoplasm of unspecified part of right bronchus or lung: Secondary | ICD-10-CM | POA: Diagnosis present

## 2018-10-11 DIAGNOSIS — I2510 Atherosclerotic heart disease of native coronary artery without angina pectoris: Secondary | ICD-10-CM | POA: Diagnosis not present

## 2018-10-11 DIAGNOSIS — R7989 Other specified abnormal findings of blood chemistry: Secondary | ICD-10-CM | POA: Diagnosis not present

## 2018-10-11 DIAGNOSIS — K219 Gastro-esophageal reflux disease without esophagitis: Secondary | ICD-10-CM | POA: Diagnosis not present

## 2018-10-11 DIAGNOSIS — D6481 Anemia due to antineoplastic chemotherapy: Secondary | ICD-10-CM

## 2018-10-11 DIAGNOSIS — R350 Frequency of micturition: Secondary | ICD-10-CM | POA: Diagnosis not present

## 2018-10-11 LAB — CBC WITH DIFFERENTIAL (CANCER CENTER ONLY)
Abs Immature Granulocytes: 0.04 10*3/uL (ref 0.00–0.07)
Basophils Absolute: 0.1 10*3/uL (ref 0.0–0.1)
Basophils Relative: 1 %
Eosinophils Absolute: 0.5 10*3/uL (ref 0.0–0.5)
Eosinophils Relative: 5 %
HCT: 30.2 % — ABNORMAL LOW (ref 36.0–46.0)
Hemoglobin: 10.1 g/dL — ABNORMAL LOW (ref 12.0–15.0)
Immature Granulocytes: 0 %
Lymphocytes Relative: 16 %
Lymphs Abs: 1.5 10*3/uL (ref 0.7–4.0)
MCH: 33 pg (ref 26.0–34.0)
MCHC: 33.4 g/dL (ref 30.0–36.0)
MCV: 98.7 fL (ref 80.0–100.0)
Monocytes Absolute: 0.8 10*3/uL (ref 0.1–1.0)
Monocytes Relative: 8 %
Neutro Abs: 6.4 10*3/uL (ref 1.7–7.7)
Neutrophils Relative %: 70 %
Platelet Count: 140 10*3/uL — ABNORMAL LOW (ref 150–400)
RBC: 3.06 MIL/uL — ABNORMAL LOW (ref 3.87–5.11)
RDW: 13.2 % (ref 11.5–15.5)
WBC Count: 9.2 10*3/uL (ref 4.0–10.5)
nRBC: 0 % (ref 0.0–0.2)

## 2018-10-11 LAB — CMP (CANCER CENTER ONLY)
ALT: 19 U/L (ref 0–44)
AST: 24 U/L (ref 15–41)
Albumin: 4.1 g/dL (ref 3.5–5.0)
Alkaline Phosphatase: 81 U/L (ref 38–126)
Anion gap: 9 (ref 5–15)
BUN: 18 mg/dL (ref 8–23)
CO2: 23 mmol/L (ref 22–32)
Calcium: 8.9 mg/dL (ref 8.9–10.3)
Chloride: 108 mmol/L (ref 98–111)
Creatinine: 2.61 mg/dL — ABNORMAL HIGH (ref 0.44–1.00)
GFR, Est AFR Am: 22 mL/min — ABNORMAL LOW (ref >60)
GFR, Estimated: 19 mL/min — ABNORMAL LOW (ref >60)
Glucose, Bld: 132 mg/dL — ABNORMAL HIGH (ref 70–99)
Potassium: 3.8 mmol/L (ref 3.5–5.1)
Sodium: 140 mmol/L (ref 135–145)
Total Bilirubin: 0.6 mg/dL (ref 0.3–1.2)
Total Protein: 7.5 g/dL (ref 6.5–8.1)

## 2018-10-11 MED ORDER — SODIUM CHLORIDE 0.9 % IV SOLN
Freq: Once | INTRAVENOUS | Status: DC
  Filled 2018-10-11: qty 250

## 2018-10-11 MED ORDER — SODIUM CHLORIDE 0.9 % IV SOLN
Freq: Once | INTRAVENOUS | Status: AC
  Administered 2018-10-11: 15:00:00 via INTRAVENOUS
  Filled 2018-10-11: qty 250

## 2018-10-11 MED ORDER — SODIUM CHLORIDE 0.9% FLUSH
10.0000 mL | Freq: Once | INTRAVENOUS | Status: AC
  Administered 2018-10-11: 10 mL
  Filled 2018-10-11: qty 10

## 2018-10-11 MED ORDER — HEPARIN SOD (PORK) LOCK FLUSH 100 UNIT/ML IV SOLN
500.0000 [IU] | Freq: Once | INTRAVENOUS | Status: AC
  Administered 2018-10-11: 500 [IU]
  Filled 2018-10-11: qty 5

## 2018-10-11 NOTE — Patient Instructions (Signed)
Coronavirus (COVID-19) Are you at risk?  Are you at risk for the Coronavirus (COVID-19)?  To be considered HIGH RISK for Coronavirus (COVID-19), you have to meet the following criteria:  . Traveled to [place], [place], [place], [place] or [place]; or in the [place] to [place], [place], [place], or [place]; and have fever, cough, and shortness of breath within the last 2 weeks of travel OR . Been in close contact with a person diagnosed with COVID-19 within the last 2 weeks and have fever, cough, and shortness of breath . IF YOU DO NOT MEET THESE CRITERIA, YOU ARE CONSIDERED LOW RISK FOR COVID-19.  What to do if you are HIGH RISK for COVID-19?  Marland Kitchen If you are having a medical emergency, call 911. . Seek medical care right away. Before you go to a doctor's office, urgent care or emergency department, call ahead and tell them about your recent travel, contact with someone diagnosed with COVID-19, and your symptoms. You should receive instructions from your physician's office regarding next steps of care.  . When you arrive at healthcare provider, tell the healthcare staff immediately you have returned from visiting [place], [place], [place], [place] or [place]; or traveled in the [place] to [place], [place], [place], or [place]; in the last two weeks or you have been in close contact with a person diagnosed with COVID-19 in the last 2 weeks.   . Tell the health care staff about your symptoms: fever, cough and shortness of breath. . After you have been seen by a medical provider, you will be either: o Tested for (COVID-19) and discharged home on quarantine except to seek medical care if symptoms worsen, and asked to  - Stay home and avoid contact with others until you get your results (4-5 days)  - Avoid travel on public transportation if possible (such as bus, train, or airplane) or o Sent to the Emergency Department by EMS for evaluation, COVID-19 testing, and possible  admission depending on your condition and test results.  What to do if you are LOW RISK for COVID-19?  Reduce your risk of any infection by using the same precautions used for avoiding the common cold or flu:  Marland Kitchen Wash your hands often with soap and warm water for at least 20 seconds.  If soap and water are not readily available, use an alcohol-based hand sanitizer with at least 60% alcohol.  . If coughing or sneezing, cover your mouth and nose by coughing or sneezing into the elbow areas of your shirt or coat, into a tissue or into your sleeve (not your hands). . Avoid shaking hands with others and consider head nods or verbal greetings only. . Avoid touching your eyes, nose, or mouth with unwashed hands.  . Avoid close contact with people who are sick. . Avoid places or events with large numbers of people in one location, like concerts or sporting events. . Carefully consider travel plans you have or are making. . If you are planning any travel outside or inside the [place], visit the CDC's Travelers' Health webpage for the latest health notices. . If you have some symptoms but not all symptoms, continue to monitor at home and seek medical attention if your symptoms worsen. . If you are having a medical emergency, call 911.   [place] / e-Visit: [email or web address]         [facility]: [facility]: [facility]: [phone]    Dehydration, Adult  Dehydration is a condition in which there is not enough fluid or water in the body. This happens when you lose more fluids than you take in. Important organs, such as the kidneys, brain, and heart, cannot function without a proper amount of fluids. Any loss of fluids from the body can lead to dehydration. Dehydration can range from mild to severe. This condition  should be treated right away to prevent it from becoming severe. What are the causes? This condition may be caused by:  Vomiting.  Diarrhea.  Excessive sweating, such as from heat exposure or exercise.  Not drinking enough fluid, especially: ? When ill. ? While doing activity that requires a lot of energy.  Excessive urination.  Fever.  Infection.  Certain medicines, such as medicines that cause the body to lose excess fluid (diuretics).  Inability to access safe drinking water.  Reduced physical ability to get adequate water and food. What increases the risk? This condition is more likely to develop in people:  Who have a poorly controlled long-term (chronic) illness, such as diabetes, heart disease, or kidney disease.  Who are age 64 or older.  Who are disabled.  Who live in a place with high altitude.  Who play endurance sports. What are the signs or symptoms? Symptoms of mild dehydration may include:  Thirst.  Dry lips.  Slightly dry mouth.  Dry, warm skin.  Dizziness. Symptoms of moderate dehydration may include:  Very dry mouth.  Muscle cramps.  Dark urine. Urine may be the color of tea.  Decreased urine production.  Decreased tear production.  Heartbeat that is irregular or faster than normal (palpitations).  Headache.  Light-headedness, especially when you stand up from a sitting position.  Fainting (syncope). Symptoms of severe dehydration may include:  Changes in skin, such as: ? Cold and clammy skin. ? Blotchy (mottled) or pale skin. ? Skin that does not quickly return to normal after being lightly pinched and released (poor skin turgor).  Changes in body fluids, such as: ? Extreme thirst. ? No tear production. ? Inability to sweat when body temperature is high, such as in hot weather. ? Very little urine production.  Changes in vital signs, such as: ? Weak pulse. ? Pulse that is more than 100 beats a minute when sitting  still. ? Rapid breathing. ? Low blood pressure.  Other changes, such as: ? Sunken eyes. ? Cold hands and feet. ? Confusion. ? Lack of energy (lethargy). ? Difficulty waking up from sleep. ? Short-term weight loss. ? Unconsciousness. How is this diagnosed? This condition is diagnosed based on your symptoms and a physical exam. Blood and urine tests may be done to help confirm the diagnosis. How is this treated? Treatment for this condition depends on the severity. Mild or moderate dehydration can often be treated at home. Treatment should be started right away. Do not wait until dehydration becomes severe. Severe dehydration is an emergency and it needs to be treated in a hospital. Treatment for mild dehydration may include:  Drinking more fluids.  Replacing salts and minerals in your blood (electrolytes) that you may have lost. Treatment for moderate dehydration may include:  Drinking an oral rehydration solution (ORS). This is a drink that helps you replace fluids and electrolytes (rehydrate). It can be found at pharmacies and retail  stores. Treatment for severe dehydration may include:  Receiving fluids through an IV tube.  Receiving an electrolyte solution through a feeding tube that is passed through your nose and into your stomach (nasogastric tube, or NG tube).  Correcting any abnormalities in electrolytes.  Treating the underlying cause of dehydration. Follow these instructions at home:  If directed by your health care provider, drink an ORS: ? Make an ORS by following instructions on the package. ? Start by drinking small amounts, about  cup (120 mL) every 5-10 minutes. ? Slowly increase how much you drink until you have taken the amount recommended by your health care provider.  Drink enough clear fluid to keep your urine clear or pale yellow. If you were told to drink an ORS, finish the ORS first, then start slowly drinking other clear fluids. Drink fluids such as:  ? Water. Do not drink only water. Doing that can lead to having too little salt (sodium) in the body (hyponatremia). ? Ice chips. ? Fruit juice that you have added water to (diluted fruit juice). ? Low-calorie sports drinks.  Avoid: ? Alcohol. ? Drinks that contain a lot of sugar. These include high-calorie sports drinks, fruit juice that is not diluted, and soda. ? Caffeine. ? Foods that are greasy or contain a lot of fat or sugar.  Take over-the-counter and prescription medicines only as told by your health care provider.  Do not take sodium tablets. This can lead to having too much sodium in the body (hypernatremia).  Eat foods that contain a healthy balance of electrolytes, such as bananas, oranges, potatoes, tomatoes, and spinach.  Keep all follow-up visits as told by your health care provider. This is important. Contact a health care provider if:  You have abdominal pain that: ? Gets worse. ? Stays in one area (localizes).  You have a rash.  You have a stiff neck.  You are more irritable than usual.  You are sleepier or more difficult to wake up than usual.  You feel weak or dizzy.  You feel very thirsty.  You have urinated only a small amount of very dark urine over 6-8 hours. Get help right away if:  You have symptoms of severe dehydration.  You cannot drink fluids without vomiting.  Your symptoms get worse with treatment.  You have a fever.  You have a severe headache.  You have vomiting or diarrhea that: ? Gets worse. ? Does not go away.  You have blood or green matter (bile) in your vomit.  You have blood in your stool. This may cause stool to look black and tarry.  You have not urinated in 6-8 hours.  You faint.  Your heart rate while sitting still is over 100 beats a minute.  You have trouble breathing. This information is not intended to replace advice given to you by your health care provider. Make sure you discuss any questions you have  with your health care provider. Document Released: 02/16/2005 Document Revised: 01/29/2017 Document Reviewed: 04/12/2015 Elsevier Patient Education  2020 [employer].

## 2018-10-11 NOTE — Progress Notes (Signed)
[facility] OFFICE PROGRESS NOTE  [name], NP [place] [address]  DIAGNOSIS: DIAGNOSIS: Stage IV (T1a, N0, M1 a) non-small cell lung cancer, adenocarcinoma presented with multifocal disease in both lungs including 3 nodules in the left lung as well as 2 nodules in the right lung diagnosed in December 2019. No actionable mutations KRAS G12C BCOR N1425S-subclonal RAD21 amplification  PDL 1 expression 1%.  PRIOR THERAPY:  1) Systemic chemotherapy with carboplatin for AUC of 5, Alimta 500 mg/M2 and Keytruda 200 mg IV every 3 weeks. First dose April 12, 2018. Status post 4 cycles. tarting from cycle #3, [name] was dropped and patient received treatment with Carboplatin for an AUC of 5 and Alimta 500 mg/m2 IV every 3 weeks.  CURRENT THERAPY: Systemic chemotherapy with Alimta 500 mg/m2 IV every 3 weeks. Status post 3 cycles of single agent Alimta. Most recent dose given on 08/23/2018  INTERVAL HISTORY: [name] 63 y.o. female returns to the clinic for a follow-up visit.  The patient has not received chemotherapy since 08/23/2018 due to the patient canceling her July appointments due to having conjunctivitis. The patient then presented to the clinic on 10/04/2018 for cycle #4 of single agent Alimta but was found to have an acute kidney injury on routine labs. She did not receive chemotherapy that day. She also had complained of dysuria and right flank pain that day. She was subsequently sent to the ER where a urine/culture was performed as well as a CT stone study. She was treated in the hospital from 10/04/2018-10/08/2018 for a UTI/pyelonephritis with antibiotics and fluid hydration. She was treated with cetriaxone while admitted and was discharged with a prescription for ceftin. Since her hospitalization, the patient's symptoms of dysuria have resolved. She denies ibuprofen use or nephrotoxins.  She denies any associated fevers, painful urination,  lower extremity edema, or hematuria today. Still endorses "bubbly urine", generalized weakness, fatigue, and occasional nausea while seen in the clinic today. States that she tries to stay hydrated. She denies any recent episodes of diarrhea.   The patient denies any weight loss or night sweats.  Denies any chest pain, shortness of breath, cough, hemoptysis.  She denies any headache or visual changes.  She denies any vomiting, diarrhea, or constipation.  She is here today for evaluation and repeat lab work before starting cycle # 4 of single agent Alimta.   MEDICAL HISTORY: Past Medical History:  Diagnosis Date  . Anemia    "long time ago" (12/06/2012)  . Anxiety   . Arthritis    "right knee real bad; in my hands bad" (12/06/2012)  . Asthma   . Celiac disease   . Colon polyps   . COPD (chronic obstructive pulmonary disease) ([facility])   . Coronary artery disease    a. s/p Xience DES to [facility] 04/2008;  b. LHC 05/2008: Proximal RCA 25%, mid RCA stent patent.  c. Anormal nuc 2014 -> s/p LHC with severe mRCA stenosis s/p DES. d. Cath 04/2013 s/p DES to LAD.  e. LHC 08/2015 was stable.  . Depression    "years ago" (12/06/2012)  . Fibromyalgia   . Gallstones   . GERD (gastroesophageal reflux disease)   . H/O hiatal hernia   . Hepatitis    "not A, B, or C" (12/06/2012)  . Hyperlipidemia    intol to statins and other chol agents due to elevated LFTs  . Hypertension   . Hypothyroidism   . IBS (irritable bowel syndrome)   .  Interstitial cystitis   . Lung cancer ([place]) dx'd 01/2018  . Lung nodules    Bilateral  . Migraines    "when I was younger" (12/06/2012)  . Pneumonia   . PONV (postoperative nausea and vomiting)    "Very bad"  . Premature atrial contractions   . PVC's (premature ventricular contractions)   . Sinus headache   . SVT (supraventricular tachycardia) ([facility])     ALLERGIES:  is allergic to ciprofloxacin; levofloxacin; statins; tricor [fenofibrate]; compazine [prochlorperazine]; latex;  codeine; and metoprolol.  MEDICATIONS:  Current Outpatient Medications  Medication Sig Dispense Refill  . albuterol (PROAIR HFA) 108 (90 Base) MCG/ACT inhaler Inhale 1 puff into the lungs every 6 (six) hours as needed (wheezing/shortness of breath.).     Marland Kitchen albuterol (PROVENTIL) (5 MG/ML) 0.5% nebulizer solution Take 2.5 mg by nebulization every 6 (six) hours as needed for wheezing.    Marland Kitchen aspirin 81 MG chewable tablet Chew 1 tablet (81 mg total) by mouth daily.    Marland Kitchen buPROPion (WELLBUTRIN SR) 150 MG 12 hr tablet Take 150 mg by mouth daily.     . cefUROXime (CEFTIN) 250 MG tablet Take 1 tablet (250 mg total) by mouth 2 (two) times daily for 10 days. 20 tablet 0  . DULoxetine (CYMBALTA) 60 MG capsule Take 120 mg by mouth daily.    . folic acid (FOLVITE) 1 MG tablet Take 1 tablet (1 mg total) by mouth daily. (Patient taking differently: Take 1 mg by mouth every evening. ) 30 tablet 4  . HYDROcodone-acetaminophen (NORCO) 7.5-325 MG per tablet Take 1 tablet by mouth every 6 (six) hours as needed for pain.    Marland Kitchen ipratropium-albuterol (DUONEB) 0.5-2.5 (3) MG/3ML SOLN Inhale 3 mLs into the lungs daily.    Marland Kitchen levothyroxine (SYNTHROID, LEVOTHROID) 25 MCG tablet Take 25 mcg by mouth daily before breakfast.    . lidocaine-prilocaine (EMLA) cream Apply 1 application topically as needed. 30 g 0  . LORazepam (ATIVAN) 2 MG tablet Take 2 mg by mouth every evening.     . metoprolol tartrate (LOPRESSOR) 25 MG tablet Take 12.5 mg by mouth 2 (two) times daily.     . montelukast (SINGULAIR) 10 MG tablet Take 10 mg by mouth at bedtime.     Marland Kitchen omeprazole (PRILOSEC) 40 MG capsule Take 1 tablet by mouth twice a day for 2 weeks then 1 tablet every morning thereafter (Patient taking differently: Take 40 mg by mouth daily. ) 45 capsule 8  . ondansetron (ZOFRAN ODT) 4 MG disintegrating tablet Take 1 tablet (4 mg total) by mouth every 8 (eight) hours as needed for nausea or vomiting. 20 tablet 0  . SYMBICORT 160-4.5 MCG/ACT inhaler  Take 2 puffs by mouth 2 (two) times daily.  3  . amLODipine (NORVASC) 5 MG tablet Take 1.5 tablets (7.5 mg total) by mouth daily. (Patient taking differently: Take 7.5 mg by mouth every evening. ) 180 tablet 3  . BELSOMRA 20 MG TABS Take 20 mg by mouth at bedtime as needed for sleep.    Marland Kitchen loperamide (IMODIUM) 2 MG capsule Take 1 capsule (2 mg total) by mouth as needed for diarrhea or loose stools (Initiate if stool C diff -ve). (Patient not taking: Reported on 10/11/2018) 30 capsule 0  . nitroGLYCERIN (NITROSTAT) 0.4 MG SL tablet Place 1 tablet (0.4 mg total) under the tongue every 5 (five) minutes as needed for chest pain. (Patient not taking: Reported on 10/04/2018) 25 tablet 12  . ondansetron (ZOFRAN) 8 MG tablet  Take 1 tablet (8 mg total) by mouth every 8 (eight) hours as needed for nausea or vomiting. (Patient not taking: Reported on 10/11/2018) 30 tablet 0  . vitamin B-12 100 MCG tablet Take 1 tablet (100 mcg total) by mouth daily. (Patient not taking: Reported on 10/04/2018) 30 tablet 1   No current facility-administered medications for this visit.     SURGICAL HISTORY:  Past Surgical History:  Procedure Laterality Date  . CARDIAC CATHETERIZATION  04/2008; 05/2008  . CARDIAC CATHETERIZATION N/A 08/07/2015   Procedure: Left Heart Cath and Coronary Angiography;  Surgeon: Sherren Mocha, MD;  Location: [facility];  Service: Cardiovascular;  Laterality: N/A;  . CHOLECYSTECTOMY    . CORONARY ANGIOPLASTY WITH STENT PLACEMENT  04/2008 12/06/2012   "1 + 1" (12/06/2012)  . CORONARY STENT PLACEMENT  05/08/2013   DES TO LAD      DR MCALHANY  . IR IMAGING GUIDED PORT INSERTION  04/29/2018  . LEFT HEART CATHETERIZATION WITH CORONARY ANGIOGRAM N/A 12/06/2012   Procedure: LEFT HEART CATHETERIZATION WITH CORONARY ANGIOGRAM;  Surgeon: [name], MD;  Location: [facility] CATH LAB;  Service: Cardiovascular;  Laterality: N/A;  . LEFT HEART CATHETERIZATION WITH CORONARY ANGIOGRAM N/A 05/08/2013   Procedure:  LEFT HEART CATHETERIZATION WITH CORONARY ANGIOGRAM;  Surgeon: [name], MD;  Location: [facility] CATH LAB;  Service: Cardiovascular;  Laterality: N/A;  . LEFT HEART CATHETERIZATION WITH CORONARY ANGIOGRAM N/A 01/04/2014   Procedure: LEFT HEART CATHETERIZATION WITH CORONARY ANGIOGRAM;  Surgeon: [name], MD;  Location: [facility] CATH LAB;  Service: Cardiovascular;  Laterality: N/A;  . PERCUTANEOUS CORONARY STENT INTERVENTION (PCI-S)  12/06/2012   Procedure: PERCUTANEOUS CORONARY STENT INTERVENTION (PCI-S);  Surgeon: [name], MD;  Location: [facility] CATH LAB;  Service: Cardiovascular;;  . PERCUTANEOUS CORONARY STENT INTERVENTION (PCI-S)  05/08/2013   Procedure: PERCUTANEOUS CORONARY STENT INTERVENTION (PCI-S);  Surgeon: [name], MD;  Location: [facility] CATH LAB;  Service: Cardiovascular;;  mid LAD   . TUBAL LIGATION    . VAGINAL HYSTERECTOMY    . VIDEO BRONCHOSCOPY WITH ENDOBRONCHIAL NAVIGATION N/A 02/17/2018   Procedure: VIDEO BRONCHOSCOPY WITH ENDOBRONCHIAL NAVIGATION;  Surgeon: Melrose Nakayama, MD;  Location: [facility];  Service: Thoracic;  Laterality: N/A;    REVIEW OF SYSTEMS:   Review of Systems  Constitutional: Positive for fatigue and generalized weakness. Negative for appetite change, chills, fever and unexpected weight change.  HENT: Positive for persistent dry mouth. Negative for mouth sores, nosebleeds, sore throat and trouble swallowing.   Eyes: Negative for eye problems and icterus.  Respiratory: Negative for cough, hemoptysis, shortness of breath and wheezing.   Cardiovascular: Negative for chest pain and leg swelling.  Gastrointestinal: Negative for abdominal pain, constipation, diarrhea, nausea and vomiting.  Genitourinary: Positive for "bubbly urine". Negative for bladder incontinence, difficulty urinating, dysuria, frequency and hematuria.   Musculoskeletal: Positive for right flank pain. Negative for gait problem, neck pain and neck stiffness.   Skin: Negative for itching and rash.  Neurological: Negative for dizziness, extremity weakness, gait problem, headaches, light-headedness and seizures.  Hematological: Negative for adenopathy. Does not bruise/bleed easily.  Psychiatric/Behavioral: Negative for confusion, depression and sleep disturbance. The patient is not nervous/anxious.     PHYSICAL EXAMINATION:  Blood pressure (!) 131/93, pulse (!) 112, temperature 97.8 F (36.6 C), temperature source Temporal, resp. rate 18, height 5' 6"  (1.676 m), weight 150 lb 2 oz (68.1 kg), SpO2 100 %.  ECOG PERFORMANCE STATUS: 1 - Symptomatic but completely ambulatory  Physical Exam  Constitutional: Oriented to person, place, and time and well-developed, well-nourished, and in no distress.  HENT:  Head: Normocephalic and atraumatic.  Mouth/Throat: Oropharynx is clear and moist. No oropharyngeal exudate.  Eyes: Conjunctivae are normal. Right eye exhibits no discharge. Left eye exhibits no discharge. No scleral icterus.  Neck: Normal range of motion. Neck supple.  Cardiovascular: Normal rate, regular rhythm, normal heart sounds and intact distal pulses.   Pulmonary/Chest: Effort normal. Diminished breath sounds in all lung fields. No respiratory distress. No wheezes. No rales.  Abdominal: Soft. Bowel sounds are normal. Exhibits no distension and no mass. There is no tenderness.  Musculoskeletal: Normal range of motion. Exhibits no edema.  Lymphadenopathy:    No cervical adenopathy.  Neurological: Alert and oriented to person, place, and time. Exhibits normal muscle tone. Gait normal. Coordination normal.  Skin: Skin is warm and dry. No rash noted. Not diaphoretic. No erythema. No pallor.  Psychiatric: Mood, memory and judgment normal.  Vitals reviewed.  LABORATORY DATA: Lab Results  Component Value Date   WBC 9.2 10/11/2018   HGB 10.1 (L) 10/11/2018   HCT 30.2 (L) 10/11/2018   MCV 98.7 10/11/2018   PLT 140 (L) 10/11/2018       Chemistry      Component Value Date/Time   NA 140 10/11/2018 1257   NA 143 01/10/2018 1629   K 3.8 10/11/2018 1257   CL 108 10/11/2018 1257   CO2 23 10/11/2018 1257   BUN 18 10/11/2018 1257   BUN 14 01/10/2018 1629   CREATININE 2.61 (H) 10/11/2018 1257   CREATININE 0.68 08/05/2015 1456      Component Value Date/Time   CALCIUM 8.9 10/11/2018 1257   ALKPHOS 81 10/11/2018 1257   AST 24 10/11/2018 1257   ALT 19 10/11/2018 1257   BILITOT 0.6 10/11/2018 1257       RADIOGRAPHIC STUDIES:  Dg Chest 2 View  Result Date: 10/04/2018 CLINICAL DATA:  Cough and wheezing EXAM: CHEST - 2 VIEW COMPARISON:  February 17, 2018 FINDINGS: Port-A-Cath tip is in the superior vena cava. No pneumothorax. No edema or consolidation. Heart size and pulmonary vascularity are normal. No adenopathy. No bone lesions. A stent is noted in the right coronary artery. IMPRESSION: No edema or consolidation. No adenopathy. Port-A-Cath tip in superior vena cava. Electronically Signed   By: Lowella Grip III M.D.   On: 10/04/2018 14:04   Ct Renal Stone Study  Result Date: 10/04/2018 CLINICAL DATA:  Right low back and pelvic pain for 1.5 weeks. Abnormal urinalysis and elevated white blood cell count. Burning with urination. History of lung cancer. EXAM: CT ABDOMEN AND PELVIS WITHOUT CONTRAST TECHNIQUE: Multidetector CT imaging of the abdomen and pelvis was performed following the standard protocol without IV contrast. COMPARISON:  CT abdomen and pelvis 05/11/2018. FINDINGS: Lower chest: Lung bases are clear. No pleural or pericardial effusion. Hepatobiliary: No focal liver abnormality is seen. No gallstones, gallbladder wall thickening, or biliary dilatation. Pancreas: Unremarkable. No pancreatic ductal dilatation or surrounding inflammatory changes. Spleen: Normal in size without focal abnormality. Adrenals/Urinary Tract: Adrenal glands are unremarkable. Kidneys are normal, without renal calculi, focal lesion, or  hydronephrosis. Bladder is unremarkable. Stomach/Bowel: Stomach is within normal limits. Appendix appears normal. No evidence of bowel wall thickening, distention, or inflammatory changes. Vascular/Lymphatic: Aortic atherosclerosis. No enlarged abdominal or pelvic lymph nodes. Reproductive: Status post hysterectomy. No adnexal masses. Other: None. Musculoskeletal: No focal lesion. No fracture. Degenerative disc disease lower lumbar spine noted. IMPRESSION: Negative for urinary tract stone. No  acute abnormality abdomen or pelvis. Atherosclerosis. Electronically Signed   By: Inge Rise M.D.   On: 10/04/2018 14:00     ASSESSMENT/PLAN:  This is a very pleasant 63 year old Caucasian female who was diagnosed with stage IV non-small cell lung cancer, adenocarcinoma. She has no actionable mutations and her PDL 1 expression is 1%. She was diagnosed in December 2019. She presented with bilateral pulmonary nodules.  She is underwent treatment with carboplatin for an AUC 5, Alimta 500 mg/m, and Keytruda 200 mg IV every 3 weeks. [name] was dropped after cycle #2 due to intolerability. She is status post 4 cycles.   She is currently undergoing single agent Alimta 500 mg/m2 every 3 weeks. She is status post 3 cycles. Her last dose was n 08/23/2018 due to the patient canceling her July appointments and being hospitalized from 10/04/2018-10/08/2018.   The patient was seen with Dr. Julien Nordmann. Labs were reviewed. The patient's creatinine is still elevated at 2.61. Her workup in the hospital was unrevealing for a clear etiology for her AKI. We will hydrate the patient today with 1L of normal saline. She was encouraged to stay hydrated and drink plenty of fluids at home.   I will arrange for an urgent referral to nephrology for further evaluation.   The patient will not receive chemotherapy today.   We will see the patient back for a follow up visit in 3 weeks for evaluation and repeat blood work before resuming  treatment with cycle #4 of single agent Alimta.   The patient was advised that if she develops any new or worsening symptoms including but not limited to dysuria, fevers, nausea, vomiting, pain, etc to seek medical evaluation.   The patient was advised to call immediately if she has any concerning symptoms in the interval. The patient voices understanding of current disease status and treatment options and is in agreement with the current care plan. All questions were answered. The patient knows to call the clinic with any problems, questions or concerns. We can certainly see the patient much sooner if necessary  Orders Placed This Encounter  Procedures  . Ambulatory referral to Nephrology    Referral Priority:   Routine    Referral Type:   Consultation    Referral Reason:   Specialty Services Required    Referred to Provider:   Elmarie Shiley, MD    Requested Specialty:   Nephrology    Number of Visits Requested:   [place], PA-C 10/11/18  ADDENDUM: Hematology/Oncology Attending: I had a face-to-face encounter with the patient today.  I recommended her care plan.  This is a very pleasant 63 years old white female with metastatic non-small cell lung cancer, adenocarcinoma with no actionable mutations started on treatment with chemotherapy with carboplatin, Alimta and Keytruda but [name] was discontinued after cycle #2 secondary to significant skin rash.  She is status post a total of 4 cycles.  The patient was supposed to start the first cycle of her maintenance therapy with Alimta last week but unfortunately she has significant renal insufficiency.  She was admitted to the hospital and underwent treatment for pyelonephritis as well as IV hydration.  There was no improvement in her serum creatinine by the time she was discharged from the hospital.  She came today for reevaluation before resuming her treatment. I recommended for the patient to delay her treatment until  improvement of the renal function. We will arrange for the patient to receive IV hydration  with normal saline today.  We will also refer the patient to Dr. Posey Pronto with [facility] for evaluation of her renal insufficiency. We will see her back for follow-up visit in 3 weeks for reevaluation before resuming her treatment. The patient was advised to call immediately if she has any concerning symptoms in the interval.  Disclaimer: This note was dictated with voice recognition software. Similar sounding words can inadvertently be transcribed and may be missed upon review. Eilleen Kempf, MD 10/11/18

## 2018-10-19 ENCOUNTER — Telehealth: Payer: Self-pay | Admitting: Medical Oncology

## 2018-10-19 NOTE — Telephone Encounter (Signed)
Pt has not heard about an appt.  Called Dr Ena Dawley office and they requested records. Message sent to HIM to fax records.

## 2018-10-20 ENCOUNTER — Telehealth: Payer: Self-pay | Admitting: Medical Oncology

## 2018-10-20 ENCOUNTER — Other Ambulatory Visit: Payer: Self-pay | Admitting: Medical Oncology

## 2018-10-20 DIAGNOSIS — N179 Acute kidney failure, unspecified: Secondary | ICD-10-CM

## 2018-10-20 NOTE — Telephone Encounter (Signed)
Nephrology referral - pt asking about status. Lab appt tomorrow for recheck BMET. Message forwarded to HIM.

## 2018-10-21 ENCOUNTER — Inpatient Hospital Stay: Payer: Managed Care, Other (non HMO)

## 2018-10-21 ENCOUNTER — Other Ambulatory Visit: Payer: Self-pay

## 2018-10-21 ENCOUNTER — Telehealth: Payer: Self-pay | Admitting: *Deleted

## 2018-10-21 ENCOUNTER — Telehealth: Payer: Self-pay | Admitting: Medical Oncology

## 2018-10-21 DIAGNOSIS — C3492 Malignant neoplasm of unspecified part of left bronchus or lung: Secondary | ICD-10-CM | POA: Diagnosis not present

## 2018-10-21 DIAGNOSIS — N179 Acute kidney failure, unspecified: Secondary | ICD-10-CM

## 2018-10-21 LAB — BASIC METABOLIC PANEL - CANCER CENTER ONLY
Anion gap: 13 (ref 5–15)
BUN: 24 mg/dL — ABNORMAL HIGH (ref 8–23)
CO2: 21 mmol/L — ABNORMAL LOW (ref 22–32)
Calcium: 9.4 mg/dL (ref 8.9–10.3)
Chloride: 104 mmol/L (ref 98–111)
Creatinine: 3.29 mg/dL (ref 0.44–1.00)
GFR, Est AFR Am: 16 mL/min — ABNORMAL LOW (ref >60)
GFR, Estimated: 14 mL/min — ABNORMAL LOW (ref >60)
Glucose, Bld: 152 mg/dL — ABNORMAL HIGH (ref 70–99)
Potassium: 4.3 mmol/L (ref 3.5–5.1)
Sodium: 138 mmol/L (ref 135–145)

## 2018-10-21 NOTE — Telephone Encounter (Signed)
Received call report from [facility].  "Today's Creat = 3.29."  Reached collaborative with results and appointment note patient waiting.

## 2018-10-21 NOTE — Telephone Encounter (Signed)
Nephrology referral- Pt BMET faxed to Dr Posey Pronto [place]) and will be reviewed today to prioritize appt.    Reviewed labs with pt and gave her number to Dr Ena Dawley office to call this afternoon re appt.   Pt instructed to go to ED for any fever , chills, dysuria, dizziness or any other unusual symptoms. Pt voiced understanding.

## 2018-10-25 ENCOUNTER — Telehealth: Payer: Self-pay | Admitting: Internal Medicine

## 2018-10-25 ENCOUNTER — Ambulatory Visit: Payer: Managed Care, Other (non HMO)

## 2018-10-25 ENCOUNTER — Ambulatory Visit: Payer: Managed Care, Other (non HMO) | Admitting: Internal Medicine

## 2018-10-25 ENCOUNTER — Other Ambulatory Visit: Payer: Managed Care, Other (non HMO)

## 2018-11-01 ENCOUNTER — Inpatient Hospital Stay: Payer: Managed Care, Other (non HMO) | Attending: Internal Medicine

## 2018-11-01 ENCOUNTER — Inpatient Hospital Stay (HOSPITAL_BASED_OUTPATIENT_CLINIC_OR_DEPARTMENT_OTHER): Payer: Managed Care, Other (non HMO) | Admitting: Internal Medicine

## 2018-11-01 ENCOUNTER — Inpatient Hospital Stay: Payer: Managed Care, Other (non HMO)

## 2018-11-01 ENCOUNTER — Other Ambulatory Visit: Payer: Self-pay | Admitting: Medical Oncology

## 2018-11-01 ENCOUNTER — Telehealth: Payer: Self-pay | Admitting: Internal Medicine

## 2018-11-01 ENCOUNTER — Other Ambulatory Visit: Payer: Self-pay

## 2018-11-01 ENCOUNTER — Encounter: Payer: Self-pay | Admitting: Internal Medicine

## 2018-11-01 VITALS — BP 149/86 | HR 88 | Temp 98.7°F | Resp 20 | Ht 66.0 in | Wt 153.9 lb

## 2018-11-01 DIAGNOSIS — Z95828 Presence of other vascular implants and grafts: Secondary | ICD-10-CM

## 2018-11-01 DIAGNOSIS — I2510 Atherosclerotic heart disease of native coronary artery without angina pectoris: Secondary | ICD-10-CM | POA: Insufficient documentation

## 2018-11-01 DIAGNOSIS — C3492 Malignant neoplasm of unspecified part of left bronchus or lung: Secondary | ICD-10-CM

## 2018-11-01 DIAGNOSIS — R350 Frequency of micturition: Secondary | ICD-10-CM | POA: Insufficient documentation

## 2018-11-01 DIAGNOSIS — R5383 Other fatigue: Secondary | ICD-10-CM | POA: Diagnosis not present

## 2018-11-01 DIAGNOSIS — Z9221 Personal history of antineoplastic chemotherapy: Secondary | ICD-10-CM | POA: Diagnosis not present

## 2018-11-01 DIAGNOSIS — M1990 Unspecified osteoarthritis, unspecified site: Secondary | ICD-10-CM | POA: Insufficient documentation

## 2018-11-01 DIAGNOSIS — N179 Acute kidney failure, unspecified: Secondary | ICD-10-CM

## 2018-11-01 DIAGNOSIS — R531 Weakness: Secondary | ICD-10-CM | POA: Insufficient documentation

## 2018-11-01 DIAGNOSIS — Z79899 Other long term (current) drug therapy: Secondary | ICD-10-CM | POA: Insufficient documentation

## 2018-11-01 DIAGNOSIS — E039 Hypothyroidism, unspecified: Secondary | ICD-10-CM | POA: Insufficient documentation

## 2018-11-01 DIAGNOSIS — J449 Chronic obstructive pulmonary disease, unspecified: Secondary | ICD-10-CM | POA: Diagnosis not present

## 2018-11-01 DIAGNOSIS — N289 Disorder of kidney and ureter, unspecified: Secondary | ICD-10-CM | POA: Insufficient documentation

## 2018-11-01 DIAGNOSIS — Z7982 Long term (current) use of aspirin: Secondary | ICD-10-CM | POA: Diagnosis not present

## 2018-11-01 DIAGNOSIS — Z452 Encounter for adjustment and management of vascular access device: Secondary | ICD-10-CM | POA: Diagnosis not present

## 2018-11-01 DIAGNOSIS — K589 Irritable bowel syndrome without diarrhea: Secondary | ICD-10-CM | POA: Insufficient documentation

## 2018-11-01 DIAGNOSIS — Z7951 Long term (current) use of inhaled steroids: Secondary | ICD-10-CM | POA: Insufficient documentation

## 2018-11-01 DIAGNOSIS — C3491 Malignant neoplasm of unspecified part of right bronchus or lung: Secondary | ICD-10-CM | POA: Diagnosis present

## 2018-11-01 DIAGNOSIS — T451X5A Adverse effect of antineoplastic and immunosuppressive drugs, initial encounter: Secondary | ICD-10-CM

## 2018-11-01 DIAGNOSIS — D6481 Anemia due to antineoplastic chemotherapy: Secondary | ICD-10-CM

## 2018-11-01 DIAGNOSIS — E785 Hyperlipidemia, unspecified: Secondary | ICD-10-CM | POA: Insufficient documentation

## 2018-11-01 DIAGNOSIS — I10 Essential (primary) hypertension: Secondary | ICD-10-CM | POA: Insufficient documentation

## 2018-11-01 DIAGNOSIS — Z5111 Encounter for antineoplastic chemotherapy: Secondary | ICD-10-CM

## 2018-11-01 DIAGNOSIS — C3490 Malignant neoplasm of unspecified part of unspecified bronchus or lung: Secondary | ICD-10-CM | POA: Diagnosis not present

## 2018-11-01 DIAGNOSIS — K219 Gastro-esophageal reflux disease without esophagitis: Secondary | ICD-10-CM | POA: Insufficient documentation

## 2018-11-01 LAB — CBC WITH DIFFERENTIAL (CANCER CENTER ONLY)
Abs Immature Granulocytes: 0.01 10*3/uL (ref 0.00–0.07)
Basophils Absolute: 0 10*3/uL (ref 0.0–0.1)
Basophils Relative: 1 %
Eosinophils Absolute: 0.3 10*3/uL (ref 0.0–0.5)
Eosinophils Relative: 4 %
HCT: 28 % — ABNORMAL LOW (ref 36.0–46.0)
Hemoglobin: 9.4 g/dL — ABNORMAL LOW (ref 12.0–15.0)
Immature Granulocytes: 0 %
Lymphocytes Relative: 21 %
Lymphs Abs: 1.4 10*3/uL (ref 0.7–4.0)
MCH: 32.9 pg (ref 26.0–34.0)
MCHC: 33.6 g/dL (ref 30.0–36.0)
MCV: 97.9 fL (ref 80.0–100.0)
Monocytes Absolute: 0.6 10*3/uL (ref 0.1–1.0)
Monocytes Relative: 9 %
Neutro Abs: 4.3 10*3/uL (ref 1.7–7.7)
Neutrophils Relative %: 65 %
Platelet Count: 164 10*3/uL (ref 150–400)
RBC: 2.86 MIL/uL — ABNORMAL LOW (ref 3.87–5.11)
RDW: 13.5 % (ref 11.5–15.5)
WBC Count: 6.7 10*3/uL (ref 4.0–10.5)
nRBC: 0 % (ref 0.0–0.2)

## 2018-11-01 LAB — CMP (CANCER CENTER ONLY)
ALT: 16 U/L (ref 0–44)
AST: 19 U/L (ref 15–41)
Albumin: 3.8 g/dL (ref 3.5–5.0)
Alkaline Phosphatase: 77 U/L (ref 38–126)
Anion gap: 10 (ref 5–15)
BUN: 18 mg/dL (ref 8–23)
CO2: 23 mmol/L (ref 22–32)
Calcium: 8.2 mg/dL — ABNORMAL LOW (ref 8.9–10.3)
Chloride: 110 mmol/L (ref 98–111)
Creatinine: 2.19 mg/dL — ABNORMAL HIGH (ref 0.44–1.00)
GFR, Est AFR Am: 27 mL/min — ABNORMAL LOW (ref >60)
GFR, Estimated: 23 mL/min — ABNORMAL LOW (ref >60)
Glucose, Bld: 136 mg/dL — ABNORMAL HIGH (ref 70–99)
Potassium: 3.2 mmol/L — ABNORMAL LOW (ref 3.5–5.1)
Sodium: 143 mmol/L (ref 135–145)
Total Bilirubin: 0.3 mg/dL (ref 0.3–1.2)
Total Protein: 6.9 g/dL (ref 6.5–8.1)

## 2018-11-01 MED ORDER — SODIUM CHLORIDE 0.9% FLUSH
10.0000 mL | Freq: Once | INTRAVENOUS | Status: AC
  Administered 2018-11-01: 11:00:00 10 mL
  Filled 2018-11-01: qty 10

## 2018-11-01 MED ORDER — ONDANSETRON HCL 8 MG PO TABS: 8.0000 mg | ORAL_TABLET | Freq: Three times a day (TID) | ORAL | 0 refills | Status: DC | PRN

## 2018-11-01 NOTE — Telephone Encounter (Signed)
Returned patient's phone call, trasnferred patient to speak withwith providers nurse about a concern per patient's request.

## 2018-11-01 NOTE — Progress Notes (Signed)
[place] [phone]   [phone]  OFFICE PROGRESS NOTE  [name], NP [place] [id number]  DIAGNOSIS: stage IV (T1a, N0, M1 a) non-small cell lung cancer, adenocarcinoma presented with multifocal disease in both lungs including 3 nodules in the left lung as well as 2 nodules in the right lung diagnosed in December 2019.  No actionable mutations KRAS G12C BCOR N1425S-subclonal RAD21 amplification  PDL 1 expression 1%.  PRIOR THERAPY:  1) Systemic chemotherapy with carboplatin for AUC of 5, Alimta 500 mg/M2 and Keytruda 200 mg IV every 3 weeks. First dose April 12, 2018. Status post 4 cycles. starting from cycle #3, Keytruda was dropped and patient received treatment with Carboplatin for an AUC of 5 and Alimta 500 mg/m2 IV every 3 weeks.  CURRENT THERAPY: Systemic chemotherapy with Alimta 500 mg/m2 IV every 3 weeks. Status post 3 cycles of single agent Alimta. Most recent dose given on 08/23/2018  INTERVAL HISTORY: [name] 63 y.o. female returns to the clinic today for follow-up visit.  The patient continues to complain of increasing fatigue and weakness as well as frequent urination especially at nighttime.  She also has mild nausea.  She denied having any current chest pain, shortness of breath, cough or hemoptysis.  She denied having any fever or chills.  She has no diarrhea or constipation.  She denied having any headache or visual changes.  She has been off treatment since June 2020 because of the worsening renal insufficiency.  She is scheduled to see Dr. Moshe Cipro tomorrow for evaluation of her renal insufficiency.  MEDICAL HISTORY: Past Medical History:  Diagnosis Date   Anemia    "long time ago" (12/06/2012)   Anxiety    Arthritis    "right knee real bad; in my hands bad" (12/06/2012)   Asthma    Celiac disease    Colon polyps    COPD (chronic obstructive pulmonary disease) (HCC)      Coronary artery disease    a. s/p Xience DES to [facility] 04/2008;  b. LHC 05/2008: Proximal RCA 25%, mid RCA stent patent.  c. Anormal nuc 2014 -> s/p LHC with severe mRCA stenosis s/p DES. d. Cath 04/2013 s/p DES to LAD.  e. LHC 08/2015 was stable.   Depression    "years ago" (12/06/2012)   Fibromyalgia    Gallstones    GERD (gastroesophageal reflux disease)    H/O hiatal hernia    Hepatitis    "not A, B, or C" (12/06/2012)   Hyperlipidemia    intol to statins and other chol agents due to elevated LFTs   Hypertension    Hypothyroidism    IBS (irritable bowel syndrome)    Interstitial cystitis    Lung cancer ([place]) dx'd 01/2018   Lung nodules    Bilateral   Migraines    "when I was younger" (12/06/2012)   Pneumonia    PONV (postoperative nausea and vomiting)    "Very bad"   Premature atrial contractions    PVC's (premature ventricular contractions)    Sinus headache    SVT (supraventricular tachycardia) (HCC)     ALLERGIES:  is allergic to ciprofloxacin; levofloxacin; statins; tricor [fenofibrate]; compazine [prochlorperazine]; latex; codeine; and metoprolol.  MEDICATIONS:  Current Outpatient Medications  Medication Sig Dispense Refill   albuterol (PROAIR HFA) 108 (90 Base) MCG/ACT inhaler Inhale 1 puff into the lungs every 6 (six) hours as needed (wheezing/shortness of breath.).  albuterol (PROVENTIL) (5 MG/ML) 0.5% nebulizer solution Take 2.5 mg by nebulization every 6 (six) hours as needed for wheezing.     aspirin 81 MG chewable tablet Chew 1 tablet (81 mg total) by mouth daily.     BELSOMRA 20 MG TABS Take 20 mg by mouth at bedtime as needed for sleep.     buPROPion (WELLBUTRIN SR) 150 MG 12 hr tablet Take 150 mg by mouth daily.      DULoxetine (CYMBALTA) 60 MG capsule Take 120 mg by mouth daily.     folic acid (FOLVITE) 1 MG tablet Take 1 tablet (1 mg total) by mouth daily. (Patient taking differently: Take 1 mg by mouth every evening. ) 30 tablet  4   HYDROcodone-acetaminophen (NORCO) 7.5-325 MG per tablet Take 1 tablet by mouth every 6 (six) hours as needed for pain.     ipratropium-albuterol (DUONEB) 0.5-2.5 (3) MG/3ML SOLN Inhale 3 mLs into the lungs daily.     levothyroxine (SYNTHROID, LEVOTHROID) 25 MCG tablet Take 25 mcg by mouth daily before breakfast.     lidocaine-prilocaine (EMLA) cream Apply 1 application topically as needed. 30 g 0   LORazepam (ATIVAN) 2 MG tablet Take 2 mg by mouth every evening.      metoprolol tartrate (LOPRESSOR) 25 MG tablet Take 12.5 mg by mouth 2 (two) times daily.      montelukast (SINGULAIR) 10 MG tablet Take 10 mg by mouth at bedtime.      omeprazole (PRILOSEC) 40 MG capsule Take 1 tablet by mouth twice a day for 2 weeks then 1 tablet every morning thereafter (Patient taking differently: Take 40 mg by mouth daily. ) 45 capsule 8   ondansetron (ZOFRAN ODT) 4 MG disintegrating tablet Take 1 tablet (4 mg total) by mouth every 8 (eight) hours as needed for nausea or vomiting. 20 tablet 0   SYMBICORT 160-4.5 MCG/ACT inhaler Take 2 puffs by mouth 2 (two) times daily.  3   amLODipine (NORVASC) 5 MG tablet Take 1.5 tablets (7.5 mg total) by mouth daily. (Patient taking differently: Take 7.5 mg by mouth every evening. ) 180 tablet 3   loperamide (IMODIUM) 2 MG capsule Take 1 capsule (2 mg total) by mouth as needed for diarrhea or loose stools (Initiate if stool C diff -ve). (Patient not taking: Reported on 10/11/2018) 30 capsule 0   nitroGLYCERIN (NITROSTAT) 0.4 MG SL tablet Place 1 tablet (0.4 mg total) under the tongue every 5 (five) minutes as needed for chest pain. (Patient not taking: Reported on 10/04/2018) 25 tablet 12   ondansetron (ZOFRAN) 8 MG tablet Take 1 tablet (8 mg total) by mouth every 8 (eight) hours as needed for nausea or vomiting. (Patient not taking: Reported on 10/11/2018) 30 tablet 0   vitamin B-12 100 MCG tablet Take 1 tablet (100 mcg total) by mouth daily. (Patient not taking:  Reported on 10/04/2018) 30 tablet 1   No current facility-administered medications for this visit.     SURGICAL HISTORY:  Past Surgical History:  Procedure Laterality Date   CARDIAC CATHETERIZATION  04/2008; 05/2008   CARDIAC CATHETERIZATION N/A 08/07/2015   Procedure: Left Heart Cath and Coronary Angiography;  Surgeon: Sherren Mocha, MD;  Location: [facility];  Service: Cardiovascular;  Laterality: N/A;   CHOLECYSTECTOMY     CORONARY ANGIOPLASTY WITH STENT PLACEMENT  04/2008 12/06/2012   "1 + 1" (12/06/2012)   CORONARY STENT PLACEMENT  05/08/2013   DES TO LAD      DR Angelena Form  IR IMAGING GUIDED PORT INSERTION  04/29/2018   LEFT HEART CATHETERIZATION WITH CORONARY ANGIOGRAM N/A 12/06/2012   Procedure: LEFT HEART CATHETERIZATION WITH CORONARY ANGIOGRAM;  Surgeon: [name], MD;  Location: [facility] CATH LAB;  Service: Cardiovascular;  Laterality: N/A;   LEFT HEART CATHETERIZATION WITH CORONARY ANGIOGRAM N/A 05/08/2013   Procedure: LEFT HEART CATHETERIZATION WITH CORONARY ANGIOGRAM;  Surgeon: [name], MD;  Location: [facility] CATH LAB;  Service: Cardiovascular;  Laterality: N/A;   LEFT HEART CATHETERIZATION WITH CORONARY ANGIOGRAM N/A 01/04/2014   Procedure: LEFT HEART CATHETERIZATION WITH CORONARY ANGIOGRAM;  Surgeon: [name], MD;  Location: [facility] CATH LAB;  Service: Cardiovascular;  Laterality: N/A;   PERCUTANEOUS CORONARY STENT INTERVENTION (PCI-S)  12/06/2012   Procedure: PERCUTANEOUS CORONARY STENT INTERVENTION (PCI-S);  Surgeon: [name], MD;  Location: [facility] CATH LAB;  Service: Cardiovascular;;   PERCUTANEOUS CORONARY STENT INTERVENTION (PCI-S)  05/08/2013   Procedure: PERCUTANEOUS CORONARY STENT INTERVENTION (PCI-S);  Surgeon: [name], MD;  Location: [facility] CATH LAB;  Service: Cardiovascular;;  mid LAD    TUBAL LIGATION     VAGINAL HYSTERECTOMY     VIDEO BRONCHOSCOPY WITH ENDOBRONCHIAL NAVIGATION N/A 02/17/2018   Procedure:  VIDEO BRONCHOSCOPY WITH ENDOBRONCHIAL NAVIGATION;  Surgeon: Melrose Nakayama, MD;  Location: [place];  Service: Thoracic;  Laterality: N/A;    REVIEW OF SYSTEMS:  A comprehensive review of systems was negative except for: Constitutional: positive for fatigue Genitourinary: positive for frequency   PHYSICAL EXAMINATION: General appearance: alert, cooperative, fatigued and no distress Head: Normocephalic, without obvious abnormality, atraumatic Neck: no adenopathy, no JVD, supple, symmetrical, trachea midline and thyroid not enlarged, symmetric, no tenderness/mass/nodules Lymph nodes: Cervical, supraclavicular, and axillary nodes normal. Resp: clear to auscultation bilaterally Back: symmetric, no curvature. ROM normal. No CVA tenderness. Cardio: regular rate and rhythm, S1, S2 normal, no murmur, click, rub or gallop GI: soft, non-tender; bowel sounds normal; no masses,  no organomegaly Extremities: extremities normal, atraumatic, no cyanosis or edema  ECOG PERFORMANCE STATUS: 1 - Symptomatic but completely ambulatory  Blood pressure (!) 149/86, pulse 88, temperature 98.7 F (37.1 C), temperature source Oral, resp. rate 20, height 5' 6"  (1.676 m), weight 153 lb 14.4 oz (69.8 kg), SpO2 100 %.  LABORATORY DATA: Lab Results  Component Value Date   WBC 6.7 11/01/2018   HGB 9.4 (L) 11/01/2018   HCT 28.0 (L) 11/01/2018   MCV 97.9 11/01/2018   PLT 164 11/01/2018      Chemistry      Component Value Date/Time   NA 143 11/01/2018 1103   NA 143 01/10/2018 1629   K 3.2 (L) 11/01/2018 1103   CL 110 11/01/2018 1103   CO2 23 11/01/2018 1103   BUN 18 11/01/2018 1103   BUN 14 01/10/2018 1629   CREATININE 2.19 (H) 11/01/2018 1103   CREATININE 0.68 08/05/2015 1456      Component Value Date/Time   CALCIUM 8.2 (L) 11/01/2018 1103   ALKPHOS 77 11/01/2018 1103   AST 19 11/01/2018 1103   ALT 16 11/01/2018 1103   BILITOT 0.3 11/01/2018 1103       RADIOGRAPHIC STUDIES: Dg Chest 2  View  Result Date: 10/04/2018 CLINICAL DATA:  Cough and wheezing EXAM: CHEST - 2 VIEW COMPARISON:  February 17, 2018 FINDINGS: Port-A-Cath tip is in the superior vena cava. No pneumothorax. No edema or consolidation. Heart size and pulmonary vascularity are normal. No adenopathy. No bone lesions. A stent is noted in the right coronary artery. IMPRESSION: No edema  or consolidation. No adenopathy. Port-A-Cath tip in superior vena cava. Electronically Signed   By: Lowella Grip III M.D.   On: 10/04/2018 14:04   Ct Renal Stone Study  Result Date: 10/04/2018 CLINICAL DATA:  Right low back and pelvic pain for 1.5 weeks. Abnormal urinalysis and elevated white blood cell count. Burning with urination. History of lung cancer. EXAM: CT ABDOMEN AND PELVIS WITHOUT CONTRAST TECHNIQUE: Multidetector CT imaging of the abdomen and pelvis was performed following the standard protocol without IV contrast. COMPARISON:  CT abdomen and pelvis 05/11/2018. FINDINGS: Lower chest: Lung bases are clear. No pleural or pericardial effusion. Hepatobiliary: No focal liver abnormality is seen. No gallstones, gallbladder wall thickening, or biliary dilatation. Pancreas: Unremarkable. No pancreatic ductal dilatation or surrounding inflammatory changes. Spleen: Normal in size without focal abnormality. Adrenals/Urinary Tract: Adrenal glands are unremarkable. Kidneys are normal, without renal calculi, focal lesion, or hydronephrosis. Bladder is unremarkable. Stomach/Bowel: Stomach is within normal limits. Appendix appears normal. No evidence of bowel wall thickening, distention, or inflammatory changes. Vascular/Lymphatic: Aortic atherosclerosis. No enlarged abdominal or pelvic lymph nodes. Reproductive: Status post hysterectomy. No adnexal masses. Other: None. Musculoskeletal: No focal lesion. No fracture. Degenerative disc disease lower lumbar spine noted. IMPRESSION: Negative for urinary tract stone. No acute abnormality abdomen or pelvis.  Atherosclerosis. Electronically Signed   By: Inge Rise M.D.   On: 10/04/2018 14:00    ASSESSMENT AND PLAN: This is a very pleasant 63 years old white female recently diagnosed with a stage IV non-small cell lung cancer, adenocarcinoma with no actionable mutations and PDL 1 expression of 1% diagnosed in December 2019 and presented with bilateral pulmonary nodules. The patient was initially started on treatment with carboplatin, Alimta and Keytruda status post 4 cycles but Keytruda was discontinued after cycle #2 secondary to significant skin rash. She was treated with maintenance treatment with single agent Alimta for 3 cycles but this is currently on hold since June 2020 secondary to worsening renal insufficiency. I recommended for the patient to continue holding her treatment for now until improvement of her renal function. I will arrange for the patient to have repeat CT scan of the chest without contrast in less than 2 weeks for restaging of her disease. For the renal insufficiency she is scheduled to see nephrology tomorrow but there was some mild improvement in her renal function on the lab performed earlier today. She was advised to call immediately if she has any concerning symptoms in the interval. The patient voices understanding of current disease status and treatment options and is in agreement with the current care plan.  All questions were answered. The patient knows to call the clinic with any problems, questions or concerns. We can certainly see the patient much sooner if necessary.  I spent 10 minutes counseling the patient face to face. The total time spent in the appointment was 15 minutes.  Disclaimer: This note was dictated with voice recognition software. Similar sounding words can inadvertently be transcribed and may not be corrected upon review.

## 2018-11-01 NOTE — Progress Notes (Signed)
Nutrition  RD planning to see patient in infusion today but cancelled.  RD called patient, no answer.  Left message with call back number.  Farris Blash B. Zenia Resides, [place], [place] Registered Dietitian [phone] (pager)

## 2018-11-02 ENCOUNTER — Telehealth: Payer: Self-pay

## 2018-11-02 ENCOUNTER — Inpatient Hospital Stay: Payer: Managed Care, Other (non HMO)

## 2018-11-02 DIAGNOSIS — Z95828 Presence of other vascular implants and grafts: Secondary | ICD-10-CM

## 2018-11-02 DIAGNOSIS — C3492 Malignant neoplasm of unspecified part of left bronchus or lung: Secondary | ICD-10-CM | POA: Diagnosis not present

## 2018-11-02 MED ORDER — SODIUM CHLORIDE 0.9% FLUSH
10.0000 mL | Freq: Once | INTRAVENOUS | Status: AC
  Administered 2018-11-02: 10 mL via INTRAVENOUS
  Filled 2018-11-02: qty 10

## 2018-11-02 MED ORDER — HEPARIN SOD (PORK) LOCK FLUSH 100 UNIT/ML IV SOLN
500.0000 [IU] | Freq: Once | INTRAVENOUS | Status: AC
  Administered 2018-11-02: 500 [IU] via INTRAVENOUS
  Filled 2018-11-02: qty 5

## 2018-11-02 NOTE — Telephone Encounter (Signed)
Attempted to reach patient. Her port appt was at 330 and she had not arrived when I tried to reach her. No answer. I was calling to see if she was coming and if she was aware today we will be finished in clinic by 5pm and she had to be here by 5pm.

## 2018-11-03 ENCOUNTER — Telehealth: Payer: Self-pay | Admitting: Medical Oncology

## 2018-11-03 ENCOUNTER — Telehealth: Payer: Self-pay | Admitting: Internal Medicine

## 2018-11-03 NOTE — Telephone Encounter (Signed)
Confirmed with patient appointments for 9/14 and 9/15. Central radiology will call re scan. Patient aware MM will let her know at 9/15 visit if she should proceed with next treatment appointment on 9/22.

## 2018-11-03 NOTE — Telephone Encounter (Signed)
Huber needled deaccessed yesterday.

## 2018-11-14 ENCOUNTER — Inpatient Hospital Stay: Payer: Managed Care, Other (non HMO)

## 2018-11-14 ENCOUNTER — Other Ambulatory Visit: Payer: Managed Care, Other (non HMO)

## 2018-11-14 ENCOUNTER — Ambulatory Visit (HOSPITAL_COMMUNITY): Admission: RE | Admit: 2018-11-14 | Payer: Managed Care, Other (non HMO) | Source: Ambulatory Visit

## 2018-11-15 ENCOUNTER — Inpatient Hospital Stay: Payer: Managed Care, Other (non HMO)

## 2018-11-15 ENCOUNTER — Ambulatory Visit (HOSPITAL_COMMUNITY)
Admission: RE | Admit: 2018-11-15 | Discharge: 2018-11-15 | Disposition: A | Discharge status: Home / Self Care | Payer: Managed Care, Other (non HMO) | Source: Ambulatory Visit | Attending: Internal Medicine | Admitting: Internal Medicine

## 2018-11-15 ENCOUNTER — Inpatient Hospital Stay (HOSPITAL_BASED_OUTPATIENT_CLINIC_OR_DEPARTMENT_OTHER): Payer: Managed Care, Other (non HMO) | Admitting: Internal Medicine

## 2018-11-15 ENCOUNTER — Encounter (HOSPITAL_COMMUNITY): Payer: Self-pay

## 2018-11-15 ENCOUNTER — Other Ambulatory Visit: Payer: Self-pay

## 2018-11-15 ENCOUNTER — Other Ambulatory Visit: Payer: Self-pay | Admitting: Internal Medicine

## 2018-11-15 ENCOUNTER — Encounter: Payer: Self-pay | Admitting: Internal Medicine

## 2018-11-15 VITALS — BP 137/88 | HR 76 | Temp 98.0°F | Resp 18 | Ht 66.0 in | Wt 152.3 lb

## 2018-11-15 DIAGNOSIS — C3492 Malignant neoplasm of unspecified part of left bronchus or lung: Secondary | ICD-10-CM

## 2018-11-15 DIAGNOSIS — Z95828 Presence of other vascular implants and grafts: Secondary | ICD-10-CM

## 2018-11-15 DIAGNOSIS — C3490 Malignant neoplasm of unspecified part of unspecified bronchus or lung: Secondary | ICD-10-CM

## 2018-11-15 DIAGNOSIS — Z716 Tobacco abuse counseling: Secondary | ICD-10-CM

## 2018-11-15 DIAGNOSIS — Z5111 Encounter for antineoplastic chemotherapy: Secondary | ICD-10-CM | POA: Diagnosis not present

## 2018-11-15 LAB — CMP (CANCER CENTER ONLY)
ALT: 14 U/L (ref 0–44)
AST: 15 U/L (ref 15–41)
Albumin: 4.2 g/dL (ref 3.5–5.0)
Alkaline Phosphatase: 86 U/L (ref 38–126)
Anion gap: 8 (ref 5–15)
BUN: 24 mg/dL — ABNORMAL HIGH (ref 8–23)
CO2: 23 mmol/L (ref 22–32)
Calcium: 9.2 mg/dL (ref 8.9–10.3)
Chloride: 110 mmol/L (ref 98–111)
Creatinine: 1.81 mg/dL — ABNORMAL HIGH (ref 0.44–1.00)
GFR, Est AFR Am: 34 mL/min — ABNORMAL LOW (ref >60)
GFR, Estimated: 29 mL/min — ABNORMAL LOW (ref >60)
Glucose, Bld: 104 mg/dL — ABNORMAL HIGH (ref 70–99)
Potassium: 4.1 mmol/L (ref 3.5–5.1)
Sodium: 141 mmol/L (ref 135–145)
Total Bilirubin: 0.3 mg/dL (ref 0.3–1.2)
Total Protein: 7.6 g/dL (ref 6.5–8.1)

## 2018-11-15 LAB — CBC WITH DIFFERENTIAL (CANCER CENTER ONLY)
Abs Immature Granulocytes: 0.05 10*3/uL (ref 0.00–0.07)
Basophils Absolute: 0.1 10*3/uL (ref 0.0–0.1)
Basophils Relative: 1 %
Eosinophils Absolute: 0.3 10*3/uL (ref 0.0–0.5)
Eosinophils Relative: 3 %
HCT: 34.5 % — ABNORMAL LOW (ref 36.0–46.0)
Hemoglobin: 11.4 g/dL — ABNORMAL LOW (ref 12.0–15.0)
Immature Granulocytes: 1 %
Lymphocytes Relative: 27 %
Lymphs Abs: 2.6 10*3/uL (ref 0.7–4.0)
MCH: 32.1 pg (ref 26.0–34.0)
MCHC: 33 g/dL (ref 30.0–36.0)
MCV: 97.2 fL (ref 80.0–100.0)
Monocytes Absolute: 0.9 10*3/uL (ref 0.1–1.0)
Monocytes Relative: 9 %
Neutro Abs: 6 10*3/uL (ref 1.7–7.7)
Neutrophils Relative %: 59 %
Platelet Count: 257 10*3/uL (ref 150–400)
RBC: 3.55 MIL/uL — ABNORMAL LOW (ref 3.87–5.11)
RDW: 13.7 % (ref 11.5–15.5)
WBC Count: 9.8 10*3/uL (ref 4.0–10.5)
nRBC: 0 % (ref 0.0–0.2)

## 2018-11-15 MED ORDER — HEPARIN SOD (PORK) LOCK FLUSH 100 UNIT/ML IV SOLN
500.0000 [IU] | Freq: Once | INTRAVENOUS | Status: AC
  Administered 2018-11-15: 11:00:00 500 [IU]
  Filled 2018-11-15: qty 5

## 2018-11-15 MED ORDER — SODIUM CHLORIDE 0.9% FLUSH
10.0000 mL | Freq: Once | INTRAVENOUS | Status: AC
  Administered 2018-11-15: 11:00:00 10 mL
  Filled 2018-11-15: qty 10

## 2018-11-15 NOTE — Progress Notes (Signed)
[place] [phone]   [phone]  OFFICE PROGRESS NOTE  [name], NP [place] [address]  DIAGNOSIS: stage IV (T1a, N0, M1 a) non-small cell lung cancer, adenocarcinoma presented with multifocal disease in both lungs including 3 nodules in the left lung as well as 2 nodules in the right lung diagnosed in December 2019.  No actionable mutations KRAS G12C BCOR N1425S-subclonal RAD21 amplification  PDL 1 expression 1%.  PRIOR THERAPY:  1) Systemic chemotherapy with carboplatin for AUC of 5, Alimta 500 mg/M2 and Keytruda 200 mg IV every 3 weeks. First dose April 12, 2018. Status post 4 cycles. starting from cycle #3, Keytruda was dropped and patient received treatment with Carboplatin for an AUC of 5 and Alimta 500 mg/m2 IV every 3 weeks. 2) Systemic chemotherapy with Alimta 500 mg/m2 IV every 3 weeks. Status post 3 cycles of single agent Alimta. Most recent dose given on 08/23/2018.  The treatment is currently on hold secondary to renal insufficiency.  CURRENT THERAPY:  Observation.  INTERVAL HISTORY: [name] 63 y.o. female returns to the clinic today for follow-up visit.  The patient is feeling fine today with no concerning complaints.  She was seen by Dr. Moshe Cipro from nephrology and she is now followed by observation.  The patient denied having any current chest pain, shortness of breath, cough or hemoptysis.  She denied having any fever or chills.  She has no nausea, vomiting, diarrhea or constipation.  She has been off treatment since June 2020. She had repeat CT scan of the chest without contrast performed recently and she is here for evaluation and discussion of her scan results.  MEDICAL HISTORY: Past Medical History:  Diagnosis Date  . Anemia    "long time ago" (12/06/2012)  . Anxiety   . Arthritis    "right knee real bad; in my hands bad" (12/06/2012)  . Asthma   . Celiac disease   .  Colon polyps   . COPD (chronic obstructive pulmonary disease) ([place])   . Coronary artery disease    a. s/p Xience DES to [facility] 04/2008;  b. LHC 05/2008: Proximal RCA 25%, mid RCA stent patent.  c. Anormal nuc 2014 -> s/p LHC with severe mRCA stenosis s/p DES. d. Cath 04/2013 s/p DES to LAD.  e. LHC 08/2015 was stable.  . Depression    "years ago" (12/06/2012)  . Fibromyalgia   . Gallstones   . GERD (gastroesophageal reflux disease)   . H/O hiatal hernia   . Hepatitis    "not A, B, or C" (12/06/2012)  . Hyperlipidemia    intol to statins and other chol agents due to elevated LFTs  . Hypertension   . Hypothyroidism   . IBS (irritable bowel syndrome)   . Interstitial cystitis   . Lung cancer ([place]) dx'd 01/2018  . Lung nodules    Bilateral  . Migraines    "when I was younger" (12/06/2012)  . Pneumonia   . PONV (postoperative nausea and vomiting)    "Very bad"  . Premature atrial contractions   . PVC's (premature ventricular contractions)   . Sinus headache   . SVT (supraventricular tachycardia) ([facility])     ALLERGIES:  is allergic to ciprofloxacin; levofloxacin; statins; tricor [fenofibrate]; compazine [prochlorperazine]; latex; codeine; and metoprolol.  MEDICATIONS:  Current Outpatient Medications  Medication Sig Dispense Refill  . albuterol (PROAIR HFA) 108 (90 Base) MCG/ACT inhaler Inhale 1 puff into the lungs  every 6 (six) hours as needed (wheezing/shortness of breath.).     Marland Kitchen albuterol (PROVENTIL) (5 MG/ML) 0.5% nebulizer solution Take 2.5 mg by nebulization every 6 (six) hours as needed for wheezing.    Marland Kitchen amLODipine (NORVASC) 5 MG tablet Take 1.5 tablets (7.5 mg total) by mouth daily. (Patient taking differently: Take 7.5 mg by mouth every evening. ) 180 tablet 3  . aspirin 81 MG chewable tablet Chew 1 tablet (81 mg total) by mouth daily.    . BELSOMRA 20 MG TABS Take 20 mg by mouth at bedtime as needed for sleep.    Marland Kitchen buPROPion (WELLBUTRIN SR) 150 MG 12 hr tablet Take 150 mg by mouth  daily.     . DULoxetine (CYMBALTA) 60 MG capsule Take 120 mg by mouth daily.    . folic acid (FOLVITE) 1 MG tablet Take 1 tablet (1 mg total) by mouth daily. (Patient taking differently: Take 1 mg by mouth every evening. ) 30 tablet 4  . HYDROcodone-acetaminophen (NORCO) 7.5-325 MG per tablet Take 1 tablet by mouth every 6 (six) hours as needed for pain.    Marland Kitchen ipratropium-albuterol (DUONEB) 0.5-2.5 (3) MG/3ML SOLN Inhale 3 mLs into the lungs daily.    Marland Kitchen levothyroxine (SYNTHROID, LEVOTHROID) 25 MCG tablet Take 25 mcg by mouth daily before breakfast.    . lidocaine-prilocaine (EMLA) cream Apply 1 application topically as needed. 30 g 0  . loperamide (IMODIUM) 2 MG capsule Take 1 capsule (2 mg total) by mouth as needed for diarrhea or loose stools (Initiate if stool C diff -ve). (Patient not taking: Reported on 10/11/2018) 30 capsule 0  . LORazepam (ATIVAN) 2 MG tablet Take 2 mg by mouth every evening.     . metoprolol tartrate (LOPRESSOR) 25 MG tablet Take 12.5 mg by mouth 2 (two) times daily.     . montelukast (SINGULAIR) 10 MG tablet Take 10 mg by mouth at bedtime.     . nitroGLYCERIN (NITROSTAT) 0.4 MG SL tablet Place 1 tablet (0.4 mg total) under the tongue every 5 (five) minutes as needed for chest pain. (Patient not taking: Reported on 10/04/2018) 25 tablet 12  . omeprazole (PRILOSEC) 40 MG capsule Take 1 tablet by mouth twice a day for 2 weeks then 1 tablet every morning thereafter (Patient taking differently: Take 40 mg by mouth daily. ) 45 capsule 8  . ondansetron (ZOFRAN ODT) 4 MG disintegrating tablet Take 1 tablet (4 mg total) by mouth every 8 (eight) hours as needed for nausea or vomiting. 20 tablet 0  . ondansetron (ZOFRAN) 8 MG tablet Take 1 tablet (8 mg total) by mouth every 8 (eight) hours as needed for nausea or vomiting. 30 tablet 0  . SYMBICORT 160-4.5 MCG/ACT inhaler Take 2 puffs by mouth 2 (two) times daily.  3  . vitamin B-12 100 MCG tablet Take 1 tablet (100 mcg total) by mouth  daily. (Patient not taking: Reported on 10/04/2018) 30 tablet 1   No current facility-administered medications for this visit.     SURGICAL HISTORY:  Past Surgical History:  Procedure Laterality Date  . CARDIAC CATHETERIZATION  04/2008; 05/2008  . CARDIAC CATHETERIZATION N/A 08/07/2015   Procedure: Left Heart Cath and Coronary Angiography;  Surgeon: Sherren Mocha, MD;  Location: [place] CV LAB;  Service: Cardiovascular;  Laterality: N/A;  . CHOLECYSTECTOMY    . CORONARY ANGIOPLASTY WITH STENT PLACEMENT  04/2008 12/06/2012   "1 + 1" (12/06/2012)  . CORONARY STENT PLACEMENT  05/08/2013   DES TO LAD  DR Angelena Form  . IR IMAGING GUIDED PORT INSERTION  04/29/2018  . LEFT HEART CATHETERIZATION WITH CORONARY ANGIOGRAM N/A 12/06/2012   Procedure: LEFT HEART CATHETERIZATION WITH CORONARY ANGIOGRAM;  Surgeon: [name], MD;  Location: [facility] CATH LAB;  Service: Cardiovascular;  Laterality: N/A;  . LEFT HEART CATHETERIZATION WITH CORONARY ANGIOGRAM N/A 05/08/2013   Procedure: LEFT HEART CATHETERIZATION WITH CORONARY ANGIOGRAM;  Surgeon: [name], MD;  Location: [facility] CATH LAB;  Service: Cardiovascular;  Laterality: N/A;  . LEFT HEART CATHETERIZATION WITH CORONARY ANGIOGRAM N/A 01/04/2014   Procedure: LEFT HEART CATHETERIZATION WITH CORONARY ANGIOGRAM;  Surgeon: [name], MD;  Location: [facility] CATH LAB;  Service: Cardiovascular;  Laterality: N/A;  . PERCUTANEOUS CORONARY STENT INTERVENTION (PCI-S)  12/06/2012   Procedure: PERCUTANEOUS CORONARY STENT INTERVENTION (PCI-S);  Surgeon: [name], MD;  Location: [facility] CATH LAB;  Service: Cardiovascular;;  . PERCUTANEOUS CORONARY STENT INTERVENTION (PCI-S)  05/08/2013   Procedure: PERCUTANEOUS CORONARY STENT INTERVENTION (PCI-S);  Surgeon: [name], MD;  Location: [facility] CATH LAB;  Service: Cardiovascular;;  mid LAD   . TUBAL LIGATION    . VAGINAL HYSTERECTOMY    . VIDEO BRONCHOSCOPY WITH ENDOBRONCHIAL NAVIGATION N/A  02/17/2018   Procedure: VIDEO BRONCHOSCOPY WITH ENDOBRONCHIAL NAVIGATION;  Surgeon: Melrose Nakayama, MD;  Location: [place];  Service: Thoracic;  Laterality: N/A;    REVIEW OF SYSTEMS:  Constitutional: positive for fatigue Eyes: negative Ears, nose, mouth, throat, and face: negative Respiratory: negative Cardiovascular: negative Gastrointestinal: negative Genitourinary:negative Integument/breast: negative Hematologic/lymphatic: negative Musculoskeletal:negative Neurological: negative Behavioral/Psych: negative Endocrine: negative Allergic/Immunologic: negative   PHYSICAL EXAMINATION: General appearance: alert, cooperative, fatigued and no distress Head: Normocephalic, without obvious abnormality, atraumatic Neck: no adenopathy, no JVD, supple, symmetrical, trachea midline and thyroid not enlarged, symmetric, no tenderness/mass/nodules Lymph nodes: Cervical, supraclavicular, and axillary nodes normal. Resp: clear to auscultation bilaterally Back: symmetric, no curvature. ROM normal. No CVA tenderness. Cardio: regular rate and rhythm, S1, S2 normal, no murmur, click, rub or gallop GI: soft, non-tender; bowel sounds normal; no masses,  no organomegaly Extremities: extremities normal, atraumatic, no cyanosis or edema Neurologic: Alert and oriented X 3, normal strength and tone. Normal symmetric reflexes. Normal coordination and gait  ECOG PERFORMANCE STATUS: 1 - Symptomatic but completely ambulatory  Blood pressure 137/88, pulse 76, temperature 98 F (36.7 C), resp. rate 18, height 5' 6"  (1.676 m), weight 152 lb 4.8 oz (69.1 kg), SpO2 100 %.  LABORATORY DATA: Lab Results  Component Value Date   WBC 9.8 11/15/2018   HGB 11.4 (L) 11/15/2018   HCT 34.5 (L) 11/15/2018   MCV 97.2 11/15/2018   PLT 257 11/15/2018      Chemistry      Component Value Date/Time   NA 143 11/01/2018 1103   NA 143 01/10/2018 1629   K 3.2 (L) 11/01/2018 1103   CL 110 11/01/2018 1103   CO2 23  11/01/2018 1103   BUN 18 11/01/2018 1103   BUN 14 01/10/2018 1629   CREATININE 2.19 (H) 11/01/2018 1103   CREATININE 0.68 08/05/2015 1456      Component Value Date/Time   CALCIUM 8.2 (L) 11/01/2018 1103   ALKPHOS 77 11/01/2018 1103   AST 19 11/01/2018 1103   ALT 16 11/01/2018 1103   BILITOT 0.3 11/01/2018 1103       RADIOGRAPHIC STUDIES: Ct Chest Wo Contrast  Result Date: 11/15/2018 CLINICAL DATA:  Restaging lung cancer EXAM: CT CHEST WITHOUT CONTRAST TECHNIQUE: Multidetector CT imaging of the chest was performed following the  standard protocol without IV contrast. COMPARISON:  08/22/2018 FINDINGS: Cardiovascular: Normal heart size. No pericardial effusion. Aortic atherosclerosis. Lad and RCA coronary artery calcifications. Mediastinum/Nodes: No enlarged mediastinal or axillary lymph nodes. Thyroid gland, trachea, and esophagus demonstrate no significant findings. Lungs/Pleura: No pleural effusion identified. Centrilobular and paraseptal emphysema. No airspace consolidation, atelectasis or pneumothorax. As before, scattered ground-glass nodules are noted bilaterally: Right upper lobe index sub solid measures 8 mm, image 47/7. Unchanged. Sub solid nodule within the posterior left lower lobe measures 2.7 cm, image 48/7. Previously 2.6 cm. Sub solid nodule within the subpleural, anterior right upper lobe measures 9 mm, image 58/7. Unchanged. Pure ground-glass attenuating nodule in the posterior left upper lobe lung the oblique fissure measures 1.7 cm, image 78/7. Previously 1.6 cm. Part solid nodule within the superior segment of the left lower lobe measures 1.7 cm with a 6 mm solid component, image 34/7. Previously this measured 1.6 cm with a 6 mm solid component. Other small scattered ground-glass attenuating nodules appears similar to previous exam. New scattered clustered areas of peripheral tree-in-bud nodularity within the right lower lobe are identified likely reflecting a infectious or  inflammatory bronchiolitis. Upper Abdomen: No acute abnormality. Musculoskeletal: No acute or suspicious osseous abnormalities. Unchanged superior endplate compression deformity involving the T5 vertebral body. IMPRESSION: 1. Again noted are multiple bilateral sub solid pulmonary nodules in both lungs. No significant change when compared with study from 08/22/2018. 2. There is several new clustered areas of tree-in-bud nodularity within the right lower likely reflecting infectious or inflammatory bronchiolitis. 3. Aortic Atherosclerosis (ICD10-I70.0) and Emphysema (ICD10-J43.9). 4. Coronary artery atherosclerotic calcifications. Electronically Signed   By: Kerby Moors M.D.   On: 11/15/2018 09:34    ASSESSMENT AND PLAN: This is a very pleasant 63 years old white female recently diagnosed with a stage IV non-small cell lung cancer, adenocarcinoma with no actionable mutations and PDL 1 expression of 1% diagnosed in December 2019 and presented with bilateral pulmonary nodules. The patient was initially started on treatment with carboplatin, Alimta and Keytruda status post 4 cycles but Keytruda was discontinued after cycle #2 secondary to significant skin rash. She was treated with maintenance treatment with single agent Alimta for 3 cycles but this is currently on hold since June 2020 secondary to worsening renal insufficiency. The patient is currently on observation and she is feeling fine. She had repeat CT scan of the chest without contrast performed recently.  I personally and independently reviewed the scan images and discussed the results with the patient today. Her scan showed no concerning findings for disease progression and she has some inflammatory process lobe but the patient is currently asymptomatic. I recommended for the patient to continue on observation with repeat CT scan of the chest without contrast in 3 months. For the renal insufficiency she is followed by nephrology. She was advised  to call immediately if she has any concerning symptoms in the interval. The patient voices understanding of current disease status and treatment options and is in agreement with the current care plan. All questions were answered. The patient knows to call the clinic with any problems, questions or concerns. We can certainly see the patient much sooner if necessary.   Disclaimer: This note was dictated with voice recognition software. Similar sounding words can inadvertently be transcribed and may not be corrected upon review.

## 2018-11-16 ENCOUNTER — Telehealth: Payer: Self-pay | Admitting: Internal Medicine

## 2018-11-16 NOTE — Telephone Encounter (Signed)
Scheduled appt per 9/15 los - mailed letter with appt date and time

## 2018-11-22 ENCOUNTER — Inpatient Hospital Stay: Payer: Managed Care, Other (non HMO)

## 2018-11-22 ENCOUNTER — Inpatient Hospital Stay: Payer: Managed Care, Other (non HMO) | Admitting: Internal Medicine

## 2018-11-22 NOTE — Progress Notes (Signed)
Nutrition Follow-up:  Patient with stage IV NSCLC followed by Dr. Earlie [name].  Planning to see patient during infusion but chemotherapy on hold due to renal function.   Called patient on the phone to completed nutrition visit.  Patient reports that she is eating better.  Continues to drink ensure shakes TID.  Reports that she just eats whatever she feels like eating.  Taste alterations still are effecting her.  Reports that she is trying to eat good sources of protein.     Medications: reviewed  Labs: reviewed  Anthropometrics:   Weight has increased to 152 lb 4.8 oz on 9/15 from 147 lb   NUTRITION DIAGNOSIS:  Unintentional weight loss improving   INTERVENTION:  Encouraged patient to continue with well balanced nutrition Encouraged oral nutrition supplement shakes.  Contact information provided    MONITORING, EVALUATION, GOAL: Patient will increase calories and protein to minimize weight loss, met.    NEXT VISIT: patient to contact if needed  [name] B. [name], [place], [place] Registered Dietitian [phone] (pager)

## 2019-02-08 ENCOUNTER — Encounter: Payer: Self-pay | Admitting: *Deleted

## 2019-02-10 ENCOUNTER — Other Ambulatory Visit: Payer: Self-pay

## 2019-02-10 ENCOUNTER — Ambulatory Visit (HOSPITAL_COMMUNITY)
Admission: RE | Admit: 2019-02-10 | Discharge: 2019-02-10 | Disposition: A | Discharge status: Home / Self Care | Payer: Managed Care, Other (non HMO) | Source: Ambulatory Visit | Attending: Internal Medicine | Admitting: Internal Medicine

## 2019-02-10 DIAGNOSIS — C3490 Malignant neoplasm of unspecified part of unspecified bronchus or lung: Secondary | ICD-10-CM | POA: Insufficient documentation

## 2019-02-13 ENCOUNTER — Inpatient Hospital Stay: Payer: Managed Care, Other (non HMO)

## 2019-02-14 ENCOUNTER — Other Ambulatory Visit: Payer: Self-pay

## 2019-02-14 ENCOUNTER — Inpatient Hospital Stay: Payer: Managed Care, Other (non HMO) | Attending: Internal Medicine

## 2019-02-14 DIAGNOSIS — K219 Gastro-esophageal reflux disease without esophagitis: Secondary | ICD-10-CM | POA: Insufficient documentation

## 2019-02-14 DIAGNOSIS — I2510 Atherosclerotic heart disease of native coronary artery without angina pectoris: Secondary | ICD-10-CM | POA: Diagnosis not present

## 2019-02-14 DIAGNOSIS — Z9225 Personal history of immunosupression therapy: Secondary | ICD-10-CM | POA: Insufficient documentation

## 2019-02-14 DIAGNOSIS — I10 Essential (primary) hypertension: Secondary | ICD-10-CM | POA: Diagnosis not present

## 2019-02-14 DIAGNOSIS — R5383 Other fatigue: Secondary | ICD-10-CM | POA: Diagnosis not present

## 2019-02-14 DIAGNOSIS — Z9221 Personal history of antineoplastic chemotherapy: Secondary | ICD-10-CM | POA: Insufficient documentation

## 2019-02-14 DIAGNOSIS — E785 Hyperlipidemia, unspecified: Secondary | ICD-10-CM | POA: Diagnosis not present

## 2019-02-14 DIAGNOSIS — J449 Chronic obstructive pulmonary disease, unspecified: Secondary | ICD-10-CM | POA: Insufficient documentation

## 2019-02-14 DIAGNOSIS — N289 Disorder of kidney and ureter, unspecified: Secondary | ICD-10-CM | POA: Diagnosis not present

## 2019-02-14 DIAGNOSIS — Z452 Encounter for adjustment and management of vascular access device: Secondary | ICD-10-CM | POA: Diagnosis not present

## 2019-02-14 DIAGNOSIS — Z79899 Other long term (current) drug therapy: Secondary | ICD-10-CM | POA: Diagnosis not present

## 2019-02-14 DIAGNOSIS — C3492 Malignant neoplasm of unspecified part of left bronchus or lung: Secondary | ICD-10-CM | POA: Diagnosis not present

## 2019-02-14 DIAGNOSIS — Z7982 Long term (current) use of aspirin: Secondary | ICD-10-CM | POA: Insufficient documentation

## 2019-02-14 DIAGNOSIS — M797 Fibromyalgia: Secondary | ICD-10-CM | POA: Insufficient documentation

## 2019-02-14 DIAGNOSIS — E039 Hypothyroidism, unspecified: Secondary | ICD-10-CM | POA: Diagnosis not present

## 2019-02-14 DIAGNOSIS — C3491 Malignant neoplasm of unspecified part of right bronchus or lung: Secondary | ICD-10-CM | POA: Insufficient documentation

## 2019-02-14 DIAGNOSIS — Z7951 Long term (current) use of inhaled steroids: Secondary | ICD-10-CM | POA: Diagnosis not present

## 2019-02-14 DIAGNOSIS — R112 Nausea with vomiting, unspecified: Secondary | ICD-10-CM | POA: Insufficient documentation

## 2019-02-14 DIAGNOSIS — C3490 Malignant neoplasm of unspecified part of unspecified bronchus or lung: Secondary | ICD-10-CM

## 2019-02-14 LAB — CBC WITH DIFFERENTIAL (CANCER CENTER ONLY)
Abs Immature Granulocytes: 0.03 10*3/uL (ref 0.00–0.07)
Basophils Absolute: 0 10*3/uL (ref 0.0–0.1)
Basophils Relative: 0 %
Eosinophils Absolute: 0.2 10*3/uL (ref 0.0–0.5)
Eosinophils Relative: 2 %
HCT: 40.5 % (ref 36.0–46.0)
Hemoglobin: 13.3 g/dL (ref 12.0–15.0)
Immature Granulocytes: 0 %
Lymphocytes Relative: 21 %
Lymphs Abs: 1.7 10*3/uL (ref 0.7–4.0)
MCH: 31.9 pg (ref 26.0–34.0)
MCHC: 32.8 g/dL (ref 30.0–36.0)
MCV: 97.1 fL (ref 80.0–100.0)
Monocytes Absolute: 0.5 10*3/uL (ref 0.1–1.0)
Monocytes Relative: 6 %
Neutro Abs: 5.7 10*3/uL (ref 1.7–7.7)
Neutrophils Relative %: 71 %
Platelet Count: 211 10*3/uL (ref 150–400)
RBC: 4.17 MIL/uL (ref 3.87–5.11)
RDW: 12.1 % (ref 11.5–15.5)
WBC Count: 8.1 10*3/uL (ref 4.0–10.5)
nRBC: 0 % (ref 0.0–0.2)

## 2019-02-14 LAB — CMP (CANCER CENTER ONLY)
ALT: 16 U/L (ref 0–44)
AST: 13 U/L — ABNORMAL LOW (ref 15–41)
Albumin: 3.8 g/dL (ref 3.5–5.0)
Alkaline Phosphatase: 108 U/L (ref 38–126)
Anion gap: 11 (ref 5–15)
BUN: 20 mg/dL (ref 8–23)
CO2: 23 mmol/L (ref 22–32)
Calcium: 9.2 mg/dL (ref 8.9–10.3)
Chloride: 108 mmol/L (ref 98–111)
Creatinine: 1.33 mg/dL — ABNORMAL HIGH (ref 0.44–1.00)
GFR, Est AFR Am: 49 mL/min — ABNORMAL LOW (ref >60)
GFR, Estimated: 42 mL/min — ABNORMAL LOW (ref >60)
Glucose, Bld: 155 mg/dL — ABNORMAL HIGH (ref 70–99)
Potassium: 3.9 mmol/L (ref 3.5–5.1)
Sodium: 142 mmol/L (ref 135–145)
Total Bilirubin: 0.3 mg/dL (ref 0.3–1.2)
Total Protein: 7.3 g/dL (ref 6.5–8.1)

## 2019-02-15 ENCOUNTER — Telehealth: Payer: Self-pay | Admitting: Internal Medicine

## 2019-02-15 ENCOUNTER — Ambulatory Visit: Payer: Managed Care, Other (non HMO) | Admitting: Internal Medicine

## 2019-02-15 NOTE — Telephone Encounter (Signed)
Returned patient's phone call regarding rescheduling an appointment, per patient's request appointment has moved to 12/24.

## 2019-02-23 ENCOUNTER — Other Ambulatory Visit: Payer: Self-pay | Admitting: Physician Assistant

## 2019-02-23 ENCOUNTER — Encounter: Payer: Self-pay | Admitting: Internal Medicine

## 2019-02-23 ENCOUNTER — Other Ambulatory Visit: Payer: Self-pay

## 2019-02-23 ENCOUNTER — Inpatient Hospital Stay (HOSPITAL_BASED_OUTPATIENT_CLINIC_OR_DEPARTMENT_OTHER): Payer: Managed Care, Other (non HMO) | Admitting: Internal Medicine

## 2019-02-23 ENCOUNTER — Ambulatory Visit: Payer: Managed Care, Other (non HMO)

## 2019-02-23 VITALS — BP 138/98 | HR 116 | Temp 98.3°F | Resp 18

## 2019-02-23 DIAGNOSIS — C3490 Malignant neoplasm of unspecified part of unspecified bronchus or lung: Secondary | ICD-10-CM

## 2019-02-23 DIAGNOSIS — Z95828 Presence of other vascular implants and grafts: Secondary | ICD-10-CM

## 2019-02-23 DIAGNOSIS — C3492 Malignant neoplasm of unspecified part of left bronchus or lung: Secondary | ICD-10-CM

## 2019-02-23 MED ORDER — SODIUM CHLORIDE 0.9% FLUSH
10.0000 mL | Freq: Once | INTRAVENOUS | Status: AC
  Administered 2019-02-23: 10 mL
  Filled 2019-02-23: qty 10

## 2019-02-23 MED ORDER — HEPARIN SOD (PORK) LOCK FLUSH 100 UNIT/ML IV SOLN
500.0000 [IU] | Freq: Once | INTRAVENOUS | Status: AC
  Administered 2019-02-23: 500 [IU]
  Filled 2019-02-23: qty 5

## 2019-02-23 NOTE — Progress Notes (Signed)
[place] [phone]   [phone]  OFFICE PROGRESS NOTE  [name], NP [place] [address]  DIAGNOSIS: stage IV (T1a, N0, M1 a) non-small cell lung cancer, adenocarcinoma presented with multifocal disease in both lungs including 3 nodules in the left lung as well as 2 nodules in the right lung diagnosed in December 2019.  No actionable mutations KRAS G12C BCOR N1425S-subclonal RAD21 amplification  PDL 1 expression 1%.  PRIOR THERAPY:  1) Systemic chemotherapy with carboplatin for AUC of 5, Alimta 500 mg/M2 and Keytruda 200 mg IV every 3 weeks. First dose April 12, 2018. Status post 4 cycles. starting from cycle #3, Keytruda was dropped and patient received treatment with Carboplatin for an AUC of 5 and Alimta 500 mg/m2 IV every 3 weeks. 2) Systemic chemotherapy with Alimta 500 mg/m2 IV every 3 weeks. Status post 3 cycles of single agent Alimta. Most recent dose given on 08/23/2018.  The treatment is currently on hold secondary to renal insufficiency.  CURRENT THERAPY:  Observation.  INTERVAL HISTORY: [name] 63 y.o. female returns to the clinic today for follow-up visit.  The patient is feeling fine today with no concerning complaints except for fatigue and shortness of breath with exertion.  She also has some intermittent nausea and vomiting.  She denied having any current chest pain, cough or hemoptysis.  She denied having any fever or chills.  She has no diarrhea or constipation.  The patient gained around 10 pounds since her last visit.  She had repeat CT scan of the chest performed recently and she is here for evaluation and discussion of her scan results.  MEDICAL HISTORY: Past Medical History:  Diagnosis Date  . Anemia    "long time ago" (12/06/2012)  . Anxiety   . Arthritis    "right knee real bad; in my hands bad" (12/06/2012)  . Asthma   . Celiac disease   . Colon polyps   . COPD (chronic  obstructive pulmonary disease) ([place])   . Coronary artery disease    a. s/p Xience DES to [facility] 04/2008;  b. LHC 05/2008: Proximal RCA 25%, mid RCA stent patent.  c. Anormal nuc 2014 -> s/p LHC with severe mRCA stenosis s/p DES. d. Cath 04/2013 s/p DES to LAD.  e. LHC 08/2015 was stable.  . Depression    "years ago" (12/06/2012)  . Fibromyalgia   . Gallstones   . GERD (gastroesophageal reflux disease)   . H/O hiatal hernia   . Hepatitis    "not A, B, or C" (12/06/2012)  . Hyperlipidemia    intol to statins and other chol agents due to elevated LFTs  . Hypertension   . Hypothyroidism   . IBS (irritable bowel syndrome)   . Interstitial cystitis   . Lung cancer ([place]) dx'd 01/2018  . Lung nodules    Bilateral  . Migraines    "when I was younger" (12/06/2012)  . Pneumonia   . PONV (postoperative nausea and vomiting)    "Very bad"  . Premature atrial contractions   . PVC's (premature ventricular contractions)   . Sinus headache   . SVT (supraventricular tachycardia) ([facility])     ALLERGIES:  is allergic to ciprofloxacin; levofloxacin; statins; tricor [fenofibrate]; compazine [prochlorperazine]; latex; codeine; and metoprolol.  MEDICATIONS:  Current Outpatient Medications  Medication Sig Dispense Refill  . albuterol (PROAIR HFA) 108 (90 Base) MCG/ACT inhaler Inhale 1 puff into the lungs every 6 (six)  hours as needed (wheezing/shortness of breath.).     Marland Kitchen albuterol (PROVENTIL) (5 MG/ML) 0.5% nebulizer solution Take 2.5 mg by nebulization every 6 (six) hours as needed for wheezing.    Marland Kitchen amLODipine (NORVASC) 5 MG tablet Take 1.5 tablets (7.5 mg total) by mouth daily. (Patient taking differently: Take 7.5 mg by mouth every evening. ) 180 tablet 3  . aspirin 81 MG chewable tablet Chew 1 tablet (81 mg total) by mouth daily.    . BELSOMRA 20 MG TABS Take 20 mg by mouth at bedtime as needed for sleep.    Marland Kitchen buPROPion (WELLBUTRIN SR) 150 MG 12 hr tablet Take 150 mg by mouth daily.     . DULoxetine  (CYMBALTA) 60 MG capsule Take 120 mg by mouth daily.    . folic acid (FOLVITE) 1 MG tablet Take 1 tablet (1 mg total) by mouth daily. (Patient taking differently: Take 1 mg by mouth every evening. ) 30 tablet 4  . HYDROcodone-acetaminophen (NORCO) 7.5-325 MG per tablet Take 1 tablet by mouth every 6 (six) hours as needed for pain.    Marland Kitchen ipratropium-albuterol (DUONEB) 0.5-2.5 (3) MG/3ML SOLN Inhale 3 mLs into the lungs daily.    Marland Kitchen levothyroxine (SYNTHROID, LEVOTHROID) 25 MCG tablet Take 25 mcg by mouth daily before breakfast.    . lidocaine-prilocaine (EMLA) cream Apply 1 application topically as needed. 30 g 0  . loperamide (IMODIUM) 2 MG capsule Take 1 capsule (2 mg total) by mouth as needed for diarrhea or loose stools (Initiate if stool C diff -ve). (Patient not taking: Reported on 10/11/2018) 30 capsule 0  . LORazepam (ATIVAN) 2 MG tablet Take 2 mg by mouth every evening.     . metoprolol tartrate (LOPRESSOR) 25 MG tablet Take 12.5 mg by mouth 2 (two) times daily.     . montelukast (SINGULAIR) 10 MG tablet Take 10 mg by mouth at bedtime.     . nitroGLYCERIN (NITROSTAT) 0.4 MG SL tablet Place 1 tablet (0.4 mg total) under the tongue every 5 (five) minutes as needed for chest pain. (Patient not taking: Reported on 10/04/2018) 25 tablet 12  . omeprazole (PRILOSEC) 40 MG capsule ONE CAPSULE BY MOUTH DAILY 30 capsule 1  . ondansetron (ZOFRAN ODT) 4 MG disintegrating tablet Take 1 tablet (4 mg total) by mouth every 8 (eight) hours as needed for nausea or vomiting. 20 tablet 0  . ondansetron (ZOFRAN) 8 MG tablet Take 1 tablet (8 mg total) by mouth every 8 (eight) hours as needed for nausea or vomiting. 30 tablet 0  . SYMBICORT 160-4.5 MCG/ACT inhaler Take 2 puffs by mouth 2 (two) times daily.  3  . vitamin B-12 100 MCG tablet Take 1 tablet (100 mcg total) by mouth daily. (Patient not taking: Reported on 10/04/2018) 30 tablet 1  . Vitamin D, Ergocalciferol, (DRISDOL) 1.25 MG (50000 UT) CAPS capsule      No  current facility-administered medications for this visit.    SURGICAL HISTORY:  Past Surgical History:  Procedure Laterality Date  . CARDIAC CATHETERIZATION  04/2008; 05/2008  . CARDIAC CATHETERIZATION N/A 08/07/2015   Procedure: Left Heart Cath and Coronary Angiography;  Surgeon: Sherren Mocha, MD;  Location: [facility];  Service: Cardiovascular;  Laterality: N/A;  . CHOLECYSTECTOMY    . CORONARY ANGIOPLASTY WITH STENT PLACEMENT  04/2008 12/06/2012   "1 + 1" (12/06/2012)  . CORONARY STENT PLACEMENT  05/08/2013   DES TO LAD      DR MCALHANY  . IR IMAGING  GUIDED PORT INSERTION  04/29/2018  . LEFT HEART CATHETERIZATION WITH CORONARY ANGIOGRAM N/A 12/06/2012   Procedure: LEFT HEART CATHETERIZATION WITH CORONARY ANGIOGRAM;  Surgeon: [name], MD;  Location: [facility] CATH LAB;  Service: Cardiovascular;  Laterality: N/A;  . LEFT HEART CATHETERIZATION WITH CORONARY ANGIOGRAM N/A 05/08/2013   Procedure: LEFT HEART CATHETERIZATION WITH CORONARY ANGIOGRAM;  Surgeon: [name], MD;  Location: [facility] CATH LAB;  Service: Cardiovascular;  Laterality: N/A;  . LEFT HEART CATHETERIZATION WITH CORONARY ANGIOGRAM N/A 01/04/2014   Procedure: LEFT HEART CATHETERIZATION WITH CORONARY ANGIOGRAM;  Surgeon: [name], MD;  Location: [facility] CATH LAB;  Service: Cardiovascular;  Laterality: N/A;  . PERCUTANEOUS CORONARY STENT INTERVENTION (PCI-S)  12/06/2012   Procedure: PERCUTANEOUS CORONARY STENT INTERVENTION (PCI-S);  Surgeon: [name], MD;  Location: [facility] CATH LAB;  Service: Cardiovascular;;  . PERCUTANEOUS CORONARY STENT INTERVENTION (PCI-S)  05/08/2013   Procedure: PERCUTANEOUS CORONARY STENT INTERVENTION (PCI-S);  Surgeon: [name], MD;  Location: [facility] CATH LAB;  Service: Cardiovascular;;  mid LAD   . TUBAL LIGATION    . VAGINAL HYSTERECTOMY    . VIDEO BRONCHOSCOPY WITH ENDOBRONCHIAL NAVIGATION N/A 02/17/2018   Procedure: VIDEO BRONCHOSCOPY WITH ENDOBRONCHIAL  NAVIGATION;  Surgeon: Melrose Nakayama, MD;  Location: [place];  Service: Thoracic;  Laterality: N/A;    REVIEW OF SYSTEMS:  A comprehensive review of systems was negative except for: Constitutional: positive for fatigue Respiratory: positive for dyspnea on exertion Gastrointestinal: positive for nausea   PHYSICAL EXAMINATION: General appearance: alert, cooperative, fatigued and no distress Head: Normocephalic, without obvious abnormality, atraumatic Neck: no adenopathy, no JVD, supple, symmetrical, trachea midline and thyroid not enlarged, symmetric, no tenderness/mass/nodules Lymph nodes: Cervical, supraclavicular, and axillary nodes normal. Resp: clear to auscultation bilaterally Back: symmetric, no curvature. ROM normal. No CVA tenderness. Cardio: regular rate and rhythm, S1, S2 normal, no murmur, click, rub or gallop GI: soft, non-tender; bowel sounds normal; no masses,  no organomegaly Extremities: extremities normal, atraumatic, no cyanosis or edema  ECOG PERFORMANCE STATUS: 1 - Symptomatic but completely ambulatory  Blood pressure (!) 137/97, pulse (!) 119, temperature 98.3 F (36.8 C), temperature source Temporal, resp. rate 18, height 5' 6"  (1.676 m), weight 162 lb (73.5 kg), SpO2 100 %.  LABORATORY DATA: Lab Results  Component Value Date   WBC 8.1 02/14/2019   HGB 13.3 02/14/2019   HCT 40.5 02/14/2019   MCV 97.1 02/14/2019   PLT 211 02/14/2019      Chemistry      Component Value Date/Time   NA 142 02/14/2019 1407   NA 143 01/10/2018 1629   K 3.9 02/14/2019 1407   CL 108 02/14/2019 1407   CO2 23 02/14/2019 1407   BUN 20 02/14/2019 1407   BUN 14 01/10/2018 1629   CREATININE 1.33 (H) 02/14/2019 1407   CREATININE 0.68 08/05/2015 1456      Component Value Date/Time   CALCIUM 9.2 02/14/2019 1407   ALKPHOS 108 02/14/2019 1407   AST 13 (L) 02/14/2019 1407   ALT 16 02/14/2019 1407   BILITOT 0.3 02/14/2019 1407       RADIOGRAPHIC STUDIES: CT Chest Wo  Contrast  Result Date: 02/10/2019 CLINICAL DATA:  Lung cancer diagnosed December 2019. Renal insufficiency. EXAM: CT CHEST WITHOUT CONTRAST TECHNIQUE: Multidetector CT imaging of the chest was performed following the standard protocol without IV contrast. COMPARISON:  CT 11/15/2018 FINDINGS: Cardiovascular: Port in the anterior chest wall with tip in distal SVC. Coronary artery calcification and aortic atherosclerotic calcification.  Mediastinum/Nodes: No axillary supraclavicular adenopathy. No mediastinal hilar adenopathy no pericardial effusion. Esophagus normal Lungs/Pleura: Again demonstrated bilateral sub solid pulmonary nodule not changed from prior. Example nodule in the RIGHT upper lobe measures 8 mm (image 51/5) compared to 8 mm on prior. Sub solid nodule in the LEFT lower lobe along the pleural surface measures 23 mm (image 56/5) compared with 27 mm. Stable nodule along the LEFT the oblique fissure in LEFT upper lobe (image 83/5). No new pulmonary nodules are identified. Upper Abdomen: Limited view of the liver, kidneys, pancreas are unremarkable. Normal adrenal glands. Musculoskeletal: No aggressive osseous lesion. IMPRESSION: 1. Bilateral sub solid and ground-glass pulmonary nodules not changed from comparison CT. Recommend continued surveillance. 2. No lymphadenopathy Electronically Signed   By: Suzy Bouchard M.D.   On: 02/10/2019 14:23    ASSESSMENT AND PLAN: This is a very pleasant 63 years old white female recently diagnosed with a stage IV non-small cell lung cancer, adenocarcinoma with no actionable mutations and PDL 1 expression of 1% diagnosed in December 2019 and presented with bilateral pulmonary nodules. The patient was initially started on treatment with carboplatin, Alimta and Keytruda status post 4 cycles but Keytruda was discontinued after cycle #2 secondary to significant skin rash. She was treated with maintenance treatment with single agent Alimta for 3 cycles but this is  currently on hold since June 2020 secondary to worsening renal insufficiency. The patient has been on observation since that time and she is feeling fine with no concerning complaints.  She had improvement in her renal function. She had repeat CT scan of the chest performed recently.  I personally and independently reviewed the scan images and discussed the result and showed the images to the patient today. Her scan showed no concerning findings for disease progression. I recommended for the patient to continue on observation with repeat CT scan of the chest without contrast in 4 months. We will arrange for the patient to have Port-A-Cath flush every 2 months. She was advised to call immediately if she has any concerning symptoms in the interval. The patient voices understanding of current disease status and treatment options and is in agreement with the current care plan. All questions were answered. The patient knows to call the clinic with any problems, questions or concerns. We can certainly see the patient much sooner if necessary.   Disclaimer: This note was dictated with voice recognition software. Similar sounding words can inadvertently be transcribed and may not be corrected upon review.

## 2019-02-24 ENCOUNTER — Other Ambulatory Visit: Payer: Self-pay | Admitting: Physician Assistant

## 2019-02-28 ENCOUNTER — Telehealth: Payer: Self-pay | Admitting: Internal Medicine

## 2019-02-28 NOTE — Telephone Encounter (Signed)
Scheduled per los. Called and left msg. Mailed printout  °

## 2019-03-05 NOTE — Progress Notes (Signed)
Chief Complaint  Patient presents with  . Follow-up    CAD    History of Present Illness: 64 y.o. female with history of asthma, depression, GERD, fibromyalgia, small cell lung cancer, PVCs, PACs, SVT and CAD who is here today for cardiac follow up. [place] 04/2008 demonstrated 80% stenosis in the mid RCA treated with a Xience DES. Patient was noted to have possible SVT during cardiac rehabilitation 05/2008. Re-look LHC 05/2008: Proximal RCA 25%, mid RCA stent patent. Holter 03/2009: NSR, PACs and PVCs. Liver biopsy 2011 reportedly normal (steatohepatitis). Stress myoview on  11/21/12 showed a reversible basal inferior perfusion defect. She described class III angina on her f/u in our office 12/02/12. Cardiac cath 12/06/12 with severe stenosis in the mid RCA just before the old stent which also had restenosis. I treated the entire segment with a 2.75 x 28 mm Promus Premier DES. This was post-dilated to 3.0 mm. She had recurrent chest pain and was seen in the office 05/05/13. Cardiac cath 05/08/13 with severe stenosis mid LAD. A 2.5 x 16 mm Promus DES was deployed in the mid LAD. She also had c/o palpitations. She called our office in August 2015 c/o tachycardia, heart racing. Lopressor 12.5 mg po BID started. 24 hour monitor November 2015 with PACs/PVCs and 4 beat run SVT.  Last cath June 2017 with patent stents in the LAD and RCA. LVEF was normal. Shewas seen by Melina Copa, PA-C on 10/06/2016 and complained of exertional dyspnea. Subsequently she was ordered to undergo a nuclear stressto assess for ischemia. This was done08/10/2018and showed no evidence of ischemia or prior infarction. EF was noted to bemildlydecreased at 48%.Echo August 2018 showed normal left ventricular EF at 32-99%, grade1 diastolic dysfunction was noted. Wall motion was normal. No significant valvular disease was noted. She also ordered to get labs given her dyspnea. Pro BNP was normal. CBC negative for anemia. Nuclear stress test  November 2019 with no ischemia. Cardiac monitor November 2019 with short runs of SVT. She has been diagnosed with small cell lung cancer in December 2019 and has undergone chemotherapy.   She is here today for follow up. The patient denies any chest pain, palpitations, lower extremity edema, orthopnea, PND, dizziness, near syncope or syncope. Ongoing dyspnea.   Primary Care Physician: [name], NP   Past Medical History:  Diagnosis Date  . Anemia    "long time ago" (12/06/2012)  . Anxiety   . Arthritis    "right knee real bad; in my hands bad" (12/06/2012)  . Asthma   . Celiac disease   . Colon polyps   . COPD (chronic obstructive pulmonary disease) ([place])   . Coronary artery disease    a. s/p Xience DES to [facility] 04/2008;  b. LHC 05/2008: Proximal RCA 25%, mid RCA stent patent.  c. Anormal nuc 2014 -> s/p LHC with severe mRCA stenosis s/p DES. d. Cath 04/2013 s/p DES to LAD.  e. LHC 08/2015 was stable.  . Depression    "years ago" (12/06/2012)  . Fibromyalgia   . Gallstones   . GERD (gastroesophageal reflux disease)   . H/O hiatal hernia   . Hepatitis    "not A, B, or C" (12/06/2012)  . Hyperlipidemia    intol to statins and other chol agents due to elevated LFTs  . Hypertension   . Hypothyroidism   . IBS (irritable bowel syndrome)   . Interstitial cystitis   . Lung cancer ([place]) dx'd 01/2018  . Lung nodules  Bilateral  . Migraines    "when I was younger" (12/06/2012)  . Pneumonia   . PONV (postoperative nausea and vomiting)    "Very bad"  . Premature atrial contractions   . PVC's (premature ventricular contractions)   . Sinus headache   . SVT (supraventricular tachycardia) ([facility])     Past Surgical History:  Procedure Laterality Date  . CARDIAC CATHETERIZATION  04/2008; 05/2008  . CARDIAC CATHETERIZATION N/A 08/07/2015   Procedure: Left Heart Cath and Coronary Angiography;  Surgeon: Sherren Mocha, MD;  Location: [place] [facility];  Service: Cardiovascular;   Laterality: N/A;  . CHOLECYSTECTOMY    . CORONARY ANGIOPLASTY WITH STENT PLACEMENT  04/2008 12/06/2012   "1 + 1" (12/06/2012)  . CORONARY STENT PLACEMENT  05/08/2013   DES TO LAD      DR Trenita Hulme  . IR IMAGING GUIDED PORT INSERTION  04/29/2018  . LEFT HEART CATHETERIZATION WITH CORONARY ANGIOGRAM N/A 12/06/2012   Procedure: LEFT HEART CATHETERIZATION WITH CORONARY ANGIOGRAM;  Surgeon: [name], MD;  Location: [facility] CATH LAB;  Service: Cardiovascular;  Laterality: N/A;  . LEFT HEART CATHETERIZATION WITH CORONARY ANGIOGRAM N/A 05/08/2013   Procedure: LEFT HEART CATHETERIZATION WITH CORONARY ANGIOGRAM;  Surgeon: [name], MD;  Location: [facility] LAB;  Service: Cardiovascular;  Laterality: N/A;  . LEFT HEART CATHETERIZATION WITH CORONARY ANGIOGRAM N/A 01/04/2014   Procedure: LEFT HEART CATHETERIZATION WITH CORONARY ANGIOGRAM;  Surgeon: [name], MD;  Location: [facility] CATH LAB;  Service: Cardiovascular;  Laterality: N/A;  . PERCUTANEOUS CORONARY STENT INTERVENTION (PCI-S)  12/06/2012   Procedure: PERCUTANEOUS CORONARY STENT INTERVENTION (PCI-S);  Surgeon: [name], MD;  Location: [facility] CATH LAB;  Service: Cardiovascular;;  . PERCUTANEOUS CORONARY STENT INTERVENTION (PCI-S)  05/08/2013   Procedure: PERCUTANEOUS CORONARY STENT INTERVENTION (PCI-S);  Surgeon: [name], MD;  Location: [facility] CATH LAB;  Service: Cardiovascular;;  mid LAD   . TUBAL LIGATION    . VAGINAL HYSTERECTOMY    . VIDEO BRONCHOSCOPY WITH ENDOBRONCHIAL NAVIGATION N/A 02/17/2018   Procedure: VIDEO BRONCHOSCOPY WITH ENDOBRONCHIAL NAVIGATION;  Surgeon: Melrose Nakayama, MD;  Location: [place];  Service: Thoracic;  Laterality: N/A;    Current Outpatient Medications  Medication Sig Dispense Refill  . albuterol (PROAIR HFA) 108 (90 Base) MCG/ACT inhaler Inhale 1 puff into the lungs every 6 (six) hours as needed (wheezing/shortness of breath.).     Marland Kitchen albuterol (PROVENTIL) (5 MG/ML) 0.5%  nebulizer solution Take 2.5 mg by nebulization every 6 (six) hours as needed for wheezing.    Marland Kitchen amLODipine (NORVASC) 5 MG tablet Take 1.5 tablets (7.5 mg total) by mouth daily. 180 tablet 3  . aspirin 81 MG chewable tablet Chew 1 tablet (81 mg total) by mouth daily.    . BELSOMRA 20 MG TABS Take 20 mg by mouth at bedtime as needed for sleep.    Marland Kitchen buPROPion (WELLBUTRIN SR) 150 MG 12 hr tablet Take 150 mg by mouth daily.     . DULoxetine (CYMBALTA) 60 MG capsule Take 120 mg by mouth daily.    . folic acid (FOLVITE) 1 MG tablet Take 1 tablet (1 mg total) by mouth daily. 30 tablet 4  . HYDROcodone-acetaminophen (NORCO) 7.5-325 MG per tablet Take 1 tablet by mouth every 6 (six) hours as needed for pain.    Marland Kitchen ipratropium-albuterol (DUONEB) 0.5-2.5 (3) MG/3ML SOLN Inhale 3 mLs into the lungs daily.    Marland Kitchen levothyroxine (SYNTHROID, LEVOTHROID) 25 MCG tablet Take 25 mcg by mouth daily before  breakfast.    . lidocaine-prilocaine (EMLA) cream Apply 1 application topically as needed. 30 g 0  . loperamide (IMODIUM) 2 MG capsule Take 1 capsule (2 mg total) by mouth as needed for diarrhea or loose stools (Initiate if stool C diff -ve). 30 capsule 0  . LORazepam (ATIVAN) 2 MG tablet Take 2 mg by mouth every evening.     . metoprolol tartrate (LOPRESSOR) 25 MG tablet Take 12.5 mg by mouth 2 (two) times daily.     . montelukast (SINGULAIR) 10 MG tablet Take 10 mg by mouth at bedtime.     . nitroGLYCERIN (NITROSTAT) 0.4 MG SL tablet Place 1 tablet (0.4 mg total) under the tongue every 5 (five) minutes as needed for chest pain. 25 tablet 12  . omeprazole (PRILOSEC) 40 MG capsule You will be due for a yearly follow up visit in March 2021. 90 capsule 0  . ondansetron (ZOFRAN ODT) 4 MG disintegrating tablet Take 1 tablet (4 mg total) by mouth every 8 (eight) hours as needed for nausea or vomiting. 20 tablet 0  . ondansetron (ZOFRAN) 8 MG tablet Take 1 tablet (8 mg total) by mouth every 8 (eight) hours as needed for nausea  or vomiting. 30 tablet 0  . SYMBICORT 160-4.5 MCG/ACT inhaler Take 2 puffs by mouth 2 (two) times daily.  3  . vitamin B-12 100 MCG tablet Take 1 tablet (100 mcg total) by mouth daily. 30 tablet 1  . Vitamin D, Ergocalciferol, (DRISDOL) 1.25 MG (50000 UT) CAPS capsule      No current facility-administered medications for this visit.    Allergies  Allergen Reactions  . Ciprofloxacin Hives  . Levofloxacin     UNSPECIFIED REACTION, BUT LIKELY HIVES > HIVES WITH CIPRO  . Statins Other (See Comments)    Liver enzymes Liver enzymes  . Tricor [Fenofibrate] Hives and Itching  . Compazine [Prochlorperazine]     Hx of tardive dyskinesia with compazine  . Latex Other (See Comments)    Patient states "Messes with my skin."  . Codeine Nausea And Vomiting  . Metoprolol Other (See Comments)    Does not tolerate beta blockers 10/02/2013-pt states she can tolerate 25 mg and lower     Social History   Socioeconomic History  . Marital status: Married    Spouse name: Not on file  . Number of children: 4  . Years of education: Not on file  . Highest education level: Not on file  Occupational History  . Not on file  Tobacco Use  . Smoking status: Former Smoker    Packs/day: 0.50    Years: 30.00    Pack years: 15.00    Types: Cigarettes    Quit date: 2009    Years since quitting: 12.0  . Smokeless tobacco: Never Used  . Tobacco comment: 12/06/2012 "quit smoking cigarettes ~ 8 yr ago"  Substance and Sexual Activity  . Alcohol use: No  . Drug use: No  . Sexual activity: Yes  Other Topics Concern  . Not on file  Social History Narrative  . Not on file   Social Determinants of Health   Financial Resource Strain:   . Difficulty of Paying Living Expenses: Not on file  Food Insecurity:   . Worried About Charity fundraiser in the Last Year: Not on file  . Ran Out of Food in the Last Year: Not on file  Transportation Needs:   . Lack of Transportation (Medical): Not on file  . Lack  of  Transportation (Non-Medical): Not on file  Physical Activity:   . Days of Exercise per Week: Not on file  . Minutes of Exercise per Session: Not on file  Stress:   . Feeling of Stress : Not on file  Social Connections:   . Frequency of Communication with Friends and Family: Not on file  . Frequency of Social Gatherings with Friends and Family: Not on file  . Attends Religious Services: Not on file  . Active Member of Clubs or Organizations: Not on file  . Attends Archivist Meetings: Not on file  . Marital Status: Not on file  Intimate Partner Violence:   . Fear of Current or Ex-Partner: Not on file  . Emotionally Abused: Not on file  . Physically Abused: Not on file  . Sexually Abused: Not on file    Family History: No CAD  Review of Systems:  As stated in the HPI and otherwise negative.   BP 118/84   Pulse (!) 113 Comment: notified nurse about pulse  Ht 5' 7"  (1.702 m)   Wt 165 lb (74.8 kg)   SpO2 97%   BMI 25.84 kg/m   Physical Examination: General: Well developed, well nourished, NAD  HEENT: OP clear, mucus membranes moist  SKIN: warm, dry. No rashes. Neuro: No focal deficits  Musculoskeletal: Muscle strength 5/5 all ext  Psychiatric: Mood and affect normal  Neck: No JVD, no carotid bruits, no thyromegaly, no lymphadenopathy.  Lungs:Clear bilaterally, no wheezes, rhonci, crackles Cardiovascular: Regular rhythm. Tachy. No murmurs, gallops or rubs. Abdomen:Soft. Bowel sounds present. Non-tender.  Extremities: No lower extremity edema. Pulses are 2 + in the bilateral DP/PT.  EKG:  EKG is not ordered today. The ekg ordered today demonstrates   Recent Labs: 06/14/2018: TSH 0.544 02/14/2019: ALT 16; BUN 20; Creatinine 1.33; Hemoglobin 13.3; Platelet Count 211; Potassium 3.9; Sodium 142   Lipid Panel     Component Value Date/Time   CHOL 284 (H) 02/01/2018 1539   TRIG 248 (H) 02/01/2018 1539   HDL 51 02/01/2018 1539   CHOLHDL 5.6 (H) 02/01/2018 1539    CHOLHDL 4.2 08/22/2015 1408   VLDL 37 (H) 08/22/2015 1408   LDLCALC 183 (H) 02/01/2018 1539   LDLDIRECT 198 (H) 02/01/2018 1539   LDLDIRECT 186 (H) 08/22/2015 1408   LABVLDL 50 (H) 02/01/2018 1539     Wt Readings from Last 3 Encounters:  03/06/19 165 lb (74.8 kg)  02/23/19 162 lb (73.5 kg)  11/15/18 152 lb 4.8 oz (69.1 kg)     Other studies Reviewed: Additional studies/ records that were reviewed today include: . Review of the above records demonstrates:    Assessment and Plan:   1. CAD without angina: Last cath in 2017 with patent LAD and RCA stents. She has no chest pains that are worrisome for angina.  Continue ASA and beta blocker. She does not tolerate statins.  2. Palpitations/PAC/PVC/sinus tachycardia: Continue beta blocker.   3. Hyperlipidemia: She does not tolerate statins due to muscle pains/weakness and abnormal LFTs. She was seen in the lipid clinic and Repatha/Praluent discussed but she now has advanced lung cancer and she does not wish to pursue this. I agree that we should not push forward with aggressive lipid management given her advanced cancer.   4. Lung cancer. She has completed chemotherapy. Plans for palliation per pt.   Current medicines are reviewed at length with the patient today.  The patient does not have concerns regarding medicines.  The following changes  have been made:  no change  Labs/ tests ordered today include:   No orders of the defined types were placed in this encounter.   Disposition:   FU with me in 12  months  Signed, Lauree Chandler, MD 03/06/2019 4:09 PM    [facility] [address], [place], [place]  [address] Phone: [phone]; Fax: [phone]

## 2019-03-06 ENCOUNTER — Other Ambulatory Visit: Payer: Self-pay

## 2019-03-06 ENCOUNTER — Ambulatory Visit: Payer: Managed Care, Other (non HMO) | Admitting: Cardiovascular Disease

## 2019-03-06 ENCOUNTER — Encounter: Payer: Self-pay | Admitting: Cardiovascular Disease

## 2019-03-06 VITALS — BP 118/84 | HR 113 | Ht 67.0 in | Wt 165.0 lb

## 2019-03-06 DIAGNOSIS — E785 Hyperlipidemia, unspecified: Secondary | ICD-10-CM

## 2019-03-06 DIAGNOSIS — R002 Palpitations: Secondary | ICD-10-CM

## 2019-03-06 DIAGNOSIS — I2510 Atherosclerotic heart disease of native coronary artery without angina pectoris: Secondary | ICD-10-CM | POA: Diagnosis not present

## 2019-03-06 NOTE — Patient Instructions (Signed)
Medication Instructions:  No changes *If you need a refill on your cardiac medications before your next appointment, please call your pharmacy*  Lab Work: none If you have labs (blood work) drawn today and your tests are completely normal, you will receive your results only by: Marland Kitchen MyChart Message (if you have MyChart) OR . A paper copy in the mail If you have any lab test that is abnormal or we need to change your treatment, we will call you to review the results.  Testing/Procedures: none  Follow-Up: At [facility], you and your health needs are our priority.  As part of our continuing mission to provide you with exceptional heart care, we have created designated Provider Care Teams.  These Care Teams include your primary Cardiologist (physician) and Advanced Practice Providers (APPs -  Physician Assistants and Nurse Practitioners) who all work together to provide you with the care you need, when you need it.  Your next appointment:   12 month(s)  The format for your next appointment:   Either In Person or Virtual  Provider:   Lauree Chandler, MD  Other Instructions

## 2019-03-15 ENCOUNTER — Other Ambulatory Visit: Payer: Self-pay | Admitting: Cardiovascular Disease

## 2019-03-15 MED ORDER — METOPROLOL TARTRATE 25 MG PO TABS: 12.5000 mg | ORAL_TABLET | Freq: Two times a day (BID) | ORAL | 3 refills | Status: DC

## 2019-04-26 ENCOUNTER — Other Ambulatory Visit: Payer: Self-pay

## 2019-04-26 ENCOUNTER — Inpatient Hospital Stay: Payer: Managed Care, Other (non HMO) | Attending: Internal Medicine

## 2019-04-26 DIAGNOSIS — C3492 Malignant neoplasm of unspecified part of left bronchus or lung: Secondary | ICD-10-CM | POA: Diagnosis not present

## 2019-04-26 DIAGNOSIS — Z95828 Presence of other vascular implants and grafts: Secondary | ICD-10-CM

## 2019-04-26 DIAGNOSIS — Z452 Encounter for adjustment and management of vascular access device: Secondary | ICD-10-CM | POA: Insufficient documentation

## 2019-04-26 MED ORDER — SODIUM CHLORIDE 0.9% FLUSH
10.0000 mL | Freq: Once | INTRAVENOUS | Status: AC
  Administered 2019-04-26: 14:00:00 10 mL
  Filled 2019-04-26: qty 10

## 2019-04-26 MED ORDER — HEPARIN SOD (PORK) LOCK FLUSH 100 UNIT/ML IV SOLN
500.0000 [IU] | Freq: Once | INTRAVENOUS | Status: AC
  Administered 2019-04-26: 14:00:00 500 [IU]
  Filled 2019-04-26: qty 5

## 2019-06-23 ENCOUNTER — Other Ambulatory Visit: Payer: Self-pay

## 2019-06-23 ENCOUNTER — Inpatient Hospital Stay: Payer: Managed Care, Other (non HMO)

## 2019-06-23 ENCOUNTER — Inpatient Hospital Stay: Payer: Managed Care, Other (non HMO) | Attending: Internal Medicine

## 2019-06-23 ENCOUNTER — Ambulatory Visit (HOSPITAL_COMMUNITY)
Admission: RE | Admit: 2019-06-23 | Discharge: 2019-06-23 | Disposition: A | Discharge status: Home / Self Care | Payer: Managed Care, Other (non HMO) | Source: Ambulatory Visit | Attending: Internal Medicine | Admitting: Internal Medicine

## 2019-06-23 DIAGNOSIS — I10 Essential (primary) hypertension: Secondary | ICD-10-CM | POA: Insufficient documentation

## 2019-06-23 DIAGNOSIS — M797 Fibromyalgia: Secondary | ICD-10-CM | POA: Insufficient documentation

## 2019-06-23 DIAGNOSIS — N289 Disorder of kidney and ureter, unspecified: Secondary | ICD-10-CM | POA: Insufficient documentation

## 2019-06-23 DIAGNOSIS — Z95828 Presence of other vascular implants and grafts: Secondary | ICD-10-CM

## 2019-06-23 DIAGNOSIS — F418 Other specified anxiety disorders: Secondary | ICD-10-CM | POA: Insufficient documentation

## 2019-06-23 DIAGNOSIS — Z79899 Other long term (current) drug therapy: Secondary | ICD-10-CM | POA: Insufficient documentation

## 2019-06-23 DIAGNOSIS — J449 Chronic obstructive pulmonary disease, unspecified: Secondary | ICD-10-CM | POA: Insufficient documentation

## 2019-06-23 DIAGNOSIS — C3490 Malignant neoplasm of unspecified part of unspecified bronchus or lung: Secondary | ICD-10-CM | POA: Diagnosis not present

## 2019-06-23 DIAGNOSIS — R110 Nausea: Secondary | ICD-10-CM | POA: Insufficient documentation

## 2019-06-23 DIAGNOSIS — R5383 Other fatigue: Secondary | ICD-10-CM | POA: Insufficient documentation

## 2019-06-23 DIAGNOSIS — C3492 Malignant neoplasm of unspecified part of left bronchus or lung: Secondary | ICD-10-CM

## 2019-06-23 DIAGNOSIS — R000 Tachycardia, unspecified: Secondary | ICD-10-CM | POA: Insufficient documentation

## 2019-06-23 DIAGNOSIS — E039 Hypothyroidism, unspecified: Secondary | ICD-10-CM | POA: Insufficient documentation

## 2019-06-23 DIAGNOSIS — I2510 Atherosclerotic heart disease of native coronary artery without angina pectoris: Secondary | ICD-10-CM | POA: Insufficient documentation

## 2019-06-23 DIAGNOSIS — K589 Irritable bowel syndrome without diarrhea: Secondary | ICD-10-CM | POA: Insufficient documentation

## 2019-06-23 DIAGNOSIS — Z7982 Long term (current) use of aspirin: Secondary | ICD-10-CM | POA: Insufficient documentation

## 2019-06-23 DIAGNOSIS — Z9221 Personal history of antineoplastic chemotherapy: Secondary | ICD-10-CM | POA: Insufficient documentation

## 2019-06-23 DIAGNOSIS — K219 Gastro-esophageal reflux disease without esophagitis: Secondary | ICD-10-CM | POA: Insufficient documentation

## 2019-06-23 DIAGNOSIS — K900 Celiac disease: Secondary | ICD-10-CM | POA: Insufficient documentation

## 2019-06-23 LAB — CBC WITH DIFFERENTIAL (CANCER CENTER ONLY)
Abs Immature Granulocytes: 0.04 10*3/uL (ref 0.00–0.07)
Basophils Absolute: 0 10*3/uL (ref 0.0–0.1)
Basophils Relative: 1 %
Eosinophils Absolute: 0.1 10*3/uL (ref 0.0–0.5)
Eosinophils Relative: 2 %
HCT: 35.6 % — ABNORMAL LOW (ref 36.0–46.0)
Hemoglobin: 11.8 g/dL — ABNORMAL LOW (ref 12.0–15.0)
Immature Granulocytes: 1 %
Lymphocytes Relative: 23 %
Lymphs Abs: 1.9 10*3/uL (ref 0.7–4.0)
MCH: 31.1 pg (ref 26.0–34.0)
MCHC: 33.1 g/dL (ref 30.0–36.0)
MCV: 93.7 fL (ref 80.0–100.0)
Monocytes Absolute: 0.7 10*3/uL (ref 0.1–1.0)
Monocytes Relative: 8 %
Neutro Abs: 5.3 10*3/uL (ref 1.7–7.7)
Neutrophils Relative %: 65 %
Platelet Count: 198 10*3/uL (ref 150–400)
RBC: 3.8 MIL/uL — ABNORMAL LOW (ref 3.87–5.11)
RDW: 13.1 % (ref 11.5–15.5)
WBC Count: 8 10*3/uL (ref 4.0–10.5)
nRBC: 0 % (ref 0.0–0.2)

## 2019-06-23 LAB — CMP (CANCER CENTER ONLY)
ALT: 11 U/L (ref 0–44)
AST: 11 U/L — ABNORMAL LOW (ref 15–41)
Albumin: 3.9 g/dL (ref 3.5–5.0)
Alkaline Phosphatase: 95 U/L (ref 38–126)
Anion gap: 12 (ref 5–15)
BUN: 18 mg/dL (ref 8–23)
CO2: 22 mmol/L (ref 22–32)
Calcium: 8.8 mg/dL — ABNORMAL LOW (ref 8.9–10.3)
Chloride: 110 mmol/L (ref 98–111)
Creatinine: 1.3 mg/dL — ABNORMAL HIGH (ref 0.44–1.00)
GFR, Est AFR Am: 50 mL/min — ABNORMAL LOW (ref >60)
GFR, Estimated: 43 mL/min — ABNORMAL LOW (ref >60)
Glucose, Bld: 105 mg/dL — ABNORMAL HIGH (ref 70–99)
Potassium: 3.8 mmol/L (ref 3.5–5.1)
Sodium: 144 mmol/L (ref 135–145)
Total Bilirubin: 0.4 mg/dL (ref 0.3–1.2)
Total Protein: 7 g/dL (ref 6.5–8.1)

## 2019-06-23 MED ORDER — HEPARIN SOD (PORK) LOCK FLUSH 100 UNIT/ML IV SOLN
500.0000 [IU] | Freq: Once | INTRAVENOUS | Status: AC
  Administered 2019-06-23: 500 [IU]
  Filled 2019-06-23: qty 5

## 2019-06-23 MED ORDER — SODIUM CHLORIDE 0.9% FLUSH
10.0000 mL | Freq: Once | INTRAVENOUS | Status: AC
  Administered 2019-06-23: 10 mL
  Filled 2019-06-23: qty 10

## 2019-06-26 ENCOUNTER — Encounter: Payer: Self-pay | Admitting: Internal Medicine

## 2019-06-26 ENCOUNTER — Other Ambulatory Visit: Payer: Self-pay

## 2019-06-26 ENCOUNTER — Inpatient Hospital Stay (HOSPITAL_BASED_OUTPATIENT_CLINIC_OR_DEPARTMENT_OTHER): Payer: Managed Care, Other (non HMO) | Admitting: Internal Medicine

## 2019-06-26 VITALS — BP 142/96 | HR 88 | Temp 99.1°F | Resp 19 | Ht 67.0 in | Wt 171.7 lb

## 2019-06-26 DIAGNOSIS — R110 Nausea: Secondary | ICD-10-CM | POA: Diagnosis not present

## 2019-06-26 DIAGNOSIS — C3492 Malignant neoplasm of unspecified part of left bronchus or lung: Secondary | ICD-10-CM

## 2019-06-26 DIAGNOSIS — K589 Irritable bowel syndrome without diarrhea: Secondary | ICD-10-CM | POA: Diagnosis not present

## 2019-06-26 DIAGNOSIS — I2510 Atherosclerotic heart disease of native coronary artery without angina pectoris: Secondary | ICD-10-CM | POA: Diagnosis not present

## 2019-06-26 DIAGNOSIS — F418 Other specified anxiety disorders: Secondary | ICD-10-CM | POA: Diagnosis not present

## 2019-06-26 DIAGNOSIS — M797 Fibromyalgia: Secondary | ICD-10-CM | POA: Diagnosis not present

## 2019-06-26 DIAGNOSIS — K219 Gastro-esophageal reflux disease without esophagitis: Secondary | ICD-10-CM | POA: Diagnosis not present

## 2019-06-26 DIAGNOSIS — N289 Disorder of kidney and ureter, unspecified: Secondary | ICD-10-CM | POA: Diagnosis not present

## 2019-06-26 DIAGNOSIS — C3490 Malignant neoplasm of unspecified part of unspecified bronchus or lung: Secondary | ICD-10-CM

## 2019-06-26 DIAGNOSIS — Z7982 Long term (current) use of aspirin: Secondary | ICD-10-CM | POA: Diagnosis not present

## 2019-06-26 DIAGNOSIS — R5383 Other fatigue: Secondary | ICD-10-CM | POA: Diagnosis not present

## 2019-06-26 DIAGNOSIS — J449 Chronic obstructive pulmonary disease, unspecified: Secondary | ICD-10-CM | POA: Diagnosis not present

## 2019-06-26 DIAGNOSIS — K900 Celiac disease: Secondary | ICD-10-CM | POA: Diagnosis not present

## 2019-06-26 DIAGNOSIS — Z9221 Personal history of antineoplastic chemotherapy: Secondary | ICD-10-CM | POA: Diagnosis not present

## 2019-06-26 DIAGNOSIS — Z79899 Other long term (current) drug therapy: Secondary | ICD-10-CM | POA: Diagnosis not present

## 2019-06-26 DIAGNOSIS — E039 Hypothyroidism, unspecified: Secondary | ICD-10-CM | POA: Diagnosis not present

## 2019-06-26 DIAGNOSIS — I10 Essential (primary) hypertension: Secondary | ICD-10-CM | POA: Diagnosis not present

## 2019-06-26 DIAGNOSIS — R000 Tachycardia, unspecified: Secondary | ICD-10-CM | POA: Diagnosis not present

## 2019-06-26 NOTE — Progress Notes (Signed)
[place] Telephone:([phone]   [phone]) [phone]  OFFICE PROGRESS NOTE  [name], NP [place] [id number]  DIAGNOSIS: stage IV (T1a, N0, M1 a) non-small cell lung cancer, adenocarcinoma presented with multifocal disease in both lungs including 3 nodules in the left lung as well as 2 nodules in the right lung diagnosed in December 2019.  No actionable mutations KRAS G12C BCOR N1425S-subclonal RAD21 amplification  PDL 1 expression 1%.  PRIOR THERAPY:  1) Systemic chemotherapy with carboplatin for AUC of 5, Alimta 500 mg/M2 and Keytruda 200 mg IV every 3 weeks. First dose April 12, 2018. Status post 4 cycles. starting from cycle #3, Keytruda was dropped and patient received treatment with Carboplatin for an AUC of 5 and Alimta 500 mg/m2 IV every 3 weeks. 2) Systemic chemotherapy with Alimta 500 mg/m2 IV every 3 weeks. Status post 3 cycles of single agent Alimta. Most recent dose given on 08/23/2018.  The treatment is currently on hold secondary to renal insufficiency.  CURRENT THERAPY:  Observation.  INTERVAL HISTORY: [name] 64 y.o. female returns to the clinic today for follow-up visit.  The patient is feeling fine today with no concerning complaints except for intermittent nausea as well as tachycardia.  She is followed by Dr. Aundra Dubin for her cardiac issues and she is currently on metoprolol.  She denied having any current chest pain but has shortness of breath with exertion with no cough or hemoptysis.  She denied having any fever or chills.  She has no current nausea, vomiting, diarrhea or constipation.  She has no headache or visual changes.  She was followed by gastroenterology for acid reflux and she missed her appointment last year and trying to reschedule for evaluation of her dyspepsia and persistent nausea.  The patient had repeat CT scan of the chest performed recently and she is here for evaluation and discussion  of her risk her results.  MEDICAL HISTORY: Past Medical History:  Diagnosis Date  . Anemia    "long time ago" (12/06/2012)  . Anxiety   . Arthritis    "right knee real bad; in my hands bad" (12/06/2012)  . Asthma   . Celiac disease   . Colon polyps   . COPD (chronic obstructive pulmonary disease) ([place])   . Coronary artery disease    a. s/p Xience DES to [facility] 04/2008;  b. LHC 05/2008: Proximal RCA 25%, mid RCA stent patent.  c. Anormal nuc 2014 -> s/p LHC with severe mRCA stenosis s/p DES. d. Cath 04/2013 s/p DES to LAD.  e. LHC 08/2015 was stable.  . Depression    "years ago" (12/06/2012)  . Fibromyalgia   . Gallstones   . GERD (gastroesophageal reflux disease)   . H/O hiatal hernia   . Hepatitis    "not A, B, or C" (12/06/2012)  . Hyperlipidemia    intol to statins and other chol agents due to elevated LFTs  . Hypertension   . Hypothyroidism   . IBS (irritable bowel syndrome)   . Interstitial cystitis   . Lung cancer ([place]) dx'd 01/2018  . Lung nodules    Bilateral  . Migraines    "when I was younger" (12/06/2012)  . Pneumonia   . PONV (postoperative nausea and vomiting)    "Very bad"  . Premature atrial contractions   . PVC's (premature ventricular contractions)   . Sinus headache   . SVT (supraventricular tachycardia) ([place])  ALLERGIES:  is allergic to ciprofloxacin; levofloxacin; statins; tricor [fenofibrate]; compazine [prochlorperazine]; latex; codeine; and metoprolol.  MEDICATIONS:  Current Outpatient Medications  Medication Sig Dispense Refill  . albuterol (PROAIR HFA) 108 (90 Base) MCG/ACT inhaler Inhale 1 puff into the lungs every 6 (six) hours as needed (wheezing/shortness of breath.).     Marland Kitchen albuterol (PROVENTIL) (5 MG/ML) 0.5% nebulizer solution Take 2.5 mg by nebulization every 6 (six) hours as needed for wheezing.    Marland Kitchen amLODipine (NORVASC) 5 MG tablet Take 1.5 tablets (7.5 mg total) by mouth daily. 180 tablet 3  . aspirin 81 MG chewable tablet Chew 1 tablet  (81 mg total) by mouth daily.    . BELSOMRA 20 MG TABS Take 20 mg by mouth at bedtime as needed for sleep.    Marland Kitchen buPROPion (WELLBUTRIN SR) 150 MG 12 hr tablet Take 150 mg by mouth daily.     . DULoxetine (CYMBALTA) 60 MG capsule Take 120 mg by mouth daily.    . folic acid (FOLVITE) 1 MG tablet Take 1 tablet (1 mg total) by mouth daily. 30 tablet 4  . HYDROcodone-acetaminophen (NORCO) 7.5-325 MG per tablet Take 1 tablet by mouth every 6 (six) hours as needed for pain.    Marland Kitchen ipratropium-albuterol (DUONEB) 0.5-2.5 (3) MG/3ML SOLN Inhale 3 mLs into the lungs daily.    Marland Kitchen levothyroxine (SYNTHROID, LEVOTHROID) 25 MCG tablet Take 25 mcg by mouth daily before breakfast.    . lidocaine-prilocaine (EMLA) cream Apply 1 application topically as needed. 30 g 0  . loperamide (IMODIUM) 2 MG capsule Take 1 capsule (2 mg total) by mouth as needed for diarrhea or loose stools (Initiate if stool C diff -ve). 30 capsule 0  . LORazepam (ATIVAN) 2 MG tablet Take 2 mg by mouth every evening.     . metoprolol tartrate (LOPRESSOR) 25 MG tablet Take 0.5 tablets (12.5 mg total) by mouth 2 (two) times daily. 90 tablet 3  . montelukast (SINGULAIR) 10 MG tablet Take 10 mg by mouth at bedtime.     . nitroGLYCERIN (NITROSTAT) 0.4 MG SL tablet Place 1 tablet (0.4 mg total) under the tongue every 5 (five) minutes as needed for chest pain. 25 tablet 12  . omeprazole (PRILOSEC) 40 MG capsule You will be due for a yearly follow up visit in March 2021. 90 capsule 0  . ondansetron (ZOFRAN ODT) 4 MG disintegrating tablet Take 1 tablet (4 mg total) by mouth every 8 (eight) hours as needed for nausea or vomiting. 20 tablet 0  . ondansetron (ZOFRAN) 8 MG tablet Take 1 tablet (8 mg total) by mouth every 8 (eight) hours as needed for nausea or vomiting. 30 tablet 0  . SYMBICORT 160-4.5 MCG/ACT inhaler Take 2 puffs by mouth 2 (two) times daily.  3  . vitamin B-12 100 MCG tablet Take 1 tablet (100 mcg total) by mouth daily. 30 tablet 1  . Vitamin  D, Ergocalciferol, (DRISDOL) 1.25 MG (50000 UT) CAPS capsule      No current facility-administered medications for this visit.    SURGICAL HISTORY:  Past Surgical History:  Procedure Laterality Date  . CARDIAC CATHETERIZATION  04/2008; 05/2008  . CARDIAC CATHETERIZATION N/A 08/07/2015   Procedure: Left Heart Cath and Coronary Angiography;  Surgeon: Sherren Mocha, MD;  Location: [facility];  Service: Cardiovascular;  Laterality: N/A;  . CHOLECYSTECTOMY    . CORONARY ANGIOPLASTY WITH STENT PLACEMENT  04/2008 12/06/2012   "1 + 1" (12/06/2012)  . CORONARY STENT PLACEMENT  05/08/2013   DES TO LAD      DR MCALHANY  . IR IMAGING GUIDED PORT INSERTION  04/29/2018  . LEFT HEART CATHETERIZATION WITH CORONARY ANGIOGRAM N/A 12/06/2012   Procedure: LEFT HEART CATHETERIZATION WITH CORONARY ANGIOGRAM;  Surgeon: [name], MD;  Location: [facility] CATH LAB;  Service: Cardiovascular;  Laterality: N/A;  . LEFT HEART CATHETERIZATION WITH CORONARY ANGIOGRAM N/A 05/08/2013   Procedure: LEFT HEART CATHETERIZATION WITH CORONARY ANGIOGRAM;  Surgeon: [name], MD;  Location: [facility] CATH LAB;  Service: Cardiovascular;  Laterality: N/A;  . LEFT HEART CATHETERIZATION WITH CORONARY ANGIOGRAM N/A 01/04/2014   Procedure: LEFT HEART CATHETERIZATION WITH CORONARY ANGIOGRAM;  Surgeon: [name], MD;  Location: [facility] CATH LAB;  Service: Cardiovascular;  Laterality: N/A;  . PERCUTANEOUS CORONARY STENT INTERVENTION (PCI-S)  12/06/2012   Procedure: PERCUTANEOUS CORONARY STENT INTERVENTION (PCI-S);  Surgeon: [name], MD;  Location: [facility] CATH LAB;  Service: Cardiovascular;;  . PERCUTANEOUS CORONARY STENT INTERVENTION (PCI-S)  05/08/2013   Procedure: PERCUTANEOUS CORONARY STENT INTERVENTION (PCI-S);  Surgeon: [name], MD;  Location: [facility] CATH LAB;  Service: Cardiovascular;;  mid LAD   . TUBAL LIGATION    . VAGINAL HYSTERECTOMY    . VIDEO BRONCHOSCOPY WITH ENDOBRONCHIAL NAVIGATION  N/A 02/17/2018   Procedure: VIDEO BRONCHOSCOPY WITH ENDOBRONCHIAL NAVIGATION;  Surgeon: Melrose Nakayama, MD;  Location: [place];  Service: Thoracic;  Laterality: N/A;    REVIEW OF SYSTEMS:  A comprehensive review of systems was negative except for: Respiratory: positive for dyspnea on exertion Gastrointestinal: positive for dyspepsia and nausea   PHYSICAL EXAMINATION: General appearance: alert, cooperative, fatigued and no distress Head: Normocephalic, without obvious abnormality, atraumatic Neck: no adenopathy, no JVD, supple, symmetrical, trachea midline and thyroid not enlarged, symmetric, no tenderness/mass/nodules Lymph nodes: Cervical, supraclavicular, and axillary nodes normal. Resp: clear to auscultation bilaterally Back: symmetric, no curvature. ROM normal. No CVA tenderness. Cardio: regular rate and rhythm, S1, S2 normal, no murmur, click, rub or gallop GI: soft, non-tender; bowel sounds normal; no masses,  no organomegaly Extremities: extremities normal, atraumatic, no cyanosis or edema  ECOG PERFORMANCE STATUS: 1 - Symptomatic but completely ambulatory  Blood pressure (!) 142/96, pulse 88, temperature 99.1 F (37.3 C), temperature source Oral, resp. rate 19, height 5' 7"  (1.702 m), weight 171 lb 11.2 oz (77.9 kg), SpO2 100 %.  LABORATORY DATA: Lab Results  Component Value Date   WBC 8.0 06/23/2019   HGB 11.8 (L) 06/23/2019   HCT 35.6 (L) 06/23/2019   MCV 93.7 06/23/2019   PLT 198 06/23/2019      Chemistry      Component Value Date/Time   NA 144 06/23/2019 1107   NA 143 01/10/2018 1629   K 3.8 06/23/2019 1107   CL 110 06/23/2019 1107   CO2 22 06/23/2019 1107   BUN 18 06/23/2019 1107   BUN 14 01/10/2018 1629   CREATININE 1.30 (H) 06/23/2019 1107   CREATININE 0.68 08/05/2015 1456      Component Value Date/Time   CALCIUM 8.8 (L) 06/23/2019 1107   ALKPHOS 95 06/23/2019 1107   AST 11 (L) 06/23/2019 1107   ALT 11 06/23/2019 1107   BILITOT 0.4 06/23/2019 1107        RADIOGRAPHIC STUDIES: CT Chest Wo Contrast  Result Date: 06/24/2019 CLINICAL DATA:  Follow-up bilateral non-small cell lung carcinoma. Previous chemotherapy and immunotherapy. EXAM: CT CHEST WITHOUT CONTRAST TECHNIQUE: Multidetector CT imaging of the chest was performed following the standard protocol without IV contrast. COMPARISON:  02/10/2019 and 08/22/2018 FINDINGS: Cardiovascular: No acute findings. Aortic and coronary artery atherosclerosis incidentally noted. Mediastinum/Nodes: No masses or pathologically enlarged lymph nodes identified on this unenhanced exam. Lungs/Pleura: Mild centrilobular emphysema again noted. Bilateral sub-solid pulmonary nodules are again seen in both lungs, without significant change. Index nodule in the posterior right upper lobe measures 8 mm on image 49/5, without change. Index nodules in left lower lobe measure 11 mm on image 43/5 and 2.3 cm on image 58/5, also without change. No new or enlarging pulmonary nodules or masses identified. No evidence of pleural effusion. Upper Abdomen:  Unremarkable. Musculoskeletal:  No suspicious bone lesions. IMPRESSION: 1. No significant change in bilateral sub-solid pulmonary nodules. Suggest continued follow-up by chest CT in 6 months. 2. No evidence of lymphadenopathy or pleural effusion. Aortic Atherosclerosis (ICD10-I70.0) and Emphysema (ICD10-J43.9). Coronary artery atherosclerosis. Electronically Signed   By: Marlaine Hind M.D.   On: 06/24/2019 13:28    ASSESSMENT AND PLAN: This is a very pleasant 64 years old white female recently diagnosed with a stage IV non-small cell lung cancer, adenocarcinoma with no actionable mutations and PDL 1 expression of 1% diagnosed in December 2019 and presented with bilateral pulmonary nodules. The patient was initially started on treatment with carboplatin, Alimta and Keytruda status post 4 cycles but Keytruda was discontinued after cycle #2 secondary to significant skin rash. She was  treated with maintenance treatment with single agent Alimta for 3 cycles but this is currently on hold since June 2020 secondary to worsening renal insufficiency. The patient is currently on observation and she is feeling fine today with no concerning complaints. She had repeat CT scan of the chest performed recently.  I personally and independently reviewed the scans and discussed the results with the patient today. Her scan showed no concerning findings for disease progression. I recommended for her to continue on observation with repeat CT scan of the chest in 4 months. For the tachycardia she was advised to continue with her current medication and to consult with her cardiologist. The patient was advised to call immediately if she has any concerning symptoms in the interval. The patient voices understanding of current disease status and treatment options and is in agreement with the current care plan. All questions were answered. The patient knows to call the clinic with any problems, questions or concerns. We can certainly see the patient much sooner if necessary.   Disclaimer: This note was dictated with voice recognition software. Similar sounding words can inadvertently be transcribed and may not be corrected upon review.

## 2019-06-28 ENCOUNTER — Telehealth: Payer: Self-pay | Admitting: Internal Medicine

## 2019-06-28 NOTE — Telephone Encounter (Signed)
Scheduled per los. Called and left msg. Mailed printout  °

## 2019-07-03 ENCOUNTER — Encounter: Payer: Self-pay | Admitting: Internal Medicine

## 2019-07-17 ENCOUNTER — Telehealth: Payer: Self-pay | Admitting: Internal Medicine

## 2019-07-17 NOTE — Telephone Encounter (Signed)
Patient called to schedule maintenanceport flush appointment, per patient's request appointment has been added in June.

## 2019-08-28 ENCOUNTER — Telehealth: Payer: Self-pay | Admitting: Medical Oncology

## 2019-08-28 ENCOUNTER — Other Ambulatory Visit: Payer: Self-pay

## 2019-08-28 ENCOUNTER — Inpatient Hospital Stay: Payer: Managed Care, Other (non HMO) | Attending: Internal Medicine

## 2019-08-28 DIAGNOSIS — C3492 Malignant neoplasm of unspecified part of left bronchus or lung: Secondary | ICD-10-CM | POA: Insufficient documentation

## 2019-08-28 DIAGNOSIS — Z95828 Presence of other vascular implants and grafts: Secondary | ICD-10-CM

## 2019-08-28 DIAGNOSIS — Z452 Encounter for adjustment and management of vascular access device: Secondary | ICD-10-CM | POA: Insufficient documentation

## 2019-08-28 MED ORDER — HEPARIN SOD (PORK) LOCK FLUSH 100 UNIT/ML IV SOLN
500.0000 [IU] | Freq: Once | INTRAVENOUS | Status: AC
  Administered 2019-08-28: 500 [IU] via INTRAVENOUS
  Filled 2019-08-28: qty 5

## 2019-08-28 MED ORDER — SODIUM CHLORIDE 0.9% FLUSH
10.0000 mL | INTRAVENOUS | Status: DC | PRN
  Administered 2019-08-28: 10 mL via INTRAVENOUS
  Filled 2019-08-28: qty 10

## 2019-08-28 NOTE — Telephone Encounter (Signed)
Weak , fell in the kitchen yesterday. "my kidneys hurt". Denies UTI symptoms.  Asking to be seen and have labs checked when she comes in for port flush today.   I told her to contact PCP.

## 2019-10-11 ENCOUNTER — Emergency Department (HOSPITAL_COMMUNITY): Payer: Managed Care, Other (non HMO)

## 2019-10-11 ENCOUNTER — Emergency Department (HOSPITAL_COMMUNITY)
Admission: EM | Admit: 2019-10-11 | Discharge: 2019-10-12 | Disposition: A | Discharge status: Home / Self Care | Payer: Managed Care, Other (non HMO) | Attending: Emergency Medicine | Admitting: Emergency Medicine

## 2019-10-11 ENCOUNTER — Telehealth: Payer: Self-pay | Admitting: Medical Oncology

## 2019-10-11 ENCOUNTER — Encounter (HOSPITAL_COMMUNITY): Payer: Self-pay | Admitting: *Deleted

## 2019-10-11 DIAGNOSIS — Z87891 Personal history of nicotine dependence: Secondary | ICD-10-CM | POA: Diagnosis not present

## 2019-10-11 DIAGNOSIS — Y929 Unspecified place or not applicable: Secondary | ICD-10-CM | POA: Insufficient documentation

## 2019-10-11 DIAGNOSIS — Z85118 Personal history of other malignant neoplasm of bronchus and lung: Secondary | ICD-10-CM | POA: Diagnosis not present

## 2019-10-11 DIAGNOSIS — R29810 Facial weakness: Secondary | ICD-10-CM | POA: Insufficient documentation

## 2019-10-11 DIAGNOSIS — R519 Headache, unspecified: Secondary | ICD-10-CM | POA: Insufficient documentation

## 2019-10-11 DIAGNOSIS — Y999 Unspecified external cause status: Secondary | ICD-10-CM | POA: Diagnosis not present

## 2019-10-11 DIAGNOSIS — Y939 Activity, unspecified: Secondary | ICD-10-CM | POA: Insufficient documentation

## 2019-10-11 DIAGNOSIS — G8929 Other chronic pain: Secondary | ICD-10-CM | POA: Diagnosis not present

## 2019-10-11 DIAGNOSIS — Z7982 Long term (current) use of aspirin: Secondary | ICD-10-CM | POA: Diagnosis not present

## 2019-10-11 DIAGNOSIS — R202 Paresthesia of skin: Secondary | ICD-10-CM | POA: Diagnosis not present

## 2019-10-11 DIAGNOSIS — I2510 Atherosclerotic heart disease of native coronary artery without angina pectoris: Secondary | ICD-10-CM | POA: Diagnosis not present

## 2019-10-11 DIAGNOSIS — W19XXXA Unspecified fall, initial encounter: Secondary | ICD-10-CM | POA: Insufficient documentation

## 2019-10-11 DIAGNOSIS — Z951 Presence of aortocoronary bypass graft: Secondary | ICD-10-CM | POA: Diagnosis not present

## 2019-10-11 DIAGNOSIS — M549 Dorsalgia, unspecified: Secondary | ICD-10-CM | POA: Insufficient documentation

## 2019-10-11 DIAGNOSIS — Z79899 Other long term (current) drug therapy: Secondary | ICD-10-CM | POA: Insufficient documentation

## 2019-10-11 DIAGNOSIS — J449 Chronic obstructive pulmonary disease, unspecified: Secondary | ICD-10-CM | POA: Insufficient documentation

## 2019-10-11 DIAGNOSIS — E039 Hypothyroidism, unspecified: Secondary | ICD-10-CM | POA: Diagnosis not present

## 2019-10-11 DIAGNOSIS — R2689 Other abnormalities of gait and mobility: Secondary | ICD-10-CM | POA: Diagnosis not present

## 2019-10-11 DIAGNOSIS — J45909 Unspecified asthma, uncomplicated: Secondary | ICD-10-CM | POA: Insufficient documentation

## 2019-10-11 DIAGNOSIS — Z91040 Latex allergy status: Secondary | ICD-10-CM | POA: Diagnosis not present

## 2019-10-11 DIAGNOSIS — R531 Weakness: Secondary | ICD-10-CM | POA: Insufficient documentation

## 2019-10-11 LAB — URINALYSIS, ROUTINE W REFLEX MICROSCOPIC
Bilirubin Urine: NEGATIVE
Glucose, UA: NEGATIVE mg/dL
Ketones, ur: NEGATIVE mg/dL
Leukocytes,Ua: NEGATIVE
Nitrite: NEGATIVE
Protein, ur: 100 mg/dL — AB
Specific Gravity, Urine: 1.027 (ref 1.005–1.030)
pH: 5 (ref 5.0–8.0)

## 2019-10-11 LAB — BASIC METABOLIC PANEL
Anion gap: 9 (ref 5–15)
BUN: 25 mg/dL — ABNORMAL HIGH (ref 8–23)
CO2: 21 mmol/L — ABNORMAL LOW (ref 22–32)
Calcium: 9.2 mg/dL (ref 8.9–10.3)
Chloride: 110 mmol/L (ref 98–111)
Creatinine, Ser: 1.18 mg/dL — ABNORMAL HIGH (ref 0.44–1.00)
GFR calc Af Amer: 56 mL/min — ABNORMAL LOW (ref >60)
GFR calc non Af Amer: 49 mL/min — ABNORMAL LOW (ref >60)
Glucose, Bld: 131 mg/dL — ABNORMAL HIGH (ref 70–99)
Potassium: 3.7 mmol/L (ref 3.5–5.1)
Sodium: 140 mmol/L (ref 135–145)

## 2019-10-11 LAB — CBC
HCT: 39.7 % (ref 36.0–46.0)
Hemoglobin: 12.3 g/dL (ref 12.0–15.0)
MCH: 29.6 pg (ref 26.0–34.0)
MCHC: 31 g/dL (ref 30.0–36.0)
MCV: 95.4 fL (ref 80.0–100.0)
Platelets: 249 10*3/uL (ref 150–400)
RBC: 4.16 MIL/uL (ref 3.87–5.11)
RDW: 14.1 % (ref 11.5–15.5)
WBC: 9.4 10*3/uL (ref 4.0–10.5)
nRBC: 0 % (ref 0.0–0.2)

## 2019-10-11 NOTE — Telephone Encounter (Signed)
She will need to go to the emergency department for evaluation and to rule out brain metastasis or stroke.  Thank you.

## 2019-10-11 NOTE — ED Triage Notes (Signed)
To ED for eval of falls and 'doing strange things'. States for past 2 wks she's fallen multiple times. No head injury. States she got out of her car the other day and forgot to put it into park. Alert and oriented. Speech clear. Denies HA. Pt states she has stage 4 lung cancer. Unable to take chemo due to kidney function.

## 2019-10-11 NOTE — ED Provider Notes (Addendum)
Patient placed in Quick Look pathway, seen and evaluated   Chief Complaint: Unbalanced gait, forgetfulness, blurry vision x 2 weeks  HPI:   Patient sent by Cancer physician, stage IV lung cancer.  Reports unbalanced gait, forgetfulness, blurry vision x2 weeks.  No blood thinners.  No history of strokes.  Has had multiple falls over the last 2 weeks. No head injury or LOC. No headache.  ROS: Forgetfulness, unbalanced gait   Physical Exam:   Gen: No distress  Neuro: Awake and Alert  Skin: Warm    Focused Exam: Speech clear, moving all 4 extremities without difficulty.  No facial droop. A&0 x 4   Initiation of care has begun. The patient has been counseled on the process, plan, and necessity for staying for the completion/evaluation, and the remainder of the medical screening examination  Reviewed oncology physician Dr. Unk Lightning note, sent here to the ED to rule out brain metastasis or stroke.  Labs, CT head ordered      Lyndel Safe 10/11/19 1523    Maudie Flakes, MD 10/11/19 1525

## 2019-10-11 NOTE — Telephone Encounter (Signed)
Pt contacted and she will call her husband to take her to [facility] ED now.

## 2019-10-11 NOTE — Telephone Encounter (Signed)
Cognitive issues, stumbling- For the past 2 weeks she has been stumbling-she had   5 falls - in tub and she went to sit on couch and ended up on floor. She reports  a "weird headache on the top of her head".  For example,  She got " scrambled up on how to use the cell phone and one day I he stopped my car at mailbox to get mail , got out and when I  got back in something did not look right and the car was in neutral not park".

## 2019-10-12 ENCOUNTER — Emergency Department (HOSPITAL_COMMUNITY): Payer: Managed Care, Other (non HMO)

## 2019-10-12 DIAGNOSIS — R519 Headache, unspecified: Secondary | ICD-10-CM | POA: Diagnosis not present

## 2019-10-12 MED ORDER — LORAZEPAM 2 MG/ML IJ SOLN
1.0000 mg | Freq: Once | INTRAMUSCULAR | Status: AC
  Administered 2019-10-12: 1 mg via INTRAVENOUS
  Filled 2019-10-12: qty 1

## 2019-10-12 NOTE — ED Provider Notes (Signed)
[name] Provider Note   CSN: [id number] Arrival date & time: 10/11/19  1426     History Chief Complaint  Patient presents with  . Superior is a 64 y.o. female with past medical history significant for stage 4 lung cancer. Over the last 2-3 weeks has had multiple falls. Feels like she is off balance. No recent falls, or LOC. States she is forgetting simple tasks like she previous did like forgetting to put the car in park and put the car in neutral. Feels like if she leans over she falls and is unable to catch her self. Feels like her gait is off.  States she will occasionally get a frontal sinus headache which is chronic in nature.  No sudden onset thunderclap headache.  No vision changes, neck pain, neck stiffness, unilateral weakness, paresthesias, facial droop.  No chest pain, shortness of breath abdominal pain, diarrhea, dysuria.  No prior history of strokes.  Denies additional aggravating or relieving factors. Has chronic low back pain not new in nature. No bowel or bladder incontinence saddle paresthesias.  No preceding symptoms prior to falls. NO palpitations, CP, SOB, Weakness or paresthesias  History obtained from patient and past medical records. No interpretor was used.  Sent by Dr. Earlie Server with oncology for MRI to rule out brain mets vs CVA  HPI     Past Medical History:  Diagnosis Date  . Anemia    "long time ago" (12/06/2012)  . Anxiety   . Arthritis    "right knee real bad; in my hands bad" (12/06/2012)  . Asthma   . Celiac disease   . Colon polyps   . COPD (chronic obstructive pulmonary disease) ([place])   . Coronary artery disease    a. s/p Xience DES to [facility] 04/2008;  b. LHC 05/2008: Proximal RCA 25%, mid RCA stent patent.  c. Anormal nuc 2014 -> s/p LHC with severe mRCA stenosis s/p DES. d. Cath 04/2013 s/p DES to LAD.  e. LHC 08/2015 was stable.  . Depression    "years ago" (12/06/2012)  . Fibromyalgia   .  Gallstones   . GERD (gastroesophageal reflux disease)   . H/O hiatal hernia   . Hepatitis    "not A, B, or C" (12/06/2012)  . Hyperlipidemia    intol to statins and other chol agents due to elevated LFTs  . Hypertension   . Hypothyroidism   . IBS (irritable bowel syndrome)   . Interstitial cystitis   . Lung cancer ([place]) dx'd 01/2018  . Lung nodules    Bilateral  . Migraines    "when I was younger" (12/06/2012)  . Pneumonia   . PONV (postoperative nausea and vomiting)    "Very bad"  . Premature atrial contractions   . PVC's (premature ventricular contractions)   . Sinus headache   . SVT (supraventricular tachycardia) [facility])     Patient Active Problem List   Diagnosis Date Noted  . Anemia associated with chemotherapy 10/07/2018  . Thrombocytopenia ([place]) 10/07/2018  . Acute pyelonephritis 10/06/2018  . Lung cancer ([place])   . Acute lower UTI   . Diarrhea 06/20/2018  . Tardive dyskinesia 06/20/2018  . Medication side effect 06/20/2018  . Nausea and vomiting 05/11/2018  . AKI (acute kidney injury) ([place]) 05/11/2018  . Port-A-Cath in place 05/02/2018  . Adenocarcinoma of left lung, stage 4 ([place]) 04/05/2018  . Encounter for antineoplastic chemotherapy 04/05/2018  . Encounter for antineoplastic immunotherapy 04/05/2018  .  Goals of care, counseling/discussion 04/05/2018  . Tobacco abuse counseling 03/05/2018  . PVC's (premature ventricular contractions)   . SVT (supraventricular tachycardia) ([place])   . Premature atrial contractions   . Unstable angina ([place]) 12/07/2012  . Tachycardia 02/19/2009  . CAD, NATIVE VESSEL 06/04/2008  . Hyperlipemia 05/29/2008  . DEPRESSION 05/29/2008  . Asthma 05/29/2008  . GERD 05/29/2008  . Irritable bowel syndrome 05/29/2008  . DEGENERATIVE JOINT DISEASE, LUMBAR SPINE 05/29/2008  . FIBROMYALGIA 05/29/2008  . CHEST PAIN-PRECORDIAL 05/07/2008    Past Surgical History:  Procedure Laterality Date  . CARDIAC CATHETERIZATION  04/2008; 05/2008  .  CARDIAC CATHETERIZATION N/A 08/07/2015   Procedure: Left Heart Cath and Coronary Angiography;  Surgeon: Sherren Mocha, MD;  Location: [facility];  Service: Cardiovascular;  Laterality: N/A;  . CHOLECYSTECTOMY    . CORONARY ANGIOPLASTY WITH STENT PLACEMENT  04/2008 12/06/2012   "1 + 1" (12/06/2012)  . CORONARY STENT PLACEMENT  05/08/2013   DES TO LAD      DR MCALHANY  . IR IMAGING GUIDED PORT INSERTION  04/29/2018  . LEFT HEART CATHETERIZATION WITH CORONARY ANGIOGRAM N/A 12/06/2012   Procedure: LEFT HEART CATHETERIZATION WITH CORONARY ANGIOGRAM;  Surgeon: [name], MD;  Location: [facility] CATH LAB;  Service: Cardiovascular;  Laterality: N/A;  . LEFT HEART CATHETERIZATION WITH CORONARY ANGIOGRAM N/A 05/08/2013   Procedure: LEFT HEART CATHETERIZATION WITH CORONARY ANGIOGRAM;  Surgeon: [name], MD;  Location: [facility] CATH LAB;  Service: Cardiovascular;  Laterality: N/A;  . LEFT HEART CATHETERIZATION WITH CORONARY ANGIOGRAM N/A 01/04/2014   Procedure: LEFT HEART CATHETERIZATION WITH CORONARY ANGIOGRAM;  Surgeon: [name], MD;  Location: [facility] CATH LAB;  Service: Cardiovascular;  Laterality: N/A;  . PERCUTANEOUS CORONARY STENT INTERVENTION (PCI-S)  12/06/2012   Procedure: PERCUTANEOUS CORONARY STENT INTERVENTION (PCI-S);  Surgeon: [name], MD;  Location: [facility] CATH LAB;  Service: Cardiovascular;;  . PERCUTANEOUS CORONARY STENT INTERVENTION (PCI-S)  05/08/2013   Procedure: PERCUTANEOUS CORONARY STENT INTERVENTION (PCI-S);  Surgeon: [name], MD;  Location: [facility] CATH LAB;  Service: Cardiovascular;;  mid LAD   . TUBAL LIGATION    . VAGINAL HYSTERECTOMY    . VIDEO BRONCHOSCOPY WITH ENDOBRONCHIAL NAVIGATION N/A 02/17/2018   Procedure: VIDEO BRONCHOSCOPY WITH ENDOBRONCHIAL NAVIGATION;  Surgeon: Melrose Nakayama, MD;  Location: [facility];  Service: Thoracic;  Laterality: N/A;     OB History   No obstetric history on file.     Family History  Problem  Relation Age of Onset  . Colon cancer Father 25  . Lung disease Father   . Emphysema Mother   . Drug abuse Brother   . Hepatitis C Brother   . Drug abuse Brother   . Celiac disease Son   . Celiac disease Grandson   . Down syndrome Grandson   . Diabetes Granddaughter   . CAD Neg Hx     Social History   Tobacco Use  . Smoking status: Former Smoker    Packs/day: 0.50    Years: 30.00    Pack years: 15.00    Types: Cigarettes    Quit date: 2009    Years since quitting: 12.6  . Smokeless tobacco: Never Used  . Tobacco comment: 12/06/2012 "quit smoking cigarettes ~ 8 yr ago"  Vaping Use  . Vaping Use: Never used  Substance Use Topics  . Alcohol use: No  . Drug use: No    Home Medications Prior to Admission medications   Medication Sig Start Date End  Date Taking? Authorizing Provider  albuterol (PROAIR HFA) 108 (90 Base) MCG/ACT inhaler Inhale 1 puff into the lungs every 6 (six) hours as needed (wheezing/shortness of breath.).    Yes [provider]  albuterol (PROVENTIL) (5 MG/ML) 0.5% nebulizer solution Take 2.5 mg by nebulization every 6 (six) hours as needed for wheezing.   Yes [provider]  aspirin 81 MG chewable tablet Chew 1 tablet (81 mg total) by mouth daily. 12/07/12  Yes Barrett, Rhonda G, PA-C  BELSOMRA 20 MG TABS Take 20 mg by mouth at bedtime as needed for sleep. 04/29/18  Yes [provider]  buPROPion (WELLBUTRIN SR) 150 MG 12 hr tablet Take 150 mg by mouth daily.    Yes [provider]  DULoxetine (CYMBALTA) 60 MG capsule Take 120 mg by mouth daily. 04/28/18  Yes [provider]  HYDROcodone-acetaminophen (NORCO) 7.5-325 MG per tablet Take 1 tablet by mouth every 6 (six) hours as needed for pain.   Yes [provider]  ipratropium-albuterol (DUONEB) 0.5-2.5 (3) MG/3ML SOLN Inhale 3 mLs into the lungs every 6 (six) hours as needed (Asthma).  10/02/18  Yes [provider]  lidocaine-prilocaine (EMLA) cream  Apply 1 application topically as needed. 04/19/18  Yes Heilingoetter, Cassandra L, PA-C  loperamide (IMODIUM) 2 MG capsule Take 1 capsule (2 mg total) by mouth as needed for diarrhea or loose stools (Initiate if stool C diff -ve). 05/14/18  Yes Hosie Poisson, MD  LORazepam (ATIVAN) 2 MG tablet Take 2 mg by mouth every evening.    Yes [provider]  metoprolol tartrate (LOPRESSOR) 25 MG tablet Take 0.5 tablets (12.5 mg total) by mouth 2 (two) times daily. 03/15/19  Yes [name], MD  montelukast (SINGULAIR) 10 MG tablet Take 10 mg by mouth at bedtime.    Yes [provider]  nitroGLYCERIN (NITROSTAT) 0.4 MG SL tablet Place 1 tablet (0.4 mg total) under the tongue every 5 (five) minutes as needed for chest pain. 12/07/12  Yes Barrett, Evelene Croon, PA-C  omeprazole (PRILOSEC) 40 MG capsule You will be due for a yearly follow up visit in March 2021. Patient taking differently: Take 40 mg by mouth daily. You will be due for a yearly follow up visit in March 2021. 02/26/19  Yes Esterwood, Amy S, PA-C  ondansetron (ZOFRAN) 8 MG tablet Take 1 tablet (8 mg total) by mouth every 8 (eight) hours as needed for nausea or vomiting. 11/01/18  Yes Curt Bears, MD  SYMBICORT 160-4.5 MCG/ACT inhaler Take 2 puffs by mouth 2 (two) times daily. 12/08/17  Yes [provider]  Vitamin D, Ergocalciferol, (DRISDOL) 1.25 MG (50000 UT) CAPS capsule Take 50,000 Units by mouth every 7 (seven) days. Wednesdays 02/12/19  Yes [provider]    Allergies    Ciprofloxacin, Levofloxacin, Statins, Tricor [fenofibrate], Compazine [prochlorperazine], Latex, Codeine, and Metoprolol  Review of Systems   Review of Systems  Constitutional: Negative.   HENT: Negative.   Respiratory: Negative.   Cardiovascular: Negative.   Gastrointestinal: Negative.   Genitourinary: Negative.   Musculoskeletal: Positive for back pain (Chronic) and gait problem.  Skin: Negative.   Neurological: Positive  for weakness (Generalized). Negative for dizziness, tremors, seizures, syncope, facial asymmetry, speech difficulty, light-headedness, numbness and headaches.  All other systems reviewed and are negative.  Physical Exam Updated Vital Signs BP 121/78 (BP Location: Right Arm)   Pulse 90   Temp 98 F (36.7 C) (Oral)   Resp 16   Ht 5' 7"  (  1.702 m)   Wt 74.8 kg   SpO2 98%   BMI 25.84 kg/m   Physical Exam Physical Exam  Constitutional: Pt appears well-developed and well-nourished. No distress.  HENT:  Head: Normocephalic and atraumatic.  Mouth/Throat: Oropharynx is clear and moist. No oropharyngeal exudate.  Eyes: Conjunctivae are normal. No nystagmus Neck: Normal range of motion. Neck supple.  Full ROM without pain  Cardiovascular: Normal rate, regular rhythm and intact distal pulses.   Pulmonary/Chest: Effort normal and breath sounds normal. No respiratory distress. Pt has no wheezes.  Abdominal: Soft. Pt exhibits no distension. There is no tenderness, rebound or guarding. No abd bruit or pulsatile mass Musculoskeletal:  Full range of motion of the T-spine and L-spine with flexion, hyperextension, and lateral flexion. No midline tenderness or stepoffs. No tenderness to palpation of the spinous processes of the T-spine or L-spine. No tenderness to palpation of the paraspinous muscles of the L-spine. Negative straight leg raise. Lymphadenopathy:    Pt has no cervical adenopathy.  Neurological: Pt is alert. Pt has normal reflexes.  Reflex Scores:      Bicep reflexes are 2+ on the right side and 2+ on the left side.      Brachioradialis reflexes are 2+ on the right side and 2+ on the left side.      Patellar reflexes are 2+ on the right side and 2+ on the left side.      Achilles reflexes are 2+ on the right side and 2+ on the left side. Mental Status:  Alert, oriented, thought content appropriate. Speech fluent without evidence of aphasia. Able to follow 2 step commands without  difficulty.  Cranial Nerves:  II:  Peripheral visual fields grossly normal, pupils equal, round, reactive to light III,IV, VI: ptosis not present, extra-ocular motions intact bilaterally  V,VII: smile symmetric, facial light touch sensation equal VIII: hearing grossly normal bilaterally  IX,X: midline uvula rise  XI: bilateral shoulder shrug equal and strong XII: midline tongue extension  Motor:  5/5 in upper and lower extremities bilaterally including strong and equal grip strength and dorsiflexion/plantar flexion Sensory: Pinprick and light touch normal in all extremities.  Deep Tendon Reflexes: 2+ and symmetric  Cerebellar: normal finger-to-nose with bilateral upper extremities Gait: normal gait and balance CV: distal pulses palpable throughout   Skin: Skin is warm and dry. No rash noted or lesions noted. Pt is not diaphoretic. No erythema, ecchymosis,edema or warmth.  Psychiatric: Pt has a normal mood and affect. Behavior is normal.  Nursing note and vitals reviewed.   ED Results / Procedures / Treatments   Labs (all labs ordered are listed, but only abnormal results are displayed) Labs Reviewed  BASIC METABOLIC PANEL - Abnormal; Notable for the following components:      Result Value   CO2 21 (*)    Glucose, Bld 131 (*)    BUN 25 (*)    Creatinine, Ser 1.18 (*)    GFR calc non Af Amer 49 (*)    GFR calc Af Amer 56 (*)    All other components within normal limits  URINALYSIS, ROUTINE W REFLEX MICROSCOPIC - Abnormal; Notable for the following components:   Color, Urine AMBER (*)    APPearance HAZY (*)    Hgb urine dipstick SMALL (*)    Protein, ur 100 (*)    Bacteria, UA MANY (*)    All other components within normal limits  CBC    EKG EKG Interpretation  Date/Time:  Wednesday October 11 2019  15:13:07 EDT Ventricular Rate:  97 PR Interval:  142 QRS Duration: 68 QT Interval:  346 QTC Calculation: 439 R Axis:   63 Text Interpretation: Normal sinus rhythm Normal  ECG Confirmed by Quintella Reichert [phone]) on 10/11/2019 8:38:26 PM   Radiology CT Head Wo Contrast  Result Date: 10/11/2019 CLINICAL DATA:  Mental status change of unknown cause. All behavior. On bone skate. History of lung cancer. EXAM: CT HEAD WITHOUT CONTRAST TECHNIQUE: Contiguous axial images were obtained from the base of the skull through the vertex without intravenous contrast. COMPARISON:  May 13, 2018 FINDINGS: Brain: No hemorrhage. No extraaxial collection.No midline shift. There is atrophy.The basal cisterns are unremarkable. Patchy and confluent areas of decreased attenuation are noted throughout the deep and periventricular white matter of the cerebral hemispheres bilaterally, compatible with chronic microvascular ischemic disease. The brainstem is unremarkable. The cerebellum is unremarkable. The sella is unremarkable. Vascular: No hyperdense vessel or unexpected calcification. Skull: The calvarium is unremarkable. The skull base is unremarkable. The visualized upper cervical spine is unremarkable. Sinuses/Orbits: The visualized orbits are unremarkable. There is a mucosal retention cyst within the left maxillary sinus. The remaining paranasal sinuses and mastoid air cells are essentially clear. Other: The visualized parotid gland is unremarkable. There is no scalp soft tissue swelling. IMPRESSION: 1. No acute intracranial abnormality. 2. Atrophy and chronic microvascular ischemic disease. Electronically Signed   By: Constance Holster M.D.   On: 10/11/2019 17:34   MR BRAIN WO CONTRAST  Result Date: 10/12/2019 CLINICAL DATA:  TIA. Abnormal gait. Blurred vision for 2 weeks. Stage IV lung cancer. EXAM: MRI HEAD WITHOUT CONTRAST TECHNIQUE: Multiplanar, multiecho pulse sequences of the brain and surrounding structures were obtained without intravenous contrast. COMPARISON:  CT head without contrast 10/11/2019 FINDINGS: Brain: Mild atrophy and white matter changes are noted. No acute infarct,  hemorrhage, or mass lesion is present. The ventricles are of normal size. No significant extraaxial fluid collection is present. The internal auditory canals are within normal limits. The brainstem and cerebellum are within normal limits. Vascular: Flow is present in the major intracranial arteries. Skull and upper cervical spine: The craniocervical junction is normal. Upper cervical spine is within normal limits. Marrow signal is unremarkable. Sinuses/Orbits: A polyp or mucous retention cyst is present anteriorly in the left maxillary sinus. The paranasal sinuses and mastoid air cells are otherwise clear. The globes and orbits are within normal limits. IMPRESSION: 1. No acute intracranial abnormality or evidence for metastatic disease to the brain. 2. Mild atrophy and white matter disease likely reflects the sequela of chronic microvascular ischemia. Electronically Signed   By: San Morelle M.D.   On: 10/12/2019 06:18   Procedures Procedures (including critical care time)  Medications Ordered in ED Medications  LORazepam (ATIVAN) injection 1 mg (1 mg Intravenous Given 10/12/19 0514)   ED Course  I have reviewed the triage vital signs and the nursing notes.  Pertinent labs & imaging results that were available during my care of the patient were reviewed by me and considered in my medical decision making (see chart for details).  64 year old female stage IV lung cancer presents for evaluation of recurrent falls over the last 2 to 3 weeks.  Denies hitting head, LOC.  Feels like her gait is off and she is more forgetful. Patient with nonfocal neuro exam without deficits.  Her heart and lungs are clear.  Abdomen is soft, nontender. No midline spine tenderness crepitus.  Work-up started in triage. Ambulatory in ED without difficulty  Labs  and imaging personally reviewed and interpreted:  CBC without leukocytosis Metabolic panel with hyperglycemia to 133, BUN 25, creatinine 1.18 UA many bacteria  however nitrite negative, leukocyte negative, dirty catch, squames 11-20.  Patient denies any urinary complaints.   CT head without acute changes EKG without STEMI  Plan on MR brain to r/o CA mets vs CVA  Patient reassessed. No complaints. Pending MR brain  Patient reassessed.  She is ambulatory without difficulty.  She has no complaints.  MRI brain without acute CVA, cancer metastases.  Discussed findings with patient.  We will have her follow-up with neurology given her memory issues as well as her frequent falls.  The patient has been appropriately medically screened and/or stabilized in the ED. I have low suspicion for any other emergent medical condition which would require further screening, evaluation or treatment in the ED or require inpatient management.  Patient is hemodynamically stable and in no acute distress.  Patient able to ambulate in department prior to ED.  Evaluation does not show acute pathology that would require ongoing or additional emergent interventions while in the emergency department or further inpatient treatment.  I have discussed the diagnosis with the patient and answered all questions.  Pain is been managed while in the emergency department and patient has no further complaints prior to discharge.  Patient is comfortable with plan discussed in room and is stable for discharge at this time.  I have discussed strict return precautions for returning to the emergency department.  Patient was encouraged to follow-up with PCP/specialist refer to at discharge.  Patient discussed with attending, Dr. Betsey Holiday who agrees with treatment, plan and disposition .     MDM Rules/Calculators/A&P                           Final Clinical Impression(s) / ED Diagnoses Final diagnoses:  Fall, initial encounter  History of lung cancer    Rx / DC Orders ED Discharge Orders         Ordered    Ambulatory referral to Neurology     Discontinue  Reprint    Comments: An appointment  is requested in approximately: 2 weeks   10/12/19 0624           Abe Schools A, PA-C 10/12/19 [id number]    Orpah Greek, MD 10/12/19 [phone]

## 2019-10-12 NOTE — Discharge Instructions (Signed)
Follow up with Neurology  Return for new or worsening symptoms  MR brain and CT was without cancer for Stroke  Labs were without abnormality

## 2019-10-12 NOTE — ED Notes (Signed)
Patient transported to MRI 

## 2019-10-27 ENCOUNTER — Inpatient Hospital Stay: Payer: Managed Care, Other (non HMO) | Attending: Internal Medicine

## 2019-10-27 ENCOUNTER — Inpatient Hospital Stay: Payer: Managed Care, Other (non HMO)

## 2019-10-27 ENCOUNTER — Ambulatory Visit (HOSPITAL_COMMUNITY)
Admission: RE | Admit: 2019-10-27 | Discharge: 2019-10-27 | Disposition: A | Discharge status: Home / Self Care | Payer: Managed Care, Other (non HMO) | Source: Ambulatory Visit | Attending: Internal Medicine | Admitting: Internal Medicine

## 2019-10-27 ENCOUNTER — Other Ambulatory Visit: Payer: Self-pay

## 2019-10-27 DIAGNOSIS — C3492 Malignant neoplasm of unspecified part of left bronchus or lung: Secondary | ICD-10-CM | POA: Diagnosis not present

## 2019-10-27 DIAGNOSIS — J449 Chronic obstructive pulmonary disease, unspecified: Secondary | ICD-10-CM | POA: Insufficient documentation

## 2019-10-27 DIAGNOSIS — Z79899 Other long term (current) drug therapy: Secondary | ICD-10-CM | POA: Insufficient documentation

## 2019-10-27 DIAGNOSIS — Z9225 Personal history of immunosupression therapy: Secondary | ICD-10-CM | POA: Diagnosis not present

## 2019-10-27 DIAGNOSIS — E039 Hypothyroidism, unspecified: Secondary | ICD-10-CM | POA: Insufficient documentation

## 2019-10-27 DIAGNOSIS — Z9221 Personal history of antineoplastic chemotherapy: Secondary | ICD-10-CM | POA: Diagnosis not present

## 2019-10-27 DIAGNOSIS — E785 Hyperlipidemia, unspecified: Secondary | ICD-10-CM | POA: Insufficient documentation

## 2019-10-27 DIAGNOSIS — F418 Other specified anxiety disorders: Secondary | ICD-10-CM | POA: Insufficient documentation

## 2019-10-27 DIAGNOSIS — Z7982 Long term (current) use of aspirin: Secondary | ICD-10-CM | POA: Diagnosis not present

## 2019-10-27 DIAGNOSIS — K219 Gastro-esophageal reflux disease without esophagitis: Secondary | ICD-10-CM | POA: Diagnosis not present

## 2019-10-27 DIAGNOSIS — Z95828 Presence of other vascular implants and grafts: Secondary | ICD-10-CM

## 2019-10-27 DIAGNOSIS — I2510 Atherosclerotic heart disease of native coronary artery without angina pectoris: Secondary | ICD-10-CM | POA: Insufficient documentation

## 2019-10-27 DIAGNOSIS — C3490 Malignant neoplasm of unspecified part of unspecified bronchus or lung: Secondary | ICD-10-CM | POA: Diagnosis not present

## 2019-10-27 DIAGNOSIS — C3491 Malignant neoplasm of unspecified part of right bronchus or lung: Secondary | ICD-10-CM | POA: Diagnosis present

## 2019-10-27 DIAGNOSIS — N289 Disorder of kidney and ureter, unspecified: Secondary | ICD-10-CM | POA: Diagnosis not present

## 2019-10-27 DIAGNOSIS — R5383 Other fatigue: Secondary | ICD-10-CM | POA: Diagnosis not present

## 2019-10-27 DIAGNOSIS — M797 Fibromyalgia: Secondary | ICD-10-CM | POA: Diagnosis not present

## 2019-10-27 DIAGNOSIS — I10 Essential (primary) hypertension: Secondary | ICD-10-CM | POA: Diagnosis not present

## 2019-10-27 LAB — CMP (CANCER CENTER ONLY)
ALT: 27 U/L (ref 0–44)
AST: 11 U/L — ABNORMAL LOW (ref 15–41)
Albumin: 3.9 g/dL (ref 3.5–5.0)
Alkaline Phosphatase: 113 U/L (ref 38–126)
Anion gap: 7 (ref 5–15)
BUN: 25 mg/dL — ABNORMAL HIGH (ref 8–23)
CO2: 22 mmol/L (ref 22–32)
Calcium: 9.3 mg/dL (ref 8.9–10.3)
Chloride: 110 mmol/L (ref 98–111)
Creatinine: 1.33 mg/dL — ABNORMAL HIGH (ref 0.44–1.00)
GFR, Est AFR Am: 48 mL/min — ABNORMAL LOW (ref >60)
GFR, Estimated: 42 mL/min — ABNORMAL LOW (ref >60)
Glucose, Bld: 126 mg/dL — ABNORMAL HIGH (ref 70–99)
Potassium: 3.5 mmol/L (ref 3.5–5.1)
Sodium: 139 mmol/L (ref 135–145)
Total Bilirubin: 0.3 mg/dL (ref 0.3–1.2)
Total Protein: 7 g/dL (ref 6.5–8.1)

## 2019-10-27 LAB — CBC WITH DIFFERENTIAL (CANCER CENTER ONLY)
Abs Immature Granulocytes: 0.03 10*3/uL (ref 0.00–0.07)
Basophils Absolute: 0 10*3/uL (ref 0.0–0.1)
Basophils Relative: 0 %
Eosinophils Absolute: 0.1 10*3/uL (ref 0.0–0.5)
Eosinophils Relative: 2 %
HCT: 37.4 % (ref 36.0–46.0)
Hemoglobin: 12.1 g/dL (ref 12.0–15.0)
Immature Granulocytes: 0 %
Lymphocytes Relative: 22 %
Lymphs Abs: 2.1 10*3/uL (ref 0.7–4.0)
MCH: 30.5 pg (ref 26.0–34.0)
MCHC: 32.4 g/dL (ref 30.0–36.0)
MCV: 94.2 fL (ref 80.0–100.0)
Monocytes Absolute: 0.7 10*3/uL (ref 0.1–1.0)
Monocytes Relative: 8 %
Neutro Abs: 6.3 10*3/uL (ref 1.7–7.7)
Neutrophils Relative %: 68 %
Platelet Count: 200 10*3/uL (ref 150–400)
RBC: 3.97 MIL/uL (ref 3.87–5.11)
RDW: 13.9 % (ref 11.5–15.5)
WBC Count: 9.3 10*3/uL (ref 4.0–10.5)
nRBC: 0 % (ref 0.0–0.2)

## 2019-10-27 MED ORDER — HEPARIN SOD (PORK) LOCK FLUSH 100 UNIT/ML IV SOLN
500.0000 [IU] | Freq: Once | INTRAVENOUS | Status: AC
  Administered 2019-10-27: 500 [IU]
  Filled 2019-10-27: qty 5

## 2019-10-27 MED ORDER — SODIUM CHLORIDE 0.9% FLUSH
10.0000 mL | Freq: Once | INTRAVENOUS | Status: AC
  Administered 2019-10-27: 10 mL
  Filled 2019-10-27: qty 10

## 2019-10-27 NOTE — Patient Instructions (Signed)

## 2019-10-30 ENCOUNTER — Inpatient Hospital Stay: Payer: Managed Care, Other (non HMO) | Admitting: Internal Medicine

## 2019-10-30 ENCOUNTER — Encounter: Payer: Self-pay | Admitting: Internal Medicine

## 2019-10-30 ENCOUNTER — Other Ambulatory Visit: Payer: Self-pay

## 2019-10-30 VITALS — BP 146/90 | HR 115 | Temp 97.7°F | Resp 20 | Ht 67.0 in | Wt 173.9 lb

## 2019-10-30 DIAGNOSIS — C3490 Malignant neoplasm of unspecified part of unspecified bronchus or lung: Secondary | ICD-10-CM | POA: Diagnosis not present

## 2019-10-30 DIAGNOSIS — C3492 Malignant neoplasm of unspecified part of left bronchus or lung: Secondary | ICD-10-CM | POA: Diagnosis not present

## 2019-10-30 DIAGNOSIS — C3400 Malignant neoplasm of unspecified main bronchus: Secondary | ICD-10-CM

## 2019-10-30 NOTE — Progress Notes (Signed)
[place] [phone]) [phone]   [phone]) [phone]  OFFICE PROGRESS NOTE  [name], NP [place] [address]  DIAGNOSIS: stage IV (T1a, N0, M1 a) non-small cell lung cancer, adenocarcinoma presented with multifocal disease in both lungs including 3 nodules in the left lung as well as 2 nodules in the right lung diagnosed in December 2019.  No actionable mutations KRAS G12C BCOR N1425S-subclonal RAD21 amplification  PDL 1 expression 1%.  PRIOR THERAPY:  1) Systemic chemotherapy with carboplatin for AUC of 5, Alimta 500 mg/M2 and Keytruda 200 mg IV every 3 weeks. First dose April 12, 2018. Status post 4 cycles. starting from cycle #3, Keytruda was dropped and patient received treatment with Carboplatin for an AUC of 5 and Alimta 500 mg/m2 IV every 3 weeks. 2) Systemic chemotherapy with Alimta 500 mg/m2 IV every 3 weeks. Status post 3 cycles of single agent Alimta. Most recent dose given on 08/23/2018.  The treatment is currently on hold secondary to renal insufficiency.  CURRENT THERAPY:  Observation.  INTERVAL HISTORY: [name] 64 y.o. female returns to the clinic today for follow-up visit.  The patient is feeling fine today with no concerning complaints except for the generalized weakness and she has frequent falls recently.  She is scheduled to see neurology next month.  The patient denied having any current chest pain, shortness of breath, cough or hemoptysis.  She denied having any fever or chills.  She has no nausea, vomiting, diarrhea or constipation.  She has no headache or visual changes.  She had MRI of the brain performed recently that was unremarkable.  The patient also had repeat CT scan of the chest and she is here today for evaluation and discussion of her scan results.  MEDICAL HISTORY: Past Medical History:  Diagnosis Date  . Anemia    "long time ago" (12/06/2012)  . Anxiety   . Arthritis    "right knee  real bad; in my hands bad" (12/06/2012)  . Asthma   . Celiac disease   . Colon polyps   . COPD (chronic obstructive pulmonary disease) ([place])   . Coronary artery disease    a. s/p Xience DES to [facility] 04/2008;  b. LHC 05/2008: Proximal RCA 25%, mid RCA stent patent.  c. Anormal nuc 2014 -> s/p LHC with severe mRCA stenosis s/p DES. d. Cath 04/2013 s/p DES to LAD.  e. LHC 08/2015 was stable.  . Depression    "years ago" (12/06/2012)  . Fibromyalgia   . Gallstones   . GERD (gastroesophageal reflux disease)   . H/O hiatal hernia   . Hepatitis    "not A, B, or C" (12/06/2012)  . Hyperlipidemia    intol to statins and other chol agents due to elevated LFTs  . Hypertension   . Hypothyroidism   . IBS (irritable bowel syndrome)   . Interstitial cystitis   . Lung cancer ([place]) dx'd 01/2018  . Lung nodules    Bilateral  . Migraines    "when I was younger" (12/06/2012)  . Pneumonia   . PONV (postoperative nausea and vomiting)    "Very bad"  . Premature atrial contractions   . PVC's (premature ventricular contractions)   . Sinus headache   . SVT (supraventricular tachycardia) ([facility])     ALLERGIES:  is allergic to ciprofloxacin, levofloxacin, statins, tricor [fenofibrate], compazine [prochlorperazine], latex, codeine, and metoprolol.  MEDICATIONS:  Current Outpatient Medications  Medication Sig Dispense Refill  .  albuterol (PROAIR HFA) 108 (90 Base) MCG/ACT inhaler Inhale 1 puff into the lungs every 6 (six) hours as needed (wheezing/shortness of breath.).     Marland Kitchen albuterol (PROVENTIL) (5 MG/ML) 0.5% nebulizer solution Take 2.5 mg by nebulization every 6 (six) hours as needed for wheezing.    Marland Kitchen aspirin 81 MG chewable tablet Chew 1 tablet (81 mg total) by mouth daily.    . BELSOMRA 20 MG TABS Take 20 mg by mouth at bedtime as needed for sleep.    Marland Kitchen buPROPion (WELLBUTRIN SR) 150 MG 12 hr tablet Take 150 mg by mouth daily.     . DULoxetine (CYMBALTA) 60 MG capsule Take 120 mg by mouth daily.    Marland Kitchen  HYDROcodone-acetaminophen (NORCO) 7.5-325 MG per tablet Take 1 tablet by mouth every 6 (six) hours as needed for pain.    Marland Kitchen ipratropium-albuterol (DUONEB) 0.5-2.5 (3) MG/3ML SOLN Inhale 3 mLs into the lungs every 6 (six) hours as needed (Asthma).     . lidocaine-prilocaine (EMLA) cream Apply 1 application topically as needed. 30 g 0  . loperamide (IMODIUM) 2 MG capsule Take 1 capsule (2 mg total) by mouth as needed for diarrhea or loose stools (Initiate if stool C diff -ve). 30 capsule 0  . LORazepam (ATIVAN) 2 MG tablet Take 2 mg by mouth every evening.     . metoprolol tartrate (LOPRESSOR) 25 MG tablet Take 0.5 tablets (12.5 mg total) by mouth 2 (two) times daily. 90 tablet 3  . montelukast (SINGULAIR) 10 MG tablet Take 10 mg by mouth at bedtime.     . nitroGLYCERIN (NITROSTAT) 0.4 MG SL tablet Place 1 tablet (0.4 mg total) under the tongue every 5 (five) minutes as needed for chest pain. 25 tablet 12  . omeprazole (PRILOSEC) 40 MG capsule You will be due for a yearly follow up visit in March 2021. (Patient taking differently: Take 40 mg by mouth daily. You will be due for a yearly follow up visit in March 2021.) 90 capsule 0  . ondansetron (ZOFRAN) 8 MG tablet Take 1 tablet (8 mg total) by mouth every 8 (eight) hours as needed for nausea or vomiting. 30 tablet 0  . SYMBICORT 160-4.5 MCG/ACT inhaler Take 2 puffs by mouth 2 (two) times daily.  3  . Vitamin D, Ergocalciferol, (DRISDOL) 1.25 MG (50000 UT) CAPS capsule Take 50,000 Units by mouth every 7 (seven) days. Wednesdays     No current facility-administered medications for this visit.    SURGICAL HISTORY:  Past Surgical History:  Procedure Laterality Date  . CARDIAC CATHETERIZATION  04/2008; 05/2008  . CARDIAC CATHETERIZATION N/A 08/07/2015   Procedure: Left Heart Cath and Coronary Angiography;  Surgeon: Sherren Mocha, MD;  Location: [place] CV LAB;  Service: Cardiovascular;  Laterality: N/A;  . CHOLECYSTECTOMY    . CORONARY ANGIOPLASTY  WITH STENT PLACEMENT  04/2008 12/06/2012   "1 + 1" (12/06/2012)  . CORONARY STENT PLACEMENT  05/08/2013   DES TO LAD      DR MCALHANY  . IR IMAGING GUIDED PORT INSERTION  04/29/2018  . LEFT HEART CATHETERIZATION WITH CORONARY ANGIOGRAM N/A 12/06/2012   Procedure: LEFT HEART CATHETERIZATION WITH CORONARY ANGIOGRAM;  Surgeon: [name], MD;  Location: [facility] CATH LAB;  Service: Cardiovascular;  Laterality: N/A;  . LEFT HEART CATHETERIZATION WITH CORONARY ANGIOGRAM N/A 05/08/2013   Procedure: LEFT HEART CATHETERIZATION WITH CORONARY ANGIOGRAM;  Surgeon: [name], MD;  Location: [facility] CATH LAB;  Service: Cardiovascular;  Laterality: N/A;  . LEFT  HEART CATHETERIZATION WITH CORONARY ANGIOGRAM N/A 01/04/2014   Procedure: LEFT HEART CATHETERIZATION WITH CORONARY ANGIOGRAM;  Surgeon: [name], MD;  Location: [facility] CATH LAB;  Service: Cardiovascular;  Laterality: N/A;  . PERCUTANEOUS CORONARY STENT INTERVENTION (PCI-S)  12/06/2012   Procedure: PERCUTANEOUS CORONARY STENT INTERVENTION (PCI-S);  Surgeon: [name], MD;  Location: [facility] CATH LAB;  Service: Cardiovascular;;  . PERCUTANEOUS CORONARY STENT INTERVENTION (PCI-S)  05/08/2013   Procedure: PERCUTANEOUS CORONARY STENT INTERVENTION (PCI-S);  Surgeon: [name], MD;  Location: [facility] CATH LAB;  Service: Cardiovascular;;  mid LAD   . TUBAL LIGATION    . VAGINAL HYSTERECTOMY    . VIDEO BRONCHOSCOPY WITH ENDOBRONCHIAL NAVIGATION N/A 02/17/2018   Procedure: VIDEO BRONCHOSCOPY WITH ENDOBRONCHIAL NAVIGATION;  Surgeon: Melrose Nakayama, MD;  Location: [place];  Service: Thoracic;  Laterality: N/A;    REVIEW OF SYSTEMS:  A comprehensive review of systems was negative except for: Respiratory: positive for dyspnea on exertion Neurological: positive for weakness   PHYSICAL EXAMINATION: General appearance: alert, cooperative, fatigued and no distress Head: Normocephalic, without obvious abnormality, atraumatic Neck:  no adenopathy, no JVD, supple, symmetrical, trachea midline and thyroid not enlarged, symmetric, no tenderness/mass/nodules Lymph nodes: Cervical, supraclavicular, and axillary nodes normal. Resp: clear to auscultation bilaterally Back: symmetric, no curvature. ROM normal. No CVA tenderness. Cardio: regular rate and rhythm, S1, S2 normal, no murmur, click, rub or gallop GI: soft, non-tender; bowel sounds normal; no masses,  no organomegaly Extremities: extremities normal, atraumatic, no cyanosis or edema  ECOG PERFORMANCE STATUS: 1 - Symptomatic but completely ambulatory  Blood pressure (!) 146/90, pulse (!) 115, temperature 97.7 F (36.5 C), temperature source Tympanic, resp. rate 20, height 5' 7"  (1.702 m), weight 173 lb 14.4 oz (78.9 kg), SpO2 94 %.  LABORATORY DATA: Lab Results  Component Value Date   WBC 9.3 10/27/2019   HGB 12.1 10/27/2019   HCT 37.4 10/27/2019   MCV 94.2 10/27/2019   PLT 200 10/27/2019      Chemistry      Component Value Date/Time   NA 139 10/27/2019 1009   NA 143 01/10/2018 1629   K 3.5 10/27/2019 1009   CL 110 10/27/2019 1009   CO2 22 10/27/2019 1009   BUN 25 (H) 10/27/2019 1009   BUN 14 01/10/2018 1629   CREATININE 1.33 (H) 10/27/2019 1009   CREATININE 0.68 08/05/2015 1456      Component Value Date/Time   CALCIUM 9.3 10/27/2019 1009   ALKPHOS 113 10/27/2019 1009   AST 11 (L) 10/27/2019 1009   ALT 27 10/27/2019 1009   BILITOT 0.3 10/27/2019 1009       RADIOGRAPHIC STUDIES: CT Head Wo Contrast  Result Date: 10/11/2019 CLINICAL DATA:  Mental status change of unknown cause. All behavior. On bone skate. History of lung cancer. EXAM: CT HEAD WITHOUT CONTRAST TECHNIQUE: Contiguous axial images were obtained from the base of the skull through the vertex without intravenous contrast. COMPARISON:  May 13, 2018 FINDINGS: Brain: No hemorrhage. No extraaxial collection.No midline shift. There is atrophy.The basal cisterns are unremarkable. Patchy and  confluent areas of decreased attenuation are noted throughout the deep and periventricular white matter of the cerebral hemispheres bilaterally, compatible with chronic microvascular ischemic disease. The brainstem is unremarkable. The cerebellum is unremarkable. The sella is unremarkable. Vascular: No hyperdense vessel or unexpected calcification. Skull: The calvarium is unremarkable. The skull base is unremarkable. The visualized upper cervical spine is unremarkable. Sinuses/Orbits: The visualized orbits are unremarkable. There is a mucosal retention  cyst within the left maxillary sinus. The remaining paranasal sinuses and mastoid air cells are essentially clear. Other: The visualized parotid gland is unremarkable. There is no scalp soft tissue swelling. IMPRESSION: 1. No acute intracranial abnormality. 2. Atrophy and chronic microvascular ischemic disease. Electronically Signed   By: Constance Holster M.D.   On: 10/11/2019 17:34   CT Chest Wo Contrast  Result Date: 10/27/2019 CLINICAL DATA:  Restaging lung cancer. EXAM: CT CHEST WITHOUT CONTRAST TECHNIQUE: Multidetector CT imaging of the chest was performed following the standard protocol without IV contrast. COMPARISON:  06/23/2019 FINDINGS: Cardiovascular: Heart size is normal. Aortic atherosclerosis. Coronary artery calcifications identified. No pericardial effusion. Mediastinum/Nodes: Normal appearance of the thyroid gland. The trachea appears patent and is midline. Normal appearance of the esophagus. No enlarged axillary, supraclavicular, or mediastinal adenopathy. Hilar lymph nodes are suboptimally evaluated without IV contrast. Lungs/Pleura: No pleural effusion identified. No airspace consolidation, atelectasis, or pneumothorax. Centrilobular emphysema. Bilateral lung nodules are again identified. -index nodule within the left upper lobe measures 6 mm, image 39/5. Previously 8 mm. -part solid nodule in the superior segment of the left lower lobe  measures 1 cm with a 5 mm solid component, image 38/5. This is compared with the same previously. -Sub solid nodule in the posterolateral right upper lobe measures 1.6 cm, image 49/5. Previously 1.5 cm. -Within the posterior left upper lobe there is a sub solid nodule measuring 1.4 cm, image 78/5. Unchanged. -Part solid nodule within the posterior, subpleural aspect of the superior segment of left lower lobe measures 2.6 cm, image 52/5. Previously 2.3 cm. Upper Abdomen: No acute findings identified within the imaged portions of the upper abdomen. Previous cholecystectomy. Musculoskeletal: Unchanged appearance superior endplate compression deformity involving the T5 vertebra with loss of approximately 20% of the superior vertebral body height. No aggressive or acute osseous abnormality. IMPRESSION: 1. No significant interval change in the appearance of multifocal sub solid and part solid nodules identified in both lungs. 2. Coronary artery calcifications noted Aortic Atherosclerosis (ICD10-I70.0) and Emphysema (ICD10-J43.9). The. Electronically Signed   By: Kerby Moors M.D.   On: 10/27/2019 12:39   MR BRAIN WO CONTRAST  Result Date: 10/12/2019 CLINICAL DATA:  TIA. Abnormal gait. Blurred vision for 2 weeks. Stage IV lung cancer. EXAM: MRI HEAD WITHOUT CONTRAST TECHNIQUE: Multiplanar, multiecho pulse sequences of the brain and surrounding structures were obtained without intravenous contrast. COMPARISON:  CT head without contrast 10/11/2019 FINDINGS: Brain: Mild atrophy and white matter changes are noted. No acute infarct, hemorrhage, or mass lesion is present. The ventricles are of normal size. No significant extraaxial fluid collection is present. The internal auditory canals are within normal limits. The brainstem and cerebellum are within normal limits. Vascular: Flow is present in the major intracranial arteries. Skull and upper cervical spine: The craniocervical junction is normal. Upper cervical spine is  within normal limits. Marrow signal is unremarkable. Sinuses/Orbits: A polyp or mucous retention cyst is present anteriorly in the left maxillary sinus. The paranasal sinuses and mastoid air cells are otherwise clear. The globes and orbits are within normal limits. IMPRESSION: 1. No acute intracranial abnormality or evidence for metastatic disease to the brain. 2. Mild atrophy and white matter disease likely reflects the sequela of chronic microvascular ischemia. Electronically Signed   By: San Morelle M.D.   On: 10/12/2019 06:18    ASSESSMENT AND PLAN: This is a very pleasant 64 years old white female recently diagnosed with a stage IV non-small cell lung cancer, adenocarcinoma with no  actionable mutations and PDL 1 expression of 1% diagnosed in December 2019 and presented with bilateral pulmonary nodules. The patient was initially started on treatment with carboplatin, Alimta and Keytruda status post 4 cycles but Keytruda was discontinued after cycle #2 secondary to significant skin rash. She was treated with maintenance treatment with single agent Alimta for 3 cycles but this is currently on hold since June 2020 secondary to worsening renal insufficiency. The patient has been on observation since that time and she has no concerning complaints except for the generalized fatigue and few falls. She had MRI of the brain that was unremarkable for any intracranial abnormality or disease progression in the brain. The patient also had CT scan of the chest performed recently.  I personally and independently reviewed the scan and discussed the results with the patient today. Her scan showed no concerning findings for disease progression. I recommended for the patient to continue on observation with repeat CT scan of the chest in 6 months. I will also arrange for the patient to have Port-A-Cath flush every 2 months during this.. She was advised to call immediately if she has any concerning symptoms in  the interval. The patient voices understanding of current disease status and treatment options and is in agreement with the current care plan. All questions were answered. The patient knows to call the clinic with any problems, questions or concerns. We can certainly see the patient much sooner if necessary.   Disclaimer: This note was dictated with voice recognition software. Similar sounding words can inadvertently be transcribed and may not be corrected upon review.

## 2019-12-15 ENCOUNTER — Ambulatory Visit: Payer: Self-pay | Admitting: Neurology

## 2019-12-18 ENCOUNTER — Other Ambulatory Visit: Payer: Self-pay

## 2019-12-18 ENCOUNTER — Ambulatory Visit (INDEPENDENT_AMBULATORY_CARE_PROVIDER_SITE_OTHER): Payer: Managed Care, Other (non HMO) | Admitting: Diagnostic Neuroimaging

## 2019-12-18 ENCOUNTER — Ambulatory Visit: Payer: Self-pay | Admitting: Neurology

## 2019-12-18 ENCOUNTER — Encounter: Payer: Self-pay | Admitting: Diagnostic Neuroimaging

## 2019-12-18 ENCOUNTER — Telehealth: Payer: Self-pay | Admitting: Diagnostic Neuroimaging

## 2019-12-18 VITALS — BP 117/65 | HR 89 | Ht 67.0 in | Wt 160.0 lb

## 2019-12-18 DIAGNOSIS — R251 Tremor, unspecified: Secondary | ICD-10-CM | POA: Diagnosis not present

## 2019-12-18 DIAGNOSIS — I2510 Atherosclerotic heart disease of native coronary artery without angina pectoris: Secondary | ICD-10-CM

## 2019-12-18 DIAGNOSIS — R413 Other amnesia: Secondary | ICD-10-CM

## 2019-12-18 DIAGNOSIS — G959 Disease of spinal cord, unspecified: Secondary | ICD-10-CM | POA: Diagnosis not present

## 2019-12-18 DIAGNOSIS — T451X5A Adverse effect of antineoplastic and immunosuppressive drugs, initial encounter: Secondary | ICD-10-CM

## 2019-12-18 DIAGNOSIS — C3492 Malignant neoplasm of unspecified part of left bronchus or lung: Secondary | ICD-10-CM

## 2019-12-18 DIAGNOSIS — R269 Unspecified abnormalities of gait and mobility: Secondary | ICD-10-CM

## 2019-12-18 DIAGNOSIS — G620 Drug-induced polyneuropathy: Secondary | ICD-10-CM

## 2019-12-18 NOTE — Progress Notes (Signed)
[facility]  PATIENT: [name] DOB: 11/06/1955  REFERRING CLINICIAN: Henderly, Britni A, PA-C HISTORY FROM: patient  REASON FOR VISIT: new consult    HISTORICAL  CHIEF COMPLAINT:  Chief Complaint  Patient presents with  . Falls, memory loss    rm 7 New Pt, hospital FU  MMSE 25    HISTORY OF PRESENT ILLNESS:   64 year old female with stage IV lung cancer, here for evaluation of memory loss and gait difficulty.  Patient was diagnosed with lung cancer in December 2019.  She was treated with chemotherapy in February-June 2020.  In November 2020 she noticed some onset of gait and balance difficulties.  Patient started having increasing falls, confusion and memory loss.  She went to the emergency room in August 2021 for evaluation.  She had MRI of the brain without contrast was unremarkable.  Patient lives at home.  She uses a wheelchair.  Sometimes she is able to use a cane and walker but feels very unsteady.  She also feels generalized tremors and shakiness.   REVIEW OF SYSTEMS: Full 14 system review of systems performed and negative with exception of: As per HPI.  ALLERGIES: Allergies  Allergen Reactions  . Ciprofloxacin Hives  . Levofloxacin     UNSPECIFIED REACTION, BUT LIKELY HIVES > HIVES WITH CIPRO  . Statins Other (See Comments)    Liver enzymes Liver enzymes  . Tricor [Fenofibrate] Hives and Itching  . Compazine [Prochlorperazine]     Hx of tardive dyskinesia with compazine  . Latex Other (See Comments)    Patient states "Messes with my skin."  . Codeine Nausea And Vomiting  . Metoprolol Other (See Comments)    Does not tolerate beta blockers 10/02/2013-pt states she can tolerate 25 mg and lower     HOME MEDICATIONS: Outpatient Medications Prior to Visit  Medication Sig Dispense Refill  . albuterol (PROAIR HFA) 108 (90 Base) MCG/ACT inhaler Inhale 1 puff into the lungs every 6 (six) hours as needed (wheezing/shortness of breath.).      Marland Kitchen albuterol (PROVENTIL) (5 MG/ML) 0.5% nebulizer solution Take 2.5 mg by nebulization every 6 (six) hours as needed for wheezing.    Marland Kitchen aspirin 81 MG chewable tablet Chew 1 tablet (81 mg total) by mouth daily.    . BELSOMRA 20 MG TABS Take 20 mg by mouth at bedtime as needed for sleep.    Marland Kitchen buPROPion (WELLBUTRIN SR) 150 MG 12 hr tablet Take 150 mg by mouth daily.     . DULoxetine (CYMBALTA) 60 MG capsule Take 120 mg by mouth daily.    Marland Kitchen HYDROcodone-acetaminophen (NORCO) 7.5-325 MG per tablet Take 1 tablet by mouth every 6 (six) hours as needed for pain.    Marland Kitchen ipratropium-albuterol (DUONEB) 0.5-2.5 (3) MG/3ML SOLN Inhale 3 mLs into the lungs every 6 (six) hours as needed (Asthma).     . lidocaine-prilocaine (EMLA) cream Apply 1 application topically as needed. 30 g 0  . loperamide (IMODIUM) 2 MG capsule Take 1 capsule (2 mg total) by mouth as needed for diarrhea or loose stools (Initiate if stool C diff -ve). 30 capsule 0  . LORazepam (ATIVAN) 2 MG tablet Take 2 mg by mouth every evening.     . meloxicam (MOBIC) 15 MG tablet meloxicam 15 mg tablet  1 TAB PO QD    . metoprolol tartrate (LOPRESSOR) 25 MG tablet Take 0.5 tablets (12.5 mg total) by mouth 2 (two) times daily. 90 tablet 3  . montelukast (SINGULAIR) 10  MG tablet Take 10 mg by mouth at bedtime.     . nitroGLYCERIN (NITROSTAT) 0.4 MG SL tablet Place 1 tablet (0.4 mg total) under the tongue every 5 (five) minutes as needed for chest pain. 25 tablet 12  . ondansetron (ZOFRAN) 8 MG tablet Take 1 tablet (8 mg total) by mouth every 8 (eight) hours as needed for nausea or vomiting. 30 tablet 0  . SYMBICORT 160-4.5 MCG/ACT inhaler Take 2 puffs by mouth 2 (two) times daily.  3  . Vitamin D, Ergocalciferol, (DRISDOL) 1.25 MG (50000 UT) CAPS capsule Take 50,000 Units by mouth every 7 (seven) days. Wednesdays    . oxybutynin (DITROPAN-XL) 5 MG 24 hr tablet Take 5 mg by mouth daily. (Patient not taking: Reported on 12/18/2019)    . omeprazole  (PRILOSEC) 40 MG capsule You will be due for a yearly follow up visit in March 2021. (Patient taking differently: Take 40 mg by mouth daily. You will be due for a yearly follow up visit in March 2021.) 90 capsule 0   No facility-administered medications prior to visit.    PAST MEDICAL HISTORY: Past Medical History:  Diagnosis Date  . Anemia    "long time ago" (12/06/2012)  . Anxiety   . Arthritis    "right knee real bad; in my hands bad" (12/06/2012)  . Asthma   . Celiac disease   . Colon polyps   . COPD (chronic obstructive pulmonary disease) ([place])   . Coronary artery disease    a. s/p Xience DES to [facility] 04/2008;  b. LHC 05/2008: Proximal RCA 25%, mid RCA stent patent.  c. Anormal nuc 2014 -> s/p LHC with severe mRCA stenosis s/p DES. d. Cath 04/2013 s/p DES to LAD.  e. LHC 08/2015 was stable.  . Depression    "years ago" (12/06/2012)  . Fibromyalgia   . Gallstones   . GERD (gastroesophageal reflux disease)   . H/O hiatal hernia   . Hepatitis    "not A, B, or C" (12/06/2012)  . Hyperlipidemia    intol to statins and other chol agents due to elevated LFTs  . Hypertension   . Hypothyroidism   . IBS (irritable bowel syndrome)   . Interstitial cystitis   . Lung cancer ([place]) dx'd 01/2018  . Lung nodules    Bilateral  . Migraines    "when I was younger" (12/06/2012)  . Pneumonia   . PONV (postoperative nausea and vomiting)    "Very bad"  . Premature atrial contractions   . PVC's (premature ventricular contractions)   . Sinus headache   . SVT (supraventricular tachycardia) ([place])     PAST SURGICAL HISTORY: Past Surgical History:  Procedure Laterality Date  . CARDIAC CATHETERIZATION  04/2008; 05/2008  . CARDIAC CATHETERIZATION N/A 08/07/2015   Procedure: Left Heart Cath and Coronary Angiography;  Surgeon: Sherren Mocha, MD;  Location: [place] [facility];  Service: Cardiovascular;  Laterality: N/A;  . CHOLECYSTECTOMY    . CORONARY ANGIOPLASTY WITH STENT PLACEMENT  04/2008 12/06/2012    "1 + 1" (12/06/2012)  . CORONARY STENT PLACEMENT  05/08/2013   DES TO LAD      DR MCALHANY  . IR IMAGING GUIDED PORT INSERTION  04/29/2018  . LEFT HEART CATHETERIZATION WITH CORONARY ANGIOGRAM N/A 12/06/2012   Procedure: LEFT HEART CATHETERIZATION WITH CORONARY ANGIOGRAM;  Surgeon: [name], MD;  Location: [facility] CATH LAB;  Service: Cardiovascular;  Laterality: N/A;  . LEFT HEART CATHETERIZATION WITH CORONARY ANGIOGRAM N/A 05/08/2013  Procedure: LEFT HEART CATHETERIZATION WITH CORONARY ANGIOGRAM;  Surgeon: [name], MD;  Location: [facility] CATH LAB;  Service: Cardiovascular;  Laterality: N/A;  . LEFT HEART CATHETERIZATION WITH CORONARY ANGIOGRAM N/A 01/04/2014   Procedure: LEFT HEART CATHETERIZATION WITH CORONARY ANGIOGRAM;  Surgeon: [name], MD;  Location: [facility] CATH LAB;  Service: Cardiovascular;  Laterality: N/A;  . PERCUTANEOUS CORONARY STENT INTERVENTION (PCI-S)  12/06/2012   Procedure: PERCUTANEOUS CORONARY STENT INTERVENTION (PCI-S);  Surgeon: [name], MD;  Location: [facility] CATH LAB;  Service: Cardiovascular;;  . PERCUTANEOUS CORONARY STENT INTERVENTION (PCI-S)  05/08/2013   Procedure: PERCUTANEOUS CORONARY STENT INTERVENTION (PCI-S);  Surgeon: [name], MD;  Location: [facility] CATH LAB;  Service: Cardiovascular;;  mid LAD   . TUBAL LIGATION    . VAGINAL HYSTERECTOMY    . VIDEO BRONCHOSCOPY WITH ENDOBRONCHIAL NAVIGATION N/A 02/17/2018   Procedure: VIDEO BRONCHOSCOPY WITH ENDOBRONCHIAL NAVIGATION;  Surgeon: Melrose Nakayama, MD;  [facility];  Service: Thoracic;  Laterality: N/A;    FAMILY HISTORY: Family History  Problem Relation Age of Onset  . Colon cancer Father 34  . Lung disease Father   . Emphysema Mother   . Drug abuse Brother   . Hepatitis C Brother   . Drug abuse Brother   . Celiac disease Son   . Celiac disease Grandson   . Down syndrome Grandson   . Diabetes Granddaughter   . CAD Neg Hx     SOCIAL HISTORY: Social  History   Socioeconomic History  . Marital status: Married    Spouse name: Laverna Peace  . Number of children: 4  . Years of education: Not on file  . Highest education level: Bachelor's degree (e.g., BA, AB, BS)  Occupational History    Comment: owned a day care  Tobacco Use  . Smoking status: Former Smoker    Packs/day: 0.50    Years: 30.00    Pack years: 15.00    Types: Cigarettes    Quit date: 2009    Years since quitting: 12.8  . Smokeless tobacco: Never Used  . Tobacco comment: 12/06/2012 "quit smoking cigarettes ~ 8 yr ago"  Vaping Use  . Vaping Use: Never used  Substance and Sexual Activity  . Alcohol use: No  . Drug use: No  . Sexual activity: Yes  Other Topics Concern  . Not on file  Social History Narrative   Lives with husband   Social Determinants of Health   Financial Resource Strain:   . Difficulty of Paying Living Expenses: Not on file  Food Insecurity:   . Worried About Charity fundraiser in the Last Year: Not on file  . Ran Out of Food in the Last Year: Not on file  Transportation Needs:   . Lack of Transportation (Medical): Not on file  . Lack of Transportation (Non-Medical): Not on file  Physical Activity:   . Days of Exercise per Week: Not on file  . Minutes of Exercise per Session: Not on file  Stress:   . Feeling of Stress : Not on file  Social Connections:   . Frequency of Communication with Friends and Family: Not on file  . Frequency of Social Gatherings with Friends and Family: Not on file  . Attends Religious Services: Not on file  . Active Member of Clubs or Organizations: Not on file  . Attends Archivist Meetings: Not on file  . Marital Status: Not on file  Intimate Partner Violence:   . Fear  of Current or Ex-Partner: Not on file  . Emotionally Abused: Not on file  . Physically Abused: Not on file  . Sexually Abused: Not on file     PHYSICAL EXAM  GENERAL EXAM/CONSTITUTIONAL: Vitals:  Vitals:   12/18/19 1239  BP:  117/65  Pulse: 89  Weight: 160 lb (72.6 kg)  Height: 5' 7"  (1.702 m)     Body mass index is 25.06 kg/m. Wt Readings from Last 3 Encounters:  12/18/19 160 lb (72.6 kg)  10/30/19 173 lb 14.4 oz (78.9 kg)  10/11/19 165 lb (74.8 kg)     Patient is in no distress; well developed, nourished and groomed; neck is supple  CARDIOVASCULAR:  Examination of carotid arteries is normal; no carotid bruits  Regular rate and rhythm, no murmurs  Examination of peripheral vascular system by observation and palpation is normal  EYES:  Ophthalmoscopic exam of optic discs and posterior segments is normal; no papilledema or hemorrhages  No exam data present  MUSCULOSKELETAL:  Gait, strength, tone, movements noted in Neurologic exam below  NEUROLOGIC: MENTAL STATUS:  MMSE - [place] Exam 12/18/2019  Orientation to time 4  Orientation to Place 4  Registration 3  Attention/ Calculation 4  Recall 2  Language- name 2 objects 2  Language- repeat 0  Language- follow 3 step command 3  Language- read & follow direction 1  Write a sentence 1  Copy design 1  Total score 25    awake, alert, oriented to person, place and time  recent and remote memory intact  normal attention and concentration  language fluent, comprehension intact, naming intact  fund of knowledge appropriate  CRANIAL NERVE:   2nd - no papilledema on fundoscopic exam  2nd, 3rd, 4th, 6th - pupils equal and reactive to light, visual fields full to confrontation, extraocular muscles intact, no nystagmus  5th - facial sensation symmetric  7th - facial strength symmetric  8th - hearing intact  9th - palate elevates symmetrically, uvula midline  11th - shoulder shrug symmetric  12th - tongue protrusion midline  MOTOR:   GENERALIZED RESTLESSNESS AND TREMORS  INCREASED TONE IN BLE  BUE 4  BLE 3  SENSORY:   normal and symmetric to light touch, pinprick, temperature, vibration  COORDINATION:    finger-nose-finger, fine finger movements normal  REFLEXES:   deep tendon reflexes --> BRISK IN LOWER >> UPPER EXT  GAIT/STATION:   IN WHEEL CHAIR     DIAGNOSTIC DATA (LABS, IMAGING, TESTING) - I reviewed patient records, labs, notes, testing and imaging myself where available.  Lab Results  Component Value Date   WBC 9.3 10/27/2019   HGB 12.1 10/27/2019   HCT 37.4 10/27/2019   MCV 94.2 10/27/2019   PLT 200 10/27/2019      Component Value Date/Time   NA 139 10/27/2019 1009   NA 143 01/10/2018 1629   K 3.5 10/27/2019 1009   CL 110 10/27/2019 1009   CO2 22 10/27/2019 1009   GLUCOSE 126 (H) 10/27/2019 1009   BUN 25 (H) 10/27/2019 1009   BUN 14 01/10/2018 1629   CREATININE 1.33 (H) 10/27/2019 1009   CREATININE 0.68 08/05/2015 1456   CALCIUM 9.3 10/27/2019 1009   PROT 7.0 10/27/2019 1009   PROT 6.7 02/01/2018 1539   ALBUMIN 3.9 10/27/2019 1009   ALBUMIN 4.4 02/01/2018 1539   AST 11 (L) 10/27/2019 1009   ALT 27 10/27/2019 1009   ALKPHOS 113 10/27/2019 1009   BILITOT 0.3 10/27/2019 1009  GFRNONAA 42 (L) 10/27/2019 1009   GFRAA 48 (L) 10/27/2019 1009   Lab Results  Component Value Date   CHOL 284 (H) 02/01/2018   HDL 51 02/01/2018   LDLCALC 183 (H) 02/01/2018   LDLDIRECT 198 (H) 02/01/2018   TRIG 248 (H) 02/01/2018   CHOLHDL 5.6 (H) 02/01/2018   No results found for: HGBA1C Lab Results  Component Value Date   VITAMINB12 139 (L) 05/13/2018   Lab Results  Component Value Date   TSH 0.544 06/14/2018    10/12/19 MRI brain (without)  1. No acute intracranial abnormality or evidence for metastatic disease to the brain. 2. Mild atrophy and white matter disease likely reflects the sequela of chronic microvascular ischemia.    ASSESSMENT AND PLAN  64 y.o. year old female here with stage IV lung cancer diagnosed in December 2019, on chemotherapy from February to June 2020, with progressive gait and balance difficulty since that time.  Could be side  effects of chemotherapy.  We will check additional testing to rule out other secondary causes.  Of note patient was noted to have a low B12 level in 2020, but she is currently on oral replacement therapy.  Dx:  1. Gait difficulty   2. Memory loss   3. Tremor   4. Disease of spinal cord ([place])   5. Adenocarcinoma of left lung, stage 4 ([place])   6. Chemotherapy-induced neuropathy ([facility])      PLAN:  MEMORY LOSS / GAIT DIFFICULTY (stage IV lung cancer, chemo-neuropathy, B12 deficiency) - check MRI brain w/wo (last scan done without contrast) - check MRI cervical w/wo (rule out myelopathy) - check B12 level (previously low at 139 in 2020) - then consider home PT evaluation; use cane / walker  Orders Placed This Encounter  Procedures  . MR BRAIN W WO CONTRAST  . MR CERVICAL SPINE W WO CONTRAST  . Vitamin B12  . TSH  . Hemoglobin A1c   Return for pending if symptoms worsen or fail to improve.    Penni Bombard, MD 67/01/4579, 9:98 PM Certified in Neurology, Neurophysiology and Neuroimaging  [facility] [address] [place], [place] [address] [phone]

## 2019-12-18 NOTE — Telephone Encounter (Signed)
Medicare/cigna order sent to [facility]. They will obtain the auth for [employer] and reach out to the patient to schedule.

## 2019-12-19 ENCOUNTER — Encounter: Payer: Self-pay | Admitting: *Deleted

## 2019-12-19 LAB — HEMOGLOBIN A1C
Est. average glucose Bld gHb Est-mCnc: 120 mg/dL
Hgb A1c MFr Bld: 5.8 % — ABNORMAL HIGH (ref 4.8–5.6)

## 2019-12-19 LAB — VITAMIN B12: Vitamin B-12: 258 pg/mL (ref 232–1245)

## 2019-12-19 LAB — TSH: TSH: 4.74 u[IU]/mL — ABNORMAL HIGH (ref 0.450–4.500)

## 2019-12-19 NOTE — Telephone Encounter (Signed)
[name]: [id number] (exp. 12/19/19 to 03/18/20)

## 2019-12-28 ENCOUNTER — Inpatient Hospital Stay: Payer: Managed Care, Other (non HMO) | Attending: Internal Medicine

## 2019-12-28 ENCOUNTER — Other Ambulatory Visit: Payer: Self-pay | Admitting: Cardiovascular Disease

## 2020-01-13 ENCOUNTER — Inpatient Hospital Stay: Admission: RE | Admit: 2020-01-13 | Payer: Managed Care, Other (non HMO) | Source: Ambulatory Visit

## 2020-01-15 ENCOUNTER — Telehealth: Payer: Self-pay | Admitting: Diagnostic Neuroimaging

## 2020-01-15 MED ORDER — ALPRAZOLAM 0.5 MG PO TABS
ORAL_TABLET | ORAL | 0 refills | Status: DC
Start: 2020-01-15 — End: 2020-09-13

## 2020-01-15 NOTE — Addendum Note (Signed)
Addended by: Andrey Spearman R on: 01/15/2020 05:33 PM   Modules accepted: Orders

## 2020-01-15 NOTE — Telephone Encounter (Signed)
Meds ordered this encounter  Medications  . ALPRAZolam (XANAX) 0.5 MG tablet    Sig: for sedation before MRI scan; take 1 tab 1 hour before scan; may repeat 1 tab 15 min before scan    Dispense:  3 tablet    Refill:  0    Penni Bombard, MD 12/50/8719, 9:41 PM Certified in Neurology, Neurophysiology and Neuroimaging  [facility] [address] [place], [place] [address] [phone]

## 2020-01-15 NOTE — Telephone Encounter (Signed)
Pt is asking for something to be called in for her to take for her MRI tomorrow morning.  Pt would like it called into [facility] #[id number]

## 2020-01-16 ENCOUNTER — Other Ambulatory Visit: Payer: Managed Care, Other (non HMO)

## 2020-01-16 ENCOUNTER — Ambulatory Visit
Admission: RE | Admit: 2020-01-16 | Discharge: 2020-01-16 | Disposition: A | Discharge status: Home / Self Care | Payer: Managed Care, Other (non HMO) | Source: Ambulatory Visit | Attending: Diagnostic Neuroimaging | Admitting: Diagnostic Neuroimaging

## 2020-01-16 ENCOUNTER — Encounter: Payer: Self-pay | Admitting: *Deleted

## 2020-01-16 ENCOUNTER — Other Ambulatory Visit: Payer: Self-pay

## 2020-01-16 DIAGNOSIS — R413 Other amnesia: Secondary | ICD-10-CM

## 2020-01-16 DIAGNOSIS — R251 Tremor, unspecified: Secondary | ICD-10-CM

## 2020-01-16 DIAGNOSIS — T451X5A Adverse effect of antineoplastic and immunosuppressive drugs, initial encounter: Secondary | ICD-10-CM

## 2020-01-16 DIAGNOSIS — G959 Disease of spinal cord, unspecified: Secondary | ICD-10-CM

## 2020-01-16 DIAGNOSIS — G620 Drug-induced polyneuropathy: Secondary | ICD-10-CM

## 2020-01-16 DIAGNOSIS — C3492 Malignant neoplasm of unspecified part of left bronchus or lung: Secondary | ICD-10-CM

## 2020-01-16 DIAGNOSIS — R269 Unspecified abnormalities of gait and mobility: Secondary | ICD-10-CM | POA: Diagnosis not present

## 2020-01-16 MED ORDER — DIAZEPAM 5 MG PO TABS
10.0000 mg | ORAL_TABLET | Freq: Once | ORAL | Status: AC
  Administered 2020-01-16: 10 mg via ORAL

## 2020-01-16 MED ORDER — SODIUM CHLORIDE 0.9% FLUSH
10.0000 mL | INTRAVENOUS | Status: DC | PRN
  Administered 2020-01-16: 10 mL via INTRAVENOUS

## 2020-01-16 MED ORDER — GADOBENATE DIMEGLUMINE 529 MG/ML IV SOLN
15.0000 mL | Freq: Once | INTRAVENOUS | Status: AC | PRN
  Administered 2020-01-16: 15 mL via INTRAVENOUS

## 2020-01-16 MED ORDER — HEPARIN SOD (PORK) LOCK FLUSH 100 UNIT/ML IV SOLN
500.0000 [IU] | Freq: Once | INTRAVENOUS | Status: AC
  Administered 2020-01-16: 500 [IU] via INTRAVENOUS

## 2020-01-16 NOTE — Telephone Encounter (Signed)
Sent my chart advising of Rx for MRI. Advised she must have a driver.

## 2020-01-23 NOTE — Telephone Encounter (Signed)
Left detailed VM advising patient of [facility] plan per office note. Advised her pending results of all her tests he'll decide next steps. Left # for further questions.

## 2020-01-23 NOTE — Telephone Encounter (Signed)
Pt. is asking should she have a MRI for lower back since that's where she has the most pain. Please advise.

## 2020-02-27 ENCOUNTER — Inpatient Hospital Stay: Payer: Managed Care, Other (non HMO) | Attending: Internal Medicine

## 2020-02-27 ENCOUNTER — Other Ambulatory Visit: Payer: Self-pay

## 2020-02-27 DIAGNOSIS — Z95828 Presence of other vascular implants and grafts: Secondary | ICD-10-CM

## 2020-02-27 DIAGNOSIS — C3492 Malignant neoplasm of unspecified part of left bronchus or lung: Secondary | ICD-10-CM | POA: Diagnosis not present

## 2020-02-27 DIAGNOSIS — Z452 Encounter for adjustment and management of vascular access device: Secondary | ICD-10-CM | POA: Diagnosis not present

## 2020-02-27 DIAGNOSIS — C3491 Malignant neoplasm of unspecified part of right bronchus or lung: Secondary | ICD-10-CM | POA: Diagnosis present

## 2020-02-27 MED ORDER — SODIUM CHLORIDE 0.9% FLUSH
10.0000 mL | Freq: Once | INTRAVENOUS | Status: AC
  Administered 2020-02-27: 10 mL
  Filled 2020-02-27: qty 10

## 2020-02-27 MED ORDER — HEPARIN SOD (PORK) LOCK FLUSH 100 UNIT/ML IV SOLN
500.0000 [IU] | Freq: Once | INTRAVENOUS | Status: AC
  Administered 2020-02-27: 500 [IU]
  Filled 2020-02-27: qty 5

## 2020-03-04 ENCOUNTER — Encounter: Payer: Self-pay | Admitting: *Deleted

## 2020-03-27 ENCOUNTER — Other Ambulatory Visit: Payer: Self-pay | Admitting: Cardiovascular Disease

## 2020-04-25 ENCOUNTER — Telehealth: Payer: Self-pay | Admitting: Medical Oncology

## 2020-04-25 NOTE — Telephone Encounter (Signed)
Requested appt for CT scan at [facility]. Auth# [id number] .  CT scan scheduled  for march 8th at [place].Pt  to arrive at 2 pm. Schedule message sent to schedule labs an hour before CT scan.

## 2020-04-26 ENCOUNTER — Ambulatory Visit (HOSPITAL_COMMUNITY): Admission: RE | Admit: 2020-04-26 | Payer: Managed Care, Other (non HMO) | Source: Ambulatory Visit

## 2020-04-26 ENCOUNTER — Inpatient Hospital Stay: Payer: Managed Care, Other (non HMO)

## 2020-04-29 ENCOUNTER — Ambulatory Visit: Payer: Managed Care, Other (non HMO) | Admitting: Internal Medicine

## 2020-05-07 ENCOUNTER — Other Ambulatory Visit: Payer: Self-pay

## 2020-05-07 ENCOUNTER — Ambulatory Visit
Admission: RE | Admit: 2020-05-07 | Discharge: 2020-05-07 | Disposition: A | Discharge status: Home / Self Care | Payer: Managed Care, Other (non HMO) | Source: Ambulatory Visit | Attending: Internal Medicine | Admitting: Internal Medicine

## 2020-05-07 ENCOUNTER — Inpatient Hospital Stay: Payer: Managed Care, Other (non HMO) | Attending: Internal Medicine

## 2020-05-07 ENCOUNTER — Inpatient Hospital Stay: Payer: Managed Care, Other (non HMO)

## 2020-05-07 DIAGNOSIS — I2510 Atherosclerotic heart disease of native coronary artery without angina pectoris: Secondary | ICD-10-CM | POA: Insufficient documentation

## 2020-05-07 DIAGNOSIS — M797 Fibromyalgia: Secondary | ICD-10-CM | POA: Insufficient documentation

## 2020-05-07 DIAGNOSIS — K589 Irritable bowel syndrome without diarrhea: Secondary | ICD-10-CM | POA: Diagnosis not present

## 2020-05-07 DIAGNOSIS — Z79899 Other long term (current) drug therapy: Secondary | ICD-10-CM | POA: Insufficient documentation

## 2020-05-07 DIAGNOSIS — K219 Gastro-esophageal reflux disease without esophagitis: Secondary | ICD-10-CM | POA: Diagnosis not present

## 2020-05-07 DIAGNOSIS — C3492 Malignant neoplasm of unspecified part of left bronchus or lung: Secondary | ICD-10-CM

## 2020-05-07 DIAGNOSIS — Z791 Long term (current) use of non-steroidal anti-inflammatories (NSAID): Secondary | ICD-10-CM | POA: Diagnosis not present

## 2020-05-07 DIAGNOSIS — I10 Essential (primary) hypertension: Secondary | ICD-10-CM | POA: Insufficient documentation

## 2020-05-07 DIAGNOSIS — Z7951 Long term (current) use of inhaled steroids: Secondary | ICD-10-CM | POA: Insufficient documentation

## 2020-05-07 DIAGNOSIS — E039 Hypothyroidism, unspecified: Secondary | ICD-10-CM | POA: Insufficient documentation

## 2020-05-07 DIAGNOSIS — E785 Hyperlipidemia, unspecified: Secondary | ICD-10-CM | POA: Diagnosis not present

## 2020-05-07 DIAGNOSIS — F418 Other specified anxiety disorders: Secondary | ICD-10-CM | POA: Insufficient documentation

## 2020-05-07 DIAGNOSIS — C3491 Malignant neoplasm of unspecified part of right bronchus or lung: Secondary | ICD-10-CM | POA: Insufficient documentation

## 2020-05-07 DIAGNOSIS — R5383 Other fatigue: Secondary | ICD-10-CM | POA: Diagnosis not present

## 2020-05-07 DIAGNOSIS — Z9049 Acquired absence of other specified parts of digestive tract: Secondary | ICD-10-CM | POA: Diagnosis not present

## 2020-05-07 DIAGNOSIS — Z7982 Long term (current) use of aspirin: Secondary | ICD-10-CM | POA: Diagnosis not present

## 2020-05-07 DIAGNOSIS — N289 Disorder of kidney and ureter, unspecified: Secondary | ICD-10-CM | POA: Diagnosis not present

## 2020-05-07 DIAGNOSIS — Z95828 Presence of other vascular implants and grafts: Secondary | ICD-10-CM

## 2020-05-07 DIAGNOSIS — C3490 Malignant neoplasm of unspecified part of unspecified bronchus or lung: Secondary | ICD-10-CM

## 2020-05-07 LAB — CBC WITH DIFFERENTIAL (CANCER CENTER ONLY)
Abs Immature Granulocytes: 0.05 10*3/uL (ref 0.00–0.07)
Basophils Absolute: 0.1 10*3/uL (ref 0.0–0.1)
Basophils Relative: 1 %
Eosinophils Absolute: 0.6 10*3/uL — ABNORMAL HIGH (ref 0.0–0.5)
Eosinophils Relative: 7 %
HCT: 36.5 % (ref 36.0–46.0)
Hemoglobin: 11.7 g/dL — ABNORMAL LOW (ref 12.0–15.0)
Immature Granulocytes: 1 %
Lymphocytes Relative: 18 %
Lymphs Abs: 1.7 10*3/uL (ref 0.7–4.0)
MCH: 30.5 pg (ref 26.0–34.0)
MCHC: 32.1 g/dL (ref 30.0–36.0)
MCV: 95.1 fL (ref 80.0–100.0)
Monocytes Absolute: 0.8 10*3/uL (ref 0.1–1.0)
Monocytes Relative: 8 %
Neutro Abs: 6.3 10*3/uL (ref 1.7–7.7)
Neutrophils Relative %: 65 %
Platelet Count: 258 10*3/uL (ref 150–400)
RBC: 3.84 MIL/uL — ABNORMAL LOW (ref 3.87–5.11)
RDW: 13.5 % (ref 11.5–15.5)
WBC Count: 9.5 10*3/uL (ref 4.0–10.5)
nRBC: 0 % (ref 0.0–0.2)

## 2020-05-07 LAB — CMP (CANCER CENTER ONLY)
ALT: 10 U/L (ref 0–44)
AST: 13 U/L — ABNORMAL LOW (ref 15–41)
Albumin: 3.7 g/dL (ref 3.5–5.0)
Alkaline Phosphatase: 87 U/L (ref 38–126)
Anion gap: 9 (ref 5–15)
BUN: 24 mg/dL — ABNORMAL HIGH (ref 8–23)
CO2: 24 mmol/L (ref 22–32)
Calcium: 9 mg/dL (ref 8.9–10.3)
Chloride: 108 mmol/L (ref 98–111)
Creatinine: 1.12 mg/dL — ABNORMAL HIGH (ref 0.44–1.00)
GFR, Estimated: 55 mL/min — ABNORMAL LOW (ref >60)
Glucose, Bld: 126 mg/dL — ABNORMAL HIGH (ref 70–99)
Potassium: 4.4 mmol/L (ref 3.5–5.1)
Sodium: 141 mmol/L (ref 135–145)
Total Bilirubin: 0.4 mg/dL (ref 0.3–1.2)
Total Protein: 7.1 g/dL (ref 6.5–8.1)

## 2020-05-07 MED ORDER — HEPARIN SOD (PORK) LOCK FLUSH 100 UNIT/ML IV SOLN
500.0000 [IU] | Freq: Once | INTRAVENOUS | Status: AC
  Administered 2020-05-07: 500 [IU]
  Filled 2020-05-07: qty 5

## 2020-05-07 MED ORDER — SODIUM CHLORIDE 0.9% FLUSH
10.0000 mL | Freq: Once | INTRAVENOUS | Status: AC
  Administered 2020-05-07: 10 mL
  Filled 2020-05-07: qty 10

## 2020-05-08 ENCOUNTER — Inpatient Hospital Stay: Payer: Managed Care, Other (non HMO) | Admitting: Internal Medicine

## 2020-05-08 VITALS — BP 133/91 | HR 108 | Temp 97.9°F | Resp 18 | Ht 67.0 in | Wt 166.0 lb

## 2020-05-08 DIAGNOSIS — C3492 Malignant neoplasm of unspecified part of left bronchus or lung: Secondary | ICD-10-CM

## 2020-05-08 DIAGNOSIS — C3490 Malignant neoplasm of unspecified part of unspecified bronchus or lung: Secondary | ICD-10-CM

## 2020-05-08 NOTE — Progress Notes (Signed)
[place] [phone]   [phone]  OFFICE PROGRESS NOTE  [name], NP [place] [address]  DIAGNOSIS: stage IV (T1a, N0, M1 a) non-small cell lung cancer, adenocarcinoma presented with multifocal disease in both lungs including 3 nodules in the left lung as well as 2 nodules in the right lung diagnosed in December 2019.  No actionable mutations KRAS G12C BCOR N1425S-subclonal RAD21 amplification  PDL 1 expression 1%.  PRIOR THERAPY:  1) Systemic chemotherapy with carboplatin for AUC of 5, Alimta 500 mg/M2 and Keytruda 200 mg IV every 3 weeks. First dose April 12, 2018. Status post 4 cycles. starting from cycle #3, Keytruda was dropped and patient received treatment with Carboplatin for an AUC of 5 and Alimta 500 mg/m2 IV every 3 weeks. 2) Systemic chemotherapy with Alimta 500 mg/m2 IV every 3 weeks. Status post 3 cycles of single agent Alimta. Most recent dose given on 08/23/2018.  The treatment is currently on hold secondary to renal insufficiency.  CURRENT THERAPY:  Observation.  INTERVAL HISTORY: [name] 65 y.o. female returns to the clinic today for 6 months follow-up visit.  The patient is feeling fine today with no concerning complaints except for the baseline shortness of breath and intermittent right flank pain.  She denied having any chest pain, cough or hemoptysis.  She has history of COPD followed by Dr. Chase Caller and also history of coronary artery disease with several stent placement in the past.  She denied having any current nausea, vomiting, diarrhea or constipation.  She denied having any headache or visual changes.  She has no weight loss or night sweats.  The patient had repeat CT scan of the chest performed recently and she is here for evaluation and discussion of her risk her results.  MEDICAL HISTORY: Past Medical History:  Diagnosis Date  . Anemia    "long time ago" (12/06/2012)   . Anxiety   . Arthritis    "right knee real bad; in my hands bad" (12/06/2012)  . Asthma   . Celiac disease   . Colon polyps   . COPD (chronic obstructive pulmonary disease) ([place])   . Coronary artery disease    a. s/p Xience DES to [facility] 04/2008;  b. LHC 05/2008: Proximal RCA 25%, mid RCA stent patent.  c. Anormal nuc 2014 -> s/p LHC with severe mRCA stenosis s/p DES. d. Cath 04/2013 s/p DES to LAD.  e. LHC 08/2015 was stable.  . Depression    "years ago" (12/06/2012)  . Fibromyalgia   . Gallstones   . GERD (gastroesophageal reflux disease)   . H/O hiatal hernia   . Hepatitis    "not A, B, or C" (12/06/2012)  . Hyperlipidemia    intol to statins and other chol agents due to elevated LFTs  . Hypertension   . Hypothyroidism   . IBS (irritable bowel syndrome)   . Interstitial cystitis   . Lung cancer ([place]) dx'd 01/2018  . Lung nodules    Bilateral  . Migraines    "when I was younger" (12/06/2012)  . Pneumonia   . PONV (postoperative nausea and vomiting)    "Very bad"  . Premature atrial contractions   . PVC's (premature ventricular contractions)   . Sinus headache   . SVT (supraventricular tachycardia) ([facility])     ALLERGIES:  is allergic to ciprofloxacin, levofloxacin, statins, tricor [fenofibrate], compazine [prochlorperazine], latex, codeine, and metoprolol.  MEDICATIONS:  Current Outpatient Medications  Medication Sig Dispense Refill  . albuterol (PROAIR HFA) 108 (90 Base) MCG/ACT inhaler Inhale 1 puff into the lungs every 6 (six) hours as needed (wheezing/shortness of breath.).     Marland Kitchen albuterol (PROVENTIL) (5 MG/ML) 0.5% nebulizer solution Take 2.5 mg by nebulization every 6 (six) hours as needed for wheezing.    Marland Kitchen ALPRAZolam (XANAX) 0.5 MG tablet for sedation before MRI scan; take 1 tab 1 hour before scan; may repeat 1 tab 15 min before scan 3 tablet 0  . aspirin 81 MG chewable tablet Chew 1 tablet (81 mg total) by mouth daily.    . BELSOMRA 20 MG TABS Take 20 mg by mouth at  bedtime as needed for sleep.    Marland Kitchen buPROPion (WELLBUTRIN SR) 150 MG 12 hr tablet Take 150 mg by mouth daily.     . DULoxetine (CYMBALTA) 60 MG capsule Take 120 mg by mouth daily.    Marland Kitchen HYDROcodone-acetaminophen (NORCO) 7.5-325 MG per tablet Take 1 tablet by mouth every 6 (six) hours as needed for pain.    Marland Kitchen ipratropium-albuterol (DUONEB) 0.5-2.5 (3) MG/3ML SOLN Inhale 3 mLs into the lungs every 6 (six) hours as needed (Asthma).     . lidocaine-prilocaine (EMLA) cream Apply 1 application topically as needed. 30 g 0  . loperamide (IMODIUM) 2 MG capsule Take 1 capsule (2 mg total) by mouth as needed for diarrhea or loose stools (Initiate if stool C diff -ve). 30 capsule 0  . LORazepam (ATIVAN) 2 MG tablet Take 2 mg by mouth every evening.     . meloxicam (MOBIC) 15 MG tablet meloxicam 15 mg tablet  1 TAB PO QD    . metoprolol tartrate (LOPRESSOR) 25 MG tablet TAKE 1/2 TABLET(12.5 MG) BY MOUTH TWICE DAILY 90 tablet 0  . montelukast (SINGULAIR) 10 MG tablet Take 10 mg by mouth at bedtime.     . nitroGLYCERIN (NITROSTAT) 0.4 MG SL tablet Place 1 tablet (0.4 mg total) under the tongue every 5 (five) minutes as needed for chest pain. 25 tablet 12  . ondansetron (ZOFRAN) 8 MG tablet Take 1 tablet (8 mg total) by mouth every 8 (eight) hours as needed for nausea or vomiting. 30 tablet 0  . oxybutynin (DITROPAN-XL) 5 MG 24 hr tablet Take 5 mg by mouth daily. (Patient not taking: Reported on 12/18/2019)    . SYMBICORT 160-4.5 MCG/ACT inhaler Take 2 puffs by mouth 2 (two) times daily.  3  . Vitamin D, Ergocalciferol, (DRISDOL) 1.25 MG (50000 UT) CAPS capsule Take 50,000 Units by mouth every 7 (seven) days. Wednesdays     No current facility-administered medications for this visit.    SURGICAL HISTORY:  Past Surgical History:  Procedure Laterality Date  . CARDIAC CATHETERIZATION  04/2008; 05/2008  . CARDIAC CATHETERIZATION N/A 08/07/2015   Procedure: Left Heart Cath and Coronary Angiography;  Surgeon: Sherren Mocha, MD;  Location: [place] CV LAB;  Service: Cardiovascular;  Laterality: N/A;  . CHOLECYSTECTOMY    . CORONARY ANGIOPLASTY WITH STENT PLACEMENT  04/2008 12/06/2012   "1 + 1" (12/06/2012)  . CORONARY STENT PLACEMENT  05/08/2013   DES TO LAD      DR MCALHANY  . IR IMAGING GUIDED PORT INSERTION  04/29/2018  . LEFT HEART CATHETERIZATION WITH CORONARY ANGIOGRAM N/A 12/06/2012   Procedure: LEFT HEART CATHETERIZATION WITH CORONARY ANGIOGRAM;  Surgeon: [name], MD;  Location: [facility] CATH LAB;  Service: Cardiovascular;  Laterality: N/A;  . LEFT HEART CATHETERIZATION WITH CORONARY ANGIOGRAM N/A 05/08/2013  Procedure: LEFT HEART CATHETERIZATION WITH CORONARY ANGIOGRAM;  Surgeon: [name], MD;  Location: [facility] CATH LAB;  Service: Cardiovascular;  Laterality: N/A;  . LEFT HEART CATHETERIZATION WITH CORONARY ANGIOGRAM N/A 01/04/2014   Procedure: LEFT HEART CATHETERIZATION WITH CORONARY ANGIOGRAM;  Surgeon: [name], MD;  Location: [facility] CATH LAB;  Service: Cardiovascular;  Laterality: N/A;  . PERCUTANEOUS CORONARY STENT INTERVENTION (PCI-S)  12/06/2012   Procedure: PERCUTANEOUS CORONARY STENT INTERVENTION (PCI-S);  Surgeon: [name], MD;  Location: [facility] CATH LAB;  Service: Cardiovascular;;  . PERCUTANEOUS CORONARY STENT INTERVENTION (PCI-S)  05/08/2013   Procedure: PERCUTANEOUS CORONARY STENT INTERVENTION (PCI-S);  Surgeon: [name], MD;  Location: [facility] CATH LAB;  Service: Cardiovascular;;  mid LAD   . TUBAL LIGATION    . VAGINAL HYSTERECTOMY    . VIDEO BRONCHOSCOPY WITH ENDOBRONCHIAL NAVIGATION N/A 02/17/2018   Procedure: VIDEO BRONCHOSCOPY WITH ENDOBRONCHIAL NAVIGATION;  Surgeon: Melrose Nakayama, MD;  Location: [place];  Service: Thoracic;  Laterality: N/A;    REVIEW OF SYSTEMS:  A comprehensive review of systems was negative except for: Constitutional: positive for fatigue Respiratory: positive for dyspnea on exertion   PHYSICAL EXAMINATION:  General appearance: alert, cooperative, fatigued and no distress Head: Normocephalic, without obvious abnormality, atraumatic Neck: no adenopathy, no JVD, supple, symmetrical, trachea midline and thyroid not enlarged, symmetric, no tenderness/mass/nodules Lymph nodes: Cervical, supraclavicular, and axillary nodes normal. Resp: clear to auscultation bilaterally Back: symmetric, no curvature. ROM normal. No CVA tenderness. Cardio: regular rate and rhythm, S1, S2 normal, no murmur, click, rub or gallop GI: soft, non-tender; bowel sounds normal; no masses,  no organomegaly Extremities: extremities normal, atraumatic, no cyanosis or edema  ECOG PERFORMANCE STATUS: 1 - Symptomatic but completely ambulatory  Blood pressure (!) 133/91, pulse (!) 108, temperature 97.9 F (36.6 C), temperature source Tympanic, resp. rate 18, height 5' 7"  (1.702 m), weight 166 lb (75.3 kg), SpO2 95 %.  LABORATORY DATA: Lab Results  Component Value Date   WBC 9.5 05/07/2020   HGB 11.7 (L) 05/07/2020   HCT 36.5 05/07/2020   MCV 95.1 05/07/2020   PLT 258 05/07/2020      Chemistry      Component Value Date/Time   NA 141 05/07/2020 1220   NA 143 01/10/2018 1629   K 4.4 05/07/2020 1220   CL 108 05/07/2020 1220   CO2 24 05/07/2020 1220   BUN 24 (H) 05/07/2020 1220   BUN 14 01/10/2018 1629   CREATININE 1.12 (H) 05/07/2020 1220   CREATININE 0.68 08/05/2015 1456      Component Value Date/Time   CALCIUM 9.0 05/07/2020 1220   ALKPHOS 87 05/07/2020 1220   AST 13 (L) 05/07/2020 1220   ALT 10 05/07/2020 1220   BILITOT 0.4 05/07/2020 1220       RADIOGRAPHIC STUDIES: CT Chest Wo Contrast  Result Date: 05/08/2020 CLINICAL DATA:  Primary Cancer Type: Lung Imaging Indication: Routine surveillance Interval therapy since last imaging? No Initial Cancer Diagnosis Date: 02/17/2018; Established by: Biopsy-proven Detailed Pathology: Stage IV non-small cell lung cancer, adenocarcinoma. Primary Tumor location: Right and  left upper lobe nodules. Surgeries: Coronary stent.  Cholecystectomy.  Hysterectomy. Chemotherapy: Yes; Ongoing? No; Most recent administration: 08/23/2018 Immunotherapy?  Yes; Type: Keytruda; Ongoing? No Radiation therapy? No EXAM: CT CHEST WITHOUT CONTRAST TECHNIQUE: Multidetector CT imaging of the chest was performed following the standard protocol without IV contrast. COMPARISON:  Most recent CT chest 10/27/2019.  02/03/2018 PET-CT. FINDINGS: Cardiovascular: The heart size is normal. No substantial pericardial effusion.  Coronary artery calcification is evident. Atherosclerotic calcification is noted in the wall of the thoracic aorta. Right Port-A-Cath tip is positioned in the mid SVC. Mediastinum/Nodes: No mediastinal lymphadenopathy. No evidence for gross hilar lymphadenopathy although assessment is limited by the lack of intravenous contrast on today's study. The esophagus has normal imaging features. There is no axillary lymphadenopathy. Lungs/Pleura: Centrilobular emphsyema noted. The superior segment left lower lobe mixed attenuation nodule is similar on image 31/5 today. Solid nodular component measured previously at 5 mm is stable at 5 mm. 6 mm index ground-glass nodule in the left upper lobe measured previously is stable at 6 mm today (38/5). Index posterior right upper lobe nodule measured previously at 16 mm is stable at 16 mm today (47/5). This lesion does have a slightly more confluent appearance on today's exam. Posterior left lower lobe ground-glass opacity measured previously at 2.6 cm is stable on image 44/5 today. Index posterior left upper lobe nodule measured previously at 1.4 cm measures 1.5 cm today on 76/5. No new suspicious pulmonary nodule or mass. No focal airspace consolidation. No pleural effusion. Upper Abdomen: Unremarkable. Musculoskeletal: No worrisome lytic or sclerotic osseous abnormality. Stable superior endplate compression deformity at T5. IMPRESSION: 1. The bilateral mixed  attenuation pulmonary nodules show no substantial interval change. No new suspicious pulmonary nodule or mass. 2. Emphysema. 3. Stable superior endplate compression deformity at T5. 4. Aortic Atherosclerosis (ICD10-I70.0) and Emphysema (ICD10-J43.9). Electronically Signed   By: Misty Stanley M.D.   On: 05/08/2020 10:17    ASSESSMENT AND PLAN: This is a very pleasant 65 years old white female recently diagnosed with a stage IV non-small cell lung cancer, adenocarcinoma with no actionable mutations and PDL 1 expression of 1% diagnosed in December 2019 and presented with bilateral pulmonary nodules. The patient was initially started on treatment with carboplatin, Alimta and Keytruda status post 4 cycles but Keytruda was discontinued after cycle #2 secondary to significant skin rash. She was treated with maintenance treatment with single agent Alimta for 3 cycles but this is currently on hold since June 2020 secondary to worsening renal insufficiency. The patient is currently on observation and she is feeling fine today with no concerning complaints. She had repeat CT scan of the chest performed recently.  I personally and independently reviewed the scans and discussed the results with the patient today. Her scan showed no concerning findings for disease progression. I recommended for her to continue on observation with repeat CT scan of the chest in 6 months. For the COPD and coronary artery disease, the patient will continue her routine follow-up visit and evaluation by pulmonary medicine and cardiology. She was advised to call immediately if she has any other concerning symptoms in the interval. The patient voices understanding of current disease status and treatment options and is in agreement with the current care plan. All questions were answered. The patient knows to call the clinic with any problems, questions or concerns. We can certainly see the patient much sooner if necessary.   Disclaimer:  This note was dictated with voice recognition software. Similar sounding words can inadvertently be transcribed and may not be corrected upon review.

## 2020-05-13 ENCOUNTER — Telehealth: Payer: Self-pay | Admitting: Internal Medicine

## 2020-05-13 NOTE — Telephone Encounter (Signed)
Scheduled per los. Called and left msg. Mailed printout  °

## 2020-06-05 ENCOUNTER — Ambulatory Visit: Payer: Managed Care, Other (non HMO) | Admitting: Cardiovascular Disease

## 2020-06-05 ENCOUNTER — Encounter: Payer: Self-pay | Admitting: *Deleted

## 2020-06-05 ENCOUNTER — Encounter: Payer: Self-pay | Admitting: Cardiovascular Disease

## 2020-06-05 ENCOUNTER — Other Ambulatory Visit: Payer: Self-pay

## 2020-06-05 VITALS — BP 136/88 | HR 61 | Ht 67.0 in | Wt 167.0 lb

## 2020-06-05 DIAGNOSIS — R002 Palpitations: Secondary | ICD-10-CM | POA: Diagnosis not present

## 2020-06-05 DIAGNOSIS — E785 Hyperlipidemia, unspecified: Secondary | ICD-10-CM | POA: Diagnosis not present

## 2020-06-05 DIAGNOSIS — I2510 Atherosclerotic heart disease of native coronary artery without angina pectoris: Secondary | ICD-10-CM | POA: Diagnosis not present

## 2020-06-05 NOTE — Patient Instructions (Signed)
Medication Instructions:  No changes *If you need a refill on your cardiac medications before your next appointment, please call your pharmacy*   Lab Work: none If you have labs (blood work) drawn today and your tests are completely normal, you will receive your results only by: Marland Kitchen MyChart Message (if you have MyChart) OR . A paper copy in the mail If you have any lab test that is abnormal or we need to change your treatment, we will call you to review the results.   Testing/Procedures: Your physician has requested that you have an echocardiogram. Echocardiography is a painless test that uses sound waves to create images of your heart. It provides your doctor with information about the size and shape of your heart and how well your heart's chambers and valves are working. This procedure takes approximately one hour. There are no restrictions for this procedure.  Your physician has requested that you have a lexiscan myoview. For further information please visit [email or web address]. Please follow instruction sheet, as given.   Follow-Up: At [facility], you and your health needs are our priority.  As part of our continuing mission to provide you with exceptional heart care, we have created designated Provider Care Teams.  These Care Teams include your primary Cardiologist (physician) and Advanced Practice Providers (APPs -  Physician Assistants and Nurse Practitioners) who all work together to provide you with the care you need, when you need it.     Your next appointment:   12 month(s)  The format for your next appointment:   In Person  Provider:   You may see Lauree Chandler, MD or one of the following Advanced Practice Providers on your designated Care Team:    Melina Copa, PA-C  Ermalinda Barrios, PA-C   Other Instructions You have been referred to [place]

## 2020-06-05 NOTE — Progress Notes (Signed)
Chief Complaint  Patient presents with  . Follow-up    CAD   History of Present Illness: 65 yo female with history of asthma, depression, GERD, fibromyalgia, small cell lung cancer, PVCs, PACs, SVT and CAD who is here today for cardiac follow up. [place] 04/2008 demonstrated 80% stenosis in the mid RCA treated with a Xience DES. Patient was noted to have possible SVT during cardiac rehabilitation 05/2008. Re-look LHC 05/2008: Proximal RCA 25%, mid RCA stent patent. Holter 03/2009: NSR, PACs and PVCs. Liver biopsy 2011 reportedly normal (steatohepatitis). Stress myoview on  11/21/12 showed a reversible basal inferior perfusion defect. She described class III angina on her f/u in our office 12/02/12. Cardiac cath 12/06/12 with severe stenosis in the mid RCA just before the old stent which also had restenosis. I treated the entire segment with a 2.75 x 28 mm Promus Premier DES. This was post-dilated to 3.0 mm. She had recurrent chest pain and was seen in the office 05/05/13. Cardiac cath 05/08/13 with severe stenosis mid LAD. A 2.5 x 16 mm Promus DES was deployed in the mid LAD. She also had c/o palpitations. She called our office in August 2015 c/o tachycardia, heart racing. Lopressor 12.5 mg po BID started. 24 hour monitor November 2015 with PACs/PVCs and 4 beat run SVT.  Last cath June 2017 with patent stents in the LAD and RCA. LVEF was normal. Shewas seen by Melina Copa, PA-C on 10/06/2016 and complained of exertional dyspnea. Subsequently she was ordered to undergo a nuclear stressto assess for ischemia. This was done08/10/2018and showed no evidence of ischemia or prior infarction. EF was noted to bemildlydecreased at 48%.Echo August 2018 showed normal left ventricular EF at 78-93%, grade1 diastolic dysfunction was noted. Wall motion was normal. No significant valvular disease was noted. She also ordered to get labs given her dyspnea. Pro BNP was normal. CBC negative for anemia. Nuclear stress test November  2019 with no ischemia. Cardiac monitor November 2019 with short runs of SVT. She has been diagnosed with small cell lung cancer in December 2019 and has undergone chemotherapy.   She is here today for follow up. The patient denies any chest pain, palpitations, lower extremity edema, orthopnea, PND, dizziness, near syncope or syncope. She ongoing dyspnea and fatigue. No weight gain.   Primary Care Physician: [name], NP  Past Medical History:  Diagnosis Date  . Anemia    "long time ago" (12/06/2012)  . Anxiety   . Arthritis    "right knee real bad; in my hands bad" (12/06/2012)  . Asthma   . Celiac disease   . Colon polyps   . COPD (chronic obstructive pulmonary disease) ([place])   . Coronary artery disease    a. s/p Xience DES to [facility] 04/2008;  b. LHC 05/2008: Proximal RCA 25%, mid RCA stent patent.  c. Anormal nuc 2014 -> s/p LHC with severe mRCA stenosis s/p DES. d. Cath 04/2013 s/p DES to LAD.  e. LHC 08/2015 was stable.  . Depression    "years ago" (12/06/2012)  . Fibromyalgia   . Gallstones   . GERD (gastroesophageal reflux disease)   . H/O hiatal hernia   . Hepatitis    "not A, B, or C" (12/06/2012)  . Hyperlipidemia    intol to statins and other chol agents due to elevated LFTs  . Hypertension   . Hypothyroidism   . IBS (irritable bowel syndrome)   . Interstitial cystitis   . Lung cancer ([place]) dx'd 01/2018  .  Lung nodules    Bilateral  . Migraines    "when I was younger" (12/06/2012)  . Pneumonia   . PONV (postoperative nausea and vomiting)    "Very bad"  . Premature atrial contractions   . PVC's (premature ventricular contractions)   . Sinus headache   . SVT (supraventricular tachycardia) ([facility])     Past Surgical History:  Procedure Laterality Date  . CARDIAC CATHETERIZATION  04/2008; 05/2008  . CARDIAC CATHETERIZATION N/A 08/07/2015   Procedure: Left Heart Cath and Coronary Angiography;  Surgeon: Sherren Mocha, MD;  Location: [place] CV LAB;  Service:  Cardiovascular;  Laterality: N/A;  . CHOLECYSTECTOMY    . CORONARY ANGIOPLASTY WITH STENT PLACEMENT  04/2008 12/06/2012   "1 + 1" (12/06/2012)  . CORONARY STENT PLACEMENT  05/08/2013   DES TO LAD      DR Stefanny Pieri  . IR IMAGING GUIDED PORT INSERTION  04/29/2018  . LEFT HEART CATHETERIZATION WITH CORONARY ANGIOGRAM N/A 12/06/2012   Procedure: LEFT HEART CATHETERIZATION WITH CORONARY ANGIOGRAM;  Surgeon: [name], MD;  Location: [facility] CATH LAB;  Service: Cardiovascular;  Laterality: N/A;  . LEFT HEART CATHETERIZATION WITH CORONARY ANGIOGRAM N/A 05/08/2013   Procedure: LEFT HEART CATHETERIZATION WITH CORONARY ANGIOGRAM;  Surgeon: [name], MD;  Location: [facility] CATH LAB;  Service: Cardiovascular;  Laterality: N/A;  . LEFT HEART CATHETERIZATION WITH CORONARY ANGIOGRAM N/A 01/04/2014   Procedure: LEFT HEART CATHETERIZATION WITH CORONARY ANGIOGRAM;  Surgeon: [name], MD;  Location: [facility] CATH LAB;  Service: Cardiovascular;  Laterality: N/A;  . PERCUTANEOUS CORONARY STENT INTERVENTION (PCI-S)  12/06/2012   Procedure: PERCUTANEOUS CORONARY STENT INTERVENTION (PCI-S);  Surgeon: [name], MD;  Location: [facility] CATH LAB;  Service: Cardiovascular;;  . PERCUTANEOUS CORONARY STENT INTERVENTION (PCI-S)  05/08/2013   Procedure: PERCUTANEOUS CORONARY STENT INTERVENTION (PCI-S);  Surgeon: [name], MD;  Location: [facility] CATH LAB;  Service: Cardiovascular;;  mid LAD   . TUBAL LIGATION    . VAGINAL HYSTERECTOMY    . VIDEO BRONCHOSCOPY WITH ENDOBRONCHIAL NAVIGATION N/A 02/17/2018   Procedure: VIDEO BRONCHOSCOPY WITH ENDOBRONCHIAL NAVIGATION;  Surgeon: Melrose Nakayama, MD;  Location: [place];  Service: Thoracic;  Laterality: N/A;    Current Outpatient Medications  Medication Sig Dispense Refill  . albuterol (PROVENTIL) (5 MG/ML) 0.5% nebulizer solution Take 2.5 mg by nebulization every 6 (six) hours as needed for wheezing.    Marland Kitchen albuterol (VENTOLIN HFA) 108 (90 Base)  MCG/ACT inhaler Inhale 1 puff into the lungs every 6 (six) hours as needed (wheezing/shortness of breath.).     Marland Kitchen ALPRAZolam (XANAX) 0.5 MG tablet for sedation before MRI scan; take 1 tab 1 hour before scan; may repeat 1 tab 15 min before scan 3 tablet 0  . aspirin 81 MG chewable tablet Chew 1 tablet (81 mg total) by mouth daily.    . BELSOMRA 20 MG TABS Take 20 mg by mouth at bedtime as needed for sleep.    Marland Kitchen buPROPion (WELLBUTRIN SR) 150 MG 12 hr tablet Take 150 mg by mouth daily.     . DULoxetine (CYMBALTA) 60 MG capsule Take 120 mg by mouth daily.    Marland Kitchen HYDROcodone-acetaminophen (NORCO) 7.5-325 MG per tablet Take 1 tablet by mouth every 6 (six) hours as needed for pain.    Marland Kitchen ipratropium-albuterol (DUONEB) 0.5-2.5 (3) MG/3ML SOLN Inhale 3 mLs into the lungs every 6 (six) hours as needed (Asthma).     . loperamide (IMODIUM) 2 MG capsule Take 1 capsule (2 mg  total) by mouth as needed for diarrhea or loose stools (Initiate if stool C diff -ve). 30 capsule 0  . LORazepam (ATIVAN) 2 MG tablet Take 2 mg by mouth every evening.    . metoprolol tartrate (LOPRESSOR) 25 MG tablet TAKE 1/2 TABLET(12.5 MG) BY MOUTH TWICE DAILY 90 tablet 0  . montelukast (SINGULAIR) 10 MG tablet Take 10 mg by mouth at bedtime.    . nitroGLYCERIN (NITROSTAT) 0.4 MG SL tablet Place 1 tablet (0.4 mg total) under the tongue every 5 (five) minutes as needed for chest pain. 25 tablet 12  . oxybutynin (DITROPAN-XL) 5 MG 24 hr tablet Take 5 mg by mouth daily.    . SYMBICORT 160-4.5 MCG/ACT inhaler Take 2 puffs by mouth 2 (two) times daily.  3  . Vitamin D, Ergocalciferol, (DRISDOL) 1.25 MG (50000 UT) CAPS capsule Take 50,000 Units by mouth every 7 (seven) days. Wednesdays     No current facility-administered medications for this visit.    Allergies  Allergen Reactions  . Ciprofloxacin Hives  . Levofloxacin     UNSPECIFIED REACTION, BUT LIKELY HIVES > HIVES WITH CIPRO  . Statins Other (See Comments)    Liver enzymes Liver  enzymes  . Tricor [Fenofibrate] Hives and Itching  . Compazine [Prochlorperazine]     Hx of tardive dyskinesia with compazine  . Latex Other (See Comments)    Patient states "Messes with my skin."  . Codeine Nausea And Vomiting  . Metoprolol Other (See Comments)    Does not tolerate beta blockers 10/02/2013-pt states she can tolerate 25 mg and lower     Social History   Socioeconomic History  . Marital status: Married    Spouse name: Laverna Peace  . Number of children: 4  . Years of education: Not on file  . Highest education level: Bachelor's degree (e.g., BA, AB, BS)  Occupational History    Comment: owned a day care  Tobacco Use  . Smoking status: Former Smoker    Packs/day: 0.50    Years: 30.00    Pack years: 15.00    Types: Cigarettes    Quit date: 2009    Years since quitting: 13.2  . Smokeless tobacco: Never Used  . Tobacco comment: 12/06/2012 "quit smoking cigarettes ~ 8 yr ago"  Vaping Use  . Vaping Use: Never used  Substance and Sexual Activity  . Alcohol use: No  . Drug use: No  . Sexual activity: Yes  Other Topics Concern  . Not on file  Social History Narrative   Lives with husband   Social Determinants of Health   Financial Resource Strain: Not on file  Food Insecurity: Not on file  Transportation Needs: Not on file  Physical Activity: Not on file  Stress: Not on file  Social Connections: Not on file  Intimate Partner Violence: Not on file   Family History: No CAD  Review of Systems:  As stated in the HPI and otherwise negative.   BP 136/88   Pulse 61   Ht 5' 7"  (1.702 m)   Wt 167 lb (75.8 kg)   SpO2 96%   BMI 26.16 kg/m   Physical Examination: General: Well developed, well nourished, NAD  HEENT: OP clear, mucus membranes moist  SKIN: warm, dry. No rashes. Neuro: No focal deficits  Musculoskeletal: Muscle strength 5/5 all ext  Psychiatric: Mood and affect normal  Neck: No JVD, no carotid bruits, no thyromegaly, no lymphadenopathy.   Lungs:Clear bilaterally, no wheezes, rhonci, crackles Cardiovascular: Regular rate and  rhythm. No murmurs, gallops or rubs. Abdomen:Soft. Bowel sounds present. Non-tender.  Extremities: No lower extremity edema. Pulses are 2 + in the bilateral DP/PT.  EKG:  EKG is not  ordered today. The ekg ordered today demonstrates   Recent Labs: 12/18/2019: TSH 4.740 05/07/2020: ALT 10; BUN 24; Creatinine 1.12; Hemoglobin 11.7; Platelet Count 258; Potassium 4.4; Sodium 141   Lipid Panel     Component Value Date/Time   CHOL 284 (H) 02/01/2018 1539   TRIG 248 (H) 02/01/2018 1539   HDL 51 02/01/2018 1539   CHOLHDL 5.6 (H) 02/01/2018 1539   CHOLHDL 4.2 08/22/2015 1408   VLDL 37 (H) 08/22/2015 1408   LDLCALC 183 (H) 02/01/2018 1539   LDLDIRECT 198 (H) 02/01/2018 1539   LDLDIRECT 186 (H) 08/22/2015 1408   LABVLDL 50 (H) 02/01/2018 1539     Wt Readings from Last 3 Encounters:  06/05/20 167 lb (75.8 kg)  05/08/20 166 lb (75.3 kg)  12/18/19 160 lb (72.6 kg)     Other studies Reviewed: Additional studies/ records that were reviewed today include: . Review of the above records demonstrates:    Assessment and Plan:   1. CAD without angina: Last cath in 2017 with patent LAD and RCA stents. Nuclear stress test in November 2019 with no ischemia. No chest pain. She does have ongoing dyspnea. Will arrange a Lexiscan nuclear stress test to exclude ischemia. Will arrange an echocardiogram to assess LV function and exclude pericardial effusion. Will continue ASA and beta blocker. She does not tolerate statins.  2. Palpitations/PAC/PVC/sinus tachycardia: Rare palpitations. Continue beta blocker.   3. Hyperlipidemia: She does not tolerate statins due to muscle pains/weakness and abnormal LFTs. She was seen in the lipid clinic and Repatha/Praluent discussed but she now has advanced lung cancer and she does not wish to pursue this. Her cancer is quiet currently so will refer back to the lipid clinic.   4.  Lung cancer. She has completed chemotherapy. No progression of disease based on recent findings in the Oncology office.   Current medicines are reviewed at length with the patient today.  The patient does not have concerns regarding medicines.  The following changes have been made:  no change  Labs/ tests ordered today include:   Orders Placed This Encounter  Procedures  . AMB Referral to [facility]  . MYOCARDIAL PERFUSION IMAGING  . ECHOCARDIOGRAM COMPLETE    Disposition:   FU with me in 12  months  Signed, Lauree Chandler, MD 06/05/2020 4:29 PM    [facility] [place] [address], [place], [place]  [address] Phone: [phone]; Fax: [phone]

## 2020-06-25 ENCOUNTER — Other Ambulatory Visit: Payer: Self-pay | Admitting: Cardiovascular Disease

## 2020-06-27 ENCOUNTER — Telehealth: Payer: Self-pay | Admitting: Internal Medicine

## 2020-06-27 NOTE — Telephone Encounter (Signed)
Cancelled appt per 4/28 sch msg. Called pt, no answer. Left msg for pt to call back to r/s.

## 2020-07-04 ENCOUNTER — Telehealth: Payer: Self-pay | Admitting: Internal Medicine

## 2020-07-04 NOTE — Telephone Encounter (Signed)
Pt called and left vm to r/s appt from 4/28. Called pt back, no answer. Left msg for pt to call back to r/s.

## 2020-07-08 ENCOUNTER — Telehealth: Payer: Self-pay | Admitting: Internal Medicine

## 2020-07-08 NOTE — Telephone Encounter (Signed)
Returned pt's vm from 5/6 to r/s port flush. Pt is aware of appt date and time.

## 2020-07-09 ENCOUNTER — Telehealth (HOSPITAL_COMMUNITY): Payer: Self-pay | Admitting: *Deleted

## 2020-07-09 NOTE — Telephone Encounter (Signed)
Patient given detailed instructions per Myocardial Perfusion Study Information Sheet for the test on 07/12/20 at 10:15. Patient notified to arrive 15 minutes early and that it is imperative to arrive on time for appointment to keep from having the test rescheduled.  If you need to cancel or reschedule your appointment, please call the office within 24 hours of your appointment. . Patient verbalized understanding.[name]

## 2020-07-12 ENCOUNTER — Inpatient Hospital Stay: Payer: Managed Care, Other (non HMO) | Attending: Internal Medicine

## 2020-07-12 ENCOUNTER — Other Ambulatory Visit: Payer: Self-pay

## 2020-07-12 ENCOUNTER — Ambulatory Visit (INDEPENDENT_AMBULATORY_CARE_PROVIDER_SITE_OTHER): Payer: Managed Care, Other (non HMO) | Admitting: Pharmacist

## 2020-07-12 ENCOUNTER — Ambulatory Visit (HOSPITAL_BASED_OUTPATIENT_CLINIC_OR_DEPARTMENT_OTHER): Payer: Managed Care, Other (non HMO)

## 2020-07-12 DIAGNOSIS — E785 Hyperlipidemia, unspecified: Secondary | ICD-10-CM | POA: Insufficient documentation

## 2020-07-12 DIAGNOSIS — Z452 Encounter for adjustment and management of vascular access device: Secondary | ICD-10-CM | POA: Insufficient documentation

## 2020-07-12 DIAGNOSIS — I2510 Atherosclerotic heart disease of native coronary artery without angina pectoris: Secondary | ICD-10-CM | POA: Insufficient documentation

## 2020-07-12 DIAGNOSIS — R002 Palpitations: Secondary | ICD-10-CM

## 2020-07-12 DIAGNOSIS — Z85118 Personal history of other malignant neoplasm of bronchus and lung: Secondary | ICD-10-CM | POA: Diagnosis present

## 2020-07-12 DIAGNOSIS — Z95828 Presence of other vascular implants and grafts: Secondary | ICD-10-CM

## 2020-07-12 DIAGNOSIS — C3492 Malignant neoplasm of unspecified part of left bronchus or lung: Secondary | ICD-10-CM

## 2020-07-12 LAB — MYOCARDIAL PERFUSION IMAGING
LV dias vol: 39 mL (ref 46–106)
LV sys vol: 17 mL
Peak HR: 114 {beats}/min
Rest HR: 100 {beats}/min
SDS: 3
SRS: 0
SSS: 3
TID: 1.09

## 2020-07-12 LAB — ECHOCARDIOGRAM COMPLETE
Area-P 1/2: 4.57 cm2
S' Lateral: 2.4 cm

## 2020-07-12 MED ORDER — SODIUM CHLORIDE 0.9% FLUSH
10.0000 mL | Freq: Once | INTRAVENOUS | Status: AC
  Administered 2020-07-12: 10 mL
  Filled 2020-07-12: qty 10

## 2020-07-12 MED ORDER — HEPARIN SOD (PORK) LOCK FLUSH 100 UNIT/ML IV SOLN
500.0000 [IU] | Freq: Once | INTRAVENOUS | Status: AC
  Administered 2020-07-12: 500 [IU]
  Filled 2020-07-12: qty 5

## 2020-07-12 MED ORDER — TECHNETIUM TC 99M TETROFOSMIN IV KIT
10.9000 | PACK | Freq: Once | INTRAVENOUS | Status: AC | PRN
Start: 2020-07-12 — End: 2020-07-12
  Administered 2020-07-12: 10.9 via INTRAVENOUS
  Filled 2020-07-12: qty 11

## 2020-07-12 MED ORDER — TECHNETIUM TC 99M TETROFOSMIN IV KIT
32.6000 | PACK | Freq: Once | INTRAVENOUS | Status: AC | PRN
Start: 2020-07-12 — End: 2020-07-12
  Administered 2020-07-12: 32.6 via INTRAVENOUS
  Filled 2020-07-12: qty 33

## 2020-07-12 MED ORDER — REGADENOSON 0.4 MG/5ML IV SOLN
0.4000 mg | Freq: Once | INTRAVENOUS | Status: AC
  Administered 2020-07-12: 0.4 mg via INTRAVENOUS

## 2020-07-12 NOTE — Progress Notes (Signed)
Patient ID: [name]                 DOB: 09-02-1955                    MRN: [id number]     HPI: [name] is a 65 y.o. female patient referred to lipid clinic by [facility]. PMH is significant for asthma, depression, GERD, fibromyalgia, small cell lung cancer, PVCs, PACs, SVT, steatohepatitis, progressive CAD and small cell lung cancer. She does not tolerate statins. Was seen in the lipid clinic previously, but did not wish to pursue PCSK9i due to her advanced lung cancer. Her cancer is quite now so she was referred back to lipid clinic.  Patient presents today to lipid clinic. She states that she is not interested in medication that might have a side effect. She was on treatment for her lung cancer, but he kidneys failed and now she is not receiving any treatment. She has stage V lung cancer. She does not think she will be alive in 1 year. She eats what she wants when she wants to eat. Wasn't able to eat anything but apple sauce for awhile.  Patient also having a stress test today due to SOB.  Current Medications: none Intolerances: : atorvastatin 41m daily, Crestor, simvastatin, pravastatin - myalgias and LFT elevations. Fenofibrate - hives and itching. Zetia - myalgias. Risk Factors: CAD s/p stenting of RCA and LAD, unstable angina LDL goal: <55  Diet: yogurt, apple sauce, microwave dinners Dinner: [employer], [employer]- vegetables, hamburgers  Exercise: on O2, walks with walker  Family History:  Family History  Problem Relation Age of Onset  . Colon cancer Father 465 . Lung disease Father   . Emphysema Mother   . Drug abuse Brother   . Hepatitis C Brother   . Drug abuse Brother   . Celiac disease Son   . Celiac disease Grandson   . Down syndrome Grandson   . Diabetes Granddaughter   . CAD Neg Hx      Social History: No ETOH, former smoker Quite 8-9 years ago, no illicit drugs  Labs: 184/5/3646TC 284, HDL 51, LDL 198, TG 248  Past Medical History:  Diagnosis Date  .  Anemia    "long time ago" (12/06/2012)  . Anxiety   . Arthritis    "right knee real bad; in my hands bad" (12/06/2012)  . Asthma   . Celiac disease   . Colon polyps   . COPD (chronic obstructive pulmonary disease) ([place]   . Coronary artery disease    [facility];  b. LHC 05/2008: Proximal RCA 25%, mid RCA stent patent.  c. Anormal nuc 2014 -> s/p LHC with severe mRCA stenosis s/p DES. d. Cath 04/2013 s/p DES to LAD.  e. LHC 08/2015 was stable.  . Depression    "years ago" (12/06/2012)  . Fibromyalgia   . Gallstones   . GERD (gastroesophageal reflux disease)   . H/O hiatal hernia   . Hepatitis    "not A, B, or C" (12/06/2012)  . Hyperlipidemia    intol to statins and other chol agents due to elevated LFTs  . Hypertension   . Hypothyroidism   . IBS (irritable bowel syndrome)   . Interstitial cystitis   . Lung cancer [place] dx'd 01/2018  . Lung nodules    Bilateral  . Migraines    "when I was younger" (12/06/2012)  . Pneumonia   .  PONV (postoperative nausea and vomiting)    "Very bad"  . Premature atrial contractions   . PVC's (premature ventricular contractions)   . Sinus headache   . SVT (supraventricular tachycardia) ([place])     Current Outpatient Medications on File Prior to Visit  Medication Sig Dispense Refill  . albuterol (PROVENTIL) (5 MG/ML) 0.5% nebulizer solution Take 2.5 mg by nebulization every 6 (six) hours as needed for wheezing.    Marland Kitchen albuterol (VENTOLIN HFA) 108 (90 Base) MCG/ACT inhaler Inhale 1 puff into the lungs every 6 (six) hours as needed (wheezing/shortness of breath.).     Marland Kitchen ALPRAZolam (XANAX) 0.5 MG tablet for sedation before MRI scan; take 1 tab 1 hour before scan; may repeat 1 tab 15 min before scan 3 tablet 0  . aspirin 81 MG chewable tablet Chew 1 tablet (81 mg total) by mouth daily.    . BELSOMRA 20 MG TABS Take 20 mg by mouth at bedtime as needed for sleep.    Marland Kitchen buPROPion (WELLBUTRIN SR) 150 MG 12 hr tablet Take 150 mg by mouth  daily.     . DULoxetine (CYMBALTA) 60 MG capsule Take 120 mg by mouth daily.    Marland Kitchen HYDROcodone-acetaminophen (NORCO) 7.5-325 MG per tablet Take 1 tablet by mouth every 6 (six) hours as needed for pain.    Marland Kitchen ipratropium-albuterol (DUONEB) 0.5-2.5 (3) MG/3ML SOLN Inhale 3 mLs into the lungs every 6 (six) hours as needed (Asthma).     . loperamide (IMODIUM) 2 MG capsule Take 1 capsule (2 mg total) by mouth as needed for diarrhea or loose stools (Initiate if stool C diff -ve). 30 capsule 0  . LORazepam (ATIVAN) 2 MG tablet Take 2 mg by mouth every evening.    . metoprolol tartrate (LOPRESSOR) 25 MG tablet TAKE 1/2 TABLET(12.5 MG) BY MOUTH TWICE DAILY 90 tablet 2  . montelukast (SINGULAIR) 10 MG tablet Take 10 mg by mouth at bedtime.    . nitroGLYCERIN (NITROSTAT) 0.4 MG SL tablet Place 1 tablet (0.4 mg total) under the tongue every 5 (five) minutes as needed for chest pain. 25 tablet 12  . oxybutynin (DITROPAN-XL) 5 MG 24 hr tablet Take 5 mg by mouth daily.    . SYMBICORT 160-4.5 MCG/ACT inhaler Take 2 puffs by mouth 2 (two) times daily.  3  . Vitamin D, Ergocalciferol, (DRISDOL) 1.25 MG (50000 UT) CAPS capsule Take 50,000 Units by mouth every 7 (seven) days. Wednesdays     No current facility-administered medications on file prior to visit.    Allergies  Allergen Reactions  . Ciprofloxacin Hives  . Levofloxacin     UNSPECIFIED REACTION, BUT LIKELY HIVES > HIVES WITH CIPRO  . Statins Other (See Comments)    Liver enzymes Liver enzymes  . Tricor [Fenofibrate] Hives and Itching  . Compazine [Prochlorperazine]     Hx of tardive dyskinesia with compazine  . Latex Other (See Comments)    Patient states "Messes with my skin."  . Codeine Nausea And Vomiting  . Metoprolol Other (See Comments)    Does not tolerate beta blockers 10/02/2013-pt states she can tolerate 25 mg and lower     Assessment/Plan:  1. Hyperlipidemia - Patients LDL is above goal of <55. Patient does not want to take any  medications that might cause side effects. We did discuss that although risk of side effects is low, no drug is free of all side effects. Given her lung cancer, I respect the patients choice to decline medication for secondary  prevention. Her oncologist has advised her to eat what she wants when she has an appetite to eat, therefore no strong nutrition advice given.   Thank you,   Ramond Dial, Pharm.D, BCPS, CPP [place]  [address] [place], [place] [address]  Phone: [phone]; Fax: [phone]

## 2020-07-15 ENCOUNTER — Ambulatory Visit (INDEPENDENT_AMBULATORY_CARE_PROVIDER_SITE_OTHER): Payer: Managed Care, Other (non HMO)

## 2020-07-15 ENCOUNTER — Telehealth: Payer: Self-pay | Admitting: *Deleted

## 2020-07-15 DIAGNOSIS — I3139 Other pericardial effusion (noninflammatory): Secondary | ICD-10-CM

## 2020-07-15 DIAGNOSIS — I471 Supraventricular tachycardia: Secondary | ICD-10-CM

## 2020-07-15 DIAGNOSIS — I313 Pericardial effusion (noninflammatory): Secondary | ICD-10-CM

## 2020-07-15 NOTE — Telephone Encounter (Signed)
She has a history of SVT. We can arrange a 14 day Zio to evaluate. Thanks, chris

## 2020-07-15 NOTE — Telephone Encounter (Signed)
Monitor ordered.  Pt aware.

## 2020-07-15 NOTE — Telephone Encounter (Addendum)
-----   Message from [name], MD sent at 07/15/2020  8:19 AM EDT ----- Her heart is strong. Her valves are ok. She does have a little fluid around her heart. She will need a limited echo repeated in 4-6 weeks to make sure the pericardial effusion has not changed. Also see stress test. Gerald Stabs ____________________________________________________________________________ Pt informed of myoview and echo results.   Pt has been scheduled for limited echo fo 6/15.  While on the phone she mentions her HR on Saturday (via Pox) was 160 all day.  She had done nothing out of the ordinary.  She took her metoprolol early that day.  It has been high most days recently so she takes the whole tablet 25 mg BID now.  Today on the phone HR is 110.  She is only using albuterol occas depending on asthma symptoms.  She notices that her heart "flip flops" and states it has done this for years.    Pt aware I will let Dr. Angelena Form know and we will call her if new recommendations.

## 2020-08-03 DIAGNOSIS — I471 Supraventricular tachycardia: Secondary | ICD-10-CM

## 2020-08-14 ENCOUNTER — Other Ambulatory Visit: Payer: Self-pay

## 2020-08-14 ENCOUNTER — Ambulatory Visit (HOSPITAL_COMMUNITY): Payer: Managed Care, Other (non HMO) | Attending: Internal Medicine

## 2020-08-14 DIAGNOSIS — I313 Pericardial effusion (noninflammatory): Secondary | ICD-10-CM | POA: Diagnosis not present

## 2020-08-14 DIAGNOSIS — I3139 Other pericardial effusion (noninflammatory): Secondary | ICD-10-CM

## 2020-08-14 LAB — ECHOCARDIOGRAM LIMITED
Area-P 1/2: 3.2 cm2
S' Lateral: 2.9 cm

## 2020-08-15 ENCOUNTER — Telehealth: Payer: Self-pay | Admitting: *Deleted

## 2020-08-15 DIAGNOSIS — I3139 Other pericardial effusion (noninflammatory): Secondary | ICD-10-CM

## 2020-08-15 NOTE — Telephone Encounter (Signed)
-----   Message from [name], MD sent at 08/14/2020  3:38 PM EDT ----- Possible fat pad or small effusion. As suggested by DR. Hilty, can we arrange a cardiac MRI to better assess. [name]

## 2020-08-15 NOTE — Telephone Encounter (Signed)
Order placed for cardiac MRI.  Pt aware and will await call from scheduling.

## 2020-08-26 ENCOUNTER — Telehealth: Payer: Self-pay | Admitting: Internal Medicine

## 2020-08-26 NOTE — Telephone Encounter (Signed)
Cancelled appt per 6/27 sch msg. Called pt, no answer. Left msg letting pt know I cancelled appt and to call back to r/s.

## 2020-09-13 ENCOUNTER — Telehealth (HOSPITAL_COMMUNITY): Payer: Self-pay | Admitting: Emergency Medicine

## 2020-09-13 ENCOUNTER — Telehealth: Payer: Self-pay | Admitting: Cardiovascular Disease

## 2020-09-13 MED ORDER — ALPRAZOLAM 0.5 MG PO TABS: ORAL_TABLET | ORAL | 0 refills | Status: DC

## 2020-09-13 NOTE — Telephone Encounter (Signed)
Attempted to call patient regarding upcoming cardiac MR appointment. Left message on voicemail with name and callback number Lavonn Maxcy RN Navigator Cardiac Imaging [facility] [phone] Office [phone] Cell  

## 2020-09-13 NOTE — Telephone Encounter (Signed)
pt states that she has extreme Claustrophobia and normally when she has her mri she is given valium and would like to go ahead and call her rx in so that she is able to have it for her appt... please advise

## 2020-09-13 NOTE — Telephone Encounter (Signed)
Spoke w patient.  She meant to ask for Alprazolam, not valium  she has taken 0.5 mg previously for MRi.    [facility] on [address].  Pt aware she will need a driver.

## 2020-09-13 NOTE — Telephone Encounter (Signed)
Pt requesting valium for cardiac MRI on 09/16/20.

## 2020-09-13 NOTE — Addendum Note (Signed)
Addended by: Lauree Chandler D on: 09/13/2020 02:48 PM   Modules accepted: Orders

## 2020-09-16 ENCOUNTER — Ambulatory Visit (HOSPITAL_COMMUNITY)
Admission: RE | Admit: 2020-09-16 | Discharge: 2020-09-16 | Disposition: A | Discharge status: Home / Self Care | Payer: Managed Care, Other (non HMO) | Source: Ambulatory Visit | Attending: Cardiovascular Disease | Admitting: Cardiovascular Disease

## 2020-09-16 ENCOUNTER — Other Ambulatory Visit: Payer: Self-pay

## 2020-09-16 DIAGNOSIS — I3139 Other pericardial effusion (noninflammatory): Secondary | ICD-10-CM

## 2020-09-16 DIAGNOSIS — I313 Pericardial effusion (noninflammatory): Secondary | ICD-10-CM | POA: Diagnosis present

## 2020-09-16 MED ORDER — GADOBUTROL 1 MMOL/ML IV SOLN
10.0000 mL | Freq: Once | INTRAVENOUS | Status: AC | PRN
  Administered 2020-09-16: 10 mL via INTRAVENOUS

## 2020-09-20 ENCOUNTER — Telehealth: Payer: Self-pay | Admitting: Cardiovascular Disease

## 2020-09-20 NOTE — Telephone Encounter (Signed)
See other phone note ./cy

## 2020-09-20 NOTE — Telephone Encounter (Signed)
Patient called and wanted to know about the results of her Cardiac MRI that was done Monday 09/16/20

## 2020-09-20 NOTE — Telephone Encounter (Signed)
Pt called wanting MRI results Appears study has not been read at this time Will forward to Dr Angelena Form to review the patient noted being awakened  with SOB and this has never happened before Per pt lung MD feels this is  heart related .Instructed pt if S/S worsen to go to ED for eval and tx./cy

## 2020-09-20 NOTE — Telephone Encounter (Signed)
Pt c/o Shortness Of Breath: STAT if SOB developed within the last 24 hours or pt is noticeably SOB on the phone  1. Are you currently SOB (can you hear that pt is SOB on the phone)? Yes  2. How long have you been experiencing SOB? ~ 6 months. Patient has lung cancer but has had to be on oxygen 24/7 for the past 2 months.   3. Are you SOB when sitting or when up moving around? All the time  4. Are you currently experiencing any other symptoms?  Patient states her SOB woke her up 2x last night. This is the first time she has been woken up

## 2020-09-23 NOTE — Telephone Encounter (Signed)
Patient informed of results and is pleased to hear.  She has follow up with her oncologist 11/12/20. She said they thought her worsening symptoms were from her heart.    She has felt a little better since yesterday for the first time in a while.  Scheduled her 28mov with Dr. MAngelena Form  She will call if any sooner needs arise.

## 2020-10-19 IMAGING — XA IR FLUORO GUIDE CV LINE*L*
1 series · 1 of 1 positions shown · non-contrast
Comparison: none

INDICATION: 62-year-old female with stage IV left lung adenocarcinoma. She
presents for durable venous access for chemotherapy.

[Series 300: line placements · 1 of 1 slices shown]
[im 1/1]
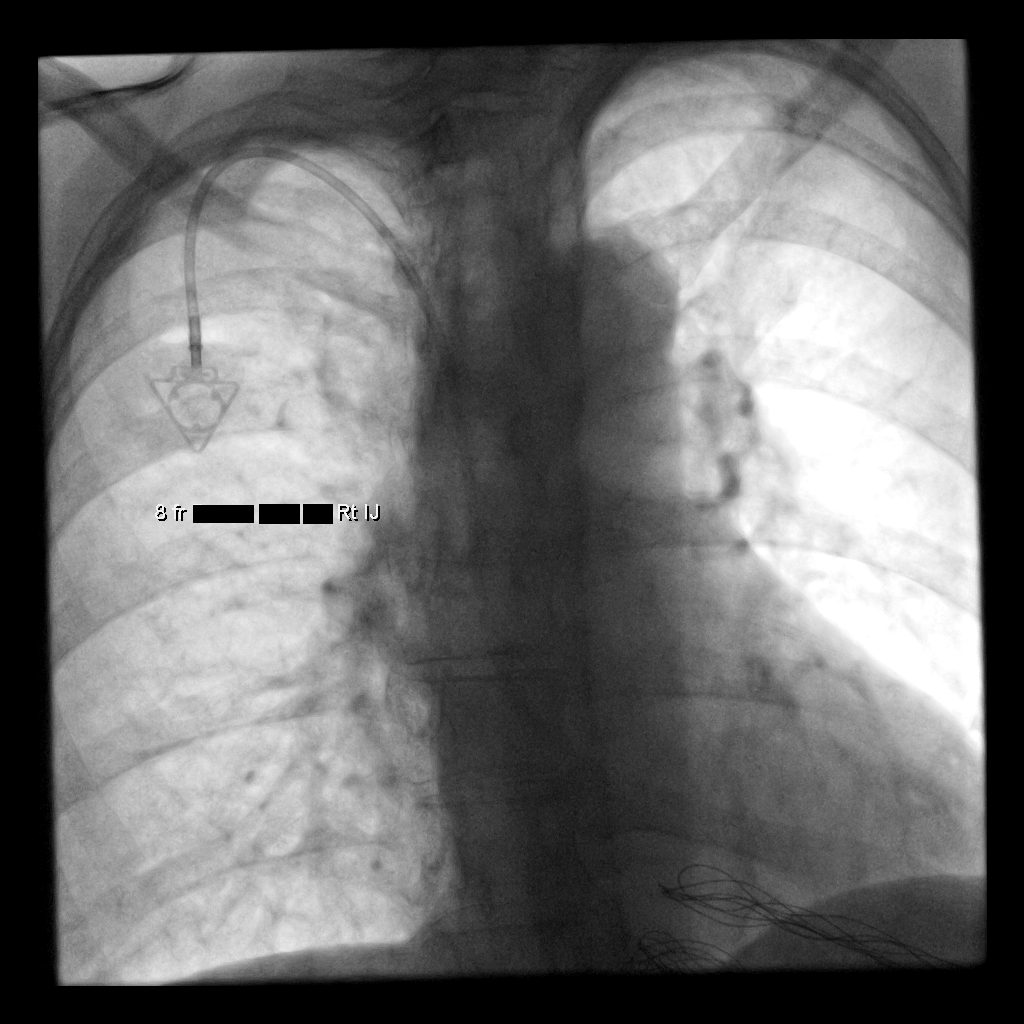

[1 of 1 positions shown; findings below may reference images not displayed]

EXAM:
IMPLANTED PORT A CATH PLACEMENT WITH ULTRASOUND AND FLUOROSCOPIC
GUIDANCE

MEDICATIONS:
2 g Ancef; The antibiotic was administered within an appropriate
time interval prior to skin puncture.

ANESTHESIA/SEDATION:
Versed 4 mg IV; Fentanyl 100 mcg IV;

Moderate Sedation Time:  20 minutes

The patient was continuously monitored during the procedure by the
interventional radiology nurse under my direct supervision.

FLUOROSCOPY TIME:  0 minutes, 18 seconds (4 mGy)

COMPLICATIONS:
None immediate.

PROCEDURE:
The right neck and chest was prepped with chlorhexidine, and draped
in the usual sterile fashion using maximum barrier technique (cap
and mask, sterile gown, sterile gloves, large sterile sheet, hand
hygiene and cutaneous antiseptic). Local anesthesia was attained by
infiltration with 1% lidocaine with epinephrine.

Ultrasound demonstrated patency of the right internal jugular vein,
and this was documented with an image. Under real-time ultrasound
guidance, this vein was accessed with a 21 gauge micropuncture
needle and image documentation was performed. A small dermatotomy
was made at the access site with an 11 scalpel. A 0.018" wire was
advanced into the SVC and the access needle exchanged for a 4F
micropuncture vascular sheath. The 0.018" wire was then removed and
a 0.035" wire advanced into the IVC.



The venous access site was then serially dilated and a peel away
vascular sheath placed over the wire. The wire was removed and the
port catheter advanced into position under fluoroscopic guidance.
The catheter tip is positioned in the superior cavoatrial junction.
This was documented with a spot image. The portacatheter was then
tested and found to flush and aspirate well. The port was flushed
with saline followed by 100 units/mL heparinized saline.

The pocket was then closed in two layers using first subdermal
inverted interrupted absorbable sutures followed by a running
subcuticular suture. The epidermis was then sealed with Dermabond.
The dermatotomy at the venous access site was also closed with
Dermabond.
IMPRESSION: Successful placement of a right IJ approach Power Port with
ultrasound and fluoroscopic guidance. The catheter is ready for use.

## 2020-11-08 ENCOUNTER — Encounter (HOSPITAL_COMMUNITY): Payer: Self-pay

## 2020-11-08 ENCOUNTER — Ambulatory Visit (HOSPITAL_COMMUNITY)
Admission: RE | Admit: 2020-11-08 | Discharge: 2020-11-08 | Disposition: A | Discharge status: Home / Self Care | Payer: Managed Care, Other (non HMO) | Source: Ambulatory Visit | Attending: Internal Medicine | Admitting: Internal Medicine

## 2020-11-08 ENCOUNTER — Inpatient Hospital Stay: Payer: Managed Care, Other (non HMO)

## 2020-11-08 ENCOUNTER — Inpatient Hospital Stay: Payer: Managed Care, Other (non HMO) | Attending: Internal Medicine

## 2020-11-08 ENCOUNTER — Other Ambulatory Visit: Payer: Self-pay

## 2020-11-08 DIAGNOSIS — C3492 Malignant neoplasm of unspecified part of left bronchus or lung: Secondary | ICD-10-CM

## 2020-11-08 DIAGNOSIS — C3490 Malignant neoplasm of unspecified part of unspecified bronchus or lung: Secondary | ICD-10-CM

## 2020-11-08 DIAGNOSIS — Z95828 Presence of other vascular implants and grafts: Secondary | ICD-10-CM

## 2020-11-08 LAB — CMP (CANCER CENTER ONLY)
ALT: 26 U/L (ref 0–44)
AST: 19 U/L (ref 15–41)
Albumin: 3.8 g/dL (ref 3.5–5.0)
Alkaline Phosphatase: 95 U/L (ref 38–126)
Anion gap: 9 (ref 5–15)
BUN: 20 mg/dL (ref 8–23)
CO2: 24 mmol/L (ref 22–32)
Calcium: 9.3 mg/dL (ref 8.9–10.3)
Chloride: 110 mmol/L (ref 98–111)
Creatinine: 1.19 mg/dL — ABNORMAL HIGH (ref 0.44–1.00)
GFR, Estimated: 50 mL/min — ABNORMAL LOW (ref >60)
Glucose, Bld: 102 mg/dL — ABNORMAL HIGH (ref 70–99)
Potassium: 4.5 mmol/L (ref 3.5–5.1)
Sodium: 143 mmol/L (ref 135–145)
Total Bilirubin: 0.4 mg/dL (ref 0.3–1.2)
Total Protein: 6.7 g/dL (ref 6.5–8.1)

## 2020-11-08 LAB — CBC WITH DIFFERENTIAL (CANCER CENTER ONLY)
Abs Immature Granulocytes: 0.02 10*3/uL (ref 0.00–0.07)
Basophils Absolute: 0.1 10*3/uL (ref 0.0–0.1)
Basophils Relative: 1 %
Eosinophils Absolute: 0.2 10*3/uL (ref 0.0–0.5)
Eosinophils Relative: 2 %
HCT: 35 % — ABNORMAL LOW (ref 36.0–46.0)
Hemoglobin: 11.7 g/dL — ABNORMAL LOW (ref 12.0–15.0)
Immature Granulocytes: 0 %
Lymphocytes Relative: 27 %
Lymphs Abs: 1.8 10*3/uL (ref 0.7–4.0)
MCH: 31 pg (ref 26.0–34.0)
MCHC: 33.4 g/dL (ref 30.0–36.0)
MCV: 92.8 fL (ref 80.0–100.0)
Monocytes Absolute: 0.6 10*3/uL (ref 0.1–1.0)
Monocytes Relative: 9 %
Neutro Abs: 4.1 10*3/uL (ref 1.7–7.7)
Neutrophils Relative %: 61 %
Platelet Count: 190 10*3/uL (ref 150–400)
RBC: 3.77 MIL/uL — ABNORMAL LOW (ref 3.87–5.11)
RDW: 13.2 % (ref 11.5–15.5)
WBC Count: 6.8 10*3/uL (ref 4.0–10.5)
nRBC: 0 % (ref 0.0–0.2)

## 2020-11-08 MED ORDER — HEPARIN SOD (PORK) LOCK FLUSH 100 UNIT/ML IV SOLN
INTRAVENOUS | Status: AC
  Filled 2020-11-08: qty 5

## 2020-11-08 MED ORDER — SODIUM CHLORIDE 0.9% FLUSH
10.0000 mL | Freq: Once | INTRAVENOUS | Status: AC
  Administered 2020-11-08: 10 mL

## 2020-11-08 MED ORDER — HEPARIN SOD (PORK) LOCK FLUSH 100 UNIT/ML IV SOLN
500.0000 [IU] | Freq: Once | INTRAVENOUS | Status: AC
  Administered 2020-11-08: 500 [IU] via INTRAVENOUS

## 2020-11-08 MED ORDER — IOHEXOL 350 MG/ML SOLN
80.0000 mL | Freq: Once | INTRAVENOUS | Status: AC | PRN
  Administered 2020-11-08: 80 mL via INTRAVENOUS

## 2020-11-12 ENCOUNTER — Ambulatory Visit: Payer: Managed Care, Other (non HMO) | Admitting: Internal Medicine

## 2020-11-18 ENCOUNTER — Inpatient Hospital Stay (HOSPITAL_BASED_OUTPATIENT_CLINIC_OR_DEPARTMENT_OTHER): Payer: Managed Care, Other (non HMO) | Admitting: Internal Medicine

## 2020-11-18 ENCOUNTER — Other Ambulatory Visit: Payer: Self-pay

## 2020-11-18 VITALS — BP 146/94 | HR 104 | Temp 98.1°F | Resp 18 | Ht 67.0 in | Wt 172.5 lb

## 2020-11-18 DIAGNOSIS — J449 Chronic obstructive pulmonary disease, unspecified: Secondary | ICD-10-CM | POA: Insufficient documentation

## 2020-11-18 DIAGNOSIS — N289 Disorder of kidney and ureter, unspecified: Secondary | ICD-10-CM | POA: Diagnosis not present

## 2020-11-18 DIAGNOSIS — C3491 Malignant neoplasm of unspecified part of right bronchus or lung: Secondary | ICD-10-CM | POA: Diagnosis present

## 2020-11-18 DIAGNOSIS — C3492 Malignant neoplasm of unspecified part of left bronchus or lung: Secondary | ICD-10-CM

## 2020-11-18 DIAGNOSIS — Z9981 Dependence on supplemental oxygen: Secondary | ICD-10-CM | POA: Insufficient documentation

## 2020-11-18 DIAGNOSIS — C3490 Malignant neoplasm of unspecified part of unspecified bronchus or lung: Secondary | ICD-10-CM

## 2020-11-18 NOTE — Progress Notes (Signed)
[facility] [phone]   [phone]  OFFICE PROGRESS NOTE  [name], NP [place] [address]  DIAGNOSIS: stage IV (T1a, N0, M1 a) non-small cell lung cancer, adenocarcinoma presented with multifocal disease in both lungs including 3 nodules in the left lung as well as 2 nodules in the right lung diagnosed in December 2019.    Molecular studies showed: KRAS G12C BCOR [id number]-subclonal RAD21 amplification  PDL 1 expression 1%.  PRIOR THERAPY:  1) Systemic chemotherapy with carboplatin for AUC of 5, Alimta 500 mg/M2 and Keytruda 200 mg IV every 3 weeks.  First dose April 12, 2018. Status post 4 cycles. starting from cycle #3, Keytruda was dropped and patient received treatment with Carboplatin for an AUC of 5 and Alimta 500 mg/m2 IV every 3 weeks.  2) Systemic chemotherapy with Alimta 500 mg/m2 IV every 3 weeks. Status post 3 cycles of single agent Alimta. Most recent dose given on 08/23/2018.  The treatment is currently on hold secondary to renal insufficiency.   CURRENT THERAPY:  Observation.  INTERVAL HISTORY: [name] 65 y.o. female returns to the clinic today for 57-monthfollow-up visit.  The patient is feeling fine today with no concerning complaints except for the baseline shortness of breath increased with exertion and she is currently on home oxygen.  She has a history of COPD but she is not followed by pulmonary medicine.  She denied having any current chest pain, cough or hemoptysis.  She denied having any nausea, vomiting, diarrhea or constipation.  She has no headache or visual changes.  She is here today for evaluation with repeat CT scan of the chest for restaging of her disease.  MEDICAL HISTORY: Past Medical History:  Diagnosis Date   Anemia    "long time ago" (12/06/2012)   Anxiety    Arthritis    "right knee real bad; in my hands bad" (12/06/2012)   Asthma    Celiac disease    Colon  polyps    COPD (chronic obstructive pulmonary disease) (HCC)    Coronary artery disease    [facility];  b. LHC 05/2008: Proximal RCA 25%, mid RCA stent patent.  c. Anormal nuc 2014 -> s/p LHC with severe mRCA stenosis s/p DES. d. Cath 04/2013 s/p DES to LAD.  e. LHC 08/2015 was stable.   Depression    "years ago" (12/06/2012)   Fibromyalgia    Gallstones    GERD (gastroesophageal reflux disease)    H/O hiatal hernia    Hepatitis    "not A, B, or C" (12/06/2012)   Hyperlipidemia    intol to statins and other chol agents due to elevated LFTs   Hypertension    Hypothyroidism    IBS (irritable bowel syndrome)    Interstitial cystitis    Lung cancer ([place] dx[place] 01/2018   Lung nodules    Bilateral   Migraines    "when I was younger" (12/06/2012)   Pneumonia    PONV (postoperative nausea and vomiting)    "Very bad"   Premature atrial contractions    PVC's (premature ventricular contractions)    Sinus headache    SVT (supraventricular tachycardia) (HCC)     ALLERGIES:  is allergic to ciprofloxacin, levofloxacin, statins, tricor [fenofibrate], compazine [prochlorperazine], latex, codeine, and metoprolol.  MEDICATIONS:  Current Outpatient Medications  Medication Sig Dispense Refill   albuterol (PROVENTIL) (5 MG/ML) 0.5% nebulizer solution Take 2.5  mg by nebulization every 6 (six) hours as needed for wheezing.     albuterol (VENTOLIN HFA) 108 (90 Base) MCG/ACT inhaler Inhale 1 puff into the lungs every 6 (six) hours as needed (wheezing/shortness of breath.).      ALPRAZolam (XANAX) 0.5 MG tablet for sedation before MRI scan; take 1 tab 1 hour before scan; may repeat 1 tab 15 min before scan 3 tablet 0   aspirin 81 MG chewable tablet Chew 1 tablet (81 mg total) by mouth daily.     BELSOMRA 20 MG TABS Take 20 mg by mouth at bedtime as needed for sleep.     buPROPion (WELLBUTRIN SR) 150 MG 12 hr tablet Take 150 mg by mouth daily.      DULoxetine (CYMBALTA) 60 MG capsule  Take 120 mg by mouth daily.     HYDROcodone-acetaminophen (NORCO) 7.5-325 MG per tablet Take 1 tablet by mouth every 6 (six) hours as needed for pain.     ipratropium-albuterol (DUONEB) 0.5-2.5 (3) MG/3ML SOLN Inhale 3 mLs into the lungs every 6 (six) hours as needed (Asthma).      loperamide (IMODIUM) 2 MG capsule Take 1 capsule (2 mg total) by mouth as needed for diarrhea or loose stools (Initiate if stool C diff -ve). 30 capsule 0   LORazepam (ATIVAN) 2 MG tablet Take 2 mg by mouth every evening.     metoprolol tartrate (LOPRESSOR) 25 MG tablet TAKE 1/2 TABLET(12.5 MG) BY MOUTH TWICE DAILY 90 tablet 2   montelukast (SINGULAIR) 10 MG tablet Take 10 mg by mouth at bedtime.     nitroGLYCERIN (NITROSTAT) 0.4 MG SL tablet Place 1 tablet (0.4 mg total) under the tongue every 5 (five) minutes as needed for chest pain. 25 tablet 12   oxybutynin (DITROPAN-XL) 5 MG 24 hr tablet Take 5 mg by mouth daily.     SYMBICORT 160-4.5 MCG/ACT inhaler Take 2 puffs by mouth 2 (two) times daily.  3   Vitamin D, Ergocalciferol, (DRISDOL) 1.25 MG (50000 UT) CAPS capsule Take 50,000 Units by mouth every 7 (seven) days. Wednesdays     No current facility-administered medications for this visit.    SURGICAL HISTORY:  Past Surgical History:  Procedure Laterality Date   CARDIAC CATHETERIZATION  04/2008; 05/2008   CARDIAC CATHETERIZATION N/A 08/07/2015   Procedure: Left Heart Cath and Coronary Angiography;  Surgeon: Sherren Mocha, MD;  Location: [facility];  Service: Cardiovascular;  Laterality: N/A;   CHOLECYSTECTOMY     CORONARY ANGIOPLASTY WITH STENT PLACEMENT  04/2008 12/06/2012   "1 + 1" (12/06/2012)   CORONARY STENT PLACEMENT  05/08/2013   DES TO LAD      DR MCALHANY   IR IMAGING GUIDED PORT INSERTION  04/29/2018   LEFT HEART CATHETERIZATION WITH CORONARY ANGIOGRAM N/A 12/06/2012   Procedure: LEFT HEART CATHETERIZATION WITH CORONARY ANGIOGRAM;  Surgeon: [name], MD;  Location[facility] CATH LAB;   Service: Cardiovascular;  Laterality: N/A;   LEFT HEART CATHETERIZATION WITH CORONARY ANGIOGRAM N/A 05/08/2013   Procedure: LEFT HEART CATHETERIZATION WITH CORONARY ANGIOGRAM;  Surgeon: [name], MD;  Location: [facility] CATH LAB;  Service: Cardiovascular;  Laterality: N/A;   LEFT HEART CATHETERIZATION WITH CORONARY ANGIOGRAM N/A 01/04/2014   Procedure: LEFT HEART CATHETERIZATION WITH CORONARY ANGIOGRAM;  Surgeon: [name], MD;  Location: [facility] CATH LAB;  Service: Cardiovascular;  Laterality: N/A;   PERCUTANEOUS CORONARY STENT INTERVENTION (PCI-S)  12/06/2012   Procedure: PERCUTANEOUS CORONARY STENT INTERVENTION (PCI-S);  Surgeon: [name],  MD;  Location: [facility]ATH LAB;  Service: Cardiovascular;;   PERCUTANEOUS CORONARY STENT INTERVENTION (PCI-S)  05/08/2013   Procedure: PERCUTANEOUS CORONARY STENT INTERVENTION (PCI-S);  Surgeon: [name], MD;  Location: [facility] CATH LAB;  Service: Cardiovascular;;  mid LAD    TUBAL LIGATION     VAGINAL HYSTERECTOMY     VIDEO BRONCHOSCOPY WITH ENDOBRONCHIAL NAVIGATION N/A 02/17/2018   Procedure: VIDEO BRONCHOSCOPY WITH ENDOBRONCHIAL NAVIGATION;  Surgeon: Melrose Nakayama, MD;  Location: [facility];  Service: Thoracic;  Laterality: N/A;    REVIEW OF SYSTEMS:  A comprehensive review of systems was negative except for: Constitutional: positive for fatigue Respiratory: positive for dyspnea on exertion   PHYSICAL EXAMINATION: General appearance: alert, cooperative, fatigued, and no distress Head: Normocephalic, without obvious abnormality, atraumatic Neck: no adenopathy, no JVD, supple, symmetrical, trachea midline, and thyroid not enlarged, symmetric, no tenderness/mass/nodules Lymph nodes: Cervical, supraclavicular, and axillary nodes normal. Resp: clear to auscultation bilaterally Back: symmetric, no curvature. ROM normal. No CVA tenderness. Cardio: regular rate and rhythm, S1, S2 normal, no murmur, click, rub or gallop GI:  soft, non-tender; bowel sounds normal; no masses,  no organomegaly Extremities: extremities normal, atraumatic, no cyanosis or edema  ECOG PERFORMANCE STATUS: 1 - Symptomatic but completely ambulatory  Blood pressure (!) 146/94, pulse (!) 104, temperature 98.1 F (36.7 C), temperature source Tympanic, resp. rate 18, height 5' 7" (1.702 m), weight 172 lb 8 oz (78.2 kg), SpO2 98 %.  LABORATORY DATA: Lab Results  Component Value Date   WBC 6.8 11/08/2020   HGB 11.7 (L) 11/08/2020   HCT 35.0 (L) 11/08/2020   MCV 92.8 11/08/2020   PLT 190 11/08/2020      Chemistry      Component Value Date/Time   NA 143 11/08/2020 1131   NA 143 01/10/2018 1629   K 4.5 11/08/2020 1131   CL 110 11/08/2020 1131   CO2 24 11/08/2020 1131   BUN 20 11/08/2020 1131   BUN 14 01/10/2018 1629   CREATININE 1.19 (H) 11/08/2020 1131   CREATININE 0.68 08/05/2015 1456      Component Value Date/Time   CALCIUM 9.3 11/08/2020 1131   ALKPHOS 95 11/08/2020 1131   AST 19 11/08/2020 1131   ALT 26 11/08/2020 1131   BILITOT 0.4 11/08/2020 1131       RADIOGRAPHIC STUDIES: CT Chest W Contrast  Result Date: 11/11/2020 CLINICAL DATA:  Lung cancer treated with chemotherapy and immunotherapy. EXAM: CT CHEST WITH CONTRAST TECHNIQUE: Multidetector CT imaging of the chest was performed during intravenous contrast administration. CONTRAST:  40m OMNIPAQUE IOHEXOL 350 MG/ML SOLN COMPARISON:  05/07/2020 and 11/15/2018. FINDINGS: Cardiovascular: Right IJ Port-A-Cath terminates in the low SVC. Atherosclerotic calcification of the aorta and coronary arteries. Heart size normal. No pericardial effusion. Mediastinum/Nodes: No pathologically enlarged mediastinal, hilar or axillary lymph nodes. Esophagus is unremarkable. Lungs/Pleura: Centrilobular emphysema. 1.0 x 1.6 cm subsolid nodule in the posterior segment right upper lobe is stable in size from 05/07/2020, with a probable 6 mm internal solid component. When compared with  11/15/2018, lesion has increased in overall size, previously measuring 8 x 8 mm. 6 mm nodule in superior segment left lower lobe, adjacent to the major fissure (5/33), unchanged. Area of somewhat amorphous appearing ground-glass in the adjacent superior segment left lower lobe is seen with adjacent pleural thickening (5/46). Lack of discrete borders makes measurement difficult. Findings appear grossly similar to 05/07/2020. Multiple additional scattered small nodular ground-glass lesions bilaterally, as before. No pleural fluid. Airway is unremarkable. Upper  Abdomen: Visualized portions of the liver, adrenal glands, kidneys, spleen, pancreas, stomach and bowel are grossly unremarkable. Cholecystectomy. Porta hepatis lymph nodes are not enlarged by CT size criteria. Musculoskeletal: T5 compression fracture is unchanged. Old lower right rib fracture. No worrisome lytic or sclerotic lesions. IMPRESSION: 1. Subsolid posterior segment right upper lobe nodule, stable from 05/07/2020 but increased in size from 11/15/2018. Lesion remains worrisome for adenocarcinoma. 2. Multiple additional areas of nodularity, ranging from ground-glass to solid, similar to 05/17/2020. Again, adenocarcinoma can not be excluded. Continued attention on follow-up exams is warranted. 3. Aortic atherosclerosis (ICD10-I70.0). Coronary artery calcification. 4.  Emphysema (ICD10-J43.9). Electronically Signed   By: Lorin Picket M.D.   On: 11/11/2020 10:22     ASSESSMENT AND PLAN: This is a very pleasant 64 years old white female recently diagnosed with a stage IV non-small cell lung cancer, adenocarcinoma with no actionable mutations and PDL 1 expression of 1% diagnosed in December 2019 and presented with bilateral pulmonary nodules. The patient was initially started on treatment with carboplatin, Alimta and Keytruda status post 4 cycles but Keytruda was discontinued after cycle #2 secondary to significant skin rash. She was treated with  maintenance treatment with single agent Alimta for 3 cycles but this is currently on hold since June 2020 secondary to worsening renal insufficiency. The patient is currently on observation and she is feeling fine today with no concerning complaints except for the baseline shortness of breath secondary to COPD. She had repeat CT scan of the chest performed recently.  I personally and independently reviewed the scans and discussed the results with the patient today. Her scan showed no concerning findings for disease progression except for a slightly enlarging groundglass opacity suspicious for slowly growing adenocarcinoma versus inflammatory process. I recommended for the patient to continue on observation with repeat CT scan of the chest in 6 months. Regarding the COPD, I will refer the patient to pulmonary medicine for evaluation and management. She was advised to call immediately if she has any other concerning symptoms in the interval.  The patient voices understanding of current disease status and treatment options and is in agreement with the current care plan. All questions were answered. The patient knows to call the clinic with any problems, questions or concerns. We can certainly see the patient much sooner if necessary.   Disclaimer: This note was dictated with voice recognition software. Similar sounding words can inadvertently be transcribed and may not be corrected upon review.

## 2020-11-19 ENCOUNTER — Telehealth: Payer: Self-pay | Admitting: Internal Medicine

## 2020-11-19 NOTE — Telephone Encounter (Signed)
Scheduled appt per 9/19 los - mailed letter with appt date and time   

## 2020-12-13 ENCOUNTER — Telehealth: Payer: Self-pay | Admitting: Medical Oncology

## 2020-12-13 NOTE — Telephone Encounter (Signed)
Asking about pulmonary referral. Called in referral to pulmonary.[facility] has referral.

## 2020-12-26 ENCOUNTER — Other Ambulatory Visit: Payer: Self-pay

## 2020-12-26 ENCOUNTER — Ambulatory Visit: Payer: Managed Care, Other (non HMO) | Admitting: Pulmonary Disease

## 2020-12-26 VITALS — BP 114/68 | HR 93

## 2020-12-26 DIAGNOSIS — J449 Chronic obstructive pulmonary disease, unspecified: Secondary | ICD-10-CM | POA: Diagnosis not present

## 2020-12-26 DIAGNOSIS — Z23 Encounter for immunization: Secondary | ICD-10-CM | POA: Diagnosis not present

## 2020-12-26 DIAGNOSIS — J9611 Chronic respiratory failure with hypoxia: Secondary | ICD-10-CM

## 2020-12-26 DIAGNOSIS — C3492 Malignant neoplasm of unspecified part of left bronchus or lung: Secondary | ICD-10-CM | POA: Diagnosis not present

## 2020-12-26 MED ORDER — BREZTRI AEROSPHERE 160-9-4.8 MCG/ACT IN AERO: 2.0000 | INHALATION_SPRAY | Freq: Two times a day (BID) | RESPIRATORY_TRACT | 0 refills | Status: DC

## 2020-12-26 NOTE — Patient Instructions (Addendum)
Thank you for visiting Dr. Valeta Harms at [facility]. Today we recommend the following:  Breztri samples today, new prescription   New Spacer with instructions   Return in about 4 weeks (around 01/23/2021) for with APP or Dr. Valeta Harms.    Please do your part to reduce the spread of COVID-19.

## 2020-12-26 NOTE — Addendum Note (Signed)
Addended by: Fran Lowes on: 12/26/2020 10:19 AM   Modules accepted: Orders

## 2020-12-26 NOTE — Progress Notes (Signed)
Synopsis: Referred in Oct 2022 for lung cancer/COPD by Curt Bears, MD  Subjective:   PATIENT ID: [name] GENDER: female DOB: 1955-03-06, MRN: [id number]  Chief Complaint  Patient presents with   Consult    This is a 65 year old female, chronic respiratory failure, COPD, longstanding history of smoking, Patient had pulmonary function tests in 2019 at the time of her diagnosis of multifocal adenocarcinoma.  Patient underwent bronchoscopy by Dr. Roxan Hockey with tissue biopsy confirming adenocarcinoma of the lung.  She had pulmonary function test completed at the time with an FEV1 of 31% predicted, 0.87 L, DLCO 51%.,  Stage IV COPD diagnosis.  She is able to complete most of her activities of daily living but she is short of breath with exertion and at rest.  She has mMRC class IV symptoms.  She is currently on dual therapy ICS, LABA.  With as needed albuterol.  She is not had any recent exacerbations that required hospitalization.  She has been treated with the chemo as well as immune therapy has not tolerated that well.  Follows with Dr. Earlie Server from medical oncology had a recent CT scan of the chest which showed progressive change and a subsolid lesion in the right upper lobe.   Past Medical History:  Diagnosis Date   Anemia    "long time ago" (12/06/2012)   Anxiety    Arthritis    "right knee real bad; in my hands bad" (12/06/2012)   Asthma    Celiac disease    Colon polyps    COPD (chronic obstructive pulmonary disease) (HCC)    Coronary artery disease    a. s/p Xience DES to [facility] 04/2008;  b. LHC 05/2008: Proximal RCA 25%, mid RCA stent patent.  c. Anormal nuc 2014 -> s/p LHC with severe mRCA stenosis s/p DES. d. Cath 04/2013 s/p DES to LAD.  e. LHC 08/2015 was stable.   Depression    "years ago" (12/06/2012)   Fibromyalgia    Gallstones    GERD (gastroesophageal reflux disease)    H/O hiatal hernia    Hepatitis    "not A, B, or C" (12/06/2012)   Hyperlipidemia    intol  to statins and other chol agents due to elevated LFTs   Hypertension    Hypothyroidism    IBS (irritable bowel syndrome)    Interstitial cystitis    Lung cancer ([place]) dx'd 01/2018   Lung nodules    Bilateral   Migraines    "when I was younger" (12/06/2012)   Pneumonia    PONV (postoperative nausea and vomiting)    "Very bad"   Premature atrial contractions    PVC's (premature ventricular contractions)    Sinus headache    SVT (supraventricular tachycardia) ([place])      Family History  Problem Relation Age of Onset   Colon cancer Father 36   Lung disease Father    Emphysema Mother    Drug abuse Brother    Hepatitis C Brother    Drug abuse Brother    Celiac disease Son    Celiac disease Grandson    Down syndrome Grandson    Diabetes Granddaughter    CAD Neg Hx      Past Surgical History:  Procedure Laterality Date   CARDIAC CATHETERIZATION  04/2008; 05/2008   CARDIAC CATHETERIZATION N/A 08/07/2015   Procedure: Left Heart Cath and Coronary Angiography;  Surgeon: Sherren Mocha, MD;  Location: [place] [facility];  Service: Cardiovascular;  Laterality: N/A;  CHOLECYSTECTOMY     CORONARY ANGIOPLASTY WITH STENT PLACEMENT  04/2008 12/06/2012   "1 + 1" (12/06/2012)   CORONARY STENT PLACEMENT  05/08/2013   DES TO LAD      DR MCALHANY   IR IMAGING GUIDED PORT INSERTION  04/29/2018   LEFT HEART CATHETERIZATION WITH CORONARY ANGIOGRAM N/A 12/06/2012   Procedure: LEFT HEART CATHETERIZATION WITH CORONARY ANGIOGRAM;  Surgeon: [name], MD;  Location: [facility] CATH LAB;  Service: Cardiovascular;  Laterality: N/A;   LEFT HEART CATHETERIZATION WITH CORONARY ANGIOGRAM N/A 05/08/2013   Procedure: LEFT HEART CATHETERIZATION WITH CORONARY ANGIOGRAM;  Surgeon: [name], MD;  Location: [facility] CATH LAB;  Service: Cardiovascular;  Laterality: N/A;   LEFT HEART CATHETERIZATION WITH CORONARY ANGIOGRAM N/A 01/04/2014   Procedure: LEFT HEART CATHETERIZATION WITH CORONARY ANGIOGRAM;  Surgeon:  [name], MD;  Location: [facility] CATH LAB;  Service: Cardiovascular;  Laterality: N/A;   PERCUTANEOUS CORONARY STENT INTERVENTION (PCI-S)  12/06/2012   Procedure: PERCUTANEOUS CORONARY STENT INTERVENTION (PCI-S);  Surgeon: [name], MD;  Location: [facility] CATH LAB;  Service: Cardiovascular;;   PERCUTANEOUS CORONARY STENT INTERVENTION (PCI-S)  05/08/2013   Procedure: PERCUTANEOUS CORONARY STENT INTERVENTION (PCI-S);  Surgeon: [name], MD;  Location: [facility] CATH LAB;  Service: Cardiovascular;;  mid LAD    TUBAL LIGATION     VAGINAL HYSTERECTOMY     VIDEO BRONCHOSCOPY WITH ENDOBRONCHIAL NAVIGATION N/A 02/17/2018   Procedure: VIDEO BRONCHOSCOPY WITH ENDOBRONCHIAL NAVIGATION;  Surgeon: Melrose Nakayama, MD;  Location: [place];  Service: Thoracic;  Laterality: N/A;    Social History   Socioeconomic History   Marital status: Married    Spouse name: Jimmy   Number of children: 4   Years of education: Not on file   Highest education level: Bachelor's degree (e.g., BA, AB, BS)  Occupational History    Comment: owned a day care  Tobacco Use   Smoking status: Former    Packs/day: 0.50    Years: 30.00    Pack years: 15.00    Types: Cigarettes    Quit date: 2009    Years since quitting: 13.8   Smokeless tobacco: Never   Tobacco comments:    12/06/2012 "quit smoking cigarettes ~ 8 yr ago"  Vaping Use   Vaping Use: Never used  Substance and Sexual Activity   Alcohol use: No   Drug use: No   Sexual activity: Yes  Other Topics Concern   Not on file  Social History Narrative   Lives with husband   Social Determinants of Health   Financial Resource Strain: Not on file  Food Insecurity: Not on file  Transportation Needs: Not on file  Physical Activity: Not on file  Stress: Not on file  Social Connections: Not on file  Intimate Partner Violence: Not on file     Allergies  Allergen Reactions   Ciprofloxacin Hives   Levofloxacin     UNSPECIFIED REACTION,  BUT LIKELY HIVES > HIVES WITH CIPRO   Statins Other (See Comments)    Liver enzymes Liver enzymes   Tricor [Fenofibrate] Hives and Itching   Compazine [Prochlorperazine]     Hx of tardive dyskinesia with compazine   Latex Other (See Comments)    Patient states "Messes with my skin."   Codeine Nausea And Vomiting   Metoprolol Other (See Comments)    Does not tolerate beta blockers 10/02/2013-pt states she can tolerate 25 mg and lower      Outpatient Medications Prior to Visit  Medication Sig Dispense Refill   albuterol (PROVENTIL) (5 MG/ML) 0.5% nebulizer solution Take 2.5 mg by nebulization every 6 (six) hours as needed for wheezing.     albuterol (VENTOLIN HFA) 108 (90 Base) MCG/ACT inhaler Inhale 1 puff into the lungs every 6 (six) hours as needed (wheezing/shortness of breath.).      ALPRAZolam (XANAX) 0.5 MG tablet for sedation before MRI scan; take 1 tab 1 hour before scan; may repeat 1 tab 15 min before scan 3 tablet 0   aspirin 81 MG chewable tablet Chew 1 tablet (81 mg total) by mouth daily.     BELSOMRA 20 MG TABS Take 20 mg by mouth at bedtime as needed for sleep.     buPROPion (WELLBUTRIN SR) 150 MG 12 hr tablet Take 150 mg by mouth daily.      DULoxetine (CYMBALTA) 60 MG capsule Take 120 mg by mouth daily.     HYDROcodone-acetaminophen (NORCO) 7.5-325 MG per tablet Take 1 tablet by mouth every 6 (six) hours as needed for pain.     LORazepam (ATIVAN) 2 MG tablet Take 2 mg by mouth every evening.     metoprolol tartrate (LOPRESSOR) 25 MG tablet TAKE 1/2 TABLET(12.5 MG) BY MOUTH TWICE DAILY 90 tablet 2   montelukast (SINGULAIR) 10 MG tablet Take 10 mg by mouth at bedtime.     nitroGLYCERIN (NITROSTAT) 0.4 MG SL tablet Place 1 tablet (0.4 mg total) under the tongue every 5 (five) minutes as needed for chest pain. 25 tablet 12   oxybutynin (DITROPAN-XL) 5 MG 24 hr tablet Take 5 mg by mouth daily.     SYMBICORT 160-4.5 MCG/ACT inhaler Take 2 puffs by mouth 2 (two) times daily.  3    Vitamin D, Ergocalciferol, (DRISDOL) 1.25 MG (50000 UT) CAPS capsule Take 50,000 Units by mouth every 7 (seven) days. Wednesdays     ipratropium-albuterol (DUONEB) 0.5-2.5 (3) MG/3ML SOLN Inhale 3 mLs into the lungs every 6 (six) hours as needed (Asthma).      loperamide (IMODIUM) 2 MG capsule Take 1 capsule (2 mg total) by mouth as needed for diarrhea or loose stools (Initiate if stool C diff -ve). 30 capsule 0   No facility-administered medications prior to visit.    Review of Systems  Constitutional:  Negative for chills, fever, malaise/fatigue and weight loss.  HENT:  Negative for hearing loss, sore throat and tinnitus.   Eyes:  Negative for blurred vision and double vision.  Respiratory:  Positive for cough, sputum production and shortness of breath. Negative for hemoptysis, wheezing and stridor.   Cardiovascular:  Negative for chest pain, palpitations, orthopnea, leg swelling and PND.  Gastrointestinal:  Negative for abdominal pain, constipation, diarrhea, heartburn, nausea and vomiting.  Genitourinary:  Negative for dysuria, hematuria and urgency.  Musculoskeletal:  Negative for joint pain and myalgias.  Skin:  Negative for itching and rash.  Neurological:  Negative for dizziness, tingling, weakness and headaches.  Endo/Heme/Allergies:  Negative for environmental allergies. Does not bruise/bleed easily.  Psychiatric/Behavioral:  Negative for depression. The patient is not nervous/anxious and does not have insomnia.   All other systems reviewed and are negative.   Objective:  Physical Exam Vitals reviewed.  Constitutional:      General: She is not in acute distress.    Appearance: She is well-developed.  HENT:     Head: Normocephalic and atraumatic.     Mouth/Throat:     Pharynx: No oropharyngeal exudate.  Eyes:     Conjunctiva/sclera: Conjunctivae normal.  Pupils: Pupils are equal, round, and reactive to light.  Neck:     Vascular: No JVD.     Trachea: No tracheal  deviation.     Comments: Loss of supraclavicular fat Cardiovascular:     Rate and Rhythm: Normal rate and regular rhythm.     Heart sounds: S1 normal and S2 normal.     Comments: Distant heart tones Pulmonary:     Effort: No tachypnea or accessory muscle usage.     Breath sounds: No stridor. Decreased breath sounds (throughout all lung fields) present. No rhonchi or rales.     Comments: Diminished breath sounds bilaterally Abdominal:     General: Bowel sounds are normal. There is no distension.     Palpations: Abdomen is soft.     Tenderness: There is no abdominal tenderness.  Musculoskeletal:        General: Deformity (muscle wasting ) present.  Skin:    General: Skin is warm and dry.     Capillary Refill: Capillary refill takes less than 2 seconds.     Findings: No rash.  Neurological:     Mental Status: She is alert and oriented to person, place, and time.  Psychiatric:        Behavior: Behavior normal.     Vitals:   12/26/20 0941  BP: 114/68  Pulse: 93  SpO2: 98%   98% on RA BMI Readings from Last 3 Encounters:  11/18/20 27.02 kg/m  06/05/20 26.16 kg/m  05/08/20 26.00 kg/m   Wt Readings from Last 3 Encounters:  11/18/20 172 lb 8 oz (78.2 kg)  06/05/20 167 lb (75.8 kg)  05/08/20 166 lb (75.3 kg)     CBC    Component Value Date/Time   WBC 6.8 11/08/2020 1131   WBC 9.4 10/11/2019 1530   RBC 3.77 (L) 11/08/2020 1131   HGB 11.7 (L) 11/08/2020 1131   HGB 15.1 01/10/2018 1629   HCT 35.0 (L) 11/08/2020 1131   HCT 44.1 01/10/2018 1629   PLT 190 11/08/2020 1131   PLT 310 01/10/2018 1629   MCV 92.8 11/08/2020 1131   MCV 86 01/10/2018 1629   MCH 31.0 11/08/2020 1131   MCHC 33.4 11/08/2020 1131   RDW 13.2 11/08/2020 1131   RDW 15.1 01/10/2018 1629   LYMPHSABS 1.8 11/08/2020 1131   LYMPHSABS 2.5 10/06/2016 1511   MONOABS 0.6 11/08/2020 1131   EOSABS 0.2 11/08/2020 1131   EOSABS 0.2 10/06/2016 1511   BASOSABS 0.1 11/08/2020 1131   BASOSABS 0.1 10/06/2016  1511    Chest Imaging:  11/08/2020: CT scan of the chest reveals a 1 x 1.6 sup solid nodule in the right upper lobe posterior consistent with likely a slow-growing adenocarcinoma.  She has a known history of multifocal stage IV adenocarcinoma. The patient's images have been independently reviewed by me.    Pulmonary Functions Testing Results: PFT Results Latest Ref Rng & Units 02/03/2018  FVC-Pre L 2.04  FVC-Predicted Pre % 56  FVC-Post L 2.27  FVC-Predicted Post % 63  Pre FEV1/FVC % % 41  Post FEV1/FCV % % 38  FEV1-Pre L 0.84  FEV1-Predicted Pre % 30  FEV1-Post L 0.87  DLCO uncorrected ml/min/mmHg 14.56  DLCO UNC% % 51  DLVA Predicted % 80  TLC L 6.18  TLC % Predicted % 112  RV % Predicted % 168    FeNO:   Pathology:   2019 bronchoscopy: Non-small cell, adenocarcinoma  Echocardiogram:   Heart Catheterization:     Assessment &  Plan:     ICD-10-CM   1. Adenocarcinoma of left lung, stage 4 (HCC)  C34.92     2. Stage 4 very severe COPD by GOLD classification ([place])  J44.9     3. Chronic hypoxemic respiratory failure (HCC)  J96.11       Discussion:  This is a 65 year old female, chronic hypoxemic respiratory failure, POC, 3 L pulsed, stage IV adenocarcinoma of the lung, stage IV COPD, FEV1 of 30% predicted, 0.8 L in 2019.  Plan: We will change her inhaler regimen Add Breztri 2 puffs, twice daily Start use of spacer, instructions today Continue albuterol for shortness of breath and wheezing. We also discussed the possibility of enrollment in pulmonary rehab    Current Outpatient Medications:    albuterol (PROVENTIL) (5 MG/ML) 0.5% nebulizer solution, Take 2.5 mg by nebulization every 6 (six) hours as needed for wheezing., Disp: , Rfl:    albuterol (VENTOLIN HFA) 108 (90 Base) MCG/ACT inhaler, Inhale 1 puff into the lungs every 6 (six) hours as needed (wheezing/shortness of breath.). , Disp: , Rfl:    ALPRAZolam (XANAX) 0.5 MG tablet, for sedation before MRI  scan; take 1 tab 1 hour before scan; may repeat 1 tab 15 min before scan, Disp: 3 tablet, Rfl: 0   aspirin 81 MG chewable tablet, Chew 1 tablet (81 mg total) by mouth daily., Disp: , Rfl:    BELSOMRA 20 MG TABS, Take 20 mg by mouth at bedtime as needed for sleep., Disp: , Rfl:    buPROPion (WELLBUTRIN SR) 150 MG 12 hr tablet, Take 150 mg by mouth daily. , Disp: , Rfl:    DULoxetine (CYMBALTA) 60 MG capsule, Take 120 mg by mouth daily., Disp: , Rfl:    HYDROcodone-acetaminophen (NORCO) 7.5-325 MG per tablet, Take 1 tablet by mouth every 6 (six) hours as needed for pain., Disp: , Rfl:    LORazepam (ATIVAN) 2 MG tablet, Take 2 mg by mouth every evening., Disp: , Rfl:    metoprolol tartrate (LOPRESSOR) 25 MG tablet, TAKE 1/2 TABLET(12.5 MG) BY MOUTH TWICE DAILY, Disp: 90 tablet, Rfl: 2   montelukast (SINGULAIR) 10 MG tablet, Take 10 mg by mouth at bedtime., Disp: , Rfl:    nitroGLYCERIN (NITROSTAT) 0.4 MG SL tablet, Place 1 tablet (0.4 mg total) under the tongue every 5 (five) minutes as needed for chest pain., Disp: 25 tablet, Rfl: 12   oxybutynin (DITROPAN-XL) 5 MG 24 hr tablet, Take 5 mg by mouth daily., Disp: , Rfl:    SYMBICORT 160-4.5 MCG/ACT inhaler, Take 2 puffs by mouth 2 (two) times daily., Disp: , Rfl: 3   Vitamin D, Ergocalciferol, (DRISDOL) 1.25 MG (50000 UT) CAPS capsule, Take 50,000 Units by mouth every 7 (seven) days. Wednesdays, Disp: , Rfl:    ipratropium-albuterol (DUONEB) 0.5-2.5 (3) MG/3ML SOLN, Inhale 3 mLs into the lungs every 6 (six) hours as needed (Asthma). , Disp: , Rfl:    loperamide (IMODIUM) 2 MG capsule, Take 1 capsule (2 mg total) by mouth as needed for diarrhea or loose stools (Initiate if stool C diff -ve)., Disp: 30 capsule, Rfl: 0    Garner Nash, DO [facility] 12/26/2020 9:47 AM

## 2021-01-27 ENCOUNTER — Other Ambulatory Visit: Payer: Self-pay

## 2021-01-27 ENCOUNTER — Encounter: Payer: Self-pay | Admitting: Adult Health

## 2021-01-27 ENCOUNTER — Ambulatory Visit: Payer: Managed Care, Other (non HMO) | Admitting: Adult Health

## 2021-01-27 DIAGNOSIS — J9611 Chronic respiratory failure with hypoxia: Secondary | ICD-10-CM

## 2021-01-27 DIAGNOSIS — C3492 Malignant neoplasm of unspecified part of left bronchus or lung: Secondary | ICD-10-CM | POA: Diagnosis not present

## 2021-01-27 DIAGNOSIS — J449 Chronic obstructive pulmonary disease, unspecified: Secondary | ICD-10-CM

## 2021-01-27 MED ORDER — IPRATROPIUM-ALBUTEROL 0.5-2.5 (3) MG/3ML IN SOLN: 3.0000 mL | Freq: Four times a day (QID) | RESPIRATORY_TRACT | 3 refills | Status: DC | PRN

## 2021-01-27 NOTE — Assessment & Plan Note (Signed)
Continue on oxygen 3 L.  O2 saturation goal greater than 88 to 90%.

## 2021-01-27 NOTE — Assessment & Plan Note (Signed)
Stage IV lung cancer adenocarcinoma.  Continue follow-up with oncology and serial CT.

## 2021-01-27 NOTE — Progress Notes (Signed)
@Patient  ID: [name], female    DOB: 01/08/1956, 65 y.o.   MRN: [id number]  Chief Complaint  Patient presents with   Follow-up    Referring provider: [name], NP  HPI: 65 year old female former smoker seen for pulmonary consult December 26, 2020 for COPD and oxygen dependent respiratory failure patient has known history of multifocal stage IV adenocarcinoma diagnosed in 2019 (status post chemo)  Medical history significant for severe COPD and chronic respiratory failure on oxygen  TEST/EVENTS :  11/08/2020: CT scan of the chest reveals a 1 x 1.6 sup solid nodule in the right upper lobe posterior consistent with likely a slow-growing adenocarcinoma.  She has a known history of multifocal stage IV adenocarcinoma.  2019 bronchoscopy: Non-small cell, adenocarcinoma  PRIOR THERAPY:  1) Systemic chemotherapy with carboplatin for AUC of 5, Alimta 500 mg/M2 and Keytruda 200 mg IV every 3 weeks.  First dose April 12, 2018. Status post 4 cycles. starting from cycle #3, Keytruda was dropped and patient received treatment with Carboplatin for an AUC of 5 and Alimta 500 mg/m2 IV every 3 weeks.  2) Systemic chemotherapy with Alimta 500 mg/m2 IV every 3 weeks. Status post 3 cycles of single agent Alimta. Most recent dose given on 08/23/2018.  The treatment is currently on hold secondary to renal insufficiency.  01/27/2021 Follow up : COPD and O2 RF  Returns for 1 month follow up.  Patient was seen last month for a pulmonary consult to establish for COPD and oxygen dependent respiratory failure.  Previous pulmonary function testing showed severe COPD with FEV1 at 30%.  She was changed from Symbicort to [facility] inhaler last visit.  She does feel that this is helped some.  She has slightly decreased shortness of breath.  But has high symptom burden with decreased activity tolerance.  She denies any hemoptysis chest pain orthopnea PND or leg swelling.  Known history of stage IV lung cancer  followed by oncology.  Previously on chemo.  Currently under observation.  Most recent CT chest showed a subsolid posterior segment right upper lobe nodule stable from May 07, 2020 but increased size from November 15, 2018.  Multiple areas of nodularity similar to May 17, 2020.  Remains on oxygen 3l/m . Doing well with no increased oxygen demands.  Has POC for travel.   Lives at home. Does not drive. Husband helps her. Sedentary lifestyle. Unable to do light housework . Gets winded with minimal activities.   Covid vaccine x 2 .We discussed getting the booster.  Influenza vaccine up to date.   Allergies  Allergen Reactions   Ciprofloxacin Hives   Levofloxacin     UNSPECIFIED REACTION, BUT LIKELY HIVES > HIVES WITH CIPRO   Statins Other (See Comments)    Liver enzymes Liver enzymes   Tricor [Fenofibrate] Hives and Itching   Compazine [Prochlorperazine]     Hx of tardive dyskinesia with compazine   Latex Other (See Comments)    Patient states "Messes with my skin."   Codeine Nausea And Vomiting   Metoprolol Other (See Comments)    Does not tolerate beta blockers 10/02/2013-pt states she can tolerate 25 mg and lower     Immunization History  Administered Date(s) Administered   Fluad Quad(high Dose 65+) 12/26/2020   Influenza,inj,Quad PF,6+ Mos 12/07/2012    Past Medical History:  Diagnosis Date   Anemia    "long time ago" (12/06/2012)   Anxiety    Arthritis    "right knee real bad;  in my hands bad" (12/06/2012)   Asthma    Celiac disease    Colon polyps    COPD (chronic obstructive pulmonary disease) (HCC)    Coronary artery disease    a. s/p Xience DES to [facility] 04/2008;  b. LHC 05/2008: Proximal RCA 25%, mid RCA stent patent.  c. Anormal nuc 2014 -> s/p LHC with severe mRCA stenosis s/p DES. d. Cath 04/2013 s/p DES to LAD.  e. LHC 08/2015 was stable.   Depression    "years ago" (12/06/2012)   Fibromyalgia    Gallstones    GERD (gastroesophageal reflux disease)    H/O  hiatal hernia    Hepatitis    "not A, B, or C" (12/06/2012)   Hyperlipidemia    intol to statins and other chol agents due to elevated LFTs   Hypertension    Hypothyroidism    IBS (irritable bowel syndrome)    Interstitial cystitis    Lung cancer ([place]) dx'd 01/2018   Lung nodules    Bilateral   Migraines    "when I was younger" (12/06/2012)   Pneumonia    PONV (postoperative nausea and vomiting)    "Very bad"   Premature atrial contractions    PVC's (premature ventricular contractions)    Sinus headache    SVT (supraventricular tachycardia) (HCC)     Tobacco History: Social History   Tobacco Use  Smoking Status Former   Packs/day: 0.50   Years: 30.00   Pack years: 15.00   Types: Cigarettes   Quit date: 2009   Years since quitting: 13.9  Smokeless Tobacco Never  Tobacco Comments   12/06/2012 "quit smoking cigarettes ~ 8 yr ago"   Counseling given: Not Answered Tobacco comments: 12/06/2012 "quit smoking cigarettes ~ 8 yr ago"   Outpatient Medications Prior to Visit  Medication Sig Dispense Refill   albuterol (PROVENTIL) (5 MG/ML) 0.5% nebulizer solution Take 2.5 mg by nebulization every 6 (six) hours as needed for wheezing.     albuterol (VENTOLIN HFA) 108 (90 Base) MCG/ACT inhaler Inhale 1 puff into the lungs every 6 (six) hours as needed (wheezing/shortness of breath.).      ALPRAZolam (XANAX) 0.5 MG tablet for sedation before MRI scan; take 1 tab 1 hour before scan; may repeat 1 tab 15 min before scan 3 tablet 0   aspirin 81 MG chewable tablet Chew 1 tablet (81 mg total) by mouth daily.     BELSOMRA 20 MG TABS Take 20 mg by mouth at bedtime as needed for sleep.     Budeson-Glycopyrrol-Formoterol (BREZTRI AEROSPHERE) 160-9-4.8 MCG/ACT AERO Inhale 2 puffs into the lungs in the morning and at bedtime. 10.7 g 0   buPROPion (WELLBUTRIN SR) 150 MG 12 hr tablet Take 150 mg by mouth daily.      DULoxetine (CYMBALTA) 60 MG capsule Take 120 mg by mouth daily.     LORazepam  (ATIVAN) 2 MG tablet Take 2 mg by mouth every evening.     metoprolol tartrate (LOPRESSOR) 25 MG tablet TAKE 1/2 TABLET(12.5 MG) BY MOUTH TWICE DAILY 90 tablet 2   montelukast (SINGULAIR) 10 MG tablet Take 10 mg by mouth at bedtime.     Vitamin D, Ergocalciferol, (DRISDOL) 1.25 MG (50000 UT) CAPS capsule Take 50,000 Units by mouth every 7 (seven) days. Wednesdays     Budeson-Glycopyrrol-Formoterol (BREZTRI AEROSPHERE) 160-9-4.8 MCG/ACT AERO Inhale 2 puffs into the lungs in the morning and at bedtime. (Patient not taking: Reported on 01/27/2021) 10.7 g 0   HYDROcodone-acetaminophen (  NORCO) 7.5-325 MG per tablet Take 1 tablet by mouth every 6 (six) hours as needed for pain. (Patient not taking: Reported on 01/27/2021)     nitroGLYCERIN (NITROSTAT) 0.4 MG SL tablet Place 1 tablet (0.4 mg total) under the tongue every 5 (five) minutes as needed for chest pain. (Patient not taking: Reported on 01/27/2021) 25 tablet 12   ipratropium-albuterol (DUONEB) 0.5-2.5 (3) MG/3ML SOLN Inhale 3 mLs into the lungs every 6 (six) hours as needed (Asthma).      loperamide (IMODIUM) 2 MG capsule Take 1 capsule (2 mg total) by mouth as needed for diarrhea or loose stools (Initiate if stool C diff -ve). 30 capsule 0   oxybutynin (DITROPAN-XL) 5 MG 24 hr tablet Take 5 mg by mouth daily. (Patient not taking: Reported on 01/27/2021)     SYMBICORT 160-4.5 MCG/ACT inhaler Take 2 puffs by mouth 2 (two) times daily. (Patient not taking: Reported on 01/27/2021)  3   No facility-administered medications prior to visit.     Review of Systems:   Constitutional:   No  weight loss, night sweats,  Fevers, chills,  +fatigue, or  lassitude.  HEENT:   No headaches,  Difficulty swallowing,  Tooth/dental problems, or  Sore throat,                No sneezing, itching, ear ache, nasal congestion, post nasal drip,   CV:  No chest pain,  Orthopnea, PND, swelling in lower extremities, anasarca, dizziness, palpitations, syncope.   GI  No  heartburn, indigestion, abdominal pain, nausea, vomiting, diarrhea, change in bowel habits, loss of appetite, bloody stools.   Resp: .  No chest wall deformity  Skin: no rash or lesions.  GU: no dysuria, change in color of urine, no urgency or frequency.  No flank pain, no hematuria   MS:  No joint pain or swelling.  No decreased range of motion.  No back pain.    Physical Exam  BP 130/82 (BP Location: Left Arm, Patient Position: Sitting, Cuff Size: Normal)   Pulse (!) 115   Temp 98.2 F (36.8 C) (Oral)   Ht 5' 7"  (1.702 m)   Wt 180 lb 6.4 oz (81.8 kg)   SpO2 97%   BMI 28.25 kg/m   GEN: A/Ox3; pleasant , NAD, well nourished .  Chronically ill-appearing on oxygen   HEENT:  [place]/AT,  NOSE-clear, THROAT-clear, no lesions, no postnasal drip or exudate noted.   NECK:  Supple w/ fair ROM; no JVD; normal carotid impulses w/o bruits; no thyromegaly or nodules palpated; no lymphadenopathy.    RESP  Clear  P & A; w/o, wheezes/ rales/ or rhonchi. no accessory muscle use, no dullness to percussion  CARD:  RRR, no m/r/g, no peripheral edema, pulses intact, no cyanosis or clubbing.  GI:   Soft & nt; nml bowel sounds; no organomegaly or masses detected.   Musco: Warm bil, no deformities or joint swelling noted.   Neuro: alert, no focal deficits noted.    Skin: Warm, no lesions or rashes    Lab Results:  CBC   BNP No results found for: BNP    Imaging: No results found.    PFT Results Latest Ref Rng & Units 02/03/2018  FVC-Pre L 2.04  FVC-Predicted Pre % 56  FVC-Post L 2.27  FVC-Predicted Post % 63  Pre FEV1/FVC % % 41  Post FEV1/FCV % % 38  FEV1-Pre L 0.84  FEV1-Predicted Pre % 30  FEV1-Post L 0.87  DLCO uncorrected ml/min/mmHg  14.56  DLCO UNC% % 51  DLVA Predicted % 80  TLC L 6.18  TLC % Predicted % 112  RV % Predicted % 168    No results found for: NITRICOXIDE      Assessment & Plan:   COPD (chronic obstructive pulmonary disease) (HCC) Severe COPD  with high symptom burden.  Patient's continue on current regimen.  She does seem to have a slight improvement on Breztri. Influenza vaccine is up-to-date.  Recommend COVID booster.  Activity as tolerated.  Plan  Patient Instructions  Continue on BREZTRI 2 puffs Twice daily  , rinse after use.  Albuterol inhaler As needed  or Duoneb as needed.  Mucinex DM Twice daily  As needed  cough/congest  Continue on Oxygen 3l/m .  Activity as tolerated.  Covid booster as discussed.  Follow up with Oncology as planned.  Follow up with Dr. Lamonte Sakai  in 3 months and As needed           Adenocarcinoma of left lung, stage 4 ([place]) Stage IV lung cancer adenocarcinoma.  Continue follow-up with oncology and serial CT.  Chronic respiratory failure with hypoxia (HCC) Continue on oxygen 3 L.  O2 saturation goal greater than 88 to 90%.     Rexene Edison, NP 01/27/2021

## 2021-01-27 NOTE — Patient Instructions (Addendum)
Continue on BREZTRI 2 puffs Twice daily  , rinse after use.  Albuterol inhaler As needed  or Duoneb as needed.  Mucinex DM Twice daily  As needed  cough/congest  Continue on Oxygen 3l/m .  Activity as tolerated.  Covid booster as discussed.  Follow up with Oncology as planned.  Follow up with Dr. Lamonte Sakai  in 3 months and As needed

## 2021-01-27 NOTE — Assessment & Plan Note (Signed)
Severe COPD with high symptom burden.  Patient's continue on current regimen.  She does seem to have a slight improvement on Breztri. Influenza vaccine is up-to-date.  Recommend COVID booster.  Activity as tolerated.  Plan  Patient Instructions  Continue on BREZTRI 2 puffs Twice daily  , rinse after use.  Albuterol inhaler As needed  or Duoneb as needed.  Mucinex DM Twice daily  As needed  cough/congest  Continue on Oxygen 3l/m .  Activity as tolerated.  Covid booster as discussed.  Follow up with Oncology as planned.  Follow up with Dr. Lamonte Sakai  in 3 months and As needed

## 2021-02-03 ENCOUNTER — Other Ambulatory Visit (HOSPITAL_COMMUNITY): Payer: Self-pay

## 2021-02-03 ENCOUNTER — Telehealth: Payer: Self-pay | Admitting: Pharmacy Technician

## 2021-02-03 ENCOUNTER — Encounter: Payer: Self-pay | Admitting: Internal Medicine

## 2021-02-03 NOTE — Telephone Encounter (Signed)
Patient Advocate Encounter   Received notification from CoverMyMeds  that prior authorization for Generic Duoneb is required by his/her insurance [employer].  Per Test Claim: not covered under part D  Pt has another ins [employer]: $15 under that plan.   Spoke with the pharmacy to process.

## 2021-03-10 ENCOUNTER — Telehealth: Payer: Self-pay | Admitting: Emergency Medicine

## 2021-03-10 NOTE — Telephone Encounter (Signed)
LMTCB

## 2021-03-17 MED ORDER — BREZTRI AEROSPHERE 160-9-4.8 MCG/ACT IN AERO: 2.0000 | INHALATION_SPRAY | Freq: Two times a day (BID) | RESPIRATORY_TRACT | 5 refills | Status: DC

## 2021-03-17 NOTE — Telephone Encounter (Signed)
Inhaler we are showing on pt's med list is Breztri, not Breo.  Attempted to call pt but unable to reach. Left message for her to return call.

## 2021-03-17 NOTE — Telephone Encounter (Signed)
Rx for pt's Breztri inhaler has been sent to preferred pharmacy for pt. Called and spoke with pt letting her know this had been done and she verbalized understanding. Nothing further needed.

## 2021-03-17 NOTE — Telephone Encounter (Signed)
Attempted to call but line went straight to VM. Left message for her to return call.

## 2021-03-26 IMAGING — CT CT RENAL STONE PROTOCOL
2 of 4 series · 16 of 46 positions shown, 18 images · non-contrast
Comparison: CT abdomen and pelvis 05/11/2018.

CLINICAL DATA: Right low back and pelvic pain for 1.5 weeks.
Abnormal urinalysis and elevated white blood cell count. Burning
with urination. History of lung cancer.

EXAM:
CT ABDOMEN AND PELVIS WITHOUT CONTRAST
TECHNIQUE: Multidetector CT imaging of the abdomen and pelvis was performed
following the standard protocol without IV contrast.

[Series 2: axial st · axial · 0.77mm/px · z∈[-427,-52]mm · 13 of 85 slices shown, 15 images]
[im 5/85  soft-tissue]
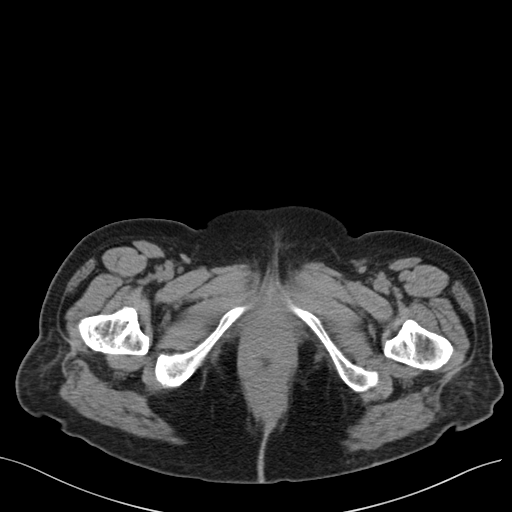
[im 5/85  bone]
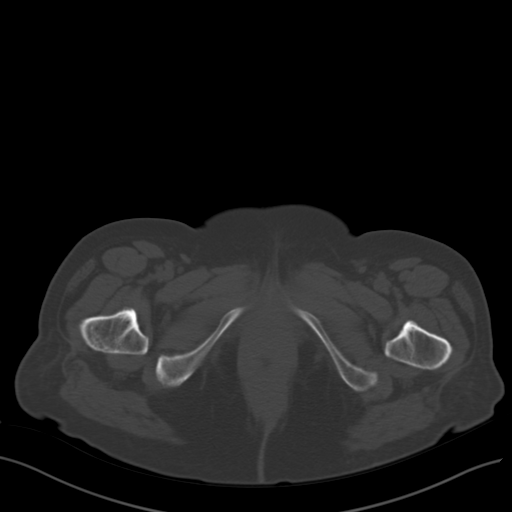
[im 13/85  soft-tissue]
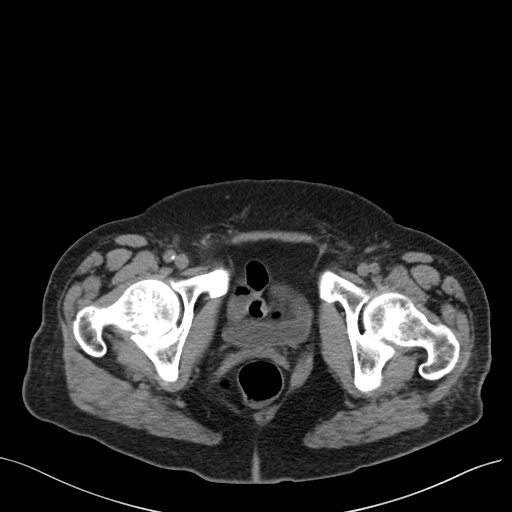
[im 17/85  soft-tissue]
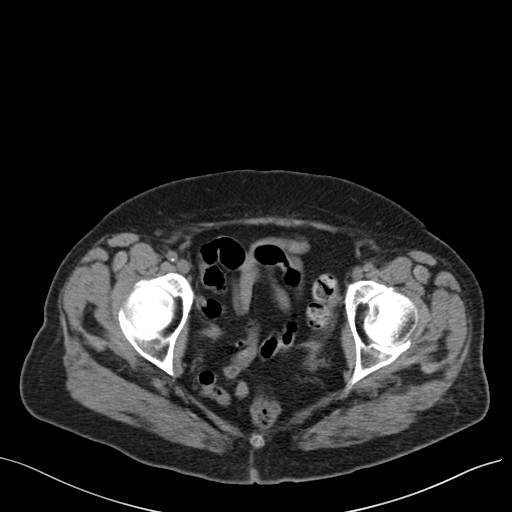
[im 26/85  soft-tissue]
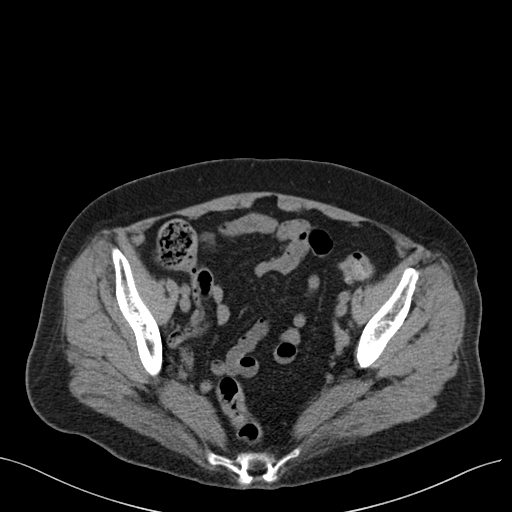
[im 30/85  soft-tissue]
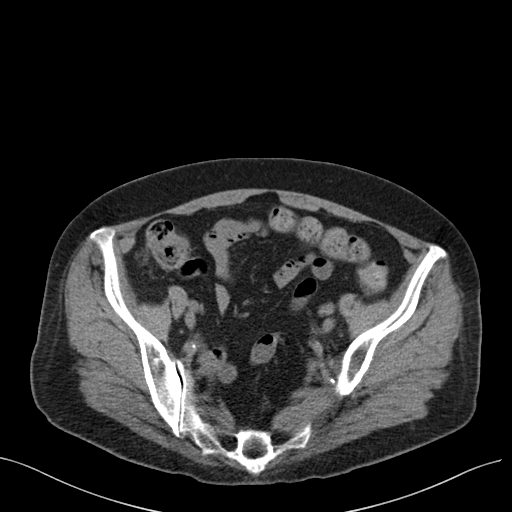
[im 38/85  soft-tissue]
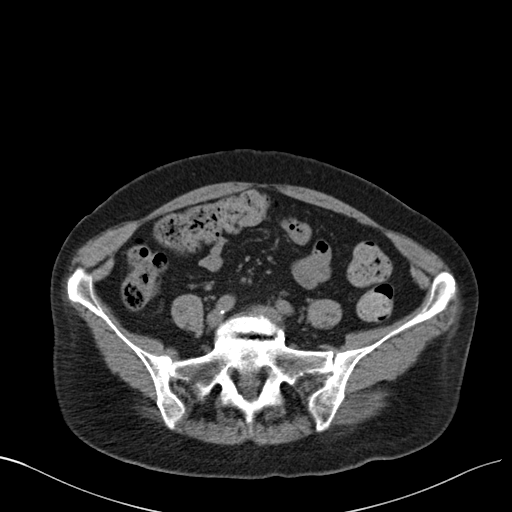
[im 43/85  soft-tissue]
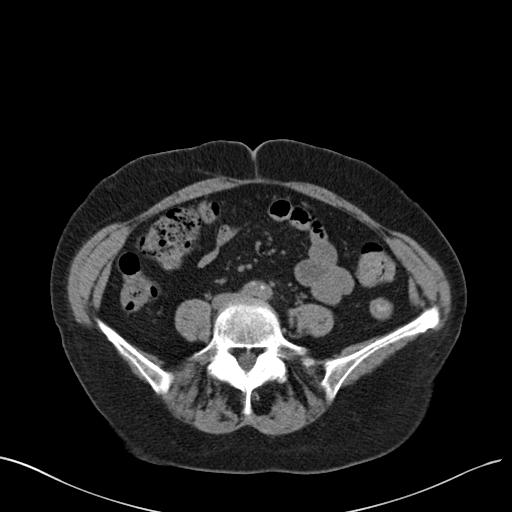
[im 47/85  soft-tissue]
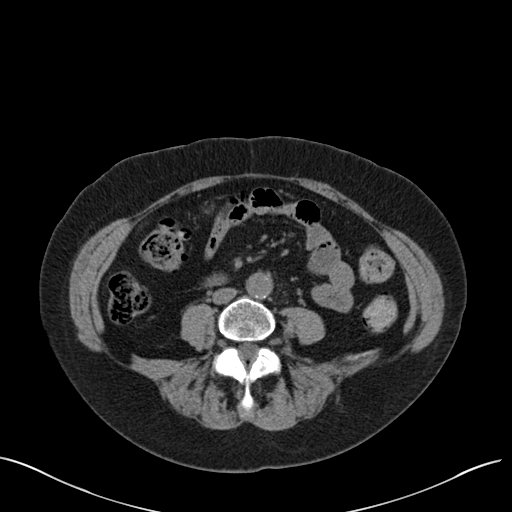
[im 55/85  soft-tissue]
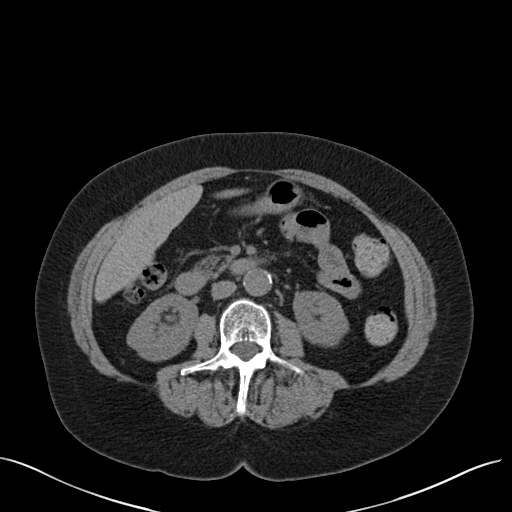
[im 55/85  bone]
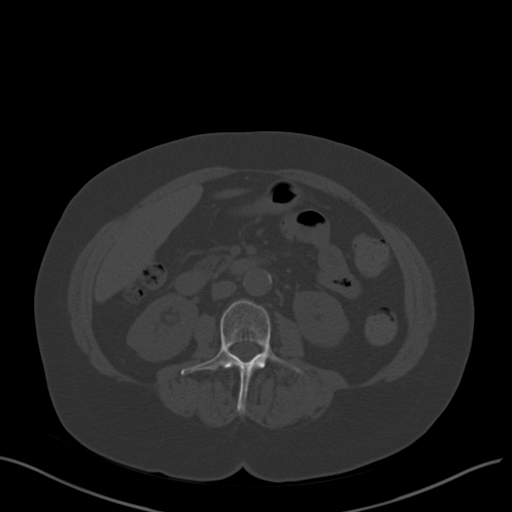
[im 59/85  soft-tissue]
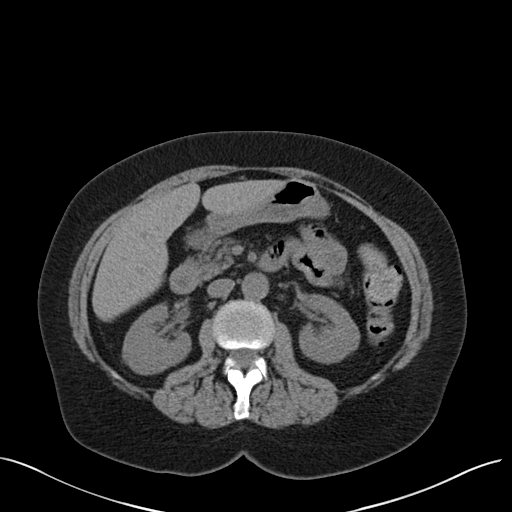
[im 68/85  soft-tissue]
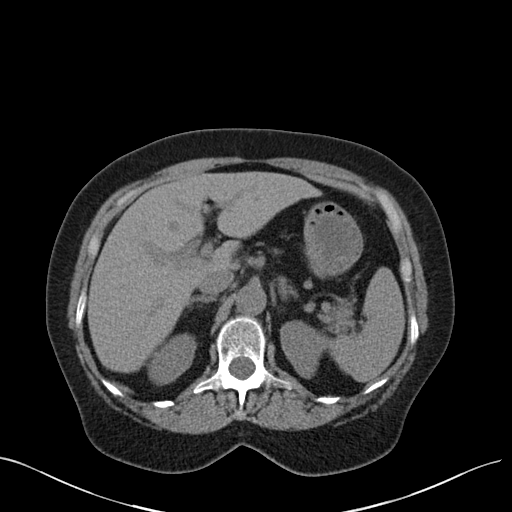
[im 72/85  soft-tissue]
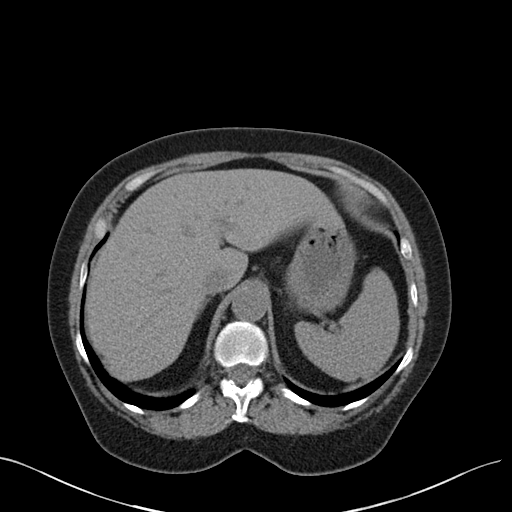
[im 80/85  soft-tissue]
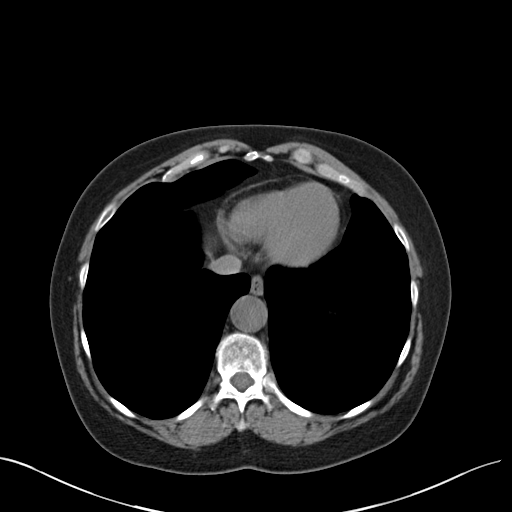

[Series 5: coronal · coronal · 0.62mm/px · 3 of 134 slices shown]
[im 45/134  soft-tissue]
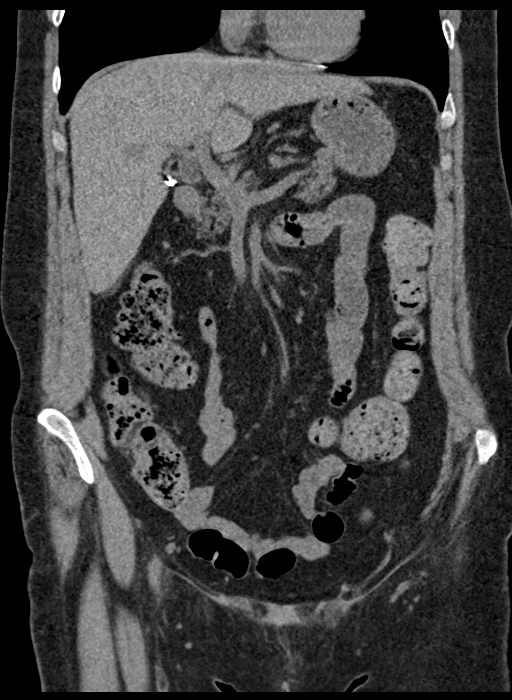
[im 60/134  soft-tissue]
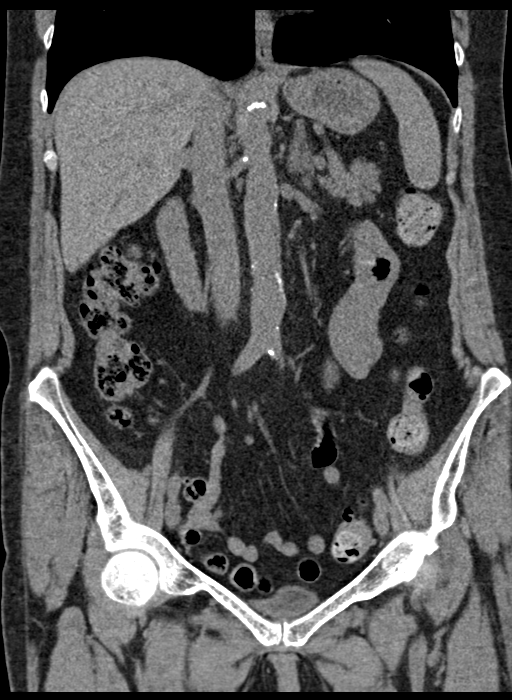
[im 74/134  soft-tissue]
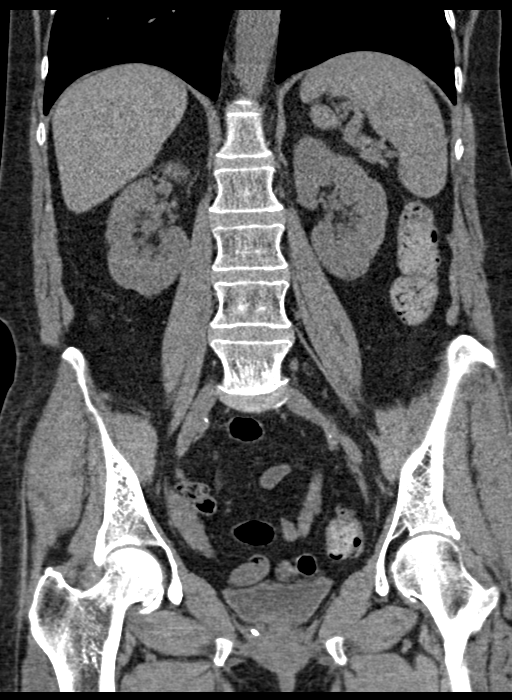

[16 of 46 positions shown; findings below may reference images not displayed]

FINDINGS: Lower chest: Lung bases are clear. No pleural or pericardial
effusion.

Hepatobiliary: No focal liver abnormality is seen. No gallstones,
gallbladder wall thickening, or biliary dilatation.

Pancreas: Unremarkable. No pancreatic ductal dilatation or
surrounding inflammatory changes.

Spleen: Normal in size without focal abnormality.

Adrenals/Urinary Tract: Adrenal glands are unremarkable. Kidneys are
normal, without renal calculi, focal lesion, or hydronephrosis.
Bladder is unremarkable.

Stomach/Bowel: Stomach is within normal limits. Appendix appears
normal. No evidence of bowel wall thickening, distention, or
inflammatory changes.

Vascular/Lymphatic: Aortic atherosclerosis. No enlarged abdominal or
pelvic lymph nodes.

Reproductive: Status post hysterectomy. No adnexal masses.

Other: None.

Musculoskeletal: No focal lesion. No fracture. Degenerative disc
disease lower lumbar spine noted.
IMPRESSION: Negative for urinary tract stone. No acute abnormality abdomen or
pelvis.

Atherosclerosis.

## 2021-04-17 ENCOUNTER — Other Ambulatory Visit: Payer: Self-pay

## 2021-04-17 ENCOUNTER — Encounter: Payer: Self-pay | Admitting: Emergency Medicine

## 2021-04-17 ENCOUNTER — Ambulatory Visit: Payer: Managed Care, Other (non HMO) | Admitting: Emergency Medicine

## 2021-04-17 DIAGNOSIS — J449 Chronic obstructive pulmonary disease, unspecified: Secondary | ICD-10-CM

## 2021-04-17 DIAGNOSIS — J9611 Chronic respiratory failure with hypoxia: Secondary | ICD-10-CM

## 2021-04-17 DIAGNOSIS — C3492 Malignant neoplasm of unspecified part of left bronchus or lung: Secondary | ICD-10-CM

## 2021-04-17 NOTE — Assessment & Plan Note (Signed)
Please continue Breztri 2 puffs twice a day.  Rinse and gargle after using. Keep your albuterol available to use 2 puffs when needed for shortness of breath We will refer you to pulmonary rehab Follow with Dr. Valeta Harms in 6 months or sooner if you have any problems.

## 2021-04-17 NOTE — Progress Notes (Signed)
Subjective:    Patient ID: [name], female    DOB: Jun 29, 1955, 66 y.o.   MRN: [id number]  HPI 66 year old woman with history tobacco use, severe COPD/asthma, CAD, hypertension, IBS, depression, GERD with a hiatal hernia.  She has history of multifocal adenocarcinoma of the lung on observation.  She has been seen by Dr. Julien Nordmann, Dr. Valeta Harms in our office.  Currently managed on Breztri, Mucinex DM.  She uses albuterol 1x a day. She notes that her exertional SOB is worse. She cannot exert, gets tachycardic. Has to stop to rest w any walking. Has a belt-like pain around to her back O2 at 3L/min She has not done pulm rehab Repeat CT and Onc follow up - she believes next month.    Review of Systems As per HPI  Past Medical History:  Diagnosis Date   Anemia    "long time ago" (12/06/2012)   Anxiety    Arthritis    "right knee real bad; in my hands bad" (12/06/2012)   Asthma    Celiac disease    Colon polyps    COPD (chronic obstructive pulmonary disease) ([place])    Coronary artery disease    a. s/p Xience DES to [facility] 04/2008;  b. LHC 05/2008: Proximal RCA 25%, mid RCA stent patent.  c. Anormal nuc 2014 -> s/p LHC with severe mRCA stenosis s/p DES. d. Cath 04/2013 s/p DES to LAD.  e. LHC 08/2015 was stable.   Depression    "years ago" (12/06/2012)   Fibromyalgia    Gallstones    GERD (gastroesophageal reflux disease)    H/O hiatal hernia    Hepatitis    "not A, B, or C" (12/06/2012)   Hyperlipidemia    intol to statins and other chol agents due to elevated LFTs   Hypertension    Hypothyroidism    IBS (irritable bowel syndrome)    Interstitial cystitis    Lung cancer ([place]) dx'd 01/2018   Lung nodules    Bilateral   Migraines    "when I was younger" (12/06/2012)   Pneumonia    PONV (postoperative nausea and vomiting)    "Very bad"   Premature atrial contractions    PVC's (premature ventricular contractions)    Sinus headache    SVT (supraventricular tachycardia) ([place])       Family History  Problem Relation Age of Onset   Colon cancer Father 67   Lung disease Father    Emphysema Mother    Drug abuse Brother    Hepatitis C Brother    Drug abuse Brother    Celiac disease Son    Celiac disease Grandson    Down syndrome Grandson    Diabetes Granddaughter    CAD Neg Hx      Social History   Socioeconomic History   Marital status: Married    Spouse name: Laverna Peace   Number of children: 4   Years of education: Not on file   Highest education level: Bachelor's degree (e.g., BA, AB, BS)  Occupational History    Comment: owned a day care  Tobacco Use   Smoking status: Former    Packs/day: 0.50    Years: 30.00    Pack years: 15.00    Types: Cigarettes    Quit date: 2009    Years since quitting: 14.1   Smokeless tobacco: Never   Tobacco comments:    12/06/2012 "quit smoking cigarettes ~ 8 yr ago"  Vaping Use   Vaping Use: Never  used  Substance and Sexual Activity   Alcohol use: No   Drug use: No   Sexual activity: Yes  Other Topics Concern   Not on file  Social History Narrative   Lives with husband   Social Determinants of Health   Financial Resource Strain: Not on file  Food Insecurity: Not on file  Transportation Needs: Not on file  Physical Activity: Not on file  Stress: Not on file  Social Connections: Not on file  Intimate Partner Violence: Not on file     Allergies  Allergen Reactions   Ciprofloxacin Hives   Levofloxacin     UNSPECIFIED REACTION, BUT LIKELY HIVES > HIVES WITH CIPRO   Statins Other (See Comments)    Liver enzymes Liver enzymes   Tricor [Fenofibrate] Hives and Itching   Compazine [Prochlorperazine]     Hx of tardive dyskinesia with compazine   Latex Other (See Comments)    Patient states "Messes with my skin."   Codeine Nausea And Vomiting   Metoprolol Other (See Comments)    Does not tolerate beta blockers 10/02/2013-pt states she can tolerate 25 mg and lower      Outpatient Medications Prior to Visit   Medication Sig Dispense Refill   albuterol (PROVENTIL) (5 MG/ML) 0.5% nebulizer solution Take 2.5 mg by nebulization every 6 (six) hours as needed for wheezing.     albuterol (VENTOLIN HFA) 108 (90 Base) MCG/ACT inhaler Inhale 1 puff into the lungs every 6 (six) hours as needed (wheezing/shortness of breath.).      aspirin 81 MG chewable tablet Chew 1 tablet (81 mg total) by mouth daily.     BELSOMRA 20 MG TABS Take 20 mg by mouth at bedtime as needed for sleep.     Budeson-Glycopyrrol-Formoterol (BREZTRI AEROSPHERE) 160-9-4.8 MCG/ACT AERO Inhale 2 puffs into the lungs in the morning and at bedtime. 10.7 g 5   buPROPion (WELLBUTRIN SR) 150 MG 12 hr tablet Take 150 mg by mouth daily.      DULoxetine (CYMBALTA) 60 MG capsule Take 120 mg by mouth daily.     ipratropium-albuterol (DUONEB) 0.5-2.5 (3) MG/3ML SOLN Inhale 3 mLs into the lungs every 6 (six) hours as needed (Asthma). 360 mL 3   LORazepam (ATIVAN) 2 MG tablet Take 2 mg by mouth every evening.     metoprolol tartrate (LOPRESSOR) 25 MG tablet TAKE 1/2 TABLET(12.5 MG) BY MOUTH TWICE DAILY 90 tablet 2   montelukast (SINGULAIR) 10 MG tablet Take 10 mg by mouth at bedtime.     nitroGLYCERIN (NITROSTAT) 0.4 MG SL tablet Place 1 tablet (0.4 mg total) under the tongue every 5 (five) minutes as needed for chest pain. 25 tablet 12   ALPRAZolam (XANAX) 0.5 MG tablet for sedation before MRI scan; take 1 tab 1 hour before scan; may repeat 1 tab 15 min before scan (Patient not taking: Reported on 04/17/2021) 3 tablet 0   HYDROcodone-acetaminophen (NORCO) 7.5-325 MG per tablet Take 1 tablet by mouth every 6 (six) hours as needed for pain. (Patient not taking: Reported on 04/17/2021)     Vitamin D, Ergocalciferol, (DRISDOL) 1.25 MG (50000 UT) CAPS capsule Take 50,000 Units by mouth every 7 (seven) days. Wednesdays (Patient not taking: Reported on 04/17/2021)     No facility-administered medications prior to visit.        Objective:   Physical  Exam  Vitals:   04/17/21 1612  BP: 138/82  Pulse: (!) 127  Temp: 97.8 F (36.6 C)  TempSrc: Oral  SpO2: 98%  Weight: 179 lb 6.4 oz (81.4 kg)  Height: 5' 7"  (1.702 m)   Gen: Pleasant, well-nourished, in no distress,  normal affect  ENT: No lesions,  mouth clear,  oropharynx clear, no postnasal drip  Neck: No JVD, no stridor  Lungs: No use of accessory muscles, no crackles.  Bilateral end expiratory wheezes  Cardiovascular: RRR, heart sounds normal, no murmur or gallops, no peripheral edema  Musculoskeletal: No deformities, no cyanosis or clubbing  Neuro: alert, awake, non focal  Skin: Warm, no lesions or rash      Assessment & Plan:  COPD (chronic obstructive pulmonary disease) (HCC) Please continue Breztri 2 puffs twice a day.  Rinse and gargle after using. Keep your albuterol available to use 2 puffs when needed for shortness of breath We will refer you to pulmonary rehab Follow with Dr. Valeta Harms in 6 months or sooner if you have any problems.  Chronic respiratory failure with hypoxia (HCC) Continue oxygen at 3 L/min  Adenocarcinoma of left lung, stage 4 (HCC) Following with Dr. Julien Nordmann.  Her imaging was stable on most recent CT.  Follow-up imaging in plans per oncology  Baltazar Apo, MD, PhD 04/17/2021, 4:28 PM [facility] [phone] or if no answer before 7:00PM call [phone] For any issues after 7:00PM please call eLink [phone]

## 2021-04-17 NOTE — Assessment & Plan Note (Signed)
Continue oxygen at 3 L/min

## 2021-04-17 NOTE — Patient Instructions (Addendum)
Please continue Breztri 2 puffs twice a day.  Rinse and gargle after using. Keep your albuterol available to use 2 puffs when needed for shortness of breath Continue oxygen at 3 L/min We will refer you to pulmonary rehab Follow with Dr. Julien Nordmann and have your repeat CT scan of the chest as planned. Follow with Dr. Valeta Harms in 6 months or sooner if you have any problems.

## 2021-04-17 NOTE — Assessment & Plan Note (Signed)
Following with Dr. Julien Nordmann.  Her imaging was stable on most recent CT.  Follow-up imaging in plans per oncology

## 2021-04-22 ENCOUNTER — Telehealth (HOSPITAL_COMMUNITY): Payer: Self-pay

## 2021-04-22 ENCOUNTER — Encounter (HOSPITAL_COMMUNITY): Payer: Self-pay

## 2021-04-22 NOTE — Telephone Encounter (Signed)
Attempted to call patient in regards to Pulmonary Rehab - Unable to leave message VM box is full. Mailed letter

## 2021-05-12 NOTE — Telephone Encounter (Signed)
No response from pt.  Closed referral  

## 2021-05-15 ENCOUNTER — Telehealth: Payer: Self-pay

## 2021-05-15 NOTE — Telephone Encounter (Signed)
Pt LM stating the PA was not auth for her CT at [facility]. I spoke with pt and she states its approved from [facility] and they are working to get her setup for an appt there. ?

## 2021-05-16 ENCOUNTER — Ambulatory Visit (HOSPITAL_COMMUNITY): Payer: Managed Care, Other (non HMO)

## 2021-05-16 ENCOUNTER — Encounter (HOSPITAL_COMMUNITY): Payer: Self-pay

## 2021-05-16 ENCOUNTER — Other Ambulatory Visit: Payer: Managed Care, Other (non HMO)

## 2021-05-16 ENCOUNTER — Inpatient Hospital Stay: Payer: Managed Care, Other (non HMO)

## 2021-05-19 ENCOUNTER — Ambulatory Visit: Payer: Managed Care, Other (non HMO) | Admitting: Internal Medicine

## 2021-05-19 ENCOUNTER — Other Ambulatory Visit: Payer: Self-pay

## 2021-05-19 DIAGNOSIS — C3492 Malignant neoplasm of unspecified part of left bronchus or lung: Secondary | ICD-10-CM

## 2021-05-20 ENCOUNTER — Telehealth: Payer: Self-pay

## 2021-05-20 NOTE — Telephone Encounter (Signed)
I have left a detailed message for the pt advising her PA for the CT has been approved and she can move forward with scheduling her appt with [facility]. ?

## 2021-05-20 NOTE — Telephone Encounter (Signed)
-----   Message from Epifanio Lesches sent at 05/20/2021  7:18 AM EDT ----- ?Regarding: RE: CT PA - Location issue ?Pts scan has been approved. Ok to schedule with GI ?----- Message ----- ?From: Gaspar Bidding ?Sent: 05/19/2021  11:22 AM EDT ?To: Claretha Cooper, CMA, [facility] [employer] ?Subject: RE: CT PA - Location issue                    ? ?Hi [name], ? ?Message sent via POD3 to see if pt is willing to switch locations. ? ?PA team will contact payor to update facility to GI. Once updated the team will keep you posted so you can proceed with scheduling. ? ?Best, ?Darlena ?----- Message ----- ?From: Claretha Cooper, CMA ?Sent: 05/19/2021  10:48 AM EDT ?To: [facility] [employer] ?Subject: CT PA - Location issue                        ? ?Good Morning, ? ?I saw that pts CT was pending auth. Per the pt it should be approved for [facility]. Can you please confirm this and advise if the pt has been contacted to schedule herself there? ? ?Thank you ? ? ? ?

## 2021-05-26 ENCOUNTER — Ambulatory Visit: Payer: Managed Care, Other (non HMO) | Admitting: Cardiovascular Disease

## 2021-05-30 ENCOUNTER — Telehealth: Payer: Self-pay | Admitting: Internal Medicine

## 2021-05-30 NOTE — Telephone Encounter (Signed)
.  Called pt per 3/31 inbasket , Patient was unavailable, a message with appt time and date was left with number on file.    ?

## 2021-06-03 ENCOUNTER — Telehealth: Payer: Self-pay | Admitting: Internal Medicine

## 2021-06-03 NOTE — Telephone Encounter (Signed)
Called patient regarding upcoming appointments, left patient central radiology's number and our number to call back. ?

## 2021-06-13 ENCOUNTER — Telehealth: Payer: Self-pay | Admitting: Internal Medicine

## 2021-06-13 NOTE — Telephone Encounter (Signed)
.  Called pt per 4.13 vmail , Patient was unavailable, a message with appt time and date was left with number on file.  ? ?

## 2021-06-16 ENCOUNTER — Other Ambulatory Visit: Payer: Managed Care, Other (non HMO)

## 2021-06-19 ENCOUNTER — Ambulatory Visit: Payer: Managed Care, Other (non HMO) | Admitting: Internal Medicine

## 2021-06-24 ENCOUNTER — Ambulatory Visit
Admission: RE | Admit: 2021-06-24 | Discharge: 2021-06-24 | Disposition: A | Discharge status: Home / Self Care | Payer: Managed Care, Other (non HMO) | Source: Ambulatory Visit | Attending: Internal Medicine | Admitting: Internal Medicine

## 2021-06-24 ENCOUNTER — Encounter: Payer: Self-pay | Admitting: Internal Medicine

## 2021-06-24 DIAGNOSIS — C3490 Malignant neoplasm of unspecified part of unspecified bronchus or lung: Secondary | ICD-10-CM

## 2021-06-24 MED ORDER — IOPAMIDOL (ISOVUE-300) INJECTION 61%
60.0000 mL | Freq: Once | INTRAVENOUS | Status: AC | PRN
  Administered 2021-06-24: 60 mL via INTRAVENOUS

## 2021-06-26 NOTE — Progress Notes (Deleted)
[facility] OFFICE PROGRESS NOTE  [name], NP [place] [address]  DIAGNOSIS: Stage IV (T1a, N0, M1 a) non-small cell lung cancer, adenocarcinoma presented with multifocal disease in both lungs including 3 nodules in the left lung as well as 2 nodules in the right lung diagnosed in December 2019.     Molecular studies showed: KRAS G12C BCOR N1425S-subclonal RAD21 amplification   PDL 1 expression 1%.  PRIOR THERAPY: 1) Systemic chemotherapy with carboplatin for AUC of 5, Alimta 500 mg/M2 and Keytruda 200 mg IV every 3 weeks.  First dose April 12, 2018. Status post 4 cycles. starting from cycle #3, Keytruda was dropped and patient received treatment with Carboplatin for an AUC of 5 and Alimta 500 mg/m2 IV every 3 weeks.  2) Systemic chemotherapy with Alimta 500 mg/m2 IV every 3 weeks. Status post 3 cycles of single agent Alimta. Most recent dose given on 08/23/2018.  The treatment is currently on hold secondary to renal insufficiency.  CURRENT THERAPY: Observation.  INTERVAL HISTORY: [name] 66 y.o. female returns to clinic today for 78-monthfollow-up visit.  She was last seen by Dr. MJulien Nordmannon 11/18/2021.  The patient is feeling fairly well today without any concerning complaints except for chronic shortness of breath with exertion.  The patient is on 3 L of supplemental oxygen.  The patient follows closely with pulmonology.  She last saw Dr. BLamonte Sakailast month.  She is on Breztri and Mucinex DM.  The patient gets tachycardic with minimal exertion.  Dr. BLamonte Sakaireferred the patient to pulmonary rehab; however, it looks like they were unable to get in touch with the patient and they have closed the referral.  Otherwise the patient denies any new concerning complaints.  She denies any fever, chills, or night sweats.  She denies any unexplained weight loss.  Denies any chest pain, cough, or hemoptysis.  Denies any nausea, vomiting, diarrhea, or  constipation.  Denies any headache or visual changes.  The patient recently had a restaging CT scan performed.  She is here today for evaluation to review her scan results.  MEDICAL HISTORY: Past Medical History:  Diagnosis Date   Anemia    "long time ago" (12/06/2012)   Anxiety    Arthritis    "right knee real bad; in my hands bad" (12/06/2012)   Asthma    Celiac disease    Colon polyps    COPD (chronic obstructive pulmonary disease) (HCC)    Coronary artery disease    [facility];  b. LHC 05/2008: Proximal RCA 25%, mid RCA stent patent.  c. Anormal nuc 2014 -> s/p LHC with severe mRCA stenosis s/p DES. d. Cath 04/2013 s/p DES to LAD.  e. LHC 08/2015 was stable.   Depression    "years ago" (12/06/2012)   Fibromyalgia    Gallstones    GERD (gastroesophageal reflux disease)    H/O hiatal hernia    Hepatitis    "not A, B, or C" (12/06/2012)   Hyperlipidemia    intol to statins and other chol agents due to elevated LFTs   Hypertension    Hypothyroidism    IBS (irritable bowel syndrome)    Interstitial cystitis    [place]ung [place]   Lung nodules    Bilateral   Migraines    "when I was younger" (12/06/2012)   Pneumonia    PONV (postoperative nausea and vomiting)    "Very bad"  Premature atrial contractions    PVC's (premature ventricular contractions)    Sinus headache    SVT (supraventricular tachycardia) (HCC)     ALLERGIES:  is allergic to ciprofloxacin, levofloxacin, statins, tricor [fenofibrate], compazine [prochlorperazine], latex, codeine, and metoprolol.  MEDICATIONS:  Current Outpatient Medications  Medication Sig Dispense Refill   albuterol (PROVENTIL) (5 MG/ML) 0.5% nebulizer solution Take 2.5 mg by nebulization every 6 (six) hours as needed for wheezing.     albuterol (VENTOLIN HFA) 108 (90 Base) MCG/ACT inhaler Inhale 1 puff into the lungs every 6 (six) hours as needed (wheezing/shortness of breath.).      ALPRAZolam (XANAX) 0.5  MG tablet for sedation before MRI scan; take 1 tab 1 hour before scan; may repeat 1 tab 15 min before scan (Patient not taking: Reported on 04/17/2021) 3 tablet 0   aspirin 81 MG chewable tablet Chew 1 tablet (81 mg total) by mouth daily.     BELSOMRA 20 MG TABS Take 20 mg by mouth at bedtime as needed for sleep.     Budeson-Glycopyrrol-Formoterol (BREZTRI AEROSPHERE) 160-9-4.8 MCG/ACT AERO Inhale 2 puffs into the lungs in the morning and at bedtime. 10.7 g 5   buPROPion (WELLBUTRIN SR) 150 MG 12 hr tablet Take 150 mg by mouth daily.      DULoxetine (CYMBALTA) 60 MG capsule Take 120 mg by mouth daily.     HYDROcodone-acetaminophen (NORCO) 7.5-325 MG per tablet Take 1 tablet by mouth every 6 (six) hours as needed for pain. (Patient not taking: Reported on 04/17/2021)     ipratropium-albuterol (DUONEB) 0.5-2.5 (3) MG/3ML SOLN Inhale 3 mLs into the lungs every 6 (six) hours as needed (Asthma). 360 mL 3   LORazepam (ATIVAN) 2 MG tablet Take 2 mg by mouth every evening.     metoprolol tartrate (LOPRESSOR) 25 MG tablet TAKE 1/2 TABLET(12.5 MG) BY MOUTH TWICE DAILY 90 tablet 2   montelukast (SINGULAIR) 10 MG tablet Take 10 mg by mouth at bedtime.     nitroGLYCERIN (NITROSTAT) 0.4 MG SL tablet Place 1 tablet (0.4 mg total) under the tongue every 5 (five) minutes as needed for chest pain. 25 tablet 12   Vitamin D, Ergocalciferol, (DRISDOL) 1.25 MG (50000 UT) CAPS capsule Take 50,000 Units by mouth every 7 (seven) days. Wednesdays (Patient not taking: Reported on 04/17/2021)     No current facility-administered medications for this visit.    SURGICAL HISTORY:  Past Surgical History:  Procedure Laterality Date   CARDIAC CATHETERIZATION  04/2008; 05/2008   CARDIAC CATHETERIZATION N/A 08/07/2015   Procedure: Left Heart Cath and Coronary Angiography;  Surgeon: Sherren Mocha, MD;  Location: [facility];  Service: Cardiovascular;  Laterality: N/A;   CHOLECYSTECTOMY     CORONARY ANGIOPLASTY WITH STENT  PLACEMENT  04/2008 12/06/2012   "1 + 1" (12/06/2012)   CORONARY STENT PLACEMENT  05/08/2013   DES TO LAD      DR MCALHANY   IR IMAGING GUIDED PORT INSERTION  04/29/2018   LEFT HEART CATHETERIZATION WITH CORONARY ANGIOGRAM N/A 12/06/2012   Procedure: LEFT HEART CATHETERIZATION WITH CORONARY ANGIOGRAM;  Surgeon: [name], MD;  Location: [facility] CATH LAB;  Service: Cardiovascular;  Laterality: N/A;   LEFT HEART CATHETERIZATION WITH CORONARY ANGIOGRAM N/A 05/08/2013   Procedure: LEFT HEART CATHETERIZATION WITH CORONARY ANGIOGRAM;  Surgeon: [name], MD;  Location: [facility] CATH LAB;  Service: Cardiovascular;  Laterality: N/A;   LEFT HEART CATHETERIZATION WITH CORONARY ANGIOGRAM N/A 01/04/2014   Procedure: LEFT HEART CATHETERIZATION WITH CORONARY  ANGIOGRAM;  Surgeon: [name], MD;  Location: [facility] CATH LAB;  Service: Cardiovascular;  Laterality: N/A;   PERCUTANEOUS CORONARY STENT INTERVENTION (PCI-S)  12/06/2012   Procedure: PERCUTANEOUS CORONARY STENT INTERVENTION (PCI-S);  Surgeon: [name], MD;  Location: [facility] CATH LAB;  Service: Cardiovascular;;   PERCUTANEOUS CORONARY STENT INTERVENTION (PCI-S)  05/08/2013   Procedure: PERCUTANEOUS CORONARY STENT INTERVENTION (PCI-S);  Surgeon: [name], MD;  Location: [facility] C[facility] LAB;  Service: Cardiovascular;;  mid LAD    TUBAL LIGATION     VAGINAL HYSTERECTOMY     VIDEO BRONCHOSCOPY WITH ENDOBRONCHIAL NAVIGATION N/A 02/17/2018   Procedure: VIDEO BRONCHOSCOPY WITH ENDOBRONCHIAL NAVIGATION;  Surgeon: Melrose Nakayama, MD;  Location: [facility];  Service: Thoracic;  Laterality: N/A;    REVIEW OF SYSTEMS:   Review of Systems  Constitutional: Negative for appetite change, chills, fatigue, fever and unexpected weight change.  HENT:   Negative for mouth sores, nosebleeds, sore throat and trouble swallowing.   Eyes: Negative for eye problems and icterus.  Respiratory: Negative for cough, hemoptysis, shortness of breath and  wheezing.   Cardiovascular: Negative for chest pain and leg swelling.  Gastrointestinal: Negative for abdominal pain, constipation, diarrhea, nausea and vomiting.  Genitourinary: Negative for bladder incontinence, difficulty urinating, dysuria, frequency and hematuria.   Musculoskeletal: Negative for back pain, gait problem, neck pain and neck stiffness.  Skin: Negative for itching and rash.  Neurological: Negative for dizziness, extremity weakness, gait problem, headaches, light-headedness and seizures.  Hematological: Negative for adenopathy. Does not bruise/bleed easily.  Psychiatric/Behavioral: Negative for confusion, depression and sleep disturbance. The patient is not nervous/anxious.     PHYSICAL EXAMINATION:  There were no vitals taken for this visit.  ECOG PERFORMANCE STATUS: {CHL ONC ECOG [id number]  Physical Exam  Constitutional: Oriented to person, place, and time and well-developed, well-nourished, and in no distress. No distress.  HENT:  Head: Normocephalic and atraumatic.  Mouth/Throat: Oropharynx is clear and moist. No oropharyngeal exudate.  Eyes: Conjunctivae are normal. Right eye exhibits no discharge. Left eye exhibits no discharge. No scleral icterus.  Neck: Normal range of motion. Neck supple.  Cardiovascular: Normal rate, regular rhythm, normal heart sounds and intact distal pulses.   Pulmonary/Chest: Effort normal and breath sounds normal. No respiratory distress. No wheezes. No rales.  Abdominal: Soft. Bowel sounds are normal. Exhibits no distension and no mass. There is no tenderness.  Musculoskeletal: Normal range of motion. Exhibits no edema.  Lymphadenopathy:    No cervical adenopathy.  Neurological: Alert and oriented to person, place, and time. Exhibits normal muscle tone. Gait normal. Coordination normal.  Skin: Skin is warm and dry. No rash noted. Not diaphoretic. No erythema. No pallor.  Psychiatric: Mood, memory and judgment normal.  Vitals  reviewed.  LABORATORY DATA: Lab Results  Component Value Date   WBC 6.8 11/08/2020   HGB 11.7 (L) 11/08/2020   HCT 35.0 (L) 11/08/2020   MCV 92.8 11/08/2020   PLT 190 11/08/2020      Chemistry      Component Value Date/Time   NA 143 11/08/2020 1131   NA 143 01/10/2018 1629   K 4.5 11/08/2020 1131   CL 110 11/08/2020 1131   CO2 24 11/08/2020 1131   BUN 20 11/08/2020 1131   BUN 14 01/10/2018 1629   CREATININE 1.19 (H) 11/08/2020 1131   CREATININE 0.68 08/05/2015 1456      Component Value Date/Time   CALCIUM 9.3 11/08/2020 1131   ALKPHOS 95 11/08/2020 1131   AST  19 11/08/2020 1131   ALT 26 11/08/2020 1131   BILITOT 0.4 11/08/2020 1131       RADIOGRAPHIC STUDIES:  CT Chest W Contrast  Result Date: 06/25/2021 CLINICAL DATA:  Primary Cancer Type: Lung Imaging Indication: Routine surveillance Interval therapy since last imaging? No Initial Cancer Diagnosis Date: 02/17/2018; Established by: Biopsy-proven Detailed Pathology: Stage IV non-small cell lung cancer, adenocarcinoma. Primary Tumor location:  Right and left upper lobe nodules. Surgeries: Coronary stent. Cholecystectomy. Hysterectomy. Chemotherapy: Yes; Ongoing? No; Most recent administration: 08/23/2018 Immunotherapy?  Yes; Type: Keytruda; Ongoing? No Radiation therapy? No EXAM: CT CHEST WITH CONTRAST TECHNIQUE: Multidetector CT imaging of the chest was performed during intravenous contrast administration. RADIATION DOSE REDUCTION: This exam was performed according to the departmental dose-optimization program which includes automated exposure control, adjustment of the mA and/or kV according to patient size and/or use of iterative reconstruction technique. CONTRAST:  30m ISOVUE-300 IOPAMIDOL (ISOVUE-300) INJECTION 61% COMPARISON:  CT chest 11/08/2020, 05/07/2020. FINDINGS: Cardiovascular: Right IJ Port-A-Cath terminates in the low SVC. Atherosclerotic calcification of the aorta, aortic valve and coronary arteries. Heart size  normal. No pericardial effusion. Mediastinum/Nodes: No pathologically enlarged mediastinal, hilar or axillary lymph nodes. Esophagus is grossly unremarkable. Lungs/Pleura: Biapical pleuroparenchymal scarring. Centrilobular emphysema. Part solid nodule in posterior segment right upper lobe has enlarged, now overall measuring 1.3 x 1.8 cm with an internal solid component measuring 7 mm (5/46), previously 1.0 x 1.6 cm and 6 mm, respectively on 11/08/2020. Part solid nodule in the superior segment left lower lobe overall measures 1.2 x 1.4 cm with an internal 5 mm solid component (5/38), overall stable from 11/08/2020. Part solid mass in the adjacent superior segment left lower lobe overall measures 2.4 x 3.2 cm with an internal solid component measuring approximately 6 x 11 mm (5/49), grossly similar to prior exams. Additional scattered relatively amorphous ground-glass nodular densities bilaterally, without solid components, similar to the prior exam. No pleural fluid. Debris is seen in right lower lobe bronchi. Airway is otherwise unremarkable. Upper Abdomen: Liver is decreased in attenuation diffusely. Cholecystectomy with similar chronic biliary ductal dilatation. Visualized portions of the adrenal glands, kidneys, spleen, pancreas, stomach and bowel are otherwise unremarkable. Accessory left renal artery to the upper pole. Musculoskeletal: Degenerative changes in the spine. Midthoracic compression fracture, unchanged. No worrisome lytic or sclerotic lesions. IMPRESSION: 1. Part solid nodule in the right upper lobe is increased slightly in size from 11/08/2020 and is highly worrisome for adenocarcinoma. 2. Additional subsolid and ground-glass nodules/masses bilaterally, grossly stable and worrisome for indolent adenocarcinoma. 3. Hepatic steatosis. 4. Aortic atherosclerosis (ICD10-I70.0). Coronary artery calcification. Emphysema (ICD10-J43.9). Electronically Signed   By: MLorin PicketM.D.   On: 06/25/2021 10:41      ASSESSMENT/PLAN:  This is a very pleasant 66year old Caucasian female diagnosed with stage IV non-small cell lung cancer, adenocarcinoma.  She is negative for any actionable mutations and her PD-L1 expression is 1%.  She was diagnosed in December 2019.  She presented with bilateral pulmonary nodules.  She initially started treatment with carboplatin, Alimta, and Keytruda.  She was status post 4 cycles but Keytruda was discontinued after cycle #2 due to his significant skin rash.  She then was treated with maintenance single agent Alimta for 3 cycles.  This has been on hold since June 2020 due to worsening renal insufficiency.  She has been on observation since that time and feeling fairly well except she does have significant COPD for which she is on supplemental oxygen.  She follows closely with  pulmonology.  She recently saw Dr. Lamonte Sakai in March 2023 who recommended pulmonary rehab; however, they were unable to reach the patient and this referral was closed.  The patient recently had a restaging CT scan.  Dr. Julien Nordmann personally and independently reviewed the scan discussed results with the patient today.  The patient has slightly enlarging groundglass opacity suspicious for a slow-growing/indolent adenocarcinoma.  Given the slow growth Dr. Julien Nordmann gave the patient the option of continued observation versus ***.   The patient would like to ***.  I will arrange for restaging CT scan in ***  The patient was advised to call immediately if she has any concerning symptoms in the interval. The patient voices understanding of current disease status and treatment options and is in agreement with the current care plan. All questions were answered. The patient knows to call the clinic with any problems, questions or concerns. We can certainly see the patient much sooner if necessary      No orders of the defined types were placed in this encounter.    I spent {CHL ONC TIME VISIT - YTRZN:[id number]}  counseling the patient face to face. The total time spent in the appointment was {CHL ONC TIME VISIT - UDTHY:[id number]}.  Nichola Warren L Riot Barrick, PA-C 06/26/21

## 2021-06-27 ENCOUNTER — Encounter: Payer: Self-pay | Admitting: Internal Medicine

## 2021-06-30 ENCOUNTER — Inpatient Hospital Stay: Payer: Managed Care, Other (non HMO) | Attending: Internal Medicine

## 2021-06-30 ENCOUNTER — Telehealth: Payer: Self-pay | Admitting: Physician Assistant

## 2021-06-30 ENCOUNTER — Inpatient Hospital Stay: Payer: Managed Care, Other (non HMO) | Admitting: Physician Assistant

## 2021-06-30 DIAGNOSIS — I10 Essential (primary) hypertension: Secondary | ICD-10-CM | POA: Insufficient documentation

## 2021-06-30 DIAGNOSIS — C3491 Malignant neoplasm of unspecified part of right bronchus or lung: Secondary | ICD-10-CM | POA: Insufficient documentation

## 2021-06-30 DIAGNOSIS — C3492 Malignant neoplasm of unspecified part of left bronchus or lung: Secondary | ICD-10-CM | POA: Insufficient documentation

## 2021-06-30 NOTE — Telephone Encounter (Signed)
She did not show up for her appointment today. I called her but was unable to reach her. I have sent a scheduling message to reschedule her next week. I also left our call back number to reschedule as well ?

## 2021-07-01 ENCOUNTER — Telehealth: Payer: Self-pay | Admitting: Physician Assistant

## 2021-07-01 NOTE — Progress Notes (Signed)
[name] ?OFFICE PROGRESS NOTE ? ?Diana Beals, NP ?[address] ?[place] [address] ? ?DIAGNOSIS:  Stage IV (T1a, N0, M1 a) non-small cell lung cancer, adenocarcinoma presented with multifocal disease in both lungs including 3 nodules in the left lung as well as 2 nodules in the right lung diagnosed in December 2019.   ?  ?Molecular studies showed: ?KRAS G12C ?BCOR [id number]-subclonal ?RAD21 amplification ?  ?PDL 1 expression 1%. ? ?PRIOR THERAPY: 1) Systemic chemotherapy with carboplatin for AUC of 5, Alimta 500 mg/M2 and Keytruda 200 mg IV every 3 weeks.  First dose April 12, 2018. Status post 4 cycles. starting from cycle #3, Keytruda was dropped and patient received treatment with Carboplatin for an AUC of 5 and Alimta 500 mg/m2 IV every 3 weeks.  ?2) Systemic chemotherapy with Alimta 500 mg/m2 IV every 3 weeks. Status post 3 cycles of single agent Alimta. Most recent dose given on 08/23/2018.  The treatment is currently on hold secondary to renal insufficiency. ? ?CURRENT THERAPY: Observation. ? ?INTERVAL HISTORY: ?[name] 66 y.o. female returns to clinic today for 49-monthfollow-up visit.  She was last seen by Dr. MJulien Nordmannon 11/18/2021.  The patient has a lot of chronic medical concerns.  She has chronic shortness of breath with exertion.  The patient is on 3 L of supplemental oxygen.  The patient follows closely with pulmonology.  She last saw Dr. BLamonte Sakailast month.  She is on Breztri and Mucinex DM.  She also mentions she had a couple episode of mild blood-tinged sputum.  The patient gets tachycardic with minimal exertion and sees cardiology (expected to follow-up in a few weeks).  Dr. BLamonte Sakaireferred the patient to pulmonary rehab; however, it looks like they were unable to get in touch with the patient and they have closed the referral.  I discussed this with the patient today.  She seemed reluctant because she was worried about activity triggering her tachycardia.  She  notes her shortness of breath limits her quality of life.  She also reports that for the last several years she had intermittent nausea and vomiting.  She states she has a history of what sounds like is an esophageal stricture, and she was supposed to see GI but her procedure was canceled secondary to the COVID-19 pandemic at that time.  She does not have any antiemetics.  She also has a history of cholecystectomy.  Therefore, she has chronic diarrhea.  She is not sure who her gastroenterologist is.  She did not see her PCP for this ongoing concern.  She is unable to correlate the onset of her nausea and vomiting with any provoking factors. ? ?She denies any fever, chills, or night sweats.  She denies any unexplained weight loss. Denies any headache or visual changes except she occasionally may have a mild self-limiting left temporal headache which resolves without any intervention.  The patient recently had a restaging CT scan performed.  She is here today for evaluation to review her scan results. ? ? ? ?MEDICAL HISTORY: ?Past Medical History:  ?Diagnosis Date  ? Anemia   ? "long time ago" (12/06/2012)  ? Anxiety   ? Arthritis   ? "right knee real bad; in my hands bad" (12/06/2012)  ? Asthma   ? Celiac disease   ? Colon polyps   ? COPD ([place]ronic obstructive pulmonary disease) ([place]   ? Coronary artery disease   ? [facility];  b.  LHC 05/2008: Proximal RCA 25%, mid RCA stent patent.  c. Anormal nuc 2014 -> s/p LHC with severe mRCA stenosis s/p DES. d. Cath 04/2013 s/p DES to LAD.  e. LHC 08/2015 was stable.  ? Depression   ? "years ago" (12/06/2012)  ? Fibromyalgia   ? Gallstones   ? GERD (gastroesophageal reflux disease)   ? H/O hiatal hernia   ? Hepatitis   ? "not A, B, or C" (12/06/2012)  ? Hyperlipidemia   ? intol to statins and other chol agents due to elevated LFTs  ? Hypertension   ? Hypothyroidism   ? IBS (irritable bowel syndrome)   ? Interstitial cystitis   ? Lung cancer ([place]) dx'd 01/2018  ?  Lung nodules   ? Bilateral  ? Migraines   ? "when I was younger" (12/06/2012)  ? Pneumonia   ? PONV (postoperative nausea and vomiting)   ? "Very bad"  ? Premature atrial contractions   ? PVC's (premature ventricular contractions)   ? Sinus headache   ? SVT (supraventricular tachycardia) ([place])   ? ? ?ALLERGIES:  is allergic to ciprofloxacin, levofloxacin, statins, tricor [fenofibrate], compazine [prochlorperazine], latex, codeine, and metoprolol. ? ?MEDICATIONS:  ?Current Outpatient Medications  ?Medication Sig Dispense Refill  ? albuterol (VENTOLIN HFA) 108 (90 Base) MCG/ACT inhaler Inhale 1 puff into the lungs every 6 (six) hours as needed (wheezing/shortness of breath.).     ? aspirin 81 MG chewable tablet Chew 1 tablet (81 mg total) by mouth daily.    ? atorvastatin (LIPITOR) 20 MG tablet Take by mouth.    ? Budeson-Glycopyrrol-Formoterol (BREZTRI AEROSPHERE) 160-9-4.8 MCG/ACT AERO Inhale 2 puffs into the lungs in the morning and at bedtime. 10.7 g 5  ? buPROPion (WELLBUTRIN SR) 150 MG 12 hr tablet Take 150 mg by mouth daily.     ? ipratropium-albuterol (DUONEB) 0.5-2.5 (3) MG/3ML SOLN Inhale 3 mLs into the lungs every 6 (six) hours as needed (Asthma). 360 mL 3  ? LORazepam (ATIVAN) 2 MG tablet Take 2 mg by mouth every evening.    ? metoprolol tartrate (LOPRESSOR) 25 MG tablet TAKE 1/2 TABLET(12.5 MG) BY MOUTH TWICE DAILY 90 tablet 2  ? nitroGLYCERIN (NITROSTAT) 0.4 MG SL tablet Place 1 tablet (0.4 mg total) under the tongue every 5 (five) minutes as needed for chest pain. 25 tablet 12  ? ondansetron (ZOFRAN) 8 MG tablet Take 1 tablet (8 mg total) by mouth every 8 (eight) hours as needed for nausea or vomiting. 30 tablet 0  ? albuterol (PROVENTIL) (5 MG/ML) 0.5% nebulizer solution Take 2.5 mg by nebulization every 6 (six) hours as needed for wheezing. (Patient not taking: Reported on 07/10/2021)    ? ALPRAZolam (XANAX) 0.5 MG tablet for sedation before MRI scan; take 1 tab 1 hour before scan; may repeat 1 tab 15  min before scan (Patient not taking: Reported on 04/17/2021) 3 tablet 0  ? BELSOMRA 20 MG TABS Take 20 mg by mouth at bedtime as needed for sleep. (Patient not taking: Reported on 07/10/2021)    ? DULoxetine (CYMBALTA) 60 MG capsule Take 120 mg by mouth daily. (Patient not taking: Reported on 07/10/2021)    ? HYDROcodone-acetaminophen (NORCO) 7.5-325 MG per tablet Take 1 tablet by mouth every 6 (six) hours as needed for pain. (Patient not taking: Reported on 04/17/2021)    ? montelukast (SINGULAIR) 10 MG tablet Take 10 mg by mouth at bedtime. (Patient not taking: Reported on 07/10/2021)    ? Vitamin D, Ergocalciferol, (DRISDOL) 1.25  MG (50000 UT) CAPS capsule Take 50,000 Units by mouth every 7 (seven) days. Wednesdays (Patient not taking: Reported on 04/17/2021)    ? ?No current facility-administered medications for this visit.  ? ? ?SURGICAL HISTORY:  ?Past Surgical History:  ?Procedure Laterality Date  ? CARDIAC CATHETERIZATION  04/2008; 05/2008  ? CARDIAC CATHETERIZATION N/A 08/07/2015  ? Procedure: Left Heart Cath and Coronary Angiography;  Surgeon: Sherren Mocha, MD;  Location: [place] CV LAB;  Service: Cardiovascular;  Laterality: N/A;  ? CHOLECYSTECTOMY    ? CORONARY ANGIOPLASTY WITH STENT PLACEMENT  04/2008 12/06/2012  ? "1 + 1" (12/06/2012)  ? CORONARY STENT PLACEMENT  05/08/2013  ? DES TO LAD      DR [facility]  ? IR IMAGING GUIDED PORT INSERTION  04/29/2018  ? LEFT HEART CATHETERIZATION WITH CORONARY ANGIOGRAM N/A 12/06/2012  ? Procedure: LEFT HEART CATHETERIZATION WITH CORONARY ANGIOGRAM;  Surgeon: [name], MD;  Location: [facility] CATH LAB;  Service: Cardiovascular;  Laterality: N/A;  ? LEFT HEART CATHETERIZATION WITH CORONARY ANGIOGRAM N/A 05/08/2013  ? Procedure: LEFT HEART CATHETERIZATION WITH CORONARY ANGIOGRAM;  Surgeon: [name], MD;  Location: [facility] CATH LAB;  Service: Cardiovascular;  Laterality: N/A;  ? LEFT HEART CATHETERIZATION WITH CORONARY ANGIOGRAM N/A 01/04/2014  ? Procedure: LEFT  HEART CATHETERIZATION WITH CORONARY ANGIOGRAM;  Surgeon: [name], MD;  Location: [facility] CATH LAB;  Service: Cardiovascular;  Laterality: N/A;  ? PERCUTANEOUS CORONARY STENT INTERVENTION (PCI-S

## 2021-07-01 NOTE — Telephone Encounter (Signed)
.  Called patient to schedule appointment per 5/1 inbasket, patient is aware of date and time.   ?

## 2021-07-07 ENCOUNTER — Ambulatory Visit: Payer: Managed Care, Other (non HMO) | Admitting: Physician Assistant

## 2021-07-10 ENCOUNTER — Inpatient Hospital Stay: Payer: Managed Care, Other (non HMO)

## 2021-07-10 ENCOUNTER — Other Ambulatory Visit: Payer: Self-pay

## 2021-07-10 ENCOUNTER — Inpatient Hospital Stay: Payer: Managed Care, Other (non HMO) | Admitting: Physician Assistant

## 2021-07-10 VITALS — BP 117/83 | HR 103 | Temp 97.9°F | Resp 18 | Wt 182.4 lb

## 2021-07-10 DIAGNOSIS — C3492 Malignant neoplasm of unspecified part of left bronchus or lung: Secondary | ICD-10-CM | POA: Diagnosis present

## 2021-07-10 DIAGNOSIS — I10 Essential (primary) hypertension: Secondary | ICD-10-CM | POA: Diagnosis not present

## 2021-07-10 DIAGNOSIS — R112 Nausea with vomiting, unspecified: Secondary | ICD-10-CM | POA: Diagnosis not present

## 2021-07-10 DIAGNOSIS — C3491 Malignant neoplasm of unspecified part of right bronchus or lung: Secondary | ICD-10-CM | POA: Diagnosis not present

## 2021-07-10 DIAGNOSIS — Z95828 Presence of other vascular implants and grafts: Secondary | ICD-10-CM

## 2021-07-10 LAB — CMP (CANCER CENTER ONLY)
ALT: 17 U/L (ref 0–44)
AST: 16 U/L (ref 15–41)
Albumin: 4.2 g/dL (ref 3.5–5.0)
Alkaline Phosphatase: 85 U/L (ref 38–126)
Anion gap: 7 (ref 5–15)
BUN: 22 mg/dL (ref 8–23)
CO2: 28 mmol/L (ref 22–32)
Calcium: 9.5 mg/dL (ref 8.9–10.3)
Chloride: 106 mmol/L (ref 98–111)
Creatinine: 1.21 mg/dL — ABNORMAL HIGH (ref 0.44–1.00)
GFR, Estimated: 49 mL/min — ABNORMAL LOW (ref >60)
Glucose, Bld: 131 mg/dL — ABNORMAL HIGH (ref 70–99)
Potassium: 3.9 mmol/L (ref 3.5–5.1)
Sodium: 141 mmol/L (ref 135–145)
Total Bilirubin: 0.5 mg/dL (ref 0.3–1.2)
Total Protein: 7.3 g/dL (ref 6.5–8.1)

## 2021-07-10 LAB — CBC WITH DIFFERENTIAL (CANCER CENTER ONLY)
Abs Immature Granulocytes: 0.03 10*3/uL (ref 0.00–0.07)
Basophils Absolute: 0.1 10*3/uL (ref 0.0–0.1)
Basophils Relative: 1 %
Eosinophils Absolute: 0.2 10*3/uL (ref 0.0–0.5)
Eosinophils Relative: 2 %
HCT: 37.6 % (ref 36.0–46.0)
Hemoglobin: 12.5 g/dL (ref 12.0–15.0)
Immature Granulocytes: 0 %
Lymphocytes Relative: 19 %
Lymphs Abs: 2 10*3/uL (ref 0.7–4.0)
MCH: 30.3 pg (ref 26.0–34.0)
MCHC: 33.2 g/dL (ref 30.0–36.0)
MCV: 91.3 fL (ref 80.0–100.0)
Monocytes Absolute: 0.7 10*3/uL (ref 0.1–1.0)
Monocytes Relative: 7 %
Neutro Abs: 7.6 10*3/uL (ref 1.7–7.7)
Neutrophils Relative %: 71 %
Platelet Count: 241 10*3/uL (ref 150–400)
RBC: 4.12 MIL/uL (ref 3.87–5.11)
RDW: 13.7 % (ref 11.5–15.5)
WBC Count: 10.5 10*3/uL (ref 4.0–10.5)
nRBC: 0 % (ref 0.0–0.2)

## 2021-07-10 MED ORDER — SODIUM CHLORIDE 0.9% FLUSH
10.0000 mL | Freq: Once | INTRAVENOUS | Status: AC
Start: 2021-07-10 — End: 2021-07-10
  Administered 2021-07-10: 10 mL

## 2021-07-10 MED ORDER — ONDANSETRON HCL 8 MG PO TABS: 8.0000 mg | ORAL_TABLET | Freq: Three times a day (TID) | ORAL | 0 refills | Status: DC | PRN

## 2021-07-10 MED ORDER — HEPARIN SOD (PORK) LOCK FLUSH 100 UNIT/ML IV SOLN
500.0000 [IU] | Freq: Once | INTRAVENOUS | Status: AC
Start: 2021-07-10 — End: 2021-07-10
  Administered 2021-07-10: 500 [IU]

## 2021-07-11 ENCOUNTER — Encounter: Payer: Self-pay | Admitting: Internal Medicine

## 2021-07-21 ENCOUNTER — Ambulatory Visit: Payer: Managed Care, Other (non HMO) | Admitting: Cardiovascular Disease

## 2021-07-21 ENCOUNTER — Encounter: Payer: Self-pay | Admitting: Cardiovascular Disease

## 2021-07-21 VITALS — BP 122/78 | HR 107 | Ht 67.0 in | Wt 187.6 lb

## 2021-07-21 DIAGNOSIS — R002 Palpitations: Secondary | ICD-10-CM | POA: Diagnosis not present

## 2021-07-21 DIAGNOSIS — I2510 Atherosclerotic heart disease of native coronary artery without angina pectoris: Secondary | ICD-10-CM

## 2021-07-21 DIAGNOSIS — E785 Hyperlipidemia, unspecified: Secondary | ICD-10-CM | POA: Diagnosis not present

## 2021-07-21 NOTE — Progress Notes (Signed)
Chief Complaint  Patient presents with   Follow-up    CAD   History of Present Illness: 66 yo female with history of asthma, depression, GERD, fibromyalgia, advanced lung cancer, PVCs, PACs, SVT and CAD who is here today for cardiac follow up. [place] 04/2008 demonstrated 80% stenosis in the mid RCA treated with a Xience DES. Patient was noted to have possible SVT during cardiac rehabilitation 05/2008. Re-look LHC 05/2008: Proximal RCA 25%, mid RCA stent patent. Holter 03/2009: NSR, PACs and PVCs. Liver biopsy 2011 reportedly normal (steatohepatitis). Stress myoview on  11/21/12 showed a reversible basal inferior perfusion defect. She described class III angina on her f/u in our office 12/02/12. Cardiac cath 12/06/12 with severe stenosis in the mid RCA just before the old stent which also had restenosis. I treated the entire segment with a 2.75 x 28 mm Promus Premier DES. This was post-dilated to 3.0 mm. She had recurrent chest pain and was seen in the office 05/05/13. Cardiac cath 05/08/13 with severe stenosis mid LAD. A 2.5 x 16 mm Promus DES was deployed in the mid LAD. She also had c/o palpitations. She called our office in August 2015 c/o tachycardia, heart racing. Lopressor 12.5 mg po BID started. 24 hour monitor November 2015 with PACs/PVCs and 4 beat run SVT.  Last cath June 2017 with patent stents in the LAD and RCA. LVEF was normal. She was seen by Melina Copa, PA-C on 10/06/2016 and complained of exertional dyspnea. Subsequently she was ordered to undergo a nuclear stress to assess for ischemia. This was done 10/09/2016 and showed no evidence of ischemia or prior infarction. EF was noted to be mildly decreased at 48%. Echo August 2018 showed normal left ventricular EF at 23-34%, grade 1 diastolic dysfunction was noted. Wall motion was normal. No significant valvular disease was noted. She also ordered to get labs given her dyspnea. Pro BNP was normal. CBC negative for anemia. Nuclear stress test November  2019 with no ischemia. Cardiac monitor November 2019 with short runs of SVT. She has been diagnosed with small cell lung cancer in December 2019 and has undergone chemotherapy. Nuclear stress test in May 2022 with no ischemia. Cardiac monitor in June 2022 with sinus with 2 short runs of SVT, longest 8 beats. Echo June 2022 with LVEF=55-60%. Anterior echo free space. Cardiac MRI  July 2022 with large epicardial adipose layter. LVEF=56%. Basal inferoseptal scar.   She is here today for follow up. The patient denies any chest pain, dyspnea, palpitations, lower extremity edema, orthopnea, PND, dizziness, near syncope or syncope.   Primary Care Physician: [name], NP  Past Medical History:  Diagnosis Date   Anemia    "long time ago" (12/06/2012)   Anxiety    Arthritis    "right knee real bad; in my hands bad" (12/06/2012)   Asthma    Celiac disease    Colon polyps    COPD (chronic obstructive pulmonary disease) ([place])    Coronary artery disease    a. s/p Xience DES to [facility] 04/2008;  b. LHC 05/2008: Proximal RCA 25%, mid RCA stent patent.  c. Anormal nuc 2014 -> s/p LHC with severe mRCA stenosis s/p DES. d. Cath 04/2013 s/p DES to LAD.  e. LHC 08/2015 was stable.   Depression    "years ago" (12/06/2012)   Fibromyalgia    Gallstones    GERD (gastroesophageal reflux disease)    H/O hiatal hernia    Hepatitis    "not A, B,  or C" (12/06/2012)   Hyperlipidemia    intol to statins and other chol agents due to elevated LFTs   Hypertension    Hypothyroidism    IBS (irritable bowel syndrome)    Interstitial cystitis    Lung cancer ([place]) dx'd 01/2018   Lung nodules    Bilateral   Migraines    "when I was younger" (12/06/2012)   Pneumonia    PONV (postoperative nausea and vomiting)    "Very bad"   Premature atrial contractions    PVC's (premature ventricular contractions)    Sinus headache    SVT (supraventricular tachycardia) ([place])     Past Surgical History:  Procedure Laterality  Date   CARDIAC CATHETERIZATION  04/2008; 05/2008   CARDIAC CATHETERIZATION N/A 08/07/2015   Procedure: Left Heart Cath and Coronary Angiography;  Surgeon: Sherren Mocha, MD;  Location: [place] CV LAB;  Service: Cardiovascular;  Laterality: N/A;   CHOLECYSTECTOMY     CORONARY ANGIOPLASTY WITH STENT PLACEMENT  04/2008 12/06/2012   "1 + 1" (12/06/2012)   CORONARY STENT PLACEMENT  05/08/2013   DES TO LAD      DR Kaulana Brindle   IR IMAGING GUIDED PORT INSERTION  04/29/2018   LEFT HEART CATHETERIZATION WITH CORONARY ANGIOGRAM N/A 12/06/2012   Procedure: LEFT HEART CATHETERIZATION WITH CORONARY ANGIOGRAM;  Surgeon: [name], MD;  Location[facility] CATH LAB;  Service: Cardiovascular;  Laterality: N/A;   LEFT HEART CATHETERIZATION WITH CORONARY ANGIOGRAM N/A 05/08/2013   Procedure: LEFT HEART CATHETERIZATION WITH CORONARY ANGIOGRAM;  Surgeon: [name], MD;  Location: [facility] CATH LAB;  Service: Cardiovascular;  Laterality: N/A;   LEFT HEART CATHETERIZATION WITH CORONARY ANGIOGRAM N/A 01/04/2014   Procedure: LEFT HEART CATHETERIZATION WITH CORONARY ANGIOGRAM;  Surgeon: [name], MD;  Location: [facility] CATH LAB;  Service: Cardiovascular;  Laterality: N/A;   PERCUTANEOUS CORONARY STENT INTERVENTION (PCI-S)  12/06/2012   Procedure: PERCUTANEOUS CORONARY STENT INTERVENTION (PCI-S);  Surgeon: [name], MD;  Location: [facility] CATH LAB;  Service: Cardiovascular;;   PERCUTANEOUS CORONARY STENT INTERVENTION (PCI-S)  05/08/2013   Procedure: PERCUTANEOUS CORONARY STENT INTERVENTION (PCI-S);  Surgeon: [name], MD;  Location: [facility] CATH LAB;  Service: Cardiovascular;;  mid LAD    TUBAL LIGATION     VAGINAL HYSTERECTOMY     VIDEO BRONCHOSCOPY WITH ENDOBRONCHIAL NAVIGATION N/A 02/17/2018   Procedure: VIDEO BRONCHOSCOPY WITH ENDOBRONCHIAL NAVIGATION;  Surgeon: Melrose Nakayama, MD;  Location: [facility] OR;  Service: Thoracic;  Laterality: N/A;    Current Outpatient Medications   Medication Sig Dispense Refill   albuterol (PROVENTIL) (5 MG/ML) 0.5% nebulizer solution Take 2.5 mg by nebulization every 6 (six) hours as needed for wheezing.     albuterol (VENTOLIN HFA) 108 (90 Base) MCG/ACT inhaler Inhale 1 puff into the lungs every 6 (six) hours as needed (wheezing/shortness of breath.).      aspirin 81 MG chewable tablet Chew 1 tablet (81 mg total) by mouth daily.     atorvastatin (LIPITOR) 20 MG tablet Take by mouth.     Budeson-Glycopyrrol-Formoterol (BREZTRI AEROSPHERE) 160-9-4.8 MCG/ACT AERO Inhale 2 puffs into the lungs in the morning and at bedtime. 10.7 g 5   buPROPion (WELLBUTRIN SR) 150 MG 12 hr tablet Take 150 mg by mouth daily.      HYDROcodone-acetaminophen (NORCO) 7.5-325 MG per tablet Take 1 tablet by mouth every 6 (six) hours as needed for pain.     ipratropium-albuterol (DUONEB) 0.5-2.5 (3) MG/3ML SOLN Inhale 3 mLs into the lungs every 6 (  six) hours as needed (Asthma). 360 mL 3   LORazepam (ATIVAN) 2 MG tablet Take 2 mg by mouth every evening.     metoprolol tartrate (LOPRESSOR) 25 MG tablet TAKE 1/2 TABLET(12.5 MG) BY MOUTH TWICE DAILY 90 tablet 2   nitroGLYCERIN (NITROSTAT) 0.4 MG SL tablet Place 1 tablet (0.4 mg total) under the tongue every 5 (five) minutes as needed for chest pain. 25 tablet 12   Vitamin D, Ergocalciferol, (DRISDOL) 1.25 MG (50000 UT) CAPS capsule Take 50,000 Units by mouth every 7 (seven) days. Wednesdays     No current facility-administered medications for this visit.    Allergies  Allergen Reactions   Ciprofloxacin Hives   Levofloxacin     UNSPECIFIED REACTION, BUT LIKELY HIVES > HIVES WITH CIPRO   Statins Other (See Comments)    Liver enzymes Liver enzymes   Tricor [Fenofibrate] Hives and Itching   Compazine [Prochlorperazine]     Hx of tardive dyskinesia with compazine   Latex Other (See Comments)    Patient states "Messes with my skin."   Codeine Nausea And Vomiting   Metoprolol Other (See Comments)    Does not  tolerate beta blockers 10/02/2013-pt states she can tolerate 25 mg and lower     Social History   Socioeconomic History   Marital status: Married    Spouse name: Laverna Peace   Number of children: 4   Years of education: Not on file   Highest education level: Bachelor's degree (e.g., BA, AB, BS)  Occupational History    Comment: owned a day care  Tobacco Use   Smoking status: Former    Packs/day: 0.50    Years: 30.00    Pack years: 15.00    Types: Cigarettes    Quit date: 2009    Years since quitting: 14.3   Smokeless tobacco: Never   Tobacco comments:    12/06/2012 "quit smoking cigarettes ~ 8 yr ago"  Vaping Use   Vaping Use: Never used  Substance and Sexual Activity   Alcohol use: No   Drug use: No   Sexual activity: Yes  Other Topics Concern   Not on file  Social History Narrative   Lives with husband   Social Determinants of Health   Financial Resource Strain: Not on file  Food Insecurity: Not on file  Transportation Needs: Not on file  Physical Activity: Not on file  Stress: Not on file  Social Connections: Not on file  Intimate Partner Violence: Not on file   Family History: No CAD  Review of Systems:  As stated in the HPI and otherwise negative.   BP 122/78   Pulse (!) 107   Ht 5' 7"  (1.702 m)   Wt 187 lb 9.6 oz (85.1 kg)   SpO2 96%   BMI 29.38 kg/m   Physical Examination: General: Well developed, well nourished, NAD  HEENT: OP clear, mucus membranes moist  SKIN: warm, dry. No rashes. Neuro: No focal deficits  Musculoskeletal: Muscle strength 5/5 all ext  Psychiatric: Mood and affect normal  Neck: No JVD, no carotid bruits, no thyromegaly, no lymphadenopathy.  Lungs:Clear bilaterally, no wheezes, rhonci, crackles Cardiovascular: Regular rate and rhythm. No murmurs, gallops or rubs. Abdomen:Soft. Bowel sounds present. Non-tender.  Extremities: No lower extremity edema. Pulses are 2 + in the bilateral DP/PT.  EKG:  EKG is  ordered today. The ekg  ordered today demonstrates   Recent Labs: 07/10/2021: ALT 17; BUN 22; Creatinine 1.21; Hemoglobin 12.5; Platelet Count 241; Potassium 3.9; Sodium  141   Lipid Panel     Component Value Date/Time   CHOL 284 (H) 02/01/2018 1539   TRIG 248 (H) 02/01/2018 1539   HDL 51 02/01/2018 1539   CHOLHDL 5.6 (H) 02/01/2018 1539   CHOLHDL 4.2 08/22/2015 1408   VLDL 37 (H) 08/22/2015 1408   LDLCALC 183 (H) 02/01/2018 1539   LDLDIRECT 198 (H) 02/01/2018 1539   LDLDIRECT 186 (H) 08/22/2015 1408   LABVLDL 50 (H) 02/01/2018 1539     Wt Readings from Last 3 Encounters:  07/21/21 187 lb 9.6 oz (85.1 kg)  07/10/21 182 lb 6.4 oz (82.7 kg)  04/17/21 179 lb 6.4 oz (81.4 kg)     Other studies Reviewed: Additional studies/ records that were reviewed today include: . Review of the above records demonstrates:    Assessment and Plan:   1. CAD without angina: Last cath in 2017 with patent LAD and RCA stents. Nuclear stress test in 2022 with no ischemia. LV function normal by echo in 2022. Continue ASA, statin and beta blocker.   2. Palpitations/PAC/PVC/sinus tachycardia: She has rare palpitations. Continue beta blocker.    3. Hyperlipidemia: She is tolerating low dose statin. Seen in lipid clinic but did not wish to start [place] due to her advanced lung cancer  4. Lung cancer: Advanced lung cancer. No plans for further treatment.   Current medicines are reviewed at length with the patient today.  The patient does not have concerns regarding medicines.  The following changes have been made:  no change  Labs/ tests ordered today include:   Orders Placed This Encounter  Procedures   EKG 12-Lead    Disposition:   FU with me in 12  months  Signed, Lauree Chandler, MD 07/21/2021 4:02 PM    [facility] [address], [place], [place]  [address] Phone: [phone]; Fax: [phone]

## 2021-07-21 NOTE — Patient Instructions (Signed)
Medication Instructions:  No changes *If you need a refill on your cardiac medications before your next appointment, please call your pharmacy*   Lab Work: none If you have labs (blood work) drawn today and your tests are completely normal, you will receive your results only by: [place] (if you have MyChart) OR A paper copy in the mail If you have any lab test that is abnormal or we need to change your treatment, we will call you to review the results.   Testing/Procedures: none   Follow-Up: At [facility], you and your health needs are our priority.  As part of our continuing mission to provide you with exceptional heart care, we have created designated Provider Care Teams.  These Care Teams include your primary Cardiologist (physician) and Advanced Practice Providers (APPs -  Physician Assistants and Nurse Practitioners) who all work together to provide you with the care you need, when you need it.   Your next appointment:   12 month(s)  The format for your next appointment:   In Person  Provider:   Lauree Chandler, MD

## 2021-09-22 ENCOUNTER — Other Ambulatory Visit: Payer: Self-pay

## 2021-10-14 ENCOUNTER — Other Ambulatory Visit: Payer: Self-pay | Admitting: *Deleted

## 2021-10-14 MED ORDER — BREZTRI AEROSPHERE 160-9-4.8 MCG/ACT IN AERO: 2.0000 | INHALATION_SPRAY | Freq: Two times a day (BID) | RESPIRATORY_TRACT | 5 refills | Status: DC

## 2021-11-10 ENCOUNTER — Encounter: Payer: Self-pay | Admitting: Physician Assistant

## 2021-12-10 ENCOUNTER — Other Ambulatory Visit: Payer: Self-pay | Admitting: Physician Assistant

## 2022-01-09 ENCOUNTER — Other Ambulatory Visit: Payer: Managed Care, Other (non HMO)

## 2022-01-10 ENCOUNTER — Other Ambulatory Visit (HOSPITAL_COMMUNITY): Payer: Managed Care, Other (non HMO)

## 2022-01-12 ENCOUNTER — Inpatient Hospital Stay: Payer: Managed Care, Other (non HMO)

## 2022-01-12 ENCOUNTER — Other Ambulatory Visit (HOSPITAL_COMMUNITY): Payer: Managed Care, Other (non HMO)

## 2022-01-12 ENCOUNTER — Encounter (HOSPITAL_COMMUNITY): Payer: Self-pay

## 2022-01-14 ENCOUNTER — Ambulatory Visit: Payer: Managed Care, Other (non HMO) | Admitting: Internal Medicine

## 2022-01-15 ENCOUNTER — Inpatient Hospital Stay: Payer: Managed Care, Other (non HMO)

## 2022-01-15 ENCOUNTER — Other Ambulatory Visit: Payer: Self-pay

## 2022-01-15 ENCOUNTER — Inpatient Hospital Stay: Payer: Managed Care, Other (non HMO) | Attending: Physician Assistant

## 2022-01-15 ENCOUNTER — Ambulatory Visit
Admission: RE | Admit: 2022-01-15 | Discharge: 2022-01-15 | Disposition: A | Discharge status: Home / Self Care | Payer: Managed Care, Other (non HMO) | Source: Ambulatory Visit | Attending: Physician Assistant | Admitting: Physician Assistant

## 2022-01-15 DIAGNOSIS — Z95828 Presence of other vascular implants and grafts: Secondary | ICD-10-CM

## 2022-01-15 DIAGNOSIS — J4489 Other specified chronic obstructive pulmonary disease: Secondary | ICD-10-CM | POA: Insufficient documentation

## 2022-01-15 DIAGNOSIS — I10 Essential (primary) hypertension: Secondary | ICD-10-CM | POA: Diagnosis not present

## 2022-01-15 DIAGNOSIS — N289 Disorder of kidney and ureter, unspecified: Secondary | ICD-10-CM | POA: Insufficient documentation

## 2022-01-15 DIAGNOSIS — C3491 Malignant neoplasm of unspecified part of right bronchus or lung: Secondary | ICD-10-CM | POA: Diagnosis not present

## 2022-01-15 DIAGNOSIS — C3492 Malignant neoplasm of unspecified part of left bronchus or lung: Secondary | ICD-10-CM | POA: Diagnosis not present

## 2022-01-15 LAB — CBC WITH DIFFERENTIAL (CANCER CENTER ONLY)
Abs Immature Granulocytes: 0.05 10*3/uL (ref 0.00–0.07)
Basophils Absolute: 0.1 10*3/uL (ref 0.0–0.1)
Basophils Relative: 1 %
Eosinophils Absolute: 0.2 10*3/uL (ref 0.0–0.5)
Eosinophils Relative: 2 %
HCT: 37.7 % (ref 36.0–46.0)
Hemoglobin: 12.5 g/dL (ref 12.0–15.0)
Immature Granulocytes: 1 %
Lymphocytes Relative: 20 %
Lymphs Abs: 1.9 10*3/uL (ref 0.7–4.0)
MCH: 31.1 pg (ref 26.0–34.0)
MCHC: 33.2 g/dL (ref 30.0–36.0)
MCV: 93.8 fL (ref 80.0–100.0)
Monocytes Absolute: 0.8 10*3/uL (ref 0.1–1.0)
Monocytes Relative: 8 %
Neutro Abs: 6.7 10*3/uL (ref 1.7–7.7)
Neutrophils Relative %: 68 %
Platelet Count: 264 10*3/uL (ref 150–400)
RBC: 4.02 MIL/uL (ref 3.87–5.11)
RDW: 13.6 % (ref 11.5–15.5)
WBC Count: 9.7 10*3/uL (ref 4.0–10.5)
nRBC: 0 % (ref 0.0–0.2)

## 2022-01-15 LAB — CMP (CANCER CENTER ONLY)
ALT: 40 U/L (ref 0–44)
AST: 36 U/L (ref 15–41)
Albumin: 4.5 g/dL (ref 3.5–5.0)
Alkaline Phosphatase: 101 U/L (ref 38–126)
Anion gap: 6 (ref 5–15)
BUN: 21 mg/dL (ref 8–23)
CO2: 27 mmol/L (ref 22–32)
Calcium: 9.3 mg/dL (ref 8.9–10.3)
Chloride: 109 mmol/L (ref 98–111)
Creatinine: 1.45 mg/dL — ABNORMAL HIGH (ref 0.44–1.00)
GFR, Estimated: 40 mL/min — ABNORMAL LOW (ref >60)
Glucose, Bld: 128 mg/dL — ABNORMAL HIGH (ref 70–99)
Potassium: 4.6 mmol/L (ref 3.5–5.1)
Sodium: 142 mmol/L (ref 135–145)
Total Bilirubin: 0.6 mg/dL (ref 0.3–1.2)
Total Protein: 7.4 g/dL (ref 6.5–8.1)

## 2022-01-15 MED ORDER — HEPARIN SOD (PORK) LOCK FLUSH 100 UNIT/ML IV SOLN
500.0000 [IU] | Freq: Once | INTRAVENOUS | Status: AC
  Administered 2022-01-15: 500 [IU]

## 2022-01-15 MED ORDER — SODIUM CHLORIDE 0.9% FLUSH
10.0000 mL | Freq: Once | INTRAVENOUS | Status: AC
  Administered 2022-01-15: 10 mL

## 2022-01-15 MED ORDER — IOPAMIDOL (ISOVUE-300) INJECTION 61%
75.0000 mL | Freq: Once | INTRAVENOUS | Status: AC | PRN
  Administered 2022-01-15: 75 mL via INTRAVENOUS

## 2022-01-19 ENCOUNTER — Inpatient Hospital Stay: Payer: Managed Care, Other (non HMO) | Admitting: Internal Medicine

## 2022-01-19 VITALS — BP 126/87 | HR 115 | Temp 98.5°F | Resp 18 | Wt 192.6 lb

## 2022-01-19 DIAGNOSIS — C3490 Malignant neoplasm of unspecified part of unspecified bronchus or lung: Secondary | ICD-10-CM | POA: Diagnosis not present

## 2022-01-19 DIAGNOSIS — C3492 Malignant neoplasm of unspecified part of left bronchus or lung: Secondary | ICD-10-CM | POA: Diagnosis not present

## 2022-01-19 NOTE — Progress Notes (Signed)
[place] [phone]   [phone]  OFFICE PROGRESS NOTE  [name], NP [place] [address]  DIAGNOSIS: stage IV (T1a, N0, M1 a) non-small cell lung cancer, adenocarcinoma presented with multifocal disease in both lungs including 3 nodules in the left lung as well as 2 nodules in the right lung diagnosed in December 2019.    Molecular studies showed: KRAS G12C BCOR [id number]-subclonal RAD21 amplification  PDL 1 expression 1%.  PRIOR THERAPY:  1) Systemic chemotherapy with carboplatin for AUC of 5, Alimta 500 mg/M2 and Keytruda 200 mg IV every 3 weeks.  First dose April 12, 2018. Status post 4 cycles. starting from cycle #3, Keytruda was dropped and patient received treatment with Carboplatin for an AUC of 5 and Alimta 500 mg/m2 IV every 3 weeks.  2) Systemic chemotherapy with Alimta 500 mg/m2 IV every 3 weeks. Status post 3 cycles of single agent Alimta. Most recent dose given on 08/23/2018.  The treatment is currently on hold secondary to renal insufficiency.   CURRENT THERAPY:  Observation.  INTERVAL HISTORY: [name] 66 y.o. female returns to the clinic today for follow-up visit.  The patient is feeling fine today with no concerning complaints except for the baseline shortness of breath and she is currently on home oxygen.  She also has aching pain all over her body and she was asking for pain medication.  She denied having any current chest pain but has shortness of breath with mild cough and no hemoptysis.  She has no nausea, vomiting, diarrhea or constipation.  She has no headache or visual changes.  She denied having any significant weight loss or night sweats.  She is here today for evaluation with repeat CT scan of the chest for restaging of her disease.  MEDICAL HISTORY: Past Medical History:  Diagnosis Date   Anemia    "long time ago" (12/06/2012)   Anxiety    Arthritis    "right knee real bad;  in my hands bad" (12/06/2012)   Asthma    Celiac disease    Colon polyps    COPD (chronic obstructive pulmonary disease) (HCC)    Coronary artery disease    a. s/p Xience DES to [facility] 04/2008;  b. LHC 05/2008: Proximal RCA 25%, mid RCA stent patent.  c. Anormal nuc 2014 -> s/p LHC with severe mRCA stenosis s/p DES. d. Cath 04/2013 s/p DES to LAD.  e. LHC 08/2015 was stable.   Depression    "years ago" (12/06/2012)   Fibromyalgia    Gallstones    GERD (gastroesophageal reflux disease)    H/O hiatal hernia    Hepatitis    "not A, B, or C" (12/06/2012)   Hyperlipidemia    intol to statins and other chol agents due to elevated LFTs   Hypertension    Hypothyroidism    IBS (irritable bowel syndrome)    Interstitial cystitis    Lung cancer ([place]) dx'd 01/2018   Lung nodules    Bilateral   Migraines    "when I was younger" (12/06/2012)   Pneumonia    PONV (postoperative nausea and vomiting)    "Very bad"   Premature atrial contractions    PVC's (premature ventricular contractions)    Sinus headache    SVT (supraventricular tachycardia) (HCC)     ALLERGIES:  is allergic to ciprofloxacin, levofloxacin, statins, tricor [fenofibrate], compazine [prochlorperazine], latex, codeine, and metoprolol.  MEDICATIONS:  Current Outpatient Medications  Medication Sig Dispense Refill   albuterol (PROVENTIL) (5 MG/ML) 0.5% nebulizer solution Take 2.5 mg by nebulization every 6 (six) hours as needed for wheezing.     albuterol (VENTOLIN HFA) 108 (90 Base) MCG/ACT inhaler Inhale 1 puff into the lungs every 6 (six) hours as needed (wheezing/shortness of breath.).      aspirin 81 MG chewable tablet Chew 1 tablet (81 mg total) by mouth daily.     atorvastatin (LIPITOR) 20 MG tablet Take by mouth.     Budeson-Glycopyrrol-Formoterol (BREZTRI AEROSPHERE) 160-9-4.8 MCG/ACT AERO Inhale 2 puffs into the lungs in the morning and at bedtime. 10.7 g 5   buPROPion (WELLBUTRIN SR) 150 MG 12 hr tablet Take 150 mg by mouth  daily.      HYDROcodone-acetaminophen (NORCO) 7.5-325 MG per tablet Take 1 tablet by mouth every 6 (six) hours as needed for pain.     ipratropium-albuterol (DUONEB) 0.5-2.5 (3) MG/3ML SOLN Inhale 3 mLs into the lungs every 6 (six) hours as needed (Asthma). 360 mL 3   LORazepam (ATIVAN) 2 MG tablet Take 2 mg by mouth every evening.     metoprolol tartrate (LOPRESSOR) 25 MG tablet TAKE 1/2 TABLET(12.5 MG) BY MOUTH TWICE DAILY 90 tablet 2   nitroGLYCERIN (NITROSTAT) 0.4 MG SL tablet Place 1 tablet (0.4 mg total) under the tongue every 5 (five) minutes as needed for chest pain. 25 tablet 12   Vitamin D, Ergocalciferol, (DRISDOL) 1.25 MG (50000 UT) CAPS capsule Take 50,000 Units by mouth every 7 (seven) days. Wednesdays     No current facility-administered medications for this visit.    SURGICAL HISTORY:  Past Surgical History:  Procedure Laterality Date   CARDIAC CATHETERIZATION  04/2008; 05/2008   CARDIAC CATHETERIZATION N/A 08/07/2015   Procedure: Left Heart Cath and Coronary Angiography;  Surgeon: Sherren Mocha, MD;  Location: [place] [facility];  Service: Cardiovascular;  Laterality: N/A;   CHOLECYSTECTOMY     CORONARY ANGIOPLASTY WITH STENT PLACEMENT  04/2008 12/06/2012   "1 + 1" (12/06/2012)   CORONARY STENT PLACEMENT  05/08/2013   DES TO LAD      DR MCALHANY   IR IMAGING GUIDED PORT INSERTION  04/29/2018   LEFT HEART CATHETERIZATION WITH CORONARY ANGIOGRAM N/A 12/06/2012   Procedure: LEFT HEART CATHETERIZATION WITH CORONARY ANGIOGRAM;  Surgeon: [name], MD;  Location: [facility] CATH LAB;  Service: Cardiovascular;  Laterality: N/A;   LEFT HEART CATHETERIZATION WITH CORONARY ANGIOGRAM N/A 05/08/2013   Procedure: LEFT HEART CATHETERIZATION WITH CORONARY ANGIOGRAM;  Surgeon: [name], MD;  Location: [facility] CATH LAB;  Service: Cardiovascular;  Laterality: N/A;   LEFT HEART CATHETERIZATION WITH CORONARY ANGIOGRAM N/A 01/04/2014   Procedure: LEFT HEART CATHETERIZATION WITH CORONARY  ANGIOGRAM;  Surgeon: [name], MD;  Location: [facility] CATH LAB;  Service: Cardiovascular;  Laterality: N/A;   PERCUTANEOUS CORONARY STENT INTERVENTION (PCI-S)  12/06/2012   Procedure: PERCUTANEOUS CORONARY STENT INTERVENTION (PCI-S);  Surgeon: [name], MD;  Location: [facility] CATH LAB;  Service: Cardiovascular;;   PERCUTANEOUS CORONARY STENT INTERVENTION (PCI-S)  05/08/2013   Procedure: PERCUTANEOUS CORONARY STENT INTERVENTION (PCI-S);  Surgeon: [name], MD;  Location: [facility] CATH LAB;  Service: Cardiovascular;;  mid LAD    TUBAL LIGATION     VAGINAL HYSTERECTOMY     VIDEO BRONCHOSCOPY WITH ENDOBRONCHIAL NAVIGATION N/A 02/17/2018   Procedure: VIDEO BRONCHOSCOPY WITH ENDOBRONCHIAL NAVIGATION;  Surgeon: Melrose Nakayama, MD;  Location: [place];  Service: Thoracic;  Laterality: N/A;    REVIEW OF SYSTEMS:  A comprehensive review of systems was negative except for: Constitutional: positive for fatigue Respiratory: positive for dyspnea on exertion Musculoskeletal: positive for arthralgias   PHYSICAL EXAMINATION: General appearance: alert, cooperative, fatigued, and no distress Head: Normocephalic, without obvious abnormality, atraumatic Neck: no adenopathy, no JVD, supple, symmetrical, trachea midline, and thyroid not enlarged, symmetric, no tenderness/mass/nodules Lymph nodes: Cervical, supraclavicular, and axillary nodes normal. Resp: clear to auscultation bilaterally Back: symmetric, no curvature. ROM normal. No CVA tenderness. Cardio: regular rate and rhythm, S1, S2 normal, no murmur, click, rub or gallop GI: soft, non-tender; bowel sounds normal; no masses,  no organomegaly Extremities: extremities normal, atraumatic, no cyanosis or edema  ECOG PERFORMANCE STATUS: 1 - Symptomatic but completely ambulatory  Blood pressure 126/87, pulse (!) 115, temperature 98.5 F (36.9 C), temperature source Oral, resp. rate 18, weight 192 lb 9.6 oz (87.4 kg), SpO2 98  %.  LABORATORY DATA: Lab Results  Component Value Date   WBC 9.7 01/15/2022   HGB 12.5 01/15/2022   HCT 37.7 01/15/2022   MCV 93.8 01/15/2022   PLT 264 01/15/2022      Chemistry      Component Value Date/Time   NA 142 01/15/2022 1350   NA 143 01/10/2018 1629   K 4.6 01/15/2022 1350   CL 109 01/15/2022 1350   CO2 27 01/15/2022 1350   BUN 21 01/15/2022 1350   BUN 14 01/10/2018 1629   CREATININE 1.45 (H) 01/15/2022 1350   CREATININE 0.68 08/05/2015 1456      Component Value Date/Time   CALCIUM 9.3 01/15/2022 1350   ALKPHOS 101 01/15/2022 1350   AST 36 01/15/2022 1350   ALT 40 01/15/2022 1350   BILITOT 0.6 01/15/2022 1350       RADIOGRAPHIC STUDIES: CT Chest W Contrast  Result Date: 01/16/2022 CLINICAL DATA:  66 year old female presents for follow-up of non-small cell lung cancer. * Tracking Code: BO * EXAM: CT CHEST WITH CONTRAST TECHNIQUE: Multidetector CT imaging of the chest was performed during intravenous contrast administration. RADIATION DOSE REDUCTION: This exam was performed according to the departmental dose-optimization program which includes automated exposure control, adjustment of the mA and/or kV according to patient size and/or use of iterative reconstruction technique. CONTRAST:  68m ISOVUE-300 IOPAMIDOL (ISOVUE-300) INJECTION 61% COMPARISON:  June 24, 2021. FINDINGS: Cardiovascular: Stable appearance of the heart great vessels with signs of calcified and noncalcified atheromatous plaque in the thoracic aorta. No aneurysmal dilation. Normal heart size. Three-vessel coronary artery disease as before. Mediastinum/Nodes: No signs of adenopathy in the chest. Esophagus is grossly normal. Lungs/Pleura: Potential new area of nodularity about bronchovascular structures with potential endobronchial extension (image 82/2) this measures 10 x 9 mm this found in the RIGHT infrahilar region. Small lymph node adjacent to endobronchial material about the RIGHT hilum is also  considered but this is a new finding compared to prior imaging. New solid nodule in the RIGHT lower lobe (image 119/3) this is in the medial RIGHT lower lobe. Inspissated material within RIGHT lower lobe bronchi as well. Patchy areas of ground-glass opacity in the upper lobes are similar compared to previous imaging. Area in the dependent LEFT chest in the LEFT lower lobe with solid component measuring 12 x 9 mm is very similar when measured by this observer on the prior study. Surrounding ground-glass is also similar measuring up to 3-3.2 cm greatest axial dimension with respect to surrounding ground-glass attenuation. Superior segment part solid nodule (image 37/3) 14 x 12 mm previously 14 x 12 mm. No pleural effusion.  Upper Abdomen: Incidental imaging of upper abdominal contents with signs of hepatic steatosis. Post cholecystectomy. Liver and biliary tree incompletely imaged. No acute findings in the upper abdomen. No adenopathy in the upper abdomen. Musculoskeletal: No chest wall abnormality. No acute or significant osseous findings. IMPRESSION: 1. Potential new area of nodularity about bronchovascular structures with potential endobronchial extension. Small lymph node adjacent to endobronchial material about the RIGHT hilum is also considered but this is a new finding compared to prior imaging. Suggest close attention on follow-up. 2. New solid nodule in the medial RIGHT lower lobe. Concerning for additional site of disease. Could consider PET imaging or short interval follow-up. 3. Similar appearance of known LEFT lower lobe ground-glass and subsolid nodules. 4. Patchy areas of ground-glass opacity in the upper lobes are similar compared to previous imaging. Some areas are more focal and in the context of known multifocal bronchogenic carcinoma these remains suspicious for additional sites of disease. 5. Three-vessel coronary artery disease as before. 6. Hepatic steatosis. 7. Aortic atherosclerosis. Aortic  Atherosclerosis (ICD10-I70.0). Electronically Signed   By: Zetta Bills M.D.   On: 01/16/2022 14:41     ASSESSMENT AND PLAN: This is a very pleasant 66 years old white female recently diagnosed with a stage IV non-small cell lung cancer, adenocarcinoma with no actionable mutations and PDL 1 expression of 1% diagnosed in December 2019 and presented with bilateral pulmonary nodules. The patient was initially started on treatment with carboplatin, Alimta and Keytruda status post 4 cycles but Keytruda was discontinued after cycle #2 secondary to significant skin rash. She was treated with maintenance treatment with single agent Alimta for 3 cycles but this is currently on hold since June 2020 secondary to worsening renal insufficiency. The patient is currently on observation and she is feeling fine today with no concerning complaints except for the baseline shortness of breath secondary to COPD. The patient continues to have the baseline shortness of breath. She had repeat CT scan of the chest performed recently.  I personally and independently reviewed the scan images and discussed the result with the patient today. Her scan showed new area of nodularity about the bronchovascular structure with potential endobronchial extension and small lymph node adjacent to endobronchial material about the right hilum is considered. She also has new solid nodule in the medial right lower lobe concerning for additional site of disease. I recommended for the patient to have a PET scan for further evaluation of these lesions. I will see her back for follow-up visit in 3 weeks for evaluation and discussion of her PET scan results and further recommendation regarding her condition. The patient was advised to call immediately if she has any other concerning symptoms in the interval.  The patient voices understanding of current disease status and treatment options and is in agreement with the current care plan. All  questions were answered. The patient knows to call the clinic with any problems, questions or concerns. We can certainly see the patient much sooner if necessary.   Disclaimer: This note was dictated with voice recognition software. Similar sounding words can inadvertently be transcribed and may not be corrected upon review.

## 2022-02-07 NOTE — Progress Notes (Deleted)
[facility] OFFICE PROGRESS NOTE  [name], NP [place] [id number]  DIAGNOSIS: ***  PRIOR THERAPY:  CURRENT THERAPY:  INTERVAL HISTORY: [name] 66 y.o. female returns for *** regular *** visit for followup of ***   MEDICAL HISTORY: Past Medical History:  Diagnosis Date   Anemia    "long time ago" (12/06/2012)   Anxiety    Arthritis    "right knee real bad; in my hands bad" (12/06/2012)   Asthma    Celiac disease    Colon polyps    COPD (chronic obstructive pulmonary disease) (HCC)    Coronary artery disease    a. s/p Xience DES to [facility] 04/2008;  b. LHC 05/2008: Proximal RCA 25%, mid RCA stent patent.  c. Anormal nuc 2014 -> s/p LHC with severe mRCA stenosis s/p DES. d. Cath 04/2013 s/p DES to LAD.  e. LHC 08/2015 was stable.   Depression    "years ago" (12/06/2012)   Fibromyalgia    Gallstones    GERD (gastroesophageal reflux disease)    H/O hiatal hernia    Hepatitis    "not A, B, or C" (12/06/2012)   Hyperlipidemia    intol to statins and other chol agents due to elevated LFTs   Hypertension    Hypothyroidism    IBS (irritable bowel syndrome)    Interstitial cystitis    Lung cancer ([facility]) dx'd 01/2018   Lung nodules    Bilateral   Migraines    "when I was younger" (12/06/2012)   Pneumonia    PONV (postoperative nausea and vomiting)    "Very bad"   Premature atrial contractions    PVC's (premature ventricular contractions)    Sinus headache    SVT (supraventricular tachycardia) (HCC)     ALLERGIES:  is allergic to ciprofloxacin, levofloxacin, statins, tricor [fenofibrate], compazine [prochlorperazine], latex, codeine, and metoprolol.  MEDICATIONS:  Current Outpatient Medications  Medication Sig Dispense Refill   albuterol (PROVENTIL) (5 MG/ML) 0.5% nebulizer solution Take 2.5 mg by nebulization every 6 (six) hours as needed for wheezing.     albuterol (VENTOLIN HFA) 108 (90 Base) MCG/ACT inhaler Inhale 1 puff  into the lungs every 6 (six) hours as needed (wheezing/shortness of breath.).      aspirin 81 MG chewable tablet Chew 1 tablet (81 mg total) by mouth daily.     atorvastatin (LIPITOR) 20 MG tablet Take by mouth.     Budeson-Glycopyrrol-Formoterol (BREZTRI AEROSPHERE) 160-9-4.8 MCG/ACT AERO Inhale 2 puffs into the lungs in the morning and at bedtime. 10.7 g 5   buPROPion (WELLBUTRIN SR) 150 MG 12 hr tablet Take 150 mg by mouth daily.      HYDROcodone-acetaminophen (NORCO) 7.5-325 MG per tablet Take 1 tablet by mouth every 6 (six) hours as needed for pain.     ipratropium-albuterol (DUONEB) 0.5-2.5 (3) MG/3ML SOLN Inhale 3 mLs into the lungs every 6 (six) hours as needed (Asthma). 360 mL 3   LORazepam (ATIVAN) 2 MG tablet Take 2 mg by mouth every evening.     metoprolol tartrate (LOPRESSOR) 25 MG tablet TAKE 1/2 TABLET(12.5 MG) BY MOUTH TWICE DAILY 90 tablet 2   nitroGLYCERIN (NITROSTAT) 0.4 MG SL tablet Place 1 tablet (0.4 mg total) under the tongue every 5 (five) minutes as needed for chest pain. 25 tablet 12   Vitamin D, Ergocalciferol, (DRISDOL) 1.25 MG (50000 UT) CAPS capsule Take 50,000 Units by mouth every 7 (seven) days. Wednesdays     No  current facility-administered medications for this visit.    SURGICAL HISTORY:  Past Surgical History:  Procedure Laterality Date   CARDIAC CATHETERIZATION  04/2008; 05/2008   CARDIAC CATHETERIZATION N/A 08/07/2015   Procedure: Left Heart Cath and Coronary Angiography;  Surgeon: Sherren Mocha, MD;  Location: [facility];  Service: Cardiovascular;  Laterality: N/A;   CHOLECYSTECTOMY     CORONARY ANGIOPLASTY WITH STENT PLACEMENT  04/2008 12/06/2012   "1 + 1" (12/06/2012)   CORONARY STENT PLACEMENT  05/08/2013   DES TO LAD      DR MCALHANY   IR IMAGING GUIDED PORT INSERTION  04/29/2018   LEFT HEART CATHETERIZATION WITH CORONARY ANGIOGRAM N/A 12/06/2012   Procedure: LEFT HEART CATHETERIZATION WITH CORONARY ANGIOGRAM;  Surgeon: [name],  MD;  Location: [facility] CATH LAB;  Service: Cardiovascular;  Laterality: N/A;   LEFT HEART CATHETERIZATION WITH CORONARY ANGIOGRAM N/A 05/08/2013   Procedure: LEFT HEART CATHETERIZATION WITH CORONARY ANGIOGRAM;  Surgeon: [name], MD;  Location: [facility] CATH LAB;  Service: Cardiovascular;  Laterality: N/A;   LEFT HEART CATHETERIZATION WITH CORONARY ANGIOGRAM N/A 01/04/2014   Procedure: LEFT HEART CATHETERIZATION WITH CORONARY ANGIOGRAM;  Surgeon: [name], MD;  Location: [facility] CATH LAB;  Service: Cardiovascular;  Laterality: N/A;   PERCUTANEOUS CORONARY STENT INTERVENTION (PCI-S)  12/06/2012   Procedure: PERCUTANEOUS CORONARY STENT INTERVENTION (PCI-S);  Surgeon: [name], MD;  Location: [facility] CATH LAB;  Service: Cardiovascular;;   PERCUTANEOUS CORONARY STENT INTERVENTION (PCI-S)  05/08/2013   Procedure: PERCUTANEOUS CORONARY STENT INTERVENTION (PCI-S);  Surgeon: [name], MD;  Location: [facility] CATH LAB;  Service: Cardiovascular;;  mid LAD    TUBAL LIGATION     VAGINAL HYSTERECTOMY     VIDEO BRONCHOSCOPY WITH ENDOBRONCHIAL NAVIGATION N/A 02/17/2018   Procedure: VIDEO BRONCHOSCOPY WITH ENDOBRONCHIAL NAVIGATION;  Surgeon: Melrose Nakayama, MD;  Location: [facility];  Service: Thoracic;  Laterality: N/A;    REVIEW OF SYSTEMS:   Review of Systems  Constitutional: Negative for appetite change, chills, fatigue, fever and unexpected weight change.  HENT:   Negative for mouth sores, nosebleeds, sore throat and trouble swallowing.   Eyes: Negative for eye problems and icterus.  Respiratory: Negative for cough, hemoptysis, shortness of breath and wheezing.   Cardiovascular: Negative for chest pain and leg swelling.  Gastrointestinal: Negative for abdominal pain, constipation, diarrhea, nausea and vomiting.  Genitourinary: Negative for bladder incontinence, difficulty urinating, dysuria, frequency and hematuria.   Musculoskeletal: Negative for back pain, gait problem, neck  pain and neck stiffness.  Skin: Negative for itching and rash.  Neurological: Negative for dizziness, extremity weakness, gait problem, headaches, light-headedness and seizures.  Hematological: Negative for adenopathy. Does not bruise/bleed easily.  Psychiatric/Behavioral: Negative for confusion, depression and sleep disturbance. The patient is not nervous/anxious.     PHYSICAL EXAMINATION:  There were no vitals taken for this visit.  ECOG PERFORMANCE STATUS: {CHL ONC ECOG [id number]  Physical Exam  Constitutional: Oriented to person, place, and time and well-developed, well-nourished, and in no distress. No distress.  HENT:  Head: Normocephalic and atraumatic.  Mouth/Throat: Oropharynx is clear and moist. No oropharyngeal exudate.  Eyes: Conjunctivae are normal. Right eye exhibits no discharge. Left eye exhibits no discharge. No scleral icterus.  Neck: Normal range of motion. Neck supple.  Cardiovascular: Normal rate, regular rhythm, normal heart sounds and intact distal pulses.   Pulmonary/Chest: Effort normal and breath sounds normal. No respiratory distress. No wheezes. No rales.  Abdominal: Soft. Bowel sounds are normal. Exhibits no  distension and no mass. There is no tenderness.  Musculoskeletal: Normal range of motion. Exhibits no edema.  Lymphadenopathy:    No cervical adenopathy.  Neurological: Alert and oriented to person, place, and time. Exhibits normal muscle tone. Gait normal. Coordination normal.  Skin: Skin is warm and dry. No rash noted. Not diaphoretic. No erythema. No pallor.  Psychiatric: Mood, memory and judgment normal.  Vitals reviewed.  LABORATORY DATA: Lab Results  Component Value Date   WBC 9.7 01/15/2022   HGB 12.5 01/15/2022   HCT 37.7 01/15/2022   MCV 93.8 01/15/2022   PLT 264 01/15/2022      Chemistry      Component Value Date/Time   NA 142 01/15/2022 1350   NA 143 01/10/2018 1629   K 4.6 01/15/2022 1350   CL 109 01/15/2022 1350   CO2  27 01/15/2022 1350   BUN 21 01/15/2022 1350   BUN 14 01/10/2018 1629   CREATININE 1.45 (H) 01/15/2022 1350   CREATININE 0.68 08/05/2015 1456      Component Value Date/Time   CALCIUM 9.3 01/15/2022 1350   ALKPHOS 101 01/15/2022 1350   AST 36 01/15/2022 1350   ALT 40 01/15/2022 1350   BILITOT 0.6 01/15/2022 1350       RADIOGRAPHIC STUDIES:  CT Chest W Contrast  Result Date: 01/16/2022 CLINICAL DATA:  66 year old female presents for follow-up of non-small cell lung cancer. * Tracking Code: BO * EXAM: CT CHEST WITH CONTRAST TECHNIQUE: Multidetector CT imaging of the chest was performed during intravenous contrast administration. RADIATION DOSE REDUCTION: This exam was performed according to the departmental dose-optimization program which includes automated exposure control, adjustment of the mA and/or kV according to patient size and/or use of iterative reconstruction technique. CONTRAST:  63m ISOVUE-300 IOPAMIDOL (ISOVUE-300) INJECTION 61% COMPARISON:  June 24, 2021. FINDINGS: Cardiovascular: Stable appearance of the heart great vessels with signs of calcified and noncalcified atheromatous plaque in the thoracic aorta. No aneurysmal dilation. Normal heart size. Three-vessel coronary artery disease as before. Mediastinum/Nodes: No signs of adenopathy in the chest. Esophagus is grossly normal. Lungs/Pleura: Potential new area of nodularity about bronchovascular structures with potential endobronchial extension (image 82/2) this measures 10 x 9 mm this found in the RIGHT infrahilar region. Small lymph node adjacent to endobronchial material about the RIGHT hilum is also considered but this is a new finding compared to prior imaging. New solid nodule in the RIGHT lower lobe (image 119/3) this is in the medial RIGHT lower lobe. Inspissated material within RIGHT lower lobe bronchi as well. Patchy areas of ground-glass opacity in the upper lobes are similar compared to previous imaging. Area in the  dependent LEFT chest in the LEFT lower lobe with solid component measuring 12 x 9 mm is very similar when measured by this observer on the prior study. Surrounding ground-glass is also similar measuring up to 3-3.2 cm greatest axial dimension with respect to surrounding ground-glass attenuation. Superior segment part solid nodule (image 37/3) 14 x 12 mm previously 14 x 12 mm. No pleural effusion. Upper Abdomen: Incidental imaging of upper abdominal contents with signs of hepatic steatosis. Post cholecystectomy. Liver and biliary tree incompletely imaged. No acute findings in the upper abdomen. No adenopathy in the upper abdomen. Musculoskeletal: No chest wall abnormality. No acute or significant osseous findings. IMPRESSION: 1. Potential new area of nodularity about bronchovascular structures with potential endobronchial extension. Small lymph node adjacent to endobronchial material about the RIGHT hilum is also considered but this is a new finding compared  to prior imaging. Suggest close attention on follow-up. 2. New solid nodule in the medial RIGHT lower lobe. Concerning for additional site of disease. Could consider PET imaging or short interval follow-up. 3. Similar appearance of known LEFT lower lobe ground-glass and subsolid nodules. 4. Patchy areas of ground-glass opacity in the upper lobes are similar compared to previous imaging. Some areas are more focal and in the context of known multifocal bronchogenic carcinoma these remains suspicious for additional sites of disease. 5. Three-vessel coronary artery disease as before. 6. Hepatic steatosis. 7. Aortic atherosclerosis. Aortic Atherosclerosis (ICD10-I70.0). Electronically Signed   By: Zetta Bills M.D.   On: 01/16/2022 14:41     ASSESSMENT/PLAN:  No problem-specific Assessment & Plan notes found for this encounter.   No orders of the defined types were placed in this encounter.    I spent {CHL ONC TIME VISIT - HWKGS:[id number]} counseling the  patient face to face. The total time spent in the appointment was {CHL ONC TIME VISIT - OPFYT:[id number]}.  Trayven Lumadue L Jemia Fata, PA-C 02/07/22

## 2022-02-09 ENCOUNTER — Inpatient Hospital Stay: Payer: Managed Care, Other (non HMO) | Admitting: Physician Assistant

## 2022-02-09 ENCOUNTER — Telehealth: Payer: Self-pay | Admitting: Physician Assistant

## 2022-02-09 NOTE — Telephone Encounter (Signed)
R/s per 12/11 IB, message has been left with pt

## 2022-02-10 ENCOUNTER — Telehealth: Payer: Self-pay | Admitting: Medical Oncology

## 2022-02-10 NOTE — Telephone Encounter (Addendum)
Claustrophobia with scans--Pt requests something to calm her nerves before her PET scan on Thursday. Altamese said at her last scan  " I was told xanax 2 tablets was not enough to calm me down."  Olie takes Ativan 2 mg q hs.   Please advise.

## 2022-02-12 ENCOUNTER — Telehealth: Payer: Self-pay | Admitting: Medical Oncology

## 2022-02-12 ENCOUNTER — Ambulatory Visit (HOSPITAL_COMMUNITY)
Admission: RE | Admit: 2022-02-12 | Discharge: 2022-02-12 | Disposition: A | Discharge status: Home / Self Care | Payer: Managed Care, Other (non HMO) | Source: Ambulatory Visit | Attending: Internal Medicine | Admitting: Internal Medicine

## 2022-02-12 ENCOUNTER — Other Ambulatory Visit: Payer: Self-pay | Admitting: Medical Oncology

## 2022-02-12 DIAGNOSIS — C3490 Malignant neoplasm of unspecified part of unspecified bronchus or lung: Secondary | ICD-10-CM | POA: Diagnosis present

## 2022-02-12 LAB — GLUCOSE, CAPILLARY: Glucose-Capillary: 149 mg/dL — ABNORMAL HIGH (ref 70–99)

## 2022-02-12 MED ORDER — FLUDEOXYGLUCOSE F - 18 (FDG) INJECTION
9.5000 | Freq: Once | INTRAVENOUS | Status: AC
  Administered 2022-02-12: 9.57 via INTRAVENOUS

## 2022-02-12 NOTE — Telephone Encounter (Signed)
Sedation for claustrophobia-LVM for pt , that Dr. Julien Nordmann said to take her ativan 30 mins prior to the PET scan and have a driver.

## 2022-02-13 NOTE — Progress Notes (Unsigned)
[facility] OFFICE PROGRESS NOTE  [name], NP [place] [address]  DIAGNOSIS: Stage IV (T1a, N0, M1 a) non-small cell lung cancer, adenocarcinoma presented with multifocal disease in both lungs including 3 nodules in the left lung as well as 2 nodules in the right lung diagnosed in December 2019.     Molecular studies showed: KRAS G12C BCOR [id number]-subclonal RAD21 amplification   PDL 1 expression 1%.  PRIOR THERAPY: 1) Systemic chemotherapy with carboplatin for AUC of 5, Alimta 500 mg/M2 and Keytruda 200 mg IV every 3 weeks.  First dose April 12, 2018. Status post 4 cycles. starting from cycle #3, Keytruda was dropped and patient received treatment with Carboplatin for an AUC of 5 and Alimta 500 mg/m2 IV every 3 weeks.  2) Systemic chemotherapy with Alimta 500 mg/m2 IV every 3 weeks. Status post 3 cycles of single agent Alimta. Most recent dose given on 08/23/2018.  The treatment is currently on hold secondary to renal insufficiency.  CURRENT THERAPY: Starting on targeted treatment with Krazati in next few days.   INTERVAL HISTORY: VERNIDA MCNICHOLAS 66 y.o. female returns to the clinic today for a follow-up visit.  The patient was last seen by Dr. Julien Nordmann on 01/19/2022.  The patient has been on observation for several years. She had poor tolerance to chemotherapy in the past. The patient had a restaging CT scan last month that showed new area of nodularity around the bronchovascular structures with possible endobronchial extension there is also a small lymph node in the right hilum that is new compared to prior imaging and a new nodule in the medial right lower lobe concerning for additional site of disease.  There was some patchy areas of groundglass opacity in the upper lobes which in the context of known multifocal bronchogenic carcinoma does remain suspicious for additional sites of disease.  Therefore, Dr. Julien Nordmann recommends the patient have a  PET scan to further characterize this.  Since last being seen, the patient denies any major changes in her health.  In general, she has poor health related to her COPD. She is on 4 L of supplemental oxygen via nasal cannula. She has limitations in her activities of daily living due to her breathing and tachycardia. She denies any fever, chills, or night sweats.  She follows closely with Dr. Lamonte Sakai from pulmonary medicine.  Sees cardiology for tachycardia with minimal exertion.  She denies any hemoptysis.  She also has some chronic pain in her back and diaphragm/bloating. She has frequent baseline nausea/vomiting.  She typically has diarrhea but has had a little more constipation recently. Denies any headache or visual changes.  She is here today for evaluation to review her PET scan results and for more detailed discussion about her current condition and recommended treatment options.     MEDICAL HISTORY: Past Medical History:  Diagnosis Date   Anemia    "long time ago" (12/06/2012)   Anxiety    Arthritis    "right knee real bad; in my hands bad" (12/06/2012)   Asthma    Celiac disease    Colon polyps    COPD (chronic obstructive pulmonary disease) (HCC)    Coronary artery disease    a. s/p Xience DES to [facility] 04/2008;  b. LHC 05/2008: Proximal RCA 25%, mid RCA stent patent.  c. Anormal nuc 2014 -> s/p LHC with severe mRCA stenosis s/p DES. d. Cath 04/2013 s/p DES to LAD.  e. LHC 08/2015 was stable.  Depression    "years ago" (12/06/2012)   Fibromyalgia    Gallstones    GERD (gastroesophageal reflux disease)    H/O hiatal hernia    Hepatitis    "not A, B, or C" (12/06/2012)   Hyperlipidemia    intol to statins and other chol agents due to elevated LFTs   Hypertension    Hypothyroidism    IBS (irritable bowel syndrome)    Interstitial cystitis    Lung cancer ([place]) dx'd 01/2018   Lung nodules    Bilateral   Migraines    "when I was younger" (12/06/2012)   Pneumonia    PONV (postoperative  nausea and vomiting)    "Very bad"   Premature atrial contractions    PVC's (premature ventricular contractions)    Sinus headache    SVT (supraventricular tachycardia) (HCC)     ALLERGIES:  is allergic to ciprofloxacin, levofloxacin, statins, tricor [fenofibrate], compazine [prochlorperazine], latex, codeine, and metoprolol.  MEDICATIONS:  Current Outpatient Medications  Medication Sig Dispense Refill   albuterol (PROVENTIL) (5 MG/ML) 0.5% nebulizer solution Take 2.5 mg by nebulization every 6 (six) hours as needed for wheezing.     albuterol (VENTOLIN HFA) 108 (90 Base) MCG/ACT inhaler Inhale 1 puff into the lungs every 6 (six) hours as needed (wheezing/shortness of breath.).      aspirin 81 MG chewable tablet Chew 1 tablet (81 mg total) by mouth daily.     Budeson-Glycopyrrol-Formoterol (BREZTRI AEROSPHERE) 160-9-4.8 MCG/ACT AERO Inhale 2 puffs into the lungs in the morning and at bedtime. 10.7 g 5   buPROPion (WELLBUTRIN SR) 150 MG 12 hr tablet Take 150 mg by mouth daily.      ipratropium-albuterol (DUONEB) 0.5-2.5 (3) MG/3ML SOLN Inhale 3 mLs into the lungs every 6 (six) hours as needed (Asthma). 360 mL 3   LORazepam (ATIVAN) 2 MG tablet Take 2 mg by mouth every evening.     metoprolol tartrate (LOPRESSOR) 25 MG tablet TAKE 1/2 TABLET(12.5 MG) BY MOUTH TWICE DAILY 90 tablet 2   nitroGLYCERIN (NITROSTAT) 0.4 MG SL tablet Place 1 tablet (0.4 mg total) under the tongue every 5 (five) minutes as needed for chest pain. 25 tablet 12   ondansetron (ZOFRAN-ODT) 4 MG disintegrating tablet Take 1 tablet (4 mg total) by mouth every 8 (eight) hours as needed for nausea or vomiting. 30 tablet 2   Vitamin D, Ergocalciferol, (DRISDOL) 1.25 MG (50000 UT) CAPS capsule Take 50,000 Units by mouth every 7 (seven) days. Wednesdays     No current facility-administered medications for this visit.    SURGICAL HISTORY:  Past Surgical History:  Procedure Laterality Date   CARDIAC CATHETERIZATION  04/2008;  05/2008   CARDIAC CATHETERIZATION N/A 08/07/2015   Procedure: Left Heart Cath and Coronary Angiography;  Surgeon: Sherren Mocha, MD;  Location: [facility];  Service: Cardiovascular;  Laterality: N/A;   CHOLECYSTECTOMY     CORONARY ANGIOPLASTY WITH STENT PLACEMENT  04/2008 12/06/2012   "1 + 1" (12/06/2012)   CORONARY STENT PLACEMENT  05/08/2013   DES TO LAD      DR MCALHANY   IR IMAGING GUIDED PORT INSERTION  04/29/2018   LEFT HEART CATHETERIZATION WITH CORONARY ANGIOGRAM N/A 12/06/2012   Procedure: LEFT HEART CATHETERIZATION WITH CORONARY ANGIOGRAM;  Surgeon: [name], MD;  Location: [facility] CATH LAB;  Service: Cardiovascular;  Laterality: N/A;   LEFT HEART CATHETERIZATION WITH CORONARY ANGIOGRAM N/A 05/08/2013   Procedure: LEFT HEART CATHETERIZATION WITH CORONARY ANGIOGRAM;  Surgeon: [name], MD;  Location: [facility] CATH LAB;  Service: Cardiovascular;  Laterality: N/A;   LEFT HEART CATHETERIZATION WITH CORONARY ANGIOGRAM N/A 01/04/2014   Procedure: LEFT HEART CATHETERIZATION WITH CORONARY ANGIOGRAM;  Surgeon: [name], MD;  Location[facility] CATH LAB;  Service: Cardiovascular;  Laterality: N/A;   PERCUTANEOUS CORONARY STENT INTERVENTION (PCI-S)  12/06/2012   Procedure: PERCUTANEOUS CORONARY STENT INTERVENTION (PCI-S);  Surgeon: [name], MD;  Location: [facility] CATH LAB;  Service: Cardiovascular;;   PERCUTANEOUS CORONARY STENT INTERVENTION (PCI-S)  05/08/2013   Procedure: PERCUTANEOUS CORONARY STENT INTERVENTION (PCI-S);  Surgeon: [name], MD;  Location: [facility] CATH LAB;  Service: Cardiovascular;;  mid LAD    TUBAL LIGATION     VAGINAL HYSTERECTOMY     VIDEO BRONCHOSCOPY WITH ENDOBRONCHIAL NAVIGATION N/A 02/17/2018   Procedure: VIDEO BRONCHOSCOPY WITH ENDOBRONCHIAL NAVIGATION;  Surgeon: Melrose Nakayama, MD;  Location: [facility];  Service: Thoracic;  Laterality: N/A;    REVIEW OF SYSTEMS:   Review of Systems  Constitutional: Positive for chronic  fatigue. Negative for appetite change, chills, fever and unexpected weight change.  HENT: Negative for mouth sores, nosebleeds, sore throat and trouble swallowing.   Eyes: Negative for eye problems and icterus.  Respiratory: Positive for chronic baseline dyspnea with minimal exertion. Negative for cough, hemoptysis, and wheezing.   Cardiovascular: Negative for chest pain and leg swelling.  Gastrointestinal: Positive for abdominal bloating and nausea. Negative for constipation, diarrhea, and vomiting.  Genitourinary: Negative for bladder incontinence, difficulty urinating, dysuria, frequency and hematuria.   Musculoskeletal: Positive for chronic back pain. Negative for gait problem, neck pain and neck stiffness.  Skin: Negative for itching and rash.  Neurological: Negative for dizziness, extremity weakness, gait problem, headaches, light-headedness and seizures.  Hematological: Negative for adenopathy. Does not bruise/bleed easily.  Psychiatric/Behavioral: Negative for confusion, depression and sleep disturbance. The patient is not nervous/anxious.     PHYSICAL EXAMINATION:  Blood pressure (!) 129/97, pulse 87, temperature 97.9 F (36.6 C), temperature source Temporal, resp. rate 18, height _0  (1.702 m), weight 191 lb 11.2 oz (87 kg), SpO2 98 %.  ECOG PERFORMANCE STATUS: 2  Physical Exam  Constitutional: Oriented to person, place, and time and well-developed, well-nourished, and in no distress.  HENT:  Head: Normocephalic and atraumatic.  Mouth/Throat: Oropharynx is clear and moist. No oropharyngeal exudate.  Eyes: Conjunctivae are normal. Right eye exhibits no discharge. Left eye exhibits no discharge. No scleral icterus.  Neck: Normal range of motion. Neck supple.  Cardiovascular: Normal rate, regular rhythm, normal heart sounds and intact distal pulses.   Pulmonary/Chest: Effort normal. Quiet breath sounds bilaterally. No respiratory distress. No wheezes. No rales. On supplemental  oxygen.  Abdominal: Soft. Bowel sounds are normal. Exhibits no distension and no mass. There is no tenderness.  Musculoskeletal: Normal range of motion. Exhibits no edema.  Lymphadenopathy:    No cervical adenopathy.  Neurological: Alert and oriented to person, place, and time. Exhibits muscle wasting. Examined in the wheelchair.  Skin: Skin is warm and dry. No rash noted. Not diaphoretic. No erythema. No pallor.  Psychiatric: Mood, memory and judgment normal.  Vitals reviewed.  LABORATORY DATA: Lab Results  Component Value Date   WBC 9.7 01/15/2022   HGB 12.5 01/15/2022   HCT 37.7 01/15/2022   MCV 93.8 01/15/2022   PLT 264 01/15/2022      Chemistry      Component Value Date/Time   NA 142 01/15/2022 1350   NA 143 01/10/2018 1629   K 4.6 01/15/2022 1350  CL 109 01/15/2022 1350   CO2 27 01/15/2022 1350   BUN 21 01/15/2022 1350   BUN 14 01/10/2018 1629   CREATININE 1.45 (H) 01/15/2022 1350   CREATININE 0.68 08/05/2015 1456      Component Value Date/Time   CALCIUM 9.3 01/15/2022 1350   ALKPHOS 101 01/15/2022 1350   AST 36 01/15/2022 1350   ALT 40 01/15/2022 1350   BILITOT 0.6 01/15/2022 1350       RADIOGRAPHIC STUDIES:  NM PET Image Restage (PS) Skull Base to Thigh (F-18 FDG)  Result Date: 02/13/2022 CLINICAL DATA:  Subsequent treatment strategy for non-small cell lung cancer. EXAM: NUCLEAR MEDICINE PET SKULL BASE TO THIGH TECHNIQUE: 9.57 mCi F-18 FDG was injected intravenously. Full-ring PET imaging was performed from the skull base to thigh after the radiotracer. CT data was obtained and used for attenuation correction and anatomic localization. Fasting blood glucose: 149 mg/dl COMPARISON:  CT chest 01/15/2022 FINDINGS: Mediastinal blood pool activity: SUV max 3.44 Liver activity: SUV max NA NECK: There is a nodule in the right parotid gland measuring 1.2 cm and has an SUV max of 5.19, image 21/4. Formally this measured 1.1 cm with SUV max of 4.3. Incidental CT  findings: None CHEST: Corresponding to the area of progressive increased soft tissue within the right perihilar region is mild increased uptake with SUV max of 3.37, image 40/7. Cannot exclude underlying endobronchial lesion. Within the posterior right lower lobe there is new bronchial wall thickening, ground-glass and airspace consolidation with increased tracer uptake (SUV max equals 5.83), image 52/7. This is favored to represent an area of postinflammatory/infectious change, and may reflect postobstructive changes secondary to suspected central endobronchial lesion. Multiple lung nodules are again identified, including: -Within the posterolateral right upper lobe there is a mostly solid, tracer avid nodule measuring 1.7 cm with SUV max of 2.51, image 24/7. Unchanged from 01/15/2022. On the previous PET-CT from 01/12/2018 this measured 1 cm. -Index sub solid nodule within the left upper lobe measures 1.3 cm with SUV max of 2.51, image 119/7. -Index sub solid nodule within the superior segment of left lower lobe measures 2.9 cm and has an SUV max of 1.8, image 20/7. -Index sub solid nodule within the superior segment of left lower lobe measures 1 cm with SUV max of 1.37, image 16/7. -Index sub solid nodule within the anterior right upper lobe measures 8 mm with SUV max of 1.72 image 28/7. -Index sub solid nodule in the posterolateral left upper lobe measures 1.1 cm with SUV max of 1.22, image 29/7. -Solid nodule in the posteromedial right lower lobe is again seen. This measures 7 mm with SUV max of 1.12. Incidental CT findings: Centrilobular emphysema. Aortic atherosclerosis and coronary artery calcifications. ABDOMEN/PELVIS: No abnormal hypermetabolic activity within the liver, pancreas, adrenal glands, or spleen. No hypermetabolic lymph nodes in the abdomen or pelvis. Incidental CT findings: Aortic atherosclerosis. Cholecystectomy. Hepatic steatosis. SKELETON: No focal hypermetabolic activity to suggest skeletal  metastasis. Incidental CT findings: None. IMPRESSION: 1. Multiple bilateral solid and sub solid nodules are again identified. The largest and most FDG avid is within the posterior right upper lobe measuring 1.7 cm within SUV max of 2.5. Findings are suspicious for multifocal pulmonary adenocarcinoma. 2. There is mild increased tracer uptake corresponding to the recently characterized increased soft tissue within the right lower lobe perihilar region, SUV max equals 3.37. Since the recent CT of the chest from 01/15/2022 there are FDG avid postinflammatory changes within the posterior right lower lobe which  may reflect 3. As reported on the previous PET-CT from 2019, there is a tracer avid nodule within the right parotid gland with SUV max of 5.19. As mentioned previously this may represent a tracer avid lymph node or primary parotid tumor. 4. Hepatic steatosis. 5. Aortic Atherosclerosis (ICD10-I70.0) and Emphysema (ICD10-J43.9). Electronically Signed   By: Kerby Moors M.D.   On: 02/13/2022 17:14     ASSESSMENT/PLAN:  This is a very pleasant 66 year old Caucasian female diagnosed with stage IV non-small cell lung cancer, adenocarcinoma. She is negative for any actionable mutations and her PD-L1 expression is 1%. She was diagnosed in December 2019. She presented with bilateral pulmonary nodules.   She initially started treatment with carboplatin, Alimta, and Keytruda. She was status post 4 cycles but Keytruda was discontinued after cycle #2 due to his significant skin rash. She then was treated with maintenance single agent Alimta for 3 cycles. This has been on hold since June 2020 due to worsening renal insufficiency.  She has been on observation since that time and feeling fairly well except she does have significant COPD for which she is on supplemental oxygen. She follows closely with pulmonology.   In November 2023 the patient had a repeat CT scan which showed new area of nodularity with potential  endobronchial extension and lymph node adjacent to the endobronchial material in the right hilu that is suspicious and a new solid nodule in the medial right lower lobe concerning for additional site of disease.  Therefore, Dr. Julien Nordmann recommended PET scan.  The patient recently had a PET scan.  Dr. Julien Nordmann personally independently reviewed the scan and discussed the results with the patient today.  The scan showed evidence of disease progression with multiple bilateral solid and sub solid nodules are again Identified, the largest within the posterior right upper lobe measuring 1.7 cm.   Dr. Julien Nordmann had a lengthly discussion with the patient today about her current condition and treatment options. The patient was given the option of a referral to hospice/palliative vs. Treatment with systemic chemotherapy vs. Second line targeted treatment with [place] or Lumakras. She is interested in targeted treatment due to intolerance in the past with chemotherapy.   She had formal education with the oral chemotherapy pharmacist today. We will proceed with treatment with Alfred Levins.   We will arrange for her to meet with the oral chemotherapy pharmacist today. Her baseline EKG showed normal QT/Qtc of 381m/440 ms.   We discussed the adverse side effects of treatment including but not limited to fatigue, nausea, vomiting, diarrhea, and liver dysfunction.    The patient previously developed tardive dyskinesia symptoms related to frequent compazine use. She also mentions she frequently vomits anti-emetics up. I have sent zofran ODT to the pharmacy. We will have to monitor QT closely with KAlfred Levins   We will arrange for brain MRI to rule out metastatic disease to the brain.   We will see her back for a follow up visit in about 3 weeks for evaluation and repeat blood work and to manage any adverse side effects of treatment.   The patient was advised to call immediately if she has any concerning symptoms in the  interval. The patient voices understanding of current disease status and treatment options and is in agreement with the current care plan. All questions were answered. The patient knows to call the clinic with any problems, questions or concerns. We can certainly see the patient much sooner if necessary  Orders Placed This Encounter  Procedures   MR Brain W Wo Contrast    Standing Status:   Future    Standing Expiration Date:   02/16/2023    Order Specific Question:   If indicated for the ordered procedure, I authorize the administration of contrast media per Radiology protocol    Answer:   Yes    Order Specific Question:   What is the patient's sedation requirement?    Answer:   No Sedation    Order Specific Question:   Does the patient have a pacemaker or implanted devices?    Answer:   No    Order Specific Question:   Use SRS Protocol?    Answer:   No    Order Specific Question:   Preferred imaging location?    Answer:   [facility] (table limit - 550 lbs)   EKG 12-Lead      Chella Chapdelaine L Jacquelynn Friend, PA-C 02/16/22  ADDENDUM: Hematology/Oncology Attending:  I had a face-to-face encounter with the patient today.  I reviewed her records, lab, scan and recommended her care plan.  This is a very pleasant 66 years old white female with stage IV non-small cell lung cancer, adenocarcinoma diagnosed in December 2019 was molecular studies showed positive KRAS G12C mutation that was not actionable at that time. The patient started systemic chemotherapy with carboplatin, Alimta and Keytruda status post 4 cycles but Keytruda was discontinued and the patient continued with 3 more cycles of single agent Alimta discontinued secondary to intolerance.  She has been in observation for several years but recently her CT scan of the chest showed concerning finding for disease progression. The patient had a PET scan that confirmed the finding of disease progression.  I personally and  independently reviewed the PET scan images and discussed the result with the patient. I had a lengthy discussion with the patient today about her current condition and treatment options. I gave the patient the option of continuous monitoring and observation versus consideration of systemic chemotherapy with platinum based regimen versus consideration of treatment with Alfred Levins (Adagrasib) as a target therapy because of the positive KRAS G12C mutation.  The patient is interested in the treatment with the targeted therapy and she will be treated with Krazati (Adagrasib) 600 mg p.o. twice daily. I discussed with the patient the adverse effect of this treatment and she will meet with the pharmacist for oral oncolytic for more education and also to help the patient with the refill of her medication. We will see her back for follow-up visit in around 3 weeks for evaluation and management of any adverse effect of her treatment. The patient was advised to call immediately if she has any other concerning symptoms in the interval. The total time spent in the appointment was 30 minutes. Disclaimer: This note was dictated with voice recognition software. Similar sounding words can inadvertently be transcribed and may be missed upon review. Eilleen Kempf, MD

## 2022-02-16 ENCOUNTER — Other Ambulatory Visit: Payer: Self-pay

## 2022-02-16 ENCOUNTER — Telehealth: Payer: Self-pay | Admitting: Pharmacist

## 2022-02-16 ENCOUNTER — Other Ambulatory Visit (HOSPITAL_COMMUNITY): Payer: Self-pay

## 2022-02-16 ENCOUNTER — Inpatient Hospital Stay: Payer: Managed Care, Other (non HMO) | Attending: Physician Assistant | Admitting: Physician Assistant

## 2022-02-16 ENCOUNTER — Telehealth: Payer: Self-pay | Admitting: Pharmacy Technician

## 2022-02-16 VITALS — BP 129/97 | HR 87 | Temp 97.9°F | Resp 18 | Ht 67.0 in | Wt 191.7 lb

## 2022-02-16 DIAGNOSIS — I10 Essential (primary) hypertension: Secondary | ICD-10-CM | POA: Insufficient documentation

## 2022-02-16 DIAGNOSIS — C3492 Malignant neoplasm of unspecified part of left bronchus or lung: Secondary | ICD-10-CM | POA: Diagnosis present

## 2022-02-16 DIAGNOSIS — N289 Disorder of kidney and ureter, unspecified: Secondary | ICD-10-CM | POA: Diagnosis not present

## 2022-02-16 DIAGNOSIS — J4489 Other specified chronic obstructive pulmonary disease: Secondary | ICD-10-CM | POA: Diagnosis not present

## 2022-02-16 DIAGNOSIS — C3491 Malignant neoplasm of unspecified part of right bronchus or lung: Secondary | ICD-10-CM | POA: Diagnosis not present

## 2022-02-16 MED ORDER — ONDANSETRON 4 MG PO TBDP: 4.0000 mg | ORAL_TABLET | Freq: Three times a day (TID) | ORAL | 2 refills | Status: DC | PRN

## 2022-02-16 MED ORDER — KRAZATI 200 MG PO TABS
600.0000 mg | ORAL_TABLET | Freq: Two times a day (BID) | ORAL | 3 refills | Status: DC
  Filled 2022-02-16 – 2022-02-17 (×2): qty 180, 30d supply, fill #0

## 2022-02-16 NOTE — Telephone Encounter (Signed)
Oral Oncology Pharmacist Encounter  Received new prescription for Alfred Levins [facility]) for the treatment of metastatic non-small cell lung cancer, KRAS G12C mutated, planned duration until disease progression or unacceptable drug toxicity.  CMP and CBC w/ Diff from 02/16/22 assessed, noted patient with Scr of 1.45 mg/dL (CrCl ~51.7 mL/min) - no baseline renal dose adjustments required. Patient will have baseline EKG obtained in clinic on 02/16/22. Prescription dose and frequency assessed for appropriateness. Appropriate for therapy initiation.   Current medication list in Epic reviewed, DDIs with Nadara Mode identified: Category D drug-drug interaction between [place] and [employer] - Sparkill, a strong CYP3A4 inhibitor, can increase serum concentrations of budesonide. Recommend closely monitoring patient for increased corticosteroid ADEs while patient is on concomitant agents.   Category C drug-drug interaction between [place] and Lopressor - Krazati, a moderate CYP3D6 inhibitor may increase serum concentrations of Lopressor. Recommend monitoring patient's HR and BP. Patient with BP of 129/97 mmHg and HR of 87 BPM in clinic on 02/16/22.  Evaluated chart and no patient barriers to medication adherence noted.   Patient agreement for treatment documented in MD note on 02/16/22.  Prescription has been e-scribed to the [facility] for benefits analysis and approval.  Oral Oncology Clinic will continue to follow for insurance authorization, copayment issues, initial counseling and start date.  Leron Croak, PharmD, BCPS, [facility] Hematology/Oncology Clinical Pharmacist [facility] and [place] [phone] 02/16/2022 4:31 PM

## 2022-02-16 NOTE — Telephone Encounter (Signed)
Oral Oncology Patient Advocate Encounter   Received notification that prior authorization for [name] is required.   PA submitted on 02/16/22 Key [id number] Status is pending     Lady Deutscher, CPhT-Adv Oncology Pharmacy Patient [place] Direct Number: [phone]  Fax: [phone]

## 2022-02-16 NOTE — Progress Notes (Signed)
DISCONTINUE ON PATHWAY REGIMEN - Non-Small Cell Lung     A cycle is every 21 days:     Pembrolizumab      Pemetrexed      Carboplatin   **Always confirm dose/schedule in your pharmacy ordering system**  REASON: Disease Progression PRIOR TREATMENT: [id number]: Pembrolizumab 200 mg + Pemetrexed 500 mg/m2 + Carboplatin AUC=5 q21 Days x 4-6 Cycles TREATMENT RESPONSE: Stable Disease (SD)  START OFF PATHWAY REGIMEN - Non-Small Cell Lung   [id number]:Adagrasib 600 mg PO BID D1-28 q28 Days:   A cycle is every 28 days:     Adagrasib   **Always confirm dose/schedule in your pharmacy ordering system**  Patient Characteristics: Stage IV Metastatic, Nonsquamous, Molecular Analysis Completed, Molecular Alteration Present and Eligible for Molecular Targeted Therapy, Second Line - Molecular Targeted Therapy, KRAS G12C Mutation Positive Therapeutic Status: Stage IV Metastatic Histology: Nonsquamous Cell Broad Molecular Profiling Status: Molecular Analysis Completed Molecular Analysis Results: Alteration Present and Eligible for Molecular Targeted Therapy Molecular Alteration Present: KRAS G12C Mutation Positive Molecular Targeted Line of Therapy: [facility] visitor Targeted Therapy Intent of Therapy: Non-Curative / Palliative Intent, Discussed with Patient

## 2022-02-17 ENCOUNTER — Other Ambulatory Visit (HOSPITAL_COMMUNITY): Payer: Self-pay

## 2022-02-17 ENCOUNTER — Encounter: Payer: Self-pay | Admitting: Internal Medicine

## 2022-02-17 ENCOUNTER — Other Ambulatory Visit: Payer: Self-pay

## 2022-02-17 NOTE — Telephone Encounter (Signed)
Oral Oncology Patient Advocate Encounter  Prior Authorization for [name] has been approved.    PA# [id number] Effective dates: 02/17/22 through 08/16/22  Patients co-pay is $10.35.    [name], CPhT-Adv Oncology Pharmacy Patient [place] Direct Number: [phone]  Fax: [phone]

## 2022-02-19 ENCOUNTER — Telehealth: Payer: Self-pay | Admitting: Physician Assistant

## 2022-02-19 NOTE — Telephone Encounter (Signed)
Called patient regarding January appointments, left a voicemail.

## 2022-02-20 ENCOUNTER — Other Ambulatory Visit (HOSPITAL_COMMUNITY): Payer: Self-pay

## 2022-03-03 ENCOUNTER — Other Ambulatory Visit (HOSPITAL_COMMUNITY): Payer: Self-pay

## 2022-03-03 ENCOUNTER — Other Ambulatory Visit: Payer: Self-pay | Admitting: Medical Oncology

## 2022-03-03 ENCOUNTER — Telehealth: Payer: Self-pay | Admitting: Medical Oncology

## 2022-03-03 ENCOUNTER — Telehealth: Payer: Self-pay

## 2022-03-03 ENCOUNTER — Telehealth: Payer: Self-pay | Admitting: Internal Medicine

## 2022-03-03 DIAGNOSIS — C3490 Malignant neoplasm of unspecified part of unspecified bronchus or lung: Secondary | ICD-10-CM

## 2022-03-03 DIAGNOSIS — C3492 Malignant neoplasm of unspecified part of left bronchus or lung: Secondary | ICD-10-CM

## 2022-03-03 DIAGNOSIS — F40240 Claustrophobia: Secondary | ICD-10-CM

## 2022-03-03 MED ORDER — ALPRAZOLAM 0.25 MG PO TABS: 0.2500 mg | ORAL_TABLET | Freq: Every evening | ORAL | 0 refills | Status: DC | PRN

## 2022-03-03 NOTE — Telephone Encounter (Signed)
N/V "so bad. Zofran is not helping She started new drug [place] two weeks ago. She cannot eat or drink much .   Per Dr. Julien Nordmann , I offered IVF today ,but she can't come due to transportation. She can come tomorrow. Schedule request sent.

## 2022-03-03 NOTE — Telephone Encounter (Signed)
Nausea and vomiting/-started 5 days ago and each day it is getting worse . She started [place] 12 days ago . Vomiting twice daily. Cannot eat or drink a whole lot at any time. Mouth feels dry and she is eating ice chips. Zofran ODT is not helping .  Diarrhea- daily - just started Sunday . I instructed her to take Imodium .  MRI on Friday and has Claustrophobia-  xanax works better than ativan. She declined IVF fluids today and said maybe tomorrow because she will have transportation. Schedule message sent.   Pt said she can come for IVF tomorrow.

## 2022-03-03 NOTE — Telephone Encounter (Signed)
Scheduled appointment per 1/2 scheduling message. Patient is aware of the made appointments.

## 2022-03-03 NOTE — Telephone Encounter (Signed)
Attempted to call pt to schedule [facility] visit per Dr.Mohammed, no answer, LVM

## 2022-03-03 NOTE — Progress Notes (Addendum)
Scheduled for IVF tomorrow.

## 2022-03-04 ENCOUNTER — Inpatient Hospital Stay: Payer: Managed Care, Other (non HMO)

## 2022-03-04 ENCOUNTER — Telehealth: Payer: Self-pay | Admitting: Medical Oncology

## 2022-03-04 ENCOUNTER — Inpatient Hospital Stay: Payer: Managed Care, Other (non HMO) | Admitting: Internal Medicine

## 2022-03-04 ENCOUNTER — Encounter: Payer: Self-pay | Admitting: Internal Medicine

## 2022-03-04 NOTE — Telephone Encounter (Signed)
Dose reduction-Per Dr Julien Nordmann I instructed pt to take [name] 2 tablets BID instead of 3 tablets bid and continue zofran. Call back if symptoms persist or get worse ( cough,N/V) She is drinking gatorade and ice chips. Her voice sounds strong.

## 2022-03-04 NOTE — Telephone Encounter (Signed)
Pt stated she has a new cough-

## 2022-03-06 ENCOUNTER — Ambulatory Visit (HOSPITAL_COMMUNITY)
Admission: RE | Admit: 2022-03-06 | Discharge: 2022-03-06 | Disposition: A | Discharge status: Home / Self Care | Payer: Managed Care, Other (non HMO) | Source: Ambulatory Visit | Attending: Physician Assistant

## 2022-03-06 DIAGNOSIS — C3492 Malignant neoplasm of unspecified part of left bronchus or lung: Secondary | ICD-10-CM | POA: Diagnosis not present

## 2022-03-06 MED ORDER — GADOBUTROL 1 MMOL/ML IV SOLN
8.5000 mL | Freq: Once | INTRAVENOUS | Status: AC | PRN
  Administered 2022-03-06: 8.5 mL via INTRAVENOUS

## 2022-03-09 ENCOUNTER — Telehealth: Payer: Self-pay | Admitting: Physician Assistant

## 2022-03-09 NOTE — Progress Notes (Unsigned)
[facility] OFFICE PROGRESS NOTE  [name], NP [place] [address]  DIAGNOSIS: Stage IV (T1a, N0, M1 a) non-small cell lung cancer, adenocarcinoma presented with multifocal disease in both lungs including 3 nodules in the left lung as well as 2 nodules in the right lung diagnosed in December 2019.      Molecular studies showed: KRAS G12C BCOR [id number]-subclonal RAD21 amplification   PDL 1 expression 1%.   PRIOR THERAPY: 1) Systemic chemotherapy with carboplatin for AUC of 5, Alimta 500 mg/M2 and Keytruda 200 mg IV every 3 weeks.  First dose April 12, 2018. Status post 4 cycles. starting from cycle #3, Keytruda was dropped and patient received treatment with Carboplatin for an AUC of 5 and Alimta 500 mg/m2 IV every 3 weeks.  2) Systemic chemotherapy with Alimta 500 mg/m2 IV every 3 weeks. Status post 3 cycles of single agent Alimta. Most recent dose given on 08/23/2018.  The treatment is currently on hold secondary to renal insufficiency  CURRENT THERAPY: Krazati 600 mg BID, first dose on 02/20/22   INTERVAL HISTORY: [name] 67 y.o. female returns to the clinic today for a follow-up visit.  The patient was last seen by Dr. Julien Nordmann and myself on 02/16/2022.  The patient had been off treatment for several years.  She had poor tolerance to chemotherapy in the past.  Unfortunately, the patient was recently found to have some new nodularity around the bronchovascular structures with possible endobronchial extension a few small lymph nodes in the right hilum and a new nodule in the medial right lower lobe concerning for additional site of disease. She had a PET scan to further evaluate this.  Due to the disease progression, she was given several options at her last appointment.  Due to the patient's history of intolerance to chemotherapy, she was started on second line targeted treatment with Alfred Levins for which she started on 02/20/22 .  Even prior  to [place] and at her last appointment she was having a lot of symptoms related to significant limitations with dyspnea on exertion and tachycardia and nausea, vomiting, bloating.  She continues to experience the symptoms and has call the clinic in the interval since last being seen and states the symptoms had worsened.   She was having worsening nausea, vomiting, diarrhea, and cough/shortness of breath.  Dr. Julien Nordmann instructed the patient to reduce her dose to 400 mg BID, which she started on 03/03/22.  Since reducing the dose of Krazati, this did help her nausea and vomiting.  She started taking Imodium yesterday and her bowels have returned to normal.  She does report she feels dehydrated as her mouth is dry and she is feeling very thirsty.  She has been sucking on ice chips.  She denies any fevers but reports this morning she had an episode of chills.  She is also been more short of breath and coughing more.  This started around 03/07/2022.  She thinks she may have gotten sick when she got her brain MRI because she otherwise does not leave the house denies any sick contacts/exposures.  She reports she has been coughing a lot and it has been interfering with her ability to sleep.  She has baseline dyspnea on exertion with minimal exertion for which she is already on supplemental oxygen via nasal cannula.  It appears on her labs today she has some new anemia.  She denies any abnormal bleeding or bruising except she reports she has been  coughing up blood-tinged sputum a few times since yesterday.  She is not on a blood thinner.  She is not taking aspirin.  She feels that the blood is coming from her lungs as she denies any epistaxis.  Denies any fever.  Denies any rashes or skin changes.  Denies any headache or visual changes.  She had her staging brain MRI which was negative for metastatic disease to the brain.  She is here today for evaluation repeat blood work.    MEDICAL HISTORY: Past Medical History:   Diagnosis Date   Anemia    "long time ago" (12/06/2012)   Anxiety    Arthritis    "right knee real bad; in my hands bad" (12/06/2012)   Asthma    Celiac disease    Colon polyps    COPD (chronic obstructive pulmonary disease) (HCC)    Coronary artery disease    a. s/p Xience DES to [facility] 04/2008;  b. LHC 05/2008: Proximal RCA 25%, mid RCA stent patent.  c. Anormal nuc 2014 -> s/p LHC with severe mRCA stenosis s/p DES. d. Cath 04/2013 s/p DES to LAD.  e. LHC 08/2015 was stable.   Depression    "years ago" (12/06/2012)   Fibromyalgia    Gallstones    GERD (gastroesophageal reflux disease)    H/O hiatal hernia    Hepatitis    "not A, B, or C" (12/06/2012)   Hyperlipidemia    intol to statins and other chol agents due to elevated LFTs   Hypertension    Hypothyroidism    IBS (irritable bowel syndrome)    Interstitial cystitis    Lung cancer ([place]) dx'd 01/2018   Lung nodules    Bilateral   Migraines    "when I was younger" (12/06/2012)   Pneumonia    PONV (postoperative nausea and vomiting)    "Very bad"   Premature atrial contractions    PVC's (premature ventricular contractions)    Sinus headache    SVT (supraventricular tachycardia) (HCC)     ALLERGIES:  is allergic to ciprofloxacin, levofloxacin, statins, tricor [fenofibrate], compazine [prochlorperazine], latex, codeine, and metoprolol.  MEDICATIONS:  Current Outpatient Medications  Medication Sig Dispense Refill   adagrasib (KRAZATI) 200 MG tablet Take 3 tablets (600 mg total) by mouth 2 (two) times daily. 180 tablet 3   albuterol (PROVENTIL) (5 MG/ML) 0.5% nebulizer solution Take 2.5 mg by nebulization every 6 (six) hours as needed for wheezing.     albuterol (VENTOLIN HFA) 108 (90 Base) MCG/ACT inhaler Inhale 1 puff into the lungs every 6 (six) hours as needed (wheezing/shortness of breath.).      ALPRAZolam (XANAX) 0.25 MG tablet Take 1 tablet (0.25 mg total) by mouth at bedtime as needed for anxiety. Take 1 tablets 30  minutes prior to MRI. Have a driver. 1 tablet 0   aspirin 81 MG chewable tablet Chew 1 tablet (81 mg total) by mouth daily.     Budeson-Glycopyrrol-Formoterol (BREZTRI AEROSPHERE) 160-9-4.8 MCG/ACT AERO Inhale 2 puffs into the lungs in the morning and at bedtime. 10.7 g 5   buPROPion (WELLBUTRIN SR) 150 MG 12 hr tablet Take 150 mg by mouth daily.      ipratropium-albuterol (DUONEB) 0.5-2.5 (3) MG/3ML SOLN Inhale 3 mLs into the lungs every 6 (six) hours as needed (Asthma). 360 mL 3   LORazepam (ATIVAN) 2 MG tablet Take 2 mg by mouth every evening.     metoprolol tartrate (LOPRESSOR) 25 MG tablet TAKE 1/2 TABLET(12.5 MG) BY MOUTH  TWICE DAILY 90 tablet 2   nitroGLYCERIN (NITROSTAT) 0.4 MG SL tablet Place 1 tablet (0.4 mg total) under the tongue every 5 (five) minutes as needed for chest pain. 25 tablet 12   ondansetron (ZOFRAN-ODT) 4 MG disintegrating tablet Take 1 tablet (4 mg total) by mouth every 8 (eight) hours as needed for nausea or vomiting. 30 tablet 2   Vitamin D, Ergocalciferol, (DRISDOL) 1.25 MG (50000 UT) CAPS capsule Take 50,000 Units by mouth every 7 (seven) days. Wednesdays     No current facility-administered medications for this visit.    SURGICAL HISTORY:  Past Surgical History:  Procedure Laterality Date   CARDIAC CATHETERIZATION  04/2008; 05/2008   CARDIAC CATHETERIZATION N/A 08/07/2015   Procedure: Left Heart Cath and Coronary Angiography;  Surgeon: Sherren Mocha, MD;  Location: [facility] CV LAB;  Service: Cardiovascular;  Laterality: N/A;   CHOLECYSTECTOMY     CORONARY ANGIOPLASTY WITH STENT PLACEMENT  04/2008 12/06/2012   "1 + 1" (12/06/2012)   CORONARY STENT PLACEMENT  05/08/2013   DES TO LAD      DR MCALHANY   IR IMAGING GUIDED PORT INSERTION  04/29/2018   LEFT HEART CATHETERIZATION WITH CORONARY ANGIOGRAM N/A 12/06/2012   Procedure: LEFT HEART CATHETERIZATION WITH CORONARY ANGIOGRAM;  Surgeon: [name], MD;  Location: [facility] CATH LAB;  Service: Cardiovascular;   Laterality: N/A;   LEFT HEART CATHETERIZATION WITH CORONARY ANGIOGRAM N/A 05/08/2013   Procedure: LEFT HEART CATHETERIZATION WITH CORONARY ANGIOGRAM;  Surgeon: [name], MD;  Location: [facility] CATH LAB;  Service: Cardiovascular;  Laterality: N/A;   LEFT HEART CATHETERIZATION WITH CORONARY ANGIOGRAM N/A 01/04/2014   Procedure: LEFT HEART CATHETERIZATION WITH CORONARY ANGIOGRAM;  Surgeon: [name], MD;  Location: [facility] CATH LAB;  Service: Cardiovascular;  Laterality: N/A;   PERCUTANEOUS CORONARY STENT INTERVENTION (PCI-S)  12/06/2012   Procedure: PERCUTANEOUS CORONARY STENT INTERVENTION (PCI-S);  Surgeon: [name], MD;  Location: [facility] CATH LAB;  Service: Cardiovascular;;   PERCUTANEOUS CORONARY STENT INTERVENTION (PCI-S)  05/08/2013   Procedure: PERCUTANEOUS CORONARY STENT INTERVENTION (PCI-S);  Surgeon: [name], MD;  Location: [facility] CATH LAB;  Service: Cardiovascular;;  mid LAD    TUBAL LIGATION     VAGINAL HYSTERECTOMY     VIDEO BRONCHOSCOPY WITH ENDOBRONCHIAL NAVIGATION N/A 02/17/2018   Procedure: VIDEO BRONCHOSCOPY WITH ENDOBRONCHIAL NAVIGATION;  Surgeon: Melrose Nakayama, MD;  Location: [facility];  Service: Thoracic;  Laterality: N/A;    REVIEW OF SYSTEMS:   Review of Systems  Constitutional: Positive for chronic fatigue. Negative for appetite change, chills, fever and unexpected weight change.  HENT: Negative for mouth sores, nosebleeds, sore throat and trouble swallowing.   Eyes: Negative for eye problems and icterus.  Respiratory: Positive for chronic baseline dyspnea with minimal exertion and positive for new/worsening cough.  Positive for blood-tinged mucus/sputum.  Negative for   wheezing.   Cardiovascular: Negative for chest pain and leg swelling.  Gastrointestinal: Improved nausea, vomiting, diarrhea.  Negative for constipation, diarrhea, and vomiting.  Genitourinary: Negative for bladder incontinence, difficulty urinating, dysuria, frequency  and hematuria.   Musculoskeletal: Positive for chronic back pain. Negative for gait problem, neck pain and neck stiffness.  Skin: Negative for itching and rash.  Neurological: Negative for dizziness, extremity weakness, gait problem, headaches, light-headedness and seizures.  Hematological: Negative for adenopathy. Does not bruise/bleed easily.  Psychiatric/Behavioral: Negative for confusion, depression and sleep disturbance. The patient is not nervous/anxious.    PHYSICAL EXAMINATION:  There were no vitals taken for this  visit.  ECOG PERFORMANCE STATUS: 2  Physical Exam  Constitutional: Oriented to person, place, and time and well-developed, well-nourished, and in no distress.  HENT:  Head: Normocephalic and atraumatic.  Mouth/Throat: Oropharynx is clear and moist. No oropharyngeal exudate.  Eyes: Conjunctivae are normal. Right eye exhibits no discharge. Left eye exhibits no discharge. No scleral icterus.  Neck: Normal range of motion. Neck supple.  Cardiovascular: Normal rate, regular rhythm, normal heart sounds and intact distal pulses.   Pulmonary/Chest: Effort normal. Quiet breath sounds bilaterally with some rhonchi/wheezing. No respiratory distress. No rales. On supplemental oxygen.  Abdominal: Soft. Bowel sounds are normal. Exhibits no distension and no mass. There is no tenderness.  Musculoskeletal: Normal range of motion. Exhibits no edema.  Lymphadenopathy:    No cervical adenopathy.  Neurological: Alert and oriented to person, place, and time. Exhibits muscle wasting. Examined in the wheelchair.  Skin: Skin is warm and dry. No rash noted. Not diaphoretic. No erythema. No pallor.  Psychiatric: Mood, memory and judgment normal.  Vitals reviewed.  LABORATORY DATA: Lab Results  Component Value Date   WBC 9.7 01/15/2022   HGB 12.5 01/15/2022   HCT 37.7 01/15/2022   MCV 93.8 01/15/2022   PLT 264 01/15/2022      Chemistry      Component Value Date/Time   NA 142  01/15/2022 1350   NA 143 01/10/2018 1629   K 4.6 01/15/2022 1350   CL 109 01/15/2022 1350   CO2 27 01/15/2022 1350   BUN 21 01/15/2022 1350   BUN 14 01/10/2018 1629   CREATININE 1.45 (H) 01/15/2022 1350   CREATININE 0.68 08/05/2015 1456      Component Value Date/Time   CALCIUM 9.3 01/15/2022 1350   ALKPHOS 101 01/15/2022 1350   AST 36 01/15/2022 1350   ALT 40 01/15/2022 1350   BILITOT 0.6 01/15/2022 1350       RADIOGRAPHIC STUDIES:  MR Brain W Wo Contrast  Result Date: 03/09/2022 CLINICAL DATA:  Assess for metastatic disease. EXAM: MRI HEAD WITHOUT AND WITH CONTRAST TECHNIQUE: Multiplanar, multiecho pulse sequences of the brain and surrounding structures were obtained without and with intravenous contrast. CONTRAST:  8.34mL GADAVIST GADOBUTROL 1 MMOL/ML IV SOLN COMPARISON:  01/16/20 MRI Brain FINDINGS: Brain: No acute infarction, hemorrhage, hydrocephalus, extra-axial collection or mass lesion. Scattered T2/FLAIR hyperintense periventricular lesions, favored to represent sequela of chronic microvascular ischemic change. No evidence of intracranial metastatic disease. Vascular: Normal flow voids. Skull and upper cervical spine: Normal marrow signal. Sinuses/Orbits: Polypoid mucosal thickening left maxillary sinus. Paranasal sinuses are otherwise clear. No middle ear or mastoid effusion. Orbits are unremarkable. Other: None IMPRESSION: No evidence of intracranial metastatic disease or acute intracranial process. Electronically Signed   By: Marin Roberts M.D.   On: 03/09/2022 10:17   NM PET Image Restage (PS) Skull Base to Thigh (F-18 FDG)  Result Date: 02/13/2022 CLINICAL DATA:  Subsequent treatment strategy for non-small cell lung cancer. EXAM: NUCLEAR MEDICINE PET SKULL BASE TO THIGH TECHNIQUE: 9.57 mCi F-18 FDG was injected intravenously. Full-ring PET imaging was performed from the skull base to thigh after the radiotracer. CT data was obtained and used for attenuation correction and  anatomic localization. Fasting blood glucose: 149 mg/dl COMPARISON:  CT chest 01/15/2022 FINDINGS: Mediastinal blood pool activity: SUV max 3.44 Liver activity: SUV max NA NECK: There is a nodule in the right parotid gland measuring 1.2 cm and has an SUV max of 5.19, image 21/4. Formally this measured 1.1 cm with SUV max of  4.3. Incidental CT findings: None CHEST: Corresponding to the area of progressive increased soft tissue within the right perihilar region is mild increased uptake with SUV max of 3.37, image 40/7. Cannot exclude underlying endobronchial lesion. Within the posterior right lower lobe there is new bronchial wall thickening, ground-glass and airspace consolidation with increased tracer uptake (SUV max equals 5.83), image 52/7. This is favored to represent an area of postinflammatory/infectious change, and may reflect postobstructive changes secondary to suspected central endobronchial lesion. Multiple lung nodules are again identified, including: -Within the posterolateral right upper lobe there is a mostly solid, tracer avid nodule measuring 1.7 cm with SUV max of 2.51, image 24/7. Unchanged from 01/15/2022. On the previous PET-CT from 01/12/2018 this measured 1 cm. -Index sub solid nodule within the left upper lobe measures 1.3 cm with SUV max of 2.51, image 119/7. -Index sub solid nodule within the superior segment of left lower lobe measures 2.9 cm and has an SUV max of 1.8, image 20/7. -Index sub solid nodule within the superior segment of left lower lobe measures 1 cm with SUV max of 1.37, image 16/7. -Index sub solid nodule within the anterior right upper lobe measures 8 mm with SUV max of 1.72 image 28/7. -Index sub solid nodule in the posterolateral left upper lobe measures 1.1 cm with SUV max of 1.22, image 29/7. -Solid nodule in the posteromedial right lower lobe is again seen. This measures 7 mm with SUV max of 1.12. Incidental CT findings: Centrilobular emphysema. Aortic atherosclerosis  and coronary artery calcifications. ABDOMEN/PELVIS: No abnormal hypermetabolic activity within the liver, pancreas, adrenal glands, or spleen. No hypermetabolic lymph nodes in the abdomen or pelvis. Incidental CT findings: Aortic atherosclerosis. Cholecystectomy. Hepatic steatosis. SKELETON: No focal hypermetabolic activity to suggest skeletal metastasis. Incidental CT findings: None. IMPRESSION: 1. Multiple bilateral solid and sub solid nodules are again identified. The largest and most FDG avid is within the posterior right upper lobe measuring 1.7 cm within SUV max of 2.5. Findings are suspicious for multifocal pulmonary adenocarcinoma. 2. There is mild increased tracer uptake corresponding to the recently characterized increased soft tissue within the right lower lobe perihilar region, SUV max equals 3.37. Since the recent CT of the chest from 01/15/2022 there are FDG avid postinflammatory changes within the posterior right lower lobe which may reflect 3. As reported on the previous PET-CT from 2019, there is a tracer avid nodule within the right parotid gland with SUV max of 5.19. As mentioned previously this may represent a tracer avid lymph node or primary parotid tumor. 4. Hepatic steatosis. 5. Aortic Atherosclerosis (ICD10-I70.0) and Emphysema (ICD10-J43.9). Electronically Signed   By: Kerby Moors M.D.   On: 02/13/2022 17:14     ASSESSMENT/PLAN:  This is a very pleasant 67 year old Caucasian female diagnosed with stage IV non-small cell lung cancer, adenocarcinoma. She is negative for any actionable mutations and her PD-L1 expression is 1%. She was diagnosed in December 2019. She presented with bilateral pulmonary nodules.    She initially started treatment with carboplatin, Alimta, and Keytruda. She was status post 4 cycles but Keytruda was discontinued after cycle #2 due to his significant skin rash. She then was treated with maintenance single agent Alimta for 3 cycles. This has been on hold  since June 2020 due to worsening renal insufficiency.  She has been on observation since that time and feeling fairly well except she does have significant COPD for which she is on supplemental oxygen. She follows closely with pulmonology.  In November 2023 the patient had a repeat CT scan which showed new area of nodularity with potential endobronchial extension and lymph node adjacent to the endobronchial material in the right hilu that is suspicious and a new solid nodule in the medial right lower lobe concerning for additional site of disease.  She had a PET scan which showed evidence of disease progression with multiple bilateral solid and subsolid nodules.  Due to the patient's history of intolerance to chemotherapy she was started on second line targeted treatment with Krazati 600 mg twice daily.  Her first dose was on 02/20/22.  Dose was reduced to 400 mg twice daily around 03/03/2022 due to ongoing symptoms of nausea, vomiting, and diarrhea.  The patient was seen with Dr. Julien Nordmann today.  The patient's vitals are within normal limits.  She continues to be on 4 L of supplemental oxygen. Dr. Julien Nordmann recommends sending a medrol dose pack and abx. She is allergic to Levaquin.  We will send her prescription for doxycycline to take 100 mg twice daily for 10 days.  We reviewed strict emergency room precautions. Dr. Julien Nordmann discussed with the patient develops any new or worsening symptoms in the interval such as fever, worsening cough, hemoptysis worsening shortness of breath, chest pain, chills, etc. to seek emergency room evaluation.  Labs today show dehydration and elevated creatinine from her baseline.  We will arrange for her to receive 1 L of fluid in the infusion room today.  She was also encouraged to hydrate at home.  The patient's diarrhea has improved with Imodium.  Her nausea vomiting has improved with the dose reduction of Krazati.  We will arrange for short interval follow-up next week  with labs and repeat evaluation.  She will continue on dose reduced Krazati 400 mg twice daily for now.  The patient was advised to call immediately if she has any concerning symptoms in the interval. The patient voices understanding of current disease status and treatment options and is in agreement with the current care plan. All questions were answered. The patient knows to call the clinic with any problems, questions or concerns. We can certainly see the patient much sooner if necessary   No orders of the defined types were placed in this encounter.     Diana Havrilla L Coralynn Gaona, PA-C 03/09/22  ADDENDUM: Hematology/Oncology Attending: I had a face-to-face encounter with the patient today.  I reviewed her records, lab and recommended her care plan.  This is a very pleasant 67 years old white female with a stage IV non-small cell lung cancer, adenocarcinoma with positive KRAS G12C mutation and PD-L1 expression of 1%.  This was diagnosed in December 2019.  She started systemic chemotherapy at that time with carboplatin, Alimta and Keytruda for 4 cycles followed by maintenance treatment with single agent Alimta for 3 more cycles but this was again discontinued secondary to intolerance. The patient had been on observation for several years before recent imaging studies showed evidence for disease recurrence and progression. She started treatment with Alfred Levins (Adagrasib) initially 600 mg p.o. twice daily on February 20, 2022.  She has some intolerance to [employer]) with nausea vomiting as well as bloating and tachycardia.  We reduced her dose to 400 mg p.o. twice daily and she has been tolerating this better.  She presented today with shortness of breath as well as cough with blood-tinged sputum and clots.  She also has lack of appetite and diarrhea.  She was not receiving enough hydration recently.  She  had MRI of the brain performed recently that was negative for brain metastasis but she  felt some chest congestion and increased cough after the MRI. I had a lengthy discussion with the patient today about her current condition and treatment options. I recommended for the patient to continue her current treatment with Alfred Levins (Adagrasib) at a dose of 400 mg p.o. twice daily. For the cough with blood-tinged sputum, this is likely inflammatory in origin since her previous scan showed no clear progressive central tumor.  We will start the patient on Medrol Dosepak in addition to doxycycline 100 mg p.o. twice daily but she was advised if she has worsening of her condition and more hemoptysis is to go immediately to the emergency department for evaluation. For the dehydration, diarrhea and renal insufficiency, we will arrange for the patient to receive 1 L of normal saline in the clinic today. We will see the patient back for follow-up visit in 1 week for evaluation and repeat blood work but she was strongly advised to go to the emergency room if she has any worsening symptoms in the interval. The total time spent in the appointment was 30 minutes. Disclaimer: This note was dictated with voice recognition software. Similar sounding words can inadvertently be transcribed and may be missed upon review. Eilleen Kempf, MD

## 2022-03-09 NOTE — Telephone Encounter (Signed)
Called patient regarding upcoming January appointments, patient is notified. 

## 2022-03-10 ENCOUNTER — Inpatient Hospital Stay: Payer: Managed Care, Other (non HMO)

## 2022-03-10 ENCOUNTER — Other Ambulatory Visit: Payer: Self-pay | Admitting: Internal Medicine

## 2022-03-10 ENCOUNTER — Inpatient Hospital Stay: Payer: Managed Care, Other (non HMO) | Attending: Physician Assistant

## 2022-03-10 ENCOUNTER — Other Ambulatory Visit (HOSPITAL_COMMUNITY): Payer: Self-pay

## 2022-03-10 ENCOUNTER — Other Ambulatory Visit: Payer: Self-pay

## 2022-03-10 ENCOUNTER — Inpatient Hospital Stay: Payer: Managed Care, Other (non HMO) | Admitting: Physician Assistant

## 2022-03-10 VITALS — BP 140/86 | HR 85 | Temp 98.3°F | Resp 15 | Wt 188.5 lb

## 2022-03-10 DIAGNOSIS — J4489 Other specified chronic obstructive pulmonary disease: Secondary | ICD-10-CM | POA: Insufficient documentation

## 2022-03-10 DIAGNOSIS — R7989 Other specified abnormal findings of blood chemistry: Secondary | ICD-10-CM | POA: Insufficient documentation

## 2022-03-10 DIAGNOSIS — I10 Essential (primary) hypertension: Secondary | ICD-10-CM | POA: Insufficient documentation

## 2022-03-10 DIAGNOSIS — C3492 Malignant neoplasm of unspecified part of left bronchus or lung: Secondary | ICD-10-CM

## 2022-03-10 DIAGNOSIS — R042 Hemoptysis: Secondary | ICD-10-CM | POA: Diagnosis not present

## 2022-03-10 DIAGNOSIS — D649 Anemia, unspecified: Secondary | ICD-10-CM | POA: Diagnosis not present

## 2022-03-10 DIAGNOSIS — J432 Centrilobular emphysema: Secondary | ICD-10-CM | POA: Insufficient documentation

## 2022-03-10 DIAGNOSIS — E860 Dehydration: Secondary | ICD-10-CM

## 2022-03-10 DIAGNOSIS — Z95828 Presence of other vascular implants and grafts: Secondary | ICD-10-CM

## 2022-03-10 DIAGNOSIS — N289 Disorder of kidney and ureter, unspecified: Secondary | ICD-10-CM | POA: Insufficient documentation

## 2022-03-10 DIAGNOSIS — R112 Nausea with vomiting, unspecified: Secondary | ICD-10-CM | POA: Diagnosis not present

## 2022-03-10 DIAGNOSIS — C3490 Malignant neoplasm of unspecified part of unspecified bronchus or lung: Secondary | ICD-10-CM

## 2022-03-10 LAB — CMP (CANCER CENTER ONLY)
ALT: 39 U/L (ref 0–44)
AST: 13 U/L — ABNORMAL LOW (ref 15–41)
Albumin: 3.8 g/dL (ref 3.5–5.0)
Alkaline Phosphatase: 125 U/L (ref 38–126)
Anion gap: 7 (ref 5–15)
BUN: 25 mg/dL — ABNORMAL HIGH (ref 8–23)
CO2: 24 mmol/L (ref 22–32)
Calcium: 8.7 mg/dL — ABNORMAL LOW (ref 8.9–10.3)
Chloride: 106 mmol/L (ref 98–111)
Creatinine: 2.03 mg/dL — ABNORMAL HIGH (ref 0.44–1.00)
GFR, Estimated: 26 mL/min — ABNORMAL LOW (ref >60)
Glucose, Bld: 292 mg/dL — ABNORMAL HIGH (ref 70–99)
Potassium: 4.6 mmol/L (ref 3.5–5.1)
Sodium: 137 mmol/L (ref 135–145)
Total Bilirubin: 0.6 mg/dL (ref 0.3–1.2)
Total Protein: 7 g/dL (ref 6.5–8.1)

## 2022-03-10 LAB — CBC WITH DIFFERENTIAL (CANCER CENTER ONLY)
Abs Immature Granulocytes: 0.09 10*3/uL — ABNORMAL HIGH (ref 0.00–0.07)
Basophils Absolute: 0 10*3/uL (ref 0.0–0.1)
Basophils Relative: 1 %
Eosinophils Absolute: 0.5 10*3/uL (ref 0.0–0.5)
Eosinophils Relative: 6 %
HCT: 28.9 % — ABNORMAL LOW (ref 36.0–46.0)
Hemoglobin: 9.5 g/dL — ABNORMAL LOW (ref 12.0–15.0)
Immature Granulocytes: 1 %
Lymphocytes Relative: 11 %
Lymphs Abs: 1 10*3/uL (ref 0.7–4.0)
MCH: 31.5 pg (ref 26.0–34.0)
MCHC: 32.9 g/dL (ref 30.0–36.0)
MCV: 95.7 fL (ref 80.0–100.0)
Monocytes Absolute: 0.6 10*3/uL (ref 0.1–1.0)
Monocytes Relative: 7 %
Neutro Abs: 6.4 10*3/uL (ref 1.7–7.7)
Neutrophils Relative %: 74 %
Platelet Count: 219 10*3/uL (ref 150–400)
RBC: 3.02 MIL/uL — ABNORMAL LOW (ref 3.87–5.11)
RDW: 15.2 % (ref 11.5–15.5)
WBC Count: 8.7 10*3/uL (ref 4.0–10.5)
nRBC: 0 % (ref 0.0–0.2)

## 2022-03-10 MED ORDER — DOXYCYCLINE HYCLATE 100 MG PO TABS: 100.0000 mg | ORAL_TABLET | Freq: Two times a day (BID) | ORAL | 0 refills | Status: DC

## 2022-03-10 MED ORDER — HYDROCODONE BIT-HOMATROP MBR 5-1.5 MG/5ML PO SOLN: 5.0000 mL | Freq: Four times a day (QID) | ORAL | 0 refills | Status: DC | PRN

## 2022-03-10 MED ORDER — HEPARIN SOD (PORK) LOCK FLUSH 100 UNIT/ML IV SOLN
500.0000 [IU] | Freq: Once | INTRAVENOUS | Status: AC
  Administered 2022-03-10: 500 [IU]

## 2022-03-10 MED ORDER — KRAZATI 200 MG PO TABS
600.0000 mg | ORAL_TABLET | Freq: Two times a day (BID) | ORAL | 3 refills | Status: DC
  Filled 2022-03-10: qty 180, 30d supply, fill #0

## 2022-03-10 MED ORDER — SODIUM CHLORIDE 0.9 % IV SOLN: Freq: Once | INTRAVENOUS | Status: AC

## 2022-03-10 MED ORDER — SODIUM CHLORIDE 0.9% FLUSH
10.0000 mL | Freq: Once | INTRAVENOUS | Status: AC
  Administered 2022-03-10: 10 mL

## 2022-03-10 MED ORDER — METHYLPREDNISOLONE 4 MG PO TBPK: ORAL_TABLET | ORAL | 0 refills | Status: DC

## 2022-03-10 NOTE — Patient Instructions (Signed)

## 2022-03-10 NOTE — Patient Instructions (Signed)

## 2022-03-11 ENCOUNTER — Other Ambulatory Visit: Payer: Self-pay | Admitting: Physician Assistant

## 2022-03-11 ENCOUNTER — Telehealth: Payer: Self-pay | Admitting: *Deleted

## 2022-03-11 DIAGNOSIS — C3492 Malignant neoplasm of unspecified part of left bronchus or lung: Secondary | ICD-10-CM

## 2022-03-11 MED ORDER — HYDROCODONE BIT-HOMATROP MBR 5-1.5 MG/5ML PO SOLN: 5.0000 mL | Freq: Four times a day (QID) | ORAL | 0 refills | Status: DC | PRN

## 2022-03-11 NOTE — Telephone Encounter (Signed)
Ms. Tissue called - said Ms. Heilingoetter,PA asked her to let you know if [facility] ([address]) didn't have the cough med. They don't, but they told her [facility] on [address] does and that RX can be sent there instead.  Ms. Heilingoetter given information

## 2022-03-12 ENCOUNTER — Other Ambulatory Visit (HOSPITAL_COMMUNITY): Payer: Self-pay

## 2022-03-12 ENCOUNTER — Other Ambulatory Visit: Payer: Self-pay

## 2022-03-13 ENCOUNTER — Other Ambulatory Visit: Payer: Self-pay | Admitting: Physician Assistant

## 2022-03-13 ENCOUNTER — Telehealth: Payer: Self-pay

## 2022-03-13 ENCOUNTER — Other Ambulatory Visit: Payer: Self-pay

## 2022-03-13 ENCOUNTER — Other Ambulatory Visit (HOSPITAL_COMMUNITY): Payer: Self-pay

## 2022-03-13 DIAGNOSIS — C3492 Malignant neoplasm of unspecified part of left bronchus or lung: Secondary | ICD-10-CM

## 2022-03-13 MED ORDER — KRAZATI 200 MG PO TABS
400.0000 mg | ORAL_TABLET | Freq: Two times a day (BID) | ORAL | 3 refills | Status: DC
  Filled 2022-03-13: qty 180, 45d supply, fill #0
  Filled 2022-03-13: qty 120, 30d supply, fill #0
  Filled 2022-07-01: qty 120, 30d supply, fill #1
  Filled 2022-08-06 – 2022-08-21 (×4): qty 120, 30d supply, fill #2
  Filled 2022-09-23 (×2): qty 120, 30d supply, fill #3
  Filled 2022-10-05 – 2022-10-19 (×2): qty 120, 30d supply, fill #4
  Filled 2022-11-12: qty 120, 30d supply, fill #5

## 2022-03-13 NOTE — Telephone Encounter (Signed)
Patient called requesting refill of Medrol Dosepak. Per Cassandra Heilingoeter PA, She does not recommend patient to take more cause it can interfere with the effectiveness of her treatments and that if her breathing gets worse that she needs to go to the emergency department. Patient verbalized understanding.

## 2022-03-15 ENCOUNTER — Encounter (HOSPITAL_COMMUNITY): Payer: Self-pay

## 2022-03-15 ENCOUNTER — Other Ambulatory Visit: Payer: Self-pay

## 2022-03-15 ENCOUNTER — Emergency Department (HOSPITAL_COMMUNITY): Payer: Managed Care, Other (non HMO)

## 2022-03-15 ENCOUNTER — Inpatient Hospital Stay (HOSPITAL_COMMUNITY)
Admission: EM | Admit: 2022-03-15 | Discharge: 2022-03-31 | DRG: 193 | Disposition: A | Discharge status: Home Health | Payer: Managed Care, Other (non HMO) | Attending: Internal Medicine | Admitting: Internal Medicine

## 2022-03-15 DIAGNOSIS — J09X1 Influenza due to identified novel influenza A virus with pneumonia: Secondary | ICD-10-CM | POA: Diagnosis present

## 2022-03-15 DIAGNOSIS — J439 Emphysema, unspecified: Secondary | ICD-10-CM | POA: Diagnosis present

## 2022-03-15 DIAGNOSIS — E872 Acidosis, unspecified: Secondary | ICD-10-CM | POA: Diagnosis present

## 2022-03-15 DIAGNOSIS — N1832 Chronic kidney disease, stage 3b: Secondary | ICD-10-CM | POA: Diagnosis present

## 2022-03-15 DIAGNOSIS — I700 Atherosclerosis of aorta: Secondary | ICD-10-CM | POA: Diagnosis present

## 2022-03-15 DIAGNOSIS — E119 Type 2 diabetes mellitus without complications: Secondary | ICD-10-CM

## 2022-03-15 DIAGNOSIS — D631 Anemia in chronic kidney disease: Secondary | ICD-10-CM | POA: Diagnosis present

## 2022-03-15 DIAGNOSIS — C7801 Secondary malignant neoplasm of right lung: Secondary | ICD-10-CM | POA: Diagnosis not present

## 2022-03-15 DIAGNOSIS — Z87891 Personal history of nicotine dependence: Secondary | ICD-10-CM

## 2022-03-15 DIAGNOSIS — Z8379 Family history of other diseases of the digestive system: Secondary | ICD-10-CM

## 2022-03-15 DIAGNOSIS — C7802 Secondary malignant neoplasm of left lung: Secondary | ICD-10-CM | POA: Diagnosis not present

## 2022-03-15 DIAGNOSIS — Z9981 Dependence on supplemental oxygen: Secondary | ICD-10-CM

## 2022-03-15 DIAGNOSIS — J101 Influenza due to other identified influenza virus with other respiratory manifestations: Secondary | ICD-10-CM | POA: Diagnosis present

## 2022-03-15 DIAGNOSIS — F39 Unspecified mood [affective] disorder: Secondary | ICD-10-CM | POA: Diagnosis present

## 2022-03-15 DIAGNOSIS — K760 Fatty (change of) liver, not elsewhere classified: Secondary | ICD-10-CM | POA: Diagnosis present

## 2022-03-15 DIAGNOSIS — Z833 Family history of diabetes mellitus: Secondary | ICD-10-CM

## 2022-03-15 DIAGNOSIS — I2510 Atherosclerotic heart disease of native coronary artery without angina pectoris: Secondary | ICD-10-CM | POA: Diagnosis present

## 2022-03-15 DIAGNOSIS — E1165 Type 2 diabetes mellitus with hyperglycemia: Secondary | ICD-10-CM | POA: Diagnosis present

## 2022-03-15 DIAGNOSIS — Z885 Allergy status to narcotic agent status: Secondary | ICD-10-CM

## 2022-03-15 DIAGNOSIS — Z813 Family history of other psychoactive substance abuse and dependence: Secondary | ICD-10-CM

## 2022-03-15 DIAGNOSIS — F32A Depression, unspecified: Secondary | ICD-10-CM | POA: Diagnosis present

## 2022-03-15 DIAGNOSIS — J441 Chronic obstructive pulmonary disease with (acute) exacerbation: Secondary | ICD-10-CM | POA: Diagnosis present

## 2022-03-15 DIAGNOSIS — J9621 Acute and chronic respiratory failure with hypoxia: Secondary | ICD-10-CM | POA: Diagnosis present

## 2022-03-15 DIAGNOSIS — I129 Hypertensive chronic kidney disease with stage 1 through stage 4 chronic kidney disease, or unspecified chronic kidney disease: Secondary | ICD-10-CM | POA: Diagnosis present

## 2022-03-15 DIAGNOSIS — E860 Dehydration: Secondary | ICD-10-CM | POA: Diagnosis present

## 2022-03-15 DIAGNOSIS — K649 Unspecified hemorrhoids: Secondary | ICD-10-CM | POA: Diagnosis present

## 2022-03-15 DIAGNOSIS — Z8279 Family history of other congenital malformations, deformations and chromosomal abnormalities: Secondary | ICD-10-CM

## 2022-03-15 DIAGNOSIS — Z8719 Personal history of other diseases of the digestive system: Secondary | ICD-10-CM

## 2022-03-15 DIAGNOSIS — N179 Acute kidney failure, unspecified: Secondary | ICD-10-CM | POA: Diagnosis present

## 2022-03-15 DIAGNOSIS — Y92239 Unspecified place in hospital as the place of occurrence of the external cause: Secondary | ICD-10-CM | POA: Diagnosis present

## 2022-03-15 DIAGNOSIS — K219 Gastro-esophageal reflux disease without esophagitis: Secondary | ICD-10-CM | POA: Diagnosis present

## 2022-03-15 DIAGNOSIS — D638 Anemia in other chronic diseases classified elsewhere: Secondary | ICD-10-CM | POA: Diagnosis present

## 2022-03-15 DIAGNOSIS — E1140 Type 2 diabetes mellitus with diabetic neuropathy, unspecified: Secondary | ICD-10-CM | POA: Diagnosis present

## 2022-03-15 DIAGNOSIS — Z9049 Acquired absence of other specified parts of digestive tract: Secondary | ICD-10-CM

## 2022-03-15 DIAGNOSIS — E1122 Type 2 diabetes mellitus with diabetic chronic kidney disease: Secondary | ICD-10-CM | POA: Diagnosis present

## 2022-03-15 DIAGNOSIS — D72829 Elevated white blood cell count, unspecified: Secondary | ICD-10-CM | POA: Diagnosis not present

## 2022-03-15 DIAGNOSIS — I10 Essential (primary) hypertension: Secondary | ICD-10-CM | POA: Diagnosis not present

## 2022-03-15 DIAGNOSIS — Z888 Allergy status to other drugs, medicaments and biological substances status: Secondary | ICD-10-CM

## 2022-03-15 DIAGNOSIS — Z90710 Acquired absence of both cervix and uterus: Secondary | ICD-10-CM

## 2022-03-15 DIAGNOSIS — Z881 Allergy status to other antibiotic agents status: Secondary | ICD-10-CM

## 2022-03-15 DIAGNOSIS — E871 Hypo-osmolality and hyponatremia: Secondary | ICD-10-CM | POA: Diagnosis present

## 2022-03-15 DIAGNOSIS — Z1152 Encounter for screening for COVID-19: Secondary | ICD-10-CM

## 2022-03-15 DIAGNOSIS — R195 Other fecal abnormalities: Secondary | ICD-10-CM | POA: Diagnosis present

## 2022-03-15 DIAGNOSIS — Z6827 Body mass index (BMI) 27.0-27.9, adult: Secondary | ICD-10-CM

## 2022-03-15 DIAGNOSIS — J111 Influenza due to unidentified influenza virus with other respiratory manifestations: Principal | ICD-10-CM

## 2022-03-15 DIAGNOSIS — C3490 Malignant neoplasm of unspecified part of unspecified bronchus or lung: Secondary | ICD-10-CM | POA: Diagnosis present

## 2022-03-15 DIAGNOSIS — Z955 Presence of coronary angioplasty implant and graft: Secondary | ICD-10-CM

## 2022-03-15 DIAGNOSIS — K900 Celiac disease: Secondary | ICD-10-CM | POA: Diagnosis present

## 2022-03-15 DIAGNOSIS — E875 Hyperkalemia: Secondary | ICD-10-CM | POA: Diagnosis present

## 2022-03-15 DIAGNOSIS — R0602 Shortness of breath: Secondary | ICD-10-CM | POA: Diagnosis not present

## 2022-03-15 DIAGNOSIS — Z825 Family history of asthma and other chronic lower respiratory diseases: Secondary | ICD-10-CM

## 2022-03-15 DIAGNOSIS — R627 Adult failure to thrive: Secondary | ICD-10-CM | POA: Diagnosis present

## 2022-03-15 DIAGNOSIS — T380X5A Adverse effect of glucocorticoids and synthetic analogues, initial encounter: Secondary | ICD-10-CM | POA: Diagnosis present

## 2022-03-15 DIAGNOSIS — M797 Fibromyalgia: Secondary | ICD-10-CM | POA: Diagnosis present

## 2022-03-15 DIAGNOSIS — R748 Abnormal levels of other serum enzymes: Secondary | ICD-10-CM | POA: Diagnosis not present

## 2022-03-15 DIAGNOSIS — J449 Chronic obstructive pulmonary disease, unspecified: Secondary | ICD-10-CM

## 2022-03-15 DIAGNOSIS — E039 Hypothyroidism, unspecified: Secondary | ICD-10-CM | POA: Diagnosis present

## 2022-03-15 DIAGNOSIS — B029 Zoster without complications: Secondary | ICD-10-CM | POA: Diagnosis not present

## 2022-03-15 DIAGNOSIS — E785 Hyperlipidemia, unspecified: Secondary | ICD-10-CM | POA: Diagnosis present

## 2022-03-15 DIAGNOSIS — Z79899 Other long term (current) drug therapy: Secondary | ICD-10-CM

## 2022-03-15 DIAGNOSIS — Z91040 Latex allergy status: Secondary | ICD-10-CM

## 2022-03-15 DIAGNOSIS — Z7982 Long term (current) use of aspirin: Secondary | ICD-10-CM

## 2022-03-15 DIAGNOSIS — Z800 Family history of malignant neoplasm of digestive organs: Secondary | ICD-10-CM

## 2022-03-15 LAB — CBC WITH DIFFERENTIAL/PLATELET
Abs Immature Granulocytes: 0.26 10*3/uL — ABNORMAL HIGH (ref 0.00–0.07)
Basophils Absolute: 0 10*3/uL (ref 0.0–0.1)
Basophils Relative: 0 %
Eosinophils Absolute: 0 10*3/uL (ref 0.0–0.5)
Eosinophils Relative: 0 %
HCT: 31.9 % — ABNORMAL LOW (ref 36.0–46.0)
Hemoglobin: 10 g/dL — ABNORMAL LOW (ref 12.0–15.0)
Immature Granulocytes: 1 %
Lymphocytes Relative: 6 %
Lymphs Abs: 1.2 10*3/uL (ref 0.7–4.0)
MCH: 30.3 pg (ref 26.0–34.0)
MCHC: 31.3 g/dL (ref 30.0–36.0)
MCV: 96.7 fL (ref 80.0–100.0)
Monocytes Absolute: 0.9 10*3/uL (ref 0.1–1.0)
Monocytes Relative: 4 %
Neutro Abs: 17.5 10*3/uL — ABNORMAL HIGH (ref 1.7–7.7)
Neutrophils Relative %: 89 %
Platelets: 320 10*3/uL (ref 150–400)
RBC: 3.3 MIL/uL — ABNORMAL LOW (ref 3.87–5.11)
RDW: 15.1 % (ref 11.5–15.5)
WBC: 19.8 10*3/uL — ABNORMAL HIGH (ref 4.0–10.5)
nRBC: 0.1 % (ref 0.0–0.2)

## 2022-03-15 LAB — LACTIC ACID, PLASMA: Lactic Acid, Venous: 0.8 mmol/L (ref 0.5–1.9)

## 2022-03-15 LAB — BASIC METABOLIC PANEL
Anion gap: 8 (ref 5–15)
BUN: 56 mg/dL — ABNORMAL HIGH (ref 8–23)
CO2: 21 mmol/L — ABNORMAL LOW (ref 22–32)
Calcium: 8.5 mg/dL — ABNORMAL LOW (ref 8.9–10.3)
Chloride: 104 mmol/L (ref 98–111)
Creatinine, Ser: 2.31 mg/dL — ABNORMAL HIGH (ref 0.44–1.00)
GFR, Estimated: 23 mL/min — ABNORMAL LOW (ref >60)
Glucose, Bld: 200 mg/dL — ABNORMAL HIGH (ref 70–99)
Potassium: 4.2 mmol/L (ref 3.5–5.1)
Sodium: 133 mmol/L — ABNORMAL LOW (ref 135–145)

## 2022-03-15 LAB — BLOOD GAS, VENOUS
Acid-base deficit: 4.4 mmol/L — ABNORMAL HIGH (ref 0.0–2.0)
Bicarbonate: 22.6 mmol/L (ref 20.0–28.0)
O2 Saturation: 57.7 %
Patient temperature: 37.1
pCO2, Ven: 48 mmHg (ref 44–60)
pH, Ven: 7.28 (ref 7.25–7.43)
pO2, Ven: 34 mmHg (ref 32–45)

## 2022-03-15 LAB — RESP PANEL BY RT-PCR (RSV, FLU A&B, COVID)  RVPGX2
Influenza A by PCR: POSITIVE — AB
Influenza B by PCR: NEGATIVE
Resp Syncytial Virus by PCR: NEGATIVE
SARS Coronavirus 2 by RT PCR: NEGATIVE

## 2022-03-15 MED ORDER — IPRATROPIUM-ALBUTEROL 0.5-2.5 (3) MG/3ML IN SOLN
3.0000 mL | Freq: Four times a day (QID) | RESPIRATORY_TRACT | Status: DC | PRN
  Administered 2022-03-15 – 2022-03-16 (×2): 3 mL via RESPIRATORY_TRACT
  Filled 2022-03-15 (×2): qty 3

## 2022-03-15 MED ORDER — ACETAMINOPHEN 650 MG RE SUPP: 650.0000 mg | Freq: Four times a day (QID) | RECTAL | Status: DC | PRN

## 2022-03-15 MED ORDER — ONDANSETRON HCL 4 MG/2ML IJ SOLN
4.0000 mg | Freq: Four times a day (QID) | INTRAMUSCULAR | Status: DC | PRN
  Administered 2022-03-16 – 2022-03-21 (×7): 4 mg via INTRAVENOUS
  Filled 2022-03-15 (×3): qty 2

## 2022-03-15 MED ORDER — BUDESON-GLYCOPYRROL-FORMOTEROL 160-9-4.8 MCG/ACT IN AERO: 2.0000 | INHALATION_SPRAY | Freq: Two times a day (BID) | RESPIRATORY_TRACT | Status: DC

## 2022-03-15 MED ORDER — BUPROPION HCL ER (SR) 150 MG PO TB12
150.0000 mg | ORAL_TABLET | Freq: Every day | ORAL | Status: DC
  Administered 2022-03-16 – 2022-03-18 (×3): 150 mg via ORAL
  Filled 2022-03-15 (×4): qty 1

## 2022-03-15 MED ORDER — ONDANSETRON HCL 4 MG/2ML IJ SOLN
4.0000 mg | Freq: Four times a day (QID) | INTRAMUSCULAR | Status: DC | PRN
  Administered 2022-03-15 – 2022-03-24 (×6): 4 mg via INTRAVENOUS
  Filled 2022-03-15 (×10): qty 2

## 2022-03-15 MED ORDER — ONDANSETRON HCL 4 MG PO TABS
4.0000 mg | ORAL_TABLET | Freq: Four times a day (QID) | ORAL | Status: DC | PRN
  Administered 2022-03-21 – 2022-03-25 (×4): 4 mg via ORAL
  Filled 2022-03-15 (×4): qty 1

## 2022-03-15 MED ORDER — SODIUM CHLORIDE 0.9% FLUSH
3.0000 mL | Freq: Two times a day (BID) | INTRAVENOUS | Status: DC
  Administered 2022-03-15 – 2022-03-28 (×14): 3 mL via INTRAVENOUS

## 2022-03-15 MED ORDER — ASPIRIN 81 MG PO CHEW
81.0000 mg | CHEWABLE_TABLET | Freq: Every day | ORAL | Status: DC
  Administered 2022-03-16 – 2022-03-31 (×16): 81 mg via ORAL
  Filled 2022-03-15 (×16): qty 1

## 2022-03-15 MED ORDER — MORPHINE SULFATE (PF) 2 MG/ML IV SOLN
2.0000 mg | Freq: Once | INTRAVENOUS | Status: AC
  Administered 2022-03-15: 2 mg via INTRAVENOUS
  Filled 2022-03-15: qty 1

## 2022-03-15 MED ORDER — HYDRALAZINE HCL 20 MG/ML IJ SOLN: 10.0000 mg | Freq: Four times a day (QID) | INTRAMUSCULAR | Status: DC | PRN

## 2022-03-15 MED ORDER — IPRATROPIUM-ALBUTEROL 0.5-2.5 (3) MG/3ML IN SOLN
3.0000 mL | Freq: Once | RESPIRATORY_TRACT | Status: AC
  Administered 2022-03-15: 3 mL via RESPIRATORY_TRACT
  Filled 2022-03-15: qty 3

## 2022-03-15 MED ORDER — SODIUM CHLORIDE 0.9 % IV BOLUS
1000.0000 mL | Freq: Once | INTRAVENOUS | Status: AC
  Administered 2022-03-15: 1000 mL via INTRAVENOUS

## 2022-03-15 MED ORDER — HYDROCODONE BIT-HOMATROP MBR 5-1.5 MG/5ML PO SOLN
5.0000 mL | Freq: Four times a day (QID) | ORAL | Status: DC | PRN
  Administered 2022-03-16 – 2022-03-17 (×3): 5 mL via ORAL
  Filled 2022-03-15 (×5): qty 5

## 2022-03-15 MED ORDER — SODIUM CHLORIDE 0.9 % IV SOLN: INTRAVENOUS | Status: DC

## 2022-03-15 MED ORDER — METHYLPREDNISOLONE SODIUM SUCC 40 MG IJ SOLR
40.0000 mg | Freq: Two times a day (BID) | INTRAMUSCULAR | Status: DC
  Administered 2022-03-15 – 2022-03-21 (×12): 40 mg via INTRAVENOUS
  Filled 2022-03-15 (×13): qty 1

## 2022-03-15 MED ORDER — SENNOSIDES-DOCUSATE SODIUM 8.6-50 MG PO TABS: 1.0000 | ORAL_TABLET | Freq: Every evening | ORAL | Status: DC | PRN

## 2022-03-15 MED ORDER — ACETAMINOPHEN 325 MG PO TABS
650.0000 mg | ORAL_TABLET | Freq: Four times a day (QID) | ORAL | Status: DC | PRN
  Administered 2022-03-15 – 2022-03-30 (×15): 650 mg via ORAL
  Filled 2022-03-15 (×15): qty 2

## 2022-03-15 MED ORDER — ADAGRASIB 200 MG PO TABS: 400.0000 mg | ORAL_TABLET | Freq: Two times a day (BID) | ORAL | Status: DC

## 2022-03-15 MED ORDER — LORAZEPAM 1 MG PO TABS
2.0000 mg | ORAL_TABLET | Freq: Every evening | ORAL | Status: DC
  Administered 2022-03-15 – 2022-03-30 (×16): 2 mg via ORAL
  Filled 2022-03-15 (×16): qty 2

## 2022-03-15 MED ORDER — BISACODYL 5 MG PO TBEC
5.0000 mg | DELAYED_RELEASE_TABLET | Freq: Every day | ORAL | Status: DC | PRN
  Filled 2022-03-15: qty 1

## 2022-03-15 MED ORDER — NITROGLYCERIN 0.4 MG SL SUBL
0.4000 mg | SUBLINGUAL_TABLET | SUBLINGUAL | Status: DC | PRN
  Administered 2022-03-17: 0.4 mg via SUBLINGUAL
  Filled 2022-03-15: qty 1

## 2022-03-15 MED ORDER — OSELTAMIVIR PHOSPHATE 30 MG PO CAPS
30.0000 mg | ORAL_CAPSULE | Freq: Every day | ORAL | Status: AC
  Administered 2022-03-16 – 2022-03-19 (×4): 30 mg via ORAL
  Filled 2022-03-15 (×4): qty 1

## 2022-03-15 MED ORDER — ENOXAPARIN SODIUM 30 MG/0.3ML IJ SOSY
30.0000 mg | PREFILLED_SYRINGE | INTRAMUSCULAR | Status: DC
  Administered 2022-03-16 – 2022-03-19 (×4): 30 mg via SUBCUTANEOUS
  Filled 2022-03-15 (×4): qty 0.3

## 2022-03-15 MED ORDER — METOPROLOL TARTRATE 12.5 MG HALF TABLET
12.5000 mg | ORAL_TABLET | Freq: Two times a day (BID) | ORAL | Status: DC
  Administered 2022-03-15 – 2022-03-30 (×27): 12.5 mg via ORAL
  Filled 2022-03-15 (×32): qty 1

## 2022-03-15 MED ORDER — OSELTAMIVIR PHOSPHATE 75 MG PO CAPS
75.0000 mg | ORAL_CAPSULE | Freq: Two times a day (BID) | ORAL | Status: DC
  Administered 2022-03-15: 75 mg via ORAL
  Filled 2022-03-15: qty 1

## 2022-03-15 NOTE — H&P (Incomplete)
History and Physical   [facility] [place] [facility] Admission History and Physical [employer], D.O.    Patient Name: [name] MR#: [id number] Date of Birth: February 03, 1956 Date of Admission: 03/15/2022  Referring MD/NP/PA: PA Luther Hearing Primary Care Physician: [name], NP  Chief Complaint:  Chief Complaint  Patient presents with   Shortness of Breath    HPI: [name] is a 67 y.o. 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  Patient was in a usual state of health until yesterday when she reports worsening shortness of breath with congestion.  Patient usually uses 4 L of oxygen by nasal cannula but has been using a portable tank since power went out at her home..  Patient denies fevers/chills, weakness, dizziness, chest pain, shortness of breath, N/V/C/D, abdominal pain, dysuria/frequency, changes in mental status.    Otherwise there has been no change in status. Patient has been taking medication as prescribed and there has been no recent change in medication or diet.  No recent antibiotics.  There has been no recent illness, hospitalizations, travel or sick contacts.    EMS/ED Course: Patient received morphine, normal saline, DuoNeb and Zofran. Medical admission has been requested for further management of influenza A, AKI likely secondary to dehydration.  Review of Systems:  CONSTITUTIONAL: No fever/chills, fatigue, weakness, weight gain/loss, headache. EYES: No blurry or double vision. ENT: No tinnitus, postnasal drip, redness or soreness of the oropharynx. RESPIRATORY: Positive cough, dyspnea, wheeze.  No hemoptysis.  CARDIOVASCULAR: No chest pain, palpitations, syncope, orthopnea. No lower extremity edema.   GASTROINTESTINAL: No nausea, vomiting, abdominal pain, diarrhea, constipation.  No hematemesis, melena or hematochezia. GENITOURINARY: No dysuria, frequency, hematuria. ENDOCRINE: No polyuria or nocturia. No heat or cold intolerance. HEMATOLOGY: No anemia, bruising, bleeding. INTEGUMENTARY: No rashes, ulcers, lesions. MUSCULOSKELETAL: No arthritis, gout. NEUROLOGIC: No numbness, tingling, ataxia, seizure-type activity, weakness. PSYCHIATRIC: No anxiety, depression, insomnia.   Past Medical History:  Diagnosis Date   Anemia    "long time ago" (12/06/2012)   Anxiety    Arthritis    "right knee real bad; in my hands bad" (12/06/2012)   Asthma    Celiac disease    Colon polyps    COPD (chronic obstructive pulmonary disease) (HCC)    Coronary artery disease    a. s/p Xience DES to [facility] 04/2008;  b. LHC 05/2008: Proximal RCA 25%, mid RCA stent patent.  c. Anormal nuc 2014 -> s/p LHC with severe mRCA stenosis s/p DES. d. Cath 04/2013 s/p DES to LAD.  e. LHC 08/2015 was stable.   Depression    "years ago" (12/06/2012)   Fibromyalgia    Gallstones    GERD (gastroesophageal reflux disease)    H/O hiatal hernia    Hepatitis    "not A, B, or C" (12/06/2012)   Hyperlipidemia    intol to statins and other chol agents due to elevated LFTs   Hypertension    Hypothyroidism    IBS (irritable bowel syndrome)    Interstitial cystitis    Lung cancer (HCC) dx'd 01/2018   Lung nodules    Bilateral   Migraines    "when I was younger" (12/06/2012)   Pneumonia    PONV (postoperative nausea and vomiting)    "Very bad"   Premature atrial contractions    PVC's (premature  ventricular contractions)    Sinus headache    SVT (supraventricular tachycardia)     Past Surgical History:  Procedure Laterality Date   CARDIAC CATHETERIZATION  04/2008; 05/2008   CARDIAC CATHETERIZATION N/A 08/07/2015   Procedure: Left Heart Cath and Coronary Angiography;  Surgeon: Tonny Bollman, MD;  Location[facility] INVASIVE CV LAB;   Service: Cardiovascular;  Laterality: N/A;   CHOLECYSTECTOMY     CORONARY ANGIOPLASTY WITH STENT PLACEMENT  04/2008 12/06/2012   "1 + 1" (12/06/2012)   CORONARY STENT PLACEMENT  05/08/2013   DES TO LAD      DR MCALHANY   IR IMAGING GUIDED PORT INSERTION  04/29/2018   LEFT HEART CATHETERIZATION WITH CORONARY ANGIOGRAM N/A 12/06/2012   Procedure: LEFT HEART CATHETERIZATION WITH CORONARY ANGIOGRAM;  Surgeon: Kathleene Hazel, MD;  Location: [facility] CATH LAB;  Service: Cardiovascular;  Laterality: N/A;   LEFT HEART CATHETERIZATION WITH CORONARY ANGIOGRAM N/A 05/08/2013   Procedure: LEFT HEART CATHETERIZATION WITH CORONARY ANGIOGRAM;  Surgeon: Kathleene Hazel, MD;  Location: [facility] CATH LAB;  Service: Cardiovascular;  Laterality: N/A;   LEFT HEART CATHETERIZATION WITH CORONARY ANGIOGRAM N/A 01/04/2014   Procedure: LEFT HEART CATHETERIZATION WITH CORONARY ANGIOGRAM;  Surgeon: Kathleene Hazel, MD;  Location: [facility] CATH LAB;  Service: Cardiovascular;  Laterality: N/A;   PERCUTANEOUS CORONARY STENT INTERVENTION (PCI-S)  12/06/2012   Procedure: PERCUTANEOUS CORONARY STENT INTERVENTION (PCI-S);  Surgeon: Kathleene Hazel, MD;  Location: [facility] CATH LAB;  Service: Cardiovascular;;   PERCUTANEOUS CORONARY STENT INTERVENTION (PCI-S)  05/08/2013   Procedure: PERCUTANEOUS CORONARY STENT INTERVENTION (PCI-S);  Surgeon: Kathleene Hazel, MD;  Location: [facility] CATH LAB;  Service: Cardiovascular;;  mid LAD    TUBAL LIGATION     VAGINAL HYSTERECTOMY     VIDEO BRONCHOSCOPY WITH ENDOBRONCHIAL NAVIGATION N/A 02/17/2018   Procedure: VIDEO BRONCHOSCOPY WITH ENDOBRONCHIAL NAVIGATION;  Surgeon: Loreli Slot, MD;  Location: [facility] OR;  Service: Thoracic;  Laterality: N/A;     reports that she quit smoking about 15 years ago. Her smoking use included cigarettes. She has a 15.00 pack-year smoking history. She has never used smokeless tobacco. She reports that she does not drink alcohol and does not use  drugs.  Allergies  Allergen Reactions   Ciprofloxacin Hives   Levofloxacin     UNSPECIFIED REACTION, BUT LIKELY HIVES > HIVES WITH CIPRO   Statins Other (See Comments)    Liver enzymes Liver enzymes   Tricor [Fenofibrate] Hives and Itching   Compazine [Prochlorperazine]     Hx of tardive dyskinesia with compazine   Latex Other (See Comments)    Patient states "Messes with my skin."   Codeine Nausea And Vomiting   Metoprolol Other (See Comments)    Does not tolerate beta blockers 10/02/2013-pt states she can tolerate 25 mg and lower     Family History  Problem Relation Age of Onset   Colon cancer Father 64   Lung disease Father    Emphysema Mother    Drug abuse Brother    Hepatitis C Brother    Drug abuse Brother    Celiac disease Son    Celiac disease Grandson    Down syndrome Grandson    Diabetes Granddaughter    CAD Neg Hx     Prior to Admission medications   Medication Sig Start Date End Date Taking? Authorizing Provider  adagrasib (KRAZATI) 200 MG tablet Take 2 tablets (400 mg total) by mouth 2 (two) times daily. 03/13/22   Heilingoetter, Cassandra L, PA-C  albuterol (PROVENTIL) (5 MG/ML) 0.5% nebulizer solution Take 2.5 mg by nebulization every 6 (six) hours as needed for wheezing.    [provider]  albuterol (VENTOLIN HFA) 108 (90 Base) MCG/ACT inhaler Inhale 1 puff into the lungs every 6 (six) hours as needed (wheezing/shortness of breath.).     [provider]  ALPRAZolam Prudy Feeler) 0.25 MG tablet Take 1 tablet (0.25 mg total) by mouth at bedtime as needed for anxiety. Take 1 tablets 30 minutes prior to MRI. Have a driver. 03/03/22   Si Gaul, MD  aspirin 81 MG chewable tablet Chew 1 tablet (81 mg total) by mouth daily. 12/07/12   Barrett, Joline Salt, PA-C  Budeson-Glycopyrrol-Formoterol (BREZTRI AEROSPHERE) 160-9-4.8 MCG/ACT AERO Inhale 2 puffs into the lungs in the morning and at bedtime. 10/14/21   Icard, Rachel Bo, DO  buPROPion (WELLBUTRIN  SR) 150 MG 12 hr tablet Take 150 mg by mouth daily.     [provider]  doxycycline (VIBRA-TABS) 100 MG tablet Take 1 tablet (100 mg total) by mouth 2 (two) times daily. 03/10/22   Heilingoetter, Cassandra L, PA-C  HYDROcodone bit-homatropine (HYCODAN) 5-1.5 MG/5ML syrup Take 5 mLs by mouth every 6 (six) hours as needed for cough. 03/11/22   Heilingoetter, Cassandra L, PA-C  ipratropium-albuterol (DUONEB) 0.5-2.5 (3) MG/3ML SOLN Inhale 3 mLs into the lungs every 6 (six) hours as needed (Asthma). 01/27/21   Parrett, Virgel Bouquet, NP  LORazepam (ATIVAN) 2 MG tablet Take 2 mg by mouth every evening.    [provider]  methylPREDNISolone (MEDROL DOSEPAK) 4 MG TBPK tablet Use as instructed 03/10/22   Heilingoetter, Cassandra L, PA-C  metoprolol tartrate (LOPRESSOR) 25 MG tablet TAKE 1/2 TABLET(12.5 MG) BY MOUTH TWICE DAILY 06/25/20   Kathleene Hazel, MD  nitroGLYCERIN (NITROSTAT) 0.4 MG SL tablet Place 1 tablet (0.4 mg total) under the tongue every 5 (five) minutes as needed for chest pain. 12/07/12   Barrett, Joline Salt, PA-C  ondansetron (ZOFRAN-ODT) 4 MG disintegrating tablet Take 1 tablet (4 mg total) by mouth every 8 (eight) hours as needed for nausea or vomiting. 02/16/22   Heilingoetter, Cassandra L, PA-C  Vitamin D, Ergocalciferol, (DRISDOL) 1.25 MG (50000 UT) CAPS capsule Take 50,000 Units by mouth every 7 (seven) days. Wednesdays 02/12/19   [provider]    Physical Exam: Vitals:   03/15/22 1830 03/15/22 1831 03/15/22 1915 03/15/22 1930  BP: (!) 118/105  (!) 143/127 (!) 143/85  Pulse: 84  82 79  Resp: (!) 26   (!) 22  Temp:      TempSrc:      SpO2: 91%  96% 99%  Weight:  79.4 kg    Height:  5\' 7"  (1.702 m)      GENERAL: 67 y.o.-year-old white female patient, well-developed, well-nourished lying in the bed in no acute distress.  Pleasant and cooperative.   HEENT: Head atraumatic, normocephalic. Pupils equal. Mucus membranes moist. NECK: Supple. No JVD. CHEST:  Bilateral rales diffusely. No use of accessory muscles of respiration.  No reproducible chest wall tenderness.  CARDIOVASCULAR: S1, S2 normal. No murmurs, rubs, or gallops. Cap refill <2 seconds. Pulses intact distally.  ABDOMEN: Soft, nondistended, nontender. No rebound, guarding, rigidity. Normoactive bowel sounds present in all four quadrants.  EXTREMITIES: No pedal edema, cyanosis, or clubbing. No calf tenderness or Homan's sign.  NEUROLOGIC: The patient is alert and oriented x 3. Cranial nerves II through XII are grossly intact with no focal sensorimotor deficit. PSYCHIATRIC:  Normal affect, mood, thought  content. SKIN: Warm, dry, and intact without obvious rash, lesion, or ulcer.    Labs on Admission:  CBC: Recent Labs  Lab 03/10/22 1458 03/15/22 1924  WBC 8.7 19.8*  NEUTROABS 6.4 17.5*  HGB 9.5* 10.0*  HCT 28.9* 31.9*  MCV 95.7 96.7  PLT 219 409   Basic Metabolic Panel: Recent Labs  Lab 03/10/22 1458 03/15/22 1924  NA 137 133*  K 4.6 4.2  CL 106 104  CO2 24 21*  GLUCOSE 292* 200*  BUN 25* 56*  CREATININE 2.03* 2.31*  CALCIUM 8.7* 8.5*   GFR: Estimated Creatinine Clearance: 25.6 mL/min (A) (by C-G formula based on SCr of 2.31 mg/dL (H)). Liver Function Tests: Recent Labs  Lab 03/10/22 1458  AST 13*  ALT 39  ALKPHOS 125  BILITOT 0.6  PROT 7.0  ALBUMIN 3.8   No results for input(s): "LIPASE", "AMYLASE" in the last 168 hours. No results for input(s): "AMMONIA" in the last 168 hours. Coagulation Profile: No results for input(s): "INR", "PROTIME" in the last 168 hours. Cardiac Enzymes: No results for input(s): "CKTOTAL", "CKMB", "CKMBINDEX", "TROPONINI" in the last 168 hours. BNP (last 3 results) No results for input(s): "PROBNP" in the last 8760 hours. HbA1C: No results for input(s): "HGBA1C" in the last 72 hours. CBG: No results for input(s): "GLUCAP" in the last 168 hours. Lipid Profile: No results for input(s): "CHOL", "HDL", "LDLCALC", "TRIG",  "CHOLHDL", "LDLDIRECT" in the last 72 hours. Thyroid Function Tests: No results for input(s): "TSH", "T4TOTAL", "FREET4", "T3FREE", "THYROIDAB" in the last 72 hours. Anemia Panel: No results for input(s): "VITAMINB12", "FOLATE", "FERRITIN", "TIBC", "IRON", "RETICCTPCT" in the last 72 hours. Urine analysis:    Component Value Date/Time   COLORURINE AMBER (A) 10/11/2019 2055   APPEARANCEUR HAZY (A) 10/11/2019 2055   LABSPEC 1.027 10/11/2019 2055   PHURINE 5.0 10/11/2019 2055   GLUCOSEU NEGATIVE 10/11/2019 2055   HGBUR SMALL (A) 10/11/2019 2055   BILIRUBINUR NEGATIVE 10/11/2019 2055   KETONESUR NEGATIVE 10/11/2019 2055   PROTEINUR 100 (A) 10/11/2019 2055   UROBILINOGEN 0.2 02/06/2009 2322   NITRITE NEGATIVE 10/11/2019 2055   LEUKOCYTESUR NEGATIVE 10/11/2019 2055   Sepsis Labs: @LABRCNTIP (procalcitonin:4,lacticidven:4) ) Recent Results (from the past 240 hour(s))  Resp panel by RT-PCR (RSV, Flu A&B, Covid) Anterior Nasal Swab     Status: Abnormal   Collection Time: 03/15/22  7:21 PM   Specimen: Anterior Nasal Swab  Result Value Ref Range Status   SARS Coronavirus 2 by RT PCR NEGATIVE NEGATIVE Final    Comment: (NOTE) SARS-CoV-2 target nucleic acids are NOT DETECTED.  The SARS-CoV-2 RNA is generally detectable in upper respiratory specimens during the acute phase of infection. The lowest concentration of SARS-CoV-2 viral copies this assay can detect is 138 copies/mL. A negative result does not preclude SARS-Cov-2 infection and should not be used as the sole basis for treatment or other patient management decisions. A negative result may occur with  improper specimen collection/handling, submission of specimen other than nasopharyngeal swab, presence of viral mutation(s) within the areas targeted by this assay, and inadequate number of viral copies(<138 copies/mL). A negative result must be combined with clinical observations, patient history, and epidemiological information.  The expected result is Negative.  Fact Sheet for Patients:  EntrepreneurPulse.com.au  Fact Sheet for Healthcare Providers:  IncredibleEmployment.be  This test is no t yet approved or cleared by the [place] FDA and  has been authorized for detection and/or diagnosis of SARS-CoV-2 by FDA under an Emergency Use Authorization (EUA). This  EUA will remain  in effect (meaning this test can be used) for the duration of the COVID-19 declaration under Section 564(b)(1) of the Act, 21 U.S.C.section 360bbb-3(b)(1), unless the authorization is terminated  or revoked sooner.       Influenza A by PCR POSITIVE (A) NEGATIVE Final   Influenza B by PCR NEGATIVE NEGATIVE Final    Comment: (NOTE) The Xpert Xpress SARS-CoV-2/FLU/RSV plus assay is intended as an aid in the diagnosis of influenza from Nasopharyngeal swab specimens and should not be used as a sole basis for treatment. Nasal washings and aspirates are unacceptable for Xpert Xpress SARS-CoV-2/FLU/RSV testing.  Fact Sheet for Patients: BloggerCourse.com  Fact Sheet for Healthcare Providers: SeriousBroker.it  This test is not yet approved or cleared by the [place] FDA and has been authorized for detection and/or diagnosis of SARS-CoV-2 by FDA under an Emergency Use Authorization (EUA). This EUA will remain in effect (meaning this test can be used) for the duration of the COVID-19 declaration under Section 564(b)(1) of the Act, 21 U.S.C. section 360bbb-3(b)(1), unless the authorization is terminated or revoked.     Resp Syncytial Virus by PCR NEGATIVE NEGATIVE Final    Comment: (NOTE) Fact Sheet for Patients: BloggerCourse.com  Fact Sheet for Healthcare Providers: SeriousBroker.it  This test is not yet approved or cleared by the [place] FDA and has been authorized for detection  and/or diagnosis of SARS-CoV-2 by FDA under an Emergency Use Authorization (EUA). This EUA will remain in effect (meaning this test can be used) for the duration of the COVID-19 declaration under Section 564(b)(1) of the Act, 21 U.S.C. section 360bbb-3(b)(1), unless the authorization is terminated or revoked.  Performed at [facility], [address] [place], [place] [address]      Radiological Exams on Admission: DG Chest 2 View  Result Date: 03/15/2022 CLINICAL DATA:  shob EXAM: CHEST - 2 VIEW COMPARISON:  October 04, 2018., February 12, 2022 FINDINGS: The cardiomediastinal silhouette is unchanged in contour.RIGHT chest port with tip terminating over the superior cavoatrial junction. No pleural effusion. No pneumothorax. No acute pleuroparenchymal abnormality. Known bilateral pulmonary nodularity is better assessed on recent PET-CT. Visualized abdomen is unremarkable. Similar minimal wedging of a upper thoracic vertebral body. IMPRESSION: 1. No acute cardiopulmonary abnormality. 2. Known bilateral pulmonary nodularity is better assessed on recent PET-CT. Electronically Signed   By: Meda Klinefelter M.D.   On: 03/15/2022 19:14    EKG: Normal sinus rhythm at *** bpm with normal axis and nonspecific ST-T wave changes.   Assessment/Plan  This is a 67 y.o. 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  Influenza A - Admit inpatient - Will initiate Tamiflu - O2, nebs, antitussives as needed  #. Acute kidney injury  - IV fluids and repeat BMP in AM.  - Avoid nephrotoxic medications  #.  History of stage IV adenocarcinoma of the lung - Continue at adagrasib  #. History of COPD - Continue DuoNebs, Breo as tree, Solu-Medrol  #. History of CAD - Continue aspirin, nitroglycerin, metoprolol  #.  History of anxiety, depression, fibromyalgia - Continue lorazepam, bupropion  Admission status: Inpatient IV Fluids: Normal saline Diet/Nutrition: Heart healthy Consults called: None DVT Px: Lovenox, SCDs and early ambulation. Code Status: Full Code  Disposition Plan: To home in 1-2 days  All the records are reviewed and case discussed with ED  provider. Management plans discussed with the patient and/or family who express understanding and agree with plan of care.  Neiko Trivedi D.O. on 03/15/2022 at 9:17 PM CC: Primary care physician; [name], NP   03/15/2022, 9:17 PM

## 2022-03-15 NOTE — ED Triage Notes (Signed)
Pt BIBA from home with c/o congestion, increased SOB since yesterday. Normally on 4L of oxygen, power went out at home so pt has been using portable tank but has not been receiving as much oxygen. Rales heard throughout.  Hx Lung cancer stage 4  O2 93 RA

## 2022-03-15 NOTE — ED Provider Notes (Signed)
[facility]-EMERGENCY DEPT Provider Note   CSN: [id number] Arrival date & time: 03/15/22  1814     History {Add pertinent medical, surgical, social history, OB history to HPI:1} Chief Complaint  Patient presents with   Shortness of Breath    BRITTINI BRUBECK is a 67 y.o. 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  Patient has been using portable oxygen tank hours out at home, she has rales throughout the lung fields.  She reports that she has been being managed outpatient by her oncologist and they encouraged her to, and if her symptoms worsen.  She denies any chest pain, but does endorse some generalized body pain.  She denies any hemoptysis.   Shortness of Breath Associated symptoms: cough        Home Medications Prior to Admission medications   Medication Sig Start Date End Date Taking? Authorizing Provider  adagrasib (KRAZATI) 200 MG tablet Take 2 tablets (400 mg total) by mouth 2 (two) times daily. 03/13/22   Heilingoetter, Cassandra L, PA-C  albuterol (PROVENTIL) (5 MG/ML) 0.5% nebulizer solution Take 2.5 mg by nebulization every 6 (six) hours as needed for wheezing.    [provider]  albuterol (VENTOLIN HFA) 108 (90 Base) MCG/ACT inhaler Inhale 1 puff into the lungs every 6 (six) hours as needed (wheezing/shortness of breath.).     [provider]  ALPRAZolam Prudy Feeler) 0.25 MG tablet Take 1 tablet (0.25 mg total) by mouth at bedtime as needed for anxiety. Take 1 tablets 30 minutes prior to MRI. Have a driver. 03/03/22   Si Gaul, MD  aspirin 81 MG chewable tablet Chew 1 tablet (81 mg total) by mouth daily. 12/07/12   Barrett, Joline Salt, PA-C  Budeson-Glycopyrrol-Formoterol (BREZTRI AEROSPHERE) 160-9-4.8 MCG/ACT AERO Inhale 2 puffs into the lungs in the morning and at bedtime. 10/14/21   Icard, Rachel Bo, DO   buPROPion (WELLBUTRIN SR) 150 MG 12 hr tablet Take 150 mg by mouth daily.     [provider]  doxycycline (VIBRA-TABS) 100 MG tablet Take 1 tablet (100 mg total) by mouth 2 (two) times daily. 03/10/22   Heilingoetter, Cassandra L, PA-C  HYDROcodone bit-homatropine (HYCODAN) 5-1.5 MG/5ML syrup Take 5 mLs by mouth every 6 (six) hours as needed for cough. 03/11/22   Heilingoetter, Cassandra L, PA-C  ipratropium-albuterol (DUONEB) 0.5-2.5 (3) MG/3ML SOLN Inhale 3 mLs into the lungs every 6 (six) hours as needed (Asthma). 01/27/21   Parrett, Virgel Bouquet, NP  LORazepam (ATIVAN) 2 MG tablet Take 2 mg by mouth every evening.    [provider]  methylPREDNISolone (MEDROL DOSEPAK) 4 MG TBPK tablet Use as instructed 03/10/22   Heilingoetter, Cassandra L, PA-C  metoprolol tartrate (LOPRESSOR) 25 MG tablet TAKE 1/2 TABLET(12.5 MG) BY MOUTH TWICE DAILY 06/25/20   Kathleene Hazel, MD  nitroGLYCERIN (NITROSTAT) 0.4 MG SL tablet Place 1 tablet (0.4 mg total) under the tongue every 5 (five) minutes as needed for chest pain. 12/07/12   Barrett, Joline Salt, PA-C  ondansetron (ZOFRAN-ODT) 4 MG disintegrating tablet Take 1 tablet (4 mg total) by mouth every 8 (eight) hours as needed for nausea or vomiting. 02/16/22   Heilingoetter, Cassandra L, PA-C  Vitamin D, Ergocalciferol, (DRISDOL) 1.25 MG (50000 UT) CAPS capsule Take 50,000 Units by mouth every 7 (seven) days. Wednesdays 02/12/19   [provider]      Allergies  Ciprofloxacin, Levofloxacin, Statins, Tricor [fenofibrate], Compazine [prochlorperazine], Latex, Codeine, and Metoprolol    Review of Systems   Review of Systems  Respiratory:  Positive for cough and shortness of breath.   All other systems reviewed and are negative.   Physical Exam Updated Vital Signs Temp 98.7 F (37.1 C) (Oral)   Ht 5\' 7"  (1.702 m)   Wt 79.4 kg   BMI 27.41 kg/m  Physical Exam Vitals and nursing note reviewed.  Constitutional:      General: She  is not in acute distress.    Appearance: Normal appearance. She is ill-appearing.  HENT:     Head: Normocephalic and atraumatic.  Eyes:     General:        Right eye: No discharge.        Left eye: No discharge.  Cardiovascular:     Rate and Rhythm: Normal rate and regular rhythm.     Heart sounds: No murmur heard.    No friction rub. No gallop.  Pulmonary:     Effort: Pulmonary effort is normal.     Breath sounds: Normal breath sounds.     Comments: Patient with coarse rales, rhonchi throughout, no noted focal consolidation, she has some tachypnea, increased respiratory effort without overt respiratory distress, no wheezing, stridor noted. Abdominal:     General: Bowel sounds are normal.     Palpations: Abdomen is soft.  Skin:    General: Skin is warm and dry.     Capillary Refill: Capillary refill takes less than 2 seconds.  Neurological:     Mental Status: She is alert and oriented to person, place, and time.  Psychiatric:        Mood and Affect: Mood normal.        Behavior: Behavior normal.     ED Results / Procedures / Treatments   Labs (all labs ordered are listed, but only abnormal results are displayed) Labs Reviewed  RESP PANEL BY RT-PCR (RSV, FLU A&B, COVID)  [id number]  BLOOD GAS, VENOUS  CBC WITH DIFFERENTIAL/PLATELET  BASIC METABOLIC PANEL  LACTIC ACID, PLASMA  LACTIC ACID, PLASMA    EKG None  Radiology No results found.  Procedures Procedures  {Document cardiac monitor, telemetry assessment procedure when appropriate:1}  Medications Ordered in ED Medications  morphine (PF) 2 MG/ML injection 2 mg (has no administration in time range)  ondansetron (ZOFRAN) injection 4 mg (has no administration in time range)    ED Course/ Medical Decision Making/ A&P   {   Click here for ABCD2, HEART and other calculatorsREFRESH Note before signing :1}                          Medical Decision Making Amount and/or Complexity of Data Reviewed Labs:  ordered. Radiology: ordered.  Risk Prescription drug management. Decision regarding hospitalization.   This patient is a 67 y.o. female who presents to the ED for concern of ***, this involves an extensive number of treatment options, and is a complaint that carries with it a high risk of complications and morbidity. The emergent differential diagnosis prior to evaluation includes, but is not limited to,  *** . This is not an exhaustive differential.   Past Medical History / Co-morbidities / Social History: ***  Additional history: Chart reviewed. Pertinent results include: ***  Physical Exam: Physical exam performed. The pertinent findings include: ***  Lab Tests: I ordered, and personally interpreted labs.  The pertinent results include:  ***  Imaging Studies: I ordered imaging studies including ***. I independently visualized and interpreted imaging which showed ***. I agree with the radiologist interpretation.   Cardiac Monitoring:  The patient was maintained on a cardiac monitor.  My attending physician Dr. Marland Kitchen viewed and interpreted the cardiac monitored which showed an underlying rhythm of: ***. I agree with this interpretation.   Medications: I ordered medication including ***  for ***. Reevaluation of the patient after these medicines showed that the patient {resolved/improved/worsened:23923::"improved"}. I have reviewed the patients home medicines and have made adjustments as needed.  Consultations Obtained: I requested consultation with the ***,  and discussed lab and imaging findings as well as pertinent plan - they recommend: ***   Disposition: After consideration of the diagnostic results and the patients response to treatment, I feel that *** .   ***emergency department workup does not suggest an emergent condition requiring admission or immediate intervention beyond what has been performed at this time. The plan is: ***. The patient is safe for discharge and has  been instructed to return immediately for worsening symptoms, change in symptoms or any other concerns.  I discussed this case with my attending physician Dr. Marland Kitchen who cosigned this note including patient's presenting symptoms, physical exam, and planned diagnostics and interventions. Attending physician stated agreement with plan or made changes to plan which were implemented.    Final Clinical Impression(s) / ED Diagnoses Final diagnoses:  None    Rx / DC Orders ED Discharge Orders     None

## 2022-03-16 ENCOUNTER — Other Ambulatory Visit (HOSPITAL_COMMUNITY): Payer: Self-pay

## 2022-03-16 DIAGNOSIS — R0602 Shortness of breath: Secondary | ICD-10-CM | POA: Diagnosis not present

## 2022-03-16 DIAGNOSIS — C3490 Malignant neoplasm of unspecified part of unspecified bronchus or lung: Secondary | ICD-10-CM

## 2022-03-16 DIAGNOSIS — J101 Influenza due to other identified influenza virus with other respiratory manifestations: Secondary | ICD-10-CM | POA: Diagnosis not present

## 2022-03-16 LAB — COMPREHENSIVE METABOLIC PANEL
ALT: 119 U/L — ABNORMAL HIGH (ref 0–44)
AST: 117 U/L — ABNORMAL HIGH (ref 15–41)
Albumin: 3.1 g/dL — ABNORMAL LOW (ref 3.5–5.0)
Alkaline Phosphatase: 117 U/L (ref 38–126)
Anion gap: 5 (ref 5–15)
BUN: 58 mg/dL — ABNORMAL HIGH (ref 8–23)
CO2: 20 mmol/L — ABNORMAL LOW (ref 22–32)
Calcium: 7.7 mg/dL — ABNORMAL LOW (ref 8.9–10.3)
Chloride: 107 mmol/L (ref 98–111)
Creatinine, Ser: 2.42 mg/dL — ABNORMAL HIGH (ref 0.44–1.00)
GFR, Estimated: 21 mL/min — ABNORMAL LOW (ref >60)
Glucose, Bld: 354 mg/dL — ABNORMAL HIGH (ref 70–99)
Potassium: 5.3 mmol/L — ABNORMAL HIGH (ref 3.5–5.1)
Sodium: 132 mmol/L — ABNORMAL LOW (ref 135–145)
Total Bilirubin: 0.9 mg/dL (ref 0.3–1.2)
Total Protein: 6.1 g/dL — ABNORMAL LOW (ref 6.5–8.1)

## 2022-03-16 LAB — CBC
HCT: 26.6 % — ABNORMAL LOW (ref 36.0–46.0)
HCT: 29.1 % — ABNORMAL LOW (ref 36.0–46.0)
Hemoglobin: 8.2 g/dL — ABNORMAL LOW (ref 12.0–15.0)
Hemoglobin: 9.2 g/dL — ABNORMAL LOW (ref 12.0–15.0)
MCH: 30 pg (ref 26.0–34.0)
MCH: 31 pg (ref 26.0–34.0)
MCHC: 30.8 g/dL (ref 30.0–36.0)
MCHC: 31.6 g/dL (ref 30.0–36.0)
MCV: 97.4 fL (ref 80.0–100.0)
MCV: 98 fL (ref 80.0–100.0)
Platelets: 240 10*3/uL (ref 150–400)
Platelets: 266 10*3/uL (ref 150–400)
RBC: 2.73 MIL/uL — ABNORMAL LOW (ref 3.87–5.11)
RBC: 2.97 MIL/uL — ABNORMAL LOW (ref 3.87–5.11)
RDW: 15.2 % (ref 11.5–15.5)
RDW: 15.4 % (ref 11.5–15.5)
WBC: 13.9 10*3/uL — ABNORMAL HIGH (ref 4.0–10.5)
WBC: 15.7 10*3/uL — ABNORMAL HIGH (ref 4.0–10.5)
nRBC: 0 % (ref 0.0–0.2)
nRBC: 0 % (ref 0.0–0.2)

## 2022-03-16 LAB — GLUCOSE, CAPILLARY
Glucose-Capillary: 416 mg/dL — ABNORMAL HIGH (ref 70–99)
Glucose-Capillary: 422 mg/dL — ABNORMAL HIGH (ref 70–99)

## 2022-03-16 LAB — POTASSIUM: Potassium: 4.4 mmol/L (ref 3.5–5.1)

## 2022-03-16 LAB — HEMOGLOBIN A1C
Hgb A1c MFr Bld: 6.6 % — ABNORMAL HIGH (ref 4.8–5.6)
Mean Plasma Glucose: 142.72 mg/dL

## 2022-03-16 LAB — HIV ANTIBODY (ROUTINE TESTING W REFLEX): HIV Screen 4th Generation wRfx: NONREACTIVE

## 2022-03-16 LAB — CREATININE, SERUM
Creatinine, Ser: 2.29 mg/dL — ABNORMAL HIGH (ref 0.44–1.00)
GFR, Estimated: 23 mL/min — ABNORMAL LOW (ref >60)

## 2022-03-16 MED ORDER — INSULIN ASPART 100 UNIT/ML IJ SOLN
0.0000 [IU] | Freq: Three times a day (TID) | INTRAMUSCULAR | Status: DC
  Administered 2022-03-17: 5 [IU] via SUBCUTANEOUS

## 2022-03-16 MED ORDER — IPRATROPIUM-ALBUTEROL 0.5-2.5 (3) MG/3ML IN SOLN
3.0000 mL | RESPIRATORY_TRACT | Status: DC | PRN
  Administered 2022-03-16: 3 mL via RESPIRATORY_TRACT
  Filled 2022-03-16: qty 3

## 2022-03-16 MED ORDER — FLUTICASONE FUROATE-VILANTEROL 100-25 MCG/ACT IN AEPB
1.0000 | INHALATION_SPRAY | Freq: Every day | RESPIRATORY_TRACT | Status: DC
  Administered 2022-03-17 – 2022-03-31 (×15): 1 via RESPIRATORY_TRACT
  Filled 2022-03-16 (×2): qty 28

## 2022-03-16 MED ORDER — SODIUM ZIRCONIUM CYCLOSILICATE 10 G PO PACK
10.0000 g | PACK | Freq: Two times a day (BID) | ORAL | Status: DC
  Administered 2022-03-16: 10 g via ORAL
  Filled 2022-03-16 (×2): qty 1

## 2022-03-16 MED ORDER — ALBUTEROL SULFATE (2.5 MG/3ML) 0.083% IN NEBU
2.5000 mg | INHALATION_SOLUTION | Freq: Four times a day (QID) | RESPIRATORY_TRACT | Status: DC | PRN
  Administered 2022-03-19 – 2022-03-24 (×3): 2.5 mg via RESPIRATORY_TRACT
  Filled 2022-03-16 (×3): qty 3

## 2022-03-16 MED ORDER — UMECLIDINIUM BROMIDE 62.5 MCG/ACT IN AEPB
1.0000 | INHALATION_SPRAY | Freq: Every day | RESPIRATORY_TRACT | Status: DC
  Administered 2022-03-17 – 2022-03-31 (×15): 1 via RESPIRATORY_TRACT
  Filled 2022-03-16 (×2): qty 7

## 2022-03-16 MED ORDER — INSULIN ASPART 100 UNIT/ML IJ SOLN
5.0000 [IU] | Freq: Once | INTRAMUSCULAR | Status: AC
  Administered 2022-03-16: 5 [IU] via SUBCUTANEOUS

## 2022-03-16 MED ORDER — ALBUTEROL SULFATE (2.5 MG/3ML) 0.083% IN NEBU
2.5000 mg | INHALATION_SOLUTION | Freq: Four times a day (QID) | RESPIRATORY_TRACT | Status: DC
  Administered 2022-03-17 – 2022-03-18 (×6): 2.5 mg via RESPIRATORY_TRACT
  Filled 2022-03-16 (×6): qty 3

## 2022-03-16 NOTE — ED Notes (Signed)
Last BP 91/51. Lopressor held.

## 2022-03-16 NOTE — Progress Notes (Signed)
[facility]                                                                               Corey Caulfield, is a 67 y.o. female, DOB - 26-Dec-1955, VGV:[id number] Admit date - 03/15/2022    Outpatient Primary MD for the patient is [name], NP  LOS - 1  days    Brief summary    [name] is a 67 y.o. 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  Her symptoms started about 5 days ago.  She was seen by her oncologist who prescribed doxycycline however her symptoms progressively worsened . patient had been off of chemotherapy for about 2 years until approximately 1 month ago when she started a new oral chemotherapy.  Patient was admitted for further management of influenza A, AKI likely secondary to dehydration.    Assessment & Plan    Assessment and Plan:  Acute on chronic respiratory failure secondary to influenza A in the setting of stage 4 adenocarcinoma of the lung. Continue with tamiflu, currently requiring about 4 lit of [place] oxygen.  At home she is between 2.5 to 3 lit [place] .  Symptomatic management with nebs, cough meds.  Continue with IV solumedrol 40 mg Q12 hrs.  Follow wbc count, follow up blood cultures. Lactic acid wnl.    Stage 4 adenocarcinoma  of the lung Continue with adagrasib.     COPD;  Scattered wheezing heard.  Resume Breo and incurse ellipta.    Hypertension:  Well controlled.    Acute Kidney injury: suspect pre renal azotemia Baseline creatinine at between 1.4 to 2.  Admitted with a creatinine of 2.3 with slight worsening to 2.4. Continue with IV fluids and recheck renal parameters in am.    Hyperkalemia:  Lokelma ordered. Recheck K tonight.    Elevated Liver enzymes:  Unclear  etiology,  Monitor.    Anemia of chronic disease:  Hemoglobin baseline around 9, on admission at 8.2.   Hyperglycemia: probably from steroids.  A1c is ordered.  Continue with SSI   GERD stable.     Estimated body mass index is 27.41 kg/m as calculated from the following:   Height as of this encounter: 5\' 7"  (1.702 m).   Weight as of this encounter: 79.4 kg.  Code Status: full code.  DVT Prophylaxis:  enoxaparin (LOVENOX) injection 30 mg Start: 03/16/22 1000 SCDs Start: 03/15/22 2335   Level of Care: Level of care: Med-Surg Family Communication: none at bedside.   Disposition Plan:     Remains inpatient appropriate:  pending clinical improvement.   Procedures:  None.   Consultants:   None.   Antimicrobials:   Anti-infectives (From admission, onward)    Start     Dose/Rate Route Frequency Ordered Stop   03/16/22 1000  oseltamivir (TAMIFLU) capsule 30 mg        30 mg Oral Daily 03/15/22 2359 03/20/22 0959   03/15/22 2345  oseltamivir (TAMIFLU) capsule 75  mg  Status:  Discontinued        75 mg Oral 2 times daily 03/15/22 2334 03/15/22 2358        Medications  Scheduled Meds:  adagrasib  400 mg Oral BID   aspirin  81 mg Oral Daily   buPROPion  150 mg Oral Daily   enoxaparin (LOVENOX) injection  30 mg Subcutaneous Q24H   fluticasone furoate-vilanterol  1 puff Inhalation Daily   And   umeclidinium bromide  1 puff Inhalation Daily   LORazepam  2 mg Oral QPM   methylPREDNISolone (SOLU-MEDROL) injection  40 mg Intravenous Q12H   metoprolol tartrate  12.5 mg Oral BID   oseltamivir  30 mg Oral Daily   sodium chloride flush  3 mL Intravenous Q12H   sodium zirconium cyclosilicate  10 g Oral BID   Continuous Infusions:  sodium chloride Stopped (03/16/22 1421)   PRN Meds:.acetaminophen **OR** acetaminophen, bisacodyl, hydrALAZINE, HYDROcodone bit-homatropine, ipratropium-albuterol, nitroGLYCERIN, ondansetron (ZOFRAN) IV, ondansetron **OR** ondansetron (ZOFRAN)  IV, senna-docusate    Subjective:   [name] was seen and examined today.  Reports feeling sob, and has occasional cough.   Objective:   Vitals:   03/16/22 1023 03/16/22 1030 03/16/22 1334 03/16/22 1530  BP:  110/64 97/64 106/61  Pulse:  61 68 69  Resp:  (!) 21 (!) 22 20  Temp: 97.7 F (36.5 C)  97.6 F (36.4 C)   TempSrc: Oral  Oral   SpO2:  100% 99% 98%  Weight:      Height:        Intake/Output Summary (Last 24 hours) at 03/16/2022 1648 Last data filed at 03/15/2022 2217 Gross per 24 hour  Intake 1000 ml  Output --  Net 1000 ml   Filed Weights   03/15/22 1831  Weight: 79.4 kg     Exam General: Alert and oriented x 3, NAD Cardiovascular: S1 S2 auscultated, no murmurs, RRR Respiratory: Clear to auscultation bilaterally, no wheezing, rales or rhonchi Gastrointestinal: Soft, nontender, nondistended, + bowel sounds Ext: no pedal edema bilaterally Neuro: AAOx3, Cr N's II- XII. Strength 5/5 upper and lower extremities bilaterally Skin: No rashes Psych: Normal affect and demeanor, alert and oriented x3    Data Reviewed:  I have personally reviewed following labs and imaging studies   CBC Lab Results  Component Value Date   WBC 13.9 (H) 03/16/2022   RBC 2.73 (L) 03/16/2022   HGB 8.2 (L) 03/16/2022   HCT 26.6 (L) 03/16/2022   MCV 97.4 03/16/2022   MCH 30.0 03/16/2022   PLT 240 03/16/2022   MCHC 30.8 03/16/2022   RDW 15.4 03/16/2022   LYMPHSABS 1.2 03/15/2022   MONOABS 0.9 03/15/2022   EOSABS 0.0 03/15/2022   BASOSABS 0.0 03/15/2022     Last metabolic panel Lab Results  Component Value Date   NA 132 (L) 03/16/2022   K 5.3 (H) 03/16/2022   CL 107 03/16/2022   CO2 20 (L) 03/16/2022   BUN 58 (H) 03/16/2022   CREATININE 2.42 (H) 03/16/2022   GLUCOSE 354 (H) 03/16/2022   GFRNONAA 21 (L) 03/16/2022   GFRAA 48 (L) 10/27/2019   CALCIUM 7.7 (L) 03/16/2022   PROT 6.1 (L) 03/16/2022   ALBUMIN 3.1 (L) 03/16/2022   LABGLOB 2.5 10/06/2016   AGRATIO 1.8  10/06/2016   BILITOT 0.9 03/16/2022   ALKPHOS 117 03/16/2022   AST 117 (H) 03/16/2022   ALT 119 (H) 03/16/2022   ANIONGAP 5 03/16/2022    CBG (last 3)  No  results for input(s): "GLUCAP" in the last 72 hours.    Coagulation Profile: No results for input(s): "INR", "PROTIME" in the last 168 hours.   Radiology Studies: DG Chest 2 View  Result Date: 03/15/2022 CLINICAL DATA:  shob EXAM: CHEST - 2 VIEW COMPARISON:  October 04, 2018., February 12, 2022 FINDINGS: The cardiomediastinal silhouette is unchanged in contour.RIGHT chest port with tip terminating over the superior cavoatrial junction. No pleural effusion. No pneumothorax. No acute pleuroparenchymal abnormality. Known bilateral pulmonary nodularity is better assessed on recent PET-CT. Visualized abdomen is unremarkable. Similar minimal wedging of a upper thoracic vertebral body. IMPRESSION: 1. No acute cardiopulmonary abnormality. 2. Known bilateral pulmonary nodularity is better assessed on recent PET-CT. Electronically Signed   By: Meda Klinefelter M.D.   On: 03/15/2022 19:14       Kathlen Mody M.D. [facility] 03/16/2022, 4:48 PM  Available via Epic secure chat 7am-7pm After 7 pm, please refer to night coverage provider listed on amion.

## 2022-03-17 DIAGNOSIS — R0602 Shortness of breath: Secondary | ICD-10-CM | POA: Diagnosis not present

## 2022-03-17 DIAGNOSIS — C3490 Malignant neoplasm of unspecified part of unspecified bronchus or lung: Secondary | ICD-10-CM | POA: Diagnosis not present

## 2022-03-17 DIAGNOSIS — J101 Influenza due to other identified influenza virus with other respiratory manifestations: Secondary | ICD-10-CM | POA: Diagnosis not present

## 2022-03-17 LAB — GLUCOSE, CAPILLARY
Glucose-Capillary: 288 mg/dL — ABNORMAL HIGH (ref 70–99)
Glucose-Capillary: 272 mg/dL — ABNORMAL HIGH (ref 70–99)
Glucose-Capillary: 303 mg/dL — ABNORMAL HIGH (ref 70–99)
Glucose-Capillary: 250 mg/dL — ABNORMAL HIGH (ref 70–99)
Glucose-Capillary: 272 mg/dL — ABNORMAL HIGH (ref 70–99)

## 2022-03-17 LAB — CBC WITH DIFFERENTIAL/PLATELET
Abs Immature Granulocytes: 0.19 10*3/uL — ABNORMAL HIGH (ref 0.00–0.07)
Basophils Absolute: 0 10*3/uL (ref 0.0–0.1)
Basophils Relative: 0 %
Eosinophils Absolute: 0 10*3/uL (ref 0.0–0.5)
Eosinophils Relative: 0 %
HCT: 24.2 % — ABNORMAL LOW (ref 36.0–46.0)
Hemoglobin: 7.6 g/dL — ABNORMAL LOW (ref 12.0–15.0)
Immature Granulocytes: 2 %
Lymphocytes Relative: 4 %
Lymphs Abs: 0.5 10*3/uL — ABNORMAL LOW (ref 0.7–4.0)
MCH: 30.6 pg (ref 26.0–34.0)
MCHC: 31.4 g/dL (ref 30.0–36.0)
MCV: 97.6 fL (ref 80.0–100.0)
Monocytes Absolute: 0.4 10*3/uL (ref 0.1–1.0)
Monocytes Relative: 4 %
Neutro Abs: 10 10*3/uL — ABNORMAL HIGH (ref 1.7–7.7)
Neutrophils Relative %: 90 %
Platelets: 230 10*3/uL (ref 150–400)
RBC: 2.48 MIL/uL — ABNORMAL LOW (ref 3.87–5.11)
RDW: 15.1 % (ref 11.5–15.5)
WBC: 11.1 10*3/uL — ABNORMAL HIGH (ref 4.0–10.5)
nRBC: 0 % (ref 0.0–0.2)

## 2022-03-17 LAB — BASIC METABOLIC PANEL
Anion gap: 8 (ref 5–15)
BUN: 53 mg/dL — ABNORMAL HIGH (ref 8–23)
CO2: 20 mmol/L — ABNORMAL LOW (ref 22–32)
Calcium: 8.4 mg/dL — ABNORMAL LOW (ref 8.9–10.3)
Chloride: 109 mmol/L (ref 98–111)
Creatinine, Ser: 2 mg/dL — ABNORMAL HIGH (ref 0.44–1.00)
GFR, Estimated: 27 mL/min — ABNORMAL LOW (ref >60)
Glucose, Bld: 304 mg/dL — ABNORMAL HIGH (ref 70–99)
Potassium: 4.7 mmol/L (ref 3.5–5.1)
Sodium: 137 mmol/L (ref 135–145)

## 2022-03-17 MED ORDER — CHLORHEXIDINE GLUCONATE CLOTH 2 % EX PADS
6.0000 | MEDICATED_PAD | Freq: Every day | CUTANEOUS | Status: DC
  Administered 2022-03-17 – 2022-03-31 (×14): 6 via TOPICAL

## 2022-03-17 MED ORDER — SODIUM CHLORIDE 0.9% FLUSH
10.0000 mL | Freq: Two times a day (BID) | INTRAVENOUS | Status: DC
  Administered 2022-03-17 – 2022-03-28 (×14): 10 mL

## 2022-03-17 MED ORDER — INSULIN ASPART 100 UNIT/ML IJ SOLN
0.0000 [IU] | Freq: Three times a day (TID) | INTRAMUSCULAR | Status: DC
  Administered 2022-03-17 (×2): 11 [IU] via SUBCUTANEOUS
  Administered 2022-03-18: 5 [IU] via SUBCUTANEOUS
  Administered 2022-03-18 – 2022-03-19 (×4): 8 [IU] via SUBCUTANEOUS
  Administered 2022-03-19: 11 [IU] via SUBCUTANEOUS
  Administered 2022-03-20: 3 [IU] via SUBCUTANEOUS
  Administered 2022-03-20 – 2022-03-21 (×3): 8 [IU] via SUBCUTANEOUS
  Administered 2022-03-21: 3 [IU] via SUBCUTANEOUS
  Administered 2022-03-21: 5 [IU] via SUBCUTANEOUS
  Administered 2022-03-22: 3 [IU] via SUBCUTANEOUS
  Administered 2022-03-22: 2 [IU] via SUBCUTANEOUS
  Administered 2022-03-22 – 2022-03-23 (×2): 5 [IU] via SUBCUTANEOUS

## 2022-03-17 MED ORDER — INSULIN ASPART 100 UNIT/ML IJ SOLN
0.0000 [IU] | Freq: Every day | INTRAMUSCULAR | Status: DC
  Administered 2022-03-17 – 2022-03-19 (×3): 3 [IU] via SUBCUTANEOUS
  Administered 2022-03-20: 2 [IU] via SUBCUTANEOUS
  Administered 2022-03-21 – 2022-03-25 (×3): 3 [IU] via SUBCUTANEOUS

## 2022-03-17 MED ORDER — HYDROCODONE BIT-HOMATROP MBR 5-1.5 MG/5ML PO SOLN
5.0000 mL | ORAL | Status: DC | PRN
  Administered 2022-03-17 – 2022-03-30 (×38): 5 mL via ORAL
  Filled 2022-03-17 (×39): qty 5

## 2022-03-17 MED ORDER — BENZONATATE 100 MG PO CAPS
200.0000 mg | ORAL_CAPSULE | Freq: Three times a day (TID) | ORAL | Status: DC | PRN
  Administered 2022-03-17 – 2022-03-30 (×10): 200 mg via ORAL
  Filled 2022-03-17 (×14): qty 2

## 2022-03-17 MED ORDER — SODIUM CHLORIDE 0.9% FLUSH: 10.0000 mL | INTRAVENOUS | Status: DC | PRN

## 2022-03-17 NOTE — TOC Initial Note (Signed)
Transition of Care [facility]) - Initial/Assessment Note    Patient Details  Name: [name] MRN: [id number] Date of Birth: December 09, 1955  Transition of Care [facility]) CM/SW Contact:    Beckie Busing, RN Phone Number:[phone]  03/17/2022, 2:01 PM  Clinical Narrative:                  Transition of Care [facility]) Screening Note   Patient Details  Name: [name] Date of Birth: 1956-02-18   Transition of Care [facility]) CM/SW Contact:    Beckie Busing, RN Phone Number: 03/17/2022, 2:01 PM    Transition of Care Department [facility]) has reviewed patient and no TOC needs have been identified at this time. We will continue to monitor patient advancement through interdisciplinary progression rounds. If new patient transition needs arise, please place a TOC consult.          Patient Goals and CMS Choice            Expected Discharge Plan and Services                                              Prior Living Arrangements/Services                       Activities of Daily Living Home Assistive Devices/Equipment: Environmental consultant (specify type), Wheelchair, Nebulizer, Oxygen, Dentures (specify type), Eyeglasses ADL Screening (condition at time of admission) Patient's cognitive ability adequate to safely complete daily activities?: Yes Is the patient deaf or have difficulty hearing?: No Does the patient have difficulty seeing, even when wearing glasses/contacts?: No Does the patient have difficulty concentrating, remembering, or making decisions?: No Patient able to express need for assistance with ADLs?: No Does the patient have difficulty dressing or bathing?: No Independently performs ADLs?: Yes (appropriate for developmental age) Does the patient have difficulty walking or climbing stairs?: No Weakness of Legs: None Weakness of Arms/Hands: None  Permission Sought/Granted                  Emotional Assessment              Admission diagnosis:  Flu  [J11.1] SOB (shortness of breath) [R06.02] Influenza A [J10.1] Patient Active Problem List   Diagnosis Date Noted   Influenza A 03/15/2022   COPD (chronic obstructive pulmonary disease) (HCC) 01/27/2021   Chronic respiratory failure with hypoxia (HCC) 01/27/2021   Anemia associated with chemotherapy 10/07/2018   Thrombocytopenia (HCC) 10/07/2018   Acute pyelonephritis 10/06/2018   Acute lower UTI    Diarrhea 06/20/2018   Tardive dyskinesia 06/20/2018   Medication side effect 06/20/2018   Nausea and vomiting 05/11/2018   AKI (acute kidney injury) (HCC) 05/11/2018   Port-A-Cath in place 05/02/2018   Adenocarcinoma of left lung, stage 4 (HCC) 04/05/2018   Encounter for antineoplastic chemotherapy 04/05/2018   Encounter for antineoplastic immunotherapy 04/05/2018   Goals of care, counseling/discussion 04/05/2018   Tobacco abuse counseling 03/05/2018   PVC's (premature ventricular contractions)    SVT (supraventricular tachycardia)    Premature atrial contractions    Unstable angina (HCC) 12/07/2012   Tachycardia 02/19/2009   CAD, NATIVE VESSEL 06/04/2008   Hyperlipemia 05/29/2008   DEPRESSION 05/29/2008   Asthma 05/29/2008   GERD 05/29/2008   Irritable bowel syndrome 05/29/2008   Spondylosis 05/29/2008   FIBROMYALGIA 05/29/2008   CHEST PAIN-PRECORDIAL  05/07/2008   PCP:  [name], NP Pharmacy:   [facility] [phone] [place], [place] - [address] AT [facility] [address] [facility] [address] [place] [address] Phone: [phone] Fax: [phone]  [facility] [facility] [address] - [place], [place] - [address] [place] [address] Phone: [phone] Fax: [phone]  [place] - [facility] [address] [place] [address] Phone: [phone] Fax: [phone]  [id number] [place], [place] - [address] AT [facility] OF [facility] [address] [facility] [address] [place] [address] Phone: [phone] Fax: [phone]     Social Determinants of Health (SDOH) Social History: SDOH Screenings   Food Insecurity: No Food Insecurity (03/16/2022)  Housing: Low Risk  (03/16/2022)  Transportation Needs: No Transportation Needs (03/16/2022)  Utilities: Not At Risk (03/16/2022)  Tobacco Use: Medium Risk (03/15/2022)   SDOH Interventions:     Readmission Risk Interventions     No data to display

## 2022-03-17 NOTE — Inpatient Diabetes Management (Signed)
Inpatient Diabetes Program Recommendations  AACE/ADA: New Consensus Statement on Inpatient Glycemic Control (2015)  Target Ranges:  Prepandial:   less than 140 mg/dL      Peak postprandial:   less than 180 mg/dL (1-2 hours)      Critically ill patients:  140 - 180 mg/dL   Lab Results  Component Value Date   GLUCAP 288 (H) 03/17/2022   HGBA1C 6.6 (H) 03/16/2022    Review of Glycemic Control  Diabetes history: No hx DM Outpatient Diabetes medications: None Current orders for Inpatient glycemic control: Novolog 0-9 units TID On Solumedrol 40 Q12H  HgbA1C - 6.6% On Heart-healthy diet  Inpatient Diabetes Program Recommendations:    Increase Novolog to 0-15 TID with meals and 0-5 HS while on steroids  Add CHO-mod to heart healthy diet  HgbA1C - 6.6% ? diagnosis DM2  Continue to watch glucose trends.  Thank you. Ailene Ards, RD, LDN, CDCES Inpatient Diabetes Coordinator [phone]

## 2022-03-17 NOTE — Progress Notes (Signed)
[facility]                                                                               [name], is a 67 y.o. female, DOB - 1955-04-09, NKN:[id number] Admit date - 03/15/2022    Outpatient Primary MD for the patient is [name], NP  LOS - 2  days    Brief summary    [name] is a 67 y.o. 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  Her symptoms started about 5 days ago.  She was seen by her oncologist who prescribed doxycycline however her symptoms progressively worsened . patient had been off of chemotherapy for about 2 years until approximately 1 month ago when she started a new oral chemotherapy.  Patient was admitted for further management of influenza A, AKI likely secondary to dehydration.    Assessment & Plan    Assessment and Plan:  Acute on chronic respiratory failure secondary to influenza A in the setting of stage 4 adenocarcinoma of the lung. Continue with tamiflu, currently requiring about 4 lit of [place] oxygen.  At home she is between 2.5 to 3 lit [place] .  Symptomatic management with nebs, cough meds.  Continue with IV solumedrol 40 mg Q12 hrs. On exam , wheezing has improved. Possible transition to oral steroids in am.  Follow wbc count, follow up blood cultures. Lactic acid wnl.  WBC count is improving   Stage 4 adenocarcinoma  of the lung Continue with adagrasib on discharge. Follow up withDr Arbutus Ped.     COPD;  Wheezing has improved. On IV solumedrol. Possible transition to prednisone in am.  Resume Breo and incurse ellipta.    Hypertension:  Well controlled.    Acute Kidney injury: suspect pre renal azotemia Baseline creatinine at between 1.4 to 2.  Admitted with a creatinine  of 2.3  , improved to 2 today.  Continue with IV fluids and recheck renal parameters in am.    Hyperkalemia:  Resolved with Lokelma.    Elevated Liver enzymes:  Unclear etiology,  Monitor. Recheck liver panel in am.    Anemia of chronic disease:  Hemoglobin baseline around 9, on admission at 8.2 dropped further to 7.6.  NO obvious bleeding seen.  Transfuse to keep hemoglobin greater than 7.    Hyperglycemia: probably from steroids. /Newly diagnosed diabetes mellitus CBG (last 3)  Recent Labs    03/17/22 0321 03/17/22 0749 03/17/22 1132  GLUCAP 288* 272* 303*   Continue with SSI, add novolog 3 units TIDAC, IF she eats greater than 50% of her meals.  Hemoglobin A1c at 6.6   GERD stable.     Estimated body mass index is 27.41 kg/m as calculated from the following:   Height as of this encounter: 5\' 7"  (1.702 m).   Weight as of this encounter: 79.4 kg.  Code Status: full code.  DVT Prophylaxis:  enoxaparin (LOVENOX) injection 30 mg Start: 03/16/22 1000 SCDs Start: 03/15/22 2335  Level of Care: Level of care: Med-Surg Family Communication: Family at side  Disposition Plan:     Remains inpatient appropriate:  pending clinical improvement.   Procedures:  None.   Consultants:   None.   Antimicrobials:   Anti-infectives (From admission, onward)    Start     Dose/Rate Route Frequency Ordered Stop   03/16/22 1000  oseltamivir (TAMIFLU) capsule 30 mg        30 mg Oral Daily 03/15/22 2359 03/20/22 0959   03/15/22 2345  oseltamivir (TAMIFLU) capsule 75 mg  Status:  Discontinued        75 mg Oral 2 times daily 03/15/22 2334 03/15/22 2358        Medications  Scheduled Meds:  adagrasib  400 mg Oral BID   albuterol  2.5 mg Nebulization QID   aspirin  81 mg Oral Daily   buPROPion  150 mg Oral Daily   Chlorhexidine Gluconate Cloth  6 each Topical Daily   enoxaparin (LOVENOX) injection  30 mg Subcutaneous Q24H   fluticasone furoate-vilanterol  1 puff  Inhalation Daily   And   umeclidinium bromide  1 puff Inhalation Daily   insulin aspart  0-15 Units Subcutaneous TID WC   insulin aspart  0-5 Units Subcutaneous QHS   LORazepam  2 mg Oral QPM   methylPREDNISolone (SOLU-MEDROL) injection  40 mg Intravenous Q12H   metoprolol tartrate  12.5 mg Oral BID   oseltamivir  30 mg Oral Daily   sodium chloride flush  10-40 mL Intracatheter Q12H   sodium chloride flush  3 mL Intravenous Q12H   Continuous Infusions:   PRN Meds:.acetaminophen **OR** acetaminophen, albuterol, benzonatate, bisacodyl, hydrALAZINE, HYDROcodone bit-homatropine, nitroGLYCERIN, ondansetron (ZOFRAN) IV, ondansetron **OR** ondansetron (ZOFRAN) IV, senna-docusate, sodium chloride flush    Subjective:   [name] was seen and examined today.  Reports her breathing is slowly improving but she is not back to baseline yet  Objective:   Vitals:   03/17/22 0319 03/17/22 0741 03/17/22 0746 03/17/22 1123  BP: 112/63     Pulse:      Resp:      Temp:      TempSrc:      SpO2:  99% 99% 99%  Weight:      Height:        Intake/Output Summary (Last 24 hours) at 03/17/2022 1222 Last data filed at 03/16/2022 1856 Gross per 24 hour  Intake 240 ml  Output 0 ml  Net 240 ml    Filed Weights   03/15/22 1831  Weight: 79.4 kg     Exam General exam: Appears calm and comfortable  Respiratory system: Wheezing has improved, air entry fair, on 4 L of nasal cannula oxygen Cardiovascular system: S1 & S2 heard, RRR. No JVD,  Gastrointestinal system: Abdomen is nondistended, soft and nontender.  Central nervous system: Alert and oriented. No focal neurological deficits. Extremities: Symmetric 5 x 5 power. Skin: No rashes,  Psychiatry:  Mood & affect appropriate.    Data Reviewed:  I have personally reviewed following labs and imaging studies   CBC Lab Results  Component Value Date   WBC 11.1 (H) 03/17/2022   RBC 2.48 (L) 03/17/2022   HGB 7.6 (L) 03/17/2022   HCT 24.2  (L) 03/17/2022   MCV 97.6 03/17/2022   MCH 30.6 03/17/2022   PLT 230 03/17/2022   MCHC 31.4 03/17/2022   RDW 15.1 03/17/2022   LYMPHSABS 0.5 (L) 03/17/2022   MONOABS 0.4 03/17/2022   EOSABS 0.0  03/17/2022   BASOSABS 0.0 03/17/2022     Last metabolic panel Lab Results  Component Value Date   NA 137 03/17/2022   K 4.7 03/17/2022   CL 109 03/17/2022   CO2 20 (L) 03/17/2022   BUN 53 (H) 03/17/2022   CREATININE 2.00 (H) 03/17/2022   GLUCOSE 304 (H) 03/17/2022   GFRNONAA 27 (L) 03/17/2022   GFRAA 48 (L) 10/27/2019   CALCIUM 8.4 (L) 03/17/2022   PROT 6.1 (L) 03/16/2022   ALBUMIN 3.1 (L) 03/16/2022   LABGLOB 2.5 10/06/2016   AGRATIO 1.8 10/06/2016   BILITOT 0.9 03/16/2022   ALKPHOS 117 03/16/2022   AST 117 (H) 03/16/2022   ALT 119 (H) 03/16/2022   ANIONGAP 8 03/17/2022    CBG (last 3)  Recent Labs    03/17/22 0321 03/17/22 0749 03/17/22 1132  GLUCAP 288* 272* 303*      Coagulation Profile: No results for input(s): "INR", "PROTIME" in the last 168 hours.   Radiology Studies: DG Chest 2 View  Result Date: 03/15/2022 CLINICAL DATA:  shob EXAM: CHEST - 2 VIEW COMPARISON:  October 04, 2018., February 12, 2022 FINDINGS: The cardiomediastinal silhouette is unchanged in contour.RIGHT chest port with tip terminating over the superior cavoatrial junction. No pleural effusion. No pneumothorax. No acute pleuroparenchymal abnormality. Known bilateral pulmonary nodularity is better assessed on recent PET-CT. Visualized abdomen is unremarkable. Similar minimal wedging of a upper thoracic vertebral body. IMPRESSION: 1. No acute cardiopulmonary abnormality. 2. Known bilateral pulmonary nodularity is better assessed on recent PET-CT. Electronically Signed   By: Meda Klinefelter M.D.   On: 03/15/2022 19:14       Kathlen Mody M.D. [facility] 03/17/2022, 12:22 PM  Available via Epic secure chat 7am-7pm After 7 pm, please refer to night coverage provider listed on  amion.

## 2022-03-18 ENCOUNTER — Other Ambulatory Visit: Payer: Managed Care, Other (non HMO)

## 2022-03-18 ENCOUNTER — Inpatient Hospital Stay (HOSPITAL_COMMUNITY): Payer: Managed Care, Other (non HMO)

## 2022-03-18 ENCOUNTER — Ambulatory Visit: Payer: Managed Care, Other (non HMO) | Admitting: Internal Medicine

## 2022-03-18 DIAGNOSIS — J101 Influenza due to other identified influenza virus with other respiratory manifestations: Secondary | ICD-10-CM | POA: Diagnosis not present

## 2022-03-18 LAB — GLUCOSE, CAPILLARY
Glucose-Capillary: 326 mg/dL — ABNORMAL HIGH (ref 70–99)
Glucose-Capillary: 272 mg/dL — ABNORMAL HIGH (ref 70–99)
Glucose-Capillary: 243 mg/dL — ABNORMAL HIGH (ref 70–99)
Glucose-Capillary: 289 mg/dL — ABNORMAL HIGH (ref 70–99)
Glucose-Capillary: 272 mg/dL — ABNORMAL HIGH (ref 70–99)

## 2022-03-18 MED ORDER — INSULIN GLARGINE-YFGN 100 UNIT/ML ~~LOC~~ SOLN
10.0000 [IU] | Freq: Every day | SUBCUTANEOUS | Status: DC
  Administered 2022-03-18: 10 [IU] via SUBCUTANEOUS
  Filled 2022-03-18 (×2): qty 0.1

## 2022-03-18 MED ORDER — INSULIN ASPART 100 UNIT/ML IJ SOLN
4.0000 [IU] | Freq: Once | INTRAMUSCULAR | Status: AC
  Administered 2022-03-18: 4 [IU] via SUBCUTANEOUS

## 2022-03-18 MED ORDER — ALBUTEROL SULFATE (2.5 MG/3ML) 0.083% IN NEBU
2.5000 mg | INHALATION_SOLUTION | Freq: Three times a day (TID) | RESPIRATORY_TRACT | Status: DC
  Filled 2022-03-18 (×2): qty 3

## 2022-03-18 NOTE — Evaluation (Addendum)
Physical Therapy Evaluation Patient Details Name: [name] MRN: [id number] DOB: 09/21/1955 Today's Date: 03/18/2022  History of Present Illness  [name] is a 67 y.o. female admitted for further management of influenza A, AKI likely secondary to dehydration. 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 Pt from home with spouse, normally home alone during the day and able to mobilize around the home with rollator and care for self, needing increased time with self care tasks and spouse completes household chores. Pt limited, demo good AROM seated EOB and good sitting balance EOB. Pt declines transferring due to recent transfer to [facility] and not wanting to cause increased coughing with movement. Pt reports being on 2-3L O2 at baseline. Currently recommending SNF due to pt's weakness and deconditioning, may be able to return home with improvement. Pt currently with functional limitations due to the deficits listed below (see PT Problem List). Pt will benefit from skilled PT to increase their independence and safety with mobility to allow discharge to the venue listed below.          Recommendations for follow up therapy are one component of a multi-disciplinary discharge planning process, led by the attending physician.  Recommendations may be updated based on patient status, additional functional criteria and insurance authorization.  Follow Up Recommendations Skilled nursing-short term rehab (<3 hours/day) (vs HHPT with progress)      Assistance Recommended at Discharge Frequent or constant Supervision/Assistance  Patient can return home with the following  A little help with walking and/or transfers;A little help with bathing/dressing/bathroom;Assistance with cooking/housework;Assist for transportation;Help  with stairs or ramp for entrance    Equipment Recommendations None recommended by PT  Recommendations for Other Services       Functional Status Assessment Patient has had a recent decline in their functional status and demonstrates the ability to make significant improvements in function in a reasonable and predictable amount of time.     Precautions / Restrictions Precautions Precautions: Fall Precaution Comments: suplemental O2 baseline Restrictions Weight Bearing Restrictions: No      Mobility  Bed Mobility Overal bed mobility: Needs Assistance Bed Mobility: Supine to Sit, Sit to Supine  Supine to sit: Supervision Sit to supine: Supervision  General bed mobility comments: supv with supine<>sit, pt prefers long sitting position for ease of breathing, cues for pursed lip breathing and to avoid breath holding    Transfers  General transfer comment: pt declines due to increased coughing with mobility, reports recent transfer to [facility] and wanting to rest now    Ambulation/Gait  General Gait Details: pt declines due to increased coughing with mobility  Stairs            Wheelchair Mobility    Modified Rankin (Stroke Patients Only)       Balance Overall balance assessment: Needs assistance Sitting-balance support: Feet supported Sitting balance-Leahy Scale: Good Sitting balance - Comments: seated EOB       Pertinent Vitals/Pain Pain Assessment Pain Assessment: Faces Faces Pain Scale: Hurts little more Pain Location: generalized Pain Descriptors / Indicators:  ("all the time") Pain Intervention(s): Limited activity within patient's tolerance, Monitored during session    Home Living Family/patient expects to be discharged to:: Private residence Living Arrangements: Spouse/significant other Available Help at Discharge: Family;Available PRN/intermittently Type of Home: House Home Access: Stairs to enter Entrance Stairs-Rails: Right;Left  Entrance  Stairs-Number of Steps: 6 (backdoor has 2 steps without handrails)   Home Layout: One level Home Equipment: Rollator (4 wheels);Cane - single point;Wheelchair - manual (oxygen) Additional Comments: pt alone during the day as husband works full time    Prior Function Prior Level of Function : Needs assist  Mobility Comments: pt reports using rollator around the home, w/c for community stances with family pushing her ADLs Comments: pt reports ind with self care tasks needing a lot of extra time and needing rest breaks as needed, spouse completes household chores     Hand Dominance        Extremity/Trunk Assessment   Upper Extremity Assessment Upper Extremity Assessment: Overall WFL for tasks assessed    Lower Extremity Assessment Lower Extremity Assessment: Generalized weakness (AROM WFL, strength grossly 3+/5, reports peripheral neuropathy numbness in bil feet)    Cervical / Trunk Assessment Cervical / Trunk Assessment: Normal  Communication   Communication: No difficulties  Cognition Arousal/Alertness: Awake/alert Behavior During Therapy: WFL for tasks assessed/performed Overall Cognitive Status: Within Functional Limits for tasks assessed     General Comments      Exercises     Assessment/Plan    PT Assessment Patient needs continued PT services  PT Problem List Decreased strength;Decreased activity tolerance;Decreased balance;Cardiopulmonary status limiting activity;Pain;Impaired sensation       PT Treatment Interventions DME instruction;Gait training;Stair training;Functional mobility training;Therapeutic activities;Therapeutic exercise;Balance training;Neuromuscular re-education;Patient/family education    PT Goals (Current goals can be found in the Care Plan section)  Acute Rehab PT Goals Patient Stated Goal: "I don't want to start coughing again" PT Goal Formulation: With patient Time For Goal Achievement: 04/01/22 Potential to Achieve Goals: Good     Frequency Min 3X/week     Co-evaluation               AM-PAC PT "6 Clicks" Mobility  Outcome Measure Help needed turning from your back to your side while in a flat bed without using bedrails?: None Help needed moving from lying on your back to sitting on the side of a flat bed without using bedrails?: None Help needed moving to and from a bed to a chair (including a wheelchair)?: A Little Help needed standing up from a chair using your arms (e.g., wheelchair or bedside chair)?: A Little Help needed to walk in hospital room?: A Little Help needed climbing 3-5 steps with a railing? : A Lot 6 Click Score: 19    End of Session   Activity Tolerance: Patient tolerated treatment well;Patient limited by fatigue Patient left: in bed;with call bell/phone within reach;with family/visitor present Nurse Communication: Mobility status PT Visit Diagnosis: Other abnormalities of gait and mobility (R26.89);Muscle weakness (generalized) (M62.81)    Time: 7841-2820 PT Time Calculation (min) (ACUTE ONLY): 18 min   Charges:   PT Evaluation $PT Eval Low Complexity: 1 Low           Tori Jamare Vanatta PT, DPT 03/18/22, 4:08 PM

## 2022-03-18 NOTE — Progress Notes (Addendum)
[facility]                                                                               [name], is a 67 y.o. female, DOB - [birth year], EFC:[id number] Admit date - 03/15/2022    Outpatient Primary MD for the patient is [name], NP  LOS - 3  days    Brief summary    [name] is a 67 y.o. 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  Her symptoms started about 5 days ago.  She was seen by her oncologist who prescribed doxycycline however her symptoms progressively worsened . patient had been off of chemotherapy for about 2 years until approximately 1 month ago when she started a new oral chemotherapy.  Patient was admitted for further management of influenza A, AKI likely secondary to dehydration.    Assessment & Plan    Assessment and Plan:  Acute on chronic respiratory failure (At home she is between 2.5 to 3 lit [place]) - secondary to influenza A in the setting of stage 4 adenocarcinoma of the lung, COPD exacerbation  -Continue with tamiflu,  Symptomatic management with nebs, cough meds.  -Continue with IV solumedrol 40 mg Q12 hrs. Still has wheezing,  Possible transition to oral steroids in am.   Steroid-induced hyperglycemia/new diagnosis of diabetes with hyperglycemia No prior diagnosis of diabetes Hemoglobin A1c at 6.6 Currently on SSI and carb modified diet, am blood glucose 304, will add long acting insulin 10units qhs  Leukocytosis From stress , steroids? Lactic acid wnl.  WBC count is improving Check blood cultures ( ordered on 1/17)    Anemia of chronic disease:  Hemoglobin baseline around 9, on admission at 8.2 dropped further to 7.6.  NO obvious bleeding seen.  Tbili 0.9 less likely  Hemolysis Does not appear volume overloaded Will check anemia labs Transfuse to keep hemoglobin greater than 7.    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  Admitted with a creatinine of 2.3  , improved to 2 today.  Off IV fluids , encourage oral intake  recheck renal parameters in am.    Hyperkalemia:  Resolved with Lokelma.   Hyponatremia Possibly from dehydration, normalized with hydration    Elevated Liver enzymes:  Unclear etiology,  Check hepatitis panel, CK , abdominal ultrasound [place] can cause hepatotoxicity as well Monitor. Recheck liver panel in am.   Hypertension:  Well controlled on Lopressor  Stage 4 adenocarcinoma  of the lung Continue with adagrasib on discharge. Follow up withDr Arbutus Ped.   FTT Seen by PT who recommended snf , patient is in agreement  Code Status: full code.  DVT Prophylaxis:  enoxaparin (LOVENOX) injection 30 mg Start: 03/16/22 1000 SCDs Start: 03/15/22 2335   Family Communication: none at bedside  Disposition Plan:     . Snf, need to taper steroids   Antimicrobials:   Anti-infectives (From admission, onward)  Start     Dose/Rate Route Frequency Ordered Stop   03/16/22 1000  oseltamivir (TAMIFLU) capsule 30 mg        30 mg Oral Daily 03/15/22 2359 03/20/22 0959   03/15/22 2345  oseltamivir (TAMIFLU) capsule 75 mg  Status:  Discontinued        75 mg Oral 2 times daily 03/15/22 2334 03/15/22 2358        Medications  Scheduled Meds:  adagrasib  400 mg Oral BID   albuterol  2.5 mg Nebulization TID   aspirin  81 mg Oral Daily   buPROPion  150 mg Oral Daily   Chlorhexidine Gluconate Cloth  6 each Topical Daily   enoxaparin (LOVENOX) injection  30 mg Subcutaneous Q24H   fluticasone furoate-vilanterol  1 puff Inhalation Daily   And   umeclidinium bromide  1 puff Inhalation Daily   insulin aspart  0-15 Units Subcutaneous TID WC   insulin aspart  0-5 Units  Subcutaneous QHS   LORazepam  2 mg Oral QPM   methylPREDNISolone (SOLU-MEDROL) injection  40 mg Intravenous Q12H   metoprolol tartrate  12.5 mg Oral BID   oseltamivir  30 mg Oral Daily   sodium chloride flush  10-40 mL Intracatheter Q12H   sodium chloride flush  3 mL Intravenous Q12H   Continuous Infusions:   PRN Meds:.acetaminophen **OR** acetaminophen, albuterol, benzonatate, bisacodyl, hydrALAZINE, HYDROcodone bit-homatropine, nitroGLYCERIN, ondansetron (ZOFRAN) IV, ondansetron **OR** ondansetron (ZOFRAN) IV, senna-docusate, sodium chloride flush    Subjective:   Crescent Gotham was seen and examined today.  Reports her breathing is slowly improving but she is not back to baseline yet No new complaints   Objective:   Vitals:   03/18/22 0318 03/18/22 1145 03/18/22 1149 03/18/22 1357  BP: 114/73   128/62  Pulse: 88   86  Resp:    20  Temp: 98.1 F (36.7 C)   98.1 F (36.7 C)  TempSrc: Oral   Oral  SpO2: 96% 94% 94% 93%  Weight:      Height:        Intake/Output Summary (Last 24 hours) at 03/18/2022 1819 Last data filed at 03/18/2022 1813 Gross per 24 hour  Intake 720 ml  Output 2000 ml  Net -1280 ml   Filed Weights   03/15/22 1831  Weight: 79.4 kg     Exam General exam: Appears calm and comfortable  Respiratory system: bilateral mild Wheezing , prolonged expiratory phase, normal respiratory effort at rest Cardiovascular system: S1 & S2 heard, RRR. No JVD,  Gastrointestinal system: Abdomen is nondistended, soft and nontender.  Central nervous system: Alert and oriented. No focal neurological deficits. Extremities: No edema Skin: No rashes,  Psychiatry:  Mood & affect appropriate.    Data Reviewed:  I have personally reviewed following labs and imaging studies   CBC Lab Results  Component Value Date   WBC 11.1 (H) 03/17/2022   RBC 2.48 (L) 03/17/2022   HGB 7.6 (L) 03/17/2022   HCT 24.2 (L) 03/17/2022   MCV 97.6 03/17/2022   MCH 30.6 03/17/2022   PLT  230 03/17/2022   MCHC 31.4 03/17/2022   RDW 15.1 03/17/2022   LYMPHSABS 0.5 (L) 03/17/2022   MONOABS 0.4 03/17/2022   EOSABS 0.0 03/17/2022   BASOSABS 0.0 16/12/9602     Last metabolic panel Lab Results  Component Value Date   NA 137 03/17/2022   K 4.7 03/17/2022   CL 109 03/17/2022   CO2 20 (L) 03/17/2022   BUN 53 (  H) 03/17/2022   CREATININE 2.00 (H) 03/17/2022   GLUCOSE 304 (H) 03/17/2022   GFRNONAA 27 (L) 03/17/2022   GFRAA 48 (L) 10/27/2019   CALCIUM 8.4 (L) 03/17/2022   PROT 6.1 (L) 03/16/2022   ALBUMIN 3.1 (L) 03/16/2022   LABGLOB 2.5 10/06/2016   AGRATIO 1.8 10/06/2016   BILITOT 0.9 03/16/2022   ALKPHOS 117 03/16/2022   AST 117 (H) 03/16/2022   ALT 119 (H) 03/16/2022   ANIONGAP 8 03/17/2022    CBG (last 3)  Recent Labs    03/18/22 0737 03/18/22 1121 03/18/22 1631  GLUCAP 272* 243* 289*      Coagulation Profile: No results for input(s): "INR", "PROTIME" in the last 168 hours.   Radiology Studies: No results found.     Albertine Grates MD PhD FACP [facility] 03/18/2022, 6:19 PM  Available via Epic secure chat 7am-7pm After 7 pm, please refer to night coverage provider listed on amion.

## 2022-03-19 DIAGNOSIS — J101 Influenza due to other identified influenza virus with other respiratory manifestations: Secondary | ICD-10-CM | POA: Diagnosis not present

## 2022-03-19 LAB — CBC WITH DIFFERENTIAL/PLATELET
Abs Immature Granulocytes: 0.26 10*3/uL — ABNORMAL HIGH (ref 0.00–0.07)
Basophils Absolute: 0 10*3/uL (ref 0.0–0.1)
Basophils Relative: 0 %
Eosinophils Absolute: 0 10*3/uL (ref 0.0–0.5)
Eosinophils Relative: 0 %
HCT: 24.3 % — ABNORMAL LOW (ref 36.0–46.0)
Hemoglobin: 7.5 g/dL — ABNORMAL LOW (ref 12.0–15.0)
Immature Granulocytes: 2 %
Lymphocytes Relative: 5 %
Lymphs Abs: 0.7 10*3/uL (ref 0.7–4.0)
MCH: 30.4 pg (ref 26.0–34.0)
MCHC: 30.9 g/dL (ref 30.0–36.0)
MCV: 98.4 fL (ref 80.0–100.0)
Monocytes Absolute: 0.4 10*3/uL (ref 0.1–1.0)
Monocytes Relative: 3 %
Neutro Abs: 11.4 10*3/uL — ABNORMAL HIGH (ref 1.7–7.7)
Neutrophils Relative %: 90 %
Platelets: 229 10*3/uL (ref 150–400)
RBC: 2.47 MIL/uL — ABNORMAL LOW (ref 3.87–5.11)
RDW: 15.2 % (ref 11.5–15.5)
WBC: 12.8 10*3/uL — ABNORMAL HIGH (ref 4.0–10.5)
nRBC: 0 % (ref 0.0–0.2)

## 2022-03-19 LAB — VITAMIN B12: Vitamin B-12: 231 pg/mL (ref 180–914)

## 2022-03-19 LAB — CK: Total CK: 32 U/L — ABNORMAL LOW (ref 38–234)

## 2022-03-19 LAB — IRON AND TIBC
Iron: 48 ug/dL (ref 28–170)
Saturation Ratios: 18 % (ref 10.4–31.8)
TIBC: 273 ug/dL (ref 250–450)
UIBC: 225 ug/dL

## 2022-03-19 LAB — GLUCOSE, CAPILLARY
Glucose-Capillary: 300 mg/dL — ABNORMAL HIGH (ref 70–99)
Glucose-Capillary: 251 mg/dL — ABNORMAL HIGH (ref 70–99)
Glucose-Capillary: 304 mg/dL — ABNORMAL HIGH (ref 70–99)
Glucose-Capillary: 290 mg/dL — ABNORMAL HIGH (ref 70–99)

## 2022-03-19 LAB — RETICULOCYTES
Immature Retic Fract: 9 % (ref 2.3–15.9)
RBC.: 2.48 MIL/uL — ABNORMAL LOW (ref 3.87–5.11)
Retic Count, Absolute: 99.2 10*3/uL (ref 19.0–186.0)
Retic Ct Pct: 4 % — ABNORMAL HIGH (ref 0.4–3.1)

## 2022-03-19 LAB — COMPREHENSIVE METABOLIC PANEL
ALT: 81 U/L — ABNORMAL HIGH (ref 0–44)
AST: 23 U/L (ref 15–41)
Albumin: 2.8 g/dL — ABNORMAL LOW (ref 3.5–5.0)
Alkaline Phosphatase: 81 U/L (ref 38–126)
Anion gap: 4 — ABNORMAL LOW (ref 5–15)
BUN: 50 mg/dL — ABNORMAL HIGH (ref 8–23)
CO2: 23 mmol/L (ref 22–32)
Calcium: 8.8 mg/dL — ABNORMAL LOW (ref 8.9–10.3)
Chloride: 109 mmol/L (ref 98–111)
Creatinine, Ser: 1.59 mg/dL — ABNORMAL HIGH (ref 0.44–1.00)
GFR, Estimated: 35 mL/min — ABNORMAL LOW (ref >60)
Glucose, Bld: 320 mg/dL — ABNORMAL HIGH (ref 70–99)
Potassium: 4.9 mmol/L (ref 3.5–5.1)
Sodium: 136 mmol/L (ref 135–145)
Total Bilirubin: 0.5 mg/dL (ref 0.3–1.2)
Total Protein: 6 g/dL — ABNORMAL LOW (ref 6.5–8.1)

## 2022-03-19 LAB — HEPATITIS PANEL, ACUTE
HCV Ab: NONREACTIVE
Hep A IgM: NONREACTIVE
Hep B C IgM: NONREACTIVE
Hepatitis B Surface Ag: NONREACTIVE

## 2022-03-19 LAB — FERRITIN: Ferritin: 325 ng/mL — ABNORMAL HIGH (ref 11–307)

## 2022-03-19 MED ORDER — GUAIFENESIN ER 600 MG PO TB12
600.0000 mg | ORAL_TABLET | Freq: Two times a day (BID) | ORAL | Status: DC
  Administered 2022-03-19 – 2022-03-31 (×25): 600 mg via ORAL
  Filled 2022-03-19 (×26): qty 1

## 2022-03-19 MED ORDER — INSULIN ASPART 100 UNIT/ML IJ SOLN
3.0000 [IU] | Freq: Three times a day (TID) | INTRAMUSCULAR | Status: DC
  Administered 2022-03-20 – 2022-03-23 (×10): 3 [IU] via SUBCUTANEOUS

## 2022-03-19 MED ORDER — AZITHROMYCIN 250 MG PO TABS
500.0000 mg | ORAL_TABLET | Freq: Every day | ORAL | Status: AC
  Administered 2022-03-19 – 2022-03-23 (×5): 500 mg via ORAL
  Filled 2022-03-19 (×5): qty 2

## 2022-03-19 MED ORDER — SODIUM CHLORIDE 0.9 % IV SOLN
1.0000 g | INTRAVENOUS | Status: DC
  Administered 2022-03-19 – 2022-03-22 (×4): 1 g via INTRAVENOUS
  Filled 2022-03-19 (×4): qty 10

## 2022-03-19 MED ORDER — POLYETHYLENE GLYCOL 3350 17 G PO PACK
17.0000 g | PACK | Freq: Every day | ORAL | Status: DC
  Administered 2022-03-19: 17 g via ORAL
  Filled 2022-03-19 (×3): qty 1

## 2022-03-19 MED ORDER — MELATONIN 3 MG PO TABS
6.0000 mg | ORAL_TABLET | Freq: Every evening | ORAL | Status: AC | PRN
  Administered 2022-03-19 – 2022-03-21 (×2): 6 mg via ORAL
  Filled 2022-03-19 (×2): qty 2

## 2022-03-19 MED ORDER — BUPROPION HCL ER (XL) 150 MG PO TB24: 150.0000 mg | ORAL_TABLET | Freq: Every day | ORAL | Status: DC

## 2022-03-19 MED ORDER — BUPROPION HCL ER (SR) 150 MG PO TB12
150.0000 mg | ORAL_TABLET | Freq: Every day | ORAL | Status: DC
  Administered 2022-03-19 – 2022-03-31 (×13): 150 mg via ORAL
  Filled 2022-03-19 (×13): qty 1

## 2022-03-19 MED ORDER — SACCHAROMYCES BOULARDII 250 MG PO CAPS
250.0000 mg | ORAL_CAPSULE | Freq: Two times a day (BID) | ORAL | Status: DC
  Administered 2022-03-19 – 2022-03-31 (×24): 250 mg via ORAL
  Filled 2022-03-19 (×24): qty 1

## 2022-03-19 MED ORDER — INSULIN GLARGINE-YFGN 100 UNIT/ML ~~LOC~~ SOLN
10.0000 [IU] | Freq: Two times a day (BID) | SUBCUTANEOUS | Status: DC
  Administered 2022-03-19 – 2022-03-27 (×15): 10 [IU] via SUBCUTANEOUS
  Filled 2022-03-19 (×17): qty 0.1

## 2022-03-19 MED ORDER — VITAMIN B-12 1000 MCG PO TABS
1000.0000 ug | ORAL_TABLET | Freq: Every day | ORAL | Status: DC
  Administered 2022-03-20 – 2022-03-31 (×12): 1000 ug via ORAL
  Filled 2022-03-19 (×12): qty 1

## 2022-03-19 MED ORDER — ENOXAPARIN SODIUM 40 MG/0.4ML IJ SOSY
40.0000 mg | PREFILLED_SYRINGE | INTRAMUSCULAR | Status: DC
  Administered 2022-03-20 – 2022-03-28 (×9): 40 mg via SUBCUTANEOUS
  Filled 2022-03-19 (×9): qty 0.4

## 2022-03-19 MED ORDER — SENNOSIDES-DOCUSATE SODIUM 8.6-50 MG PO TABS
1.0000 | ORAL_TABLET | Freq: Two times a day (BID) | ORAL | Status: DC
  Administered 2022-03-19: 1 via ORAL
  Filled 2022-03-19 (×4): qty 1

## 2022-03-19 NOTE — Progress Notes (Addendum)
[facility]                                                                               [name], is a 67 y.o. female, DOB - Jan 30, 1956, FLY:[id number] Admit date - 03/15/2022    Outpatient Primary MD for the patient is Diana Panda, NP  LOS - 4  days    Brief summary    [name] is a 67 y.o. 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  Her symptoms started about 5 days ago.  She was seen by her oncologist who prescribed doxycycline however her symptoms progressively worsened . patient had been off of chemotherapy for about 2 years until approximately 1 month ago when she started a new oral chemotherapy.  Patient was admitted for further management of influenza A, AKI likely secondary to dehydration.    Assessment & Plan    Assessment and Plan:  Acute on chronic respiratory failure (At home she is between 2.5 to 3 lit [place]) - secondary to influenza A in the setting of stage 4 adenocarcinoma of the lung, COPD exacerbation  -treated with  tamiflu, last dose on 1/18, clinically not improving, continue cough and wheezing, will get ct chest, add on rocephin and zithromax -continue  Symptomatic management with nebs, cough meds.  -Continue with IV solumedrol 40 mg Q12 hrs, hold Krazati as can cause pneumonitis, pneumonia, cough, dyspnea and elevated LFT  Steroid-induced hyperglycemia/new diagnosis of diabetes with hyperglycemia No prior diagnosis of diabetes Hemoglobin A1c at 6.6 Currently on SSI and carb modified diet, am blood glucose 320, increase long acting insulin to 10units bid, add on meal coverage   Leukocytosis From stress , steroids? Lactic acid wnl.  WBC count is improving  blood cultures from 1/17  no  growth     Anemia of chronic disease:  Hemoglobin baseline around 9, on admission at 8.2 dropped further to 7.6.  NO obvious bleeding seen.  Tbili 0.9 less likely Hemolysis Does not appear volume overloaded  anemia labs showed low normal B12 at 231, will start B12 supplement, iron panel not consistent with iron deficiency, Transfuse to keep hemoglobin greater than 7.    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  Admitted with a creatinine of 2.3  , improved to 1.59  Off IV fluids , encourage oral intake  recheck renal parameters in am.    Hyperkalemia:  Resolved with Lokelma.   Hyponatremia Possibly from dehydration, normalized with hydration    Elevated Liver enzymes, appear transient  Unclear etiology,  Negative hepatitis panel, CK abnormally low ,  abdominal ultrasound "1. Hepatic steatosis. 2. Status post cholecystectomy." [name] can cause hepatotoxicity as well Lft has improved , Monitor. Recheck liver panel in am.   Hypertension:  Well controlled on Lopressor  Stage 4 adenocarcinoma  of the lung Continue with adagrasib on discharge. Follow up withDr Arbutus Ped.   FTT Seen by PT  who recommended snf , patient is in agreement  Code Status: full code.  DVT Prophylaxis:  enoxaparin (LOVENOX) injection 30 mg Start: 03/16/22 1000 SCDs Start: 03/15/22 2335   Family Communication: none at bedside  Disposition Plan:     . Snf, once clinically improved, repeat ct chest, need to taper steroids   Antimicrobials:   Anti-infectives (From admission, onward)    Start     Dose/Rate Route Frequency Ordered Stop   03/16/22 1000  oseltamivir (TAMIFLU) capsule 30 mg        30 mg Oral Daily 03/15/22 2359 03/19/22 0849   03/15/22 2345  oseltamivir (TAMIFLU) capsule 75 mg  Status:  Discontinued        75 mg Oral 2 times daily 03/15/22 2334 03/15/22 2358        Medications  Scheduled Meds:  adagrasib  400 mg  Oral BID   aspirin  81 mg Oral Daily   buPROPion  150 mg Oral Daily   Chlorhexidine Gluconate Cloth  6 each Topical Daily   enoxaparin (LOVENOX) injection  30 mg Subcutaneous Q24H   fluticasone furoate-vilanterol  1 puff Inhalation Daily   And   umeclidinium bromide  1 puff Inhalation Daily   guaiFENesin  600 mg Oral BID   insulin aspart  0-15 Units Subcutaneous TID WC   insulin aspart  0-5 Units Subcutaneous QHS   insulin glargine-yfgn  10 Units Subcutaneous QHS   LORazepam  2 mg Oral QPM   methylPREDNISolone (SOLU-MEDROL) injection  40 mg Intravenous Q12H   metoprolol tartrate  12.5 mg Oral BID   sodium chloride flush  10-40 mL Intracatheter Q12H   sodium chloride flush  3 mL Intravenous Q12H   Continuous Infusions:   PRN Meds:.acetaminophen **OR** acetaminophen, albuterol, benzonatate, bisacodyl, hydrALAZINE, HYDROcodone bit-homatropine, nitroGLYCERIN, ondansetron (ZOFRAN) IV, ondansetron **OR** ondansetron (ZOFRAN) IV, senna-docusate, sodium chloride flush    Subjective:   Her lab works has improved, but she reports does not feel better, coughed whole night, she could not sleep, continue to have significant DOE, wheezing No edema, no fever   Objective:   Vitals:   03/18/22 2030 03/19/22 0000 03/19/22 0413 03/19/22 0845  BP: (!) 147/78  125/73 (!) 120/47  Pulse: 84  66 75  Resp: 20 20 20    Temp: 98.2 F (36.8 C)  98.4 F (36.9 C) 98.1 F (36.7 C)  TempSrc:    Oral  SpO2: 96%  97% 95%  Weight:      Height:        Intake/Output Summary (Last 24 hours) at 03/19/2022 1027 Last data filed at 03/18/2022 1813 Gross per 24 hour  Intake 480 ml  Output 1000 ml  Net -520 ml   Filed Weights   03/15/22 1831  Weight: 79.4 kg     Exam General exam: Appears calm and comfortable  Respiratory system: bilateral mild Wheezing , right worse than the left prolonged expiratory phase, normal respiratory effort at rest Cardiovascular system: S1 & S2 heard, RRR. No JVD,   Gastrointestinal system: Abdomen is nondistended, soft and nontender.  Central nervous system: Alert and oriented. No focal neurological deficits. Extremities: No edema Skin: No rashes,  Psychiatry:  Mood & affect appropriate.    Data Reviewed:  I have personally reviewed following labs and imaging studies   CBC Lab Results  Component Value Date   WBC 12.8 (H) 03/19/2022   RBC 2.48 (L) 03/19/2022   RBC 2.47 (L) 03/19/2022   HGB 7.5 (L) 03/19/2022  HCT 24.3 (L) 03/19/2022   MCV 98.4 03/19/2022   MCH 30.4 03/19/2022   PLT 229 03/19/2022   MCHC 30.9 03/19/2022   RDW 15.2 03/19/2022   LYMPHSABS 0.7 03/19/2022   MONOABS 0.4 03/19/2022   EOSABS 0.0 03/19/2022   BASOSABS 0.0 03/19/2022     Last metabolic panel Lab Results  Component Value Date   NA 136 03/19/2022   K 4.9 03/19/2022   CL 109 03/19/2022   CO2 23 03/19/2022   BUN 50 (H) 03/19/2022   CREATININE 1.59 (H) 03/19/2022   GLUCOSE 320 (H) 03/19/2022   GFRNONAA 35 (L) 03/19/2022   GFRAA 48 (L) 10/27/2019   CALCIUM 8.8 (L) 03/19/2022   PROT 6.0 (L) 03/19/2022   ALBUMIN 2.8 (L) 03/19/2022   LABGLOB 2.5 10/06/2016   AGRATIO 1.8 10/06/2016   BILITOT 0.5 03/19/2022   ALKPHOS 81 03/19/2022   AST 23 03/19/2022   ALT 81 (H) 03/19/2022   ANIONGAP 4 (L) 03/19/2022    CBG (last 3)  Recent Labs    03/18/22 1631 03/18/22 2028 03/19/22 0732  GLUCAP 289* 272* 300*      Coagulation Profile: No results for input(s): "INR", "PROTIME" in the last 168 hours.   Radiology Studies: US Abdomen Limited  Result Date: 03/18/2022 CLINICAL DATA:  LFT elevation. EXAM: ULTRASOUND ABDOMEN LIMITED RIGHT UPPER QUADRANT COMPARISON:  05/11/2018. FINDINGS: Gallbladder: Surgically absent. Common bile duct: Diameter: 9 mm, within normal limits for post cholecystectomy status. Liver: No focal lesion identified. Increased parenchymal echogenicity. Portal vein is patent on color Doppler imaging with normal direction of blood flow  towards the liver. Other: No free fluid. IMPRESSION: 1. Hepatic steatosis. 2. Status post cholecystectomy. Electronically Signed   By: Thornell Sartorius M.D.   On: 03/18/2022 20:51       Albertine Grates MD PhD FACP [facility] 03/19/2022, 10:27 AM  Available via Epic secure chat 7am-7pm After 7 pm, please refer to night coverage provider listed on amion.

## 2022-03-19 NOTE — Progress Notes (Signed)
Attempted X2 to get out of bed but declined

## 2022-03-19 NOTE — Progress Notes (Signed)
Patient made aware CT ordered and patient to be taken downstairs. Patient stated she "doesn't feel good" and refused to go down. Would rather do tomorrow per patient. CT and transport made aware.

## 2022-03-19 NOTE — TOC Initial Note (Signed)
Transition of Care [facility]) - Initial/Assessment Note    Patient Details  Name: [name] MRN: [id number] Date of Birth: [birth year]  [facility]) CM/SW Contact:    Otelia Santee, LCSW Phone Number: 03/19/2022, 12:18 PM  Clinical Narrative:                 Met with pt and confirmed plan for SNF placement. Pt shares she has not been to SNF in the past and does not have a preference for facility at this time. Pt currently lives at home with her spouse and has a RW and uses 2-3L of O2 at baseline.  Pt has Medicare part A and [employer] for SNF coverage.  FL-2 has been completed. PASSR requested and currently at level II review.     Expected Discharge Plan: Skilled Nursing Facility Barriers to Discharge: Continued Medical Work up, Awaiting State Approval Cherlyn Roberts), SNF Pending bed offer   Patient Goals and CMS Choice Patient states their goals for this hospitalization and ongoing recovery are:: To go to SNF CMS Medicare.gov Compare Post Acute Care list provided to:: Patient Choice offered to / list presented to : Patient [place] ownership interest in [facility].provided to:: Patient    Expected Discharge Plan and Services In-house Referral: Clinical Social Work Discharge Planning Services: CM Consult Post Acute Care Choice: Skilled Nursing Facility Living arrangements for the past 2 months: Single Family Home                 DME Arranged: N/A DME Agency: NA                  Prior Living Arrangements/Services Living arrangements for the past 2 months: Single Family Home Lives with:: Spouse Patient language and need for interpreter reviewed:: Yes Do you feel safe going back to the place where you live?: Yes      Need for Family Participation in Patient Care: No (Comment) Care giver support system in place?: No (comment) Current home services: DME (RW and O2) Criminal Activity/Legal Involvement Pertinent to Current Situation/Hospitalization: No -  Comment as needed  Activities of Daily Living Home Assistive Devices/Equipment: Environmental consultant (specify type), Wheelchair, Nebulizer, Oxygen, Dentures (specify type), Eyeglasses ADL Screening (condition at time of admission) Patient's cognitive ability adequate to safely complete daily activities?: Yes Is the patient deaf or have difficulty hearing?: No Does the patient have difficulty seeing, even when wearing glasses/contacts?: No Does the patient have difficulty concentrating, remembering, or making decisions?: No Patient able to express need for assistance with ADLs?: No Does the patient have difficulty dressing or bathing?: No Independently performs ADLs?: Yes (appropriate for developmental age) Does the patient have difficulty walking or climbing stairs?: No Weakness of Legs: None Weakness of Arms/Hands: None  Permission Sought/Granted Permission sought to share information with : Facility Medical sales representative, Family Supports Permission granted to share information with : Yes, Verbal Permission Granted  Share Information with NAME: [name]  Permission granted to share info w AGENCY: SNF's  Permission granted to share info w Relationship: Spouse  Permission granted to share info w Contact Information: [phone]  Emotional Assessment Appearance:: Appears stated age Attitude/Demeanor/Rapport: Engaged Affect (typically observed): Accepting Orientation: : Oriented to Self, Oriented to Place, Oriented to  Time, Oriented to Situation Alcohol / Substance Use: Not Applicable Psych Involvement: No (comment)  Admission diagnosis:  Flu [J11.1] SOB (shortness of breath) [R06.02] Influenza A [J10.1] Patient Active Problem List   Diagnosis Date Noted   Influenza  A 03/15/2022   COPD (chronic obstructive pulmonary disease) (HCC) 01/27/2021   Chronic respiratory failure with hypoxia (HCC) 01/27/2021   Anemia associated with chemotherapy 10/07/2018   Thrombocytopenia (HCC) 10/07/2018    Acute pyelonephritis 10/06/2018   Acute lower UTI    Diarrhea 06/20/2018   Tardive dyskinesia 06/20/2018   Medication side effect 06/20/2018   Nausea and vomiting 05/11/2018   AKI (acute kidney injury) (HCC) 05/11/2018   Port-A-Cath in place 05/02/2018   Adenocarcinoma of left lung, stage 4 (HCC) 04/05/2018   Encounter for antineoplastic chemotherapy 04/05/2018   Encounter for antineoplastic immunotherapy 04/05/2018   Goals of care, counseling/discussion 04/05/2018   Tobacco abuse counseling 03/05/2018   PVC's (premature ventricular contractions)    SVT (supraventricular tachycardia)    Premature atrial contractions    Unstable angina (HCC) 12/07/2012   Tachycardia 02/19/2009   CAD, NATIVE VESSEL 06/04/2008   Hyperlipemia 05/29/2008   DEPRESSION 05/29/2008   Asthma 05/29/2008   GERD 05/29/2008   Irritable bowel syndrome 05/29/2008   Spondylosis 05/29/2008   FIBROMYALGIA 05/29/2008   CHEST PAIN-PRECORDIAL 05/07/2008   PCP:  [name], NP Pharmacy:   [id number] [place], [place] - [address] AT [facility] OF [address] [place] [address] Phone: [phone] Fax: [phone]  [facility] [facility] [address] - [place], [place] - [address] [place] [address] Phone: [phone] Fax: [phone]  [place] [facility] [address] [place] [address] Phone: [phone] Fax: [phone]  [facility][id number] [place], [place] - [address] AT [facility] OF [facility] [address] [facility] [address] [place] [address] Phone: [phone] Fax: [phone]     Social Determinants of Health (SDOH) Social History: SDOH Screenings   Food Insecurity: No Food Insecurity (03/16/2022)  Housing: Low Risk  (03/16/2022)  Transportation Needs: No Transportation Needs (03/16/2022)  Utilities: Not At Risk (03/16/2022)  Tobacco Use: Medium Risk  (03/15/2022)   SDOH Interventions:     Readmission Risk Interventions    03/19/2022   12:16 PM 03/19/2022   11:41 AM  Readmission Risk Prevention Plan  Transportation Screening Complete Complete  PCP or Specialist Appt within 5-7 Days  Complete  Home Care Screening  Complete  Medication Review (RN CM)  Complete

## 2022-03-19 NOTE — [place] FL2 (Signed)
[place] MEDICAID FL2 LEVEL OF CARE FORM     IDENTIFICATION  Patient Name: [name] Birthdate: 1955-10-21 Sex: female Admission Date (Current Location): 03/15/2022  [facility] and [place] Number:  Producer, television/film/video and Address:  [facility],  [address] [place], [place] [address]      Provider Number: [id number]  Attending Physician Name and Address:  Albertine Grates, MD  Relative Name and Phone Number:  D[place], [place] [phone]    Current Level of Care: Hospital Recommended Level of Care: Skilled Nursing Facility Prior Approval Number:    Date Approved/Denied:   PASRR Number: Pending  Discharge Plan: SNF    Current Diagnoses: Patient Active Problem List   Diagnosis Date Noted   Influenza A 03/15/2022   COPD (chronic obstructive pulmonary disease) (HCC) 01/27/2021   Chronic respiratory failure with hypoxia (HCC) 01/27/2021   Anemia associated with chemotherapy 10/07/2018   Thrombocytopenia (HCC) 10/07/2018   Acute pyelonephritis 10/06/2018   Acute lower UTI    Diarrhea 06/20/2018   Tardive dyskinesia 06/20/2018   Medication side effect 06/20/2018   Nausea and vomiting 05/11/2018   AKI (acute kidney injury) (HCC) 05/11/2018   Port-A-Cath in place 05/02/2018   Adenocarcinoma of left lung, stage 4 (HCC) 04/05/2018   Encounter for antineoplastic chemotherapy 04/05/2018   Encounter for antineoplastic immunotherapy 04/05/2018   Goals of care, counseling/discussion 04/05/2018   Tobacco abuse counseling 03/05/2018   PVC's (premature ventricular contractions)    SVT (supraventricular tachycardia)    Premature atrial contractions    Unstable angina (HCC) 12/07/2012   Tachycardia 02/19/2009   CAD, NATIVE VESSEL 06/04/2008   Hyperlipemia 05/29/2008   DEPRESSION 05/29/2008   Asthma 05/29/2008   GERD 05/29/2008   Irritable bowel syndrome 05/29/2008   Spondylosis 05/29/2008   FIBROMYALGIA 05/29/2008   CHEST PAIN-PRECORDIAL 05/07/2008    Orientation  RESPIRATION BLADDER Height & Weight     Self, Situation, Place, Time  O2 Continent Weight: 175 lb (79.4 kg) Height:  5\' 7"  (170.2 cm)  BEHAVIORAL SYMPTOMS/MOOD NEUROLOGICAL BOWEL NUTRITION STATUS      Continent Diet (Regular)  AMBULATORY STATUS COMMUNICATION OF NEEDS Skin   Limited Assist Verbally Normal                       Personal Care Assistance Level of Assistance  Bathing, Feeding, Dressing Bathing Assistance: Limited assistance Feeding assistance: Independent Dressing Assistance: Limited assistance     Functional Limitations Info  Sight, Hearing, Speech Sight Info: Adequate Hearing Info: Adequate Speech Info: Adequate    SPECIAL CARE FACTORS FREQUENCY  PT (By licensed PT), OT (By licensed OT)     PT Frequency: 5x/wk OT Frequency: 5x/wk            Contractures Contractures Info: Not present    Additional Factors Info  Code Status, Allergies Code Status Info: FULL Allergies Info: Ciprofloxacin, Levofloxacin, Statins, Tricor (Fenofibrate), Compazine (Prochlorperazine), Latex, Codeine, Metoprolol           Current Medications (03/19/2022):  This is the current hospital active medication list Current Facility-Administered Medications  Medication Dose Route Frequency Provider Last Rate Last Admin   acetaminophen (TYLENOL) tablet 650 mg  650 mg Oral Q6H PRN Hugelmeyer, Alexis, DO   650 mg at 03/17/22 1642   Or   acetaminophen (TYLENOL) suppository 650 mg  650 mg Rectal Q6H PRN Hugelmeyer, Alexis, DO       adagrasib (KRAZATI) tablet 400 mg  400 mg Oral BID Hugelmeyer, Alexis,  DO       albuterol (PROVENTIL) (2.5 MG/3ML) 0.083% nebulizer solution 2.5 mg  2.5 mg Nebulization Q6H PRN Hugelmeyer, Alexis, DO       aspirin chewable tablet 81 mg  81 mg Oral Daily Hugelmeyer, Alexis, DO   81 mg at 03/19/22 0846   benzonatate (TESSALON) capsule 200 mg  200 mg Oral TID PRN Kathlen Mody, MD   200 mg at 03/19/22 0847   bisacodyl (DULCOLAX) EC tablet 5 mg  5 mg Oral  Daily PRN Hugelmeyer, Alexis, DO       buPROPion (WELLBUTRIN SR) 12 hr tablet 150 mg  150 mg Oral Daily Danford Bad, RPH   150 mg at 03/19/22 0847   Chlorhexidine Gluconate Cloth 2 % PADS 6 each  6 each Topical Daily Kathlen Mody, MD   6 each at 03/19/22 1059   enoxaparin (LOVENOX) injection 30 mg  30 mg Subcutaneous Q24H Hugelmeyer, Alexis, DO   30 mg at 03/19/22 0846   fluticasone furoate-vilanterol (BREO ELLIPTA) 100-25 MCG/ACT 1 puff  1 puff Inhalation Daily Shade, Christine E, RPH   1 puff at 03/19/22 0824   And   umeclidinium bromide (INCRUSE ELLIPTA) 62.5 MCG/ACT 1 puff  1 puff Inhalation Daily Shade, Jacqulyn Cane, RPH   1 puff at 03/19/22 0824   guaiFENesin (MUCINEX) 12 hr tablet 600 mg  600 mg Oral BID Albertine Grates, MD   600 mg at 03/19/22 1058   hydrALAZINE (APRESOLINE) injection 10 mg  10 mg Intravenous Q6H PRN Hugelmeyer, Alexis, DO       HYDROcodone bit-homatropine (HYCODAN) 5-1.5 MG/5ML syrup 5 mL  5 mL Oral Q4H PRN Kathlen Mody, MD   5 mL at 03/19/22 1058   insulin aspart (novoLOG) injection 0-15 Units  0-15 Units Subcutaneous TID WC Kathlen Mody, MD   8 Units at 03/19/22 0825   insulin aspart (novoLOG) injection 0-5 Units  0-5 Units Subcutaneous QHS Kathlen Mody, MD   3 Units at 03/18/22 2151   insulin glargine-yfgn (SEMGLEE) injection 10 Units  10 Units Subcutaneous QHS Albertine Grates, MD   10 Units at 03/18/22 2151   LORazepam (ATIVAN) tablet 2 mg  2 mg Oral QPM Hugelmeyer, Alexis, DO   2 mg at 03/18/22 1706   methylPREDNISolone sodium succinate (SOLU-MEDROL) 40 mg/mL injection 40 mg  40 mg Intravenous Q12H Hugelmeyer, Alexis, DO   40 mg at 03/19/22 1059   metoprolol tartrate (LOPRESSOR) tablet 12.5 mg  12.5 mg Oral BID Hugelmeyer, Alexis, DO   12.5 mg at 03/19/22 0846   nitroGLYCERIN (NITROSTAT) SL tablet 0.4 mg  0.4 mg Sublingual Q5 min PRN Hugelmeyer, Alexis, DO   0.4 mg at 03/17/22 0317   ondansetron (ZOFRAN) injection 4 mg  4 mg Intravenous Q6H PRN Prosperi, Christian H, PA-C   4  mg at 03/15/22 1919   ondansetron (ZOFRAN) tablet 4 mg  4 mg Oral Q6H PRN Hugelmeyer, Alexis, DO       Or   ondansetron (ZOFRAN) injection 4 mg  4 mg Intravenous Q6H PRN Hugelmeyer, Alexis, DO   4 mg at 03/18/22 2351   senna-docusate (Senokot-S) tablet 1 tablet  1 tablet Oral QHS PRN Hugelmeyer, Alexis, DO       sodium chloride flush (NS) 0.9 % injection 10-40 mL  10-40 mL Intracatheter Q12H Kathlen Mody, MD   10 mL at 03/19/22 1100   sodium chloride flush (NS) 0.9 % injection 10-40 mL  10-40 mL Intracatheter PRN Kathlen Mody, MD  sodium chloride flush (NS) 0.9 % injection 3 mL  3 mL Intravenous Q12H Hugelmeyer, Alexis, DO   3 mL at 03/18/22 6898     Discharge Medications: Please see discharge summary for a list of discharge medications.  Relevant Imaging Results:  Relevant Lab Results:   Additional Information SSN: [id number]  Otelia Santee, LCSW

## 2022-03-20 ENCOUNTER — Inpatient Hospital Stay (HOSPITAL_COMMUNITY): Payer: Managed Care, Other (non HMO)

## 2022-03-20 DIAGNOSIS — J101 Influenza due to other identified influenza virus with other respiratory manifestations: Secondary | ICD-10-CM | POA: Diagnosis not present

## 2022-03-20 LAB — BASIC METABOLIC PANEL
Anion gap: 7 (ref 5–15)
BUN: 59 mg/dL — ABNORMAL HIGH (ref 8–23)
CO2: 23 mmol/L (ref 22–32)
Calcium: 8.6 mg/dL — ABNORMAL LOW (ref 8.9–10.3)
Chloride: 108 mmol/L (ref 98–111)
Creatinine, Ser: 1.53 mg/dL — ABNORMAL HIGH (ref 0.44–1.00)
GFR, Estimated: 37 mL/min — ABNORMAL LOW (ref >60)
Glucose, Bld: 254 mg/dL — ABNORMAL HIGH (ref 70–99)
Potassium: 4.7 mmol/L (ref 3.5–5.1)
Sodium: 138 mmol/L (ref 135–145)

## 2022-03-20 LAB — GLUCOSE, CAPILLARY
Glucose-Capillary: 278 mg/dL — ABNORMAL HIGH (ref 70–99)
Glucose-Capillary: 269 mg/dL — ABNORMAL HIGH (ref 70–99)
Glucose-Capillary: 157 mg/dL — ABNORMAL HIGH (ref 70–99)
Glucose-Capillary: 211 mg/dL — ABNORMAL HIGH (ref 70–99)

## 2022-03-20 LAB — STREP PNEUMONIAE URINARY ANTIGEN: Strep Pneumo Urinary Antigen: POSITIVE — AB

## 2022-03-20 NOTE — Progress Notes (Signed)
[facility]                                                                               [name], is a 67 y.o. female, DOB - 1955/09/21, UYQ:[id number] Admit date - 03/15/2022    Outpatient Primary MD for the patient is Diana Panda, NP  LOS - 5  days    Brief summary    [name] is a 67 y.o. 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  Her symptoms started about 5 days ago.  She was seen by her oncologist who prescribed doxycycline however her symptoms progressively worsened . patient had been off of chemotherapy for about 2 years until approximately 1 month ago when she started a new oral chemotherapy.  Patient was admitted for further management of influenza A, AKI likely secondary to dehydration.    Assessment & Plan    Assessment and Plan:  Acute on chronic respiratory failure (At home she is between 2.5 to 3 lit [place]) - secondary to influenza A in the setting of stage 4 adenocarcinoma of the lung, COPD exacerbation  -treated with  tamiflu, last dose on 1/18, clinically not improving, continue cough and wheezing,  started on rocephin and zithromax on 1/19, awaiting for CT chest to be done -continue  Symptomatic management with nebs, cough meds.  -Continue with IV solumedrol 40 mg Q12 hrs, hold Krazati as can cause pneumonitis, pneumonia, cough, dyspnea and elevated LFT   Steroid-induced hyperglycemia/new diagnosis of diabetes with hyperglycemia No prior diagnosis of diabetes Hemoglobin A1c at 6.6 Currently on SSI and carb modified diet, am blood glucose 320, increase long acting insulin to 10units bid, add on meal coverage   Leukocytosis From stress , steroids? Lactic acid wnl.  WBC count is improving   blood cultures from 1/17  no growth     Anemia of chronic disease:  Hemoglobin baseline around 9, on admission at 8.2 dropped further to 7.6.  NO obvious bleeding seen.  Tbili 0.9 less likely Hemolysis Does not appear volume overloaded  anemia labs showed low normal B12 at 231, will start B12 supplement, iron panel not consistent with iron deficiency, Transfuse to keep hemoglobin greater than 7.    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  Admitted with a creatinine of 2.3  , improved to 1.59  Off IV fluids , encourage oral intake  recheck renal parameters in am.    Hyperkalemia:  Resolved with Lokelma.   Hyponatremia Possibly from dehydration, normalized with hydration    Elevated Liver enzymes, appear transient  Unclear etiology,  Negative hepatitis panel, CK abnormally low ,  abdominal ultrasound "1. Hepatic steatosis. 2. Status post cholecystectomy." [name] can cause hepatotoxicity as well Lft has improved , Monitor. Recheck liver panel in am.   Hypertension:  Well controlled on Lopressor  Stage 4 adenocarcinoma  of the lung Continue with adagrasib on discharge. Follow up withDr  Mohamed.   FTT Seen by PT who recommended snf , patient is in agreement  Code Status: full code.  DVT Prophylaxis:  enoxaparin (LOVENOX) injection 40 mg Start: 03/20/22 1000 SCDs Start: 03/15/22 2335   Family Communication: none at bedside  Disposition Plan:     . Snf, once clinically improved, repeat ct chest, need to taper steroids   Antimicrobials:   Anti-infectives (From admission, onward)    Start     Dose/Rate Route Frequency Ordered Stop   03/19/22 1815  azithromycin (ZITHROMAX) tablet 500 mg        500 mg Oral Daily 03/19/22 1718 03/24/22 0959   03/19/22 1800  cefTRIAXone (ROCEPHIN) 1 g in sodium chloride 0.9 % 100 mL IVPB        1 g 200 mL/hr over 30 Minutes Intravenous Every 24 hours 03/19/22 1718      03/16/22 1000  oseltamivir (TAMIFLU) capsule 30 mg        30 mg Oral Daily 03/15/22 2359 03/19/22 0849   03/15/22 2345  oseltamivir (TAMIFLU) capsule 75 mg  Status:  Discontinued        75 mg Oral 2 times daily 03/15/22 2334 03/15/22 2358        Medications  Scheduled Meds:  aspirin  81 mg Oral Daily   azithromycin  500 mg Oral Daily   buPROPion  150 mg Oral Daily   Chlorhexidine Gluconate Cloth  6 each Topical Daily   vitamin B-12  1,000 mcg Oral Daily   enoxaparin (LOVENOX) injection  40 mg Subcutaneous Q24H   fluticasone furoate-vilanterol  1 puff Inhalation Daily   And   umeclidinium bromide  1 puff Inhalation Daily   guaiFENesin  600 mg Oral BID   insulin aspart  0-15 Units Subcutaneous TID WC   insulin aspart  0-5 Units Subcutaneous QHS   insulin aspart  3 Units Subcutaneous TID WC   insulin glargine-yfgn  10 Units Subcutaneous BID   LORazepam  2 mg Oral QPM   methylPREDNISolone (SOLU-MEDROL) injection  40 mg Intravenous Q12H   metoprolol tartrate  12.5 mg Oral BID   polyethylene glycol  17 g Oral Daily   saccharomyces boulardii  250 mg Oral BID   senna-docusate  1 tablet Oral BID   sodium chloride flush  10-40 mL Intracatheter Q12H   sodium chloride flush  3 mL Intravenous Q12H   Continuous Infusions:  cefTRIAXone (ROCEPHIN)  IV 1 g (03/20/22 1745)    PRN Meds:.acetaminophen **OR** acetaminophen, albuterol, benzonatate, bisacodyl, hydrALAZINE, HYDROcodone bit-homatropine, melatonin, nitroGLYCERIN, ondansetron (ZOFRAN) IV, ondansetron **OR** ondansetron (ZOFRAN) IV, senna-docusate, sodium chloride flush    Subjective:   Her lab works continue to improve, she reports feeling a little bit better today, she reports cough is more productive and less wheezing  No edema, no fever   Objective:   Vitals:   03/19/22 2313 03/20/22 0413 03/20/22 0828 03/20/22 1336  BP: 134/73 119/60  128/72  Pulse: 67 68  65  Resp: 15 17  18   Temp: 97.7 F (36.5 C) 98.3 F (36.8  C)  97.9 F (36.6 C)  TempSrc: Oral Oral    SpO2: 100% 90% 92% 100%  Weight:      Height:        Intake/Output Summary (Last 24 hours) at 03/20/2022 1911 Last data filed at 03/20/2022 1357 Gross per 24 hour  Intake 240 ml  Output --  Net 240 ml   Filed Weights   03/15/22 1831  Weight: 79.4 kg  Exam General exam: Appears calm and comfortable  Respiratory system: bilateral mild Wheezing , right worse than the left prolonged expiratory phase, normal respiratory effort at rest Cardiovascular system: S1 & S2 heard, RRR. No JVD,  Gastrointestinal system: Abdomen is nondistended, soft and nontender.  Central nervous system: Alert and oriented. No focal neurological deficits. Extremities: No edema Skin: No rashes,  Psychiatry:  Mood & affect appropriate.    Data Reviewed:  I have personally reviewed following labs and imaging studies   CBC Lab Results  Component Value Date   WBC 12.8 (H) 03/19/2022   RBC 2.48 (L) 03/19/2022   RBC 2.47 (L) 03/19/2022   HGB 7.5 (L) 03/19/2022   HCT 24.3 (L) 03/19/2022   MCV 98.4 03/19/2022   MCH 30.4 03/19/2022   PLT 229 03/19/2022   MCHC 30.9 03/19/2022   RDW 15.2 03/19/2022   LYMPHSABS 0.7 03/19/2022   MONOABS 0.4 03/19/2022   EOSABS 0.0 03/19/2022   BASOSABS 0.0 03/19/2022     Last metabolic panel Lab Results  Component Value Date   NA 138 03/20/2022   K 4.7 03/20/2022   CL 108 03/20/2022   CO2 23 03/20/2022   BUN 59 (H) 03/20/2022   CREATININE 1.53 (H) 03/20/2022   GLUCOSE 254 (H) 03/20/2022   GFRNONAA 37 (L) 03/20/2022   GFRAA 48 (L) 10/27/2019   CALCIUM 8.6 (L) 03/20/2022   PROT 6.0 (L) 03/19/2022   ALBUMIN 2.8 (L) 03/19/2022   LABGLOB 2.5 10/06/2016   AGRATIO 1.8 10/06/2016   BILITOT 0.5 03/19/2022   ALKPHOS 81 03/19/2022   AST 23 03/19/2022   ALT 81 (H) 03/19/2022   ANIONGAP 7 03/20/2022    CBG (last 3)  Recent Labs    03/20/22 0814 03/20/22 1138 03/20/22 1641  GLUCAP 278* 269* 157*       Coagulation Profile: No results for input(s): "INR", "PROTIME" in the last 168 hours.   Radiology Studies: CT CHEST WO CONTRAST  Result Date: 03/20/2022 CLINICAL DATA:  COPD exacerbation, persistent cough despite treatment EXAM: CT CHEST WITHOUT CONTRAST TECHNIQUE: Multidetector CT imaging of the chest was performed following the standard protocol without IV contrast. RADIATION DOSE REDUCTION: This exam was performed according to the departmental dose-optimization program which includes automated exposure control, adjustment of the mA and/or kV according to patient size and/or use of iterative reconstruction technique. COMPARISON:  01/15/2022 FINDINGS: Cardiovascular: Atherosclerotic calcifications aorta and coronary arteries. Heart size normal. No pericardial effusion. Aorta normal caliber. RIGHT jugular Port-A-Cath with tip in SVC. Mediastinum/Nodes: Esophagus normal appearance. Base of cervical region unremarkable. No thoracic adenopathy. Lungs/Pleura: Centrilobular emphysema greatest in upper lobes. Central peribronchial thickening. Subsolid opacity RIGHT upper lobe 17 x 10 mm image 48 unchanged. Additional small focus of subsolid opacity in the superior segment LEFT lower lobe abutting major fissure, 13 x 10 mm, stable. Biapical scarring. Minimal peribronchovascular infiltrate RIGHT middle lobe and lingula, slightly increased. Calcified granuloma LEFT lower lobe image 115. Additional areas of minimal ground-glass opacity in th both upper lobes unchanged. RIGHT lower lobe nodule identified on previous exam no longer identified. 3 mm nodule posterior RIGHT upper lobe image 37 not definitely seen previously. No additional mass, nodule, infiltrate, or effusion. Upper Abdomen: Post cholecystectomy. Remaining visualized upper abdomen unremarkable Musculoskeletal: Osseous demineralization. Chronic compression fracture of T5 vertebral body with 40% anterior height loss again seen. IMPRESSION: Emphysematous  and bronchitic changes with scattered chronic opacities in the upper lobes, stable. Minimal peribronchovascular infiltrate RIGHT middle lobe and lingula, slightly increased question related  to bronchiolitis or atypical infection. Stable subsolid opacities in RIGHT upper lobe and superior segment LEFT lower lobe. 3 mm posterior RIGHT upper lobe nodule not definitely seen previously. Scattered atherosclerotic calcifications including coronary arteries. No acute intrathoracic abnormalities. Aortic Atherosclerosis (ICD10-I70.0) and Emphysema (ICD10-J43.9). Electronically Signed   By: Ulyses Southward M.D.   On: 03/20/2022 17:59   US Abdomen Limited  Result Date: 03/18/2022 CLINICAL DATA:  LFT elevation. EXAM: ULTRASOUND ABDOMEN LIMITED RIGHT UPPER QUADRANT COMPARISON:  05/11/2018. FINDINGS: Gallbladder: Surgically absent. Common bile duct: Diameter: 9 mm, within normal limits for post cholecystectomy status. Liver: No focal lesion identified. Increased parenchymal echogenicity. Portal vein is patent on color Doppler imaging with normal direction of blood flow towards the liver. Other: No free fluid. IMPRESSION: 1. Hepatic steatosis. 2. Status post cholecystectomy. Electronically Signed   By: Thornell Sartorius M.D.   On: 03/18/2022 20:51       Albertine Grates MD PhD FACP [facility] 03/20/2022, 7:11 PM  Available via Epic secure chat 7am-7pm After 7 pm, please refer to night coverage provider listed on amion.

## 2022-03-21 DIAGNOSIS — J101 Influenza due to other identified influenza virus with other respiratory manifestations: Secondary | ICD-10-CM | POA: Diagnosis not present

## 2022-03-21 LAB — URINALYSIS, ROUTINE W REFLEX MICROSCOPIC
Bacteria, UA: NONE SEEN
Bilirubin Urine: NEGATIVE
Glucose, UA: NEGATIVE mg/dL
Ketones, ur: NEGATIVE mg/dL
Leukocytes,Ua: NEGATIVE
Nitrite: NEGATIVE
Protein, ur: 30 mg/dL — AB
Specific Gravity, Urine: 1.021 (ref 1.005–1.030)
pH: 6 (ref 5.0–8.0)

## 2022-03-21 LAB — BASIC METABOLIC PANEL
Anion gap: 5 (ref 5–15)
BUN: 60 mg/dL — ABNORMAL HIGH (ref 8–23)
CO2: 26 mmol/L (ref 22–32)
Calcium: 8.5 mg/dL — ABNORMAL LOW (ref 8.9–10.3)
Chloride: 107 mmol/L (ref 98–111)
Creatinine, Ser: 1.49 mg/dL — ABNORMAL HIGH (ref 0.44–1.00)
GFR, Estimated: 38 mL/min — ABNORMAL LOW (ref >60)
Glucose, Bld: 251 mg/dL — ABNORMAL HIGH (ref 70–99)
Potassium: 4.5 mmol/L (ref 3.5–5.1)
Sodium: 138 mmol/L (ref 135–145)

## 2022-03-21 LAB — GLUCOSE, CAPILLARY
Glucose-Capillary: 332 mg/dL — ABNORMAL HIGH (ref 70–99)
Glucose-Capillary: 251 mg/dL — ABNORMAL HIGH (ref 70–99)
Glucose-Capillary: 243 mg/dL — ABNORMAL HIGH (ref 70–99)
Glucose-Capillary: 177 mg/dL — ABNORMAL HIGH (ref 70–99)
Glucose-Capillary: 297 mg/dL — ABNORMAL HIGH (ref 70–99)

## 2022-03-21 LAB — OCCULT BLOOD X 1 CARD TO LAB, STOOL: Fecal Occult Bld: POSITIVE — AB

## 2022-03-21 LAB — PHOSPHORUS: Phosphorus: 4.4 mg/dL (ref 2.5–4.6)

## 2022-03-21 LAB — MAGNESIUM: Magnesium: 2.4 mg/dL (ref 1.7–2.4)

## 2022-03-21 MED ORDER — METOCLOPRAMIDE HCL 5 MG/ML IJ SOLN
5.0000 mg | Freq: Four times a day (QID) | INTRAMUSCULAR | Status: DC | PRN
  Administered 2022-03-21 – 2022-03-23 (×6): 5 mg via INTRAVENOUS
  Filled 2022-03-21 (×8): qty 1

## 2022-03-21 MED ORDER — PREDNISONE 20 MG PO TABS
50.0000 mg | ORAL_TABLET | Freq: Every day | ORAL | Status: DC
  Administered 2022-03-22: 50 mg via ORAL
  Filled 2022-03-21: qty 1

## 2022-03-21 MED ORDER — LIP MEDEX EX OINT
TOPICAL_OINTMENT | CUTANEOUS | Status: DC | PRN
  Administered 2022-03-21: 75 via TOPICAL

## 2022-03-21 MED ORDER — PHENOL 1.4 % MT LIQD
1.0000 | OROMUCOSAL | Status: DC | PRN
  Administered 2022-03-21: 1 via OROMUCOSAL
  Filled 2022-03-21 (×3): qty 177

## 2022-03-21 MED ORDER — FAMOTIDINE 20 MG PO TABS
10.0000 mg | ORAL_TABLET | Freq: Every day | ORAL | Status: DC
  Administered 2022-03-21 – 2022-03-30 (×10): 10 mg via ORAL
  Filled 2022-03-21 (×10): qty 1

## 2022-03-21 MED ORDER — INSULIN ASPART 100 UNIT/ML IJ SOLN
4.0000 [IU] | Freq: Once | INTRAMUSCULAR | Status: AC
  Administered 2022-03-21: 4 [IU] via SUBCUTANEOUS

## 2022-03-21 MED ORDER — CALCIUM CARBONATE ANTACID 500 MG PO CHEW
1.0000 | CHEWABLE_TABLET | Freq: Every day | ORAL | Status: AC
  Administered 2022-03-21: 200 mg via ORAL
  Filled 2022-03-21 (×3): qty 1

## 2022-03-21 NOTE — TOC Progression Note (Signed)
Transition of Care [facility]) - Progression Note   Patient Details  Name: [name] MRN: [id number] Date of Birth: 08/11/1955  Transition of Care [facility]) CM/SW Contact  Ewing Schlein, LCSW Phone Number: 03/21/2022, 11:53 AM  Clinical Narrative: Clinicals uploaded to [facility] MUST for review. Initial referral faxed out. TOC awaiting bed offers and PASRR number.  Expected Discharge Plan: Skilled Nursing Facility Barriers to Discharge: Continued Medical Work up, Awaiting State Approval [facility] Roberts), SNF Pending bed offer  Expected Discharge Plan and Services In-house Referral: Clinical Social Work Discharge Planning Services: CM Consult Post Acute Care Choice: Skilled Nursing Facility Living arrangements for the past 2 months: Single Family Home           DME Arranged: N/A DME Agency: NA  Social Determinants of Health (SDOH) Interventions SDOH Screenings   Food Insecurity: No Food Insecurity (03/16/2022)  Housing: Low Risk  (03/16/2022)  Transportation Needs: No Transportation Needs (03/16/2022)  Utilities: Not At Risk (03/16/2022)  Tobacco Use: Medium Risk (03/15/2022)   Readmission Risk Interventions    03/19/2022   12:16 PM 03/19/2022   11:41 AM  Readmission Risk Prevention Plan  Transportation Screening Complete Complete  PCP or Specialist Appt within 5-7 Days  Complete  Home Care Screening  Complete  Medication Review (RN CM)  Complete

## 2022-03-21 NOTE — Progress Notes (Signed)
Mobility Specialist Cancellation/Refusal Note:   Pt declined mobility due to feeling nausea. Will check back as schedule permits.  [facility]

## 2022-03-21 NOTE — Progress Notes (Signed)
[facility]                                                                               [name], is a 67 y.o. female, DOB - September 03, 1955, YPO:[id number] Admit date - 03/15/2022    Outpatient Primary MD for the patient is [name], NP  LOS - 6  days    Brief summary    [name] is a 67 y.o. 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  Her symptoms started about 5 days ago.  She was seen by her oncologist who prescribed doxycycline however her symptoms progressively worsened . patient had been off of chemotherapy for about 2 years until approximately 1 month ago when she started a new oral chemotherapy.  Patient was admitted for further management of influenza A, AKI likely secondary to dehydration.    Assessment & Plan    Assessment and Plan:  Acute on chronic respiratory failure (At home she is between 2.5 to 3 lit [place]) - secondary to influenza A in the setting of stage 4 adenocarcinoma of the lung, COPD exacerbation  -Finished treatment with  tamiflu,  - started on rocephin and zithromax on 1/19 due to lack of clinical improvement.  -CT chest obtained on 1/19 showed "Emphysematous and bronchitic changes with scattered chronic opacities in the upper lobes, stable.   Minimal peribronchovascular infiltrate RIGHT middle lobe and lingula, slightly increased question related to bronchiolitis or atypical infection.   Stable subsolid opacities in RIGHT upper lobe and superior segment LEFT lower lobe.   3 mm posterior RIGHT upper lobe nodule not definitely seen previously.   Scattered atherosclerotic calcifications including coronary arteries.   No acute intrathoracic abnormalities.   Aortic  Atherosclerosis (ICD10-I70.0) and Emphysema (ICD10-J43.9)."  -appear has improved on 1/20, no significant wheezing, much improved aeration -continue abx, taper steroids - hold Krazati as can cause pneumonitis, pneumonia, cough, dyspnea and elevated LFT   Steroid-induced hyperglycemia/new diagnosis of diabetes with hyperglycemia No prior diagnosis of diabetes Hemoglobin A1c at 6.6 Currently on SSI and carb modified diet, am blood glucose 251,  -currently on  long acting insulin to 10units bid, add on meal coverage  and ssi -Taper steroid, continue adjust insulin as needed  Leukocytosis From stress , steroids? Lactic acid wnl.  WBC count is improving  blood cultures from 1/17  no growth  Taper steroid  Hyperkalemia:  Resolved with Lokelma.   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  Admitted with a creatinine of 2.3  , improved to 1.49 Off IV fluids , encourage oral intake  recheck renal parameters in am.   Anemia of chronic disease:  Hemoglobin baseline around 9, on admission at 8.2 dropped further to 7.6.  NO obvious bleeding seen.  Tbili 0.9 less likely Hemolysis Does not appear volume overloaded  anemia labs showed low normal B12 at  231,  started B12 supplement, iron panel not consistent with iron deficiency, Transfuse to keep hemoglobin greater than 7.     Elevated Liver enzymes, appear transient  Unclear etiology,  Negative hepatitis panel, CK abnormally low ,  abdominal ultrasound "1. Hepatic steatosis. 2. Status post cholecystectomy." [name] can cause hepatotoxicity as well Lft has improved , Monitor. Recheck liver panel in am.   Hypertension:  Well controlled on Lopressor  Stage 4 adenocarcinoma  of the lung Hold adagrasib due to side effect : pneumonitis, pneumonia, cough, dyspnea and elevated LFT which patient is having now   Follow up withDr Arbutus Ped.   FTT Seen by PT who recommended snf , patient is in agreement  Code Status: full code.  DVT Prophylaxis:  enoxaparin (LOVENOX) injection 40 mg Start: 03/20/22 1000 SCDs Start: 03/15/22 2335   Family Communication: none at bedside  Disposition Plan:     . Snf, appear start to improve on 1/20, possible to snf on Monday if continue improve with steroid taper   Antimicrobials:   Anti-infectives (From admission, onward)    Start     Dose/Rate Route Frequency Ordered Stop   03/19/22 1815  azithromycin (ZITHROMAX) tablet 500 mg        500 mg Oral Daily 03/19/22 1718 03/24/22 0959   03/19/22 1800  cefTRIAXone (ROCEPHIN) 1 g in sodium chloride 0.9 % 100 mL IVPB        1 g 200 mL/hr over 30 Minutes Intravenous Every 24 hours 03/19/22 1718     03/16/22 1000  oseltamivir (TAMIFLU) capsule 30 mg        30 mg Oral Daily 03/15/22 2359 03/19/22 0849   03/15/22 2345  oseltamivir (TAMIFLU) capsule 75 mg  Status:  Discontinued        75 mg Oral 2 times daily 03/15/22 2334 03/15/22 2358        Medications  Scheduled Meds:  aspirin  81 mg Oral Daily   azithromycin  500 mg Oral Daily   buPROPion  150 mg Oral Daily   Chlorhexidine Gluconate Cloth  6 each Topical Daily   vitamin B-12  1,000 mcg Oral Daily   enoxaparin (LOVENOX) injection  40 mg Subcutaneous Q24H   fluticasone furoate-vilanterol  1 puff Inhalation Daily   And   umeclidinium bromide  1 puff Inhalation Daily   guaiFENesin  600 mg Oral BID   insulin aspart  0-15 Units Subcutaneous TID WC   insulin aspart  0-5 Units Subcutaneous QHS   insulin aspart  3 Units Subcutaneous TID WC   insulin glargine-yfgn  10 Units Subcutaneous BID   LORazepam  2 mg Oral QPM   methylPREDNISolone (SOLU-MEDROL) injection  40 mg Intravenous Q12H   metoprolol tartrate  12.5 mg Oral BID   polyethylene glycol  17 g Oral Daily   saccharomyces boulardii  250 mg Oral BID   senna-docusate  1 tablet Oral BID   sodium chloride  flush  10-40 mL Intracatheter Q12H   sodium chloride flush  3 mL Intravenous Q12H   Continuous Infusions:  cefTRIAXone (ROCEPHIN)  IV 1 g (03/20/22 1745)    PRN Meds:.acetaminophen **OR** acetaminophen, albuterol, benzonatate, bisacodyl, hydrALAZINE, HYDROcodone bit-homatropine, nitroGLYCERIN, ondansetron (ZOFRAN) IV, ondansetron **OR** ondansetron (ZOFRAN) IV, senna-docusate, sodium chloride flush    Subjective:   she reports  feeling nauseous  Denies  pain Still has intermittent cough, first day I did not hear wheezing on exam today  No edema, no fever   Objective:   Vitals:  03/20/22 1336 03/20/22 2120 03/21/22 0120 03/21/22 0514  BP: 128/72 136/70  114/61  Pulse: 65 64  74  Resp: 18 18  19   Temp: 97.9 F (36.6 C) 97.9 F (36.6 C)  (!) 97.5 F (36.4 C)  TempSrc:      SpO2: 100% 97% 96% 100%  Weight:      Height:        Intake/Output Summary (Last 24 hours) at 03/21/2022 0748 Last data filed at 03/20/2022 1357 Gross per 24 hour  Intake 240 ml  Output --  Net 240 ml   Filed Weights   03/15/22 1831  Weight: 79.4 kg     Exam General exam: Appears calm and comfortable  Respiratory system: bilateral Wheezing appear has resolved on 1/20, normal respiratory effort at rest Cardiovascular system: S1 & S2 heard, RRR. No JVD,  Gastrointestinal system: Abdomen is nondistended, soft and nontender.  Central nervous system: Alert and oriented. No focal neurological deficits. Extremities: No edema Skin: No rashes,  Psychiatry:  Mood & affect appropriate.    Data Reviewed:  I have personally reviewed following labs and imaging studies   CBC Lab Results  Component Value Date   WBC 12.8 (H) 03/19/2022   RBC 2.48 (L) 03/19/2022   RBC 2.47 (L) 03/19/2022   HGB 7.5 (L) 03/19/2022   HCT 24.3 (L) 03/19/2022   MCV 98.4 03/19/2022   MCH 30.4 03/19/2022   PLT 229 03/19/2022   MCHC 30.9 03/19/2022   RDW 15.2 03/19/2022   LYMPHSABS 0.7 03/19/2022   MONOABS 0.4  03/19/2022   EOSABS 0.0 03/19/2022   BASOSABS 0.0 03/19/2022     Last metabolic panel Lab Results  Component Value Date   NA 138 03/21/2022   K 4.5 03/21/2022   CL 107 03/21/2022   CO2 26 03/21/2022   BUN 60 (H) 03/21/2022   CREATININE 1.49 (H) 03/21/2022   GLUCOSE 251 (H) 03/21/2022   GFRNONAA 38 (L) 03/21/2022   GFRAA 48 (L) 10/27/2019   CALCIUM 8.5 (L) 03/21/2022   PHOS 4.4 03/21/2022   PROT 6.0 (L) 03/19/2022   ALBUMIN 2.8 (L) 03/19/2022   LABGLOB 2.5 10/06/2016   AGRATIO 1.8 10/06/2016   BILITOT 0.5 03/19/2022   ALKPHOS 81 03/19/2022   AST 23 03/19/2022   ALT 81 (H) 03/19/2022   ANIONGAP 5 03/21/2022    CBG (last 3)  Recent Labs    03/20/22 1641 03/20/22 2116 03/21/22 0303  GLUCAP 157* 211* 332*      Coagulation Profile: No results for input(s): "INR", "PROTIME" in the last 168 hours.   Radiology Studies: CT CHEST WO CONTRAST  Result Date: 03/20/2022 CLINICAL DATA:  COPD exacerbation, persistent cough despite treatment EXAM: CT CHEST WITHOUT CONTRAST TECHNIQUE: Multidetector CT imaging of the chest was performed following the standard protocol without IV contrast. RADIATION DOSE REDUCTION: This exam was performed according to the departmental dose-optimization program which includes automated exposure control, adjustment of the mA and/or kV according to patient size and/or use of iterative reconstruction technique. COMPARISON:  01/15/2022 FINDINGS: Cardiovascular: Atherosclerotic calcifications aorta and coronary arteries. Heart size normal. No pericardial effusion. Aorta normal caliber. RIGHT jugular Port-A-Cath with tip in SVC. Mediastinum/Nodes: Esophagus normal appearance. Base of cervical region unremarkable. No thoracic adenopathy. Lungs/Pleura: Centrilobular emphysema greatest in upper lobes. Central peribronchial thickening. Subsolid opacity RIGHT upper lobe 17 x 10 mm image 48 unchanged. Additional small focus of subsolid opacity in the superior segment  LEFT lower lobe abutting major fissure, 13 x 10 mm, stable.  Biapical scarring. Minimal peribronchovascular infiltrate RIGHT middle lobe and lingula, slightly increased. Calcified granuloma LEFT lower lobe image 115. Additional areas of minimal ground-glass opacity in th both upper lobes unchanged. RIGHT lower lobe nodule identified on previous exam no longer identified. 3 mm nodule posterior RIGHT upper lobe image 37 not definitely seen previously. No additional mass, nodule, infiltrate, or effusion. Upper Abdomen: Post cholecystectomy. Remaining visualized upper abdomen unremarkable Musculoskeletal: Osseous demineralization. Chronic compression fracture of T5 vertebral body with 40% anterior height loss again seen. IMPRESSION: Emphysematous and bronchitic changes with scattered chronic opacities in the upper lobes, stable. Minimal peribronchovascular infiltrate RIGHT middle lobe and lingula, slightly increased question related to bronchiolitis or atypical infection. Stable subsolid opacities in RIGHT upper lobe and superior segment LEFT lower lobe. 3 mm posterior RIGHT upper lobe nodule not definitely seen previously. Scattered atherosclerotic calcifications including coronary arteries. No acute intrathoracic abnormalities. Aortic Atherosclerosis (ICD10-I70.0) and Emphysema (ICD10-J43.9). Electronically Signed   By: Ulyses Southward M.D.   On: 03/20/2022 17:59       Albertine Grates MD PhD FACP [facility] 03/21/2022, 7:48 AM  Available via Epic secure chat 7am-7pm After 7 pm, please refer to night coverage provider listed on amion.

## 2022-03-21 NOTE — TOC PASRR Note (Signed)
30 Day PASRR Note  Patient Details  Name: [name] Date of Birth: 1955/05/24  [facility]) CM/SW Contact:    Ewing Schlein, LCSW Phone Number: 03/21/2022, 11:11 AM  To Whom It May Concern:  Please be advised that this patient will require a short-term nursing home stay - anticipated 30 days or less for rehabilitation and strengthening. The plan is for return home.

## 2022-03-22 DIAGNOSIS — J101 Influenza due to other identified influenza virus with other respiratory manifestations: Secondary | ICD-10-CM | POA: Diagnosis not present

## 2022-03-22 LAB — BASIC METABOLIC PANEL
Anion gap: 6 (ref 5–15)
BUN: 53 mg/dL — ABNORMAL HIGH (ref 8–23)
CO2: 23 mmol/L (ref 22–32)
Calcium: 8 mg/dL — ABNORMAL LOW (ref 8.9–10.3)
Chloride: 109 mmol/L (ref 98–111)
Creatinine, Ser: 1.53 mg/dL — ABNORMAL HIGH (ref 0.44–1.00)
GFR, Estimated: 37 mL/min — ABNORMAL LOW (ref >60)
Glucose, Bld: 243 mg/dL — ABNORMAL HIGH (ref 70–99)
Potassium: 4.3 mmol/L (ref 3.5–5.1)
Sodium: 138 mmol/L (ref 135–145)

## 2022-03-22 LAB — HEPATIC FUNCTION PANEL
ALT: 165 U/L — ABNORMAL HIGH (ref 0–44)
AST: 63 U/L — ABNORMAL HIGH (ref 15–41)
Albumin: 2.8 g/dL — ABNORMAL LOW (ref 3.5–5.0)
Alkaline Phosphatase: 72 U/L (ref 38–126)
Bilirubin, Direct: <0.1 mg/dL (ref 0.0–0.2)
Total Bilirubin: 0.4 mg/dL (ref 0.3–1.2)
Total Protein: 5.8 g/dL — ABNORMAL LOW (ref 6.5–8.1)

## 2022-03-22 LAB — CBC
HCT: 26.8 % — ABNORMAL LOW (ref 36.0–46.0)
Hemoglobin: 8.4 g/dL — ABNORMAL LOW (ref 12.0–15.0)
MCH: 30.2 pg (ref 26.0–34.0)
MCHC: 31.3 g/dL (ref 30.0–36.0)
MCV: 96.4 fL (ref 80.0–100.0)
Platelets: 234 10*3/uL (ref 150–400)
RBC: 2.78 MIL/uL — ABNORMAL LOW (ref 3.87–5.11)
RDW: 15.2 % (ref 11.5–15.5)
WBC: 15.5 10*3/uL — ABNORMAL HIGH (ref 4.0–10.5)
nRBC: 0 % (ref 0.0–0.2)

## 2022-03-22 LAB — URINE CULTURE: Culture: NO GROWTH

## 2022-03-22 LAB — GLUCOSE, CAPILLARY
Glucose-Capillary: 218 mg/dL — ABNORMAL HIGH (ref 70–99)
Glucose-Capillary: 202 mg/dL — ABNORMAL HIGH (ref 70–99)
Glucose-Capillary: 149 mg/dL — ABNORMAL HIGH (ref 70–99)
Glucose-Capillary: 202 mg/dL — ABNORMAL HIGH (ref 70–99)
Glucose-Capillary: 282 mg/dL — ABNORMAL HIGH (ref 70–99)

## 2022-03-22 MED ORDER — SODIUM CHLORIDE 0.9 % IV SOLN
1.0000 g | INTRAVENOUS | Status: AC
  Administered 2022-03-23: 1 g via INTRAVENOUS
  Filled 2022-03-22: qty 10

## 2022-03-22 MED ORDER — PREDNISONE 20 MG PO TABS
40.0000 mg | ORAL_TABLET | Freq: Every day | ORAL | Status: AC
  Administered 2022-03-23 – 2022-03-26 (×4): 40 mg via ORAL
  Filled 2022-03-22 (×4): qty 2

## 2022-03-22 NOTE — Plan of Care (Signed)

## 2022-03-22 NOTE — Progress Notes (Addendum)
[facility]                                                                               [name], is a 67 y.o. female, DOB - [birth year], MVM:[id number] Admit date - 03/15/2022    Outpatient Primary MD for the patient is [name], NP  LOS - 7  days    Brief summary    [name] is a 67 y.o. 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  Her symptoms started about 5 days ago.  She was seen by her oncologist who prescribed doxycycline however her symptoms progressively worsened . patient had been off of chemotherapy for about 2 years until approximately 1 month ago when she started a new oral chemotherapy.  Patient was admitted for further management of influenza A, AKI likely secondary to dehydration.    Assessment & Plan    Assessment and Plan:  Acute on chronic respiratory failure (At home she is between 2.5 to 3 lit [place]) - secondary to influenza A in the setting of stage 4 adenocarcinoma of the lung, COPD exacerbation  -Finished treatment with  tamiflu,  - started on rocephin and zithromax on 1/19 due to lack of clinical improvement.  -CT chest obtained on 1/19 due to lack of clinical improvement showed "Emphysematous and bronchitic changes with scattered chronic opacities in the upper lobes, stable.   Minimal peribronchovascular infiltrate RIGHT middle lobe and lingula, slightly increased question related to bronchiolitis or atypical infection.   Stable subsolid opacities in RIGHT upper lobe and superior segment LEFT lower lobe.   3 mm posterior RIGHT upper lobe nodule not definitely seen previously.   Scattered atherosclerotic calcifications including coronary arteries.   No acute intrathoracic  abnormalities.   Aortic Atherosclerosis (ICD10-I70.0) and Emphysema (ICD10-J43.9)."  -improved after started on abx, started to show improvement on 1/20, on 1/21 she still have cough but much less,wheezing has finally resolved,  much improved aeration, O2 requirement back to baseline with less dyspnea on exertion -continue abx to finish total of 5 days treatment, taper steroids to off  in 5 days - hold Leshara as can cause pneumonitis, pneumonia, cough, dyspnea and elevated LFT   Steroid-induced hyperglycemia/new diagnosis of diabetes with hyperglycemia No prior diagnosis of diabetes Hemoglobin A1c at 6.6 Currently on SSI and carb modified diet, am blood glucose 243  -currently on  long acting insulin to 10units bid, add on meal coverage  and ssi -Taper steroid, continue adjust insulin as needed  Leukocytosis From stress , steroids? Lactic acid wnl.  WBC count is improving  blood cultures from 1/17  no growth  Taper steroid  Hyperkalemia:  Resolved with Lokelma.   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 , Admitted with a creatinine of 2.3  , improved to 1.49-1.53  Off IV fluids ,  encourage oral intake  Monitor bmp, renal dosing meds  Anemia of chronic disease:  Hemoglobin baseline around 9, on admission at 8.2 dropped further to 7.6.  NO obvious bleeding seen.  Tbili 0.9 less likely Hemolysis Does not appear volume overloaded  anemia labs showed low normal B12 at 231,  started B12 supplement, iron panel not consistent with iron deficiency, stool is brown/clay color, FOBT + but reports hemorrhoids Transfuse to keep hemoglobin greater than 7.     Elevated Liver enzymes, appear transient  Unclear etiology,  Negative hepatitis panel, CK abnormally low ,  abdominal ultrasound "1. Hepatic steatosis. 2. Status post  cholecystectomy." [name] can cause hepatotoxicity as well Lft still fluctuating , Monitor. Recheck liver panel in am.   Hypertension:  Well controlled on Lopressor  Stage 4 adenocarcinoma  of the lung Hold adagrasib due to side effect : pneumonitis, pneumonia, cough, dyspnea and elevated LFT which patient is having now  Follow up withDr Arbutus Ped.   FTT Seen by PT who recommended snf , patient is in agreement  Code Status: full code.  DVT Prophylaxis:  enoxaparin (LOVENOX) injection 40 mg Start: 03/20/22 1000 SCDs Start: 03/15/22 2335   Family Communication: none at bedside  Disposition Plan:    likely able to d/c to snf on Monday if continue to be stable and bed is available    Antimicrobials:   Anti-infectives (From admission, onward)    Start     Dose/Rate Route Frequency Ordered Stop   03/23/22 1800  cefTRIAXone (ROCEPHIN) 1 g in sodium chloride 0.9 % 100 mL IVPB        1 g 200 mL/hr over 30 Minutes Intravenous Every 24 hours 03/22/22 2009 03/24/22 1759   03/19/22 1815  azithromycin (ZITHROMAX) tablet 500 mg        500 mg Oral Daily 03/19/22 1718 03/24/22 0959   03/19/22 1800  cefTRIAXone (ROCEPHIN) 1 g in sodium chloride 0.9 % 100 mL IVPB  Status:  Discontinued        1 g 200 mL/hr over 30 Minutes Intravenous Every 24 hours 03/19/22 1718 03/22/22 2009   03/16/22 1000  oseltamivir (TAMIFLU) capsule 30 mg        30 mg Oral Daily 03/15/22 2359 03/19/22 0849   03/15/22 2345  oseltamivir (TAMIFLU) capsule 75 mg  Status:  Discontinued        75 mg Oral 2 times daily 03/15/22 2334 03/15/22 2358        Medications  Scheduled Meds:  aspirin  81 mg Oral Daily   azithromycin  500 mg Oral Daily   buPROPion  150 mg Oral Daily   calcium carbonate  1 tablet Oral Daily   Chlorhexidine Gluconate Cloth  6 each Topical Daily   vitamin B-12  1,000 mcg Oral Daily   enoxaparin (LOVENOX) injection  40 mg Subcutaneous Q24H   famotidine  10 mg Oral QHS   fluticasone  furoate-vilanterol  1 puff Inhalation Daily   And   umeclidinium bromide  1 puff Inhalation Daily   guaiFENesin  600 mg Oral BID   insulin aspart  0-15 Units Subcutaneous TID WC   insulin aspart  0-5 Units Subcutaneous QHS   insulin aspart  3 Units Subcutaneous TID WC   insulin glargine-yfgn  10 Units Subcutaneous BID   LORazepam  2 mg Oral QPM   metoprolol tartrate  12.5 mg Oral BID   [START ON 03/23/2022] predniSONE  40 mg Oral Q breakfast   saccharomyces boulardii  250  mg Oral BID   sodium chloride flush  10-40 mL Intracatheter Q12H   sodium chloride flush  3 mL Intravenous Q12H   Continuous Infusions:  [START ON 03/23/2022] cefTRIAXone (ROCEPHIN)  IV      PRN Meds:.acetaminophen **OR** acetaminophen, albuterol, benzonatate, bisacodyl, hydrALAZINE, HYDROcodone bit-homatropine, lip balm, metoCLOPramide (REGLAN) injection, nitroGLYCERIN, ondansetron (ZOFRAN) IV, ondansetron **OR** ondansetron (ZOFRAN) IV, phenol, senna-docusate, sodium chloride flush    Subjective:   she reports  breathing is better, less cough, less DOE No edema, no fever   Objective:   Vitals:   03/21/22 2152 03/22/22 0608 03/22/22 0855 03/22/22 1346  BP: (!) 122/54 (!) 121/55  124/72  Pulse: 67 69  69  Resp: 20 18  17   Temp: 98.8 F (37.1 C) (!) 97.4 F (36.3 C)  97.9 F (36.6 C)  TempSrc: Oral     SpO2: 100% 100% 100% 98%  Weight:      Height:        Intake/Output Summary (Last 24 hours) at 03/22/2022 2009 Last data filed at 03/22/2022 1315 Gross per 24 hour  Intake 523 ml  Output --  Net 523 ml   Filed Weights   03/15/22 1831  Weight: 79.4 kg     Exam General exam: Appears calm and comfortable  Respiratory system: bilateral Wheezing has  finally resolved, normal respiratory effort at rest, less cough  Cardiovascular system: S1 & S2 heard, RRR. No JVD,  Gastrointestinal system: Abdomen is nondistended, soft and nontender.  Central nervous system: Alert and oriented. No focal  neurological deficits. Extremities: No edema Skin: No rashes,  Psychiatry:  Mood & affect appropriate.    Data Reviewed:  I have personally reviewed following labs and imaging studies   CBC Lab Results  Component Value Date   WBC 15.5 (H) 03/22/2022   RBC 2.78 (L) 03/22/2022   HGB 8.4 (L) 03/22/2022   HCT 26.8 (L) 03/22/2022   MCV 96.4 03/22/2022   MCH 30.2 03/22/2022   PLT 234 03/22/2022   MCHC 31.3 03/22/2022   RDW 15.2 03/22/2022   LYMPHSABS 0.7 03/19/2022   MONOABS 0.4 03/19/2022   EOSABS 0.0 03/19/2022   BASOSABS 0.0 03/19/2022     Last metabolic panel Lab Results  Component Value Date   NA 138 03/22/2022   K 4.3 03/22/2022   CL 109 03/22/2022   CO2 23 03/22/2022   BUN 53 (H) 03/22/2022   CREATININE 1.53 (H) 03/22/2022   GLUCOSE 243 (H) 03/22/2022   GFRNONAA 37 (L) 03/22/2022   GFRAA 48 (L) 10/27/2019   CALCIUM 8.0 (L) 03/22/2022   PHOS 4.4 03/21/2022   PROT 5.8 (L) 03/22/2022   ALBUMIN 2.8 (L) 03/22/2022   LABGLOB 2.5 10/06/2016   AGRATIO 1.8 10/06/2016   BILITOT 0.4 03/22/2022   ALKPHOS 72 03/22/2022   AST 63 (H) 03/22/2022   ALT 165 (H) 03/22/2022   ANIONGAP 6 03/22/2022    CBG (last 3)  Recent Labs    03/22/22 0814 03/22/22 1216 03/22/22 1720  GLUCAP 202* 149* 202*      Coagulation Profile: No results for input(s): "INR", "PROTIME" in the last 168 hours.   Radiology Studies: No results found.     03/24/22 MD PhD FACP [facility] 03/22/2022, 8:09 PM  Available via Epic secure chat 7am-7pm After 7 pm, please refer to night coverage provider listed on amion.

## 2022-03-22 NOTE — Progress Notes (Signed)
PT Cancellation Note  Patient Details Name: [name] MRN: [id number] DOB: 1955/08/03   Cancelled Treatment:     PT attempted but deferred at pt request 2* ongoing diarrhea.  Will follow.   [name] 03/22/2022, 3:39 PM

## 2022-03-23 DIAGNOSIS — R0602 Shortness of breath: Secondary | ICD-10-CM | POA: Diagnosis not present

## 2022-03-23 DIAGNOSIS — J101 Influenza due to other identified influenza virus with other respiratory manifestations: Secondary | ICD-10-CM | POA: Diagnosis not present

## 2022-03-23 LAB — GLUCOSE, CAPILLARY
Glucose-Capillary: 138 mg/dL — ABNORMAL HIGH (ref 70–99)
Glucose-Capillary: 74 mg/dL (ref 70–99)
Glucose-Capillary: 143 mg/dL — ABNORMAL HIGH (ref 70–99)
Glucose-Capillary: 206 mg/dL — ABNORMAL HIGH (ref 70–99)
Glucose-Capillary: 158 mg/dL — ABNORMAL HIGH (ref 70–99)

## 2022-03-23 LAB — CBC WITH DIFFERENTIAL/PLATELET
Abs Immature Granulocytes: 0.63 10*3/uL — ABNORMAL HIGH (ref 0.00–0.07)
Basophils Absolute: 0 10*3/uL (ref 0.0–0.1)
Basophils Relative: 0 %
Eosinophils Absolute: 0.1 10*3/uL (ref 0.0–0.5)
Eosinophils Relative: 0 %
HCT: 26.1 % — ABNORMAL LOW (ref 36.0–46.0)
Hemoglobin: 8.4 g/dL — ABNORMAL LOW (ref 12.0–15.0)
Immature Granulocytes: 5 %
Lymphocytes Relative: 15 %
Lymphs Abs: 1.9 10*3/uL (ref 0.7–4.0)
MCH: 31.2 pg (ref 26.0–34.0)
MCHC: 32.2 g/dL (ref 30.0–36.0)
MCV: 97 fL (ref 80.0–100.0)
Monocytes Absolute: 0.9 10*3/uL (ref 0.1–1.0)
Monocytes Relative: 7 %
Neutro Abs: 9.4 10*3/uL — ABNORMAL HIGH (ref 1.7–7.7)
Neutrophils Relative %: 73 %
Platelets: 197 10*3/uL (ref 150–400)
RBC: 2.69 MIL/uL — ABNORMAL LOW (ref 3.87–5.11)
RDW: 15.3 % (ref 11.5–15.5)
WBC: 12.9 10*3/uL — ABNORMAL HIGH (ref 4.0–10.5)
nRBC: 0 % (ref 0.0–0.2)

## 2022-03-23 LAB — COMPREHENSIVE METABOLIC PANEL
ALT: 155 U/L — ABNORMAL HIGH (ref 0–44)
AST: 38 U/L (ref 15–41)
Albumin: 2.8 g/dL — ABNORMAL LOW (ref 3.5–5.0)
Alkaline Phosphatase: 66 U/L (ref 38–126)
Anion gap: 7 (ref 5–15)
BUN: 40 mg/dL — ABNORMAL HIGH (ref 8–23)
CO2: 24 mmol/L (ref 22–32)
Calcium: 8.1 mg/dL — ABNORMAL LOW (ref 8.9–10.3)
Chloride: 107 mmol/L (ref 98–111)
Creatinine, Ser: 1.28 mg/dL — ABNORMAL HIGH (ref 0.44–1.00)
GFR, Estimated: 46 mL/min — ABNORMAL LOW (ref >60)
Glucose, Bld: 160 mg/dL — ABNORMAL HIGH (ref 70–99)
Potassium: 4 mmol/L (ref 3.5–5.1)
Sodium: 138 mmol/L (ref 135–145)
Total Bilirubin: 0.4 mg/dL (ref 0.3–1.2)
Total Protein: 5.4 g/dL — ABNORMAL LOW (ref 6.5–8.1)

## 2022-03-23 LAB — CULTURE, BLOOD (ROUTINE X 2)
Culture: NO GROWTH
Culture: NO GROWTH
Special Requests: ADEQUATE
Special Requests: ADEQUATE

## 2022-03-23 MED ORDER — INSULIN ASPART 100 UNIT/ML IJ SOLN
0.0000 [IU] | INTRAMUSCULAR | Status: DC
  Administered 2022-03-24: 3 [IU] via SUBCUTANEOUS
  Administered 2022-03-24 – 2022-03-25 (×2): 2 [IU] via SUBCUTANEOUS
  Administered 2022-03-25: 5 [IU] via SUBCUTANEOUS
  Administered 2022-03-25: 2 [IU] via SUBCUTANEOUS
  Administered 2022-03-26: 3 [IU] via SUBCUTANEOUS
  Administered 2022-03-26: 1 [IU] via SUBCUTANEOUS
  Administered 2022-03-26: 3 [IU] via SUBCUTANEOUS

## 2022-03-23 MED ORDER — INSULIN ASPART 100 UNIT/ML IJ SOLN
6.0000 [IU] | Freq: Three times a day (TID) | INTRAMUSCULAR | Status: DC
  Administered 2022-03-24 – 2022-03-27 (×9): 6 [IU] via SUBCUTANEOUS

## 2022-03-23 NOTE — Evaluation (Signed)
SATURATION QUALIFICATIONS: Patient Saturations on Room Air at Rest = 89%  Patient Saturations on [employer] while Ambulating = 86%  Patient Saturations on  2 Liters of oxygen while Ambulating = 93%  Please briefly explain why patient needs home oxygen: Patient ambulated on room air and oxygen saturation is 87%. Patient is short of breath with exertion. Patient placed on oxygen on 2 liters and was able to recuperate oxygen saturation back to 93% on 2 liters.

## 2022-03-23 NOTE — Inpatient Diabetes Management (Signed)
Inpatient Diabetes Program Recommendations  AACE/ADA: New Consensus Statement on Inpatient Glycemic Control (2015)  Target Ranges:  Prepandial:   less than 140 mg/dL      Peak postprandial:   less than 180 mg/dL (1-2 hours)      Critically ill patients:  140 - 180 mg/dL    Latest Reference Range & Units 03/22/22 08:14 03/22/22 12:16 03/22/22 17:20 03/22/22 20:49  Glucose-Capillary 70 - 99 mg/dL 596 (H) 443 (H) 667 (H) 282 (H)    Latest Reference Range & Units 03/23/22 07:59 03/23/22 11:19  Glucose-Capillary 70 - 99 mg/dL 74 286 (H)  (H): Data is abnormally high     Current: Semglee 10 units BID   Novolog 0-15 units TID ac/hs   Novolog 3 units TID with meals    Stopped Solumedrol--last dose given 12pm on 01/20--Started Prednisone 50 mg daily yest--Dose reduced to 40 mg daily this AM   MD- Note CBG only 74 this AM.  No Diabetes meds at home.  Could we reduce her Semglee insulin to 8 units BID?     --Will follow patient during hospitalization--  Ambrose Finland RN, MSN, CDCES Diabetes Coordinator Inpatient Glycemic Control Team Team Pager: [phone] (8a-5p)

## 2022-03-23 NOTE — Progress Notes (Addendum)
Physical Therapy Treatment Patient Details Name: [name] MRN: [id number] DOB: 20-Mar-1955 Today's Date: 03/23/2022   History of Present Illness [name] is a 67 y.o. female admitted for further management of influenza A, AKI likely secondary to dehydration. 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 Pt on 2.5L/min via nasal cannula throughout session. Pt is able to complete bed mobility with mod I, STS with mod I, and ambulate short distances in room with RW & supervision. Pt assists with managing O2 tubing throughout mobility & reports she is doing better compared to when she came in. Pt notes she had started using w/c for community distances prior to admission 2/2 ongoing lung CA txs. Due to improvement in functional mobility, educated pt & family on updated PT recommendation of HHPT f/u vs SNF rehab. Pt & family voice understanding. Will continue to follow pt acutely to address endurance/activity tolerance, gait, & stair negotiation.  RN in room after PT, SpO2 on 2.5L/min 97%.    Recommendations for follow up therapy are one component of a multi-disciplinary discharge planning process, led by the attending physician.  Recommendations may be updated based on patient status, additional functional criteria and insurance authorization.  Follow Up Recommendations  Home health PT     Assistance Recommended at Discharge Intermittent Supervision/Assistance  Patient can return home with the following A little help with walking and/or transfers;A little help with bathing/dressing/bathroom;Assistance with cooking/housework;Assist for transportation;Help with stairs or ramp for entrance   Equipment Recommendations  None recommended by PT    Recommendations for Other Services        Precautions / Restrictions Precautions Precautions: Fall Restrictions Weight Bearing Restrictions: No     Mobility  Bed Mobility Overal bed mobility: Modified Independent Bed Mobility: Supine to Sit     Supine to sit: Modified independent (Device/Increase time), HOB elevated Sit to supine: Modified independent (Device/Increase time), HOB elevated        Transfers Overall transfer level: Modified independent Equipment used: None Transfers: Sit to/from Stand, Bed to chair/wheelchair/BSC Sit to Stand: Modified independent (Device/Increase time)   Step pivot transfers: Supervision (bed>recliner with RW)       General transfer comment: STS from EOB & recliner, educational cuing re: hand placement with RW    Ambulation/Gait Ambulation/Gait assistance: Supervision Gait Distance (Feet): 30 Feet Assistive device: Rolling walker (2 wheels) Gait Pattern/deviations: WFL(Within Functional Limits)       General Gait Details: Pt ambulates 2 laps in room with RW & supervision, pt assists with managing O2 tubing   Stairs             Wheelchair Mobility    Modified Rankin (Stroke Patients Only)       Balance Overall balance assessment: Needs assistance Sitting-balance support: Feet supported Sitting balance-Leahy Scale: Good     Standing balance support: During functional activity, Bilateral upper extremity supported Standing balance-Leahy Scale: Good                              Cognition Arousal/Alertness: Awake/alert Behavior During Therapy: WFL for tasks assessed/performed Overall Cognitive Status: Within Functional Limits for tasks assessed  Exercises      General Comments General comments (skin integrity, edema, etc.): Pt on 2.5L/min via nasal cannula throughout session. Pt endorses some lightheadedness but primarily limited by nausea (declined further ambulation 2/2 nausea)       Pertinent Vitals/Pain Pain Assessment Pain Assessment: Faces Faces Pain Scale: No hurt    Home Living                          Prior Function            PT Goals (current goals can now be found in the care plan section) Acute Rehab PT Goals Patient Stated Goal: "I don't want to start coughing again" PT Goal Formulation: With patient Time For Goal Achievement: 04/01/22 Potential to Achieve Goals: Good Progress towards PT goals: Progressing toward goals    Frequency    Min 3X/week      PT Plan Discharge plan needs to be updated    Co-evaluation              AM-PAC PT "6 Clicks" Mobility   Outcome Measure  Help needed turning from your back to your side while in a flat bed without using bedrails?: None Help needed moving from lying on your back to sitting on the side of a flat bed without using bedrails?: None Help needed moving to and from a bed to a chair (including a wheelchair)?: None Help needed standing up from a chair using your arms (e.g., wheelchair or bedside chair)?: A Little Help needed to walk in hospital room?: A Little Help needed climbing 3-5 steps with a railing? : A Little 6 Click Score: 21    End of Session Equipment Utilized During Treatment: Oxygen Activity Tolerance: Patient tolerated treatment well (limited by c/o nausea) Patient left: in chair;with call bell/phone within reach;with nursing/sitter in room Nurse Communication: Mobility status (notified nurse of pt c/o intermittent lightheadedness) PT Visit Diagnosis: Muscle weakness (generalized) (M62.81)     Time: 6153-7943 PT Time Calculation (min) (ACUTE ONLY): 14 min  Charges:  $Therapeutic Activity: 8-22 mins                     Aleda Grana, PT, DPT 03/23/22, 3:33 PM   Sandi Mariscal 03/23/2022, 3:17 PM

## 2022-03-23 NOTE — Care Management Important Message (Signed)
Important Message  Patient Details IM Letter given. Name: VICENTA OLDS MRN: [id number] Date of Birth: 1956/02/15   Medicare Important Message Given:  Yes     Caren Macadam 03/23/2022, 10:33 AM

## 2022-03-23 NOTE — TOC Progression Note (Signed)
Transition of Care [facility]) - Progression Note    Patient Details  Name: [name] MRN: [id number] Date of Birth: 28-Apr-1955  [facility]) CM/SW Contact  Amada [place], [place] Phone Number: 03/23/2022, 2:38 PM  Clinical Narrative:     Have updated FL2 with new PASRR auth.  Have reviewed SNF bed offers with pt who prefers [facility] (or [facility] if they could possibly offer).  Reaching out to facilities.  MD aware and will alert TOC if medically ready for dc as early as tomorrow.  Expected Discharge Plan: Skilled Nursing Facility Barriers to Discharge: Continued Medical Work up, Awaiting State Approval Cher[facility] Roberts), SNF Pending bed offer  Expected Discharge Plan and Services In-house Referral: Clinical Social Work Discharge Planning Services: CM Consult Post Acute Care Choice: Skilled Nursing Facility Living arrangements for the past 2 months: Single Family Home                 DME Arranged: N/A DME Agency: NA                   Social Determinants of Health (SDOH) Interventions SDOH Screenings   Food Insecurity: No Food Insecurity (03/16/2022)  Housing: Low Risk  (03/16/2022)  Transportation Needs: No Transportation Needs (03/16/2022)  Utilities: Not At Risk (03/16/2022)  Tobacco Use: Medium Risk (03/15/2022)    Readmission Risk Interventions    03/19/2022   12:16 PM 03/19/2022   11:41 AM  Readmission Risk Prevention Plan  Transportation Screening Complete Complete  PCP or Specialist Appt within 5-7 Days  Complete  Home Care Screening  Complete  Medication Review (RN CM)  Complete

## 2022-03-23 NOTE — Progress Notes (Signed)
[name] HGZ:[id number] DOB: 1956/01/05 DOA: 03/15/2022 PCP: [name], NP   Subj: [name] is a 67 y.o. 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  Her symptoms started about 5 days ago.  She was seen by her oncologist who prescribed doxycycline however her symptoms progressively worsened . patient had been off of chemotherapy for about 2 years until approximately 1 month ago when she started a new oral chemotherapy.  Patient was admitted for further management of influenza A, AKI likely secondary to dehydration.    Obj: 1/22 A/O x 4, continue SOB, weakness   Objective: VITAL SIGNS: Temp: 97.8 F (36.6 C) (01/21 2047) Temp Source: Oral (01/21 2047) BP: 121/75 (01/21 2047) Pulse Rate: 74 (01/21 2047)   VENTILATOR SETTINGS: **   Intake/Output Summary (Last 24 hours) at 03/23/2022 0841 Last data filed at 03/22/2022 1315 Gross per 24 hour  Intake 523 ml  Output --  Net 523 ml     Exam: Physical Exam:  General: A/O x 4 No acute respiratory distress Eyes: negative scleral hemorrhage, negative anisocoria, negative icterus ENT: Negative Runny nose, negative gingival bleeding, Neck:  Negative scars, masses, torticollis, lymphadenopathy, JVD Lungs: diffuse poor air movement bilaterally, positive expiratory wheeze, negative crackles  Cardiovascular: Regular rate and rhythm without murmur gallop or rub normal S1 and S2 Abdomen: negative abdominal pain, nondistended, positive soft, bowel sounds, no rebound, no ascites, no appreciable mass Extremities: No significant cyanosis, clubbing, or edema bilateral lower extremities Skin: Negative rashes, lesions, ulcers Psychiatric:  Negative depression, negative anxiety, negative fatigue, negative mania   Central nervous system:  Cranial nerves II through XII intact, tongue/uvula midline, all extremities muscle strength 5/5, sensation intact throughout, negative dysarthria, negative expressive aphasia, negative receptive aphasia.   .     Mobility Assessment (last 72 hours)     Mobility Assessment     Row Name 03/22/22 1300 03/21/22 1933 03/20/22 1023       Does patient have an order for bedrest or is patient medically unstable No - Continue assessment No - Continue assessment No - Continue assessment     What is the highest level of mobility based on the progressive mobility assessment? Level 5 (Walks with assist in room/hall) - Balance while stepping forward/back and can walk in room with assist - Complete Level 5 (Walks with assist in room/hall) - Balance while stepping forward/back and can walk in room with assist - Complete Level 4 (Walks with assist in room) - Balance while marching in place and cannot step forward and back - Complete                DVT prophylaxis: Lovenox Code Status: Full Family Communication: 1/22 family at bedside for discussion of plan of care all questions answered Status is: Inpatient    Dispo: The patient is from: Home              Anticipated d/c is to: SNF              Anticipated d/c date is: 2 days              Patient currently is not medically stable to d/c.    Procedures/Significant Events:    Consultants:     Cultures   Antimicrobials: Anti-infectives (From  admission, onward)    Start     Dose/Rate Route Frequency Ordered Stop   03/23/22 1800  cefTRIAXone (ROCEPHIN) 1 g in sodium chloride 0.9 % 100 mL IVPB        1 g 200 mL/hr over 30 Minutes Intravenous Every 24 hours 03/22/22 2009 03/23/22 1821   03/19/22 1815  azithromycin (ZITHROMAX) tablet 500 mg        500 mg Oral Daily 03/19/22 1718 03/23/22 0925   03/19/22 1800  cefTRIAXone (ROCEPHIN) 1 g in sodium chloride 0.9 % 100 mL IVPB  Status:  Discontinued        1  g 200 mL/hr over 30 Minutes Intravenous Every 24 hours 03/19/22 1718 03/22/22 2009   03/16/22 1000  oseltamivir (TAMIFLU) capsule 30 mg        30 mg Oral Daily 03/15/22 2359 03/19/22 0849   03/15/22 2345  oseltamivir (TAMIFLU) capsule 75 mg  Status:  Discontinued        75 mg Oral 2 times daily 03/15/22 2334 03/15/22 2358         Assessment & Plan: Covid vaccination;   Principal Problem:   Influenza A Acute on chronic respiratory failure (At home she is between 2.5 to 3 lit [place])/positive influenza A/stage 4 adenocarcinoma of the lung/COPD exacerbation  -Finished treatment with  tamiflu,  - started on rocephin and zithromax on 1/19 due to lack of clinical improvement.  -CT chest obtained on 1/19 due to lack of clinical improvement showed "Emphysematous and bronchitic changes with scattered chronic opacities in the upper lobes, stable.   Minimal peribronchovascular infiltrate RIGHT middle lobe and lingula, slightly increased question related to bronchiolitis or atypical infection.   Stable subsolid opacities in RIGHT upper lobe and superior segment LEFT lower lobe.   3 mm posterior RIGHT upper lobe nodule not definitely seen previously.   Scattered atherosclerotic calcifications including coronary arteries.   No acute intrathoracic abnormalities.   Aortic Atherosclerosis (ICD10-I70.0) and Emphysema (ICD10-J43.9)."   -improved after started on abx, started to show improvement on 1/20, on 1/21 she still have cough but much less,wheezing has finally resolved,  much improved aeration, O2 requirement back to baseline with less dyspnea on exertion -continue abx to finish total of 5 days treatment, taper steroids to off  in 5 days - hold [place] as can cause pneumonitis, pneumonia, cough, dyspnea and elevated LFT -Patient meets criteria for home O2 -1/22 SATURATION QUALIFICATIONS: Patient Saturations on Room Air at Rest = 89% Patient Saturations on Room Air while Ambulating =  86% Patient Saturations on 2 Liters of oxygen while Ambulating = 93% Please briefly explain why patient needs home oxygen: Patient ambulated on room air and oxygen saturation is 87%. Patient is short of breath with exertion. Patient placed on oxygen on 2 liters and was able to recuperate oxygen saturation back to 93% on 2 liters.  -Patient meets criteria for home O2 - 2 L O2 titrate to maintain SpO2 89 to 95%   Steroid-induced hyperglycemia/new diagnosis of diabetes with hyperglycemia No prior diagnosis of diabetes Hemoglobin A1c at 6.6 -1/22 Prednisone 40 mg, will continue to taper, continue adjust insulin as needed CBG (last 3)  Recent Labs    03/23/22 1119 03/23/22 1705 03/23/22 2104  GLUCAP 143* 206* 158*  -Semglee 10 units BID -1/22 increase NovoLog 6 units qac -1/22 decrease sensitive SSI   Leukocytosis From stress , steroids? Lactic acid wnl.  WBC count is improving  blood cultures from 1/17  no growth  Taper steroid   Hyperkalemia:  Resolved with [place].    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  Tbili 0.9 less likely Hemolysis Does not appear volume overloaded -Anemia labs showed low normal B12 at 231,  started B12 supplement, iron panel not consistent with iron deficiency, stool is brown/clay color, FOBT + but reports hemorrhoids -Transfuse to keep hemoglobin<7.  Lab Results  Component Value Date   HGB 8.4 (L) 03/23/2022   HGB 8.4 (L) 03/22/2022   HGB 7.5 (L)  03/19/2022   HGB 7.6 (L) 03/17/2022   HGB 8.2 (L) 03/16/2022     Elevated Liver enzymes, appear transient  -Unclear etiology,  -Negative hepatitis panel, CK abnormally low ,  -Abdominal ultrasound "1. Hepatic steatosis. 2. Status post cholecystectomy." -[name] can cause hepatotoxicity as well -Lft still fluctuating , Monitor.    Hypertension:  -Metoprolol 12.5 mg  BID   Stage 4 adenocarcinoma  of the lung -Hold adagrasib due to side effect : pneumonitis, pneumonia, cough, dyspnea and elevated LFT which patient is having now -Follow up withDr Arbutus Ped.    Goals of care - PT recommends SNF and patient agreeable   Mobility Assessment (last 72 hours)     Mobility Assessment     Row Name 03/22/22 1300 03/21/22 1933 03/20/22 1023       Does patient have an order for bedrest or is patient medically unstable No - Continue assessment No - Continue assessment No - Continue assessment     What is the highest level of mobility based on the progressive mobility assessment? Level 5 (Walks with assist in room/hall) - Balance while stepping forward/back and can walk in room with assist - Complete Level 5 (Walks with assist in room/hall) - Balance while stepping forward/back and can walk in room with assist - Complete Level 4 (Walks with assist in room) - Balance while marching in place and cannot step forward and back - Complete                   Time: 50 minutes         Care during the described time interval was provided by me .  I have reviewed this patient's available data, including medical history, events of note, physical examination, and all test results as part of my evaluation.

## 2022-03-23 NOTE — NC FL2 (Signed)
[place] MEDICAID FL2 LEVEL OF CARE FORM     IDENTIFICATION  Patient Name: [name] Birthdate: 23-May-1955 Sex: female Admission Date (Current Location): 03/15/2022  [facility] [place] Number:  Herbalist and Address:  [facility],  [address] [place], [place]      Provider Number: [id number]  Attending Physician Name and Address:  Allie Bossier, MD  Relative Name and Phone Number:  Kyleigha, Markert [phone]    Current Level of Care: Hospital Recommended Level of Care: [place] Prior Approval Number:    Date Approved/Denied:   PASRR Number: [id number]  Discharge Plan: SNF    Current Diagnoses: Patient Active Problem List   Diagnosis Date Noted   Influenza A 03/15/2022   COPD (chronic obstructive pulmonary disease) ([place]) 01/27/2021   Chronic respiratory failure with hypoxia ([place]) 01/27/2021   Anemia associated with chemotherapy 10/07/2018   Thrombocytopenia ([place]) 10/07/2018   Acute pyelonephritis 10/06/2018   Acute lower UTI    Diarrhea 06/20/2018   Tardive dyskinesia 06/20/2018   Medication side effect 06/20/2018   Nausea and vomiting 05/11/2018   AKI (acute kidney injury) ([place]) 05/11/2018   Port-A-Cath in place 05/02/2018   Adenocarcinoma of left lung, stage 4 ([place]) 04/05/2018   Encounter for antineoplastic chemotherapy 04/05/2018   Encounter for antineoplastic immunotherapy 04/05/2018   Goals of care, counseling/discussion 04/05/2018   Tobacco abuse counseling 03/05/2018   PVC's (premature ventricular contractions)    SVT (supraventricular tachycardia)    Premature atrial contractions    Unstable angina ([place]) 12/07/2012   Tachycardia 02/19/2009   CAD, NATIVE VESSEL 06/04/2008   Hyperlipemia 05/29/2008   DEPRESSION 05/29/2008   Asthma 05/29/2008   GERD 05/29/2008   Irritable bowel syndrome 05/29/2008   Spondylosis 05/29/2008   FIBROMYALGIA 05/29/2008   CHEST PAIN-PRECORDIAL 05/07/2008     Orientation RESPIRATION BLADDER Height & Weight     Self, Situation, Place, Time  O2 Continent Weight: 175 lb (79.4 kg) Height:  5\' 7"  (170.2 cm)  BEHAVIORAL SYMPTOMS/MOOD NEUROLOGICAL BOWEL NUTRITION STATUS      Continent Diet (Regular)  AMBULATORY STATUS COMMUNICATION OF NEEDS Skin   Limited Assist Verbally Normal                       Personal Care Assistance Level of Assistance  Bathing, Feeding, Dressing Bathing Assistance: Limited assistance Feeding assistance: Independent Dressing Assistance: Limited assistance     Functional Limitations Info  Sight, Hearing, Speech Sight Info: Adequate Hearing Info: Adequate Speech Info: Adequate    SPECIAL CARE FACTORS FREQUENCY  PT (By licensed PT), OT (By licensed OT)     PT Frequency: 5x/wk OT Frequency: 5x/wk            Contractures Contractures Info: Not present    Additional Factors Info  Code Status, Allergies Code Status Info: FULL Allergies Info: Ciprofloxacin, Levofloxacin, Statins, Tricor (Fenofibrate), Compazine (Prochlorperazine), Latex, Codeine, Metoprolol           Current Medications (03/23/2022):  This is the current hospital active medication list Current Facility-Administered Medications  Medication Dose Route Frequency Provider Last Rate Last Admin   acetaminophen (TYLENOL) tablet 650 mg  650 mg Oral Q6H PRN Hugelmeyer, Alexis, DO   650 mg at 03/21/22 0950   Or   acetaminophen (TYLENOL) suppository 650 mg  650 mg Rectal Q6H PRN Hugelmeyer, Alexis, DO       albuterol (PROVENTIL) (2.5 MG/3ML) 0.083% nebulizer solution 2.5 mg  2.5  mg Nebulization Q6H PRN Hugelmeyer, Alexis, DO   2.5 mg at 03/21/22 0229   aspirin chewable tablet 81 mg  81 mg Oral Daily Hugelmeyer, Alexis, DO   81 mg at 03/23/22 7195   benzonatate (TESSALON) capsule 200 mg  200 mg Oral TID PRN Kathlen Mody, MD   200 mg at 03/21/22 2203   bisacodyl (DULCOLAX) EC tablet 5 mg  5 mg Oral Daily PRN Hugelmeyer, Alexis, DO        buPROPion (WELLBUTRIN SR) 12 hr tablet 150 mg  150 mg Oral Daily Danford Bad, RPH   150 mg at 03/23/22 9747   cefTRIAXone (ROCEPHIN) 1 g in sodium chloride 0.9 % 100 mL IVPB  1 g Intravenous Q24H Albertine Grates, MD       Chlorhexidine Gluconate Cloth 2 % PADS 6 each  6 each Topical Daily Kathlen Mody, MD   6 each at 03/22/22 1027   cyanocobalamin (VITAMIN B12) tablet 1,000 mcg  1,000 mcg Oral Daily Albertine Grates, MD   1,000 mcg at 03/23/22 0924   enoxaparin (LOVENOX) injection 40 mg  40 mg Subcutaneous Q24H Norva Pavlov, RPH   40 mg at 03/23/22 1855   famotidine (PEPCID) tablet 10 mg  10 mg Oral Meribeth Mattes, MD   10 mg at 03/22/22 2137   fluticasone furoate-vilanterol (BREO ELLIPTA) 100-25 MCG/ACT 1 puff  1 puff Inhalation Daily Shade, Jacqulyn Cane, RPH   1 puff at 03/23/22 0818   And   umeclidinium bromide (INCRUSE ELLIPTA) 62.5 MCG/ACT 1 puff  1 puff Inhalation Daily Shade, Christine E, RPH   1 puff at 03/23/22 0818   guaiFENesin (MUCINEX) 12 hr tablet 600 mg  600 mg Oral BID Albertine Grates, MD   600 mg at 03/23/22 0158   hydrALAZINE (APRESOLINE) injection 10 mg  10 mg Intravenous Q6H PRN Hugelmeyer, Alexis, DO       HYDROcodone bit-homatropine (HYCODAN) 5-1.5 MG/5ML syrup 5 mL  5 mL Oral Q4H PRN Kathlen Mody, MD   5 mL at 03/23/22 0927   insulin aspart (novoLOG) injection 0-15 Units  0-15 Units Subcutaneous TID WC Kathlen Mody, MD   5 Units at 03/22/22 1812   insulin aspart (novoLOG) injection 0-5 Units  0-5 Units Subcutaneous QHS Kathlen Mody, MD   3 Units at 03/22/22 2137   insulin aspart (novoLOG) injection 3 Units  3 Units Subcutaneous TID WC Albertine Grates, MD   3 Units at 03/22/22 1818   insulin glargine-yfgn (SEMGLEE) injection 10 Units  10 Units Subcutaneous BID Albertine Grates, MD   10 Units at 03/23/22 1052   lip balm (CARMEX) ointment   Topical PRN Albertine Grates, MD   75 Application at 03/21/22 1227   LORazepam (ATIVAN) tablet 2 mg  2 mg Oral QPM Hugelmeyer, Alexis, DO   2 mg at 03/22/22 1818    metoCLOPramide (REGLAN) injection 5 mg  5 mg Intravenous Q6H PRN Albertine Grates, MD   5 mg at 03/23/22 6825   metoprolol tartrate (LOPRESSOR) tablet 12.5 mg  12.5 mg Oral BID Hugelmeyer, Alexis, DO   12.5 mg at 03/23/22 0924   nitroGLYCERIN (NITROSTAT) SL tablet 0.4 mg  0.4 mg Sublingual Q5 min PRN Hugelmeyer, Alexis, DO   0.4 mg at 03/17/22 0317   ondansetron (ZOFRAN) injection 4 mg  4 mg Intravenous Q6H PRN Prosperi, Christian H, PA-C   4 mg at 03/23/22 0635   ondansetron (ZOFRAN) tablet 4 mg  4 mg Oral Q6H PRN Hugelmeyer, Alexis, DO  4 mg at 03/21/22 1647   Or   ondansetron (ZOFRAN) injection 4 mg  4 mg Intravenous Q6H PRN Hugelmeyer, Alexis, DO   4 mg at 03/21/22 0329   phenol (CHLORASEPTIC) mouth spray 1 spray  1 spray Mouth/Throat PRN Albertine Grates, MD   1 spray at 03/21/22 1645   predniSONE (DELTASONE) tablet 40 mg  40 mg Oral Q breakfast Albertine Grates, MD   40 mg at 03/23/22 3041   saccharomyces boulardii (FLORASTOR) capsule 250 mg  250 mg Oral BID Albertine Grates, MD   250 mg at 03/23/22 0277   senna-docusate (Senokot-S) tablet 1 tablet  1 tablet Oral QHS PRN Hugelmeyer, Alexis, DO       sodium chloride flush (NS) 0.9 % injection 10-40 mL  10-40 mL Intracatheter Q12H Kathlen Mody, MD   10 mL at 03/23/22 0924   sodium chloride flush (NS) 0.9 % injection 10-40 mL  10-40 mL Intracatheter PRN Kathlen Mody, MD       sodium chloride flush (NS) 0.9 % injection 3 mL  3 mL Intravenous Q12H Hugelmeyer, Alexis, DO   3 mL at 03/23/22 6013     Discharge Medications: Please see discharge summary for a list of discharge medications.  Relevant Imaging Results:  Relevant Lab Results:   Additional Information SSN: [id number]  Amada Jupiter, LCSW

## 2022-03-24 DIAGNOSIS — E119 Type 2 diabetes mellitus without complications: Secondary | ICD-10-CM

## 2022-03-24 DIAGNOSIS — D72829 Elevated white blood cell count, unspecified: Secondary | ICD-10-CM | POA: Diagnosis present

## 2022-03-24 DIAGNOSIS — J9621 Acute and chronic respiratory failure with hypoxia: Secondary | ICD-10-CM | POA: Diagnosis present

## 2022-03-24 DIAGNOSIS — R748 Abnormal levels of other serum enzymes: Secondary | ICD-10-CM | POA: Diagnosis present

## 2022-03-24 DIAGNOSIS — B029 Zoster without complications: Secondary | ICD-10-CM | POA: Diagnosis not present

## 2022-03-24 DIAGNOSIS — J441 Chronic obstructive pulmonary disease with (acute) exacerbation: Secondary | ICD-10-CM | POA: Diagnosis present

## 2022-03-24 DIAGNOSIS — D638 Anemia in other chronic diseases classified elsewhere: Secondary | ICD-10-CM | POA: Diagnosis present

## 2022-03-24 DIAGNOSIS — C7802 Secondary malignant neoplasm of left lung: Secondary | ICD-10-CM

## 2022-03-24 DIAGNOSIS — J09X1 Influenza due to identified novel influenza A virus with pneumonia: Secondary | ICD-10-CM | POA: Diagnosis present

## 2022-03-24 DIAGNOSIS — N1832 Chronic kidney disease, stage 3b: Secondary | ICD-10-CM

## 2022-03-24 DIAGNOSIS — N179 Acute kidney failure, unspecified: Secondary | ICD-10-CM | POA: Diagnosis present

## 2022-03-24 DIAGNOSIS — R0602 Shortness of breath: Secondary | ICD-10-CM | POA: Diagnosis not present

## 2022-03-24 DIAGNOSIS — C7801 Secondary malignant neoplasm of right lung: Secondary | ICD-10-CM | POA: Diagnosis present

## 2022-03-24 DIAGNOSIS — I10 Essential (primary) hypertension: Secondary | ICD-10-CM | POA: Diagnosis present

## 2022-03-24 DIAGNOSIS — J101 Influenza due to other identified influenza virus with other respiratory manifestations: Secondary | ICD-10-CM | POA: Diagnosis not present

## 2022-03-24 LAB — COMPREHENSIVE METABOLIC PANEL
ALT: 148 U/L — ABNORMAL HIGH (ref 0–44)
AST: 39 U/L (ref 15–41)
Albumin: 2.7 g/dL — ABNORMAL LOW (ref 3.5–5.0)
Alkaline Phosphatase: 65 U/L (ref 38–126)
Anion gap: 4 — ABNORMAL LOW (ref 5–15)
BUN: 39 mg/dL — ABNORMAL HIGH (ref 8–23)
CO2: 24 mmol/L (ref 22–32)
Calcium: 7.6 mg/dL — ABNORMAL LOW (ref 8.9–10.3)
Chloride: 106 mmol/L (ref 98–111)
Creatinine, Ser: 1.31 mg/dL — ABNORMAL HIGH (ref 0.44–1.00)
GFR, Estimated: 45 mL/min — ABNORMAL LOW (ref >60)
Glucose, Bld: 148 mg/dL — ABNORMAL HIGH (ref 70–99)
Potassium: 3.9 mmol/L (ref 3.5–5.1)
Sodium: 134 mmol/L — ABNORMAL LOW (ref 135–145)
Total Bilirubin: 0.4 mg/dL (ref 0.3–1.2)
Total Protein: 5.3 g/dL — ABNORMAL LOW (ref 6.5–8.1)

## 2022-03-24 LAB — CBC WITH DIFFERENTIAL/PLATELET
Abs Immature Granulocytes: 0.25 10*3/uL — ABNORMAL HIGH (ref 0.00–0.07)
Basophils Absolute: 0 10*3/uL (ref 0.0–0.1)
Basophils Relative: 0 %
Eosinophils Absolute: 0.2 10*3/uL (ref 0.0–0.5)
Eosinophils Relative: 2 %
HCT: 26.8 % — ABNORMAL LOW (ref 36.0–46.0)
Hemoglobin: 8.5 g/dL — ABNORMAL LOW (ref 12.0–15.0)
Immature Granulocytes: 3 %
Lymphocytes Relative: 18 %
Lymphs Abs: 1.7 10*3/uL (ref 0.7–4.0)
MCH: 30.5 pg (ref 26.0–34.0)
MCHC: 31.7 g/dL (ref 30.0–36.0)
MCV: 96.1 fL (ref 80.0–100.0)
Monocytes Absolute: 0.7 10*3/uL (ref 0.1–1.0)
Monocytes Relative: 7 %
Neutro Abs: 6.6 10*3/uL (ref 1.7–7.7)
Neutrophils Relative %: 70 %
Platelets: 156 10*3/uL (ref 150–400)
RBC: 2.79 MIL/uL — ABNORMAL LOW (ref 3.87–5.11)
RDW: 15 % (ref 11.5–15.5)
WBC: 9.4 10*3/uL (ref 4.0–10.5)
nRBC: 0 % (ref 0.0–0.2)

## 2022-03-24 LAB — GLUCOSE, CAPILLARY
Glucose-Capillary: 103 mg/dL — ABNORMAL HIGH (ref 70–99)
Glucose-Capillary: 114 mg/dL — ABNORMAL HIGH (ref 70–99)
Glucose-Capillary: 129 mg/dL — ABNORMAL HIGH (ref 70–99)
Glucose-Capillary: 148 mg/dL — ABNORMAL HIGH (ref 70–99)
Glucose-Capillary: 192 mg/dL — ABNORMAL HIGH (ref 70–99)
Glucose-Capillary: 246 mg/dL — ABNORMAL HIGH (ref 70–99)
Glucose-Capillary: 98 mg/dL (ref 70–99)

## 2022-03-24 LAB — PHOSPHORUS: Phosphorus: 3 mg/dL (ref 2.5–4.6)

## 2022-03-24 LAB — MAGNESIUM: Magnesium: 2.3 mg/dL (ref 1.7–2.4)

## 2022-03-24 MED ORDER — VALACYCLOVIR HCL 500 MG PO TABS
1000.0000 mg | ORAL_TABLET | Freq: Two times a day (BID) | ORAL | Status: DC
  Administered 2022-03-25 – 2022-03-31 (×13): 1000 mg via ORAL
  Filled 2022-03-24 (×14): qty 2

## 2022-03-24 MED ORDER — CALCIUM GLUCONATE-NACL 2-0.675 GM/100ML-% IV SOLN
2.0000 g | Freq: Once | INTRAVENOUS | Status: AC
  Administered 2022-03-24: 2000 mg via INTRAVENOUS
  Filled 2022-03-24: qty 100

## 2022-03-24 NOTE — Progress Notes (Addendum)
[name] MHW:[id number] DOB: 07-30-1955 DOA: 03/15/2022 PCP: [name], NP   Subj: [name] is a 67 y.o. 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  Her symptoms started about 5 days ago.  She was seen by her oncologist who prescribed doxycycline however her symptoms progressively worsened . patient had been off of chemotherapy for about 2 years until approximately 1 month ago when she started a new oral chemotherapy.  Patient was admitted for further management of influenza A, AKI likely secondary to dehydration.    Obj: 1/23 febrile overnight A/O x 4, positive SOB positive new onset butt pain/itching    Objective: VITAL SIGNS: Temp: 98.6 F (37 C) (01/23 1210) Temp Source: Oral (01/23 1210) BP: 108/58 (01/23 1210) Pulse Rate: 74 (01/23 1210)   VENTILATOR SETTINGS: Nasal cannula 1/23 Flow 2 L/min SpO2 100%   Intake/Output Summary (Last 24 hours) at 03/24/2022 1837 Last data filed at 03/24/2022 1701 Gross per 24 hour  Intake 590 ml  Output --  Net 590 ml    Physical Exam:  General: A/O x 4, positive chronic respiratory distress (baseline) Eyes: negative scleral hemorrhage, negative anisocoria, negative icterus ENT: Negative Runny nose, negative gingival bleeding, Neck:  Negative scars, masses, torticollis, lymphadenopathy, JVD Lungs: diffuse poor air movement bilaterally, diffuse expiratory wheeze, negative crackles  Cardiovascular: Regular rate and rhythm without murmur gallop or rub normal S1 and S2 Abdomen: negative abdominal pain, nondistended, positive soft, bowel sounds, no rebound, no ascites, no appreciable mass Extremities: No significant cyanosis, clubbing, or edema bilateral lower extremities Skin: Right buttocks dermatomes  S3-S5 blistering  rash Psychiatric:  Negative depression, negative anxiety, negative fatigue, negative mania  Central nervous system:  Cranial nerves II through XII intact, tongue/uvula midline, all extremities muscle strength 5/5, sensation intact throughout, finger nose finger bilateral within normal limits, quick finger touch bilateral within normal limits, negative Romberg sign, heel to shin bilateral within normal limits, standing on 1 foot bilateral within normal limits, walking on tiptoes within normal limits, walking on heels within normal limits, negative dysarthria, negative expressive aphasia, negative receptive aphasia.     .     Mobility Assessment (last 72 hours)     Mobility Assessment     Row Name 03/24/22 0900 03/23/22 1955 03/23/22 1515 03/23/22 0900 03/22/22 1300   Does patient have an order for bedrest or is patient medically unstable No - Continue assessment No - Continue assessment -- No - Continue assessment No - Continue assessment   What is the highest level of mobility based on the progressive mobility assessment? Level 4 (Walks with assist in room) - Balance while marching in place and cannot step forward and back - Complete Level 4 (Walks with assist in room) - Balance while marching in place and cannot step forward and back - Complete Level 4 (Walks with assist in room) - Balance while marching in place and cannot step forward and back - Complete Level 5 (Walks with assist in room/hall) - Balance while stepping forward/back and can walk in room with assist - Complete Level 5 (Walks with assist in room/hall) - Balance while stepping forward/back and can walk in room with assist - Complete    Row Name 03/21/22 1933           Does  patient have an order for bedrest or is patient medically unstable No - Continue assessment       What is the highest level of mobility based on the progressive mobility assessment? Level 5 (Walks with assist in room/hall) - Balance while  stepping forward/back and can walk in room with assist - Complete                  DVT prophylaxis: Lovenox Code Status: Full Family Communication: 1/22 family at bedside for discussion of plan of care all questions answered Status is: Inpatient    Dispo: The patient is from: Home              Anticipated d/c is to: SNF              Anticipated d/c date is: 2 days              Patient currently is not medically stable to d/c.    Procedures/Significant Events: CT chest obtained on 1/19 due to lack of clinical improvement showed "Emphysematous and bronchitic changes with scattered chronic opacities in the upper lobes, stable.   Minimal peribronchovascular infiltrate RIGHT middle lobe and lingula, slightly increased question related to bronchiolitis or atypical infection.   Stable subsolid opacities in RIGHT upper lobe and superior segment LEFT lower lobe.   3 mm posterior RIGHT upper lobe nodule not definitely seen previously.   Scattered atherosclerotic calcifications including coronary arteries.   No acute intrathoracic abnormalities.   Aortic Atherosclerosis (ICD10-I70.0) and Emphysema (ICD10-J43.9)."   Consultants:     Cultures   Antimicrobials: Anti-infectives (From admission, onward)    Start     Ordered Stop   03/24/22 2200  valACYclovir (VALTREX) tablet 1,000 mg        03/24/22 1736     03/23/22 1800  cefTRIAXone (ROCEPHIN) 1 g in sodium chloride 0.9 % 100 mL IVPB        03/22/22 2009 03/23/22 1821   03/19/22 1815  azithromycin (ZITHROMAX) tablet 500 mg        03/19/22 1718 03/23/22 0925   03/19/22 1800  cefTRIAXone (ROCEPHIN) 1 g in sodium chloride 0.9 % 100 mL IVPB  Status:  Discontinued        03/19/22 1718 03/22/22 2009   03/16/22 1000  oseltamivir (TAMIFLU) capsule 30 mg        03/15/22 2359 03/19/22 0849   03/15/22 2345  oseltamivir (TAMIFLU) capsule 75 mg  Status:  Discontinued        03/15/22 2334 03/15/22 2358          Assessment & Plan: Covid vaccination;   Principal Problem:   Influenza A Active Problems:   Shingles   Acute on chronic respiratory failure with hypoxia (HCC)   Influenza A with pneumonia   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 (HCC)   COPD with acute exacerbation (HCC)   New onset type 2 diabetes mellitus (HCC)   Leukocytosis   Acute renal failure superimposed on stage 3b chronic kidney disease (HCC)   Anemia of chronic disease   Elevated liver enzymes   Essential hypertension Acute on chronic respiratory failure (At home she is between 2.5 to 3 lit )/positive influenza A/stage 4 adenocarcinoma of the lung/COPD exacerbation  -Finished treatment with  tamiflu,  - started on rocephin and zithromax on 1/19 due to lack of clinical improvement.  -   -improved after started on abx, started to show improvement on 1/20, on 1/21 she still have cough but  much less,wheezing has finally resolved,  much improved aeration, O2 requirement back to baseline with less dyspnea on exertion -continue abx to finish total of 5 days treatment, taper steroids to off  in 5 days - hold [place] as can cause pneumonitis, pneumonia, cough, dyspnea and elevated LFT -Patient meets criteria for home O2 -1/22 SATURATION QUALIFICATIONS: Patient Saturations on Room Air at Rest = 89% Patient Saturations on Room Air while Ambulating = 86% Patient Saturations on 2 Liters of oxygen while Ambulating = 93% Please briefly explain why patient needs home oxygen: Patient ambulated on room air and oxygen saturation is 87%. Patient is short of breath with exertion. Patient placed on oxygen on 2 liters and was able to recuperate oxygen saturation back to 93% on 2 liters.  -Patient meets criteria for home O2 - 2 L O2 titrate to maintain SpO2 89 to 95%  Shingles -Right buttocks dermatomes S3-S5 blistering  rash -Valacyclovir 1000mg  TID x 7 days -Varicella-Zoster by PCR Pending   Steroid-induced hyperglycemia/new  diagnosis of diabetes with hyperglycemia -No prior diagnosis of diabetes -1/15 Hemoglobin A1c= 6.6 -1/22 Prednisone 40 mg, will continue to taper, continue adjust insulin as needed CBG (last 3)  Recent Labs    03/24/22 0750 03/24/22 1133 03/24/22 1530  GLUCAP 114* 103* 246*  -Semglee 10 units BID -1/22 increase NovoLog 6 units qac -1/22 decrease sensitive SSI   Leukocytosis From stress , steroids? -Lactic acid wnl.  WBC count is improving -1/17 blood cultures NGTD Taper steroid -1/23 resolved   Hyperkalemia:  -Resolved with Lokelma.    Hyponatremia -Slightly low asymptomatic    Hypocalcemia - Calcium goal> 8.9 - 1/23 corrected calcium 8.6 - 1/23 calcium gluconate IV 2 g   Acute Kidney injury on CKDIIIb (baseline Cr 1.4-2):  Urine culture no growth, suspect aki due to pre renal azotemia -Admitted with a creatinine of 2.3  ,  -Off IV fluids , encourage oral intake Lab Results  Component Value Date   CREATININE 1.31 (H) 03/24/2022   CREATININE 1.28 (H) 03/23/2022   CREATININE 1.53 (H) 03/22/2022   CREATININE 1.49 (H) 03/21/2022   CREATININE 1.53 (H) 03/20/2022  -At baseline    Anemia of chronic disease: (Baseline~9) -NO obvious bleeding seen.  Tbili 0.9 less likely Hemolysis Does not appear volume overloaded -Anemia labs showed low normal B12 at 231,  started B12 supplement, iron panel not consistent with iron deficiency, stool is brown/clay color, FOBT + but reports hemorrhoids -Transfuse to keep hemoglobin<7.  Lab Results  Component Value Date   HGB 8.5 (L) 03/24/2022   HGB 8.4 (L) 03/23/2022   HGB 8.4 (L) 03/22/2022   HGB 7.5 (L) 03/19/2022   HGB 7.6 (L) 03/17/2022  -1/23 no signs of active bleeding   Elevated Liver enzymes, appear transient  -Unclear etiology,  -Negative hepatitis panel, CK abnormally low ,  -Abdominal ultrasound "1. Hepatic steatosis. 2. Status post cholecystectomy." -Alfred Levins can cause hepatotoxicity as well -Lft still fluctuating  , Monitor.    Hypertension:  -Metoprolol 12.5 mg  BID   Stage 4 adenocarcinoma  of the lung -Hold adagrasib due to side effect : pneumonitis, pneumonia, cough, dyspnea and elevated LFT which patient is having now -Follow up [place].    Goals of care - PT recommends SNF and patient agreeable   Mobility Assessment (last 72 hours)     Mobility Assessment     Row Name 03/24/22 0900 03/23/22 1955 03/23/22 1515 03/23/22 0900 03/22/22 1300   Does patient have an order  for bedrest or is patient medically unstable No - Continue assessment No - Continue assessment -- No - Continue assessment No - Continue assessment   What is the highest level of mobility based on the progressive mobility assessment? Level 4 (Walks with assist in room) - Balance while marching in place and cannot step forward and back - Complete Level 4 (Walks with assist in room) - Balance while marching in place and cannot step forward and back - Complete Level 4 (Walks with assist in room) - Balance while marching in place and cannot step forward and back - Complete Level 5 (Walks with assist in room/hall) - Balance while stepping forward/back and can walk in room with assist - Complete Level 5 (Walks with assist in room/hall) - Balance while stepping forward/back and can walk in room with assist - Complete    Row Name 03/21/22 1933           Does patient have an order for bedrest or is patient medically unstable No - Continue assessment       What is the highest level of mobility based on the progressive mobility assessment? Level 5 (Walks with assist in room/hall) - Balance while stepping forward/back and can walk in room with assist - Complete                     Time: 50 minutes         Care during the described time interval was provided by me .  I have reviewed this patient's available data, including medical history, events of note, physical examination, and all test results as part of my  evaluation.

## 2022-03-24 NOTE — Progress Notes (Signed)
PHARMACY NOTE:  ANTIMICROBIAL RENAL DOSAGE ADJUSTMENT  Current antimicrobial regimen includes a mismatch between antimicrobial dosage and estimated renal function.  As per policy approved by the Pharmacy & Therapeutics and Medical Executive Committees, the antimicrobial dosage will be adjusted accordingly.  Current antimicrobial dosage:  Valtrex 1gm TID  Indication: shingles  Renal Function:  Estimated Creatinine Clearance: 45.2 mL/min (A) (by C-G formula based on SCr of 1.31 mg/dL (H)). []      On intermittent HD, scheduled: []      On CRRT    Antimicrobial dosage has been changed to:  Valtrex 1gm BID  Additional comments:   Thank you for allowing pharmacy to be a part of this patient's care.  Netta Cedars, [place] 03/24/2022 6:00 PM

## 2022-03-24 NOTE — TOC Progression Note (Addendum)
Transition of Care [facility]) - Progression Note    Patient Details  Name: [name] MRN: [id number] Date of Birth: [birth year]  Transition of Care [facility]) CM/SW Contact  Branch Pacitti, Olegario Messier, RN Phone Number: 03/24/2022, 10:42 AM  Clinical Narrative: Spoke to spouse about d/c plans-agree to ST SNF-has bed offers;primary insurance [employer]-will need auth by facility chosen. Await choice.    1. 1.5 mi [facility] [address] [place], [place] [address] [phone] Overall rating Much below average 2. 1.9 mi [facility] [address] [place], [place] [address] [phone] Overall rating Average 3. 2 mi [facility] [address] [place], [place] [address] [phone] Overall rating Much below average 4. 2.4 mi [facility] at the [facility] [address] [place], [place] [address] [phone] Overall rating Above average 5. 2.6 mi [facility] [address] [place], [place] [address] [phone] Overall rating Much below average 6. 3.4 mi [facility] and [facility] [address] [place], [place] [address] [phone] Overall rating Much below average 7. 3.7 mi [facility] [employer] [address] [place], [place] [address] [phone] Overall rating Much above average 8. 3.9 mi [facility] [address] [place], [place] [address] [phone] Overall rating Above average 9. 4 mi [facility] [address] [place], [place] [address] [phone] Overall rating Below average 10. 4.2 mi [facility] [address] [place], [place] [address] [phone] Overall rating Below average 11. 4.6 mi [facility] [address] [place], [place]  [address] [phone] Overall rating Much below average 12. 6.3 mi [facility] [address] [place], [place] [address] [phone] Overall rating Above average 13. 9.2 mi [facility] [address] [place], [place] [address] [phone] Overall rating Below average 14. 9.6 mi [facility] [address] [place], [place] [address] [phone] Overall rating Much above average 15. 9.8 mi [facility] [address] [place], [place] [address] [phone] Overall rating Above average 16. 9.9 mi [facility] [address] [place], [place] [address] [phone] Overall rating Much above average 17. 10.7 mi [facility] [address] [place], [place] [address] [phone] Overall rating Much above average 18. 13.3 [facility] [address] [place], [place] [address] [phone] Overall rating Much below average 19. 13.6 mi [facility] [address] [place], [place] [address] [phone] Overall rating Much below average 20. 14.2 mi [facility] [address] [place], [place] [address] [phone] Overall rating Much above average 21. 14.4 mi [facility] [address] [place], [place] [address] [phone] Overall rating Much below average 22. 15 mi [facility] [address] [place], [place] [address] [phone] Overall rating Above average 23. 15.2 mi [facility] [address] [place], [place] [address] [phone] Overall rating Average 24. 15.3 mi [facility] [employer] [facility] [address] [place], [place] [address] [phone] Overall rating Average 25. 16.9 mi [facility] [address] [place], [place] [address] [phone] Overall rating Much above average 26. 18.1 mi [employer] [facility] [employer] [facility] [place] [address] [place], [place] [address] [phone] Overall rating Average 27. 18.9 mi [facility] [address] [place], [place] [address] [phone] Overall rating Below average 28. 19.7 mi [facility] and [facility] [address] [place], [place] [address] [phone] Overall rating Below average 29. 20.2 mi [address] at the [facility] [place], [place] [address] [phone] Overall rating Much above average 30. 20.4 mi [facility] and [facility] [address] [place], [place] [address] [phone] Overall rating Much below average 31. 20.4 mi [facility] [address] [place], [place] [address] [phone] Overall rating Much below average 32. 20.6 [facility] [address] [place], [place] [address] [phone] Overall rating Much above average 33. 21.2 [address] [place], [place] [address] [phone] Overall rating Average 34. 22.7 mi [facility] [address] [place], [place] [address] [phone] Overall rating Below average 35. 22.8 mi [employer] [address] [place], [place] [address] [phone] Overall rating Much above average 36. 23.1 mi [facility] and [facility] [address] [place], [place] [address] [phone] Overall rating Average 37. 23.5 mi [facility] [address] [place], [place] [address] [phone] Overall rating Average 38. 23.7 [facility] [address] [place], [place] [address] [phone] Overall rating Not available18 39. 24 mi [facility] [address] [place], [place] [address] [phone] Overall rating Much below average 40. 24.8 mi [facility] [employer] [address] [place], [place] [address] [phone] Overall rating Above average To explore and download nursing home data,visit the    Expected Discharge Plan: Skilled Nursing Facility Barriers to Discharge: Continued Medical Work up  Expected Discharge Plan and Services In-house Referral: Clinical Social Work Discharge Planning Services: CM Consult Post Acute Care Choice: Skilled Nursing Facility Living arrangements for the past 2 months: Single Family Home                 DME Arranged: N/A DME Agency: NA                   Social Determinants of Health (SDOH) Interventions SDOH Screenings   Food Insecurity: No Food Insecurity (03/16/2022)  Housing: Low Risk  (03/16/2022)  Transportation Needs: No Transportation Needs (03/16/2022)  Utilities: Not At Risk (03/16/2022)  Tobacco Use: Medium Risk (03/15/2022)    Readmission Risk Interventions    03/19/2022   12:16 PM 03/19/2022   11:41 AM  Readmission Risk Prevention Plan  Transportation Screening Complete Complete  PCP or Specialist Appt within 5-7 Days  Complete  Home Care Screening  Complete  Medication Review (RN CM)  Complete

## 2022-03-25 DIAGNOSIS — B029 Zoster without complications: Secondary | ICD-10-CM | POA: Diagnosis not present

## 2022-03-25 DIAGNOSIS — J101 Influenza due to other identified influenza virus with other respiratory manifestations: Secondary | ICD-10-CM | POA: Diagnosis not present

## 2022-03-25 LAB — CBC WITH DIFFERENTIAL/PLATELET
Abs Immature Granulocytes: 0.45 10*3/uL — ABNORMAL HIGH (ref 0.00–0.07)
Basophils Absolute: 0 10*3/uL (ref 0.0–0.1)
Basophils Relative: 0 %
Eosinophils Absolute: 0.2 10*3/uL (ref 0.0–0.5)
Eosinophils Relative: 2 %
HCT: 29.7 % — ABNORMAL LOW (ref 36.0–46.0)
Hemoglobin: 9.5 g/dL — ABNORMAL LOW (ref 12.0–15.0)
Immature Granulocytes: 4 %
Lymphocytes Relative: 20 %
Lymphs Abs: 2.3 10*3/uL (ref 0.7–4.0)
MCH: 30.6 pg (ref 26.0–34.0)
MCHC: 32 g/dL (ref 30.0–36.0)
MCV: 95.8 fL (ref 80.0–100.0)
Monocytes Absolute: 0.9 10*3/uL (ref 0.1–1.0)
Monocytes Relative: 8 %
Neutro Abs: 7.8 10*3/uL — ABNORMAL HIGH (ref 1.7–7.7)
Neutrophils Relative %: 66 %
Platelets: 166 10*3/uL (ref 150–400)
RBC: 3.1 MIL/uL — ABNORMAL LOW (ref 3.87–5.11)
RDW: 15.1 % (ref 11.5–15.5)
WBC: 11.7 10*3/uL — ABNORMAL HIGH (ref 4.0–10.5)
nRBC: 0 % (ref 0.0–0.2)

## 2022-03-25 LAB — COMPREHENSIVE METABOLIC PANEL
ALT: 156 U/L — ABNORMAL HIGH (ref 0–44)
AST: 37 U/L (ref 15–41)
Albumin: 2.9 g/dL — ABNORMAL LOW (ref 3.5–5.0)
Alkaline Phosphatase: 62 U/L (ref 38–126)
Anion gap: 8 (ref 5–15)
BUN: 28 mg/dL — ABNORMAL HIGH (ref 8–23)
CO2: 25 mmol/L (ref 22–32)
Calcium: 8.7 mg/dL — ABNORMAL LOW (ref 8.9–10.3)
Chloride: 105 mmol/L (ref 98–111)
Creatinine, Ser: 1.36 mg/dL — ABNORMAL HIGH (ref 0.44–1.00)
GFR, Estimated: 43 mL/min — ABNORMAL LOW (ref >60)
Glucose, Bld: 93 mg/dL (ref 70–99)
Potassium: 4 mmol/L (ref 3.5–5.1)
Sodium: 138 mmol/L (ref 135–145)
Total Bilirubin: 0.7 mg/dL (ref 0.3–1.2)
Total Protein: 5.6 g/dL — ABNORMAL LOW (ref 6.5–8.1)

## 2022-03-25 LAB — GLUCOSE, CAPILLARY
Glucose-Capillary: 99 mg/dL (ref 70–99)
Glucose-Capillary: 81 mg/dL (ref 70–99)
Glucose-Capillary: 176 mg/dL — ABNORMAL HIGH (ref 70–99)
Glucose-Capillary: 230 mg/dL — ABNORMAL HIGH (ref 70–99)
Glucose-Capillary: 271 mg/dL — ABNORMAL HIGH (ref 70–99)

## 2022-03-25 LAB — PHOSPHORUS: Phosphorus: 3 mg/dL (ref 2.5–4.6)

## 2022-03-25 LAB — MAGNESIUM: Magnesium: 2.1 mg/dL (ref 1.7–2.4)

## 2022-03-25 NOTE — Progress Notes (Signed)
Physical Therapy Treatment Patient Details Name: [name] MRN: [id number] DOB: [birth year] Today's Date: 03/25/2022   History of Present Illness [name] is a 67 y.o. female admitted for further management of influenza A, AKI likely secondary to dehydration. PMH: stage IV adenocarcinoma of the left lung on 3 to 4 L at home, COPD, coronary artery disease, asthma, depression, fibromyalgia, GERD, HTN, hyperlipidemia, hypothyroidism. New dx of shingles 03/24/22.     PT Comments     Noted new dx of shingles. Pt ambulated 40' with RW and 3L O2, SpO2 99%, distance limited by fatigue. Pt reports feeling weak, and h/o multiple falls at home "due to MS". Instructed pt in seated BUE/LE strengthening exercises and encouraged her to do these 2-3x/day. Pt reports poor tolerance to sitting in recliner 2* sacral pain, which she stated is due to shingles.    Recommendations for follow up therapy are one component of a multi-disciplinary discharge planning process, led by the attending physician.  Recommendations may be updated based on patient status, additional functional criteria and insurance authorization.  Follow Up Recommendations  Skilled nursing-short term rehab (<3 hours/day) Can patient physically be transported by private vehicle: Yes   Assistance Recommended at Discharge Set up Supervision/Assistance  Patient can return home with the following A little help with walking and/or transfers;A little help with bathing/dressing/bathroom;Assist for transportation;Help with stairs or ramp for entrance;Assistance with cooking/housework   Equipment Recommendations  None recommended by PT    Recommendations for Other Services       Precautions / Restrictions Precautions Precautions: Fall Precaution Comments: suplemental O2 baseline, reports h/o multiple falls at baseline ("due to MS") Restrictions Weight Bearing Restrictions: No     Mobility  Bed Mobility Overal bed mobility:  Independent Bed Mobility: Sit to Supine       Sit to supine: Independent        Transfers Overall transfer level: Needs assistance Equipment used: None Transfers: Sit to/from Stand Sit to Stand: Supervision           General transfer comment: supervision for safety 2* h/o falls with sit to stand from toilet    Ambulation/Gait Ambulation/Gait assistance: Min guard Gait Distance (Feet): 40 Feet Assistive device: Rolling walker (2 wheels) Gait Pattern/deviations: Decreased stride length       General Gait Details: mildly unsteady when pt let go of RW to take a few steps to toilet, min/guard safety, pt reports h/o multiple falls 2* MS, ambulated with 3L O2, SpO2 99%, distance limited by fatigue (pt stated, "I feel weak")   Stairs             Wheelchair Mobility    Modified Rankin (Stroke Patients Only)       Balance Overall balance assessment: Needs assistance Sitting-balance support: Feet supported Sitting balance-Leahy Scale: Good     Standing balance support: During functional activity, Bilateral upper extremity supported Standing balance-Leahy Scale: Fair                              Cognition Arousal/Alertness: Awake/alert Behavior During Therapy: WFL for tasks assessed/performed Overall Cognitive Status: Within Functional Limits for tasks assessed                                          Exercises General Exercises - Upper Extremity Shoulder Flexion: AROM, Both, 10  reps, Seated General Exercises - Lower Extremity Ankle Circles/Pumps: AROM, Both, 15 reps, Seated Long Arc Quad: AROM, Both, 10 reps, Seated Hip Flexion/Marching: AROM, Both, 10 reps, Seated    General Comments        Pertinent Vitals/Pain Pain Assessment Pain Score: 6  Pain Location: sacral area (pt reports this is shingles pain) Pain Descriptors / Indicators: Sore Pain Intervention(s): Limited activity within patient's tolerance, Monitored  during session, Premedicated before session    Home Living                          Prior Function            PT Goals (current goals can now be found in the care plan section) Acute Rehab PT Goals Patient Stated Goal: to get stronger PT Goal Formulation: With patient Time For Goal Achievement: 04/01/22 Potential to Achieve Goals: Good Progress towards PT goals: Progressing toward goals    Frequency    Min 2X/week      PT Plan Discharge plan needs to be updated    Co-evaluation              AM-PAC PT "6 Clicks" Mobility   Outcome Measure  Help needed turning from your back to your side while in a flat bed without using bedrails?: None Help needed moving from lying on your back to sitting on the side of a flat bed without using bedrails?: None Help needed moving to and from a bed to a chair (including a wheelchair)?: A Little Help needed standing up from a chair using your arms (e.g., wheelchair or bedside chair)?: None Help needed to walk in hospital room?: A Little Help needed climbing 3-5 steps with a railing? : A Little 6 Click Score: 21    End of Session Equipment Utilized During Treatment: Oxygen Activity Tolerance: Patient limited by fatigue Patient left: in bed;with call bell/phone within reach Nurse Communication: Mobility status PT Visit Diagnosis: Muscle weakness (generalized) (M62.81)     Time: 4268-3419 PT Time Calculation (min) (ACUTE ONLY): 16 min  Charges:  $Gait Training: 8-22 mins                     Blondell Reveal Kistler PT 03/25/2022  Acute Rehabilitation Services  Office [phone]

## 2022-03-25 NOTE — TOC Progression Note (Signed)
Transition of Care [facility]) - Progression Note   Patient Details  Name: KALESE ENSZ MRN: [id number] Date of Birth: September 10, 1955  Transition of Care [facility]) CM/SW [place], LCSW Phone Number: 03/25/2022, 3:48 PM  Clinical Narrative: [facility] chose [facility] for SNF as [facility] is not in-network with [facility] and the patient will not be able to go to SNF under Medicare as it is not her primary insurance. CSW followed up with [facility] regarding bed. [facility] does not have a bed today, but patient's shingles have not crusted over so she will not be able to be admitted until this has occurred.   Expected Discharge Plan: [place] Barriers to Discharge: Continued Medical Work up  Expected Discharge Plan and Services In-house Referral: Clinical Social Work Discharge Planning Services: CM Consult Post Acute Care Choice: [place] Living arrangements for the past 2 months: Single Family Home           DME Arranged: N/A DME Agency: NA  Social Determinants of Health (SDOH) Interventions SDOH Screenings   Food Insecurity: No Food Insecurity (03/16/2022)  Housing: Low Risk  (03/16/2022)  Transportation Needs: No Transportation Needs (03/16/2022)  Utilities: Not At Risk (03/16/2022)  Tobacco Use: Medium Risk (03/15/2022)   Readmission Risk Interventions    03/19/2022   12:16 PM 03/19/2022   11:41 AM  Readmission Risk Prevention Plan  Transportation Screening Complete Complete  PCP or Specialist Appt within 5-7 Days  Complete  Home Care Screening  Complete  Medication Review (RN CM)  Complete

## 2022-03-25 NOTE — Progress Notes (Signed)
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  Hospital course complicated by initiation of ceftriaxone and azithromycin due to lack of clinical improvement on 1/19, steroid-induced hyperglycemia and shingles of right buttocks with fever currently on valacyclovir (started 1/23) with varicella-zoster PCR pending  Assessment and Plan: Acute on chronic respiratory failure secondary to influenza A infection in the setting of-continue outpatient follow-up with Dr. Earlie Server and mild COPD exacerbation, stable - Has completed Tamiflu treatment and course of ceftriaxone and azithromycin (started on 1/29) - Prednisone 40 mg to complete final day of treatment on 1/25 (total 5 days of treatment)  - Hycodan as needed cough - Scheduled Mucinex - Continue scheduled inhalers, as needed albuterol neb - Tessalon Perles as needed   Steroid-induced hyperglycemia, stable - Continue Semglee 10 units twice daily, NovoLog 6 units 3 times daily, sliding scale as needed - Monitor glucose  Leukocytosis, downtrending.  Likely secondary to ongoing steroids - Monitor CBC  Hypertension, stable - Continue Lopressor 12.5 mg daily  CKD stage IIIb, back at baseline creatinine 1.4-2.  AKI on admission related to dehydration in setting of infection, now resolved with IV fluids - Continue to monitor creatinine on BMP - Continue encourage oral intake  Anemia of chronic disease - Started on B12 supplement therapy due  to low normal B12 during hospitalization (231) - Fecal occult positive but reports hemorrhoids otherwise hemoglobin remained stable - Transfuse to keep hemoglobin >7 bleeding  Mood disorder, stable - Continue Wellbutrin  Stage IV adenocarcinoma of the lung Continue outpatient follow-up with Dr. Earlie Server - Hold at aggressive due to side effects (pneumonitis, pneumonia, cough, dyspnea elevated LFTs which patient has had during hospital stay)    Subjective Reports had lots of pain lst night and earlier today from the buttocks lesion. Didn't get much rest. Has usual cough but no other complaints. Having bowel movements. Using her incentive spirometer and flutter valve Physical Exam: Vitals:   03/24/22 2134 03/25/22 0200 03/25/22 0552 03/25/22 0801  BP: (!) 104/54 (!) 108/54 (!) 109/56 122/69  Pulse: 60 64 65 67  Resp: 16 16 14 18   Temp: 98.2 F (36.8 C) 98.6 F (37 C) 98.9 F (37.2 C) 98.4 F (36.9 C)  TempSrc: Oral   Oral  SpO2: 99% 98% 99% 100%  Weight:      Height:        Lying on left side in pain but no acute distress Normal respiratory effort on room air No peripheral edema Herpetic lesion on right buttocks to gluteal cleft, red, tender and not crusted over. No active exudate either Data Reviewed:  There are no new results to review at this time.  Family Communication: None  Disposition: Status is: Inpatient Remains inpatient appropriate because: Continue supportive care for herpetic/shingles waiting for crusting over of lesions before medically stable for disposition to SNF  Planned Discharge Destination: Skilled nursing facility   DVT prophylaxis: Lovenox Author: Desiree Hane, MD 03/25/2022 3:05 PM  For on call review  [email or web address].  

## 2022-03-25 NOTE — Plan of Care (Signed)
  Problem: Activity: Goal: Risk for activity intolerance will decrease Outcome: Progressing   Problem: Safety: Goal: Ability to remain free from injury will improve Outcome: Progressing   

## 2022-03-26 DIAGNOSIS — J101 Influenza due to other identified influenza virus with other respiratory manifestations: Secondary | ICD-10-CM | POA: Diagnosis not present

## 2022-03-26 LAB — COMPREHENSIVE METABOLIC PANEL
ALT: 151 U/L — ABNORMAL HIGH (ref 0–44)
AST: 32 U/L (ref 15–41)
Albumin: 3.2 g/dL — ABNORMAL LOW (ref 3.5–5.0)
Alkaline Phosphatase: 72 U/L (ref 38–126)
Anion gap: 9 (ref 5–15)
BUN: 33 mg/dL — ABNORMAL HIGH (ref 8–23)
CO2: 24 mmol/L (ref 22–32)
Calcium: 8.4 mg/dL — ABNORMAL LOW (ref 8.9–10.3)
Chloride: 105 mmol/L (ref 98–111)
Creatinine, Ser: 1.46 mg/dL — ABNORMAL HIGH (ref 0.44–1.00)
GFR, Estimated: 39 mL/min — ABNORMAL LOW (ref >60)
Glucose, Bld: 201 mg/dL — ABNORMAL HIGH (ref 70–99)
Potassium: 3.6 mmol/L (ref 3.5–5.1)
Sodium: 138 mmol/L (ref 135–145)
Total Bilirubin: 0.6 mg/dL (ref 0.3–1.2)
Total Protein: 5.3 g/dL — ABNORMAL LOW (ref 6.5–8.1)

## 2022-03-26 LAB — CBC WITH DIFFERENTIAL/PLATELET
Abs Immature Granulocytes: 0.21 10*3/uL — ABNORMAL HIGH (ref 0.00–0.07)
Basophils Absolute: 0 10*3/uL (ref 0.0–0.1)
Basophils Relative: 0 %
Eosinophils Absolute: 0.2 10*3/uL (ref 0.0–0.5)
Eosinophils Relative: 2 %
HCT: 29.6 % — ABNORMAL LOW (ref 36.0–46.0)
Hemoglobin: 9.5 g/dL — ABNORMAL LOW (ref 12.0–15.0)
Immature Granulocytes: 2 %
Lymphocytes Relative: 21 %
Lymphs Abs: 2.2 10*3/uL (ref 0.7–4.0)
MCH: 30.8 pg (ref 26.0–34.0)
MCHC: 32.1 g/dL (ref 30.0–36.0)
MCV: 96.1 fL (ref 80.0–100.0)
Monocytes Absolute: 0.9 10*3/uL (ref 0.1–1.0)
Monocytes Relative: 8 %
Neutro Abs: 7.3 10*3/uL (ref 1.7–7.7)
Neutrophils Relative %: 67 %
Platelets: 142 10*3/uL — ABNORMAL LOW (ref 150–400)
RBC: 3.08 MIL/uL — ABNORMAL LOW (ref 3.87–5.11)
RDW: 14.9 % (ref 11.5–15.5)
WBC: 10.8 10*3/uL — ABNORMAL HIGH (ref 4.0–10.5)
nRBC: 0 % (ref 0.0–0.2)

## 2022-03-26 LAB — GLUCOSE, CAPILLARY
Glucose-Capillary: 204 mg/dL — ABNORMAL HIGH (ref 70–99)
Glucose-Capillary: 207 mg/dL — ABNORMAL HIGH (ref 70–99)
Glucose-Capillary: 143 mg/dL — ABNORMAL HIGH (ref 70–99)
Glucose-Capillary: 170 mg/dL — ABNORMAL HIGH (ref 70–99)
Glucose-Capillary: 373 mg/dL — ABNORMAL HIGH (ref 70–99)
Glucose-Capillary: 104 mg/dL — ABNORMAL HIGH (ref 70–99)

## 2022-03-26 LAB — PHOSPHORUS: Phosphorus: 3.3 mg/dL (ref 2.5–4.6)

## 2022-03-26 LAB — MAGNESIUM: Magnesium: 2.1 mg/dL (ref 1.7–2.4)

## 2022-03-26 MED ORDER — INSULIN ASPART 100 UNIT/ML IJ SOLN
0.0000 [IU] | Freq: Three times a day (TID) | INTRAMUSCULAR | Status: DC
  Administered 2022-03-26: 2 [IU] via SUBCUTANEOUS
  Administered 2022-03-26: 9 [IU] via SUBCUTANEOUS
  Administered 2022-03-27 – 2022-03-28 (×2): 2 [IU] via SUBCUTANEOUS
  Administered 2022-03-29 – 2022-03-31 (×7): 1 [IU] via SUBCUTANEOUS

## 2022-03-26 NOTE — Progress Notes (Signed)
Progress Note   Patient: [name] PZW:[id number] DOB: May 10, 1955 DOA: 03/15/2022     11 DOS: the patient was seen and examined on 03/26/2022   Brief hospital course: Ms. 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  Hospital course complicated by initiation of ceftriaxone and azithromycin due to lack of clinical improvement on 1/19, steroid-induced hyperglycemia and shingles of right buttocks with fever currently on valacyclovir (started 1/23) with varicella-zoster PCR pending  Assessment and Plan: Acute on chronic respiratory failure secondary to influenza A infection in the setting of-continue outpatient follow-up with Dr. Earlie Server and mild COPD exacerbation, stable - Has completed Tamiflu treatment and course of ceftriaxone and azithromycin (started on 1/29) - Prednisone 40 mg to complete final day of treatment today 1/25 (total 5 days of treatment) - Hycodan as needed cough - Scheduled Mucinex - Continue scheduled inhalers, as needed albuterol neb - Tessalon Perles as needed  Steroid-induced hyperglycemia, stable - Continue Semglee 10 units twice daily, NovoLog 6 units 3 times daily, sliding scale as needed - Monitor glucose  Leukocytosis, downtrending.  Likely secondary to ongoing steroids - Monitor CBC  Hypertension, stable - Continue Lopressor 12.5 mg daily  CKD stage IIIb, back at baseline creatinine 1.4-2.  AKI on admission related to dehydration in setting of infection, now resolved with IV fluids - Continue to monitor creatinine on BMP - Continue encourage oral intake  Anemia of chronic disease - Started on B12 supplement therapy due to  low normal B12 during hospitalization (231) - Fecal occult positive but reports hemorrhoids otherwise hemoglobin remained stable - Transfuse to keep hemoglobin >7 bleeding  Mood disorder, stable - Continue Wellbutrin  Stage IV adenocarcinoma of the lung Continue outpatient follow-up with Dr. Earlie Server - Hold at aggressive due to side effects (pneumonitis, pneumonia, cough, dyspnea elevated LFTs which patient has had during hospital stay)  Subjective Feels good she finally slept well last night, has no complaints; says lesions on buttocks are still raw and tender.  Physical Exam: Vitals:   03/25/22 1803 03/25/22 2110 03/25/22 2142 03/26/22 0420  BP: 128/73 118/74 (!) 113/57 94/80  Pulse: 77 79 73 70  Resp: 18 20 18 18   Temp: 98.7 F (37.1 C) 98.2 F (36.8 C) 98.3 F (36.8 C) 98.5 F (36.9 C)  TempSrc:   Oral   SpO2: 96% 99% 100% 98%  Weight:      Height:      General:  Alert, oriented, calm, in no acute distress  Eyes: EOMI, clear conjuctivae, white sclerea Neck: supple, no masses, trachea mildline  Cardiovascular: RRR, no murmurs or rubs, no peripheral edema  Respiratory: clear to auscultation bilaterally, no wheezes, no crackles  Abdomen: soft, nontender, nondistended, normal bowel tones heard  Skin: dry, no rashes  Musculoskeletal: no joint effusions, normal range of motion  Psychiatric: appropriate affect, normal speech  Neurologic: extraocular muscles intact, clear speech, moving all extremities with intact sensorium  Data Reviewed:  There are no new results to review at this time.  Family Communication: None  Disposition: Status is: Inpatient Remains inpatient appropriate because: Continue supportive care for herpetic/shingles waiting for crusting over of lesions before medically stable for disposition to  SNF  Planned Discharge Destination: Skilled nursing facility   DVT prophylaxis: Lovenox Author: Richardson Dubree Marry Guan, MD 03/26/2022 9:44 AM  For on  call review [email or web address].

## 2022-03-26 NOTE — TOC Progression Note (Signed)
Transition of Care [facility]) - Progression Note   Patient Details  Name: [name] MRN: [id number] Date of Birth: 1955/07/24  Transition of Care [facility]) CM/SW [place], LCSW Phone Number: 03/26/2022, 3:04 PM  Clinical Narrative: CSW followed up with Loree Fee regarding [facility]. Per Loree Fee, she is still unsure if/when a bed will be available pending the shingles crusting over.  Expected Discharge Plan: [place] Barriers to Discharge: Continued Medical Work up  Expected Discharge Plan and Services In-house Referral: Clinical Social Work Discharge Planning Services: CM Consult Post Acute Care Choice: [place] Living arrangements for the past 2 months: Single Family Home             DME Arranged: N/A DME Agency: NA  Social Determinants of Health (SDOH) Interventions SDOH Screenings   Food Insecurity: No Food Insecurity (03/16/2022)  Housing: Low Risk  (03/16/2022)  Transportation Needs: No Transportation Needs (03/16/2022)  Utilities: Not At Risk (03/16/2022)  Tobacco Use: Medium Risk (03/15/2022)   Readmission Risk Interventions    03/19/2022   12:16 PM 03/19/2022   11:41 AM  Readmission Risk Prevention Plan  Transportation Screening Complete Complete  PCP or Specialist Appt within 5-7 Days  Complete  Home Care Screening  Complete  Medication Review (RN CM)  Complete

## 2022-03-26 NOTE — Progress Notes (Signed)
Mobility Specialist - Progress Note   03/26/22 1355  Mobility  Activity Ambulated with assistance to bathroom  Level of Assistance Standby assist, set-up cues, supervision of patient - no hands on  Assistive Device Front wheel walker  Distance Ambulated (ft) 25 ft  Activity Response Tolerated well  Mobility Referral Yes  $Mobility charge 1 Mobility   Pt received in bed and agreeable to ambulate. Prior to ambulating pt requested assistance to bathroom. Once returning from bathroom pt stated she felt weak. O2 dropped to 83% because she did not have it on at the time. Suggested pt to put O2 back on, bringing O2 back up to 97%. Pt to bed after session with all needs met.    [facility]

## 2022-03-26 NOTE — Inpatient Diabetes Management (Signed)
Inpatient Diabetes Program Recommendations  AACE/ADA: New Consensus Statement on Inpatient Glycemic Control (2015)  Target Ranges:  Prepandial:   less than 140 mg/dL      Peak postprandial:   less than 180 mg/dL (1-2 hours)      Critically ill patients:  140 - 180 mg/dL   Lab Results  Component Value Date   GLUCAP 143 (H) 03/26/2022   HGBA1C 6.6 (H) 03/16/2022    Review of Glycemic Control  Latest Reference Range & Units 03/26/22 01:00 03/26/22 04:19 03/26/22 07:37  Glucose-Capillary 70 - 99 mg/dL 204 (H) 207 (H) 143 (H)   Current orders for Inpatient glycemic control: Novolog 6 units TID, Novolog 0-9 units Q4H, Novolog 0-5 units QHS, Semglee 10 units BID Steroids completed  Inpatient Diabetes Program Recommendations:    Consider changing correction to Novolog 0-9 units TID. With steroids completed, anticipate insulin needs to decrease on 1/26.   Thanks, Bronson Curb, MSN, RNC-OB Diabetes Coordinator [phone] (8a-5p)

## 2022-03-27 DIAGNOSIS — J101 Influenza due to other identified influenza virus with other respiratory manifestations: Secondary | ICD-10-CM | POA: Diagnosis not present

## 2022-03-27 LAB — COMPREHENSIVE METABOLIC PANEL
ALT: 143 U/L — ABNORMAL HIGH (ref 0–44)
AST: 29 U/L (ref 15–41)
Albumin: 3 g/dL — ABNORMAL LOW (ref 3.5–5.0)
Alkaline Phosphatase: 78 U/L (ref 38–126)
Anion gap: 8 (ref 5–15)
BUN: 35 mg/dL — ABNORMAL HIGH (ref 8–23)
CO2: 24 mmol/L (ref 22–32)
Calcium: 8.3 mg/dL — ABNORMAL LOW (ref 8.9–10.3)
Chloride: 106 mmol/L (ref 98–111)
Creatinine, Ser: 1.26 mg/dL — ABNORMAL HIGH (ref 0.44–1.00)
GFR, Estimated: 47 mL/min — ABNORMAL LOW (ref >60)
Glucose, Bld: 126 mg/dL — ABNORMAL HIGH (ref 70–99)
Potassium: 4.1 mmol/L (ref 3.5–5.1)
Sodium: 138 mmol/L (ref 135–145)
Total Bilirubin: 0.5 mg/dL (ref 0.3–1.2)
Total Protein: 5.8 g/dL — ABNORMAL LOW (ref 6.5–8.1)

## 2022-03-27 LAB — PHOSPHORUS: Phosphorus: 2.8 mg/dL (ref 2.5–4.6)

## 2022-03-27 LAB — CBC WITH DIFFERENTIAL/PLATELET
Abs Immature Granulocytes: 0.18 10*3/uL — ABNORMAL HIGH (ref 0.00–0.07)
Basophils Absolute: 0 10*3/uL (ref 0.0–0.1)
Basophils Relative: 0 %
Eosinophils Absolute: 0.2 10*3/uL (ref 0.0–0.5)
Eosinophils Relative: 1 %
HCT: 30.4 % — ABNORMAL LOW (ref 36.0–46.0)
Hemoglobin: 9.7 g/dL — ABNORMAL LOW (ref 12.0–15.0)
Immature Granulocytes: 1 %
Lymphocytes Relative: 18 %
Lymphs Abs: 2.5 10*3/uL (ref 0.7–4.0)
MCH: 30.5 pg (ref 26.0–34.0)
MCHC: 31.9 g/dL (ref 30.0–36.0)
MCV: 95.6 fL (ref 80.0–100.0)
Monocytes Absolute: 0.9 10*3/uL (ref 0.1–1.0)
Monocytes Relative: 6 %
Neutro Abs: 10.4 10*3/uL — ABNORMAL HIGH (ref 1.7–7.7)
Neutrophils Relative %: 74 %
Platelets: 154 10*3/uL (ref 150–400)
RBC: 3.18 MIL/uL — ABNORMAL LOW (ref 3.87–5.11)
RDW: 14.9 % (ref 11.5–15.5)
WBC: 14.1 10*3/uL — ABNORMAL HIGH (ref 4.0–10.5)
nRBC: 0 % (ref 0.0–0.2)

## 2022-03-27 LAB — GLUCOSE, CAPILLARY
Glucose-Capillary: 91 mg/dL (ref 70–99)
Glucose-Capillary: 101 mg/dL — ABNORMAL HIGH (ref 70–99)
Glucose-Capillary: 196 mg/dL — ABNORMAL HIGH (ref 70–99)
Glucose-Capillary: 170 mg/dL — ABNORMAL HIGH (ref 70–99)

## 2022-03-27 LAB — MAGNESIUM: Magnesium: 1.9 mg/dL (ref 1.7–2.4)

## 2022-03-27 LAB — VARICELLA-ZOSTER BY PCR: Varicella-Zoster, PCR: NEGATIVE

## 2022-03-27 MED ORDER — INSULIN GLARGINE-YFGN 100 UNIT/ML ~~LOC~~ SOLN
10.0000 [IU] | Freq: Every day | SUBCUTANEOUS | Status: DC
  Administered 2022-03-28 – 2022-03-31 (×4): 10 [IU] via SUBCUTANEOUS
  Filled 2022-03-27 (×4): qty 0.1

## 2022-03-27 NOTE — Progress Notes (Signed)
Progress Note   Patient: [name] QMG:[id number] DOB: 1956/02/04 DOA: 03/15/2022     12 DOS: the patient was seen and examined on 03/27/2022   Brief hospital course: Ms. 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  Hospital course complicated by initiation of ceftriaxone and azithromycin due to lack of clinical improvement on 1/19, steroid-induced hyperglycemia and shingles of right buttocks with fever currently on valacyclovir (started 1/23) with varicella-zoster PCR pending  Assessment and Plan: Acute on chronic respiratory failure secondary to influenza A infection in the setting of-continue outpatient follow-up with Dr. Earlie Server and mild COPD exacerbation, stable - Has completed Tamiflu treatment and course of ceftriaxone and azithromycin (started on 1/29) - Completed 5 days of Prednisone on 1/25 - Hycodan as needed cough - Scheduled Mucinex - Continue scheduled inhalers, as needed albuterol neb - Tessalon Perles as needed  Steroid-induced hyperglycemia, stable - Continue Semglee 10 units twice daily, NovoLog 6 units 3 times daily, sliding scale as needed - Monitor glucose  Leukocytosis, downtrending.  Likely secondary to ongoing steroids - Monitor CBC  Hypertension, stable - Continue Lopressor 12.5 mg daily  CKD stage IIIb, back at baseline creatinine 1.4-2.  AKI on admission related to dehydration in setting of infection, now resolved with IV fluids - Continue to monitor creatinine on BMP - Continue encourage oral intake  Zoster - continue Valtrex and pain control  Anemia of chronic disease - Started on B12 supplement therapy due to low  normal B12 during hospitalization (231) - Fecal occult positive but reports hemorrhoids otherwise hemoglobin remained stable - Transfuse to keep hemoglobin >7 bleeding  Mood disorder, stable - Continue Wellbutrin  Stage IV adenocarcinoma of the lung Continue outpatient follow-up with Dr. Earlie Server - Hold at aggressive due to side effects (pneumonitis, pneumonia, cough, dyspnea elevated LFTs which patient has had during hospital stay)  Subjective Feels good has no concerns, buttock lesions are still painful.  Physical Exam: Vitals:   03/25/22 2142 03/26/22 0420 03/26/22 1236 03/26/22 2200  BP: (!) 113/57 94/80 112/70 118/76  Pulse: 73 70 90 82  Resp: 18 18 17 18   Temp: 98.3 F (36.8 C) 98.5 F (36.9 C) 98.5 F (36.9 C) 98.2 F (36.8 C)  TempSrc: Oral  Oral Oral  SpO2: 100% 98% 99% 99%  Weight:      Height:      General:  Alert, oriented, calm, in no acute distress, seen this AM with her RN Roh[place]a Eyes: EOMI, clear conjuctivae, white sclerea Neck: supple, no masses, trachea mildline  Cardiovascular: RRR, no murmurs or rubs, no peripheral edema  Respiratory: clear to auscultation bilaterally, no wheezes, no crackles  Abdomen: soft, nontender, nondistended, normal bowel tones heard  Skin: zoster lesions still raw not crusted over at top of intergluteal cleft Musculoskeletal: no joint effusions, normal range of motion  Psychiatric: appropriate affect, normal speech  Neurologic: extraocular muscles intact, clear speech, moving all extremities with intact sensorium  Data Reviewed:  There are no new results to review at this time.  Family Communication: None  Disposition: Status is: Inpatient Remains inpatient appropriate because: Continue supportive care for herpetic/shingles waiting for crusting over of lesions before medically stable  for disposition to SNF  Planned Discharge Destination: Skilled nursing facility   DVT prophylaxis: Lovenox Author: Saima Monterroso Marry Guan, MD 03/27/2022 9:13 AM  For on call review [email or web address].

## 2022-03-27 NOTE — Plan of Care (Signed)
Plan of care reviewed and discussed. °

## 2022-03-27 NOTE — Inpatient Diabetes Management (Signed)
Inpatient Diabetes Program Recommendations  AACE/ADA: New Consensus Statement on Inpatient Glycemic Control (2015)  Target Ranges:  Prepandial:   less than 140 mg/dL      Peak postprandial:   less than 180 mg/dL (1-2 hours)      Critically ill patients:  140 - 180 mg/dL   Lab Results  Component Value Date   GLUCAP 101 (H) 03/27/2022   HGBA1C 6.6 (H) 03/16/2022    Review of Glycemic Control  Latest Reference Range & Units 03/26/22 22:19 03/27/22 08:07 03/27/22 12:18  Glucose-Capillary 70 - 99 mg/dL 104 (H) 91 101 (H)  (H): Data is abnormally high Current orders for Inpatient glycemic control: Novolog 6 units TID, Novolog 0-9 units TID & HS, Novolog 0-5 units QHS, Semglee 10 units BID Steroids completed   Inpatient Diabetes Program Recommendations:     Consider discontinuing Novolog 6 units TID and reducing Semglee to QD.   Thanks, Bronson Curb, MSN, RNC-OB Diabetes Coordinator [phone] (8a-5p)

## 2022-03-27 NOTE — Plan of Care (Signed)
  Problem: Education: Goal: Knowledge of General Education information will improve Description: Including pain rating scale, medication(s)/side effects and non-pharmacologic comfort measures Outcome: Progressing   Problem: Health Behavior/Discharge Planning: Goal: Ability to manage health-related needs will improve Outcome: Progressing   Problem: Clinical Measurements: Goal: Ability to maintain clinical measurements within normal limits will improve Outcome: Progressing   Problem: Elimination: Goal: Will not experience complications related to bowel motility Outcome: Progressing   Problem: Pain Managment: Goal: General experience of comfort will improve Outcome: Progressing   Problem: Safety: Goal: Ability to remain free from injury will improve Outcome: Progressing   Problem: Skin Integrity: Goal: Risk for impaired skin integrity will decrease Outcome: Progressing

## 2022-03-27 NOTE — Progress Notes (Signed)
Physical Therapy Treatment Patient Details Name: [name] MRN: [id number] DOB: 19-Jun-1955 Today's Date: 03/27/2022   History of Present Illness [name] is a 66 y.o. female admitted for further management of influenza A, AKI likely secondary to dehydration. 03/24/22 new dx of shingles.  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 SpO2 99% on 3L O2 walking. Reviewed BUE/LE strengthening exercises with pt.   Recommendations for follow up therapy are one component of a multi-disciplinary discharge planning process, led by the attending physician.  Recommendations may be updated based on patient status, additional functional criteria and insurance authorization.  Follow Up Recommendations  Skilled nursing-short term rehab (<3 hours/day) Can patient physically be transported by private vehicle: Yes   Assistance Recommended at Discharge Set up Supervision/Assistance  Patient can return home with the following A little help with walking and/or transfers;A little help with bathing/dressing/bathroom;Assist for transportation;Help with stairs or ramp for entrance;Assistance with cooking/housework   Equipment Recommendations  None recommended by PT    Recommendations for Other Services       Precautions / Restrictions Precautions Precautions: Fall Precaution Comments: suplemental O2 baseline, reports h/o multiple falls at baseline ("due to MS") Restrictions Weight Bearing Restrictions: No     Mobility  Bed Mobility Overal bed mobility: Independent, Modified Independent       Supine to sit: Modified independent (Device/Increase time) Sit to supine: Modified independent (Device/Increase time)   General bed mobility comments: use of  bedrails    Transfers Overall transfer level: Needs assistance Equipment used: None Transfers: Sit to/from Stand Sit to Stand: Supervision           General transfer comment: supervision for safety 2* h/o falls    Ambulation/Gait Ambulation/Gait assistance: Min guard Gait Distance (Feet): 60 Feet (60' + 18' with seated rest) Assistive device: Rolling walker (2 wheels) Gait Pattern/deviations: Decreased stride length Gait velocity: decr     General Gait Details: steady, no loss of balance, min/guard due to h/o falls, ambulated with 3L O2, SpO2 99%, distance limited by fatigue   Stairs             Wheelchair Mobility    Modified Rankin (Stroke Patients Only)       Balance Overall balance assessment: Needs assistance Sitting-balance support: Feet supported Sitting balance-Leahy Scale: Good     Standing balance support: During functional activity, Bilateral upper extremity supported Standing balance-Leahy Scale: Fair                              Cognition Arousal/Alertness: Awake/alert Behavior During Therapy: WFL for tasks assessed/performed Overall Cognitive Status: Within Functional Limits for tasks assessed                                          Exercises General Exercises - Upper Extremity Shoulder Flexion: AROM, Both, 10 reps, Seated General Exercises - Lower Extremity Ankle Circles/Pumps: AROM, Both, 15 reps, Seated Long Arc Quad: AROM, Both, 10 reps, Seated Hip Flexion/Marching: AROM, Both, 10 reps, Seated    General Comments        Pertinent  Vitals/Pain Pain Assessment Pain Score: 7  Pain Location: sacral area (pt reports this is shingles pain) Pain Descriptors / Indicators: Sore Pain Intervention(s): Limited activity within patient's tolerance, Monitored during session, Premedicated before session    Home Living                          Prior Function            PT Goals (current goals  can now be found in the care plan section) Acute Rehab PT Goals Patient Stated Goal: to get stronger PT Goal Formulation: With patient Time For Goal Achievement: 04/01/22 Potential to Achieve Goals: Good Progress towards PT goals: Progressing toward goals    Frequency    Min 2X/week      PT Plan Current plan remains appropriate    Co-evaluation              AM-PAC PT "6 Clicks" Mobility   Outcome Measure  Help needed turning from your back to your side while in a flat bed without using bedrails?: None Help needed moving from lying on your back to sitting on the side of a flat bed without using bedrails?: None Help needed moving to and from a bed to a chair (including a wheelchair)?: A Little Help needed standing up from a chair using your arms (e.g., wheelchair or bedside chair)?: None Help needed to walk in hospital room?: A Little Help needed climbing 3-5 steps with a railing? : A Little 6 Click Score: 21    End of Session Equipment Utilized During Treatment: Oxygen Activity Tolerance: Patient limited by fatigue Patient left: in bed;with call bell/phone within reach Nurse Communication: Mobility status PT Visit Diagnosis: Muscle weakness (generalized) (M62.81)     Time: 7673-4193 PT Time Calculation (min) (ACUTE ONLY): 18 min  Charges:  $Gait Training: 8-22 mins                     Blondell Reveal Kistler PT 03/27/2022  Acute Rehabilitation Services  Office [phone]

## 2022-03-27 NOTE — TOC Progression Note (Signed)
Transition of Care [facility]) - Progression Note   Patient Details  Name: [name] MRN: [id number] Date of Birth: Sep 30, 1955  Transition of Care [facility]) CM/SW [place], LCSW Phone Number: 03/27/2022, 1:54 PM  Clinical Narrative: [facility] with [facility] is unsure if the facility will have a bed by Monday.    Expected Discharge Plan: [place] Barriers to Discharge: Continued Medical Work up  Expected Discharge Plan and Services In-house Referral: Clinical Social Work Discharge Planning Services: CM Consult Post Acute Care Choice: [place] Living arrangements for the past 2 months: Single Family Home           DME Arranged: N/A DME Agency: NA  Social Determinants of Health (SDOH) Interventions SDOH Screenings   Food Insecurity: No Food Insecurity (03/16/2022)  Housing: Low Risk  (03/16/2022)  Transportation Needs: No Transportation Needs (03/16/2022)  Utilities: Not At Risk (03/16/2022)  Tobacco Use: Medium Risk (03/15/2022)   Readmission Risk Interventions    03/19/2022   12:16 PM 03/19/2022   11:41 AM  Readmission Risk Prevention Plan  Transportation Screening Complete Complete  PCP or Specialist Appt within 5-7 Days  Complete  Home Care Screening  Complete  Medication Review (RN CM)  Complete

## 2022-03-28 DIAGNOSIS — J101 Influenza due to other identified influenza virus with other respiratory manifestations: Secondary | ICD-10-CM | POA: Diagnosis not present

## 2022-03-28 LAB — CBC WITH DIFFERENTIAL/PLATELET
Abs Immature Granulocytes: 0.1 10*3/uL — ABNORMAL HIGH (ref 0.00–0.07)
Basophils Absolute: 0 10*3/uL (ref 0.0–0.1)
Basophils Relative: 0 %
Eosinophils Absolute: 0.3 10*3/uL (ref 0.0–0.5)
Eosinophils Relative: 3 %
HCT: 32.6 % — ABNORMAL LOW (ref 36.0–46.0)
Hemoglobin: 10 g/dL — ABNORMAL LOW (ref 12.0–15.0)
Immature Granulocytes: 1 %
Lymphocytes Relative: 23 %
Lymphs Abs: 2.4 10*3/uL (ref 0.7–4.0)
MCH: 30 pg (ref 26.0–34.0)
MCHC: 30.7 g/dL (ref 30.0–36.0)
MCV: 97.9 fL (ref 80.0–100.0)
Monocytes Absolute: 0.8 10*3/uL (ref 0.1–1.0)
Monocytes Relative: 7 %
Neutro Abs: 6.8 10*3/uL (ref 1.7–7.7)
Neutrophils Relative %: 66 %
Platelets: 144 10*3/uL — ABNORMAL LOW (ref 150–400)
RBC: 3.33 MIL/uL — ABNORMAL LOW (ref 3.87–5.11)
RDW: 15.2 % (ref 11.5–15.5)
WBC: 10.3 10*3/uL (ref 4.0–10.5)
nRBC: 0 % (ref 0.0–0.2)

## 2022-03-28 LAB — COMPREHENSIVE METABOLIC PANEL
ALT: 115 U/L — ABNORMAL HIGH (ref 0–44)
AST: 24 U/L (ref 15–41)
Albumin: 3 g/dL — ABNORMAL LOW (ref 3.5–5.0)
Alkaline Phosphatase: 77 U/L (ref 38–126)
Anion gap: 8 (ref 5–15)
BUN: 32 mg/dL — ABNORMAL HIGH (ref 8–23)
CO2: 23 mmol/L (ref 22–32)
Calcium: 8.3 mg/dL — ABNORMAL LOW (ref 8.9–10.3)
Chloride: 107 mmol/L (ref 98–111)
Creatinine, Ser: 1.39 mg/dL — ABNORMAL HIGH (ref 0.44–1.00)
GFR, Estimated: 42 mL/min — ABNORMAL LOW (ref >60)
Glucose, Bld: 143 mg/dL — ABNORMAL HIGH (ref 70–99)
Potassium: 3.8 mmol/L (ref 3.5–5.1)
Sodium: 138 mmol/L (ref 135–145)
Total Bilirubin: 0.5 mg/dL (ref 0.3–1.2)
Total Protein: 5.4 g/dL — ABNORMAL LOW (ref 6.5–8.1)

## 2022-03-28 LAB — PHOSPHORUS: Phosphorus: 3.5 mg/dL (ref 2.5–4.6)

## 2022-03-28 LAB — MAGNESIUM: Magnesium: 2 mg/dL (ref 1.7–2.4)

## 2022-03-28 LAB — GLUCOSE, CAPILLARY
Glucose-Capillary: 113 mg/dL — ABNORMAL HIGH (ref 70–99)
Glucose-Capillary: 105 mg/dL — ABNORMAL HIGH (ref 70–99)
Glucose-Capillary: 154 mg/dL — ABNORMAL HIGH (ref 70–99)
Glucose-Capillary: 139 mg/dL — ABNORMAL HIGH (ref 70–99)

## 2022-03-28 MED ORDER — SODIUM CHLORIDE 0.9 % IV SOLN: 250.0000 mL | INTRAVENOUS | Status: DC | PRN

## 2022-03-28 MED ORDER — ORAL CARE MOUTH RINSE: 15.0000 mL | OROMUCOSAL | Status: DC | PRN

## 2022-03-28 MED ORDER — SODIUM CHLORIDE 0.9% FLUSH
3.0000 mL | Freq: Two times a day (BID) | INTRAVENOUS | Status: DC
  Administered 2022-03-28 (×2): 3 mL via INTRAVENOUS

## 2022-03-28 MED ORDER — SODIUM CHLORIDE 0.9% FLUSH: 3.0000 mL | INTRAVENOUS | Status: DC | PRN

## 2022-03-28 NOTE — Progress Notes (Signed)
Pharmacy Heparin Induced Thrombocytopenia (HIT) Note:  [name] is an 67 y.o. female being evaluated for HIT. Lovenox was started 03/20/22 for VTE prophx, and baseline platelets were 229. (320 on 03/15/22)  HIT labs were ordered on 03/28/22 when platelets dropped to 144.  Auto-populate labs: No results found for: "HEPINDPLTAB", "SRALOWDOSEHP", "SRAHIGHDOSEH"   CALCULATE SCORE:  4Ts (see the HIT Algorithm) Score  Thrombocytopenia 2  Timing 2  Thrombosis 0  Other causes of thrombocytopenia 1,  elevated ALT, infection  Total 5     Recommendations (A or B) are based on available lab results (HIT antibody and/or SRA) and the HIT algorithm    A. HIT antibody result available  Possible HIT   Order SRA:  Awaiting AB result Discontinue heparin / LMWH:  Yes Initiate alternative anticoagulation:  No, use SCDs for now Document heparin allergy:  Not yet   B. SRA result availability  SRA not available   Name of MD Contacted: [place]  Plan (Discussed with provider) Labs ordered:  SRA ordered  Heparin allergy:  Heparin allergy documented or updated. Anticoagulation plans:  Discontinue heparin / LMWH   Comments (List any alternative plans or if there are contraindications to therapy)  [name], PharmD, BCPS Clinical Staff Pharmacist [email or web address] [place] 03/28/2022, 2:47 PM

## 2022-03-28 NOTE — Progress Notes (Signed)
  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  Hospital course complicated by initiation of ceftriaxone and azithromycin due to lack of clinical improvement on 1/19, steroid-induced hyperglycemia and shingles of right buttocks with fever currently on valacyclovir (started 1/23) with varicella-zoster PCR pending  03/28/2022: Patient seen.  No new complaints.  Assessment and Plan: Acute on chronic respiratory failure secondary to influenza A infection in the setting of-continue outpatient follow-up with Dr. Earlie Server and mild COPD exacerbation, stable - Has completed Tamiflu treatment and course of ceftriaxone and azithromycin (started on 1/29) - Completed 5 days of Prednisone on 1/25 - Hycodan as needed cough - Scheduled Mucinex - Continue scheduled inhalers, as needed albuterol neb - Tessalon Perles as needed  Steroid-induced hyperglycemia, stable - Continue Semglee 10 units twice daily, NovoLog 6 units 3 times daily, sliding scale as needed - Monitor glucose  Leukocytosis, downtrending.  Likely secondary to ongoing steroids - Monitor CBC 03/28/2022: Resolved.  WBCs 10.3 today.  Continue to monitor closely.  Hypertension, stable - Continue Lopressor 12.5 mg daily  CKD stage IIIb, back at baseline creatinine 1.4-2.  AKI on admission related to dehydration in setting of infection, now resolved with IV fluids - Continue to monitor creatinine on BMP - Continue encourage oral  intake  Zoster - continue Valtrex and pain control  Anemia of chronic disease - Started on B12 supplement therapy due to low normal B12 during hospitalization (231) - Fecal occult positive but reports hemorrhoids otherwise hemoglobin remained stable - Transfuse to keep hemoglobin >7 bleeding  Mood disorder, stable - Continue Wellbutrin  Stage IV adenocarcinoma of the lung Continue outpatient follow-up with Dr. Earlie Server - Hold at aggressive due to side effects (pneumonitis, pneumonia, cough, dyspnea elevated LFTs which patient has had during hospital stay)  Subjective No new complaints today.  Physical Exam: Vitals:   03/27/22 1220 03/27/22 2234 03/28/22 0622 03/28/22 1417  BP: 107/62 (!) 96/55 (!) 109/54 (!) 100/58  Pulse: 69 65 80 81  Resp: 18  18 17   Temp: 98 F (36.7 C) 98.3 F (36.8 C) 98.5 F (36.9 C) 98.5 F (36.9 C)  TempSrc: Oral Oral Oral   SpO2: 100% 100% 100% 100%  Weight:      Height:      General: Awake and alert.  Not in any distress. HEENT: Patient is pale. Neck: Supple. Lungs: Clear to auscultation anteriorly. CVS: S1-S2. Neuro: Awake and alert.  Moves all extremities. Extremities: No edema.    Data Reviewed:  There are no new results to review at this time.  Family Communication: None  Disposition: Status is: Inpatient Remains inpatient appropriate because: Continue supportive care for herpetic/shingles waiting for crusting over of lesions before medically stable for disposition to SNF  Planned Discharge Destination: Skilled nursing facility   DVT prophylaxis: Lovenox Author: Bonnell Public, MD 03/28/2022 5:35 PM  For on call review [email or web address].

## 2022-03-29 DIAGNOSIS — J101 Influenza due to other identified influenza virus with other respiratory manifestations: Secondary | ICD-10-CM | POA: Diagnosis not present

## 2022-03-29 LAB — GLUCOSE, CAPILLARY
Glucose-Capillary: 134 mg/dL — ABNORMAL HIGH (ref 70–99)
Glucose-Capillary: 122 mg/dL — ABNORMAL HIGH (ref 70–99)
Glucose-Capillary: 123 mg/dL — ABNORMAL HIGH (ref 70–99)
Glucose-Capillary: 158 mg/dL — ABNORMAL HIGH (ref 70–99)

## 2022-03-29 LAB — CBC WITH DIFFERENTIAL/PLATELET
Abs Immature Granulocytes: 0.13 10*3/uL — ABNORMAL HIGH (ref 0.00–0.07)
Basophils Absolute: 0 10*3/uL (ref 0.0–0.1)
Basophils Relative: 0 %
Eosinophils Absolute: 0.2 10*3/uL (ref 0.0–0.5)
Eosinophils Relative: 2 %
HCT: 31.7 % — ABNORMAL LOW (ref 36.0–46.0)
Hemoglobin: 9.9 g/dL — ABNORMAL LOW (ref 12.0–15.0)
Immature Granulocytes: 1 %
Lymphocytes Relative: 16 %
Lymphs Abs: 1.5 10*3/uL (ref 0.7–4.0)
MCH: 30.4 pg (ref 26.0–34.0)
MCHC: 31.2 g/dL (ref 30.0–36.0)
MCV: 97.2 fL (ref 80.0–100.0)
Monocytes Absolute: 0.8 10*3/uL (ref 0.1–1.0)
Monocytes Relative: 8 %
Neutro Abs: 6.8 10*3/uL (ref 1.7–7.7)
Neutrophils Relative %: 73 %
Platelets: 132 10*3/uL — ABNORMAL LOW (ref 150–400)
RBC: 3.26 MIL/uL — ABNORMAL LOW (ref 3.87–5.11)
RDW: 15 % (ref 11.5–15.5)
WBC: 9.5 10*3/uL (ref 4.0–10.5)
nRBC: 0 % (ref 0.0–0.2)

## 2022-03-29 LAB — BASIC METABOLIC PANEL
Anion gap: 9 (ref 5–15)
BUN: 27 mg/dL — ABNORMAL HIGH (ref 8–23)
CO2: 22 mmol/L (ref 22–32)
Calcium: 8.3 mg/dL — ABNORMAL LOW (ref 8.9–10.3)
Chloride: 105 mmol/L (ref 98–111)
Creatinine, Ser: 1.36 mg/dL — ABNORMAL HIGH (ref 0.44–1.00)
GFR, Estimated: 43 mL/min — ABNORMAL LOW (ref >60)
Glucose, Bld: 123 mg/dL — ABNORMAL HIGH (ref 70–99)
Potassium: 4 mmol/L (ref 3.5–5.1)
Sodium: 136 mmol/L (ref 135–145)

## 2022-03-29 LAB — PHOSPHORUS: Phosphorus: 2.8 mg/dL (ref 2.5–4.6)

## 2022-03-29 LAB — MAGNESIUM: Magnesium: 1.9 mg/dL (ref 1.7–2.4)

## 2022-03-29 NOTE — Plan of Care (Signed)
  Problem: Health Behavior/Discharge Planning: Goal: Ability to manage health-related needs will improve Outcome: Progressing   Problem: Activity: Goal: Risk for activity intolerance will decrease Outcome: Progressing   

## 2022-03-29 NOTE — Progress Notes (Signed)
  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  Hospital course is significant for initiation of ceftriaxone and azithromycin due to lack of clinical improvement on 1/19, steroid-induced hyperglycemia and shingles of right buttocks with fever currently on valacyclovir (started 1/23).  Varicella-zoster PCR came back negative.    03/29/2022: Patient seen.  No new complaints.  Assessment and Plan: Acute on chronic respiratory failure: -Secondary to influenza A infection.   -Patient follows up with Dr. Earlie Server.  Patient has mild COPD exacerbation, stable -Patient has completed Tamiflu treatment and course of ceftriaxone and azithromycin. - Completed 5 days of Prednisone on 1/25 - Hycodan as needed cough - Scheduled Mucinex - Continue scheduled inhalers, as needed albuterol neb - Tessalon Perles as needed  Steroid-induced hyperglycemia, stable - Continue Semglee 10 units twice daily, NovoLog 6 units 3 times daily, sliding scale as needed - Monitor glucose  Leukocytosis -Downward trending.   -Likely secondary to steroids - Monitor CBC 03/29/2022: Resolved.  WBCs 9.5 today. -Continue to monitor closely.  Hypertension, stable - Continue Lopressor 12.5 mg daily  CKD stage IIIb: -Back at baseline creatinine 1.4-2.  AKI on admission related to dehydration in setting of infection, now resolved with IV fluids - Continue to monitor creatinine on BMP - Continue encourage oral  intake  Zoster -Continue Valtrex and pain control  Anemia of chronic disease - Started on B12 supplement therapy due to low normal B12 during hospitalization (231) - Fecal occult positive but reports hemorrhoids otherwise hemoglobin remained stable - Transfuse to keep hemoglobin >7 bleeding  Mood disorder, stable - Continue Wellbutrin  Stage IV adenocarcinoma of the lung Continue outpatient follow-up with Dr. Earlie Server - Hold at aggressive due to side effects (pneumonitis, pneumonia, cough, dyspnea elevated LFTs which patient has had during hospital stay)  Subjective No new complaints today.  Physical Exam: Vitals:   03/28/22 1417 03/28/22 2021 03/29/22 0518 03/29/22 1509  BP: (!) 100/58 112/69 100/67 104/68  Pulse: 81 80 84 83  Resp: 17 18 19 18   Temp: 98.5 F (36.9 C) 98.6 F (37 C) 98.5 F (36.9 C) 98.8 F (37.1 C)  TempSrc:  Oral Oral Oral  SpO2: 100% 97% 95% 98%  Weight:      Height:      General: Awake and alert.  Not in any distress. HEENT: Patient is pale. Neck: Supple. Lungs: Clear to auscultation anteriorly. CVS: S1-S2. Neuro: Awake and alert.  Moves all extremities. Extremities: No edema.    Data Reviewed:  There are no new results to review at this time.  Family Communication: None  Disposition: Status is: Inpatient Remains inpatient appropriate because: Continue supportive care for herpetic/shingles waiting for crusting over of lesions before medically stable for disposition to SNF  Planned Discharge Destination: Skilled nursing facility   DVT prophylaxis: Lovenox Author: Bonnell Public, MD 03/29/2022 5:25 PM  For on call review [email or web address].

## 2022-03-30 DIAGNOSIS — N179 Acute kidney failure, unspecified: Secondary | ICD-10-CM | POA: Diagnosis not present

## 2022-03-30 DIAGNOSIS — C7801 Secondary malignant neoplasm of right lung: Secondary | ICD-10-CM | POA: Diagnosis not present

## 2022-03-30 DIAGNOSIS — J101 Influenza due to other identified influenza virus with other respiratory manifestations: Secondary | ICD-10-CM | POA: Diagnosis not present

## 2022-03-30 DIAGNOSIS — J9621 Acute and chronic respiratory failure with hypoxia: Secondary | ICD-10-CM | POA: Diagnosis not present

## 2022-03-30 LAB — GLUCOSE, CAPILLARY
Glucose-Capillary: 93 mg/dL (ref 70–99)
Glucose-Capillary: 129 mg/dL — ABNORMAL HIGH (ref 70–99)
Glucose-Capillary: 145 mg/dL — ABNORMAL HIGH (ref 70–99)
Glucose-Capillary: 125 mg/dL — ABNORMAL HIGH (ref 70–99)
Glucose-Capillary: 156 mg/dL — ABNORMAL HIGH (ref 70–99)

## 2022-03-30 LAB — BASIC METABOLIC PANEL
Anion gap: 8 (ref 5–15)
BUN: 23 mg/dL (ref 8–23)
CO2: 22 mmol/L (ref 22–32)
Calcium: 8.1 mg/dL — ABNORMAL LOW (ref 8.9–10.3)
Chloride: 105 mmol/L (ref 98–111)
Creatinine, Ser: 1.39 mg/dL — ABNORMAL HIGH (ref 0.44–1.00)
GFR, Estimated: 42 mL/min — ABNORMAL LOW (ref >60)
Glucose, Bld: 131 mg/dL — ABNORMAL HIGH (ref 70–99)
Potassium: 3.6 mmol/L (ref 3.5–5.1)
Sodium: 135 mmol/L (ref 135–145)

## 2022-03-30 LAB — CBC WITH DIFFERENTIAL/PLATELET
Abs Immature Granulocytes: 0.08 10*3/uL — ABNORMAL HIGH (ref 0.00–0.07)
Basophils Absolute: 0 10*3/uL (ref 0.0–0.1)
Basophils Relative: 0 %
Eosinophils Absolute: 0.2 10*3/uL (ref 0.0–0.5)
Eosinophils Relative: 2 %
HCT: 29.9 % — ABNORMAL LOW (ref 36.0–46.0)
Hemoglobin: 9.2 g/dL — ABNORMAL LOW (ref 12.0–15.0)
Immature Granulocytes: 1 %
Lymphocytes Relative: 16 %
Lymphs Abs: 1.4 10*3/uL (ref 0.7–4.0)
MCH: 30.1 pg (ref 26.0–34.0)
MCHC: 30.8 g/dL (ref 30.0–36.0)
MCV: 97.7 fL (ref 80.0–100.0)
Monocytes Absolute: 0.7 10*3/uL (ref 0.1–1.0)
Monocytes Relative: 8 %
Neutro Abs: 6.4 10*3/uL (ref 1.7–7.7)
Neutrophils Relative %: 73 %
Platelets: 113 10*3/uL — ABNORMAL LOW (ref 150–400)
RBC: 3.06 MIL/uL — ABNORMAL LOW (ref 3.87–5.11)
RDW: 15 % (ref 11.5–15.5)
WBC: 8.7 10*3/uL (ref 4.0–10.5)
nRBC: 0 % (ref 0.0–0.2)

## 2022-03-30 LAB — PHOSPHORUS: Phosphorus: 3.3 mg/dL (ref 2.5–4.6)

## 2022-03-30 LAB — MAGNESIUM: Magnesium: 1.8 mg/dL (ref 1.7–2.4)

## 2022-03-30 MED ORDER — GABAPENTIN 100 MG PO CAPS
100.0000 mg | ORAL_CAPSULE | Freq: Three times a day (TID) | ORAL | Status: DC
  Administered 2022-03-30 – 2022-03-31 (×3): 100 mg via ORAL
  Filled 2022-03-30 (×3): qty 1

## 2022-03-30 NOTE — Hospital Course (Addendum)
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  Patient was started on ceftriaxone and azithromycin due to lack of clinical improvement on 03/20/22.  Patient developed steroid-induced hyperglycemia and shingles of right buttocks with fever currently on valacyclovir (started 1/23).  Varicella-zoster PCR came back negative.     Assessment and Plan: Acute on chronic respiratory failure secondary to influenza A infection.: -Patient follows up with Dr. Earlie Server for lung cancer..  Completed Tamiflu and prednisone for 5 days.  Continue supportive care with Mucinex inhalers Tessalon Perles.  Currently on room air.   Steroid-induced hyperglycemia, stable Continue Semglee and NovoLog and sliding scale insulin.   Leukocytosis Likely secondary to steroids.  Improved.  Latest WBC at 8.7.  Hypertension, stable Continue Lopressor.   CKD stage IIIb: Baseline creatinine  1.4-2.  AKI on admission secondary to volume depletion.  Resolved with IV fluids.  Creatinine today at 1.3.  Currently at baseline.    Herpes zoster -Continue Valtrex and pain control   Anemia of chronic disease Hemoglobin of 9.2 today.  Fecal occult positive but has hemorrhoids.  Was started on vitamin B12 due to levels.    Mood disorder, stable Continue Wellbutrin   Stage IV adenocarcinoma of the lung Continue outpatient follow-up with Dr. Earlie Server. Hold adagrasib  Deconditioning, Debility.  Patient has been seen by physical therapy and recommended skilled nursing facility placement but will need scabs to be dry prior to disposition.

## 2022-03-30 NOTE — Progress Notes (Signed)
Physical Therapy Treatment and Discharge from Acute PT Patient Details Name: [name] MRN: [id number] DOB: [birth year] Today's Date: 03/30/2022   History of Present Illness [name] is a 67 y.o. female admitted for further management of influenza A, AKI likely secondary to dehydration. 03/24/22 new dx of shingles.  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  Pt able to mobilize in room independently while therapist in room.  Pt reports she has HEP and has been performing exercises.  Pt did not have any questions.  Pt agreeable to d/c from acute PT at this time.  Updated d/c recommendations to HHPT as pt currently ambulating independently in hospital room.  Acute PT to sign off at this time. (Also notified TOC team of change in recommendation)   Recommendations for follow up therapy are one component of a multi-disciplinary discharge planning process, led by the attending physician.  Recommendations may be updated based on patient status, additional functional criteria and insurance authorization.  Follow Up Recommendations  Home health PT     Assistance Recommended at Discharge PRN  Patient can return home with the following A little help with walking and/or transfers;A little help with bathing/dressing/bathroom;Assist for transportation;Help with stairs or ramp for entrance;Assistance with cooking/housework   Equipment Recommendations  None recommended by PT    Recommendations for Other Services       Precautions / Restrictions Precautions Precautions: Fall Precaution Comments: suplemental O2 baseline, reports h/o multiple falls at baseline ("due to MS")     Mobility  Bed Mobility Overal bed mobility: Independent                   Transfers Overall transfer level: Modified independent                 General transfer comment: pt reports being up ad lib in room (goes to/from bathroom on her own)    Ambulation/Gait Ambulation/Gait assistance: Modified independent (Device/Increase time), Supervision Gait Distance (Feet): 25 Feet Assistive device: None Gait Pattern/deviations: WFL(Within Functional Limits)       General Gait Details: steady, no loss of balance, reports moving around her room on her own, pt had removed oxygen prior to therapist arriving to room and ambulated on room air, SPO2 98% on room air upon return to bed   Stairs             Wheelchair Mobility    Modified Rankin (Stroke Patients Only)       Balance                                            Cognition Arousal/Alertness: Awake/alert Behavior During Therapy: WFL for tasks assessed/performed Overall Cognitive Status: Within Functional Limits for tasks assessed                                          Exercises      General Comments        Pertinent Vitals/Pain Pain Assessment Pain Assessment: Faces Faces Pain Scale: Hurts a little bit Pain Location:  lower back, radiating "nerve" pain down legs Pain Descriptors / Indicators: Shooting, Burning Pain Intervention(s): Repositioned, Monitored during session    Home Living                          Prior Function            PT Goals (current goals can now be found in the care plan section) Progress towards PT goals: Goals met/education completed, patient discharged from PT    Frequency           PT Plan Discharge plan needs to be updated    Co-evaluation              AM-PAC PT "6 Clicks" Mobility   Outcome Measure  Help needed turning from your back to your side while in a flat bed without using bedrails?: None Help needed moving from lying on your back to sitting on the side of a flat bed  without using bedrails?: None Help needed moving to and from a bed to a chair (including a wheelchair)?: None Help needed standing up from a chair using your arms (e.g., wheelchair or bedside chair)?: None Help needed to walk in hospital room?: A Little Help needed climbing 3-5 steps with a railing? : A Little 6 Click Score: 22    End of Session   Activity Tolerance: Patient tolerated treatment well Patient left: in bed;with call bell/phone within reach   PT Visit Diagnosis: Muscle weakness (generalized) (M62.81)     Time: 5909-3112 PT Time Calculation (min) (ACUTE ONLY): 12 min  Charges:  $Gait Training: 8-22 mins                    [name], DPT Physical Therapist Acute Rehabilitation Services Preferred contact method: Secure Chat Weekend Pager Only: [phone] Office: [phone]   [name] [place] 03/30/2022, 3:28 PM

## 2022-03-30 NOTE — Progress Notes (Signed)
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  Patient was started on ceftriaxone and azithromycin due to lack of clinical improvement on 03/20/22.  Patient developed steroid-induced hyperglycemia and shingles of right buttocks with fever currently on valacyclovir (started 1/23).  Varicella-zoster PCR came back negative.     Assessment and Plan: Acute on chronic respiratory failure secondary to influenza A infection.: -Patient follows up with Dr. Earlie Server for lung cancer..  Completed Tamiflu and prednisone for 5 days.  Continue supportive care with Mucinex inhalers Tessalon Perles.  Currently on room air.   Steroid-induced hyperglycemia, stable Continue Semglee and NovoLog and sliding scale insulin.  Latest POC glucose of 145   Leukocytosis Likely secondary to steroids.  Improved.  Latest WBC at 8.7.  Hypertension, stable Continue Lopressor.  Blood pressure stable at 108/66   Acute kidney injury on CKD stage IIIb: Baseline creatinine  1.4-2.0.  AKI on admission secondary to volume depletion.  Resolved with IV fluids.  Creatinine today at 1.3.  Currently at baseline.    Herpes zoster -Continue Valtrex and pain control.  Add Neurontin.   Anemia of chronic disease Hemoglobin of 9.2 today.  Fecal occult positive but has hemorrhoids.  Was started on vitamin B12 due to levels.    Mood disorder, stable Continue Wellbutrin   Stage IV adenocarcinoma of the lung Continue outpatient follow-up with Dr. Earlie Server. Hold  adagrasib  Deconditioning, Debility.  Patient has been seen by physical therapy and recommended skilled nursing facility placement but will need scabs to be dry prior to disposition.     DVT prophylaxis: SCDs Start: 03/15/22 2335   Code Status:     Code Status: Full Code  Disposition: Skilled nursing facility when bed is available  Status is: Inpatient  Remains inpatient appropriate because: For skilled nursing facility   Family Communication: None  Consultants:  None  Procedures:  None  Antimicrobials:  Valtrex  Anti-infectives (From admission, onward)    Start     Dose/Rate Route Frequency Ordered Stop   03/24/22 2200  valACYclovir (VALTREX) tablet 1,000 mg        1,000 mg Oral 2 times daily 03/24/22 1736     03/23/22 1800  cefTRIAXone (ROCEPHIN) 1 g in sodium chloride 0.9 % 100 mL IVPB        1 g 200 mL/hr over 30 Minutes Intravenous Every 24 hours 03/22/22 2009 03/23/22 1821   03/19/22 1815  azithromycin (ZITHROMAX) tablet 500 mg        500 mg Oral Daily 03/19/22 1718 03/23/22 0925   03/19/22 1800  cefTRIAXone (ROCEPHIN) 1 g in sodium chloride 0.9 % 100 mL IVPB  Status:  Discontinued        1 g 200 mL/hr over 30 Minutes Intravenous Every 24 hours 03/19/22 1718 03/22/22 2009   03/16/22 1000  oseltamivir (TAMIFLU) capsule 30 mg        30 mg Oral Daily 03/15/22 2359 03/19/22 0849   03/15/22 2345  oseltamivir (TAMIFLU) capsule 75 mg  Status:  Discontinued        75 mg Oral 2 times daily 03/15/22 2334 03/15/22 2358      Subjective: Today, patient was seen and examined at bedside.  Patient denies any increasing shortness of breath, dyspnea, chest pain, fever or chills.  Still complains of some pain on the back with some shooting sensation down the leg.  Objective: Vitals:   03/29/22 2224 03/30/22 0556 03/30/22 0830 03/30/22 1205  BP: 119/78 104/75  108/66  Pulse: 98 80  96  Resp:  18  18  Temp:  99 F (37.2 C)  99.6 F (37.6 C)  TempSrc:  Oral  Oral  SpO2:   97% 98% 98%  Weight:      Height:        Intake/Output Summary (Last 24 hours) at 03/30/2022 1247 Last data filed at 03/30/2022 0900 Gross per 24 hour  Intake 960 ml  Output --  Net 960 ml   Filed Weights   03/15/22 1831  Weight: 79.4 kg    Physical Examination: Body mass index is 27.41 kg/m.   General:  Average built, not in obvious distress HENT:   No scleral pallor or icterus noted. Oral mucosa is moist.  Chest: .  Diminished breath sounds bilaterally. No crackles or wheezes.  CVS: S1 &S2 heard. No murmur.  Regular rate and rhythm. Abdomen: Soft, nontender, nondistended.  Bowel sounds are heard.   Extremities: No cyanosis, clubbing or edema.  Peripheral pulses are palpable. Psych: Alert, awake and oriented, normal mood CNS:  No cranial nerve deficits.  Power equal in all extremities.   Skin: Warm and dry.  Herpetic lesions on the back with mild ulceration.  Slightly dry.  Data Reviewed:   CBC: Recent Labs  Lab 03/26/22 0416 03/27/22 0344 03/28/22 0155 03/29/22 0251 03/30/22 0303  WBC 10.8* 14.1* 10.3 9.5 8.7  NEUTROABS 7.3 10.4* 6.8 6.8 6.4  HGB 9.5* 9.7* 10.0* 9.9* 9.2*  HCT 29.6* 30.4* 32.6* 31.7* 29.9*  MCV 96.1 95.6 97.9 97.2 97.7  PLT 142* 154 144* 132* 113*    Basic Metabolic Panel: Recent Labs  Lab 03/26/22 0416 03/27/22 0344 03/28/22 0155 03/29/22 0251 03/30/22 0303  NA 138 138 138 136 135  K 3.6 4.1 3.8 4.0 3.6  CL 105 106 107 105 105  CO2 24 24 23 22 22   GLUCOSE 201* 126* 143* 123* 131*  BUN 33* 35* 32* 27* 23  CREATININE 1.46* 1.26* 1.39* 1.36* 1.39*  CALCIUM 8.4* 8.3* 8.3* 8.3* 8.1*  MG 2.1 1.9 2.0 1.9 1.8  PHOS 3.3 2.8 3.5 2.8 3.3    Liver Function Tests: Recent Labs  Lab 03/24/22 0303 03/25/22 0446 03/26/22 0416 03/27/22 0344 03/28/22 0155  AST 39 37 32 29 24  ALT 148* 156* 151* 143* 115*  ALKPHOS 65 62 72 78 77  BILITOT 0.4 0.7 0.6 0.5 0.5  PROT 5.3* 5.6* 5.3* 5.8* 5.4*  ALBUMIN 2.7* 2.9* 3.2* 3.0* 3.0*      Radiology Studies: No results found.    LOS: 15 days   Flora Lipps, MD [facility] Available via Epic secure chat 7am-7pm After these hours, please refer to coverage provider listed on [email or web address] 03/30/2022, 12:47 PM

## 2022-03-31 DIAGNOSIS — D638 Anemia in other chronic diseases classified elsewhere: Secondary | ICD-10-CM | POA: Diagnosis not present

## 2022-03-31 DIAGNOSIS — J101 Influenza due to other identified influenza virus with other respiratory manifestations: Secondary | ICD-10-CM | POA: Diagnosis not present

## 2022-03-31 DIAGNOSIS — N179 Acute kidney failure, unspecified: Secondary | ICD-10-CM | POA: Diagnosis not present

## 2022-03-31 DIAGNOSIS — C7801 Secondary malignant neoplasm of right lung: Secondary | ICD-10-CM | POA: Diagnosis not present

## 2022-03-31 LAB — CBC WITH DIFFERENTIAL/PLATELET
Abs Immature Granulocytes: 0.08 10*3/uL — ABNORMAL HIGH (ref 0.00–0.07)
Basophils Absolute: 0 10*3/uL (ref 0.0–0.1)
Basophils Relative: 0 %
Eosinophils Absolute: 0.2 10*3/uL (ref 0.0–0.5)
Eosinophils Relative: 2 %
HCT: 29.9 % — ABNORMAL LOW (ref 36.0–46.0)
Hemoglobin: 9.4 g/dL — ABNORMAL LOW (ref 12.0–15.0)
Immature Granulocytes: 1 %
Lymphocytes Relative: 10 %
Lymphs Abs: 1 10*3/uL (ref 0.7–4.0)
MCH: 30.9 pg (ref 26.0–34.0)
MCHC: 31.4 g/dL (ref 30.0–36.0)
MCV: 98.4 fL (ref 80.0–100.0)
Monocytes Absolute: 0.8 10*3/uL (ref 0.1–1.0)
Monocytes Relative: 8 %
Neutro Abs: 7.3 10*3/uL (ref 1.7–7.7)
Neutrophils Relative %: 79 %
Platelets: 111 10*3/uL — ABNORMAL LOW (ref 150–400)
RBC: 3.04 MIL/uL — ABNORMAL LOW (ref 3.87–5.11)
RDW: 15.3 % (ref 11.5–15.5)
WBC: 9.4 10*3/uL (ref 4.0–10.5)
nRBC: 0 % (ref 0.0–0.2)

## 2022-03-31 LAB — BASIC METABOLIC PANEL
Anion gap: 8 (ref 5–15)
BUN: 23 mg/dL (ref 8–23)
CO2: 22 mmol/L (ref 22–32)
Calcium: 8.2 mg/dL — ABNORMAL LOW (ref 8.9–10.3)
Chloride: 107 mmol/L (ref 98–111)
Creatinine, Ser: 1.3 mg/dL — ABNORMAL HIGH (ref 0.44–1.00)
GFR, Estimated: 45 mL/min — ABNORMAL LOW (ref >60)
Glucose, Bld: 119 mg/dL — ABNORMAL HIGH (ref 70–99)
Potassium: 3.7 mmol/L (ref 3.5–5.1)
Sodium: 137 mmol/L (ref 135–145)

## 2022-03-31 LAB — GLUCOSE, CAPILLARY: Glucose-Capillary: 128 mg/dL — ABNORMAL HIGH (ref 70–99)

## 2022-03-31 LAB — PHOSPHORUS: Phosphorus: 3.8 mg/dL (ref 2.5–4.6)

## 2022-03-31 LAB — MAGNESIUM: Magnesium: 2 mg/dL (ref 1.7–2.4)

## 2022-03-31 LAB — HEPARIN INDUCED PLATELET AB (HIT ANTIBODY): Heparin Induced Plt Ab: 0.091 OD (ref 0.000–0.400)

## 2022-03-31 MED ORDER — FAMOTIDINE 10 MG PO TABS: 10.0000 mg | ORAL_TABLET | Freq: Every day | ORAL | 0 refills | Status: DC

## 2022-03-31 MED ORDER — HEPARIN SOD (PORK) LOCK FLUSH 100 UNIT/ML IV SOLN
500.0000 [IU] | INTRAVENOUS | Status: AC | PRN
  Administered 2022-03-31: 500 [IU]

## 2022-03-31 MED ORDER — SENNOSIDES-DOCUSATE SODIUM 8.6-50 MG PO TABS: 1.0000 | ORAL_TABLET | Freq: Every evening | ORAL | 0 refills | Status: AC | PRN

## 2022-03-31 MED ORDER — BENZONATATE 200 MG PO CAPS: 200.0000 mg | ORAL_CAPSULE | Freq: Three times a day (TID) | ORAL | 0 refills | Status: DC | PRN

## 2022-03-31 MED ORDER — GUAIFENESIN ER 600 MG PO TB12: 600.0000 mg | ORAL_TABLET | Freq: Two times a day (BID) | ORAL | 0 refills | Status: AC

## 2022-03-31 MED ORDER — VALACYCLOVIR HCL 1 G PO TABS: 1000.0000 mg | ORAL_TABLET | Freq: Two times a day (BID) | ORAL | 0 refills | Status: AC

## 2022-03-31 MED ORDER — GABAPENTIN 100 MG PO CAPS: 100.0000 mg | ORAL_CAPSULE | Freq: Three times a day (TID) | ORAL | 0 refills | Status: DC

## 2022-03-31 MED ORDER — METFORMIN HCL ER 500 MG PO TB24: 500.0000 mg | ORAL_TABLET | Freq: Every day | ORAL | 2 refills | Status: DC

## 2022-03-31 MED ORDER — CYANOCOBALAMIN 1000 MCG PO TABS: 1000.0000 ug | ORAL_TABLET | Freq: Every day | ORAL | 0 refills | Status: AC

## 2022-03-31 NOTE — Discharge Summary (Signed)
Physician Discharge Summary  [name] WNU:[id number] DOB: 05-Sep-1955 DOA: 03/15/2022  PCP: [name], NP  Admit date: 03/15/2022 Discharge date: 03/31/2022  Admitted From: Home  Discharge disposition: Home with home health  Recommendations for Outpatient Follow-Up:   Follow up with your primary care provider in one week.  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  Diet recommendation: Low sodium, heart healthy.  Diabetic diet.  Wound care: None.  Code status: Full.   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  Patient was started on ceftriaxone and azithromycin due to lack of clinical improvement on 03/20/22.  Patient developed steroid-induced hyperglycemia and shingles of right buttocks with fever currently on valacyclovir (started 1/23).  Varicella-zoster PCR came back negative.     Hospital Course:   Following conditions were addressed during hospitalization as listed below,  Assessment and Plan: Acute on chronic respiratory failure secondary to influenza A  infection.: -Patient follows up with Dr. Earlie Server for lung cancer.  Completed Tamiflu and prednisone for 5 days.  Continue supportive care with Mucinex inhalers Tessalon Perles.  Currently on room air.   Steroid-induced hyperglycemia, new onset diabetes. Received insulin regimen while in the hospital.  Off steroids at this time.  A1c of 6.6 on 03/24/2022.  Will consider metformin on discharge.  Diabetic diet.   Leukocytosis Likely secondary to steroids.  Improved.  Latest WBC at 9.4.   Hypertension, stable Continue Lopressor   Acute kidney injury on CKD stage IIIb: Baseline creatinine  1.4-2.0.  AKI on admission secondary to volume depletion.  Resolved with IV fluids.  Creatinine prior to discharge was 1.3.   Herpes zoster -Continue Valtrex and Neurontin on discharge.  Neuropathy symptoms have improved at this time.   Anemia of chronic disease Hemoglobin of 9.4 today.  Fecal occult positive but has hemorrhoids.  Was started on vitamin B12 due to levels.  Follow-up with PCP as outpatient.   Mood disorder, stable Continue Wellbutrin   Stage IV adenocarcinoma of the lung Continue outpatient follow-up with Dr. Earlie Server.  During hospitalization, adagrasib on hold.  Patient is about to complete the course of Valtrex.  Will resume on discharge.   Deconditioning, Debility.  Physical therapy has recommended home with home health at this time.  Disposition.  At this time, patient is stable for disposition home with home health on discharge.  Patient will follow-up with Dr. Julien Nordmann oncology as outpatient and primary care physician.  Medical Consultants:   None.  Procedures:    None Subjective:   Today, patient patient was seen and examined at  bedside.  Denies any nausea vomiting fever chills or rigor.  Feels better with her mobility and pain on her back.  Discharge Exam:   Vitals:   03/31/22 0549 03/31/22 1000  BP: (!) 89/46 94/60  Pulse: 83 88  Resp: 16 16  Temp: 99 F (37.2  C)   SpO2: 98%    Vitals:   03/30/22 1205 03/30/22 2202 03/31/22 0549 03/31/22 1000  BP: 108/66 (!) 106/59 (!) 89/46 94/60  Pulse: 96 81 83 88  Resp: 18 18 16 16   Temp: 99.6 F (37.6 C) 98.4 F (36.9 C) 99 F (37.2 C)   TempSrc: Oral     SpO2: 98% 99% 98%   Weight:      Height:       Body mass index is 27.41 kg/m.  General: Alert awake, not in obvious distress, HENT: pupils equally reacting to light,  No scleral pallor or icterus noted. Oral mucosa is moist.  Edentulous. Chest:    Diminished breath sounds bilaterally. No crackles or wheezes.  Right chest wall port in place. CVS: S1 &S2 heard. No murmur.  Regular rate and rhythm. Abdomen: Soft, nontender, nondistended.  Bowel sounds are heard.   Extremities: No cyanosis, clubbing or edema.  Peripheral pulses are palpable. Psych: Alert, awake and oriented, normal mood CNS:  No cranial nerve deficits.  Power equal in all extremities.   Skin: Warm and dry.  Few herpetic lesions on the back with mild ulceration.  No blisters or active oozing.  The results of significant diagnostics from this hospitalization (including imaging, microbiology, ancillary and laboratory) are listed below for reference.     Diagnostic Studies:   DG Chest 2 View  Result Date: 03/15/2022 CLINICAL DATA:  shob EXAM: CHEST - 2 VIEW COMPARISON:  October 04, 2018., February 12, 2022 FINDINGS: The cardiomediastinal silhouette is unchanged in contour.RIGHT chest port with tip terminating over the superior cavoatrial junction. No pleural effusion. No pneumothorax. No acute pleuroparenchymal abnormality. Known bilateral pulmonary nodularity is better assessed on recent PET-CT. Visualized abdomen is unremarkable. Similar minimal wedging of a upper thoracic vertebral body. IMPRESSION: 1. No acute cardiopulmonary abnormality. 2. Known bilateral pulmonary nodularity is better assessed on recent PET-CT. Electronically Signed   By: Valentino Saxon M.D.   On: 03/15/2022  19:14     Labs:   Basic Metabolic Panel: Recent Labs  Lab 03/27/22 0344 03/28/22 0155 03/29/22 0251 03/30/22 0303 03/31/22 0442  NA 138 138 136 135 137  K 4.1 3.8 4.0 3.6 3.7  CL 106 107 105 105 107  CO2 24 23 22 22 22   GLUCOSE 126* 143* 123* 131* 119*  BUN 35* 32* 27* 23 23  CREATININE 1.26* 1.39* 1.36* 1.39* 1.30*  CALCIUM 8.3* 8.3* 8.3* 8.1* 8.2*  MG 1.9 2.0 1.9 1.8 2.0  PHOS 2.8 3.5 2.8 3.3 3.8   GFR Estimated Creatinine Clearance: 45.5 mL/min (A) (by C-G formula based on SCr of 1.3 mg/dL (H)). Liver Function Tests: Recent Labs  Lab 03/25/22 0446 03/26/22 0416 03/27/22 0344 03/28/22 0155  AST 37 32 29 24  ALT 156* 151* 143* 115*  ALKPHOS 62 72 78 77  BILITOT 0.7 0.6 0.5 0.5  PROT 5.6* 5.3* 5.8* 5.4*  ALBUMIN 2.9* 3.2* 3.0* 3.0*   No results for input(s): "LIPASE", "AMYLASE" in the last 168 hours. No results for input(s): "AMMONIA" in the last 168 hours. Coagulation profile No results for input(s): "INR", "PROTIME" in the last 168 hours.  CBC: Recent Labs  Lab 03/27/22  0076 03/28/22 0155 03/29/22 0251 03/30/22 0303 03/31/22 0442  WBC 14.1* 10.3 9.5 8.7 9.4  NEUTROABS 10.4* 6.8 6.8 6.4 7.3  HGB 9.7* 10.0* 9.9* 9.2* 9.4*  HCT 30.4* 32.6* 31.7* 29.9* 29.9*  MCV 95.6 97.9 97.2 97.7 98.4  PLT 154 144* 132* 113* 111*   Cardiac Enzymes: No results for input(s): "CKTOTAL", "CKMB", "CKMBINDEX", "TROPONINI" in the last 168 hours. BNP: Invalid input(s): "POCBNP" CBG: Recent Labs  Lab 03/30/22 0737 03/30/22 1203 03/30/22 1721 03/30/22 2157 03/31/22 0810  GLUCAP 129* 145* 125* 156* 128*   D-Dimer No results for input(s): "DDIMER" in the last 72 hours. Hgb A1c No results for input(s): "HGBA1C" in the last 72 hours. Lipid Profile No results for input(s): "CHOL", "HDL", "LDLCALC", "TRIG", "CHOLHDL", "LDLDIRECT" in the last 72 hours. Thyroid function studies No results for input(s): "TSH", "T4TOTAL", "T3FREE", "THYROIDAB" in the last 72  hours.  Invalid input(s): "FREET3" Anemia work up No results for input(s): "VITAMINB12", "FOLATE", "FERRITIN", "TIBC", "IRON", "RETICCTPCT" in the last 72 hours. Microbiology Recent Results (from the past 240 hour(s))  Urine Culture     Status: None   Collection Time: 03/21/22  5:56 PM   Specimen: Urine, Clean Catch  Result Value Ref Range Status   Specimen Description   Final    URINE, CLEAN CATCH Performed at [facility], [place] [address], [place], [place] [address]    Special Requests   Final    NONE Performed at [facility], [place] [address] [place], [place] [address]    Culture   Final    NO GROWTH Performed at [facility], [place] [address] [place], [place] [address]    Report Status 03/22/2022 FINAL  Final  Varicella-zoster by PCR     Status: None   Collection Time: 03/25/22  4:46 AM   Specimen: Blood  Result Value Ref Range Status   Varicella-Zoster, PCR Negative Negative Final    Comment: (NOTE) No Varicella Zoster Virus DNA detected. This test was developed and its performance characteristics determined by [facility].  It has not been cleared or approved by the Food and Drug Administration.  The FDA has determined that such clearance or approval is not necessary. Performed At: [facility] [place], [place] [id number] Rush Farmer MD OT:[id number]      Discharge Instructions:   Discharge Instructions     Call MD for:  persistant nausea and vomiting   Complete by: As directed    Call MD for:  severe uncontrolled pain   Complete by: As directed    Call MD for:  temperature >100.4   Complete by: As directed    Diet Carb Modified   Complete by: As directed    Discharge instructions   Complete by: As directed    Follow up with your primary care provider in one week.  Check blood work at that time.  Complete the course of Valtrex for shingles.  Seek medical attention for worsening symptoms.   Continue physical therapy at home.  Follow-up with Dr. Julien Nordmann oncology as scheduled by the clinic.   Face-to-face encounter (required for Medicare/Medicaid patients)   Complete by: As directed    I Myndi Wamble certify that this patient is under my care and that I, or a nurse practitioner or physician's assistant working with me, had a face-to-face encounter that meets the physician face-to-face encounter requirements with this patient on 03/31/2022. The encounter with the patient was in whole, or in part  for the following medical condition(s) which is the primary reason for home health care (List medical condition): Deconditioning, debility, chronic respiratory failure,   The encounter with the patient was in whole, or in part, for the following medical condition, which is the primary reason for home health care: Deconditioning, debility, chronic respiratory failure.   I certify that, based on my findings, the following services are medically necessary home health services: Physical therapy   Reason for Medically Necessary Home Health Services: Therapy- Therapeutic Exercises to Increase Strength and Endurance   My clinical findings support the need for the above services: Unable to leave home safely without assistance and/or assistive device   Further, I certify that my clinical findings support that this patient is homebound due to: Unable to leave home safely without assistance   Home Health   Complete by: As directed    To provide the following care/treatments: PT   Increase activity slowly   Complete by: As directed    No wound care   Complete by: As directed       Allergies as of 03/31/2022       Reactions   Ciprofloxacin Hives   Levofloxacin    UNSPECIFIED REACTION, BUT LIKELY HIVES > HIVES WITH CIPRO   Statins Other (See Comments)   Liver enzymes Liver enzymes   Tricor [fenofibrate] Hives, Itching   Compazine [prochlorperazine]    Hx of tardive dyskinesia with compazine    Latex Other (See Comments)   Patient states "Messes with my skin."   Codeine Nausea And Vomiting   Metoprolol Other (See Comments)   Does not tolerate beta blockers 10/02/2013-pt states she can tolerate 25 mg and lower        Medication List     STOP taking these medications    doxycycline 100 MG tablet Commonly known as: VIBRA-TABS   ibuprofen 200 MG tablet Commonly known as: ADVIL   methylPREDNISolone 4 MG Tbpk tablet Commonly known as: MEDROL DOSEPAK       TAKE these medications    albuterol 108 (90 Base) MCG/ACT inhaler Commonly known as: VENTOLIN HFA Inhale 1 puff into the lungs every 6 (six) hours as needed (wheezing/shortness of breath.).   albuterol (5 MG/ML) 0.5% nebulizer solution Commonly known as: PROVENTIL Take 2.5 mg by nebulization every 6 (six) hours as needed for wheezing.   aspirin 81 MG chewable tablet Chew 1 tablet (81 mg total) by mouth daily.   benzonatate 200 MG capsule Commonly known as: TESSALON Take 1 capsule (200 mg total) by mouth 3 (three) times daily as needed for cough.   Breztri Aerosphere 160-9-4.8 MCG/ACT Aero Generic drug: Budeson-Glycopyrrol-Formoterol Inhale 2 puffs into the lungs in the morning and at bedtime.   buPROPion 150 MG 24 hr tablet Commonly known as: WELLBUTRIN XL Take 150 mg by mouth daily. What changed: Another medication with the same name was removed. Continue taking this medication, and follow the directions you see here.   cyanocobalamin 1000 MCG tablet Take 1 tablet (1,000 mcg total) by mouth daily.   famotidine 10 MG tablet Commonly known as: PEPCID Take 1 tablet (10 mg total) by mouth at bedtime.   gabapentin 100 MG capsule Commonly known as: NEURONTIN Take 1 capsule (100 mg total) by mouth 3 (three) times daily for 15 days.   guaiFENesin 600 MG 12 hr tablet Commonly known as: MUCINEX Take 1 tablet (600 mg total) by mouth 2 (two) times daily for 5 days.   HYDROcodone bit-homatropine 5-1.5 MG/5ML  syrup Commonly known as: Hycodan Take 5 mLs by mouth every 6 (six) hours as needed for cough.   ipratropium-albuterol 0.5-2.5 (3) MG/3ML Soln Commonly known as: DUONEB Inhale 3 mLs into the lungs every 6 (six) hours as needed (Asthma).   Krazati 200 MG tablet Generic drug: adagrasib Take 2 tablets (400 mg total) by mouth 2 (two) times daily.   LORazepam 2 MG tablet Commonly known as: ATIVAN Take 2 mg by mouth every evening.   metFORMIN 500 MG 24 hr tablet Commonly known as: GLUCOPHAGE-XR Take 1 tablet (500 mg total) by mouth daily with breakfast.   metoprolol tartrate 25 MG tablet Commonly known as: LOPRESSOR TAKE 1/2 TABLET(12.5 MG) BY MOUTH TWICE DAILY What changed: See the new instructions.   nitroGLYCERIN 0.4 MG SL tablet Commonly known as: Nitrostat Place 1 tablet (0.4 mg total) under the tongue every 5 (five) minutes as needed for chest pain.   ondansetron 4 MG disintegrating tablet Commonly known as: ZOFRAN-ODT Take 1 tablet (4 mg total) by mouth every 8 (eight) hours as needed for nausea or vomiting.   senna-docusate 8.6-50 MG tablet Commonly known as: Senokot-S Take 1 tablet by mouth at bedtime as needed for mild constipation.   valACYclovir 1000 MG tablet Commonly known as: VALTREX Take 1 tablet (1,000 mg total) by mouth 2 (two) times daily for 3 days.   Vitamin D (Ergocalciferol) 1.25 MG (50000 UNIT) Caps capsule Commonly known as: DRISDOL Take 50,000 Units by mouth every 7 (seven) days. Wednesdays         Time coordinating discharge: 39 minutes  Signed:  Shontia Gillooly  [facility] 03/31/2022, 1:31 PM

## 2022-03-31 NOTE — Progress Notes (Signed)
Reviewed written d/c instructions w pt and all questions answered. She verbalized understanding. Awaiting HHPT details/ins authorization.

## 2022-03-31 NOTE — TOC Transition Note (Signed)
Transition of Care [facility]) - CM/SW Discharge Note  Patient Details  Name: [name] MRN: [id number] Date of Birth: 23-Sep-1955  [facility] of Care [facility]) CM/SW Contact:  Sherie Don, LCSW Phone Number: 03/31/2022, 12:34 PM  Clinical Narrative: PT's evaluation is now recommending HHPT. Patient is agreeable to [facility] referral. CSW made [facility] referral to Amy with [facility] as the agency is in-network with [facility]. Referral was accepted. Patient updated. TOC signing off.   Final next level of care: Home w Home Health Services Barriers to Discharge: Barriers Resolved  Patient Goals and CMS Choice CMS Medicare.gov Compare Post Acute Care list provided to:: Patient Choice offered to / list presented to : Patient  Discharge Plan and Services Additional resources added to the After Visit Summary for   In-house Referral: Clinical Social Work Discharge Planning Services: CM Consult Post Acute Care Choice: [place]          DME Arranged: N/A DME Agency: NA HH Arranged: PT HH Agency: [place] Date [place]: 03/31/22 Representative spoke with at [place]: Amy  Social Determinants of Health ([employer]) Interventions [place]: No Food Insecurity (03/16/2022)  Housing: Low Risk  (03/16/2022)  Transportation Needs: No Transportation Needs (03/16/2022)  Utilities: Not At Risk (03/16/2022)  Tobacco Use: Medium Risk (03/15/2022)   Readmission Risk Interventions    03/19/2022   12:16 PM 03/19/2022   11:41 AM  Readmission Risk Prevention Plan  Transportation Screening Complete Complete  PCP or Specialist Appt within 5-7 Days  Complete  Home Care Screening  Complete  Medication Review (RN CM)  Complete

## 2022-03-31 NOTE — Progress Notes (Signed)
Pt d/c via w/c w all belongings in stable condition.

## 2022-04-02 ENCOUNTER — Other Ambulatory Visit (HOSPITAL_COMMUNITY): Payer: Self-pay

## 2022-04-06 ENCOUNTER — Other Ambulatory Visit (HOSPITAL_COMMUNITY): Payer: Self-pay

## 2022-04-07 ENCOUNTER — Telehealth: Payer: Self-pay

## 2022-04-07 NOTE — Telephone Encounter (Signed)
This nurse received a call from [facility] PT.  She states that she has been attempting to reach this patient so that her physical therapy assessment can be completed.  She has not been able to reach her and her voicemail box is full. She states that she will attempt to get the assessment completed on Friday 2/9.   This nurse attempted to reach patient and was also unable to leave a message as well.  This nurse will attempt to reach patient via My Chart message.  Provider will be made aware.  No further concerns noted at this time.

## 2022-04-10 ENCOUNTER — Emergency Department (HOSPITAL_COMMUNITY): Payer: Managed Care, Other (non HMO)

## 2022-04-10 ENCOUNTER — Other Ambulatory Visit: Payer: Self-pay

## 2022-04-10 ENCOUNTER — Encounter (HOSPITAL_COMMUNITY): Payer: Self-pay | Admitting: Radiology

## 2022-04-10 ENCOUNTER — Inpatient Hospital Stay (HOSPITAL_COMMUNITY)
Admission: EM | Admit: 2022-04-10 | Discharge: 2022-04-13 | DRG: 392 | Disposition: A | Discharge status: Home Health | Payer: Managed Care, Other (non HMO) | Attending: Internal Medicine | Admitting: Internal Medicine

## 2022-04-10 DIAGNOSIS — Z79899 Other long term (current) drug therapy: Secondary | ICD-10-CM

## 2022-04-10 DIAGNOSIS — Z885 Allergy status to narcotic agent status: Secondary | ICD-10-CM

## 2022-04-10 DIAGNOSIS — Z79890 Hormone replacement therapy (postmenopausal): Secondary | ICD-10-CM

## 2022-04-10 DIAGNOSIS — J9611 Chronic respiratory failure with hypoxia: Secondary | ICD-10-CM | POA: Diagnosis present

## 2022-04-10 DIAGNOSIS — Z86010 Personal history of colonic polyps: Secondary | ICD-10-CM

## 2022-04-10 DIAGNOSIS — Z800 Family history of malignant neoplasm of digestive organs: Secondary | ICD-10-CM

## 2022-04-10 DIAGNOSIS — Z8379 Family history of other diseases of the digestive system: Secondary | ICD-10-CM

## 2022-04-10 DIAGNOSIS — Z9981 Dependence on supplemental oxygen: Secondary | ICD-10-CM

## 2022-04-10 DIAGNOSIS — Z833 Family history of diabetes mellitus: Secondary | ICD-10-CM

## 2022-04-10 DIAGNOSIS — J4489 Other specified chronic obstructive pulmonary disease: Secondary | ICD-10-CM | POA: Diagnosis present

## 2022-04-10 DIAGNOSIS — Z8279 Family history of other congenital malformations, deformations and chromosomal abnormalities: Secondary | ICD-10-CM

## 2022-04-10 DIAGNOSIS — K580 Irritable bowel syndrome with diarrhea: Secondary | ICD-10-CM | POA: Diagnosis not present

## 2022-04-10 DIAGNOSIS — N390 Urinary tract infection, site not specified: Secondary | ICD-10-CM | POA: Diagnosis present

## 2022-04-10 DIAGNOSIS — M797 Fibromyalgia: Secondary | ICD-10-CM | POA: Diagnosis present

## 2022-04-10 DIAGNOSIS — Z825 Family history of asthma and other chronic lower respiratory diseases: Secondary | ICD-10-CM

## 2022-04-10 DIAGNOSIS — N1832 Chronic kidney disease, stage 3b: Secondary | ICD-10-CM | POA: Diagnosis present

## 2022-04-10 DIAGNOSIS — Z7982 Long term (current) use of aspirin: Secondary | ICD-10-CM

## 2022-04-10 DIAGNOSIS — R918 Other nonspecific abnormal finding of lung field: Secondary | ICD-10-CM | POA: Diagnosis present

## 2022-04-10 DIAGNOSIS — K219 Gastro-esophageal reflux disease without esophagitis: Secondary | ICD-10-CM | POA: Diagnosis present

## 2022-04-10 DIAGNOSIS — I2510 Atherosclerotic heart disease of native coronary artery without angina pectoris: Secondary | ICD-10-CM | POA: Diagnosis present

## 2022-04-10 DIAGNOSIS — Z955 Presence of coronary angioplasty implant and graft: Secondary | ICD-10-CM

## 2022-04-10 DIAGNOSIS — Z888 Allergy status to other drugs, medicaments and biological substances status: Secondary | ICD-10-CM

## 2022-04-10 DIAGNOSIS — D638 Anemia in other chronic diseases classified elsewhere: Secondary | ICD-10-CM | POA: Diagnosis present

## 2022-04-10 DIAGNOSIS — Z1152 Encounter for screening for COVID-19: Secondary | ICD-10-CM

## 2022-04-10 DIAGNOSIS — D509 Iron deficiency anemia, unspecified: Secondary | ICD-10-CM | POA: Diagnosis present

## 2022-04-10 DIAGNOSIS — Z813 Family history of other psychoactive substance abuse and dependence: Secondary | ICD-10-CM

## 2022-04-10 DIAGNOSIS — Z7951 Long term (current) use of inhaled steroids: Secondary | ICD-10-CM

## 2022-04-10 DIAGNOSIS — K900 Celiac disease: Secondary | ICD-10-CM | POA: Diagnosis present

## 2022-04-10 DIAGNOSIS — C3492 Malignant neoplasm of unspecified part of left bronchus or lung: Secondary | ICD-10-CM | POA: Diagnosis present

## 2022-04-10 DIAGNOSIS — E876 Hypokalemia: Secondary | ICD-10-CM | POA: Diagnosis not present

## 2022-04-10 DIAGNOSIS — E039 Hypothyroidism, unspecified: Secondary | ICD-10-CM | POA: Diagnosis present

## 2022-04-10 DIAGNOSIS — Z91040 Latex allergy status: Secondary | ICD-10-CM

## 2022-04-10 DIAGNOSIS — Z881 Allergy status to other antibiotic agents status: Secondary | ICD-10-CM

## 2022-04-10 DIAGNOSIS — R109 Unspecified abdominal pain: Principal | ICD-10-CM | POA: Diagnosis present

## 2022-04-10 DIAGNOSIS — E785 Hyperlipidemia, unspecified: Secondary | ICD-10-CM | POA: Diagnosis present

## 2022-04-10 DIAGNOSIS — I129 Hypertensive chronic kidney disease with stage 1 through stage 4 chronic kidney disease, or unspecified chronic kidney disease: Secondary | ICD-10-CM | POA: Diagnosis present

## 2022-04-10 DIAGNOSIS — Z87891 Personal history of nicotine dependence: Secondary | ICD-10-CM

## 2022-04-10 DIAGNOSIS — R1012 Left upper quadrant pain: Secondary | ICD-10-CM

## 2022-04-10 DIAGNOSIS — R4182 Altered mental status, unspecified: Secondary | ICD-10-CM

## 2022-04-10 DIAGNOSIS — Z7984 Long term (current) use of oral hypoglycemic drugs: Secondary | ICD-10-CM

## 2022-04-10 DIAGNOSIS — J449 Chronic obstructive pulmonary disease, unspecified: Secondary | ICD-10-CM | POA: Diagnosis present

## 2022-04-10 DIAGNOSIS — F39 Unspecified mood [affective] disorder: Secondary | ICD-10-CM | POA: Diagnosis present

## 2022-04-10 LAB — BLOOD GAS, ARTERIAL
Acid-Base Excess: 5.1 mmol/L — ABNORMAL HIGH (ref 0.0–2.0)
Bicarbonate: 29 mmol/L — ABNORMAL HIGH (ref 20.0–28.0)
Drawn by: 331471
O2 Saturation: 99 %
Patient temperature: 37
pCO2 arterial: 39 mmHg (ref 32–48)
pH, Arterial: 7.48 — ABNORMAL HIGH (ref 7.35–7.45)
pO2, Arterial: 87 mmHg (ref 83–108)

## 2022-04-10 LAB — CBC WITH DIFFERENTIAL/PLATELET
Abs Immature Granulocytes: 0.04 10*3/uL (ref 0.00–0.07)
Basophils Absolute: 0.1 10*3/uL (ref 0.0–0.1)
Basophils Relative: 1 %
Eosinophils Absolute: 0.4 10*3/uL (ref 0.0–0.5)
Eosinophils Relative: 9 %
HCT: 28.9 % — ABNORMAL LOW (ref 36.0–46.0)
Hemoglobin: 9 g/dL — ABNORMAL LOW (ref 12.0–15.0)
Immature Granulocytes: 1 %
Lymphocytes Relative: 20 %
Lymphs Abs: 1 10*3/uL (ref 0.7–4.0)
MCH: 30 pg (ref 26.0–34.0)
MCHC: 31.1 g/dL (ref 30.0–36.0)
MCV: 96.3 fL (ref 80.0–100.0)
Monocytes Absolute: 0.7 10*3/uL (ref 0.1–1.0)
Monocytes Relative: 13 %
Neutro Abs: 2.8 10*3/uL (ref 1.7–7.7)
Neutrophils Relative %: 56 %
Platelets: 296 10*3/uL (ref 150–400)
RBC: 3 MIL/uL — ABNORMAL LOW (ref 3.87–5.11)
RDW: 15.6 % — ABNORMAL HIGH (ref 11.5–15.5)
WBC: 5 10*3/uL (ref 4.0–10.5)
nRBC: 0 % (ref 0.0–0.2)

## 2022-04-10 LAB — COMPREHENSIVE METABOLIC PANEL
ALT: 21 U/L (ref 0–44)
AST: 20 U/L (ref 15–41)
Albumin: 3.1 g/dL — ABNORMAL LOW (ref 3.5–5.0)
Alkaline Phosphatase: 90 U/L (ref 38–126)
Anion gap: 10 (ref 5–15)
BUN: 18 mg/dL (ref 8–23)
CO2: 25 mmol/L (ref 22–32)
Calcium: 8.6 mg/dL — ABNORMAL LOW (ref 8.9–10.3)
Chloride: 103 mmol/L (ref 98–111)
Creatinine, Ser: 1.19 mg/dL — ABNORMAL HIGH (ref 0.44–1.00)
GFR, Estimated: 50 mL/min — ABNORMAL LOW (ref >60)
Glucose, Bld: 153 mg/dL — ABNORMAL HIGH (ref 70–99)
Potassium: 3.5 mmol/L (ref 3.5–5.1)
Sodium: 138 mmol/L (ref 135–145)
Total Bilirubin: 0.7 mg/dL (ref 0.3–1.2)
Total Protein: 6.4 g/dL — ABNORMAL LOW (ref 6.5–8.1)

## 2022-04-10 LAB — URINALYSIS, ROUTINE W REFLEX MICROSCOPIC
Bilirubin Urine: NEGATIVE
Glucose, UA: NEGATIVE mg/dL
Ketones, ur: NEGATIVE mg/dL
Leukocytes,Ua: NEGATIVE
Nitrite: NEGATIVE
Protein, ur: NEGATIVE mg/dL
Specific Gravity, Urine: 1.011 (ref 1.005–1.030)
pH: 6 (ref 5.0–8.0)

## 2022-04-10 LAB — PROTIME-INR
INR: 1 (ref 0.8–1.2)
Prothrombin Time: 13.2 seconds (ref 11.4–15.2)

## 2022-04-10 LAB — LACTIC ACID, PLASMA
Lactic Acid, Venous: 1.2 mmol/L (ref 0.5–1.9)
Lactic Acid, Venous: 1 mmol/L (ref 0.5–1.9)

## 2022-04-10 LAB — TSH: TSH: 5.761 u[IU]/mL — ABNORMAL HIGH (ref 0.350–4.500)

## 2022-04-10 LAB — RESP PANEL BY RT-PCR (RSV, FLU A&B, COVID)  RVPGX2
Influenza A by PCR: NEGATIVE
Influenza B by PCR: NEGATIVE
Resp Syncytial Virus by PCR: NEGATIVE
SARS Coronavirus 2 by RT PCR: NEGATIVE

## 2022-04-10 LAB — LIPASE, BLOOD: Lipase: 26 U/L (ref 11–51)

## 2022-04-10 LAB — AMMONIA: Ammonia: 14 umol/L (ref 9–35)

## 2022-04-10 MED ORDER — ASPIRIN 81 MG PO TBEC
81.0000 mg | DELAYED_RELEASE_TABLET | Freq: Every day | ORAL | Status: DC
  Administered 2022-04-10 – 2022-04-13 (×4): 81 mg via ORAL
  Filled 2022-04-10 (×4): qty 1

## 2022-04-10 MED ORDER — SODIUM CHLORIDE 0.9 % IV BOLUS
500.0000 mL | Freq: Once | INTRAVENOUS | Status: AC
  Administered 2022-04-10: 500 mL via INTRAVENOUS

## 2022-04-10 MED ORDER — GABAPENTIN 100 MG PO CAPS
100.0000 mg | ORAL_CAPSULE | Freq: Three times a day (TID) | ORAL | Status: DC
  Administered 2022-04-10 – 2022-04-13 (×9): 100 mg via ORAL
  Filled 2022-04-10 (×9): qty 1

## 2022-04-10 MED ORDER — ACETAMINOPHEN 325 MG PO TABS
650.0000 mg | ORAL_TABLET | ORAL | Status: DC | PRN
  Administered 2022-04-11: 650 mg via ORAL
  Filled 2022-04-10: qty 2

## 2022-04-10 MED ORDER — IOHEXOL 350 MG/ML SOLN
100.0000 mL | Freq: Once | INTRAVENOUS | Status: AC | PRN
  Administered 2022-04-10: 100 mL via INTRAVENOUS

## 2022-04-10 MED ORDER — SODIUM CHLORIDE 0.9 % IV SOLN
1.0000 g | INTRAVENOUS | Status: DC
  Administered 2022-04-10: 1 g via INTRAVENOUS
  Filled 2022-04-10: qty 10

## 2022-04-10 MED ORDER — SODIUM CHLORIDE 0.9 % IV SOLN: INTRAVENOUS | Status: DC

## 2022-04-10 MED ORDER — METOCLOPRAMIDE HCL 5 MG/ML IJ SOLN
10.0000 mg | Freq: Four times a day (QID) | INTRAMUSCULAR | Status: AC
  Administered 2022-04-10 – 2022-04-11 (×5): 10 mg via INTRAVENOUS
  Filled 2022-04-10 (×5): qty 2

## 2022-04-10 MED ORDER — BUPROPION HCL ER (XL) 150 MG PO TB24: 150.0000 mg | ORAL_TABLET | Freq: Every day | ORAL | Status: DC

## 2022-04-10 MED ORDER — SODIUM CHLORIDE (PF) 0.9 % IJ SOLN
INTRAMUSCULAR | Status: AC
  Filled 2022-04-10: qty 50

## 2022-04-10 MED ORDER — BUPROPION HCL ER (XL) 300 MG PO TB24
300.0000 mg | ORAL_TABLET | Freq: Every day | ORAL | Status: DC
  Administered 2022-04-11 – 2022-04-13 (×3): 300 mg via ORAL
  Filled 2022-04-10 (×3): qty 1

## 2022-04-10 MED ORDER — DICYCLOMINE HCL 20 MG PO TABS
10.0000 mg | ORAL_TABLET | Freq: Three times a day (TID) | ORAL | Status: DC | PRN
  Administered 2022-04-11: 10 mg via ORAL
  Filled 2022-04-10: qty 1

## 2022-04-10 MED ORDER — LEVOTHYROXINE SODIUM 50 MCG PO TABS
50.0000 ug | ORAL_TABLET | Freq: Every day | ORAL | Status: DC
  Administered 2022-04-11 – 2022-04-13 (×3): 50 ug via ORAL
  Filled 2022-04-10 (×3): qty 1

## 2022-04-10 MED ORDER — ONDANSETRON HCL 4 MG/2ML IJ SOLN
4.0000 mg | Freq: Once | INTRAMUSCULAR | Status: AC
  Administered 2022-04-10: 4 mg via INTRAVENOUS
  Filled 2022-04-10: qty 2

## 2022-04-10 MED ORDER — BUPROPION HCL ER (XL) 150 MG PO TB24
150.0000 mg | ORAL_TABLET | Freq: Every day | ORAL | Status: DC
  Administered 2022-04-10 – 2022-04-12 (×3): 150 mg via ORAL
  Filled 2022-04-10 (×3): qty 1

## 2022-04-10 MED ORDER — KETOROLAC TROMETHAMINE 15 MG/ML IJ SOLN
15.0000 mg | Freq: Once | INTRAMUSCULAR | Status: AC
  Administered 2022-04-10: 15 mg via INTRAVENOUS
  Filled 2022-04-10: qty 1

## 2022-04-10 MED ORDER — IPRATROPIUM-ALBUTEROL 0.5-2.5 (3) MG/3ML IN SOLN
3.0000 mL | Freq: Four times a day (QID) | RESPIRATORY_TRACT | Status: DC | PRN
  Administered 2022-04-11 – 2022-04-12 (×3): 3 mL via RESPIRATORY_TRACT
  Filled 2022-04-10 (×3): qty 3

## 2022-04-10 MED ORDER — CHLORHEXIDINE GLUCONATE CLOTH 2 % EX PADS
6.0000 | MEDICATED_PAD | Freq: Every day | CUTANEOUS | Status: DC
  Administered 2022-04-11 – 2022-04-13 (×3): 6 via TOPICAL

## 2022-04-10 MED ORDER — LORAZEPAM 1 MG PO TABS
2.0000 mg | ORAL_TABLET | Freq: Every evening | ORAL | Status: DC | PRN
  Administered 2022-04-12: 2 mg via ORAL
  Filled 2022-04-10: qty 2

## 2022-04-10 MED ORDER — FAMOTIDINE 20 MG PO TABS
20.0000 mg | ORAL_TABLET | Freq: Every day | ORAL | Status: DC
  Administered 2022-04-10 – 2022-04-12 (×3): 20 mg via ORAL
  Filled 2022-04-10 (×3): qty 1

## 2022-04-10 NOTE — ED Notes (Signed)
This Probation officer and pt's RN were at bedside with pt and her husband present. This Probation officer overheard patient discussing the location of some of her rings stating " I believe they must be at the house. That's the only logical place they could be." Patient's husband then states " well I will go look at the house for them. If they are not here then I guess I will have to come up here and shoot somebody." This writer looks at patient's husband and states "no sir, we don't do that." Patient's husband then states " No, like shoot 'em in the leg or something." This Probation officer again states " No sir, we don't say that. Nobody is getting shot." Patient then looks at her husband and states " That is not nice. Stop it. " Security and charge nurse made aware of same. Receiving RN on [address] also made aware.

## 2022-04-10 NOTE — ED Notes (Signed)
ED TO INPATIENT HANDOFF REPORT  ED Nurse Name and Phone #: Waynette Buttery Name/Age/Gender [name] 67 y.o. female Room/Bed: WA12/WA12  Code Status   Code Status: Prior  Home/SNF/Other Home Patient oriented to: self, place, time, and situation Is this baseline? No   Triage Complete: Triage complete  Chief Complaint Abdominal pain [R10.9]  Triage Note Pt arrived via EMS from home complaining of SOB, and some abdominal pain which started 2 days ago. She is a lung cancer pt. .   Allergies Allergies  Allergen Reactions   Ciprofloxacin Hives   Levofloxacin Hives   Statins Other (See Comments)    Elevated liver enzymes   Tricor [Fenofibrate] Hives and Itching   Compazine [Prochlorperazine] Other (See Comments)    Tardive dyskinesia   Latex Other (See Comments)    Patient states "Messes with my skin."   Codeine Nausea And Vomiting   Metoprolol Other (See Comments)    Does not tolerate beta blockers 10/02/2013-pt states she can tolerate 25 mg and lower     Level of Care/Admitting Diagnosis ED Disposition     ED Disposition  Admit   Condition  --   Comment  Hospital Area: [place] [[id number]]  Level of Care: Med-Surg [16]  May place patient in observation at [facility] or [place] if equivalent level of care is available:: Yes  Covid Evaluation: Confirmed COVID Negative  Diagnosis: Abdominal pain [[id number]]  Admitting Physician: Lucienne Minks [[id number]]  Attending Physician: Lucienne Minks [[id number]]          B Medical/Surgery History Past Medical History:  Diagnosis Date   Anemia    "long time ago" (12/06/2012)   Anxiety    Arthritis    "right knee real bad; in my hands bad" (12/06/2012)   Asthma    Celiac disease    Colon polyps    COPD (chronic obstructive pulmonary disease) ([place])    Coronary artery disease    a. s/p Xience DES to [facility] 04/2008;  b. LHC 05/2008: Proximal RCA 25%, mid RCA stent patent.  c. Anormal nuc 2014 ->  s/p LHC with severe mRCA stenosis s/p DES. d. Cath 04/2013 s/p DES to LAD.  e. LHC 08/2015 was stable.   Depression    "years ago" (12/06/2012)   Fibromyalgia    Gallstones    GERD (gastroesophageal reflux disease)    H/O hiatal hernia    Hepatitis    "not A, B, or C" (12/06/2012)   Hyperlipidemia    intol to statins and other chol agents due to elevated LFTs   Hypertension    Hypothyroidism    IBS (irritable bowel syndrome)    Interstitial cystitis    Lung cancer ([place]) dx'd 01/2018   Lung nodules    Bilateral   Migraines    "when I was younger" (12/06/2012)   Pneumonia    PONV (postoperative nausea and vomiting)    "Very bad"   Premature atrial contractions    PVC's (premature ventricular contractions)    Sinus headache    SVT (supraventricular tachycardia)    Past Surgical History:  Procedure Laterality Date   CARDIAC CATHETERIZATION  04/2008; 05/2008   CARDIAC CATHETERIZATION N/A 08/07/2015   Procedure: Left Heart Cath and Coronary Angiography;  Surgeon: Sherren Mocha, MD;  Location: [place] [facility];  Service: Cardiovascular;  Laterality: N/A;   CHOLECYSTECTOMY     CORONARY ANGIOPLASTY WITH STENT PLACEMENT  04/2008 12/06/2012   "1 + 1" (12/06/2012)  CORONARY STENT PLACEMENT  05/08/2013   DES TO LAD      DR MCALHANY   IR IMAGING GUIDED PORT INSERTION  04/29/2018   LEFT HEART CATHETERIZATION WITH CORONARY ANGIOGRAM N/A 12/06/2012   Procedure: LEFT HEART CATHETERIZATION WITH CORONARY ANGIOGRAM;  Surgeon: [name], MD;  Location: [facility] CATH LAB;  Service: Cardiovascular;  Laterality: N/A;   LEFT HEART CATHETERIZATION WITH CORONARY ANGIOGRAM N/A 05/08/2013   Procedure: LEFT HEART CATHETERIZATION WITH CORONARY ANGIOGRAM;  Surgeon: [name], MD;  Location: [facility] CATH LAB;  Service: Cardiovascular;  Laterality: N/A;   LEFT HEART CATHETERIZATION WITH CORONARY ANGIOGRAM N/A 01/04/2014   Procedure: LEFT HEART CATHETERIZATION WITH CORONARY ANGIOGRAM;  Surgeon: [name], MD;  Location: [facility] CATH LAB;  Service: Cardiovascular;  Laterality: N/A;   PERCUTANEOUS CORONARY STENT INTERVENTION (PCI-S)  12/06/2012   Procedure: PERCUTANEOUS CORONARY STENT INTERVENTION (PCI-S);  Surgeon: [name], MD;  Location: [facility] CATH LAB;  Service: Cardiovascular;;   PERCUTANEOUS CORONARY STENT INTERVENTION (PCI-S)  05/08/2013   Procedure: PERCUTANEOUS CORONARY STENT INTERVENTION (PCI-S);  Surgeon: [name], MD;  Location: [facility] CATH LAB;  Service: Cardiovascular;;  mid LAD    TUBAL LIGATION     VAGINAL HYSTERECTOMY     VIDEO BRONCHOSCOPY WITH ENDOBRONCHIAL NAVIGATION N/A 02/17/2018   Procedure: VIDEO BRONCHOSCOPY WITH ENDOBRONCHIAL NAVIGATION;  Surgeon: Melrose Nakayama, MD;  Location: [place];  Service: Thoracic;  Laterality: N/A;     A IV Location/Drains/Wounds Patient Lines/Drains/Airways Status     Active Line/Drains/Airways     Name Placement date Placement time Site Days   Implanted Port 04/29/18 Right Chest 04/29/18  1356  Chest  1442   Peripheral IV 04/10/22 22 G Anterior;Left Forearm 04/10/22  0951  Forearm  less than 1            Intake/Output Last 24 hours No intake or output data in the 24 hours ending 04/10/22 1553  Labs/Imaging Results for orders placed or performed during the hospital encounter of 04/10/22 (from the past 48 hour(s))  Lactic acid, plasma     Status: None   Collection Time: 04/10/22 10:09 AM  Result Value Ref Range   Lactic Acid, Venous 1.2 0.5 - 1.9 mmol/L    Comment: Performed at [facility], [place] [address], [place], [place] [address]  Ammonia     Status: None   Collection Time: 04/10/22 10:09 AM  Result Value Ref Range   Ammonia 14 9 - 35 umol/L    Comment: Performed at [facility], [place] [address], [place], [place] [address]  TSH     Status: Abnormal   Collection Time: 04/10/22 10:09 AM  Result Value Ref Range   TSH 5.761 (H) 0.350 - 4.500 uIU/mL     Comment: Performed by a 3rd Generation assay with a functional sensitivity of <=0.01 uIU/mL. Performed at [facility], [place] [address], [place], [place] [address]   CBC with Differential     Status: Abnormal   Collection Time: 04/10/22 10:12 AM  Result Value Ref Range   WBC 5.0 4.0 - 10.5 K/uL   RBC 3.00 (L) 3.87 - 5.11 MIL/uL   Hemoglobin 9.0 (L) 12.0 - 15.0 g/dL   HCT 28.9 (L) 36.0 - 46.0 %   MCV 96.3 80.0 - 100.0 fL   MCH 30.0 26.0 - 34.0 pg   MCHC 31.1 30.0 - 36.0 g/dL   RDW 15.6 (H) 11.5 - 15.5 %   Platelets 296  150 - 400 K/uL   nRBC 0.0 0.0 - 0.2 %   Neutrophils Relative % 56 %   Neutro Abs 2.8 1.7 - 7.7 K/uL   Lymphocytes Relative 20 %   Lymphs Abs 1.0 0.7 - 4.0 K/uL   Monocytes Relative 13 %   Monocytes Absolute 0.7 0.1 - 1.0 K/uL   Eosinophils Relative 9 %   Eosinophils Absolute 0.4 0.0 - 0.5 K/uL   Basophils Relative 1 %   Basophils Absolute 0.1 0.0 - 0.1 K/uL   Immature Granulocytes 1 %   Abs Immature Granulocytes 0.04 0.00 - 0.07 K/uL    Comment: Performed at [facility], [place] [address] [place], [place] [address]  Comprehensive metabolic panel     Status: Abnormal   Collection Time: 04/10/22 10:12 AM  Result Value Ref Range   Sodium 138 135 - 145 mmol/L   Potassium 3.5 3.5 - 5.1 mmol/L   Chloride 103 98 - 111 mmol/L   CO2 25 22 - 32 mmol/L   Glucose, Bld 153 (H) 70 - 99 mg/dL    Comment: Glucose reference range applies only to samples taken after fasting for at least 8 hours.   BUN 18 8 - 23 mg/dL   Creatinine, Ser 1.19 (H) 0.44 - 1.00 mg/dL   Calcium 8.6 (L) 8.9 - 10.3 mg/dL   Total Protein 6.4 (L) 6.5 - 8.1 g/dL   Albumin 3.1 (L) 3.5 - 5.0 g/dL   AST 20 15 - 41 U/L   ALT 21 0 - 44 U/L   Alkaline Phosphatase 90 38 - 126 U/L   Total Bilirubin 0.7 0.3 - 1.2 mg/dL   GFR, Estimated 50 (L) >60 mL/min    Comment: (NOTE) Calculated using the CKD-EPI Creatinine Equation (2021)    Anion gap 10 5 - 15    Comment: Performed  at [facility], [place] [address], [place], [place] [address]  Lipase, blood     Status: None   Collection Time: 04/10/22 10:12 AM  Result Value Ref Range   Lipase 26 11 - 51 U/L    Comment: Performed at [facility], [place] [address], [place], [place] [address]  Protime-INR     Status: None   Collection Time: 04/10/22 10:12 AM  Result Value Ref Range   Prothrombin Time 13.2 11.4 - 15.2 seconds   INR 1.0 0.8 - 1.2    Comment: (NOTE) INR goal varies based on device and disease states. Performed at [facility], [place] [address], [place],  [address]   Urinalysis, Routine w reflex microscopic -Urine, Clean Catch     Status: Abnormal   Collection Time: 04/10/22 10:28 AM  Result Value Ref Range   Color, Urine YELLOW YELLOW   APPearance CLEAR CLEAR   Specific Gravity, Urine 1.011 1.005 - 1.030   pH 6.0 5.0 - 8.0   Glucose, UA NEGATIVE NEGATIVE mg/dL   Hgb urine dipstick SMALL (A) NEGATIVE   Bilirubin Urine NEGATIVE NEGATIVE   Ketones, ur NEGATIVE NEGATIVE mg/dL   Protein, ur NEGATIVE NEGATIVE mg/dL   Nitrite NEGATIVE NEGATIVE   Leukocytes,Ua NEGATIVE NEGATIVE   RBC / HPF 0-5 0 - 5 RBC/hpf   WBC, UA 0-5 0 - 5 WBC/hpf   Bacteria, UA RARE (A) NONE SEEN   Squamous Epithelial / HPF 0-5 0 - 5 /HPF   Mucus PRESENT     Comment: Performed at [facility], [place] [address] [place],  [address]  Blood gas, arterial (at [facility])     Status: Abnormal   Collection Time: 04/10/22 10:51 AM  Result Value Ref Range   pH, Arterial 7.48 (H) 7.35 - 7.45   pCO2 arterial 39 32 - 48 mmHg   pO2, Arterial 87 83 - 108 mmHg   Bicarbonate 29.0 (H) 20.0 - 28.0 mmol/L   Acid-Base Excess 5.1 (H) 0.0 - 2.0 mmol/L   O2 Saturation 99 %   Patient temperature 37.0    Collection site RIGHT RADIAL    Drawn by [id number]    Allens test (pass/fail) PASS PASS    Comment: Performed at [facility], [place] [address]  [place], [place] [address]  Resp panel by RT-PCR (RSV, Flu A&B, Covid) Anterior Nasal Swab     Status: None   Collection Time: 04/10/22  1:36 PM   Specimen: Anterior Nasal Swab  Result Value Ref Range   SARS Coronavirus 2 by RT PCR NEGATIVE NEGATIVE    Comment: (NOTE) SARS-CoV-2 target nucleic acids are NOT DETECTED.  The SARS-CoV-2 RNA is generally detectable in upper respiratory specimens during the acute phase of infection. The lowest concentration of SARS-CoV-2 viral copies this assay can detect is 138 copies/mL. A negative result does not preclude SARS-Cov-2 infection and should not be used as the sole basis for treatment or other patient management decisions. A negative result may occur with  improper specimen collection/handling, submission of specimen other than nasopharyngeal swab, presence of viral mutation(s) within the areas targeted by this assay, and inadequate number of viral copies(<138 copies/mL). A negative result must be combined with clinical observations, patient history, and epidemiological information. The expected result is Negative.  Fact Sheet for Patients:  EntrepreneurPulse.com.au  Fact Sheet for Healthcare Providers:  IncredibleEmployment.be  This test is no t yet approved or cleared by the [place] FDA and  has been authorized for detection and/or diagnosis of SARS-CoV-2 by FDA under an Emergency Use Authorization (EUA). This EUA will remain  in effect (meaning this test can be used) for the duration of the COVID-19 declaration under Section 564(b)(1) of the Act, 21 U.S.C.section 360bbb-3(b)(1), unless the authorization is terminated  or revoked sooner.       Influenza A by PCR NEGATIVE NEGATIVE   Influenza B by PCR NEGATIVE NEGATIVE    Comment: (NOTE) The Xpert Xpress SARS-CoV-2/FLU/RSV plus assay is intended as an aid in the diagnosis of influenza from Nasopharyngeal swab specimens and should not be used as a  sole basis for treatment. Nasal washings and aspirates are unacceptable for Xpert Xpress SARS-CoV-2/FLU/RSV testing.  Fact Sheet for Patients: EntrepreneurPulse.com.au  Fact Sheet for Healthcare Providers: IncredibleEmployment.be  This test is not yet approved or cleared by the [place] FDA and has been authorized for detection and/or diagnosis of SARS-CoV-2 by FDA under an Emergency Use Authorization (EUA). This EUA will remain in effect (meaning this test can be used) for the duration of the COVID-19 declaration under Section 564(b)(1) of the Act, 21 U.S.C. section 360bbb-3(b)(1), unless the authorization is terminated or revoked.     Resp Syncytial Virus by PCR NEGATIVE NEGATIVE    Comment: (NOTE) Fact Sheet for Patients: EntrepreneurPulse.com.au  Fact Sheet for Healthcare Providers: IncredibleEmployment.be  This test is not yet approved or cleared by the [place] FDA and has been authorized for detection and/or diagnosis of SARS-CoV-2 by FDA under an Emergency Use Authorization (EUA). This EUA will remain in effect (meaning this test can be used) for the duration  of the COVID-19 declaration under Section 564(b)(1) of the Act, 21 U.S.C. section 360bbb-3(b)(1), unless the authorization is terminated or revoked.  Performed at [facility], [place] [address], [place], [place] [address]    MR BRAIN WO CONTRAST  Result Date: 04/10/2022 CLINICAL DATA:  Mental status change, unknown cause. EXAM: MRI HEAD WITHOUT CONTRAST TECHNIQUE: Multiplanar, multiecho pulse sequences of the brain and surrounding structures were obtained without intravenous contrast. COMPARISON:  MRI brain 03/06/2022. FINDINGS: Brain: Motion degraded study. No acute infarct or hemorrhage. No mass or midline shift. Few areas of T2 hyperintensity in the cerebral white matter unchanged and likely due to chronic  small-vessel disease. No hydrocephalus or extra-axial collection. Vascular: Normal flow voids. Skull and upper cervical spine: Normal marrow signal. Sinuses/Orbits: Mucous retention cyst in the left maxillary sinus. Other: None. IMPRESSION: No acute intracranial abnormality or mass. Electronically Signed   By: Emmit Alexanders M.D.   On: 04/10/2022 15:28   CT Angio Chest PE W and/or Wo Contrast  Result Date: 04/10/2022 CLINICAL DATA:  Pulmonary embolism (PE) suspected, low to intermediate prob, positive D-dimer; Abdominal pain, acute, nonlocalized. Mental status change. Dyspnea. History of lung cancer. * Tracking Code: BO * EXAM: CT ANGIOGRAPHY CHEST CT ABDOMEN AND PELVIS WITH CONTRAST TECHNIQUE: Multidetector CT imaging of the chest was performed using the standard protocol during bolus administration of intravenous contrast. Multiplanar CT image reconstructions and MIPs were obtained to evaluate the vascular anatomy. Multidetector CT imaging of the abdomen and pelvis was performed using the standard protocol during bolus administration of intravenous contrast. RADIATION DOSE REDUCTION: This exam was performed according to the departmental dose-optimization program which includes automated exposure control, adjustment of the mA and/or kV according to patient size and/or use of iterative reconstruction technique. CONTRAST:  153mL OMNIPAQUE IOHEXOL 350 MG/ML SOLN COMPARISON:  03/20/2022 chest CT. 02/12/2022 PET-CT. 10/04/2018 CT abdomen/pelvis. FINDINGS: CTA CHEST FINDINGS Cardiovascular: The study is moderate quality for the evaluation of pulmonary embolism, limited by significant motion degradation. There are no filling defects in the central, lobar, segmental or subsegmental pulmonary artery branches to suggest acute pulmonary embolism. Great vessels are normal in course and caliber. Normal heart size. No significant pericardial fluid/thickening. Left anterior descending and right coronary  atherosclerosis/stents. Right internal jugular Port-A-Cath terminates at the cavoatrial junction. Mediastinum/Nodes: No significant thyroid nodules. Unremarkable esophagus. No pathologically enlarged axillary, mediastinal or hilar lymph nodes. Lungs/Pleura: No pneumothorax. No pleural effusion. Severe centrilobular emphysema. Sub solid 2.3 x 1.7 cm peripheral right upper lobe pulmonary nodule (series 11/image 54), stable from recent 03/20/2022 chest CT using similar measurement technique. Sub solid 1.4 x 1.1 cm superior segment left lower lobe nodule (series 11/image 44), stable. No acute consolidative airspace disease or new significant pulmonary nodules. Musculoskeletal: No aggressive appearing focal osseous lesions. Mild thoracic spondylosis. Chronic mild T5 vertebral compression fracture. Review of the MIP images confirms the above findings. CT ABDOMEN and PELVIS FINDINGS Hepatobiliary: Normal liver with no liver mass. Cholecystectomy. No biliary ductal dilatation. Pancreas: Normal, with no mass or duct dilation. Spleen: Normal size. No mass. Adrenals/Urinary Tract: Normal adrenals. Normal kidneys with no hydronephrosis and no renal mass. Normal bladder. Stomach/Bowel: Normal non-distended stomach. Normal caliber small bowel with no small bowel wall thickening. Normal appendix. Scattered mild colonic diverticulosis with no large bowel wall thickening or significant pericolonic fat stranding. Vascular/Lymphatic: Atherosclerotic nonaneurysmal abdominal aorta. Patent portal, splenic, hepatic and renal veins. No pathologically enlarged lymph nodes in the abdomen or pelvis. Reproductive: Status post hysterectomy, with no abnormal  findings at the vaginal cuff. Simple 3.0 cm left adnexal cyst (series 5/image 75), new from 02/12/2022 PET-CT. No right adnexal mass. Other: No pneumoperitoneum, ascites or focal fluid collection. Musculoskeletal: No aggressive appearing focal osseous lesions. Marked degenerative disc  disease at L5-S1. Review of the MIP images confirms the above findings. IMPRESSION: 1. Limited motion degraded scan.  No evidence of pulmonary embolism. 2. Subsolid 2.3 cm peripheral right upper lobe and 1.4 cm superior segment left lower lobe pulmonary nodules, unchanged from recent 03/20/2022 chest CT. Multifocal pulmonary malignancy not excluded. No thoracic adenopathy. No evidence of metastatic disease in the abdomen or pelvis. 3. No acute pulmonary disease. No acute abnormality in the abdomen or pelvis. No evidence of bowel obstruction or acute bowel inflammation. Mild colonic diverticulosis. 4. Simple 3.0 cm left adnexal cyst, new from 02/12/2022 PET-CT. No follow-up imaging is recommended. Reference: JACR 2020 Feb;17(2):248-254 5. Aortic Atherosclerosis (ICD10-I70.0) and Emphysema (ICD10-J43.9). Electronically Signed   By: Ilona Sorrel M.D.   On: 04/10/2022 12:25   CT ABDOMEN PELVIS W CONTRAST  Result Date: 04/10/2022 CLINICAL DATA:  Pulmonary embolism (PE) suspected, low to intermediate prob, positive D-dimer; Abdominal pain, acute, nonlocalized. Mental status change. Dyspnea. History of lung cancer. * Tracking Code: BO * EXAM: CT ANGIOGRAPHY CHEST CT ABDOMEN AND PELVIS WITH CONTRAST TECHNIQUE: Multidetector CT imaging of the chest was performed using the standard protocol during bolus administration of intravenous contrast. Multiplanar CT image reconstructions and MIPs were obtained to evaluate the vascular anatomy. Multidetector CT imaging of the abdomen and pelvis was performed using the standard protocol during bolus administration of intravenous contrast. RADIATION DOSE REDUCTION: This exam was performed according to the departmental dose-optimization program which includes automated exposure control, adjustment of the mA and/or kV according to patient size and/or use of iterative reconstruction technique. CONTRAST:  130mL OMNIPAQUE IOHEXOL 350 MG/ML SOLN COMPARISON:  03/20/2022 chest CT. 02/12/2022  PET-CT. 10/04/2018 CT abdomen/pelvis. FINDINGS: CTA CHEST FINDINGS Cardiovascular: The study is moderate quality for the evaluation of pulmonary embolism, limited by significant motion degradation. There are no filling defects in the central, lobar, segmental or subsegmental pulmonary artery branches to suggest acute pulmonary embolism. Great vessels are normal in course and caliber. Normal heart size. No significant pericardial fluid/thickening. Left anterior descending and right coronary atherosclerosis/stents. Right internal jugular Port-A-Cath terminates at the cavoatrial junction. Mediastinum/Nodes: No significant thyroid nodules. Unremarkable esophagus. No pathologically enlarged axillary, mediastinal or hilar lymph nodes. Lungs/Pleura: No pneumothorax. No pleural effusion. Severe centrilobular emphysema. Sub solid 2.3 x 1.7 cm peripheral right upper lobe pulmonary nodule (series 11/image 54), stable from recent 03/20/2022 chest CT using similar measurement technique. Sub solid 1.4 x 1.1 cm superior segment left lower lobe nodule (series 11/image 44), stable. No acute consolidative airspace disease or new significant pulmonary nodules. Musculoskeletal: No aggressive appearing focal osseous lesions. Mild thoracic spondylosis. Chronic mild T5 vertebral compression fracture. Review of the MIP images confirms the above findings. CT ABDOMEN and PELVIS FINDINGS Hepatobiliary: Normal liver with no liver mass. Cholecystectomy. No biliary ductal dilatation. Pancreas: Normal, with no mass or duct dilation. Spleen: Normal size. No mass. Adrenals/Urinary Tract: Normal adrenals. Normal kidneys with no hydronephrosis and no renal mass. Normal bladder. Stomach/Bowel: Normal non-distended stomach. Normal caliber small bowel with no small bowel wall thickening. Normal appendix. Scattered mild colonic diverticulosis with no large bowel wall thickening or significant pericolonic fat stranding. Vascular/Lymphatic: Atherosclerotic  nonaneurysmal abdominal aorta. Patent portal, splenic, hepatic and renal veins. No pathologically enlarged lymph nodes in the abdomen or  pelvis. Reproductive: Status post hysterectomy, with no abnormal findings at the vaginal cuff. Simple 3.0 cm left adnexal cyst (series 5/image 75), new from 02/12/2022 PET-CT. No right adnexal mass. Other: No pneumoperitoneum, ascites or focal fluid collection. Musculoskeletal: No aggressive appearing focal osseous lesions. Marked degenerative disc disease at L5-S1. Review of the MIP images confirms the above findings. IMPRESSION: 1. Limited motion degraded scan.  No evidence of pulmonary embolism. 2. Subsolid 2.3 cm peripheral right upper lobe and 1.4 cm superior segment left lower lobe pulmonary nodules, unchanged from recent 03/20/2022 chest CT. Multifocal pulmonary malignancy not excluded. No thoracic adenopathy. No evidence of metastatic disease in the abdomen or pelvis. 3. No acute pulmonary disease. No acute abnormality in the abdomen or pelvis. No evidence of bowel obstruction or acute bowel inflammation. Mild colonic diverticulosis. 4. Simple 3.0 cm left adnexal cyst, new from 02/12/2022 PET-CT. No follow-up imaging is recommended. Reference: JACR 2020 Feb;17(2):248-254 5. Aortic Atherosclerosis (ICD10-I70.0) and Emphysema (ICD10-J43.9). Electronically Signed   By: Ilona Sorrel M.D.   On: 04/10/2022 12:25   CT Head Wo Contrast  Result Date: 04/10/2022 CLINICAL DATA:  Mental status change, unknown cause. EXAM: CT HEAD WITHOUT CONTRAST TECHNIQUE: Contiguous axial images were obtained from the base of the skull through the vertex without intravenous contrast. RADIATION DOSE REDUCTION: This exam was performed according to the departmental dose-optimization program which includes automated exposure control, adjustment of the mA and/or kV according to patient size and/or use of iterative reconstruction technique. COMPARISON:  Head CT 10/11/2019. FINDINGS: Brain: No acute  hemorrhage, mass effect or midline shift. Gray-white differentiation is preserved. No hydrocephalus. No extra-axial collection. Basilar cisterns are patent. Vascular: No hyperdense vessel or unexpected calcification. Skull: No calvarial fracture or suspicious bone lesion. Skull base is unremarkable. Sinuses/Orbits: Unremarkable. Other: None. IMPRESSION: No acute intracranial abnormality. Electronically Signed   By: Emmit Alexanders M.D.   On: 04/10/2022 12:07   DG Chest Portable 1 View  Result Date: 04/10/2022 CLINICAL DATA:  Shortness of breath. EXAM: PORTABLE CHEST 1 VIEW COMPARISON:  03/15/2022 FINDINGS: Right jugular Port-A-Cath is stable with the tip near the superior cavoatrial junction. Heart size is within normal limits and stable. Densities near the left costophrenic angle appear to be chronic. No new airspace disease or pulmonary edema. Negative for a pneumothorax. IMPRESSION: No active disease. Electronically Signed   By: Markus Daft M.D.   On: 04/10/2022 10:05    Pending Labs Unresulted Labs (From admission, onward)     Start     Ordered   04/11/22 0500  CBC  Tomorrow morning,   R        04/10/22 1454   04/11/22 0500  Comprehensive metabolic panel  Tomorrow morning,   R        04/10/22 1454   04/11/22 0500  Magnesium  Tomorrow morning,   R        04/10/22 1454   04/11/22 0500  Phosphorus  Tomorrow morning,   R        04/10/22 1454   04/11/22 0500  C-reactive protein  Tomorrow morning,   R        04/10/22 1454   04/11/22 0500  Hemoglobin A1c  Tomorrow morning,   R        04/10/22 1454   04/10/22 1439  Culture, blood (Routine X 2) w Reflex to ID Panel  BLOOD CULTURE X 2,   R (with TIMED occurrences)      04/10/22 1438   04/10/22 0174  Lactic acid, plasma  Now then every 2 hours,   R (with STAT occurrences)      04/10/22 0949            Vitals/Pain Today's Vitals   04/10/22 0944 04/10/22 0945 04/10/22 0950 04/10/22 1415  BP:    129/68  Pulse:  95  87  Resp:    (!) 24   Temp:   99 F (37.2 C) 98.2 F (36.8 C)  TempSrc:   Oral Oral  SpO2:  98%  98%  PainSc: 6        Isolation Precautions Airborne and Contact precautions  Medications Medications  sodium chloride (PF) 0.9 % injection (has no administration in time range)  0.9 %  sodium chloride infusion (has no administration in time range)  cefTRIAXone (ROCEPHIN) 1 g in sodium chloride 0.9 % 100 mL IVPB (has no administration in time range)  metoCLOPramide (REGLAN) injection 10 mg (has no administration in time range)  levothyroxine (SYNTHROID) tablet 50 mcg (has no administration in time range)  ipratropium-albuterol (DUONEB) 0.5-2.5 (3) MG/3ML nebulizer solution 3 mL (has no administration in time range)  aspirin EC tablet 81 mg (has no administration in time range)  buPROPion (WELLBUTRIN XL) 24 hr tablet 150 mg (has no administration in time range)  famotidine (PEPCID) tablet 20 mg (has no administration in time range)  gabapentin (NEURONTIN) capsule 100 mg (has no administration in time range)  LORazepam (ATIVAN) tablet 2 mg (has no administration in time range)  iohexol (OMNIPAQUE) 350 MG/ML injection 100 mL (100 mLs Intravenous Contrast Given 04/10/22 1147)  sodium chloride 0.9 % bolus 500 mL (0 mLs Intravenous Stopped 04/10/22 1334)  ketorolac (TORADOL) 15 MG/ML injection 15 mg (15 mg Intravenous Given 04/10/22 1223)  ondansetron (ZOFRAN) injection 4 mg (4 mg Intravenous Given 04/10/22 1424)    Mobility walks     Focused Assessments Cardiac Assessment Handoff:    Lab Results  Component Value Date   CKTOTAL 32 (L) 03/19/2022   CKMB 2.7 06/12/2008   TROPONINI <0.01        NO INDICATION OF MYOCARDIAL INJURY. 06/12/2008   Lab Results  Component Value Date   DDIMER 1.45 (H) 01/10/2018   Does the Patient currently have chest pain? No    R Recommendations: See Admitting Provider Note  Report given to:   Additional Notes:

## 2022-04-10 NOTE — Progress Notes (Signed)
This RNCM received message from Amy with [facility] who reports the Lat[facility]ft is following the patient for PT/OT.   TOC is not currently following the patient for any needs.

## 2022-04-10 NOTE — ED Provider Notes (Signed)
EMERGENCY DEPARTMENT AT [facility] Provider Note   CSN: [id number] Arrival date & time: 04/10/22  8413     History  Chief Complaint  Patient presents with   Shortness of Breath   Abdominal Pain    [name] is a 67 y.o. female.  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  She appears to have some confusion.  States pain has been constant for the past 2 days without any nausea or vomiting.  Is on oxygen chronically secondary to her lung cancer denies any increased work of breathing, cough or fever.  No chest pain.  She believes she takes Coumadin but is not sure.  The abdominal pain has been constant without radiation.  No pain with urination or blood in the urine.  She is never had this kind of pain in the past.  Denies any history of ulcers or acid reflux. Still has appendix and gallbladder. Does not think that her breathing is any worse than baseline.  The history is provided by the patient and the EMS personnel. The history is limited by the condition of the patient.  Shortness of Breath Associated symptoms: abdominal pain   Associated symptoms: no chest pain, no fever, no headaches and no rash   Abdominal Pain Associated symptoms: nausea and shortness of breath   Associated symptoms: no chest pain, no dysuria, no fever and no hematuria        Home Medications Prior to Admission medications   Medication Sig Start Date End Date Taking? Authorizing Provider  adagrasib (KRAZATI) 200 MG tablet Take 2 tablets (400 mg total) by mouth 2 (two) times daily. 03/13/22   Heilingoetter, Cassandra L, PA-C  albuterol (PROVENTIL) (5 MG/ML) 0.5% nebulizer solution Take 2.5 mg by nebulization every 6 (six) hours as needed for wheezing.    [provider]  albuterol (VENTOLIN HFA) 108 (90 Base) MCG/ACT inhaler Inhale 1 puff into the lungs every 6 (six) hours as needed (wheezing/shortness of breath.).     [provider]  aspirin 81 MG chewable tablet Chew 1 tablet (81 mg total) by mouth daily. 12/07/12   Barrett, Evelene Croon, PA-C  benzonatate (TESSALON) 200 MG capsule Take 1 capsule (200 mg total) by mouth 3 (three) times daily as needed for cough. 03/31/22   Pokhrel, Corrie Mckusick, MD  Budeson-Glycopyrrol-Formoterol (BREZTRI AEROSPHERE) 160-9-4.8 MCG/ACT AERO Inhale 2 puffs into the lungs in the morning and at bedtime. 10/14/21   Icard, Octavio Graves, DO  buPROPion (WELLBUTRIN XL) 150 MG 24 hr tablet Take 150 mg by mouth daily. 03/05/22   [provider]  cyanocobalamin 1000 MCG tablet Take 1 tablet (1,000 mcg total) by mouth daily. 03/31/22 07/09/22  Pokhrel, Corrie Mckusick, MD  famotidine (PEPCID) 10 MG tablet Take 1 tablet (10 mg total) by mouth at bedtime. 03/31/22 04/30/22  Pokhrel, Corrie Mckusick, MD  gabapentin (NEURONTIN) 100 MG capsule Take 1 capsule (100 mg total) by mouth 3 (three) times daily for 15 days. 03/31/22 04/15/22  Pokhrel, Corrie Mckusick, MD  HYDROcodone bit-homatropine (HYCODAN) 5-1.5 MG/5ML syrup Take 5 mLs by mouth every 6 (six) hours as needed for cough. 03/11/22   Heilingoetter, Cassandra L, PA-C  ipratropium-albuterol (DUONEB) 0.5-2.5 (3) MG/3ML SOLN Inhale 3 mLs into the lungs every 6 (six) hours as needed (Asthma). 01/27/21  Parrett, Tammy S, NP  LORazepam (ATIVAN) 2 MG tablet Take 2 mg by mouth every evening.    [provider]  metFORMIN (GLUCOPHAGE-XR) 500 MG 24 hr tablet Take 1 tablet (500 mg total) by mouth daily with breakfast. 03/31/22   Pokhrel, Corrie Mckusick, MD  metoprolol tartrate (LOPRESSOR) 25 MG tablet TAKE 1/2 TABLET(12.5 MG) BY MOUTH TWICE DAILY Patient taking differently: Take 12.5 mg by mouth 2 (two) times daily. 06/25/20   [name], MD  nitroGLYCERIN (NITROSTAT) 0.4 MG SL tablet Place 1 tablet (0.4 mg  total) under the tongue every 5 (five) minutes as needed for chest pain. 12/07/12   Barrett, Evelene Croon, PA-C  ondansetron (ZOFRAN-ODT) 4 MG disintegrating tablet Take 1 tablet (4 mg total) by mouth every 8 (eight) hours as needed for nausea or vomiting. 02/16/22   Heilingoetter, Cassandra L, PA-C  senna-docusate (SENOKOT-S) 8.6-50 MG tablet Take 1 tablet by mouth at bedtime as needed for mild constipation. 03/31/22 04/30/22  Pokhrel, Corrie Mckusick, MD  Vitamin D, Ergocalciferol, (DRISDOL) 1.25 MG (50000 UT) CAPS capsule Take 50,000 Units by mouth every 7 (seven) days. Wednesdays 02/12/19   [provider]      Allergies    Ciprofloxacin, Levofloxacin, Statins, Tricor [fenofibrate], Compazine [prochlorperazine], Latex, Codeine, and Metoprolol    Review of Systems   Review of Systems  Constitutional:  Negative for activity change, appetite change and fever.  HENT:  Negative for congestion and rhinorrhea.   Respiratory:  Positive for shortness of breath. Negative for chest tightness.   Cardiovascular:  Negative for chest pain and palpitations.  Gastrointestinal:  Positive for abdominal pain and nausea.  Genitourinary:  Negative for dysuria and hematuria.  Musculoskeletal:  Negative for arthralgias and myalgias.  Skin:  Negative for rash.  Neurological:  Negative for dizziness, weakness and headaches.   all other systems are negative except as noted in the HPI and PMH.    Physical Exam Updated Vital Signs Pulse 95   Temp 99 F (37.2 C) (Oral)   SpO2 98% Comment: 2L oxygen Physical Exam Vitals and nursing note reviewed.  Constitutional:      General: She is not in acute distress.    Appearance: She is well-developed.     Comments: Confused, slow to respond, oriented to person and place  HENT:     Head: Normocephalic and atraumatic.     Mouth/Throat:     Pharynx: No oropharyngeal exudate.  Eyes:     Conjunctiva/sclera: Conjunctivae normal.     Pupils: Pupils are equal, round, and  reactive to light.  Neck:     Comments: No meningismus. Cardiovascular:     Rate and Rhythm: Normal rate and regular rhythm.     Heart sounds: Normal heart sounds. No murmur heard. Pulmonary:     Effort: Pulmonary effort is normal. No respiratory distress.     Breath sounds: Normal breath sounds.  Abdominal:     Palpations: Abdomen is soft.     Tenderness: There is abdominal tenderness. There is no guarding or rebound.     Comments: Left upper quadrant tenderness with voluntary guarding  Musculoskeletal:        General: No tenderness. Normal range of motion.     Cervical back: Normal range of motion and neck supple.  Skin:    General: Skin is warm.  Neurological:     General: No focal deficit present.     Mental Status: She is alert. Mental status is at baseline.     Cranial  Nerves: No cranial nerve deficit.     Motor: No abnormal muscle tone.     Coordination: Coordination normal.     Comments: Slow to respond, oriented to person and place.  5/5 strength throughout, cranial nerves II to XII intact, no facial droop. Moves all extremities equally to command  Psychiatric:        Behavior: Behavior normal.     ED Results / Procedures / Treatments   Labs (all labs ordered are listed, but only abnormal results are displayed) Labs Reviewed  CBC WITH DIFFERENTIAL/PLATELET - Abnormal; Notable for the following components:      Result Value   RBC 3.00 (*)    Hemoglobin 9.0 (*)    HCT 28.9 (*)    RDW 15.6 (*)    All other components within normal limits  COMPREHENSIVE METABOLIC PANEL - Abnormal; Notable for the following components:   Glucose, Bld 153 (*)    Creatinine, Ser 1.19 (*)    Calcium 8.6 (*)    Total Protein 6.4 (*)    Albumin 3.1 (*)    GFR, Estimated 50 (*)    All other components within normal limits  URINALYSIS, ROUTINE W REFLEX MICROSCOPIC - Abnormal; Notable for the following components:   Hgb urine dipstick SMALL (*)    Bacteria, UA RARE (*)    All other  components within normal limits  TSH - Abnormal; Notable for the following components:   TSH 5.761 (*)    All other components within normal limits  BLOOD GAS, ARTERIAL - Abnormal; Notable for the following components:   pH, Arterial 7.48 (*)    Bicarbonate 29.0 (*)    Acid-Base Excess 5.1 (*)    All other components within normal limits  RESP PANEL BY RT-PCR (RSV, FLU A&B, COVID)  RVPGX2  LIPASE, BLOOD  LACTIC ACID, PLASMA  PROTIME-INR  AMMONIA  LACTIC ACID, PLASMA    EKG EKG Interpretation  Date/Time:  Friday April 10 2022 09:51:26 EST Ventricular Rate:  90 PR Interval:  151 QRS Duration: 77 QT Interval:  371 QTC Calculation: 454 R Axis:   70 Text Interpretation: Sinus rhythm Atrial premature complex No significant change was found Confirmed by Ezequiel Essex [phone]) on 04/10/2022 9:58:45 AM  Radiology CT Angio Chest PE W and/or Wo Contrast  Result Date: 04/10/2022 CLINICAL DATA:  Pulmonary embolism (PE) suspected, low to intermediate prob, positive D-dimer; Abdominal pain, acute, nonlocalized. Mental status change. Dyspnea. History of lung cancer. * Tracking Code: BO * EXAM: CT ANGIOGRAPHY CHEST CT ABDOMEN AND PELVIS WITH CONTRAST TECHNIQUE: Multidetector CT imaging of the chest was performed using the standard protocol during bolus administration of intravenous contrast. Multiplanar CT image reconstructions and MIPs were obtained to evaluate the vascular anatomy. Multidetector CT imaging of the abdomen and pelvis was performed using the standard protocol during bolus administration of intravenous contrast. RADIATION DOSE REDUCTION: This exam was performed according to the departmental dose-optimization program which includes automated exposure control, adjustment of the mA and/or kV according to patient size and/or use of iterative reconstruction technique. CONTRAST:  161mL OMNIPAQUE IOHEXOL 350 MG/ML SOLN COMPARISON:  03/20/2022 chest CT. 02/12/2022 PET-CT. 10/04/2018 CT  abdomen/pelvis. FINDINGS: CTA CHEST FINDINGS Cardiovascular: The study is moderate quality for the evaluation of pulmonary embolism, limited by significant motion degradation. There are no filling defects in the central, lobar, segmental or subsegmental pulmonary artery branches to suggest acute pulmonary embolism. Great vessels are normal in course and caliber. Normal heart size. No significant pericardial fluid/thickening. Left  anterior descending and right coronary atherosclerosis/stents. Right internal jugular Port-A-Cath terminates at the cavoatrial junction. Mediastinum/Nodes: No significant thyroid nodules. Unremarkable esophagus. No pathologically enlarged axillary, mediastinal or hilar lymph nodes. Lungs/Pleura: No pneumothorax. No pleural effusion. Severe centrilobular emphysema. Sub solid 2.3 x 1.7 cm peripheral right upper lobe pulmonary nodule (series 11/image 54), stable from recent 03/20/2022 chest CT using similar measurement technique. Sub solid 1.4 x 1.1 cm superior segment left lower lobe nodule (series 11/image 44), stable. No acute consolidative airspace disease or new significant pulmonary nodules. Musculoskeletal: No aggressive appearing focal osseous lesions. Mild thoracic spondylosis. Chronic mild T5 vertebral compression fracture. Review of the MIP images confirms the above findings. CT ABDOMEN and PELVIS FINDINGS Hepatobiliary: Normal liver with no liver mass. Cholecystectomy. No biliary ductal dilatation. Pancreas: Normal, with no mass or duct dilation. Spleen: Normal size. No mass. Adrenals/Urinary Tract: Normal adrenals. Normal kidneys with no hydronephrosis and no renal mass. Normal bladder. Stomach/Bowel: Normal non-distended stomach. Normal caliber small bowel with no small bowel wall thickening. Normal appendix. Scattered mild colonic diverticulosis with no large bowel wall thickening or significant pericolonic fat stranding. Vascular/Lymphatic: Atherosclerotic nonaneurysmal  abdominal aorta. Patent portal, splenic, hepatic and renal veins. No pathologically enlarged lymph nodes in the abdomen or pelvis. Reproductive: Status post hysterectomy, with no abnormal findings at the vaginal cuff. Simple 3.0 cm left adnexal cyst (series 5/image 75), new from 02/12/2022 PET-CT. No right adnexal mass. Other: No pneumoperitoneum, ascites or focal fluid collection. Musculoskeletal: No aggressive appearing focal osseous lesions. Marked degenerative disc disease at L5-S1. Review of the MIP images confirms the above findings. IMPRESSION: 1. Limited motion degraded scan.  No evidence of pulmonary embolism. 2. Subsolid 2.3 cm peripheral right upper lobe and 1.4 cm superior segment left lower lobe pulmonary nodules, unchanged from recent 03/20/2022 chest CT. Multifocal pulmonary malignancy not excluded. No thoracic adenopathy. No evidence of metastatic disease in the abdomen or pelvis. 3. No acute pulmonary disease. No acute abnormality in the abdomen or pelvis. No evidence of bowel obstruction or acute bowel inflammation. Mild colonic diverticulosis. 4. Simple 3.0 cm left adnexal cyst, new from 02/12/2022 PET-CT. No follow-up imaging is recommended. Reference: JACR 2020 Feb;17(2):248-254 5. Aortic Atherosclerosis (ICD10-I70.0) and Emphysema (ICD10-J43.9). Electronically Signed   By: Ilona Sorrel M.D.   On: 04/10/2022 12:25   CT ABDOMEN PELVIS W CONTRAST  Result Date: 04/10/2022 CLINICAL DATA:  Pulmonary embolism (PE) suspected, low to intermediate prob, positive D-dimer; Abdominal pain, acute, nonlocalized. Mental status change. Dyspnea. History of lung cancer. * Tracking Code: BO * EXAM: CT ANGIOGRAPHY CHEST CT ABDOMEN AND PELVIS WITH CONTRAST TECHNIQUE: Multidetector CT imaging of the chest was performed using the standard protocol during bolus administration of intravenous contrast. Multiplanar CT image reconstructions and MIPs were obtained to evaluate the vascular anatomy. Multidetector CT  imaging of the abdomen and pelvis was performed using the standard protocol during bolus administration of intravenous contrast. RADIATION DOSE REDUCTION: This exam was performed according to the departmental dose-optimization program which includes automated exposure control, adjustment of the mA and/or kV according to patient size and/or use of iterative reconstruction technique. CONTRAST:  183mL OMNIPAQUE IOHEXOL 350 MG/ML SOLN COMPARISON:  03/20/2022 chest CT. 02/12/2022 PET-CT. 10/04/2018 CT abdomen/pelvis. FINDINGS: CTA CHEST FINDINGS Cardiovascular: The study is moderate quality for the evaluation of pulmonary embolism, limited by significant motion degradation. There are no filling defects in the central, lobar, segmental or subsegmental pulmonary artery branches to suggest acute pulmonary embolism. Great vessels are normal in course and caliber. Normal  heart size. No significant pericardial fluid/thickening. Left anterior descending and right coronary atherosclerosis/stents. Right internal jugular Port-A-Cath terminates at the cavoatrial junction. Mediastinum/Nodes: No significant thyroid nodules. Unremarkable esophagus. No pathologically enlarged axillary, mediastinal or hilar lymph nodes. Lungs/Pleura: No pneumothorax. No pleural effusion. Severe centrilobular emphysema. Sub solid 2.3 x 1.7 cm peripheral right upper lobe pulmonary nodule (series 11/image 54), stable from recent 03/20/2022 chest CT using similar measurement technique. Sub solid 1.4 x 1.1 cm superior segment left lower lobe nodule (series 11/image 44), stable. No acute consolidative airspace disease or new significant pulmonary nodules. Musculoskeletal: No aggressive appearing focal osseous lesions. Mild thoracic spondylosis. Chronic mild T5 vertebral compression fracture. Review of the MIP images confirms the above findings. CT ABDOMEN and PELVIS FINDINGS Hepatobiliary: Normal liver with no liver mass. Cholecystectomy. No biliary ductal  dilatation. Pancreas: Normal, with no mass or duct dilation. Spleen: Normal size. No mass. Adrenals/Urinary Tract: Normal adrenals. Normal kidneys with no hydronephrosis and no renal mass. Normal bladder. Stomach/Bowel: Normal non-distended stomach. Normal caliber small bowel with no small bowel wall thickening. Normal appendix. Scattered mild colonic diverticulosis with no large bowel wall thickening or significant pericolonic fat stranding. Vascular/Lymphatic: Atherosclerotic nonaneurysmal abdominal aorta. Patent portal, splenic, hepatic and renal veins. No pathologically enlarged lymph nodes in the abdomen or pelvis. Reproductive: Status post hysterectomy, with no abnormal findings at the vaginal cuff. Simple 3.0 cm left adnexal cyst (series 5/image 75), new from 02/12/2022 PET-CT. No right adnexal mass. Other: No pneumoperitoneum, ascites or focal fluid collection. Musculoskeletal: No aggressive appearing focal osseous lesions. Marked degenerative disc disease at L5-S1. Review of the MIP images confirms the above findings. IMPRESSION: 1. Limited motion degraded scan.  No evidence of pulmonary embolism. 2. Subsolid 2.3 cm peripheral right upper lobe and 1.4 cm superior segment left lower lobe pulmonary nodules, unchanged from recent 03/20/2022 chest CT. Multifocal pulmonary malignancy not excluded. No thoracic adenopathy. No evidence of metastatic disease in the abdomen or pelvis. 3. No acute pulmonary disease. No acute abnormality in the abdomen or pelvis. No evidence of bowel obstruction or acute bowel inflammation. Mild colonic diverticulosis. 4. Simple 3.0 cm left adnexal cyst, new from 02/12/2022 PET-CT. No follow-up imaging is recommended. Reference: JACR 2020 Feb;17(2):248-254 5. Aortic Atherosclerosis (ICD10-I70.0) and Emphysema (ICD10-J43.9). Electronically Signed   By: Ilona Sorrel M.D.   On: 04/10/2022 12:25   CT Head Wo Contrast  Result Date: 04/10/2022 CLINICAL DATA:  Mental status change, unknown  cause. EXAM: CT HEAD WITHOUT CONTRAST TECHNIQUE: Contiguous axial images were obtained from the base of the skull through the vertex without intravenous contrast. RADIATION DOSE REDUCTION: This exam was performed according to the departmental dose-optimization program which includes automated exposure control, adjustment of the mA and/or kV according to patient size and/or use of iterative reconstruction technique. COMPARISON:  Head CT 10/11/2019. FINDINGS: Brain: No acute hemorrhage, mass effect or midline shift. Gray-white differentiation is preserved. No hydrocephalus. No extra-axial collection. Basilar cisterns are patent. Vascular: No hyperdense vessel or unexpected calcification. Skull: No calvarial fracture or suspicious bone lesion. Skull base is unremarkable. Sinuses/Orbits: Unremarkable. Other: None. IMPRESSION: No acute intracranial abnormality. Electronically Signed   By: Emmit Alexanders M.D.   On: 04/10/2022 12:07   DG Chest Portable 1 View  Result Date: 04/10/2022 CLINICAL DATA:  Shortness of breath. EXAM: PORTABLE CHEST 1 VIEW COMPARISON:  03/15/2022 FINDINGS: Right jugular Port-A-Cath is stable with the tip near the superior cavoatrial junction. Heart size is within normal limits and stable. Densities near the left costophrenic angle  appear to be chronic. No new airspace disease or pulmonary edema. Negative for a pneumothorax. IMPRESSION: No active disease. Electronically Signed   By: Markus Daft M.D.   On: 04/10/2022 10:05    Procedures Procedures    Medications Ordered in ED Medications - No data to display  ED Course/ Medical Decision Making/ A&P                             Medical Decision Making Amount and/or Complexity of Data Reviewed Labs: ordered. Decision-making details documented in ED Course. Radiology: ordered and independent interpretation performed. Decision-making details documented in ED Course. ECG/medicine tests: ordered and independent interpretation performed.  Decision-making details documented in ED Course.  Risk Prescription drug management. Decision regarding hospitalization.   Altered mental status with upper abdominal pain for the past 2 days.  Vitals are stable, no distress, no fever.  ABG without significant CO2 retention.  No hypoxia or home oxygen.  Chest x-ray negative for infiltrate or pneumothorax.  Results reviewed interpreted by me. EKG without acute ischemia.  Labs show stable anemia.  No significant CO2 retention.  Creatinine is at baseline.  Urinalysis negative for infection.  CT head shows no acute findings.  Results reviewed interpreted by me. No explanation for abdominal pain seen on CT imaging.  Does show stable lung nodules.  No evidence of pneumonia or pulmonary embolism.  Patient with known lung cancer currently on oral chemotherapy.  Son at bedside feels patient is not at her baseline and does agree she is somewhat confused.  Uncertain etiology of this currently  May be overmedication.  Patient does take Ativan at home as well as getting cough syrup.  MRI will be obtained to assess for drug or other CNS pathology.  Low suspicion for meningitis.  No evidence of infection on workup so far.  Urinalysis negative, chest x-ray negative.  Workup as above shows stable lung nodules without explanation for abdominal pain.  Given her ongoing altered mental status will plan admission for further evaluation.  Discussed with Dr. Feliberto Gottron.        Final Clinical Impression(s) / ED Diagnoses Final diagnoses:  Altered mental status, unspecified altered mental status type  Left upper quadrant abdominal pain    Rx / DC Orders ED Discharge Orders     None         Cher Egnor, Annie Main, MD 04/10/22 1356

## 2022-04-10 NOTE — H&P (Signed)
History and Physical    Patient: [name] UXL:[id number] DOB: 1955-12-17 DOA: 04/10/2022 DOS: the patient was seen and examined on 04/10/2022 PCP: [name], NP  Patient coming from: Home  Chief Complaint:  Chief Complaint  Patient presents with   Shortness of Breath   Abdominal Pain   HPI: [name] is a 67 y.o. female who presents to the hospital with abdominal pain. Pt's husband at the bedside reports that last night she complained of stomach soreness.  This morning when she woke up she "did not know what was going on".  Pt reports subjective chills at home. Pt had an extensive workup in the ER which is largely negative except for some rare bacteria on her UA. CT chest/abd/pelvis did not show any acute pathology that would explain her abdominal pain.   Pt's husband advised that he had tried to get the pt to drink "Liquid IV, hydration multiplier" but that it bothered the pt's stomach and she was not able to take all of it. She is also scheduled for a brain MRI (which was ordered by the ER provider, but this is pending at this time). As the pt has some bacteria on her UA she will be placed on observation for IV fluids and IV ceftriaxone.   UPDATE: Brain MRI did not show any acute CVA or mass  Review of Systems: As mentioned in the history of present illness. All other systems reviewed and are negative. Past Medical History:  Diagnosis Date   Anemia    "long time ago" (12/06/2012)   Anxiety    Arthritis    "right knee real bad; in my hands bad" (12/06/2012)   Asthma    Celiac disease    Colon polyps    COPD (chronic obstructive pulmonary disease) (HCC)    Coronary artery disease    a. s/p Xience DES to [facility] 04/2008;  b. LHC 05/2008: Proximal RCA 25%, mid RCA stent patent.  c. Anormal nuc 2014 -> s/p LHC with severe mRCA stenosis s/p DES. d. Cath 04/2013 s/p DES to LAD.  e. LHC 08/2015 was stable.   Depression    "years ago" (12/06/2012)   Fibromyalgia    Gallstones     GERD (gastroesophageal reflux disease)    H/O hiatal hernia    Hepatitis    "not A, B, or C" (12/06/2012)   Hyperlipidemia    intol to statins and other chol agents due to elevated LFTs   Hypertension    Hypothyroidism    IBS (irritable bowel syndrome)    Interstitial cystitis    Lung cancer ([place]) dx'd 01/2018   Lung nodules    Bilateral   Migraines    "when I was younger" (12/06/2012)   Pneumonia    PONV (postoperative nausea and vomiting)    "Very bad"   Premature atrial contractions    PVC's (premature ventricular contractions)    Sinus headache    SVT (supraventricular tachycardia)    Past Surgical History:  Procedure Laterality Date   CARDIAC CATHETERIZATION  04/2008; 05/2008   CARDIAC CATHETERIZATION N/A 08/07/2015   Procedure: Left Heart Cath and Coronary Angiography;  Surgeon: Sherren Mocha, MD;  Location: [place] [facility];  Service: Cardiovascular;  Laterality: N/A;   CHOLECYSTECTOMY     CORONARY ANGIOPLASTY WITH STENT PLACEMENT  04/2008 12/06/2012   "1 + 1" (12/06/2012)   CORONARY STENT PLACEMENT  05/08/2013   DES TO LAD      DR Angelena Form   IR  IMAGING GUIDED PORT INSERTION  04/29/2018   LEFT HEART CATHETERIZATION WITH CORONARY ANGIOGRAM N/A 12/06/2012   Procedure: LEFT HEART CATHETERIZATION WITH CORONARY ANGIOGRAM;  Surgeon: [name], MD;  Location: [facility] CATH LAB;  Service: Cardiovascular;  Laterality: N/A;   LEFT HEART CATHETERIZATION WITH CORONARY ANGIOGRAM N/A 05/08/2013   Procedure: LEFT HEART CATHETERIZATION WITH CORONARY ANGIOGRAM;  Surgeon: [name], MD;  Location: [facility] CATH LAB;  Service: Cardiovascular;  Laterality: N/A;   LEFT HEART CATHETERIZATION WITH CORONARY ANGIOGRAM N/A 01/04/2014   Procedure: LEFT HEART CATHETERIZATION WITH CORONARY ANGIOGRAM;  Surgeon: [name], MD;  Location: [facility] CATH LAB;  Service: Cardiovascular;  Laterality: N/A;   PERCUTANEOUS CORONARY STENT INTERVENTION (PCI-S)  12/06/2012   Procedure: PERCUTANEOUS  CORONARY STENT INTERVENTION (PCI-S);  Surgeon: [name], MD;  Location: [facility] CATH LAB;  Service: Cardiovascular;;   PERCUTANEOUS CORONARY STENT INTERVENTION (PCI-S)  05/08/2013   Procedure: PERCUTANEOUS CORONARY STENT INTERVENTION (PCI-S);  Surgeon: [name], MD;  Location: [facility] CATH LAB;  Service: Cardiovascular;;  mid LAD    TUBAL LIGATION     VAGINAL HYSTERECTOMY     VIDEO BRONCHOSCOPY WITH ENDOBRONCHIAL NAVIGATION N/A 02/17/2018   Procedure: VIDEO BRONCHOSCOPY WITH ENDOBRONCHIAL NAVIGATION;  Surgeon: Melrose Nakayama, MD;  Location: [place];  Service: Thoracic;  Laterality: N/A;   Social History:  reports that she quit smoking about 15 years ago. Her smoking use included cigarettes. She has a 15.00 pack-year smoking history. She has never used smokeless tobacco. She reports that she does not drink alcohol and does not use drugs.  Allergies  Allergen Reactions   Ciprofloxacin Hives   Levofloxacin     UNSPECIFIED REACTION, BUT LIKELY HIVES > HIVES WITH CIPRO   Statins Other (See Comments)    Liver enzymes Liver enzymes   Tricor [Fenofibrate] Hives and Itching   Compazine [Prochlorperazine]     Hx of tardive dyskinesia with compazine   Latex Other (See Comments)    Patient states "Messes with my skin."   Codeine Nausea And Vomiting   Metoprolol Other (See Comments)    Does not tolerate beta blockers 10/02/2013-pt states she can tolerate 25 mg and lower     Family History  Problem Relation Age of Onset   Colon cancer Father 4   Lung disease Father    Emphysema Mother    Drug abuse Brother    Hepatitis C Brother    Drug abuse Brother    Celiac disease Son    Celiac disease Grandson    Down syndrome Grandson    Diabetes Granddaughter    CAD Neg Hx     Prior to Admission medications   Medication Sig Start Date End Date Taking? Authorizing Provider  adagrasib (KRAZATI) 200 MG tablet Take 2 tablets (400 mg total) by mouth 2 (two) times daily. 03/13/22    Heilingoetter, Cassandra L, PA-C  albuterol (PROVENTIL) (5 MG/ML) 0.5% nebulizer solution Take 2.5 mg by nebulization every 6 (six) hours as needed for wheezing.    [provider]  albuterol (VENTOLIN HFA) 108 (90 Base) MCG/ACT inhaler Inhale 1 puff into the lungs every 6 (six) hours as needed (wheezing/shortness of breath.).     [provider]  aspirin 81 MG chewable tablet Chew 1 tablet (81 mg total) by mouth daily. 12/07/12   Barrett, Evelene Croon, PA-C  benzonatate (TESSALON) 200 MG capsule Take 1 capsule (200 mg total) by mouth 3 (three) times daily as needed for cough. 03/31/22   Pokhrel,  Laxman, MD  Budeson-Glycopyrrol-Formoterol (BREZTRI AEROSPHERE) 160-9-4.8 MCG/ACT AERO Inhale 2 puffs into the lungs in the morning and at bedtime. 10/14/21   Icard, Octavio Graves, DO  buPROPion (WELLBUTRIN XL) 150 MG 24 hr tablet Take 150 mg by mouth daily. 03/05/22   [provider]  cyanocobalamin 1000 MCG tablet Take 1 tablet (1,000 mcg total) by mouth daily. 03/31/22 07/09/22  Pokhrel, Corrie Mckusick, MD  famotidine (PEPCID) 10 MG tablet Take 1 tablet (10 mg total) by mouth at bedtime. 03/31/22 04/30/22  Pokhrel, Corrie Mckusick, MD  gabapentin (NEURONTIN) 100 MG capsule Take 1 capsule (100 mg total) by mouth 3 (three) times daily for 15 days. 03/31/22 04/15/22  Pokhrel, Corrie Mckusick, MD  HYDROcodone bit-homatropine (HYCODAN) 5-1.5 MG/5ML syrup Take 5 mLs by mouth every 6 (six) hours as needed for cough. 03/11/22   Heilingoetter, Cassandra L, PA-C  ipratropium-albuterol (DUONEB) 0.5-2.5 (3) MG/3ML SOLN Inhale 3 mLs into the lungs every 6 (six) hours as needed (Asthma). 01/27/21   Parrett, Fonnie Mu, NP  LORazepam (ATIVAN) 2 MG tablet Take 2 mg by mouth every evening.    [provider]  metFORMIN (GLUCOPHAGE-XR) 500 MG 24 hr tablet Take 1 tablet (500 mg total) by mouth daily with breakfast. 03/31/22   Pokhrel, Corrie Mckusick, MD  metoprolol tartrate (LOPRESSOR) 25 MG tablet TAKE 1/2 TABLET(12.5 MG) BY MOUTH TWICE  DAILY Patient taking differently: Take 12.5 mg by mouth 2 (two) times daily. 06/25/20   [name], MD  nitroGLYCERIN (NITROSTAT) 0.4 MG SL tablet Place 1 tablet (0.4 mg total) under the tongue every 5 (five) minutes as needed for chest pain. 12/07/12   Barrett, Evelene Croon, PA-C  ondansetron (ZOFRAN-ODT) 4 MG disintegrating tablet Take 1 tablet (4 mg total) by mouth every 8 (eight) hours as needed for nausea or vomiting. 02/16/22   Heilingoetter, Cassandra L, PA-C  senna-docusate (SENOKOT-S) 8.6-50 MG tablet Take 1 tablet by mouth at bedtime as needed for mild constipation. 03/31/22 04/30/22  Pokhrel, Corrie Mckusick, MD  Vitamin D, Ergocalciferol, (DRISDOL) 1.25 MG (50000 UT) CAPS capsule Take 50,000 Units by mouth every 7 (seven) days. Wednesdays 02/12/19   [provider]    Physical Exam: Vitals:   04/10/22 0945 04/10/22 0950 04/10/22 1415  BP:   129/68  Pulse: 95  87  Resp:   (!) 24  Temp:  99 F (37.2 C) 98.2 F (36.8 C)  TempSrc:  Oral Oral  SpO2: 98%  98%   Physical Exam Constitutional:      Appearance: She is well-developed.  HENT:     Head: Normocephalic and atraumatic.  Cardiovascular:     Rate and Rhythm: Normal rate and regular rhythm.  Pulmonary:     Effort: Pulmonary effort is normal.  Abdominal:     Palpations: Abdomen is soft.     Comments: Minimally tender   Skin:    General: Skin is warm.  Neurological:     Mental Status: She is alert and oriented to person, place, and time.  Psychiatric:        Mood and Affect: Mood normal.    Data Reviewed:  There are no new results to review at this time.  Assessment and Plan: Abdominal pain  - IV NS 75 cc/hr - IV ceftriaxone 1 g daily  - IV reglan 10 mg q6 hr x 5 doses   UTI - IV ceftriaxone as above    Elevated TSH  - Synthroid 50 mcg PO daily   CKD3a - IV fluids as above  Lung CA - Outpt follow up   DVT prophylaxis: SCDs GI prophylaxis: Pepcid 20 mg PO bedtime    Advance Care Planning:    Code Status: Prior FULL  Severity of Illness: The appropriate patient status for this patient is OBSERVATION. Observation status is judged to be reasonable and necessary in order to provide the required intensity of service to ensure the patient's safety. The patient's presenting symptoms, physical exam findings, and initial radiographic and laboratory data in the context of their medical condition is felt to place them at decreased risk for further clinical deterioration. Furthermore, it is anticipated that the patient will be medically stable for discharge from the hospital within 2 midnights of admission.   Author: Lucienne Minks , MD 04/10/2022 2:36 PM  For on call review [email or web address].

## 2022-04-10 NOTE — ED Triage Notes (Signed)
Pt arrived via EMS from home complaining of SOB, and some abdominal pain which started 2 days ago. She is a lung cancer pt. Marland Kitchen

## 2022-04-11 DIAGNOSIS — R1084 Generalized abdominal pain: Secondary | ICD-10-CM

## 2022-04-11 LAB — COMPREHENSIVE METABOLIC PANEL
ALT: 19 U/L (ref 0–44)
AST: 18 U/L (ref 15–41)
Albumin: 3 g/dL — ABNORMAL LOW (ref 3.5–5.0)
Alkaline Phosphatase: 87 U/L (ref 38–126)
Anion gap: 7 (ref 5–15)
BUN: 17 mg/dL (ref 8–23)
CO2: 25 mmol/L (ref 22–32)
Calcium: 8.2 mg/dL — ABNORMAL LOW (ref 8.9–10.3)
Chloride: 110 mmol/L (ref 98–111)
Creatinine, Ser: 1.11 mg/dL — ABNORMAL HIGH (ref 0.44–1.00)
GFR, Estimated: 54 mL/min — ABNORMAL LOW (ref >60)
Glucose, Bld: 124 mg/dL — ABNORMAL HIGH (ref 70–99)
Potassium: 3.4 mmol/L — ABNORMAL LOW (ref 3.5–5.1)
Sodium: 142 mmol/L (ref 135–145)
Total Bilirubin: 0.7 mg/dL (ref 0.3–1.2)
Total Protein: 5.8 g/dL — ABNORMAL LOW (ref 6.5–8.1)

## 2022-04-11 LAB — CBC
HCT: 26.2 % — ABNORMAL LOW (ref 36.0–46.0)
Hemoglobin: 8.1 g/dL — ABNORMAL LOW (ref 12.0–15.0)
MCH: 30.3 pg (ref 26.0–34.0)
MCHC: 30.9 g/dL (ref 30.0–36.0)
MCV: 98.1 fL (ref 80.0–100.0)
Platelets: 251 10*3/uL (ref 150–400)
RBC: 2.67 MIL/uL — ABNORMAL LOW (ref 3.87–5.11)
RDW: 15.9 % — ABNORMAL HIGH (ref 11.5–15.5)
WBC: 4.1 10*3/uL (ref 4.0–10.5)
nRBC: 0 % (ref 0.0–0.2)

## 2022-04-11 LAB — PHOSPHORUS: Phosphorus: 3.2 mg/dL (ref 2.5–4.6)

## 2022-04-11 LAB — HEMOGLOBIN A1C
Hgb A1c MFr Bld: 6.3 % — ABNORMAL HIGH (ref 4.8–5.6)
Mean Plasma Glucose: 134.11 mg/dL

## 2022-04-11 LAB — MAGNESIUM: Magnesium: 1.9 mg/dL (ref 1.7–2.4)

## 2022-04-11 LAB — C-REACTIVE PROTEIN: CRP: 2.5 mg/dL — ABNORMAL HIGH (ref <1.0)

## 2022-04-11 MED ORDER — HYDROCODONE-ACETAMINOPHEN 5-325 MG PO TABS
1.0000 | ORAL_TABLET | Freq: Four times a day (QID) | ORAL | Status: DC | PRN
  Administered 2022-04-11 (×2): 1 via ORAL
  Filled 2022-04-11 (×2): qty 1

## 2022-04-11 MED ORDER — SODIUM CHLORIDE 0.9% FLUSH
10.0000 mL | Freq: Two times a day (BID) | INTRAVENOUS | Status: DC
  Administered 2022-04-11 – 2022-04-13 (×3): 10 mL

## 2022-04-11 MED ORDER — SODIUM CHLORIDE 0.9% FLUSH: 10.0000 mL | INTRAVENOUS | Status: DC | PRN

## 2022-04-11 MED ORDER — ONDANSETRON HCL 4 MG/2ML IJ SOLN
4.0000 mg | Freq: Four times a day (QID) | INTRAMUSCULAR | Status: DC | PRN
  Administered 2022-04-11: 4 mg via INTRAVENOUS
  Filled 2022-04-11: qty 2

## 2022-04-11 MED ORDER — ZINC OXIDE 12.8 % EX OINT
TOPICAL_OINTMENT | CUTANEOUS | Status: DC | PRN
  Filled 2022-04-11 (×2): qty 56.7

## 2022-04-11 MED ORDER — POTASSIUM CHLORIDE CRYS ER 20 MEQ PO TBCR
40.0000 meq | EXTENDED_RELEASE_TABLET | Freq: Once | ORAL | Status: AC
  Administered 2022-04-11: 40 meq via ORAL
  Filled 2022-04-11: qty 2

## 2022-04-11 NOTE — Progress Notes (Signed)
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  Also reported subjective chills at home.  UA done on presentation was noticed only suggestive of UTI.  CT chest/abdomen/pelvis did not show any acute pathology that could explain her abdominal pain.  Patient admitted for further management.  Assessment & Plan:  Principal Problem:   Abdominal pain   Abdominal pain: Unclear etiology.  CT abdomen/pelvis did not show any acute findings.  On examination this morning her abdomen is benign, soft, nontender, bowel sounds are present. She says her abdomen pain is better but not completely gone. Will try Bentyl  Suspected UTI: UA done on admission was no source of UTI.  Empirically started on ceftriaxone.  Urine culture never sent.  Patient denies any dysuria.  Will discontinue antibiotic.  No fever or leukocytosis.  History is for adenocarcinoma of left lung: Follows with Dr. Julien Nordmann.  CT chest showed lung nodules.  Will recommend follow-up with oncology as an outpatient  Hypothyroidism: Continue Synthyroid  Hypertension: On Lopressor.  Blood pressure stable so on hold  Chronic hypoxic respiratory failure: On 2  to 4 L of oxygen at home chronically.  History of CKD stage IIIb: Currently kidney function  at baseline.  Baseline creatinine ranges from 1.4-2.  Chronic normocytic anemia: Currently hemoglobin in the range of 8.  Her baseline hemoglobin is around 9-10  Mood disorder: On Wellbutrin  Hypokalemia: Supplemented with potassium        DVT prophylaxis:Place and maintain sequential compression device Start: 04/10/22 1449     Code Status:  Prior  Family Communication: None at bedside  Patient status: OBSERVATION  Patient is from : Home  Anticipated discharge to: Home  Estimated DC date: Tomorrow   Consultants: None  Procedures: None  Antimicrobials:  Anti-infectives (From admission, onward)    Start     Dose/Rate Route Frequency Ordered Stop   04/10/22 1500  cefTRIAXone (ROCEPHIN) 1 g in sodium chloride 0.9 % 100 mL IVPB        1 g 200 mL/hr over 30 Minutes Intravenous Every 24 hours 04/10/22 1435         Subjective: Patient seen and examined at bedside today.  Hemodynamically stable her abdomen pain is getting better.  Denies nausea, vomiting.  Wants to try solid diet.  Objective: Vitals:   04/10/22 1706 04/10/22 2130 04/11/22 0110 04/11/22 0517  BP: (!) 153/74 130/65 126/67 132/60  Pulse: 98 92 92 92  Resp: 18 16 16 16   Temp: (!) 97.4 F (36.3 C) 98.1 F (36.7 C) 98 F (36.7 C) 97.8 F (36.6 C)  TempSrc: Oral Oral Oral Oral  SpO2: 99% 100% 100% 98%    Intake/Output Summary (Last 24 hours) at 04/11/2022 1136 Last data filed at 04/11/2022 1125 Gross per 24 hour  Intake --  Output 1 ml  Net -1 ml   There were no vitals filed for this visit.  Examination:  General exam: Overall comfortable, not in distress, appears chronically deconditioned, weak HEENT: PERRL Respiratory system:  no wheezes or crackles  Cardiovascular system: S1 & S2 heard, RRR.  Gastrointestinal system: Abdomen is nondistended, soft and nontender. Central nervous system: Alert and oriented Extremities: No edema,  no clubbing ,no cyanosis Skin: No rashes, no ulcers,no icterus     Data Reviewed: I have personally reviewed following labs and imaging studies  CBC: Recent Labs  Lab 04/10/22 1012 04/11/22 0526  WBC 5.0 4.1  NEUTROABS 2.8  --   HGB 9.0* 8.1*  HCT 28.9* 26.2*  MCV 96.3 98.1  PLT 296 856   Basic Metabolic Panel: Recent Labs  Lab 04/10/22 1012 04/11/22 0526  NA 138 142  K 3.5 3.4*  CL 103 110   CO2 25 25  GLUCOSE 153* 124*  BUN 18 17  CREATININE 1.19* 1.11*  CALCIUM 8.6* 8.2*  MG  --  1.9  PHOS  --  3.2     Recent Results (from the past 240 hour(s))  Resp panel by RT-PCR (RSV, Flu A&B, Covid) Anterior Nasal Swab     Status: None   Collection Time: 04/10/22  1:36 PM   Specimen: Anterior Nasal Swab  Result Value Ref Range Status   SARS Coronavirus 2 by RT PCR NEGATIVE NEGATIVE Final    Comment: (NOTE) SARS-CoV-2 target nucleic acids are NOT DETECTED.  The SARS-CoV-2 RNA is generally detectable in upper respiratory specimens during the acute phase of infection. The lowest concentration of SARS-CoV-2 viral copies this assay can detect is 138 copies/mL. A negative result does not preclude SARS-Cov-2 infection and should not be used as the sole basis for treatment or other patient management decisions. A negative result may occur with  improper specimen collection/handling, submission of specimen other than nasopharyngeal swab, presence of viral mutation(s) within the areas targeted by this assay, and inadequate number of viral copies(<138 copies/mL). A negative result must be combined with clinical observations, patient history, and epidemiological information. The expected result is Negative.  Fact Sheet for Patients:  EntrepreneurPulse.com.au  Fact Sheet for Healthcare Providers:  IncredibleEmployment.be  This test is no t yet approved or cleared by the [place] FDA and  has been authorized for detection and/or diagnosis of SARS-CoV-2 by FDA under an Emergency Use Authorization (EUA). This EUA will remain  in effect (meaning this test can be used) for the duration of the COVID-19 declaration under Section 564(b)(1) of the Act, 21 U.S.C.section 360bbb-3(b)(1), unless the authorization is terminated  or revoked sooner.       Influenza A by PCR NEGATIVE NEGATIVE Final   Influenza B by PCR NEGATIVE NEGATIVE Final     Comment: (NOTE) The Xpert Xpress SARS-CoV-2/FLU/RSV plus assay is intended as an aid in the diagnosis of influenza from Nasopharyngeal swab specimens and should not be used as a sole basis for treatment. Nasal washings and aspirates are unacceptable for Xpert Xpress SARS-CoV-2/FLU/RSV testing.  Fact Sheet for Patients: EntrepreneurPulse.com.au  Fact Sheet for Healthcare Providers: IncredibleEmployment.be  This test is not yet approved or cleared by the [place] FDA and has been authorized for detection and/or diagnosis of SARS-CoV-2 by FDA under an Emergency Use Authorization (EUA). This EUA will remain in effect (meaning this test can be used) for the duration of the COVID-19 declaration under Section 564(b)(1) of the Act, 21 U.S.C. section 360bbb-3(b)(1), unless the authorization is terminated or revoked.     Resp Syncytial Virus by PCR NEGATIVE NEGATIVE Final    Comment: (NOTE) Fact Sheet for Patients: EntrepreneurPulse.com.au  Fact Sheet for Healthcare Providers: IncredibleEmployment.be  This test is not yet approved or cleared by the [place] FDA and has been authorized for detection and/or diagnosis of SARS-CoV-2 by FDA under an Emergency Use Authorization (EUA). This  EUA will remain in effect (meaning this test can be used) for the duration of the COVID-19 declaration under Section 564(b)(1) of the Act, 21 U.S.C. section 360bbb-3(b)(1), unless the authorization is terminated or revoked.  Performed at [facility], [place] [address], [place], [place] [address]      Radiology Studies: MR BRAIN WO CONTRAST  Result Date: 04/10/2022 CLINICAL DATA:  Mental status change, unknown cause. EXAM: MRI HEAD WITHOUT CONTRAST TECHNIQUE: Multiplanar, multiecho pulse sequences of the brain and surrounding structures were obtained without intravenous contrast. COMPARISON:  MRI brain  03/06/2022. FINDINGS: Brain: Motion degraded study. No acute infarct or hemorrhage. No mass or midline shift. Few areas of T2 hyperintensity in the cerebral white matter unchanged and likely due to chronic small-vessel disease. No hydrocephalus or extra-axial collection. Vascular: Normal flow voids. Skull and upper cervical spine: Normal marrow signal. Sinuses/Orbits: Mucous retention cyst in the left maxillary sinus. Other: None. IMPRESSION: No acute intracranial abnormality or mass. Electronically Signed   By: Emmit Alexanders M.D.   On: 04/10/2022 15:28   CT Angio Chest PE W and/or Wo Contrast  Result Date: 04/10/2022 CLINICAL DATA:  Pulmonary embolism (PE) suspected, low to intermediate prob, positive D-dimer; Abdominal pain, acute, nonlocalized. Mental status change. Dyspnea. History of lung cancer. * Tracking Code: BO * EXAM: CT ANGIOGRAPHY CHEST CT ABDOMEN AND PELVIS WITH CONTRAST TECHNIQUE: Multidetector CT imaging of the chest was performed using the standard protocol during bolus administration of intravenous contrast. Multiplanar CT image reconstructions and MIPs were obtained to evaluate the vascular anatomy. Multidetector CT imaging of the abdomen and pelvis was performed using the standard protocol during bolus administration of intravenous contrast. RADIATION DOSE REDUCTION: This exam was performed according to the departmental dose-optimization program which includes automated exposure control, adjustment of the mA and/or kV according to patient size and/or use of iterative reconstruction technique. CONTRAST:  171mL OMNIPAQUE IOHEXOL 350 MG/ML SOLN COMPARISON:  03/20/2022 chest CT. 02/12/2022 PET-CT. 10/04/2018 CT abdomen/pelvis. FINDINGS: CTA CHEST FINDINGS Cardiovascular: The study is moderate quality for the evaluation of pulmonary embolism, limited by significant motion degradation. There are no filling defects in the central, lobar, segmental or subsegmental pulmonary artery branches to  suggest acute pulmonary embolism. Great vessels are normal in course and caliber. Normal heart size. No significant pericardial fluid/thickening. Left anterior descending and right coronary atherosclerosis/stents. Right internal jugular Port-A-Cath terminates at the cavoatrial junction. Mediastinum/Nodes: No significant thyroid nodules. Unremarkable esophagus. No pathologically enlarged axillary, mediastinal or hilar lymph nodes. Lungs/Pleura: No pneumothorax. No pleural effusion. Severe centrilobular emphysema. Sub solid 2.3 x 1.7 cm peripheral right upper lobe pulmonary nodule (series 11/image 54), stable from recent 03/20/2022 chest CT using similar measurement technique. Sub solid 1.4 x 1.1 cm superior segment left lower lobe nodule (series 11/image 44), stable. No acute consolidative airspace disease or new significant pulmonary nodules. Musculoskeletal: No aggressive appearing focal osseous lesions. Mild thoracic spondylosis. Chronic mild T5 vertebral compression fracture. Review of the MIP images confirms the above findings. CT ABDOMEN and PELVIS FINDINGS Hepatobiliary: Normal liver with no liver mass. Cholecystectomy. No biliary ductal dilatation. Pancreas: Normal, with no mass or duct dilation. Spleen: Normal size. No mass. Adrenals/Urinary Tract: Normal adrenals. Normal kidneys with no hydronephrosis and no renal mass. Normal bladder. Stomach/Bowel: Normal non-distended stomach. Normal caliber small bowel with no small bowel wall thickening. Normal appendix. Scattered mild colonic diverticulosis with no large bowel wall thickening or significant pericolonic fat stranding. Vascular/Lymphatic: Atherosclerotic nonaneurysmal abdominal aorta. Patent portal, splenic, hepatic and renal  veins. No pathologically enlarged lymph nodes in the abdomen or pelvis. Reproductive: Status post hysterectomy, with no abnormal findings at the vaginal cuff. Simple 3.0 cm left adnexal cyst (series 5/image 75), new from 02/12/2022  PET-CT. No right adnexal mass. Other: No pneumoperitoneum, ascites or focal fluid collection. Musculoskeletal: No aggressive appearing focal osseous lesions. Marked degenerative disc disease at L5-S1. Review of the MIP images confirms the above findings. IMPRESSION: 1. Limited motion degraded scan.  No evidence of pulmonary embolism. 2. Subsolid 2.3 cm peripheral right upper lobe and 1.4 cm superior segment left lower lobe pulmonary nodules, unchanged from recent 03/20/2022 chest CT. Multifocal pulmonary malignancy not excluded. No thoracic adenopathy. No evidence of metastatic disease in the abdomen or pelvis. 3. No acute pulmonary disease. No acute abnormality in the abdomen or pelvis. No evidence of bowel obstruction or acute bowel inflammation. Mild colonic diverticulosis. 4. Simple 3.0 cm left adnexal cyst, new from 02/12/2022 PET-CT. No follow-up imaging is recommended. Reference: JACR 2020 Feb;17(2):248-254 5. Aortic Atherosclerosis (ICD10-I70.0) and Emphysema (ICD10-J43.9). Electronically Signed   By: Ilona Sorrel M.D.   On: 04/10/2022 12:25   CT ABDOMEN PELVIS W CONTRAST  Result Date: 04/10/2022 CLINICAL DATA:  Pulmonary embolism (PE) suspected, low to intermediate prob, positive D-dimer; Abdominal pain, acute, nonlocalized. Mental status change. Dyspnea. History of lung cancer. * Tracking Code: BO * EXAM: CT ANGIOGRAPHY CHEST CT ABDOMEN AND PELVIS WITH CONTRAST TECHNIQUE: Multidetector CT imaging of the chest was performed using the standard protocol during bolus administration of intravenous contrast. Multiplanar CT image reconstructions and MIPs were obtained to evaluate the vascular anatomy. Multidetector CT imaging of the abdomen and pelvis was performed using the standard protocol during bolus administration of intravenous contrast. RADIATION DOSE REDUCTION: This exam was performed according to the departmental dose-optimization program which includes automated exposure control, adjustment of the  mA and/or kV according to patient size and/or use of iterative reconstruction technique. CONTRAST:  153mL OMNIPAQUE IOHEXOL 350 MG/ML SOLN COMPARISON:  03/20/2022 chest CT. 02/12/2022 PET-CT. 10/04/2018 CT abdomen/pelvis. FINDINGS: CTA CHEST FINDINGS Cardiovascular: The study is moderate quality for the evaluation of pulmonary embolism, limited by significant motion degradation. There are no filling defects in the central, lobar, segmental or subsegmental pulmonary artery branches to suggest acute pulmonary embolism. Great vessels are normal in course and caliber. Normal heart size. No significant pericardial fluid/thickening. Left anterior descending and right coronary atherosclerosis/stents. Right internal jugular Port-A-Cath terminates at the cavoatrial junction. Mediastinum/Nodes: No significant thyroid nodules. Unremarkable esophagus. No pathologically enlarged axillary, mediastinal or hilar lymph nodes. Lungs/Pleura: No pneumothorax. No pleural effusion. Severe centrilobular emphysema. Sub solid 2.3 x 1.7 cm peripheral right upper lobe pulmonary nodule (series 11/image 54), stable from recent 03/20/2022 chest CT using similar measurement technique. Sub solid 1.4 x 1.1 cm superior segment left lower lobe nodule (series 11/image 44), stable. No acute consolidative airspace disease or new significant pulmonary nodules. Musculoskeletal: No aggressive appearing focal osseous lesions. Mild thoracic spondylosis. Chronic mild T5 vertebral compression fracture. Review of the MIP images confirms the above findings. CT ABDOMEN and PELVIS FINDINGS Hepatobiliary: Normal liver with no liver mass. Cholecystectomy. No biliary ductal dilatation. Pancreas: Normal, with no mass or duct dilation. Spleen: Normal size. No mass. Adrenals/Urinary Tract: Normal adrenals. Normal kidneys with no hydronephrosis and no renal mass. Normal bladder. Stomach/Bowel: Normal non-distended stomach. Normal caliber small bowel with no small bowel  wall thickening. Normal appendix. Scattered mild colonic diverticulosis with no large bowel wall thickening or significant pericolonic fat stranding. Vascular/Lymphatic: Atherosclerotic nonaneurysmal  abdominal aorta. Patent portal, splenic, hepatic and renal veins. No pathologically enlarged lymph nodes in the abdomen or pelvis. Reproductive: Status post hysterectomy, with no abnormal findings at the vaginal cuff. Simple 3.0 cm left adnexal cyst (series 5/image 75), new from 02/12/2022 PET-CT. No right adnexal mass. Other: No pneumoperitoneum, ascites or focal fluid collection. Musculoskeletal: No aggressive appearing focal osseous lesions. Marked degenerative disc disease at L5-S1. Review of the MIP images confirms the above findings. IMPRESSION: 1. Limited motion degraded scan.  No evidence of pulmonary embolism. 2. Subsolid 2.3 cm peripheral right upper lobe and 1.4 cm superior segment left lower lobe pulmonary nodules, unchanged from recent 03/20/2022 chest CT. Multifocal pulmonary malignancy not excluded. No thoracic adenopathy. No evidence of metastatic disease in the abdomen or pelvis. 3. No acute pulmonary disease. No acute abnormality in the abdomen or pelvis. No evidence of bowel obstruction or acute bowel inflammation. Mild colonic diverticulosis. 4. Simple 3.0 cm left adnexal cyst, new from 02/12/2022 PET-CT. No follow-up imaging is recommended. Reference: JACR 2020 Feb;17(2):248-254 5. Aortic Atherosclerosis (ICD10-I70.0) and Emphysema (ICD10-J43.9). Electronically Signed   By: Ilona Sorrel M.D.   On: 04/10/2022 12:25   CT Head Wo Contrast  Result Date: 04/10/2022 CLINICAL DATA:  Mental status change, unknown cause. EXAM: CT HEAD WITHOUT CONTRAST TECHNIQUE: Contiguous axial images were obtained from the base of the skull through the vertex without intravenous contrast. RADIATION DOSE REDUCTION: This exam was performed according to the departmental dose-optimization program which includes automated  exposure control, adjustment of the mA and/or kV according to patient size and/or use of iterative reconstruction technique. COMPARISON:  Head CT 10/11/2019. FINDINGS: Brain: No acute hemorrhage, mass effect or midline shift. Gray-white differentiation is preserved. No hydrocephalus. No extra-axial collection. Basilar cisterns are patent. Vascular: No hyperdense vessel or unexpected calcification. Skull: No calvarial fracture or suspicious bone lesion. Skull base is unremarkable. Sinuses/Orbits: Unremarkable. Other: None. IMPRESSION: No acute intracranial abnormality. Electronically Signed   By: Emmit Alexanders M.D.   On: 04/10/2022 12:07   DG Chest Portable 1 View  Result Date: 04/10/2022 CLINICAL DATA:  Shortness of breath. EXAM: PORTABLE CHEST 1 VIEW COMPARISON:  03/15/2022 FINDINGS: Right jugular Port-A-Cath is stable with the tip near the superior cavoatrial junction. Heart size is within normal limits and stable. Densities near the left costophrenic angle appear to be chronic. No new airspace disease or pulmonary edema. Negative for a pneumothorax. IMPRESSION: No active disease. Electronically Signed   By: Markus Daft M.D.   On: 04/10/2022 10:05    Scheduled Meds:  aspirin EC  81 mg Oral Daily   buPROPion  300 mg Oral Daily   And   buPROPion  150 mg Oral QHS   Chlorhexidine Gluconate Cloth  6 each Topical Daily   famotidine  20 mg Oral QHS   gabapentin  100 mg Oral TID   levothyroxine  50 mcg Oral Q0600   sodium chloride flush  10-40 mL Intracatheter Q12H   Continuous Infusions:  sodium chloride 75 mL/hr at 04/10/22 1746   cefTRIAXone (ROCEPHIN)  IV 1 g (04/10/22 1600)     LOS: 0 days   Shelly Coss, MD [facility] P2/12/2022, 11:36 AM

## 2022-04-12 DIAGNOSIS — J9611 Chronic respiratory failure with hypoxia: Secondary | ICD-10-CM | POA: Diagnosis present

## 2022-04-12 DIAGNOSIS — K219 Gastro-esophageal reflux disease without esophagitis: Secondary | ICD-10-CM | POA: Diagnosis present

## 2022-04-12 DIAGNOSIS — R918 Other nonspecific abnormal finding of lung field: Secondary | ICD-10-CM | POA: Diagnosis present

## 2022-04-12 DIAGNOSIS — E785 Hyperlipidemia, unspecified: Secondary | ICD-10-CM | POA: Diagnosis present

## 2022-04-12 DIAGNOSIS — F39 Unspecified mood [affective] disorder: Secondary | ICD-10-CM | POA: Diagnosis present

## 2022-04-12 DIAGNOSIS — K900 Celiac disease: Secondary | ICD-10-CM | POA: Diagnosis present

## 2022-04-12 DIAGNOSIS — Z833 Family history of diabetes mellitus: Secondary | ICD-10-CM | POA: Diagnosis not present

## 2022-04-12 DIAGNOSIS — R109 Unspecified abdominal pain: Secondary | ICD-10-CM | POA: Diagnosis not present

## 2022-04-12 DIAGNOSIS — Z1152 Encounter for screening for COVID-19: Secondary | ICD-10-CM | POA: Diagnosis not present

## 2022-04-12 DIAGNOSIS — Z955 Presence of coronary angioplasty implant and graft: Secondary | ICD-10-CM | POA: Diagnosis not present

## 2022-04-12 DIAGNOSIS — J449 Chronic obstructive pulmonary disease, unspecified: Secondary | ICD-10-CM | POA: Diagnosis present

## 2022-04-12 DIAGNOSIS — C3492 Malignant neoplasm of unspecified part of left bronchus or lung: Secondary | ICD-10-CM | POA: Diagnosis present

## 2022-04-12 DIAGNOSIS — J4489 Other specified chronic obstructive pulmonary disease: Secondary | ICD-10-CM | POA: Diagnosis present

## 2022-04-12 DIAGNOSIS — M797 Fibromyalgia: Secondary | ICD-10-CM | POA: Diagnosis present

## 2022-04-12 DIAGNOSIS — E876 Hypokalemia: Secondary | ICD-10-CM | POA: Diagnosis not present

## 2022-04-12 DIAGNOSIS — I2510 Atherosclerotic heart disease of native coronary artery without angina pectoris: Secondary | ICD-10-CM | POA: Diagnosis present

## 2022-04-12 DIAGNOSIS — D509 Iron deficiency anemia, unspecified: Secondary | ICD-10-CM | POA: Diagnosis present

## 2022-04-12 DIAGNOSIS — D638 Anemia in other chronic diseases classified elsewhere: Secondary | ICD-10-CM | POA: Diagnosis present

## 2022-04-12 DIAGNOSIS — I129 Hypertensive chronic kidney disease with stage 1 through stage 4 chronic kidney disease, or unspecified chronic kidney disease: Secondary | ICD-10-CM | POA: Diagnosis present

## 2022-04-12 DIAGNOSIS — Z9981 Dependence on supplemental oxygen: Secondary | ICD-10-CM | POA: Diagnosis not present

## 2022-04-12 DIAGNOSIS — K580 Irritable bowel syndrome with diarrhea: Secondary | ICD-10-CM | POA: Diagnosis not present

## 2022-04-12 DIAGNOSIS — E039 Hypothyroidism, unspecified: Secondary | ICD-10-CM | POA: Diagnosis present

## 2022-04-12 DIAGNOSIS — N390 Urinary tract infection, site not specified: Secondary | ICD-10-CM | POA: Diagnosis present

## 2022-04-12 DIAGNOSIS — N1832 Chronic kidney disease, stage 3b: Secondary | ICD-10-CM | POA: Diagnosis present

## 2022-04-12 LAB — BASIC METABOLIC PANEL
Anion gap: 9 (ref 5–15)
BUN: 12 mg/dL (ref 8–23)
CO2: 23 mmol/L (ref 22–32)
Calcium: 8.1 mg/dL — ABNORMAL LOW (ref 8.9–10.3)
Chloride: 109 mmol/L (ref 98–111)
Creatinine, Ser: 1.14 mg/dL — ABNORMAL HIGH (ref 0.44–1.00)
GFR, Estimated: 53 mL/min — ABNORMAL LOW (ref >60)
Glucose, Bld: 133 mg/dL — ABNORMAL HIGH (ref 70–99)
Potassium: 3.5 mmol/L (ref 3.5–5.1)
Sodium: 141 mmol/L (ref 135–145)

## 2022-04-12 LAB — IRON AND TIBC
Iron: 27 ug/dL — ABNORMAL LOW (ref 28–170)
Saturation Ratios: 9 % — ABNORMAL LOW (ref 10.4–31.8)
TIBC: 301 ug/dL (ref 250–450)
UIBC: 274 ug/dL

## 2022-04-12 LAB — CBC
HCT: 25.6 % — ABNORMAL LOW (ref 36.0–46.0)
Hemoglobin: 7.9 g/dL — ABNORMAL LOW (ref 12.0–15.0)
MCH: 30.5 pg (ref 26.0–34.0)
MCHC: 30.9 g/dL (ref 30.0–36.0)
MCV: 98.8 fL (ref 80.0–100.0)
Platelets: 232 10*3/uL (ref 150–400)
RBC: 2.59 MIL/uL — ABNORMAL LOW (ref 3.87–5.11)
RDW: 16.2 % — ABNORMAL HIGH (ref 11.5–15.5)
WBC: 4.4 10*3/uL (ref 4.0–10.5)
nRBC: 0 % (ref 0.0–0.2)

## 2022-04-12 LAB — OCCULT BLOOD X 1 CARD TO LAB, STOOL: Fecal Occult Bld: NEGATIVE

## 2022-04-12 MED ORDER — SODIUM CHLORIDE 0.9 % IV SOLN
510.0000 mg | Freq: Once | INTRAVENOUS | Status: AC
  Administered 2022-04-12: 510 mg via INTRAVENOUS
  Filled 2022-04-12: qty 510

## 2022-04-12 MED ORDER — PANTOPRAZOLE SODIUM 40 MG IV SOLR
40.0000 mg | Freq: Two times a day (BID) | INTRAVENOUS | Status: DC
  Administered 2022-04-12 – 2022-04-13 (×3): 40 mg via INTRAVENOUS
  Filled 2022-04-12 (×3): qty 10

## 2022-04-12 MED ORDER — UMECLIDINIUM BROMIDE 62.5 MCG/ACT IN AEPB
1.0000 | INHALATION_SPRAY | Freq: Every day | RESPIRATORY_TRACT | Status: DC
  Administered 2022-04-12 – 2022-04-13 (×2): 1 via RESPIRATORY_TRACT
  Filled 2022-04-12: qty 7

## 2022-04-12 MED ORDER — ALBUTEROL SULFATE (2.5 MG/3ML) 0.083% IN NEBU: 3.0000 mL | INHALATION_SOLUTION | Freq: Four times a day (QID) | RESPIRATORY_TRACT | Status: DC | PRN

## 2022-04-12 MED ORDER — BUDESON-GLYCOPYRROL-FORMOTEROL 160-9-4.8 MCG/ACT IN AERO: 2.0000 | INHALATION_SPRAY | Freq: Two times a day (BID) | RESPIRATORY_TRACT | Status: DC

## 2022-04-12 MED ORDER — POTASSIUM CHLORIDE CRYS ER 20 MEQ PO TBCR
40.0000 meq | EXTENDED_RELEASE_TABLET | Freq: Once | ORAL | Status: AC
  Administered 2022-04-12: 40 meq via ORAL
  Filled 2022-04-12: qty 2

## 2022-04-12 MED ORDER — IPRATROPIUM-ALBUTEROL 0.5-2.5 (3) MG/3ML IN SOLN: 3.0000 mL | Freq: Four times a day (QID) | RESPIRATORY_TRACT | Status: DC | PRN

## 2022-04-12 MED ORDER — BENZONATATE 100 MG PO CAPS: 200.0000 mg | ORAL_CAPSULE | Freq: Three times a day (TID) | ORAL | Status: DC | PRN

## 2022-04-12 MED ORDER — FLUTICASONE FUROATE-VILANTEROL 200-25 MCG/ACT IN AEPB
1.0000 | INHALATION_SPRAY | Freq: Every day | RESPIRATORY_TRACT | Status: DC
  Administered 2022-04-12 – 2022-04-13 (×2): 1 via RESPIRATORY_TRACT
  Filled 2022-04-12: qty 28

## 2022-04-12 MED ORDER — ALBUTEROL SULFATE (5 MG/ML) 0.5% IN NEBU: 2.5000 mg | INHALATION_SOLUTION | Freq: Four times a day (QID) | RESPIRATORY_TRACT | Status: DC | PRN

## 2022-04-12 NOTE — Progress Notes (Signed)
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  Also reported subjective chills at home.  UA done on presentation was noticed only suggestive of UTI.  CT chest/abdomen/pelvis did not show any acute pathology that could explain her abdominal pain.  Patient admitted for further management.  Assessment & Plan:  Principal Problem:   Abdominal pain   Abdominal pain: Unclear etiology.  CT abdomen/pelvis did not show any acute findings.  On examination this morning her abdomen is benign, soft, nontender, bowel sounds are present. Her abdomen pain has resolved.  Diarrhea: New problem.  States she had diarrhea 10 times but they were  not documented.  Abdomen is benign on examination.  We are checking GI pathogen panel.  Suspected UTI: UA done on admission was no source of UTI.  Empirically started on ceftriaxone.  Urine culture never sent.  Patient denies any dysuria.  No fever or leukocytosis.  We discontinued antibiotics  Normocytic anemia: Baseline hemoglobin around 9-10.  Hemoglobin dropped to the range of 7.9 today.  No evidence of acute blood loss.  She denies any hematochezia or melena.  This could be associated with her malignancy.  Iron levels showed iron deficiency, being given IV iron.  We are checking FOBT also.  Son told us that she was taking a lot of ibuprofen due to pain.  Will continue Protonix IV twice daily for now  History is for adenocarcinoma of left lung: Follows with Dr. Julien Nordmann.  CT chest showed lung nodules.  Will recommend follow-up with oncology as an outpatient  Hypothyroidism: Continue Synthyroid  Hypertension: On Lopressor.  Blood  pressure stable so on hold  Chronic hypoxic respiratory failure: On 2  to 4 L of oxygen at home chronically.  History of CKD stage IIIb: Currently kidney function  at baseline.  Baseline creatinine ranges from 1.4-2.  Mood disorder: On Wellbutrin  Hypokalemia: Supplemented with potassium        DVT prophylaxis:Place and maintain sequential compression device Start: 04/10/22 1449     Code Status: Prior  Family Communication:: Discussed with son on phone on 2/11  Patient status: OBSERVATION  Patient is from : Home  Anticipated discharge to: Home  Estimated DC date: Tomorrow   Consultants: None  Procedures: None  Antimicrobials:  Anti-infectives (From admission, onward)    Start     Dose/Rate Route Frequency Ordered Stop   04/10/22 1500  cefTRIAXone (ROCEPHIN) 1 g in sodium chloride 0.9 % 100 mL IVPB  Status:  Discontinued        1 g 200 mL/hr over 30 Minutes Intravenous Every 24 hours 04/10/22 1435 04/11/22 1143       Subjective: Patient seen and examined at the bedside today.  She appeared comfortable.  Denies any abdominal pain today.  But she complains of diarrhea this morning.  Abdomen was benign on examination.  Objective: Vitals:   04/11/22 1358 04/11/22 2158 04/12/22 0506 04/12/22 0612  BP: 126/77 128/79  115/65  Pulse: 98 93  95  Resp: 16 16  16   Temp: 99 F (37.2 C) 98.4 F (36.9 C)  98.9 F (37.2 C)  TempSrc: Oral Oral  Oral  SpO2: 100% 99% 99% 96%  Intake/Output Summary (Last 24 hours) at 04/12/2022 1137 Last data filed at 04/12/2022 0905 Gross per 24 hour  Intake 350 ml  Output 4 ml  Net 346 ml   There were no vitals filed for this visit.  Examination:   General exam: Overall comfortable, not in distress, deconditioned, chronically ill looking HEENT: PERRL Respiratory system: Diminished sounds bilaterally but no wheezes or crackles  Cardiovascular system: S1 & S2 heard, RRR.  Gastrointestinal system: Abdomen is nondistended, soft  and nontender. Central nervous system: Alert and oriented Extremities: No edema, no clubbing ,no cyanosis Skin: No rashes, no ulcers,no icterus     Data Reviewed: I have personally reviewed following labs and imaging studies  CBC: Recent Labs  Lab 04/10/22 1012 04/11/22 0526 04/12/22 0609  WBC 5.0 4.1 4.4  NEUTROABS 2.8  --   --   HGB 9.0* 8.1* 7.9*  HCT 28.9* 26.2* 25.6*  MCV 96.3 98.1 98.8  PLT 296 251 299   Basic Metabolic Panel: Recent Labs  Lab 04/10/22 1012 04/11/22 0526 04/12/22 0609  NA 138 142 141  K 3.5 3.4* 3.5  CL 103 110 109  CO2 25 25 23   GLUCOSE 153* 124* 133*  BUN 18 17 12   CREATININE 1.19* 1.11* 1.14*  CALCIUM 8.6* 8.2* 8.1*  MG  --  1.9  --   PHOS  --  3.2  --      Recent Results (from the past 240 hour(s))  Resp panel by RT-PCR (RSV, Flu A&B, Covid) Anterior Nasal Swab     Status: None   Collection Time: 04/10/22  1:36 PM   Specimen: Anterior Nasal Swab  Result Value Ref Range Status   SARS Coronavirus 2 by RT PCR NEGATIVE NEGATIVE Final    Comment: (NOTE) SARS-CoV-2 target nucleic acids are NOT DETECTED.  The SARS-CoV-2 RNA is generally detectable in upper respiratory specimens during the acute phase of infection. The lowest concentration of SARS-CoV-2 viral copies this assay can detect is 138 copies/mL. A negative result does not preclude SARS-Cov-2 infection and should not be used as the sole basis for treatment or other patient management decisions. A negative result may occur with  improper specimen collection/handling, submission of specimen other than nasopharyngeal swab, presence of viral mutation(s) within the areas targeted by this assay, and inadequate number of viral copies(<138 copies/mL). A negative result must be combined with clinical observations, patient history, and epidemiological information. The expected result is Negative.  Fact Sheet for Patients:  EntrepreneurPulse.com.au  Fact Sheet for  Healthcare Providers:  IncredibleEmployment.be  This test is no t yet approved or cleared by the [place] FDA and  has been authorized for detection and/or diagnosis of SARS-CoV-2 by FDA under an Emergency Use Authorization (EUA). This EUA will remain  in effect (meaning this test can be used) for the duration of the COVID-19 declaration under Section 564(b)(1) of the Act, 21 U.S.C.section 360bbb-3(b)(1), unless the authorization is terminated  or revoked sooner.       Influenza A by PCR NEGATIVE NEGATIVE Final   Influenza B by PCR NEGATIVE NEGATIVE Final    Comment: (NOTE) The Xpert Xpress SARS-CoV-2/FLU/RSV plus assay is intended as an aid in the diagnosis of influenza from Nasopharyngeal swab specimens and should not be used as a sole basis for treatment. Nasal washings and aspirates are unacceptable for Xpert Xpress SARS-CoV-2/FLU/RSV testing.  Fact Sheet for Patients: EntrepreneurPulse.com.au  Fact Sheet for Healthcare Providers: IncredibleEmployment.be  This test is not yet approved or cleared by the  [place] Architectural technologist and has been authorized for detection and/or diagnosis of SARS-CoV-2 by FDA under an Print production planner (EUA). This EUA will remain in effect (meaning this test can be used) for the duration of the COVID-19 declaration under Section 564(b)(1) of the Act, 21 U.S.C. section 360bbb-3(b)(1), unless the authorization is terminated or revoked.     Resp Syncytial Virus by PCR NEGATIVE NEGATIVE Final    Comment: (NOTE) Fact Sheet for Patients: EntrepreneurPulse.com.au  Fact Sheet for Healthcare Providers: IncredibleEmployment.be  This test is not yet approved or cleared by the [place] FDA and has been authorized for detection and/or diagnosis of SARS-CoV-2 by FDA under an Emergency Use Authorization (EUA). This EUA will remain in effect (meaning this  test can be used) for the duration of the COVID-19 declaration under Section 564(b)(1) of the Act, 21 U.S.C. section 360bbb-3(b)(1), unless the authorization is terminated or revoked.  Performed at [facility], [place] [address], [place], [place] [address]      Radiology Studies: MR BRAIN WO CONTRAST  Result Date: 04/10/2022 CLINICAL DATA:  Mental status change, unknown cause. EXAM: MRI HEAD WITHOUT CONTRAST TECHNIQUE: Multiplanar, multiecho pulse sequences of the brain and surrounding structures were obtained without intravenous contrast. COMPARISON:  MRI brain 03/06/2022. FINDINGS: Brain: Motion degraded study. No acute infarct or hemorrhage. No mass or midline shift. Few areas of T2 hyperintensity in the cerebral white matter unchanged and likely due to chronic small-vessel disease. No hydrocephalus or extra-axial collection. Vascular: Normal flow voids. Skull and upper cervical spine: Normal marrow signal. Sinuses/Orbits: Mucous retention cyst in the left maxillary sinus. Other: None. IMPRESSION: No acute intracranial abnormality or mass. Electronically Signed   By: Emmit Alexanders M.D.   On: 04/10/2022 15:28   CT Angio Chest PE W and/or Wo Contrast  Result Date: 04/10/2022 CLINICAL DATA:  Pulmonary embolism (PE) suspected, low to intermediate prob, positive D-dimer; Abdominal pain, acute, nonlocalized. Mental status change. Dyspnea. History of lung cancer. * Tracking Code: BO * EXAM: CT ANGIOGRAPHY CHEST CT ABDOMEN AND PELVIS WITH CONTRAST TECHNIQUE: Multidetector CT imaging of the chest was performed using the standard protocol during bolus administration of intravenous contrast. Multiplanar CT image reconstructions and MIPs were obtained to evaluate the vascular anatomy. Multidetector CT imaging of the abdomen and pelvis was performed using the standard protocol during bolus administration of intravenous contrast. RADIATION DOSE REDUCTION: This exam was performed according to  the departmental dose-optimization program which includes automated exposure control, adjustment of the mA and/or kV according to patient size and/or use of iterative reconstruction technique. CONTRAST:  161mL OMNIPAQUE IOHEXOL 350 MG/ML SOLN COMPARISON:  03/20/2022 chest CT. 02/12/2022 PET-CT. 10/04/2018 CT abdomen/pelvis. FINDINGS: CTA CHEST FINDINGS Cardiovascular: The study is moderate quality for the evaluation of pulmonary embolism, limited by significant motion degradation. There are no filling defects in the central, lobar, segmental or subsegmental pulmonary artery branches to suggest acute pulmonary embolism. Great vessels are normal in course and caliber. Normal heart size. No significant pericardial fluid/thickening. Left anterior descending and right coronary atherosclerosis/stents. Right internal jugular Port-A-Cath terminates at the cavoatrial junction. Mediastinum/Nodes: No significant thyroid nodules. Unremarkable esophagus. No pathologically enlarged axillary, mediastinal or hilar lymph nodes. Lungs/Pleura: No pneumothorax. No pleural effusion. Severe centrilobular emphysema. Sub solid 2.3 x 1.7 cm peripheral right upper lobe pulmonary nodule (series 11/image 54), stable from recent 03/20/2022 chest CT using similar measurement technique. Sub solid 1.4 x 1.1 cm superior segment left lower lobe nodule (series 11/image 44), stable. No acute consolidative  airspace disease or new significant pulmonary nodules. Musculoskeletal: No aggressive appearing focal osseous lesions. Mild thoracic spondylosis. Chronic mild T5 vertebral compression fracture. Review of the MIP images confirms the above findings. CT ABDOMEN and PELVIS FINDINGS Hepatobiliary: Normal liver with no liver mass. Cholecystectomy. No biliary ductal dilatation. Pancreas: Normal, with no mass or duct dilation. Spleen: Normal size. No mass. Adrenals/Urinary Tract: Normal adrenals. Normal kidneys with no hydronephrosis and no renal mass.  Normal bladder. Stomach/Bowel: Normal non-distended stomach. Normal caliber small bowel with no small bowel wall thickening. Normal appendix. Scattered mild colonic diverticulosis with no large bowel wall thickening or significant pericolonic fat stranding. Vascular/Lymphatic: Atherosclerotic nonaneurysmal abdominal aorta. Patent portal, splenic, hepatic and renal veins. No pathologically enlarged lymph nodes in the abdomen or pelvis. Reproductive: Status post hysterectomy, with no abnormal findings at the vaginal cuff. Simple 3.0 cm left adnexal cyst (series 5/image 75), new from 02/12/2022 PET-CT. No right adnexal mass. Other: No pneumoperitoneum, ascites or focal fluid collection. Musculoskeletal: No aggressive appearing focal osseous lesions. Marked degenerative disc disease at L5-S1. Review of the MIP images confirms the above findings. IMPRESSION: 1. Limited motion degraded scan.  No evidence of pulmonary embolism. 2. Subsolid 2.3 cm peripheral right upper lobe and 1.4 cm superior segment left lower lobe pulmonary nodules, unchanged from recent 03/20/2022 chest CT. Multifocal pulmonary malignancy not excluded. No thoracic adenopathy. No evidence of metastatic disease in the abdomen or pelvis. 3. No acute pulmonary disease. No acute abnormality in the abdomen or pelvis. No evidence of bowel obstruction or acute bowel inflammation. Mild colonic diverticulosis. 4. Simple 3.0 cm left adnexal cyst, new from 02/12/2022 PET-CT. No follow-up imaging is recommended. Reference: JACR 2020 Feb;17(2):248-254 5. Aortic Atherosclerosis (ICD10-I70.0) and Emphysema (ICD10-J43.9). Electronically Signed   By: Ilona Sorrel M.D.   On: 04/10/2022 12:25   CT ABDOMEN PELVIS W CONTRAST  Result Date: 04/10/2022 CLINICAL DATA:  Pulmonary embolism (PE) suspected, low to intermediate prob, positive D-dimer; Abdominal pain, acute, nonlocalized. Mental status change. Dyspnea. History of lung cancer. * Tracking Code: BO * EXAM: CT  ANGIOGRAPHY CHEST CT ABDOMEN AND PELVIS WITH CONTRAST TECHNIQUE: Multidetector CT imaging of the chest was performed using the standard protocol during bolus administration of intravenous contrast. Multiplanar CT image reconstructions and MIPs were obtained to evaluate the vascular anatomy. Multidetector CT imaging of the abdomen and pelvis was performed using the standard protocol during bolus administration of intravenous contrast. RADIATION DOSE REDUCTION: This exam was performed according to the departmental dose-optimization program which includes automated exposure control, adjustment of the mA and/or kV according to patient size and/or use of iterative reconstruction technique. CONTRAST:  170mL OMNIPAQUE IOHEXOL 350 MG/ML SOLN COMPARISON:  03/20/2022 chest CT. 02/12/2022 PET-CT. 10/04/2018 CT abdomen/pelvis. FINDINGS: CTA CHEST FINDINGS Cardiovascular: The study is moderate quality for the evaluation of pulmonary embolism, limited by significant motion degradation. There are no filling defects in the central, lobar, segmental or subsegmental pulmonary artery branches to suggest acute pulmonary embolism. Great vessels are normal in course and caliber. Normal heart size. No significant pericardial fluid/thickening. Left anterior descending and right coronary atherosclerosis/stents. Right internal jugular Port-A-Cath terminates at the cavoatrial junction. Mediastinum/Nodes: No significant thyroid nodules. Unremarkable esophagus. No pathologically enlarged axillary, mediastinal or hilar lymph nodes. Lungs/Pleura: No pneumothorax. No pleural effusion. Severe centrilobular emphysema. Sub solid 2.3 x 1.7 cm peripheral right upper lobe pulmonary nodule (series 11/image 54), stable from recent 03/20/2022 chest CT using similar measurement technique. Sub solid 1.4 x 1.1 cm superior segment left lower lobe nodule (  series 11/image 44), stable. No acute consolidative airspace disease or new significant pulmonary nodules.  Musculoskeletal: No aggressive appearing focal osseous lesions. Mild thoracic spondylosis. Chronic mild T5 vertebral compression fracture. Review of the MIP images confirms the above findings. CT ABDOMEN and PELVIS FINDINGS Hepatobiliary: Normal liver with no liver mass. Cholecystectomy. No biliary ductal dilatation. Pancreas: Normal, with no mass or duct dilation. Spleen: Normal size. No mass. Adrenals/Urinary Tract: Normal adrenals. Normal kidneys with no hydronephrosis and no renal mass. Normal bladder. Stomach/Bowel: Normal non-distended stomach. Normal caliber small bowel with no small bowel wall thickening. Normal appendix. Scattered mild colonic diverticulosis with no large bowel wall thickening or significant pericolonic fat stranding. Vascular/Lymphatic: Atherosclerotic nonaneurysmal abdominal aorta. Patent portal, splenic, hepatic and renal veins. No pathologically enlarged lymph nodes in the abdomen or pelvis. Reproductive: Status post hysterectomy, with no abnormal findings at the vaginal cuff. Simple 3.0 cm left adnexal cyst (series 5/image 75), new from 02/12/2022 PET-CT. No right adnexal mass. Other: No pneumoperitoneum, ascites or focal fluid collection. Musculoskeletal: No aggressive appearing focal osseous lesions. Marked degenerative disc disease at L5-S1. Review of the MIP images confirms the above findings. IMPRESSION: 1. Limited motion degraded scan.  No evidence of pulmonary embolism. 2. Subsolid 2.3 cm peripheral right upper lobe and 1.4 cm superior segment left lower lobe pulmonary nodules, unchanged from recent 03/20/2022 chest CT. Multifocal pulmonary malignancy not excluded. No thoracic adenopathy. No evidence of metastatic disease in the abdomen or pelvis. 3. No acute pulmonary disease. No acute abnormality in the abdomen or pelvis. No evidence of bowel obstruction or acute bowel inflammation. Mild colonic diverticulosis. 4. Simple 3.0 cm left adnexal cyst, new from 02/12/2022 PET-CT. No  follow-up imaging is recommended. Reference: JACR 2020 Feb;17(2):248-254 5. Aortic Atherosclerosis (ICD10-I70.0) and Emphysema (ICD10-J43.9). Electronically Signed   By: Ilona Sorrel M.D.   On: 04/10/2022 12:25   CT Head Wo Contrast  Result Date: 04/10/2022 CLINICAL DATA:  Mental status change, unknown cause. EXAM: CT HEAD WITHOUT CONTRAST TECHNIQUE: Contiguous axial images were obtained from the base of the skull through the vertex without intravenous contrast. RADIATION DOSE REDUCTION: This exam was performed according to the departmental dose-optimization program which includes automated exposure control, adjustment of the mA and/or kV according to patient size and/or use of iterative reconstruction technique. COMPARISON:  Head CT 10/11/2019. FINDINGS: Brain: No acute hemorrhage, mass effect or midline shift. Gray-white differentiation is preserved. No hydrocephalus. No extra-axial collection. Basilar cisterns are patent. Vascular: No hyperdense vessel or unexpected calcification. Skull: No calvarial fracture or suspicious bone lesion. Skull base is unremarkable. Sinuses/Orbits: Unremarkable. Other: None. IMPRESSION: No acute intracranial abnormality. Electronically Signed   By: Emmit Alexanders M.D.   On: 04/10/2022 12:07    Scheduled Meds:  aspirin EC  81 mg Oral Daily   buPROPion  300 mg Oral Daily   And   buPROPion  150 mg Oral QHS   Chlorhexidine Gluconate Cloth  6 each Topical Daily   famotidine  20 mg Oral QHS   gabapentin  100 mg Oral TID   levothyroxine  50 mcg Oral Q0600   sodium chloride flush  10-40 mL Intracatheter Q12H   Continuous Infusions:  ferumoxytol (FERAHEME) 510 mg in sodium chloride 0.9 % 100 mL IVPB       LOS: 0 days   Shelly Coss, MD [facility] P2/01/2023, 11:37 AM

## 2022-04-12 NOTE — TOC Initial Note (Signed)
Transition of Care [facility]) - Initial/Assessment Note    Patient Details  Name: [name] MRN: [id number] Date of Birth: 12-28-1955  Transition [facility] Care [facility] CM/SW Contact:    Henrietta Dine, RN Phone Number: 04/12/2022, 12:21 PM  Clinical Narrative:                 [facility] consult for d/c planning; pt says she is from home but she will d/c to her son's house; she identified POC Brooklynne Pereida (son) [phone]; pt says she wears glasses and dentures (upper/lower); she does not have HA; she says she has a cane, Rolator,  BSC, manual wheel chair, and shower chair; she is not sure if there are bars in her son's shower; pt denies IPV, food insecurity, or difficulty paying utilities; pt says she will con't home oxygen w/ [employer]; she says she has a travel tank and it is full; pt says she is not sure if she will con't HHPT/OT w/ [facility];r Enzo Montgomery at [place] and [employer] at [place] notified; they were both given son's phone #; TOC will follow. Expected Discharge Plan: [place] Barriers to Discharge: Continued Medical Work up   Patient Goals and CMS Choice Patient states their goals for this hospitalization and ongoing recovery are:: going home w/ son          Expected Discharge Plan and Services   Discharge Planning Services: CM Consult Post Acute Care Choice: Durable Medical Equipment, Resumption of Svcs/PTA Provider (pt says she will con't home oxygen w/ [employer]; pt says she is not sure if she will con't HHPT/OT w/ [facility]) Living arrangements for the past 2 months: [place]                                      Prior Living Arrangements/Services Living arrangements for the past 2 months: Single Family Home Lives with:: Self Patient language and need for interpreter reviewed:: Yes Do you feel safe going back to the place where you live?: Yes      Need for Family Participation in Patient Care: Yes (Comment) Care giver support system in  place?: Yes (comment) Current home services: Home OT, Home PT, DME (pt says she will con't home oxygen w/ [employer]; pt says she is not sure if she will con't HHPT/OT w/ [facility]) Criminal Activity/Legal Involvement Pertinent to Current Situation/Hospitalization: No - Comment as needed  Activities of Daily Living Home Assistive Devices/Equipment: Eyeglasses, Dentures (specify type), Oxygen ADL Screening (condition at time of admission) Patient's cognitive ability adequate to safely complete daily activities?: Yes Is the patient deaf or have difficulty hearing?: No Does the patient have difficulty seeing, even when wearing glasses/contacts?: No Does the patient have difficulty concentrating, remembering, or making decisions?: No Patient able to express need for assistance with ADLs?: Yes Does the patient have difficulty dressing or bathing?: No Independently performs ADLs?: Yes (appropriate for developmental age) Does the patient have difficulty walking or climbing stairs?: No Weakness of Legs: None Weakness of Arms/Hands: None  Permission Sought/Granted Permission sought to share information with : Case Manager Permission granted to share information with : Yes, Verbal Permission Granted  Share Information with NAME: Lenor Coffin, RN, CM     Permission granted to share info w Relationship: Deserea Bordley (son) [phone]     Emotional Assessment Appearance:: Appears stated age Attitude/Demeanor/Rapport: Gracious Affect (typically observed): Accepting Orientation: :  Oriented to Self, Oriented to Situation Alcohol / Substance Use: Not Applicable Psych Involvement: No (comment)  Admission diagnosis:  Abdominal pain [R10.9] Altered mental status, unspecified altered mental status type [R41.82] Left upper quadrant abdominal pain [R10.12] Patient Active Problem List   Diagnosis Date Noted   Abdominal pain 04/10/2022   Shingles 03/24/2022   Acute on chronic respiratory failure with  hypoxia ([place]) 03/24/2022   Influenza A with pneumonia 03/24/2022   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 ([place]) 03/24/2022   COPD with acute exacerbation ([place]) 03/24/2022   New onset type 2 diabetes mellitus ([place]) 03/24/2022   Leukocytosis 03/24/2022   Acute renal failure superimposed on stage 3b chronic kidney disease ([place]) 03/24/2022   Anemia of chronic disease 03/24/2022   Elevated liver enzymes 03/24/2022   Essential hypertension 03/24/2022   Influenza A 03/15/2022   COPD (chronic obstructive pulmonary disease) ([place]) 01/27/2021   Chronic respiratory failure with hypoxia ([place]) 01/27/2021   Anemia associated with chemotherapy 10/07/2018   Thrombocytopenia ([place]) 10/07/2018   Acute pyelonephritis 10/06/2018   Acute lower UTI    Diarrhea 06/20/2018   Tardive dyskinesia 06/20/2018   Medication side effect 06/20/2018   Nausea and vomiting 05/11/2018   AKI (acute kidney injury) ([place]) 05/11/2018   Port-A-Cath in place 05/02/2018   Adenocarcinoma of left lung, stage 4 ([place]) 04/05/2018   Encounter for antineoplastic chemotherapy 04/05/2018   Encounter for antineoplastic immunotherapy 04/05/2018   Goals of care, counseling/discussion 04/05/2018   Tobacco abuse counseling 03/05/2018   PVC's (premature ventricular contractions)    SVT (supraventricular tachycardia)    Premature atrial contractions    Unstable angina ([place]) 12/07/2012   Tachycardia 02/19/2009   CAD, NATIVE VESSEL 06/04/2008   Hyperlipemia 05/29/2008   DEPRESSION 05/29/2008   Asthma 05/29/2008   GERD 05/29/2008   Irritable bowel syndrome 05/29/2008   Spondylosis 05/29/2008   FIBROMYALGIA 05/29/2008   CHEST PAIN-PRECORDIAL 05/07/2008   PCP:  [name], NP Pharmacy:   [facility] [place], [place] - [address] AT [facility] [address] & [place] [place] [address] Phone: [phone] Fax: [phone]  [facility], [place] [address] Phone: [phone] Fax: [place] [address] Phone: [phone] Fax: [place], [place] - [address] AT [address] [address] Phone: [phone] Fax: [phone]     Social Determinants of Health (SDOH) Social History: SDOH Screenings   Food Insecurity: No Food Insecurity (04/12/2022)  Housing: Low Risk  (04/12/2022)  Transportation Needs: No Transportation Needs (03/16/2022)  Utilities: Not At Risk (04/12/2022)  Tobacco Use: Medium Risk (04/10/2022)   SDOH Interventions: Food Insecurity Interventions: Inpatient TOC Housing Interventions: Inpatient TOC Utilities Interventions: Inpatient TOC   Readmission Risk Interventions    03/19/2022   12:16 PM 03/19/2022   11:41 AM  Readmission Risk Prevention Plan  Transportation Screening Complete Complete  PCP or Specialist Appt within 5-7 Days  Complete  Home Care Screening  Complete  Medication Review (RN CM)  Complete

## 2022-04-12 NOTE — Progress Notes (Signed)
Mobility Specialist - Progress Note   04/12/22 1104  Oxygen Therapy  O2 Device Nasal Cannula  O2 Flow Rate (L/min) 3 L/min  Mobility  Activity Ambulated with assistance in hallway  Level of Assistance Modified independent, requires aide device or extra time  Assistive Device Front wheel walker  Distance Ambulated (ft) 250 ft  Activity Response Tolerated well  Mobility Referral Yes  $Mobility charge 1 Mobility   Pt received in bed and agreeable to mobility. No complaints during session. Pt to bed after session with all needs met.    During mobility: 95% SpO2 (3L [place]) Post-mobility: 100% SPO2 (3L [place])  Danelle Berry Mobility Specialist   [facility] Mobility Specialist

## 2022-04-13 DIAGNOSIS — R109 Unspecified abdominal pain: Secondary | ICD-10-CM | POA: Diagnosis not present

## 2022-04-13 LAB — BASIC METABOLIC PANEL
Anion gap: 8 (ref 5–15)
BUN: 11 mg/dL (ref 8–23)
CO2: 26 mmol/L (ref 22–32)
Calcium: 8.8 mg/dL — ABNORMAL LOW (ref 8.9–10.3)
Chloride: 106 mmol/L (ref 98–111)
Creatinine, Ser: 1.3 mg/dL — ABNORMAL HIGH (ref 0.44–1.00)
GFR, Estimated: 45 mL/min — ABNORMAL LOW (ref >60)
Glucose, Bld: 102 mg/dL — ABNORMAL HIGH (ref 70–99)
Potassium: 3.9 mmol/L (ref 3.5–5.1)
Sodium: 140 mmol/L (ref 135–145)

## 2022-04-13 LAB — GASTROINTESTINAL PANEL BY PCR, STOOL (REPLACES STOOL CULTURE)

## 2022-04-13 LAB — CBC
HCT: 26.6 % — ABNORMAL LOW (ref 36.0–46.0)
Hemoglobin: 8.1 g/dL — ABNORMAL LOW (ref 12.0–15.0)
MCH: 30.1 pg (ref 26.0–34.0)
MCHC: 30.5 g/dL (ref 30.0–36.0)
MCV: 98.9 fL (ref 80.0–100.0)
Platelets: 265 10*3/uL (ref 150–400)
RBC: 2.69 MIL/uL — ABNORMAL LOW (ref 3.87–5.11)
RDW: 16.2 % — ABNORMAL HIGH (ref 11.5–15.5)
WBC: 4.4 10*3/uL (ref 4.0–10.5)
nRBC: 0 % (ref 0.0–0.2)

## 2022-04-13 MED ORDER — HEPARIN SOD (PORK) LOCK FLUSH 100 UNIT/ML IV SOLN
500.0000 [IU] | Freq: Once | INTRAVENOUS | Status: AC
  Administered 2022-04-13: 500 [IU] via INTRAVENOUS
  Filled 2022-04-13: qty 5

## 2022-04-13 MED ORDER — PANTOPRAZOLE SODIUM 40 MG PO TBEC: 40.0000 mg | DELAYED_RELEASE_TABLET | Freq: Every day | ORAL | 0 refills | Status: DC

## 2022-04-13 MED ORDER — LOPERAMIDE HCL 2 MG PO TABS: 2.0000 mg | ORAL_TABLET | Freq: Four times a day (QID) | ORAL | 0 refills | Status: DC | PRN

## 2022-04-13 MED ORDER — FERROUS SULFATE 325 (65 FE) MG PO TBEC: 325.0000 mg | DELAYED_RELEASE_TABLET | Freq: Every day | ORAL | 1 refills | Status: DC

## 2022-04-13 MED ORDER — DICYCLOMINE HCL 10 MG PO CAPS: 10.0000 mg | ORAL_CAPSULE | Freq: Three times a day (TID) | ORAL | 0 refills | Status: DC | PRN

## 2022-04-13 NOTE — Discharge Summary (Signed)
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 Also reported subjective chills at home. UA done on presentation was noticed only suggestive of UTI. CT chest/abdomen/pelvis did not show any acute pathology that could explain her abdominal pain. Patient admitted for further management.  Patient's abdomen pain gradually improved.  Hospital course also remarkable for diarrhea which also has improved.  Patient is medically stable for discharge home.  Following problems were addressed during the hospitalization:  Abdominal pain: Unclear etiology.  CT abdomen/pelvis did not show any acute findings.  On examination this morning her abdomen is benign, soft, nontender, bowel sounds are present. Her abdomen pain has resolved.   Diarrhea:  Abdomen is benign on examination.  Negative GI pathogen panel.  Continue Imodium as needed   Suspected UTI: UA done on admission was no source of UTI.  Empirically started on ceftriaxone.  Urine culture never sent.  Patient denies any dysuria.  No fever or leukocytosis.  We discontinued antibiotics   Normocytic anemia: Baseline hemoglobin around 9-10.  Hemoglobin dropped to the range of 7-8.  No evidence of acute blood loss.  She denies any hematochezia or melena.  FOBT negative. this could be associated with her malignancy.  Iron levels showed iron deficiency,  given IV iron.  Continue Protonix, iron supplementation  History is for adenocarcinoma of  left lung: Follows with Dr. Julien Nordmann.  CT chest showed lung nodules.  Will recommend follow-up with oncology as an outpatient   Hypothyroidism: Continue Synthyroid   Chronic hypoxic respiratory failure: On 2  to 4 L of oxygen at home chronically.   History of CKD stage IIIb: Currently kidney function  at baseline.  Baseline creatinine ranges from 1.4-2.   Mood disorder: On Wellbutrin   Hypokalemia: Supplemented with potassium  Discharge Diagnoses:  Principal Problem:   Abdominal pain    Discharge Instructions  Discharge Instructions     Diet general   Complete by: As directed    Discharge instructions   Complete by: As directed    1)Please take prescribed medications as instructed 2)Follow up with your PCP in a week   Increase activity slowly   Complete by: As directed       Allergies as of 04/13/2022       Reactions   Ciprofloxacin Hives   Levofloxacin Hives   Statins Other (See Comments)   Elevated liver enzymes   Tricor [fenofibrate] Hives, Itching   Compazine [prochlorperazine] Other (See Comments)   Tardive dyskinesia   Latex Other (See Comments)   Patient states "Messes with my skin."   Codeine Nausea And Vomiting   Metoprolol Other (See Comments)   Does not tolerate beta blockers 10/02/2013-pt states she can tolerate 25 mg and lower        Medication List     STOP taking these medications    famotidine 10 MG tablet Commonly known as: PEPCID   metoprolol tartrate 25 MG tablet Commonly known as: LOPRESSOR       TAKE these medications    albuterol 108 (90 Base) MCG/ACT inhaler  Commonly known as: VENTOLIN HFA Inhale 1 puff into the lungs every 6 (six) hours as needed (wheezing/shortness of breath.).   albuterol (5 MG/ML) 0.5% nebulizer solution Commonly known as: PROVENTIL Take 2.5 mg by nebulization every 6 (six) hours as needed for wheezing.   aspirin 81 MG chewable tablet Chew 1 tablet (81 mg total) by mouth daily.   benzonatate 200 MG  capsule Commonly known as: TESSALON Take 1 capsule (200 mg total) by mouth 3 (three) times daily as needed for cough.   Breztri Aerosphere 160-9-4.8 MCG/ACT Aero Generic drug: Budeson-Glycopyrrol-Formoterol Inhale 2 puffs into the lungs in the morning and at bedtime.   buPROPion 150 MG 24 hr tablet Commonly known as: WELLBUTRIN XL Take 1-2 tablets by mouth 2 (two) times daily. Patient takes 2 tablets (300mg ) in the morning and 1 tablet (150mg ) in the evening   cyanocobalamin 1000 MCG tablet Take 1 tablet (1,000 mcg total) by mouth daily.   dicyclomine 10 MG capsule Commonly known as: BENTYL Take 1 capsule (10 mg total) by mouth every 8 (eight) hours as needed for spasms.   ferrous sulfate 325 (65 FE) MG EC tablet Take 1 tablet (325 mg total) by mouth daily with breakfast.   gabapentin 100 MG capsule Commonly known as: NEURONTIN Take 1 capsule (100 mg total) by mouth 3 (three) times daily for 15 days.   HYDROcodone bit-homatropine 5-1.5 MG/5ML syrup Commonly known as: Hycodan Take 5 mLs by mouth every 6 (six) hours as needed for cough.   HYDROcodone-acetaminophen 5-325 MG tablet Commonly known as: NORCO/VICODIN Take 1 tablet by mouth 4 (four) times daily as needed (pain).   ipratropium-albuterol 0.5-2.5 (3) MG/3ML Soln Commonly known as: DUONEB Inhale 3 mLs into the lungs every 6 (six) hours as needed (Asthma).   Krazati 200 MG tablet Generic drug: adagrasib Take 2 tablets (400 mg total) by mouth 2 (two) times daily.   loperamide 2 MG tablet Commonly known as: Imodium A-D Take 1 tablet (2 mg total) by mouth 4 (four) times daily as needed for diarrhea or loose stools.   LORazepam 2 MG tablet Commonly known as: ATIVAN Take 2 mg by mouth every evening.   metFORMIN 500 MG 24 hr tablet Commonly known as: GLUCOPHAGE-XR Take 1 tablet (500 mg total) by mouth daily with breakfast.   nitroGLYCERIN 0.4 MG SL tablet Commonly known as: Nitrostat Place 1 tablet (0.4 mg total)  under the tongue every 5 (five) minutes as needed for chest pain.   ondansetron 4 MG disintegrating tablet Commonly known as: ZOFRAN-ODT Take 1 tablet (4 mg total) by mouth every 8 (eight) hours as needed for nausea or vomiting.   pantoprazole 40 MG tablet Commonly known as: Protonix Take 1 tablet (40 mg total) by mouth daily.   senna-docusate 8.6-50 MG tablet Commonly known as: Senokot-S Take 1 tablet by mouth at bedtime as needed for mild constipation.   Vitamin D (Ergocalciferol) 1.25 MG (50000 UNIT) Caps capsule Commonly known as: DRISDOL Take 50,000 Units by mouth every 7 (seven) days. Wednesdays        Follow-up Information     [name], NP. Schedule an appointment as soon as possible for a visit in 1 week(s).   Contact information: [place] [address] [phone]                Allergies  Allergen Reactions   Ciprofloxacin Hives   Levofloxacin Hives   Statins Other (See Comments)    Elevated liver enzymes  Tricor [Fenofibrate] Hives and Itching   Compazine [Prochlorperazine] Other (See Comments)    Tardive dyskinesia   Latex Other (See Comments)    Patient states "Messes with my skin."   Codeine Nausea And Vomiting   Metoprolol Other (See Comments)    Does not tolerate beta blockers 10/02/2013-pt states she can tolerate 25 mg and lower     Consultations: None   Procedures/Studies: MR BRAIN WO CONTRAST  Result Date: 04/10/2022 CLINICAL DATA:  Mental status change, unknown cause. EXAM: MRI HEAD WITHOUT CONTRAST TECHNIQUE: Multiplanar, multiecho pulse sequences of the brain and surrounding structures were obtained without intravenous contrast. COMPARISON:  MRI brain 03/06/2022. FINDINGS: Brain: Motion degraded study. No acute infarct or hemorrhage. No mass or midline shift. Few areas of T2 hyperintensity in the cerebral white matter unchanged and likely due to chronic small-vessel disease. No hydrocephalus or  extra-axial collection. Vascular: Normal flow voids. Skull and upper cervical spine: Normal marrow signal. Sinuses/Orbits: Mucous retention cyst in the left maxillary sinus. Other: None. IMPRESSION: No acute intracranial abnormality or mass. Electronically Signed   By: Emmit Alexanders M.D.   On: 04/10/2022 15:28   CT Angio Chest PE W and/or Wo Contrast  Result Date: 04/10/2022 CLINICAL DATA:  Pulmonary embolism (PE) suspected, low to intermediate prob, positive D-dimer; Abdominal pain, acute, nonlocalized. Mental status change. Dyspnea. History of lung cancer. * Tracking Code: BO * EXAM: CT ANGIOGRAPHY CHEST CT ABDOMEN AND PELVIS WITH CONTRAST TECHNIQUE: Multidetector CT imaging of the chest was performed using the standard protocol during bolus administration of intravenous contrast. Multiplanar CT image reconstructions and MIPs were obtained to evaluate the vascular anatomy. Multidetector CT imaging of the abdomen and pelvis was performed using the standard protocol during bolus administration of intravenous contrast. RADIATION DOSE REDUCTION: This exam was performed according to the departmental dose-optimization program which includes automated exposure control, adjustment of the mA and/or kV according to patient size and/or use of iterative reconstruction technique. CONTRAST:  14mL OMNIPAQUE IOHEXOL 350 MG/ML SOLN COMPARISON:  03/20/2022 chest CT. 02/12/2022 PET-CT. 10/04/2018 CT abdomen/pelvis. FINDINGS: CTA CHEST FINDINGS Cardiovascular: The study is moderate quality for the evaluation of pulmonary embolism, limited by significant motion degradation. There are no filling defects in the central, lobar, segmental or subsegmental pulmonary artery branches to suggest acute pulmonary embolism. Great vessels are normal in course and caliber. Normal heart size. No significant pericardial fluid/thickening. Left anterior descending and right coronary atherosclerosis/stents. Right internal jugular Port-A-Cath  terminates at the cavoatrial junction. Mediastinum/Nodes: No significant thyroid nodules. Unremarkable esophagus. No pathologically enlarged axillary, mediastinal or hilar lymph nodes. Lungs/Pleura: No pneumothorax. No pleural effusion. Severe centrilobular emphysema. Sub solid 2.3 x 1.7 cm peripheral right upper lobe pulmonary nodule (series 11/image 54), stable from recent 03/20/2022 chest CT using similar measurement technique. Sub solid 1.4 x 1.1 cm superior segment left lower lobe nodule (series 11/image 44), stable. No acute consolidative airspace disease or new significant pulmonary nodules. Musculoskeletal: No aggressive appearing focal osseous lesions. Mild thoracic spondylosis. Chronic mild T5 vertebral compression fracture. Review of the MIP images confirms the above findings. CT ABDOMEN and PELVIS FINDINGS Hepatobiliary: Normal liver with no liver mass. Cholecystectomy. No biliary ductal dilatation. Pancreas: Normal, with no mass or duct dilation. Spleen: Normal size. No mass. Adrenals/Urinary Tract: Normal adrenals. Normal kidneys with no hydronephrosis and no renal mass. Normal bladder. Stomach/Bowel: Normal non-distended stomach. Normal caliber small bowel with no small bowel wall thickening. Normal appendix. Scattered mild colonic diverticulosis with no large bowel wall thickening or significant  pericolonic fat stranding. Vascular/Lymphatic: Atherosclerotic nonaneurysmal abdominal aorta. Patent portal, splenic, hepatic and renal veins. No pathologically enlarged lymph nodes in the abdomen or pelvis. Reproductive: Status post hysterectomy, with no abnormal findings at the vaginal cuff. Simple 3.0 cm left adnexal cyst (series 5/image 75), new from 02/12/2022 PET-CT. No right adnexal mass. Other: No pneumoperitoneum, ascites or focal fluid collection. Musculoskeletal: No aggressive appearing focal osseous lesions. Marked degenerative disc disease at L5-S1. Review of the MIP images confirms the above  findings. IMPRESSION: 1. Limited motion degraded scan.  No evidence of pulmonary embolism. 2. Subsolid 2.3 cm peripheral right upper lobe and 1.4 cm superior segment left lower lobe pulmonary nodules, unchanged from recent 03/20/2022 chest CT. Multifocal pulmonary malignancy not excluded. No thoracic adenopathy. No evidence of metastatic disease in the abdomen or pelvis. 3. No acute pulmonary disease. No acute abnormality in the abdomen or pelvis. No evidence of bowel obstruction or acute bowel inflammation. Mild colonic diverticulosis. 4. Simple 3.0 cm left adnexal cyst, new from 02/12/2022 PET-CT. No follow-up imaging is recommended. Reference: JACR 2020 Feb;17(2):248-254 5. Aortic Atherosclerosis (ICD10-I70.0) and Emphysema (ICD10-J43.9). Electronically Signed   By: Ilona Sorrel M.D.   On: 04/10/2022 12:25   CT ABDOMEN PELVIS W CONTRAST  Result Date: 04/10/2022 CLINICAL DATA:  Pulmonary embolism (PE) suspected, low to intermediate prob, positive D-dimer; Abdominal pain, acute, nonlocalized. Mental status change. Dyspnea. History of lung cancer. * Tracking Code: BO * EXAM: CT ANGIOGRAPHY CHEST CT ABDOMEN AND PELVIS WITH CONTRAST TECHNIQUE: Multidetector CT imaging of the chest was performed using the standard protocol during bolus administration of intravenous contrast. Multiplanar CT image reconstructions and MIPs were obtained to evaluate the vascular anatomy. Multidetector CT imaging of the abdomen and pelvis was performed using the standard protocol during bolus administration of intravenous contrast. RADIATION DOSE REDUCTION: This exam was performed according to the departmental dose-optimization program which includes automated exposure control, adjustment of the mA and/or kV according to patient size and/or use of iterative reconstruction technique. CONTRAST:  136mL OMNIPAQUE IOHEXOL 350 MG/ML SOLN COMPARISON:  03/20/2022 chest CT. 02/12/2022 PET-CT. 10/04/2018 CT abdomen/pelvis. FINDINGS: CTA CHEST  FINDINGS Cardiovascular: The study is moderate quality for the evaluation of pulmonary embolism, limited by significant motion degradation. There are no filling defects in the central, lobar, segmental or subsegmental pulmonary artery branches to suggest acute pulmonary embolism. Great vessels are normal in course and caliber. Normal heart size. No significant pericardial fluid/thickening. Left anterior descending and right coronary atherosclerosis/stents. Right internal jugular Port-A-Cath terminates at the cavoatrial junction. Mediastinum/Nodes: No significant thyroid nodules. Unremarkable esophagus. No pathologically enlarged axillary, mediastinal or hilar lymph nodes. Lungs/Pleura: No pneumothorax. No pleural effusion. Severe centrilobular emphysema. Sub solid 2.3 x 1.7 cm peripheral right upper lobe pulmonary nodule (series 11/image 54), stable from recent 03/20/2022 chest CT using similar measurement technique. Sub solid 1.4 x 1.1 cm superior segment left lower lobe nodule (series 11/image 44), stable. No acute consolidative airspace disease or new significant pulmonary nodules. Musculoskeletal: No aggressive appearing focal osseous lesions. Mild thoracic spondylosis. Chronic mild T5 vertebral compression fracture. Review of the MIP images confirms the above findings. CT ABDOMEN and PELVIS FINDINGS Hepatobiliary: Normal liver with no liver mass. Cholecystectomy. No biliary ductal dilatation. Pancreas: Normal, with no mass or duct dilation. Spleen: Normal size. No mass. Adrenals/Urinary Tract: Normal adrenals. Normal kidneys with no hydronephrosis and no renal mass. Normal bladder. Stomach/Bowel: Normal non-distended stomach. Normal caliber small bowel with no small bowel wall thickening. Normal appendix. Scattered mild colonic diverticulosis with  no large bowel wall thickening or significant pericolonic fat stranding. Vascular/Lymphatic: Atherosclerotic nonaneurysmal abdominal aorta. Patent portal, splenic,  hepatic and renal veins. No pathologically enlarged lymph nodes in the abdomen or pelvis. Reproductive: Status post hysterectomy, with no abnormal findings at the vaginal cuff. Simple 3.0 cm left adnexal cyst (series 5/image 75), new from 02/12/2022 PET-CT. No right adnexal mass. Other: No pneumoperitoneum, ascites or focal fluid collection. Musculoskeletal: No aggressive appearing focal osseous lesions. Marked degenerative disc disease at L5-S1. Review of the MIP images confirms the above findings. IMPRESSION: 1. Limited motion degraded scan.  No evidence of pulmonary embolism. 2. Subsolid 2.3 cm peripheral right upper lobe and 1.4 cm superior segment left lower lobe pulmonary nodules, unchanged from recent 03/20/2022 chest CT. Multifocal pulmonary malignancy not excluded. No thoracic adenopathy. No evidence of metastatic disease in the abdomen or pelvis. 3. No acute pulmonary disease. No acute abnormality in the abdomen or pelvis. No evidence of bowel obstruction or acute bowel inflammation. Mild colonic diverticulosis. 4. Simple 3.0 cm left adnexal cyst, new from 02/12/2022 PET-CT. No follow-up imaging is recommended. Reference: JACR 2020 Feb;17(2):248-254 5. Aortic Atherosclerosis (ICD10-I70.0) and Emphysema (ICD10-J43.9). Electronically Signed   By: Ilona Sorrel M.D.   On: 04/10/2022 12:25   CT Head Wo Contrast  Result Date: 04/10/2022 CLINICAL DATA:  Mental status change, unknown cause. EXAM: CT HEAD WITHOUT CONTRAST TECHNIQUE: Contiguous axial images were obtained from the base of the skull through the vertex without intravenous contrast. RADIATION DOSE REDUCTION: This exam was performed according to the departmental dose-optimization program which includes automated exposure control, adjustment of the mA and/or kV according to patient size and/or use of iterative reconstruction technique. COMPARISON:  Head CT 10/11/2019. FINDINGS: Brain: No acute hemorrhage, mass effect or midline shift. Gray-white  differentiation is preserved. No hydrocephalus. No extra-axial collection. Basilar cisterns are patent. Vascular: No hyperdense vessel or unexpected calcification. Skull: No calvarial fracture or suspicious bone lesion. Skull base is unremarkable. Sinuses/Orbits: Unremarkable. Other: None. IMPRESSION: No acute intracranial abnormality. Electronically Signed   By: Emmit Alexanders M.D.   On: 04/10/2022 12:07   DG Chest Portable 1 View  Result Date: 04/10/2022 CLINICAL DATA:  Shortness of breath. EXAM: PORTABLE CHEST 1 VIEW COMPARISON:  03/15/2022 FINDINGS: Right jugular Port-A-Cath is stable with the tip near the superior cavoatrial junction. Heart size is within normal limits and stable. Densities near the left costophrenic angle appear to be chronic. No new airspace disease or pulmonary edema. Negative for a pneumothorax. IMPRESSION: No active disease. Electronically Signed   By: Markus Daft M.D.   On: 04/10/2022 10:05   CT CHEST WO CONTRAST  Result Date: 03/20/2022 CLINICAL DATA:  COPD exacerbation, persistent cough despite treatment EXAM: CT CHEST WITHOUT CONTRAST TECHNIQUE: Multidetector CT imaging of the chest was performed following the standard protocol without IV contrast. RADIATION DOSE REDUCTION: This exam was performed according to the departmental dose-optimization program which includes automated exposure control, adjustment of the mA and/or kV according to patient size and/or use of iterative reconstruction technique. COMPARISON:  01/15/2022 FINDINGS: Cardiovascular: Atherosclerotic calcifications aorta and coronary arteries. Heart size normal. No pericardial effusion. Aorta normal caliber. RIGHT jugular Port-A-Cath with tip in SVC. Mediastinum/Nodes: Esophagus normal appearance. Base of cervical region unremarkable. No thoracic adenopathy. Lungs/Pleura: Centrilobular emphysema greatest in upper lobes. Central peribronchial thickening. Subsolid opacity RIGHT upper lobe 17 x 10 mm image 48  unchanged. Additional small focus of subsolid opacity in the superior segment LEFT lower lobe abutting major fissure, 13 x 10 mm,  stable. Biapical scarring. Minimal peribronchovascular infiltrate RIGHT middle lobe and lingula, slightly increased. Calcified granuloma LEFT lower lobe image 115. Additional areas of minimal ground-glass opacity in th both upper lobes unchanged. RIGHT lower lobe nodule identified on previous exam no longer identified. 3 mm nodule posterior RIGHT upper lobe image 37 not definitely seen previously. No additional mass, nodule, infiltrate, or effusion. Upper Abdomen: Post cholecystectomy. Remaining visualized upper abdomen unremarkable Musculoskeletal: Osseous demineralization. Chronic compression fracture of T5 vertebral body with 40% anterior height loss again seen. IMPRESSION: Emphysematous and bronchitic changes with scattered chronic opacities in the upper lobes, stable. Minimal peribronchovascular infiltrate RIGHT middle lobe and lingula, slightly increased question related to bronchiolitis or atypical infection. Stable subsolid opacities in RIGHT upper lobe and superior segment LEFT lower lobe. 3 mm posterior RIGHT upper lobe nodule not definitely seen previously. Scattered atherosclerotic calcifications including coronary arteries. No acute intrathoracic abnormalities. Aortic Atherosclerosis (ICD10-I70.0) and Emphysema (ICD10-J43.9). Electronically Signed   By: Lavonia Dana M.D.   On: 03/20/2022 17:59   US Abdomen Limited  Result Date: 03/18/2022 CLINICAL DATA:  LFT elevation. EXAM: ULTRASOUND ABDOMEN LIMITED RIGHT UPPER QUADRANT COMPARISON:  05/11/2018. FINDINGS: Gallbladder: Surgically absent. Common bile duct: Diameter: 9 mm, within normal limits for post cholecystectomy status. Liver: No focal lesion identified. Increased parenchymal echogenicity. Portal vein is patent on color Doppler imaging with normal direction of blood flow towards the liver. Other: No free fluid.  IMPRESSION: 1. Hepatic steatosis. 2. Status post cholecystectomy. Electronically Signed   By: Brett Fairy M.D.   On: 03/18/2022 20:51   DG Chest 2 View  Result Date: 03/15/2022 CLINICAL DATA:  shob EXAM: CHEST - 2 VIEW COMPARISON:  October 04, 2018., February 12, 2022 FINDINGS: The cardiomediastinal silhouette is unchanged in contour.RIGHT chest port with tip terminating over the superior cavoatrial junction. No pleural effusion. No pneumothorax. No acute pleuroparenchymal abnormality. Known bilateral pulmonary nodularity is better assessed on recent PET-CT. Visualized abdomen is unremarkable. Similar minimal wedging of a upper thoracic vertebral body. IMPRESSION: 1. No acute cardiopulmonary abnormality. 2. Known bilateral pulmonary nodularity is better assessed on recent PET-CT. Electronically Signed   By: Valentino Saxon M.D.   On: 03/15/2022 19:14      Subjective: Patient seen and examined at bedside today.  Hemodynamically stable for discharge.  Denies any abdomen pain, nausea or vomiting.  Diarrhea has improved.  I called and discussed with the discharge planning with son on phone  Discharge Exam: Vitals:   04/12/22 2102 04/13/22 0530  BP: (!) 147/90 (!) 97/58  Pulse: 92 86  Resp: 20 19  Temp: 99.4 F (37.4 C) 98.5 F (36.9 C)  SpO2: 99% 97%   Vitals:   04/12/22 1320 04/12/22 1411 04/12/22 2102 04/13/22 0530  BP: 138/80  (!) 147/90 (!) 97/58  Pulse: (!) 103  92 86  Resp: 16  20 19   Temp: 98.4 F (36.9 C)  99.4 F (37.4 C) 98.5 F (36.9 C)  TempSrc: Oral  Oral Oral  SpO2: 100% 99% 99% 97%    General: Pt is alert, awake, not in acute distress Cardiovascular: RRR, S1/S2 +, no rubs, no gallops Respiratory: CTA bilaterally, no wheezing, no rhonchi Abdominal: Soft, NT, ND, bowel sounds + Extremities: no edema, no cyanosis    The results of significant diagnostics from this hospitalization (including imaging, microbiology, ancillary and laboratory) are listed below for  reference.     Microbiology: Recent Results (from the past 240 hour(s))  Resp panel by RT-PCR (RSV, Flu A&B, Covid)  Anterior Nasal Swab     Status: None   Collection Time: 04/10/22  1:36 PM   Specimen: Anterior Nasal Swab  Result Value Ref Range Status   SARS Coronavirus 2 by RT PCR NEGATIVE NEGATIVE Final    Comment: (NOTE) SARS-CoV-2 target nucleic acids are NOT DETECTED.  The SARS-CoV-2 RNA is generally detectable in upper respiratory specimens during the acute phase of infection. The lowest concentration of SARS-CoV-2 viral copies this assay can detect is 138 copies/mL. A negative result does not preclude SARS-Cov-2 infection and should not be used as the sole basis for treatment or other patient management decisions. A negative result may occur with  improper specimen collection/handling, submission of specimen other than nasopharyngeal swab, presence of viral mutation(s) within the areas targeted by this assay, and inadequate number of viral copies(<138 copies/mL). A negative result must be combined with clinical observations, patient history, and epidemiological information. The expected result is Negative.  Fact Sheet for Patients:  EntrepreneurPulse.com.au  Fact Sheet for Healthcare Providers:  IncredibleEmployment.be  This test is no t yet approved or cleared by the [place] FDA and  has been authorized for detection and/or diagnosis of SARS-CoV-2 by FDA under an Emergency Use Authorization (EUA). This EUA will remain  in effect (meaning this test can be used) for the duration of the COVID-19 declaration under Section 564(b)(1) of the Act, 21 U.S.C.section 360bbb-3(b)(1), unless the authorization is terminated  or revoked sooner.       Influenza A by PCR NEGATIVE NEGATIVE Final   Influenza B by PCR NEGATIVE NEGATIVE Final    Comment: (NOTE) The Xpert Xpress SARS-CoV-2/FLU/RSV plus assay is intended as an aid in the  diagnosis of influenza from Nasopharyngeal swab specimens and should not be used as a sole basis for treatment. Nasal washings and aspirates are unacceptable for Xpert Xpress SARS-CoV-2/FLU/RSV testing.  Fact Sheet for Patients: EntrepreneurPulse.com.au  Fact Sheet for Healthcare Providers: IncredibleEmployment.be  This test is not yet approved or cleared by the [place] FDA and has been authorized for detection and/or diagnosis of SARS-CoV-2 by FDA under an Emergency Use Authorization (EUA). This EUA will remain in effect (meaning this test can be used) for the duration of the COVID-19 declaration under Section 564(b)(1) of the Act, 21 U.S.C. section 360bbb-3(b)(1), unless the authorization is terminated or revoked.     Resp Syncytial Virus by PCR NEGATIVE NEGATIVE Final    Comment: (NOTE) Fact Sheet for Patients: EntrepreneurPulse.com.au  Fact Sheet for Healthcare Providers: IncredibleEmployment.be  This test is not yet approved or cleared by the [place] FDA and has been authorized for detection and/or diagnosis of SARS-CoV-2 by FDA under an Emergency Use Authorization (EUA). This EUA will remain in effect (meaning this test can be used) for the duration of the COVID-19 declaration under Section 564(b)(1) of the Act, 21 U.S.C. section 360bbb-3(b)(1), unless the authorization is terminated or revoked.  Performed at [facility], [place] [address], [place], [place] [address]   Gastrointestinal Panel by PCR , Stool     Status: None   Collection Time: 04/12/22 11:00 AM   Specimen: Stool  Result Value Ref Range Status   Campylobacter species NOT DETECTED NOT DETECTED Final   Plesimonas shigelloides NOT DETECTED NOT DETECTED Final   Salmonella species NOT DETECTED NOT DETECTED Final   Yersinia enterocolitica NOT DETECTED NOT DETECTED Final   Vibrio species NOT DETECTED NOT  DETECTED Final   Vibrio cholerae NOT DETECTED NOT DETECTED Final   Enteroaggregative E  coli (EAEC) NOT DETECTED NOT DETECTED Final   Enteropathogenic E coli (EPEC) NOT DETECTED NOT DETECTED Final   Enterotoxigenic E coli (ETEC) NOT DETECTED NOT DETECTED Final   Shiga like toxin producing E coli (STEC) NOT DETECTED NOT DETECTED Final   Shigella/Enteroinvasive E coli (EIEC) NOT DETECTED NOT DETECTED Final   Cryptosporidium NOT DETECTED NOT DETECTED Final   Cyclospora cayetanensis NOT DETECTED NOT DETECTED Final   Entamoeba histolytica NOT DETECTED NOT DETECTED Final   Giardia lamblia NOT DETECTED NOT DETECTED Final   Adenovirus F40/41 NOT DETECTED NOT DETECTED Final   Astrovirus NOT DETECTED NOT DETECTED Final   Norovirus GI/GII NOT DETECTED NOT DETECTED Final   Rotavirus A NOT DETECTED NOT DETECTED Final   Sapovirus (I, II, IV, and V) NOT DETECTED NOT DETECTED Final    Comment: Performed at [facility], [address] [place], [place] [address]     Labs: BNP (last 3 results) No results for input(s): "BNP" in the last 8760 hours. Basic Metabolic Panel: Recent Labs  Lab 04/10/22 1012 04/11/22 0526 04/12/22 0609 04/13/22 0500  NA 138 142 141 140  K 3.5 3.4* 3.5 3.9  CL 103 110 109 106  CO2 25 25 23 26   GLUCOSE 153* 124* 133* 102*  BUN 18 17 12 11   CREATININE 1.19* 1.11* 1.14* 1.30*  CALCIUM 8.6* 8.2* 8.1* 8.8*  MG  --  1.9  --   --   PHOS  --  3.2  --   --    Liver Function Tests: Recent Labs  Lab 04/10/22 1012 04/11/22 0526  AST 20 18  ALT 21 19  ALKPHOS 90 87  BILITOT 0.7 0.7  PROT 6.4* 5.8*  ALBUMIN 3.1* 3.0*   Recent Labs  Lab 04/10/22 1012  LIPASE 26   Recent Labs  Lab 04/10/22 1009  AMMONIA 14   CBC: Recent Labs  Lab 04/10/22 1012 04/11/22 0526 04/12/22 0609 04/13/22 0500  WBC 5.0 4.1 4.4 4.4  NEUTROABS 2.8  --   --   --   HGB 9.0* 8.1* 7.9* 8.1*  HCT 28.9* 26.2* 25.6* 26.6*  MCV 96.3 98.1 98.8 98.9  PLT 296 251 232 265    Cardiac Enzymes: No results for input(s): "CKTOTAL", "CKMB", "CKMBINDEX", "TROPONINI" in the last 168 hours. BNP: Invalid input(s): "POCBNP" CBG: No results for input(s): "GLUCAP" in the last 168 hours. D-Dimer No results for input(s): "DDIMER" in the last 72 hours. Hgb A1c Recent Labs    04/11/22 0526  HGBA1C 6.3*   Lipid Profile No results for input(s): "CHOL", "HDL", "LDLCALC", "TRIG", "CHOLHDL", "LDLDIRECT" in the last 72 hours. Thyroid function studies No results for input(s): "TSH", "T4TOTAL", "T3FREE", "THYROIDAB" in the last 72 hours.  Invalid input(s): "FREET3" Anemia work up Recent Labs    04/12/22 0958  TIBC 301  IRON 27*   Urinalysis    Component Value Date/Time   COLORURINE YELLOW 04/10/2022 1028   APPEARANCEUR CLEAR 04/10/2022 1028   LABSPEC 1.011 04/10/2022 1028   PHURINE 6.0 04/10/2022 1028   GLUCOSEU NEGATIVE 04/10/2022 1028   HGBUR SMALL (A) 04/10/2022 1028   BILIRUBINUR NEGATIVE 04/10/2022 1028   KETONESUR NEGATIVE 04/10/2022 1028   PROTEINUR NEGATIVE 04/10/2022 1028   UROBILINOGEN 0.2 02/06/2009 2322   NITRITE NEGATIVE 04/10/2022 1028   LEUKOCYTESUR NEGATIVE 04/10/2022 1028   Sepsis Labs Recent Labs  Lab 04/10/22 1012 04/11/22 0526 04/12/22 0609 04/13/22 0500  WBC 5.0 4.1 4.4 4.4   Microbiology Recent Results (from the past 240 hour(s))  Resp panel by RT-PCR (RSV, Flu A&B, Covid) Anterior Nasal Swab     Status: None   Collection Time: 04/10/22  1:36 PM   Specimen: Anterior Nasal Swab  Result Value Ref Range Status   SARS Coronavirus 2 by RT PCR NEGATIVE NEGATIVE Final    Comment: (NOTE) SARS-CoV-2 target nucleic acids are NOT DETECTED.  The SARS-CoV-2 RNA is generally detectable in upper respiratory specimens during the acute phase of infection. The lowest concentration of SARS-CoV-2 viral copies this assay can detect is 138 copies/mL. A negative result does not preclude SARS-Cov-2 infection and should not be used as the sole  basis for treatment or other patient management decisions. A negative result may occur with  improper specimen collection/handling, submission of specimen other than nasopharyngeal swab, presence of viral mutation(s) within the areas targeted by this assay, and inadequate number of viral copies(<138 copies/mL). A negative result must be combined with clinical observations, patient history, and epidemiological information. The expected result is Negative.  Fact Sheet for Patients:  EntrepreneurPulse.com.au  Fact Sheet for Healthcare Providers:  IncredibleEmployment.be  This test is no t yet approved or cleared by the [place] FDA and  has been authorized for detection and/or diagnosis of SARS-CoV-2 by FDA under an Emergency Use Authorization (EUA). This EUA will remain  in effect (meaning this test can be used) for the duration of the COVID-19 declaration under Section 564(b)(1) of the Act, 21 U.S.C.section 360bbb-3(b)(1), unless the authorization is terminated  or revoked sooner.       Influenza A by PCR NEGATIVE NEGATIVE Final   Influenza B by PCR NEGATIVE NEGATIVE Final    Comment: (NOTE) The Xpert Xpress SARS-CoV-2/FLU/RSV plus assay is intended as an aid in the diagnosis of influenza from Nasopharyngeal swab specimens and should not be used as a sole basis for treatment. Nasal washings and aspirates are unacceptable for Xpert Xpress SARS-CoV-2/FLU/RSV testing.  Fact Sheet for Patients: EntrepreneurPulse.com.au  Fact Sheet for Healthcare Providers: IncredibleEmployment.be  This test is not yet approved or cleared by the [place] FDA and has been authorized for detection and/or diagnosis of SARS-CoV-2 by FDA under an Emergency Use Authorization (EUA). This EUA will remain in effect (meaning this test can be used) for the duration of the COVID-19 declaration under Section 564(b)(1) of the Act,  21 U.S.C. section 360bbb-3(b)(1), unless the authorization is terminated or revoked.     Resp Syncytial Virus by PCR NEGATIVE NEGATIVE Final    Comment: (NOTE) Fact Sheet for Patients: EntrepreneurPulse.com.au  Fact Sheet for Healthcare Providers: IncredibleEmployment.be  This test is not yet approved or cleared by the [place] FDA and has been authorized for detection and/or diagnosis of SARS-CoV-2 by FDA under an Emergency Use Authorization (EUA). This EUA will remain in effect (meaning this test can be used) for the duration of the COVID-19 declaration under Section 564(b)(1) of the Act, 21 U.S.C. section 360bbb-3(b)(1), unless the authorization is terminated or revoked.  Performed at [facility], [place] [address], [place], [place] [address]   Gastrointestinal Panel by PCR , Stool     Status: None   Collection Time: 04/12/22 11:00 AM   Specimen: Stool  Result Value Ref Range Status   Campylobacter species NOT DETECTED NOT DETECTED Final   Plesimonas shigelloides NOT DETECTED NOT DETECTED Final   Salmonella species NOT DETECTED NOT DETECTED Final   Yersinia enterocolitica NOT DETECTED NOT DETECTED Final   Vibrio species NOT DETECTED NOT DETECTED Final   Vibrio cholerae NOT  DETECTED NOT DETECTED Final   Enteroaggregative E coli (EAEC) NOT DETECTED NOT DETECTED Final   Enteropathogenic E coli (EPEC) NOT DETECTED NOT DETECTED Final   Enterotoxigenic E coli (ETEC) NOT DETECTED NOT DETECTED Final   Shiga like toxin producing E coli (STEC) NOT DETECTED NOT DETECTED Final   Shigella/Enteroinvasive E coli (EIEC) NOT DETECTED NOT DETECTED Final   Cryptosporidium NOT DETECTED NOT DETECTED Final   Cyclospora cayetanensis NOT DETECTED NOT DETECTED Final   Entamoeba histolytica NOT DETECTED NOT DETECTED Final   Giardia lamblia NOT DETECTED NOT DETECTED Final   Adenovirus F40/41 NOT DETECTED NOT DETECTED Final   Astrovirus NOT  DETECTED NOT DETECTED Final   Norovirus GI/GII NOT DETECTED NOT DETECTED Final   Rotavirus A NOT DETECTED NOT DETECTED Final   Sapovirus (I, II, IV, and V) NOT DETECTED NOT DETECTED Final    Comment: Performed at [facility], [address] [place], [place] [address]    Please note: You were cared for by a hospitalist during your hospital stay. Once you are discharged, your primary care physician will handle any further medical issues. Please note that NO REFILLS for any discharge medications will be authorized once you are discharged, as it is imperative that you return to your primary care physician (or establish a relationship with a primary care physician if you do not have one) for your post hospital discharge needs so that they can reassess your need for medications and monitor your lab values.    Time coordinating discharge: 40 minutes  SIGNED:   Shelly Coss, MD  [facility] 04/13/2022, 11:13 AM Pager [phone]  If 7PM-7AM, please contact night-coverage [email or web address] Password [email or web address]

## 2022-04-13 NOTE — Plan of Care (Signed)
Patient is stable for discharge. Discharge instructions have been given. Patient is discharged to home with son.

## 2022-04-15 LAB — CULTURE, BLOOD (ROUTINE X 2)
Culture: NO GROWTH
Culture: NO GROWTH
Special Requests: ADEQUATE
Special Requests: ADEQUATE

## 2022-04-16 ENCOUNTER — Other Ambulatory Visit (HOSPITAL_COMMUNITY): Payer: Self-pay

## 2022-04-20 ENCOUNTER — Telehealth: Payer: Self-pay | Admitting: Internal Medicine

## 2022-04-20 ENCOUNTER — Other Ambulatory Visit (HOSPITAL_COMMUNITY): Payer: Self-pay

## 2022-04-20 ENCOUNTER — Telehealth: Payer: Self-pay | Admitting: *Deleted

## 2022-04-20 NOTE — Telephone Encounter (Signed)
Per 2/19 IB, pt has been called and confirmed

## 2022-04-20 NOTE — Telephone Encounter (Signed)
Patient called to request a follow up appt with Dr. Julien Nordmann, stating that she had missed recent appts due to being in hospital.  Provider notified - Schedule message sent.

## 2022-04-27 ENCOUNTER — Other Ambulatory Visit (HOSPITAL_COMMUNITY): Payer: Self-pay

## 2022-04-27 ENCOUNTER — Inpatient Hospital Stay: Payer: Managed Care, Other (non HMO) | Admitting: Internal Medicine

## 2022-04-27 ENCOUNTER — Inpatient Hospital Stay: Payer: Managed Care, Other (non HMO)

## 2022-05-04 ENCOUNTER — Other Ambulatory Visit: Payer: Self-pay | Admitting: Physician Assistant

## 2022-05-04 DIAGNOSIS — C3492 Malignant neoplasm of unspecified part of left bronchus or lung: Secondary | ICD-10-CM

## 2022-05-04 NOTE — Progress Notes (Deleted)
[name] OFFICE PROGRESS NOTE  [name], NP [place] [address]  DIAGNOSIS: Stage IV (T1a, N0, M1 a) non-small cell lung cancer, adenocarcinoma presented with multifocal disease in both lungs including 3 nodules in the left lung as well as 2 nodules in the right lung diagnosed in December 2019.      Molecular studies showed: KRAS G12C BCOR N1425S-subclonal RAD21 amplification   PDL 1 expression 1%.    PRIOR THERAPY: 1) Systemic chemotherapy with carboplatin for AUC of 5, Alimta 500 mg/M2 and Keytruda 200 mg IV every 3 weeks.  First dose April 12, 2018. Status post 4 cycles. starting from cycle #3, Keytruda was dropped and patient received treatment with Carboplatin for an AUC of 5 and Alimta 500 mg/m2 IV every 3 weeks.  2) Systemic chemotherapy with Alimta 500 mg/m2 IV every 3 weeks. Status post 3 cycles of single agent Alimta. Most recent dose given on 08/23/2018.  The treatment is currently on hold secondary to renal insufficiency  CURRENT THERAPY: Krazati 600 mg BID, first dose on 02/20/22. Dose reduced to 400 mg BID on 03/10/22.   INTERVAL HISTORY: [name] 67 y.o. female returns to the clinic today for follow-up visit.  She was last seen on 03/10/2022.  Unfortunately, the patient was found to have progressive disease in December 2023 after being off treatment for several years.  Due to her history of poor tolerance to chemotherapy in the past, was unlikely she was going to do well with systemic IV chemotherapy.  Therefore she was put on targeted treatment with [place].  The patient has been having a lot of ongoing symptoms and feeling unwell even prior to starting treatment.  We reduced her dose of Krazati on 03/10/22 to 400 mg twice daily.  Has she continue to take this?  In the interval since last being seen, the patient had a prolonged hospitalization from 1/14 to 1/30 after presenting with dyspnea on exertion was found to have  influenza.  She also was found to have AKI and CKD due to dehydration.  She was also found to have steroid-induced hyperglycemia while in the hospital and also her hospitalization was complicated by having a shingles outbreak.  She is currently on Valtrex.  Upon discharge, is recommended that she have home healt.  She then presented to the hospital again on 2/9 and was admitted until 2/12 after presenting for vague abdominal pain of unclear etiology.  He was thought it could be potentially secondary to his suspected urinary tract infection.     Since being discharged, follow-up with PCP?  She also had a CT angiogram on 2/9 which did not show ***.  While admitted to the hospital she also had a CT of the abdomen pelvis and a brain MRI without any evidence of cancer progression.  Since being discharged she still feels *** .  Fatigue?  Denies any fevers, night sweats, or unexplained weight loss.  For breath and cough?  Denies any chest pain or hemoptysis.  She has been struggling with nausea and vomiting ongoing?  On supplement total oxygen via nasal cannula with ***years.  Denies any rashes or skin changes.  Denies any headache or visual changes.  Diarrhea and constipation?  Continues to have abdominal pain?  She is here today for evaluation and for more detailed discussion of her current condition and recommended treatment and labs.   MEDICAL HISTORY: Past Medical History:  Diagnosis Date   Anemia    "  long time ago" (12/06/2012)   Anxiety    Arthritis    "right knee real bad; in my hands bad" (12/06/2012)   Asthma    Celiac disease    Colon polyps    COPD (chronic obstructive pulmonary disease) (HCC)    Coronary artery disease    a. s/p Xience DES to [facility] 04/2008;  b. LHC 05/2008: Proximal RCA 25%, mid RCA stent patent.  c. Anormal nuc 2014 -> s/p LHC with severe mRCA stenosis s/p DES. d. Cath 04/2013 s/p DES to LAD.  e. LHC 08/2015 was stable.   Depression    "years ago" (12/06/2012)   Fibromyalgia     Gallstones    GERD (gastroesophageal reflux disease)    H/O hiatal hernia    Hepatitis    "not A, B, or C" (12/06/2012)   Hyperlipidemia    intol to statins and other chol agents due to elevated LFTs   Hypertension    Hypothyroidism    IBS (irritable bowel syndrome)    Interstitial cystitis    Lung cancer ([place]) dx'd 01/2018   Lung nodules    Bilateral   Migraines    "when I was younger" (12/06/2012)   Pneumonia    PONV (postoperative nausea and vomiting)    "Very bad"   Premature atrial contractions    PVC's (premature ventricular contractions)    Sinus headache    SVT (supraventricular tachycardia)     ALLERGIES:  is allergic to ciprofloxacin, levofloxacin, statins, tricor [fenofibrate], compazine [prochlorperazine], latex, codeine, and metoprolol.  MEDICATIONS:  Current Outpatient Medications  Medication Sig Dispense Refill   adagrasib (KRAZATI) 200 MG tablet Take 2 tablets (400 mg total) by mouth 2 (two) times daily. 180 tablet 3   albuterol (PROVENTIL) (5 MG/ML) 0.5% nebulizer solution Take 2.5 mg by nebulization every 6 (six) hours as needed for wheezing.     albuterol (VENTOLIN HFA) 108 (90 Base) MCG/ACT inhaler Inhale 1 puff into the lungs every 6 (six) hours as needed (wheezing/shortness of breath.).      aspirin 81 MG chewable tablet Chew 1 tablet (81 mg total) by mouth daily.     benzonatate (TESSALON) 200 MG capsule Take 1 capsule (200 mg total) by mouth 3 (three) times daily as needed for cough. 20 capsule 0   Budeson-Glycopyrrol-Formoterol (BREZTRI AEROSPHERE) 160-9-4.8 MCG/ACT AERO Inhale 2 puffs into the lungs in the morning and at bedtime. 10.7 g 5   buPROPion (WELLBUTRIN XL) 150 MG 24 hr tablet Take 1-2 tablets by mouth 2 (two) times daily. Patient takes 2 tablets ('300mg'$ ) in the morning and 1 tablet ('150mg'$ ) in the evening     cyanocobalamin 1000 MCG tablet Take 1 tablet (1,000 mcg total) by mouth daily. 100 tablet 0   dicyclomine (BENTYL) 10 MG capsule Take 1  capsule (10 mg total) by mouth every 8 (eight) hours as needed for spasms. 20 capsule 0   ferrous sulfate 325 (65 FE) MG EC tablet Take 1 tablet (325 mg total) by mouth daily with breakfast. 30 tablet 1   gabapentin (NEURONTIN) 100 MG capsule Take 1 capsule (100 mg total) by mouth 3 (three) times daily for 15 days. 45 capsule 0   HYDROcodone bit-homatropine (HYCODAN) 5-1.5 MG/5ML syrup Take 5 mLs by mouth every 6 (six) hours as needed for cough. 120 mL 0   HYDROcodone-acetaminophen (NORCO/VICODIN) 5-325 MG tablet Take 1 tablet by mouth 4 (four) times daily as needed (pain).     ipratropium-albuterol (DUONEB) 0.5-2.5 (3) MG/3ML SOLN Inhale  3 mLs into the lungs every 6 (six) hours as needed (Asthma). 360 mL 3   loperamide (IMODIUM A-D) 2 MG tablet Take 1 tablet (2 mg total) by mouth 4 (four) times daily as needed for diarrhea or loose stools. 20 tablet 0   LORazepam (ATIVAN) 2 MG tablet Take 2 mg by mouth every evening.     metFORMIN (GLUCOPHAGE-XR) 500 MG 24 hr tablet Take 1 tablet (500 mg total) by mouth daily with breakfast. 30 tablet 2   nitroGLYCERIN (NITROSTAT) 0.4 MG SL tablet Place 1 tablet (0.4 mg total) under the tongue every 5 (five) minutes as needed for chest pain. 25 tablet 12   ondansetron (ZOFRAN-ODT) 4 MG disintegrating tablet Take 1 tablet (4 mg total) by mouth every 8 (eight) hours as needed for nausea or vomiting. 30 tablet 2   pantoprazole (PROTONIX) 40 MG tablet Take 1 tablet (40 mg total) by mouth daily. 30 tablet 0   Vitamin D, Ergocalciferol, (DRISDOL) 1.25 MG (50000 UT) CAPS capsule Take 50,000 Units by mouth every 7 (seven) days. Wednesdays     No current facility-administered medications for this visit.    SURGICAL HISTORY:  Past Surgical History:  Procedure Laterality Date   CARDIAC CATHETERIZATION  04/2008; 05/2008   CARDIAC CATHETERIZATION N/A 08/07/2015   Procedure: Left Heart Cath and Coronary Angiography;  Surgeon: Sherren Mocha, MD;  Location: [place] [facility];   Service: Cardiovascular;  Laterality: N/A;   CHOLECYSTECTOMY     CORONARY ANGIOPLASTY WITH STENT PLACEMENT  04/2008 12/06/2012   "1 + 1" (12/06/2012)   CORONARY STENT PLACEMENT  05/08/2013   DES TO LAD      DR MCALHANY   IR IMAGING GUIDED PORT INSERTION  04/29/2018   LEFT HEART CATHETERIZATION WITH CORONARY ANGIOGRAM N/A 12/06/2012   Procedure: LEFT HEART CATHETERIZATION WITH CORONARY ANGIOGRAM;  Surgeon: [name], MD;  Location: [facility] CATH LAB;  Service: Cardiovascular;  Laterality: N/A;   LEFT HEART CATHETERIZATION WITH CORONARY ANGIOGRAM N/A 05/08/2013   Procedure: LEFT HEART CATHETERIZATION WITH CORONARY ANGIOGRAM;  Surgeon: [name], MD;  Location: [facility] CATH LAB;  Service: Cardiovascular;  Laterality: N/A;   LEFT HEART CATHETERIZATION WITH CORONARY ANGIOGRAM N/A 01/04/2014   Procedure: LEFT HEART CATHETERIZATION WITH CORONARY ANGIOGRAM;  Surgeon: [name], MD;  Location: [facility] CATH LAB;  Service: Cardiovascular;  Laterality: N/A;   PERCUTANEOUS CORONARY STENT INTERVENTION (PCI-S)  12/06/2012   Procedure: PERCUTANEOUS CORONARY STENT INTERVENTION (PCI-S);  Surgeon: [name], MD;  Location: [facility] CATH LAB;  Service: Cardiovascular;;   PERCUTANEOUS CORONARY STENT INTERVENTION (PCI-S)  05/08/2013   Procedure: PERCUTANEOUS CORONARY STENT INTERVENTION (PCI-S);  Surgeon: [name], MD;  Location: [facility] CATH LAB;  Service: Cardiovascular;;  mid LAD    TUBAL LIGATION     VAGINAL HYSTERECTOMY     VIDEO BRONCHOSCOPY WITH ENDOBRONCHIAL NAVIGATION N/A 02/17/2018   Procedure: VIDEO BRONCHOSCOPY WITH ENDOBRONCHIAL NAVIGATION;  Surgeon: Melrose Nakayama, MD;  Location: [facility];  Service: Thoracic;  Laterality: N/A;    REVIEW OF SYSTEMS:   Review of Systems  Constitutional: Negative for appetite change, chills, fatigue, fever and unexpected weight change.  HENT:   Negative for mouth sores, nosebleeds, sore throat and trouble swallowing.   Eyes: Negative  for eye problems and icterus.  Respiratory: Negative for cough, hemoptysis, shortness of breath and wheezing.   Cardiovascular: Negative for chest pain and leg swelling.  Gastrointestinal: Negative for abdominal pain, constipation, diarrhea, nausea and vomiting.  Genitourinary: Negative for bladder  incontinence, difficulty urinating, dysuria, frequency and hematuria.   Musculoskeletal: Negative for back pain, gait problem, neck pain and neck stiffness.  Skin: Negative for itching and rash.  Neurological: Negative for dizziness, extremity weakness, gait problem, headaches, light-headedness and seizures.  Hematological: Negative for adenopathy. Does not bruise/bleed easily.  Psychiatric/Behavioral: Negative for confusion, depression and sleep disturbance. The patient is not nervous/anxious.     PHYSICAL EXAMINATION:  There were no vitals taken for this visit.  ECOG PERFORMANCE STATUS: {CHL ONC ECOG [id number]  Physical Exam  Constitutional: Oriented to person, place, and time and well-developed, well-nourished, and in no distress. No distress.  HENT:  Head: Normocephalic and atraumatic.  Mouth/Throat: Oropharynx is clear and moist. No oropharyngeal exudate.  Eyes: Conjunctivae are normal. Right eye exhibits no discharge. Left eye exhibits no discharge. No scleral icterus.  Neck: Normal range of motion. Neck supple.  Cardiovascular: Normal rate, regular rhythm, normal heart sounds and intact distal pulses.   Pulmonary/Chest: Effort normal and breath sounds normal. No respiratory distress. No wheezes. No rales.  Abdominal: Soft. Bowel sounds are normal. Exhibits no distension and no mass. There is no tenderness.  Musculoskeletal: Normal range of motion. Exhibits no edema.  Lymphadenopathy:    No cervical adenopathy.  Neurological: Alert and oriented to person, place, and time. Exhibits normal muscle tone. Gait normal. Coordination normal.  Skin: Skin is warm and dry. No rash noted. Not  diaphoretic. No erythema. No pallor.  Psychiatric: Mood, memory and judgment normal.  Vitals reviewed.  LABORATORY DATA: Lab Results  Component Value Date   WBC 4.4 04/13/2022   HGB 8.1 (L) 04/13/2022   HCT 26.6 (L) 04/13/2022   MCV 98.9 04/13/2022   PLT 265 04/13/2022      Chemistry      Component Value Date/Time   NA 140 04/13/2022 0500   NA 143 01/10/2018 1629   K 3.9 04/13/2022 0500   CL 106 04/13/2022 0500   CO2 26 04/13/2022 0500   BUN 11 04/13/2022 0500   BUN 14 01/10/2018 1629   CREATININE 1.30 (H) 04/13/2022 0500   CREATININE 2.03 (H) 03/10/2022 1458   CREATININE 0.68 08/05/2015 1456      Component Value Date/Time   CALCIUM 8.8 (L) 04/13/2022 0500   ALKPHOS 87 04/11/2022 0526   AST 18 04/11/2022 0526   AST 13 (L) 03/10/2022 1458   ALT 19 04/11/2022 0526   ALT 39 03/10/2022 1458   BILITOT 0.7 04/11/2022 0526   BILITOT 0.6 03/10/2022 1458       RADIOGRAPHIC STUDIES:  MR BRAIN WO CONTRAST  Result Date: 04/10/2022 CLINICAL DATA:  Mental status change, unknown cause. EXAM: MRI HEAD WITHOUT CONTRAST TECHNIQUE: Multiplanar, multiecho pulse sequences of the brain and surrounding structures were obtained without intravenous contrast. COMPARISON:  MRI brain 03/06/2022. FINDINGS: Brain: Motion degraded study. No acute infarct or hemorrhage. No mass or midline shift. Few areas of T2 hyperintensity in the cerebral white matter unchanged and likely due to chronic small-vessel disease. No hydrocephalus or extra-axial collection. Vascular: Normal flow voids. Skull and upper cervical spine: Normal marrow signal. Sinuses/Orbits: Mucous retention cyst in the left maxillary sinus. Other: None. IMPRESSION: No acute intracranial abnormality or mass. Electronically Signed   By: Emmit Alexanders M.D.   On: 04/10/2022 15:28   CT Angio Chest PE W and/or Wo Contrast  Result Date: 04/10/2022 CLINICAL DATA:  Pulmonary embolism (PE) suspected, low to intermediate prob, positive D-dimer;  Abdominal pain, acute, nonlocalized. Mental status change. Dyspnea. History of lung  cancer. * Tracking Code: BO * EXAM: CT ANGIOGRAPHY CHEST CT ABDOMEN AND PELVIS WITH CONTRAST TECHNIQUE: Multidetector CT imaging of the chest was performed using the standard protocol during bolus administration of intravenous contrast. Multiplanar CT image reconstructions and MIPs were obtained to evaluate the vascular anatomy. Multidetector CT imaging of the abdomen and pelvis was performed using the standard protocol during bolus administration of intravenous contrast. RADIATION DOSE REDUCTION: This exam was performed according to the departmental dose-optimization program which includes automated exposure control, adjustment of the mA and/or kV according to patient size and/or use of iterative reconstruction technique. CONTRAST:  127m OMNIPAQUE IOHEXOL 350 MG/ML SOLN COMPARISON:  03/20/2022 chest CT. 02/12/2022 PET-CT. 10/04/2018 CT abdomen/pelvis. FINDINGS: CTA CHEST FINDINGS Cardiovascular: The study is moderate quality for the evaluation of pulmonary embolism, limited by significant motion degradation. There are no filling defects in the central, lobar, segmental or subsegmental pulmonary artery branches to suggest acute pulmonary embolism. Great vessels are normal in course and caliber. Normal heart size. No significant pericardial fluid/thickening. Left anterior descending and right coronary atherosclerosis/stents. Right internal jugular Port-A-Cath terminates at the cavoatrial junction. Mediastinum/Nodes: No significant thyroid nodules. Unremarkable esophagus. No pathologically enlarged axillary, mediastinal or hilar lymph nodes. Lungs/Pleura: No pneumothorax. No pleural effusion. Severe centrilobular emphysema. Sub solid 2.3 x 1.7 cm peripheral right upper lobe pulmonary nodule (series 11/image 54), stable from recent 03/20/2022 chest CT using similar measurement technique. Sub solid 1.4 x 1.1 cm superior segment left  lower lobe nodule (series 11/image 44), stable. No acute consolidative airspace disease or new significant pulmonary nodules. Musculoskeletal: No aggressive appearing focal osseous lesions. Mild thoracic spondylosis. Chronic mild T5 vertebral compression fracture. Review of the MIP images confirms the above findings. CT ABDOMEN and PELVIS FINDINGS Hepatobiliary: Normal liver with no liver mass. Cholecystectomy. No biliary ductal dilatation. Pancreas: Normal, with no mass or duct dilation. Spleen: Normal size. No mass. Adrenals/Urinary Tract: Normal adrenals. Normal kidneys with no hydronephrosis and no renal mass. Normal bladder. Stomach/Bowel: Normal non-distended stomach. Normal caliber small bowel with no small bowel wall thickening. Normal appendix. Scattered mild colonic diverticulosis with no large bowel wall thickening or significant pericolonic fat stranding. Vascular/Lymphatic: Atherosclerotic nonaneurysmal abdominal aorta. Patent portal, splenic, hepatic and renal veins. No pathologically enlarged lymph nodes in the abdomen or pelvis. Reproductive: Status post hysterectomy, with no abnormal findings at the vaginal cuff. Simple 3.0 cm left adnexal cyst (series 5/image 75), new from 02/12/2022 PET-CT. No right adnexal mass. Other: No pneumoperitoneum, ascites or focal fluid collection. Musculoskeletal: No aggressive appearing focal osseous lesions. Marked degenerative disc disease at L5-S1. Review of the MIP images confirms the above findings. IMPRESSION: 1. Limited motion degraded scan.  No evidence of pulmonary embolism. 2. Subsolid 2.3 cm peripheral right upper lobe and 1.4 cm superior segment left lower lobe pulmonary nodules, unchanged from recent 03/20/2022 chest CT. Multifocal pulmonary malignancy not excluded. No thoracic adenopathy. No evidence of metastatic disease in the abdomen or pelvis. 3. No acute pulmonary disease. No acute abnormality in the abdomen or pelvis. No evidence of bowel  obstruction or acute bowel inflammation. Mild colonic diverticulosis. 4. Simple 3.0 cm left adnexal cyst, new from 02/12/2022 PET-CT. No follow-up imaging is recommended. Reference: JACR 2020 Feb;17(2):248-254 5. Aortic Atherosclerosis (ICD10-I70.0) and Emphysema (ICD10-J43.9). Electronically Signed   By: JIlona SorrelM.D.   On: 04/10/2022 12:25   CT ABDOMEN PELVIS W CONTRAST  Result Date: 04/10/2022 CLINICAL DATA:  Pulmonary embolism (PE) suspected, low to intermediate prob, positive D-dimer; Abdominal pain, acute, nonlocalized.  Mental status change. Dyspnea. History of lung cancer. * Tracking Code: BO * EXAM: CT ANGIOGRAPHY CHEST CT ABDOMEN AND PELVIS WITH CONTRAST TECHNIQUE: Multidetector CT imaging of the chest was performed using the standard protocol during bolus administration of intravenous contrast. Multiplanar CT image reconstructions and MIPs were obtained to evaluate the vascular anatomy. Multidetector CT imaging of the abdomen and pelvis was performed using the standard protocol during bolus administration of intravenous contrast. RADIATION DOSE REDUCTION: This exam was performed according to the departmental dose-optimization program which includes automated exposure control, adjustment of the mA and/or kV according to patient size and/or use of iterative reconstruction technique. CONTRAST:  172m OMNIPAQUE IOHEXOL 350 MG/ML SOLN COMPARISON:  03/20/2022 chest CT. 02/12/2022 PET-CT. 10/04/2018 CT abdomen/pelvis. FINDINGS: CTA CHEST FINDINGS Cardiovascular: The study is moderate quality for the evaluation of pulmonary embolism, limited by significant motion degradation. There are no filling defects in the central, lobar, segmental or subsegmental pulmonary artery branches to suggest acute pulmonary embolism. Great vessels are normal in course and caliber. Normal heart size. No significant pericardial fluid/thickening. Left anterior descending and right coronary atherosclerosis/stents. Right internal  jugular Port-A-Cath terminates at the cavoatrial junction. Mediastinum/Nodes: No significant thyroid nodules. Unremarkable esophagus. No pathologically enlarged axillary, mediastinal or hilar lymph nodes. Lungs/Pleura: No pneumothorax. No pleural effusion. Severe centrilobular emphysema. Sub solid 2.3 x 1.7 cm peripheral right upper lobe pulmonary nodule (series 11/image 54), stable from recent 03/20/2022 chest CT using similar measurement technique. Sub solid 1.4 x 1.1 cm superior segment left lower lobe nodule (series 11/image 44), stable. No acute consolidative airspace disease or new significant pulmonary nodules. Musculoskeletal: No aggressive appearing focal osseous lesions. Mild thoracic spondylosis. Chronic mild T5 vertebral compression fracture. Review of the MIP images confirms the above findings. CT ABDOMEN and PELVIS FINDINGS Hepatobiliary: Normal liver with no liver mass. Cholecystectomy. No biliary ductal dilatation. Pancreas: Normal, with no mass or duct dilation. Spleen: Normal size. No mass. Adrenals/Urinary Tract: Normal adrenals. Normal kidneys with no hydronephrosis and no renal mass. Normal bladder. Stomach/Bowel: Normal non-distended stomach. Normal caliber small bowel with no small bowel wall thickening. Normal appendix. Scattered mild colonic diverticulosis with no large bowel wall thickening or significant pericolonic fat stranding. Vascular/Lymphatic: Atherosclerotic nonaneurysmal abdominal aorta. Patent portal, splenic, hepatic and renal veins. No pathologically enlarged lymph nodes in the abdomen or pelvis. Reproductive: Status post hysterectomy, with no abnormal findings at the vaginal cuff. Simple 3.0 cm left adnexal cyst (series 5/image 75), new from 02/12/2022 PET-CT. No right adnexal mass. Other: No pneumoperitoneum, ascites or focal fluid collection. Musculoskeletal: No aggressive appearing focal osseous lesions. Marked degenerative disc disease at L5-S1. Review of the MIP images  confirms the above findings. IMPRESSION: 1. Limited motion degraded scan.  No evidence of pulmonary embolism. 2. Subsolid 2.3 cm peripheral right upper lobe and 1.4 cm superior segment left lower lobe pulmonary nodules, unchanged from recent 03/20/2022 chest CT. Multifocal pulmonary malignancy not excluded. No thoracic adenopathy. No evidence of metastatic disease in the abdomen or pelvis. 3. No acute pulmonary disease. No acute abnormality in the abdomen or pelvis. No evidence of bowel obstruction or acute bowel inflammation. Mild colonic diverticulosis. 4. Simple 3.0 cm left adnexal cyst, new from 02/12/2022 PET-CT. No follow-up imaging is recommended. Reference: JACR 2020 Feb;17(2):248-254 5. Aortic Atherosclerosis (ICD10-I70.0) and Emphysema (ICD10-J43.9). Electronically Signed   By: JIlona SorrelM.D.   On: 04/10/2022 12:25   CT Head Wo Contrast  Result Date: 04/10/2022 CLINICAL DATA:  Mental status change, unknown cause. EXAM: CT HEAD  WITHOUT CONTRAST TECHNIQUE: Contiguous axial images were obtained from the base of the skull through the vertex without intravenous contrast. RADIATION DOSE REDUCTION: This exam was performed according to the departmental dose-optimization program which includes automated exposure control, adjustment of the mA and/or kV according to patient size and/or use of iterative reconstruction technique. COMPARISON:  Head CT 10/11/2019. FINDINGS: Brain: No acute hemorrhage, mass effect or midline shift. Gray-white differentiation is preserved. No hydrocephalus. No extra-axial collection. Basilar cisterns are patent. Vascular: No hyperdense vessel or unexpected calcification. Skull: No calvarial fracture or suspicious bone lesion. Skull base is unremarkable. Sinuses/Orbits: Unremarkable. Other: None. IMPRESSION: No acute intracranial abnormality. Electronically Signed   By: Emmit Alexanders M.D.   On: 04/10/2022 12:07   DG Chest Portable 1 View  Result Date: 04/10/2022 CLINICAL DATA:   Shortness of breath. EXAM: PORTABLE CHEST 1 VIEW COMPARISON:  03/15/2022 FINDINGS: Right jugular Port-A-Cath is stable with the tip near the superior cavoatrial junction. Heart size is within normal limits and stable. Densities near the left costophrenic angle appear to be chronic. No new airspace disease or pulmonary edema. Negative for a pneumothorax. IMPRESSION: No active disease. Electronically Signed   By: Markus Daft M.D.   On: 04/10/2022 10:05     ASSESSMENT/PLAN:  This is a very pleasant 67 year old Caucasian female diagnosed with stage IV non-small cell lung cancer, adenocarcinoma. She is negative for any actionable mutations and her PD-L1 expression is 1%. She was diagnosed in December 2019. She presented with bilateral pulmonary nodules.    She initially started treatment with carboplatin, Alimta, and Keytruda. She was status post 4 cycles but Keytruda was discontinued after cycle #2 due to his significant skin rash. She then was treated with maintenance single agent Alimta for 3 cycles. This has been on hold since June 2020 due to worsening renal insufficiency.   She has been on observation since that time and feeling fairly well except she does have significant COPD for which she is on supplemental oxygen. She follows closely with pulmonology.   In November 2023 the patient had a repeat CT scan which showed new area of nodularity with potential endobronchial extension and lymph node adjacent to the endobronchial material in the right hilu that is suspicious and a new solid nodule in the medial right lower lobe concerning for additional site of disease.   She had a PET scan which showed evidence of disease progression with multiple bilateral solid and subsolid nodules.  Due to the patient's history of intolerance to chemotherapy she was started on second line targeted treatment with Krazati 600 mg twice daily.  Her first dose was on 02/20/22.  Dose was reduced to 400 mg twice daily around  03/03/2022 due to ongoing symptoms of nausea, vomiting, and diarrhea.   The patient was seen with Dr. Julien Nordmann. Dr. Julien Nordmann reviewed her recent CT scans from the hospital. Dr. Julien Nordmann recommends ***.   Pulm?   Creatinine? BP? Fluids.   Nausea/vomiting.   Dose***  The patient was advised to call immediately if she has any concerning symptoms in the interval. The patient voices understanding of current disease status and treatment options and is in agreement with the current care plan. All questions were answered. The patient knows to call the clinic with any problems, questions or concerns. We can certainly see the patient much sooner if necessary          No orders of the defined types were placed in this encounter.    I spent {CHL  ONC TIME VISIT - [id number] counseling the patient face to face. The total time spent in the appointment was {CHL ONC TIME VISIT - [id number].  Haylin Camilli L Marli Diego, PA-C 05/04/22

## 2022-05-05 ENCOUNTER — Telehealth: Payer: Self-pay | Admitting: *Deleted

## 2022-05-05 ENCOUNTER — Inpatient Hospital Stay: Payer: Managed Care, Other (non HMO) | Admitting: Physician Assistant

## 2022-05-05 ENCOUNTER — Inpatient Hospital Stay: Payer: Managed Care, Other (non HMO)

## 2022-05-05 NOTE — Telephone Encounter (Signed)
Patient called to reschedule todays appointment. Patient aware of date and time of appointment.

## 2022-05-06 NOTE — Progress Notes (Signed)
[facility] OFFICE PROGRESS NOTE  [name], NP [place] [address]  DIAGNOSIS: Stage IV (T1a, N0, M1 a) non-small cell lung cancer, adenocarcinoma presented with multifocal disease in both lungs including 3 nodules in the left lung as well as 2 nodules in the right lung diagnosed in December 2019.      Molecular studies showed: KRAS G12C BCOR N1425S-subclonal RAD21 amplification   PDL 1 expression 1%.  PRIOR THERAPY: 1) Systemic chemotherapy with carboplatin for AUC of 5, Alimta 500 mg/M2 and Keytruda 200 mg IV every 3 weeks.  First dose April 12, 2018. Status post 4 cycles. starting from cycle #3, Keytruda was dropped and patient received treatment with Carboplatin for an AUC of 5 and Alimta 500 mg/m2 IV every 3 weeks.  2) Systemic chemotherapy with Alimta 500 mg/m2 IV every 3 weeks. Status post 3 cycles of single agent Alimta. Most recent dose given on 08/23/2018.  The treatment is currently on hold secondary to renal insufficiency    CURRENT THERAPY: Krazati 600 mg BID, first dose on 02/20/22. Dose reduced to 400 mg BID starting on 03/10/22. However, the patient states this was on hold since she was in the hospital on 03/15/22 and she has not resumed it.   INTERVAL HISTORY: [name] 67 y.o. female returns to the clinic today for a follow-up appointment.  The patient was last seen in the clinic on 03/10/2022.  In summary, the patient had been off treatment for several years.  She had prior intolerance to chemotherapy in 2020.  She recently was found to have some new nodularity in the bronchovascular structures representing endobronchial extension with a few small lymph nodes in the right hilum and a new nodule in the medial right lower lobe concerning for additional sites of disease.  Her PET scan confirmed disease progression.  The patient started targeted treatment with Alfred Levins due to prior intolerance to IV chemotherapy.  The patient had  been having some ongoing concerns even prior to [place] with ongoing fatigue, weakness, abdominal discomfort, tachycardia, nausea, and vomiting.  The patient was seen on 03/10/2021 who recommended reducing the dose of Krazati to 400 mg twice daily.  She reports for the few days she was taking this, she had tolerated this better. However, when she was admitted to the hospital, she states she was not taking this medication since 03/15/22. She still has not resumed at this time because she was not sure if she was supposed to be taking this.   The patient is on supplemental oxygen with 3 liters at baseline due to significant COPD. She follows with Dr. Lamonte Sakai from pulmonary medicine. She does not have any upcoming appointments. In the interval since last being seen the patient was hospitalized from 1/14 -1/30 due to dyspnea on exertion, AKI upon CKD for dehydration.  She also her hospitalization was complicated by steroid-induced hyperglycemia and shingles for which she was on Valtrex.  She completed Valtrex and the shingles is gone but she still continues to have postherpetic neuralgia.  She was prescribed gabapentin while in the hospital needs a refill of this.  They recommended home health on discharge. She states they could not find a home health agency. She saw her PCP for hospital follow up.   She then presented to the hospital again in February for vague abdominal pain.  She had CT chest, abdomen, pelvis and brain MRI.  No clear etiology was found for her abdominal pain except for possible  suspected urinary tract infection.  Patient mentions during one of her hospitalization she was given iron infusions.  She is also been taking ferrous sulfate p.o. daily.  Today, she denies any fevers.  She states she has talked to all of her providers about that she often feels like a "motor" is in her body and sometimes she fibrates randomly.  Denies any night sweats.  Her breathing is worse since having influenza A  although it is a little bit better since being discharged from the hospital.  She still on 3 L of supplemental oxygen.  Denies any hemoptysis.  She sometimes gets sporadic nausea and vomiting at baseline but reports she has not had as much recently.  She had some diarrhea during the hospitalization but that also improved.  Denies any headache or visual changes.  She recently had repeat blood work and is here for a follow-up visit.    MEDICAL HISTORY: Past Medical History:  Diagnosis Date   Anemia    "long time ago" (12/06/2012)   Anxiety    Arthritis    "right knee real bad; in my hands bad" (12/06/2012)   Asthma    Celiac disease    Colon polyps    COPD (chronic obstructive pulmonary disease) (HCC)    Coronary artery disease    a. s/p Xience DES to [facility] 04/2008;  b. LHC 05/2008: Proximal RCA 25%, mid RCA stent patent.  c. Anormal nuc 2014 -> s/p LHC with severe mRCA stenosis s/p DES. d. Cath 04/2013 s/p DES to LAD.  e. LHC 08/2015 was stable.   Depression    "years ago" (12/06/2012)   Fibromyalgia    Gallstones    GERD (gastroesophageal reflux disease)    H/O hiatal hernia    Hepatitis    "not A, B, or C" (12/06/2012)   Hyperlipidemia    intol to statins and other chol agents due to elevated LFTs   Hypertension    Hypothyroidism    IBS (irritable bowel syndrome)    Interstitial cystitis    Lung cancer ([place]) dx'd 01/2018   Lung nodules    Bilateral   Migraines    "when I was younger" (12/06/2012)   Pneumonia    PONV (postoperative nausea and vomiting)    "Very bad"   Premature atrial contractions    PVC's (premature ventricular contractions)    Sinus headache    SVT (supraventricular tachycardia)     ALLERGIES:  is allergic to ciprofloxacin, levofloxacin, statins, tricor [fenofibrate], compazine [prochlorperazine], latex, codeine, and metoprolol.  MEDICATIONS:  Current Outpatient Medications  Medication Sig Dispense Refill   gabapentin (NEURONTIN) 100 MG capsule Take 1 capsule  (100 mg total) by mouth 3 (three) times daily. 90 capsule 2   adagrasib (KRAZATI) 200 MG tablet Take 2 tablets (400 mg total) by mouth 2 (two) times daily. 180 tablet 3   albuterol (PROVENTIL) (5 MG/ML) 0.5% nebulizer solution Take 2.5 mg by nebulization every 6 (six) hours as needed for wheezing.     albuterol (VENTOLIN HFA) 108 (90 Base) MCG/ACT inhaler Inhale 1 puff into the lungs every 6 (six) hours as needed (wheezing/shortness of breath.).      aspirin 81 MG chewable tablet Chew 1 tablet (81 mg total) by mouth daily.     benzonatate (TESSALON) 200 MG capsule Take 1 capsule (200 mg total) by mouth 3 (three) times daily as needed for cough. 20 capsule 0   Budeson-Glycopyrrol-Formoterol (BREZTRI AEROSPHERE) 160-9-4.8 MCG/ACT AERO Inhale 2 puffs into the lungs  in the morning and at bedtime. 10.7 g 5   buPROPion (WELLBUTRIN XL) 150 MG 24 hr tablet Take 1-2 tablets by mouth 2 (two) times daily. Patient takes 2 tablets ('300mg'$ ) in the morning and 1 tablet ('150mg'$ ) in the evening     cyanocobalamin 1000 MCG tablet Take 1 tablet (1,000 mcg total) by mouth daily. 100 tablet 0   dicyclomine (BENTYL) 10 MG capsule Take 1 capsule (10 mg total) by mouth every 8 (eight) hours as needed for spasms. 20 capsule 0   ferrous sulfate 325 (65 FE) MG EC tablet Take 1 tablet (325 mg total) by mouth daily with breakfast. 30 tablet 1   HYDROcodone bit-homatropine (HYCODAN) 5-1.5 MG/5ML syrup Take 5 mLs by mouth every 6 (six) hours as needed for cough. 120 mL 0   HYDROcodone-acetaminophen (NORCO/VICODIN) 5-325 MG tablet Take 1 tablet by mouth 4 (four) times daily as needed (pain).     ipratropium-albuterol (DUONEB) 0.5-2.5 (3) MG/3ML SOLN Inhale 3 mLs into the lungs every 6 (six) hours as needed (Asthma). 360 mL 3   loperamide (IMODIUM A-D) 2 MG tablet Take 1 tablet (2 mg total) by mouth 4 (four) times daily as needed for diarrhea or loose stools. 20 tablet 0   LORazepam (ATIVAN) 2 MG tablet Take 2 mg by mouth every  evening.     metFORMIN (GLUCOPHAGE-XR) 500 MG 24 hr tablet Take 1 tablet (500 mg total) by mouth daily with breakfast. 30 tablet 2   nitroGLYCERIN (NITROSTAT) 0.4 MG SL tablet Place 1 tablet (0.4 mg total) under the tongue every 5 (five) minutes as needed for chest pain. 25 tablet 12   ondansetron (ZOFRAN-ODT) 4 MG disintegrating tablet Take 1 tablet (4 mg total) by mouth every 8 (eight) hours as needed for nausea or vomiting. 30 tablet 2   pantoprazole (PROTONIX) 40 MG tablet Take 1 tablet (40 mg total) by mouth daily. 30 tablet 0   Vitamin D, Ergocalciferol, (DRISDOL) 1.25 MG (50000 UT) CAPS capsule Take 50,000 Units by mouth every 7 (seven) days. Wednesdays     No current facility-administered medications for this visit.    SURGICAL HISTORY:  Past Surgical History:  Procedure Laterality Date   CARDIAC CATHETERIZATION  04/2008; 05/2008   CARDIAC CATHETERIZATION N/A 08/07/2015   Procedure: Left Heart Cath and Coronary Angiography;  Surgeon: Sherren Mocha, MD;  Location: [place];  Service: Cardiovascular;  Laterality: N/A;   CHOLECYSTECTOMY     CORONARY ANGIOPLASTY WITH STENT PLACEMENT  04/2008 12/06/2012   "1 + 1" (12/06/2012)   CORONARY STENT PLACEMENT  05/08/2013   DES TO LAD      DR MCALHANY   IR IMAGING GUIDED PORT INSERTION  04/29/2018   LEFT HEART CATHETERIZATION WITH CORONARY ANGIOGRAM N/A 12/06/2012   Procedure: LEFT HEART CATHETERIZATION WITH CORONARY ANGIOGRAM;  Surgeon: [name], MD;  Location: [facility] CATH LAB;  Service: Cardiovascular;  Laterality: N/A;   LEFT HEART CATHETERIZATION WITH CORONARY ANGIOGRAM N/A 05/08/2013   Procedure: LEFT HEART CATHETERIZATION WITH CORONARY ANGIOGRAM;  Surgeon: [name], MD;  Location: [facility] CATH LAB;  Service: Cardiovascular;  Laterality: N/A;   LEFT HEART CATHETERIZATION WITH CORONARY ANGIOGRAM N/A 01/04/2014   Procedure: LEFT HEART CATHETERIZATION WITH CORONARY ANGIOGRAM;  Surgeon: [name], MD;  Location:  [facility] CATH LAB;  Service: Cardiovascular;  Laterality: N/A;   PERCUTANEOUS CORONARY STENT INTERVENTION (PCI-S)  12/06/2012   Procedure: PERCUTANEOUS CORONARY STENT INTERVENTION (PCI-S);  Surgeon: [name], MD;  Location: [facility] CATH LAB;  Service: Cardiovascular;;   PERCUTANEOUS CORONARY STENT INTERVENTION (PCI-S)  05/08/2013   Procedure: PERCUTANEOUS CORONARY STENT INTERVENTION (PCI-S);  Surgeon: [name], MD;  Location: [facility] CATH LAB;  Service: Cardiovascular;;  mid LAD    TUBAL LIGATION     VAGINAL HYSTERECTOMY     VIDEO BRONCHOSCOPY WITH ENDOBRONCHIAL NAVIGATION N/A 02/17/2018   Procedure: VIDEO BRONCHOSCOPY WITH ENDOBRONCHIAL NAVIGATION;  Surgeon: Melrose Nakayama, MD;  Location: [facility] OR;  Service: Thoracic;  Laterality: N/A;    REVIEW OF SYSTEMS:   Review of Systems  Constitutional: + chronic fatigue . negative for appetite change, fever and unexpected weight change.  HENT: Negative for mouth sores, nosebleeds, sore throat and trouble swallowing.   Eyes: Negative for eye problems and icterus.  Respiratory: Positive for chronic baseline dyspnea on exertion and occasional cough.  Negative for  hemoptysis and wheezing.   Cardiovascular: Negative for chest pain and leg swelling.  Gastrointestinal: Nausea for intermittent nausea and vomiting.  Positive for improved diarrhea. negative for  Genitourinary: Negative for bladder incontinence, difficulty urinating, dysuria, frequency and hematuria.   Musculoskeletal: positive for chronic back pain.  Negative for gait problem, neck pain and neck stiffness.  Skin: Negative for itching and rash.  Neurological: Negative for dizziness, extremity weakness, gait problem, headaches, light-headedness and seizures.  Hematological: Negative for adenopathy. Does not bruise/bleed easily.  Psychiatric/Behavioral: Negative for confusion, depression and sleep disturbance. The patient is not nervous/anxious.     PHYSICAL EXAMINATION:  Blood  pressure 112/81, pulse 85, temperature 98.8 F (37.1 C), temperature source Oral, resp. rate 16, SpO2 93 %.  ECOG PERFORMANCE STATUS: 2-3  Physical Exam  Constitutional: Oriented to person, place, and time and chronically ill-appearing female and in no distress.  HENT:  Head: Normocephalic and atraumatic.  Mouth/Throat: Oropharynx is clear and moist. No oropharyngeal exudate.  Eyes: Conjunctivae are normal. Right eye exhibits no discharge. Left eye exhibits no discharge. No scleral icterus.  Neck: Normal range of motion. Neck supple.  Cardiovascular: Normal rate, regular rhythm, normal heart sounds and intact distal pulses.   Pulmonary/Chest: Effort normal.  Quiet breath sounds bilaterally.  On supplemental oxygen.  No respiratory distress. No wheezes. No rales.  Abdominal: Soft. Bowel sounds are normal. Exhibits no distension and no mass. There is no tenderness.  Musculoskeletal: Normal range of motion. Exhibits no edema.  Lymphadenopathy:    No cervical adenopathy.  Neurological: Alert and oriented to person, place, and time. Exhibits muscle wasting. Examined in the wheelchair.   Skin: Skin is warm and dry. No rash noted. Not diaphoretic. No erythema. No pallor.  Psychiatric: Mood, memory and judgment normal.  Vitals reviewed.  LABORATORY DATA: Lab Results  Component Value Date   WBC 8.0 05/07/2022   HGB 12.0 05/07/2022   HCT 37.2 05/07/2022   MCV 93.7 05/07/2022   PLT 203 05/07/2022      Chemistry      Component Value Date/Time   NA 141 05/07/2022 0924   NA 143 01/10/2018 1629   K 4.9 05/07/2022 0924   CL 108 05/07/2022 0924   CO2 27 05/07/2022 0924   BUN 22 05/07/2022 0924   BUN 14 01/10/2018 1629   CREATININE 1.34 (H) 05/07/2022 0924   CREATININE 0.68 08/05/2015 1456      Component Value Date/Time   CALCIUM 9.6 05/07/2022 0924   ALKPHOS 73 05/07/2022 0924   AST 16 05/07/2022 0924   ALT 16 05/07/2022 0924   BILITOT 0.5 05/07/2022 [id number]  RADIOGRAPHIC  STUDIES:  MR BRAIN WO CONTRAST  Result Date: 04/10/2022 CLINICAL DATA:  Mental status change, unknown cause. EXAM: MRI HEAD WITHOUT CONTRAST TECHNIQUE: Multiplanar, multiecho pulse sequences of the brain and surrounding structures were obtained without intravenous contrast. COMPARISON:  MRI brain 03/06/2022. FINDINGS: Brain: Motion degraded study. No acute infarct or hemorrhage. No mass or midline shift. Few areas of T2 hyperintensity in the cerebral white matter unchanged and likely due to chronic small-vessel disease. No hydrocephalus or extra-axial collection. Vascular: Normal flow voids. Skull and upper cervical spine: Normal marrow signal. Sinuses/Orbits: Mucous retention cyst in the left maxillary sinus. Other: None. IMPRESSION: No acute intracranial abnormality or mass. Electronically Signed   By: Emmit Alexanders M.D.   On: 04/10/2022 15:28   CT Angio Chest PE W and/or Wo Contrast  Result Date: 04/10/2022 CLINICAL DATA:  Pulmonary embolism (PE) suspected, low to intermediate prob, positive D-dimer; Abdominal pain, acute, nonlocalized. Mental status change. Dyspnea. History of lung cancer. * Tracking Code: BO * EXAM: CT ANGIOGRAPHY CHEST CT ABDOMEN AND PELVIS WITH CONTRAST TECHNIQUE: Multidetector CT imaging of the chest was performed using the standard protocol during bolus administration of intravenous contrast. Multiplanar CT image reconstructions and MIPs were obtained to evaluate the vascular anatomy. Multidetector CT imaging of the abdomen and pelvis was performed using the standard protocol during bolus administration of intravenous contrast. RADIATION DOSE REDUCTION: This exam was performed according to the departmental dose-optimization program which includes automated exposure control, adjustment of the mA and/or kV according to patient size and/or use of iterative reconstruction technique. CONTRAST:  133m OMNIPAQUE IOHEXOL 350 MG/ML SOLN COMPARISON:  03/20/2022 chest CT. 02/12/2022 PET-CT.  10/04/2018 CT abdomen/pelvis. FINDINGS: CTA CHEST FINDINGS Cardiovascular: The study is moderate quality for the evaluation of pulmonary embolism, limited by significant motion degradation. There are no filling defects in the central, lobar, segmental or subsegmental pulmonary artery branches to suggest acute pulmonary embolism. Great vessels are normal in course and caliber. Normal heart size. No significant pericardial fluid/thickening. Left anterior descending and right coronary atherosclerosis/stents. Right internal jugular Port-A-Cath terminates at the cavoatrial junction. Mediastinum/Nodes: No significant thyroid nodules. Unremarkable esophagus. No pathologically enlarged axillary, mediastinal or hilar lymph nodes. Lungs/Pleura: No pneumothorax. No pleural effusion. Severe centrilobular emphysema. Sub solid 2.3 x 1.7 cm peripheral right upper lobe pulmonary nodule (series 11/image 54), stable from recent 03/20/2022 chest CT using similar measurement technique. Sub solid 1.4 x 1.1 cm superior segment left lower lobe nodule (series 11/image 44), stable. No acute consolidative airspace disease or new significant pulmonary nodules. Musculoskeletal: No aggressive appearing focal osseous lesions. Mild thoracic spondylosis. Chronic mild T5 vertebral compression fracture. Review of the MIP images confirms the above findings. CT ABDOMEN and PELVIS FINDINGS Hepatobiliary: Normal liver with no liver mass. Cholecystectomy. No biliary ductal dilatation. Pancreas: Normal, with no mass or duct dilation. Spleen: Normal size. No mass. Adrenals/Urinary Tract: Normal adrenals. Normal kidneys with no hydronephrosis and no renal mass. Normal bladder. Stomach/Bowel: Normal non-distended stomach. Normal caliber small bowel with no small bowel wall thickening. Normal appendix. Scattered mild colonic diverticulosis with no large bowel wall thickening or significant pericolonic fat stranding. Vascular/Lymphatic: Atherosclerotic  nonaneurysmal abdominal aorta. Patent portal, splenic, hepatic and renal veins. No pathologically enlarged lymph nodes in the abdomen or pelvis. Reproductive: Status post hysterectomy, with no abnormal findings at the vaginal cuff. Simple 3.0 cm left adnexal cyst (series 5/image 75), new from 02/12/2022 PET-CT. No right adnexal mass. Other: No pneumoperitoneum, ascites or focal fluid collection. Musculoskeletal: No aggressive appearing  focal osseous lesions. Marked degenerative disc disease at L5-S1. Review of the MIP images confirms the above findings. IMPRESSION: 1. Limited motion degraded scan.  No evidence of pulmonary embolism. 2. Subsolid 2.3 cm peripheral right upper lobe and 1.4 cm superior segment left lower lobe pulmonary nodules, unchanged from recent 03/20/2022 chest CT. Multifocal pulmonary malignancy not excluded. No thoracic adenopathy. No evidence of metastatic disease in the abdomen or pelvis. 3. No acute pulmonary disease. No acute abnormality in the abdomen or pelvis. No evidence of bowel obstruction or acute bowel inflammation. Mild colonic diverticulosis. 4. Simple 3.0 cm left adnexal cyst, new from 02/12/2022 PET-CT. No follow-up imaging is recommended. Reference: JACR 2020 Feb;17(2):248-254 5. Aortic Atherosclerosis (ICD10-I70.0) and Emphysema (ICD10-J43.9). Electronically Signed   By: Ilona Sorrel M.D.   On: 04/10/2022 12:25   CT ABDOMEN PELVIS W CONTRAST  Result Date: 04/10/2022 CLINICAL DATA:  Pulmonary embolism (PE) suspected, low to intermediate prob, positive D-dimer; Abdominal pain, acute, nonlocalized. Mental status change. Dyspnea. History of lung cancer. * Tracking Code: BO * EXAM: CT ANGIOGRAPHY CHEST CT ABDOMEN AND PELVIS WITH CONTRAST TECHNIQUE: Multidetector CT imaging of the chest was performed using the standard protocol during bolus administration of intravenous contrast. Multiplanar CT image reconstructions and MIPs were obtained to evaluate the vascular anatomy.  Multidetector CT imaging of the abdomen and pelvis was performed using the standard protocol during bolus administration of intravenous contrast. RADIATION DOSE REDUCTION: This exam was performed according to the departmental dose-optimization program which includes automated exposure control, adjustment of the mA and/or kV according to patient size and/or use of iterative reconstruction technique. CONTRAST:  137m OMNIPAQUE IOHEXOL 350 MG/ML SOLN COMPARISON:  03/20/2022 chest CT. 02/12/2022 PET-CT. 10/04/2018 CT abdomen/pelvis. FINDINGS: CTA CHEST FINDINGS Cardiovascular: The study is moderate quality for the evaluation of pulmonary embolism, limited by significant motion degradation. There are no filling defects in the central, lobar, segmental or subsegmental pulmonary artery branches to suggest acute pulmonary embolism. Great vessels are normal in course and caliber. Normal heart size. No significant pericardial fluid/thickening. Left anterior descending and right coronary atherosclerosis/stents. Right internal jugular Port-A-Cath terminates at the cavoatrial junction. Mediastinum/Nodes: No significant thyroid nodules. Unremarkable esophagus. No pathologically enlarged axillary, mediastinal or hilar lymph nodes. Lungs/Pleura: No pneumothorax. No pleural effusion. Severe centrilobular emphysema. Sub solid 2.3 x 1.7 cm peripheral right upper lobe pulmonary nodule (series 11/image 54), stable from recent 03/20/2022 chest CT using similar measurement technique. Sub solid 1.4 x 1.1 cm superior segment left lower lobe nodule (series 11/image 44), stable. No acute consolidative airspace disease or new significant pulmonary nodules. Musculoskeletal: No aggressive appearing focal osseous lesions. Mild thoracic spondylosis. Chronic mild T5 vertebral compression fracture. Review of the MIP images confirms the above findings. CT ABDOMEN and PELVIS FINDINGS Hepatobiliary: Normal liver with no liver mass. Cholecystectomy. No  biliary ductal dilatation. Pancreas: Normal, with no mass or duct dilation. Spleen: Normal size. No mass. Adrenals/Urinary Tract: Normal adrenals. Normal kidneys with no hydronephrosis and no renal mass. Normal bladder. Stomach/Bowel: Normal non-distended stomach. Normal caliber small bowel with no small bowel wall thickening. Normal appendix. Scattered mild colonic diverticulosis with no large bowel wall thickening or significant pericolonic fat stranding. Vascular/Lymphatic: Atherosclerotic nonaneurysmal abdominal aorta. Patent portal, splenic, hepatic and renal veins. No pathologically enlarged lymph nodes in the abdomen or pelvis. Reproductive: Status post hysterectomy, with no abnormal findings at the vaginal cuff. Simple 3.0 cm left adnexal cyst (series 5/image 75), new from 02/12/2022 PET-CT. No right adnexal mass. Other: No pneumoperitoneum, ascites or  focal fluid collection. Musculoskeletal: No aggressive appearing focal osseous lesions. Marked degenerative disc disease at L5-S1. Review of the MIP images confirms the above findings. IMPRESSION: 1. Limited motion degraded scan.  No evidence of pulmonary embolism. 2. Subsolid 2.3 cm peripheral right upper lobe and 1.4 cm superior segment left lower lobe pulmonary nodules, unchanged from recent 03/20/2022 chest CT. Multifocal pulmonary malignancy not excluded. No thoracic adenopathy. No evidence of metastatic disease in the abdomen or pelvis. 3. No acute pulmonary disease. No acute abnormality in the abdomen or pelvis. No evidence of bowel obstruction or acute bowel inflammation. Mild colonic diverticulosis. 4. Simple 3.0 cm left adnexal cyst, new from 02/12/2022 PET-CT. No follow-up imaging is recommended. Reference: JACR 2020 Feb;17(2):248-254 5. Aortic Atherosclerosis (ICD10-I70.0) and Emphysema (ICD10-J43.9). Electronically Signed   By: Ilona Sorrel M.D.   On: 04/10/2022 12:25   CT Head Wo Contrast  Result Date: 04/10/2022 CLINICAL DATA:  Mental status  change, unknown cause. EXAM: CT HEAD WITHOUT CONTRAST TECHNIQUE: Contiguous axial images were obtained from the base of the skull through the vertex without intravenous contrast. RADIATION DOSE REDUCTION: This exam was performed according to the departmental dose-optimization program which includes automated exposure control, adjustment of the mA and/or kV according to patient size and/or use of iterative reconstruction technique. COMPARISON:  Head CT 10/11/2019. FINDINGS: Brain: No acute hemorrhage, mass effect or midline shift. Gray-white differentiation is preserved. No hydrocephalus. No extra-axial collection. Basilar cisterns are patent. Vascular: No hyperdense vessel or unexpected calcification. Skull: No calvarial fracture or suspicious bone lesion. Skull base is unremarkable. Sinuses/Orbits: Unremarkable. Other: None. IMPRESSION: No acute intracranial abnormality. Electronically Signed   By: Emmit Alexanders M.D.   On: 04/10/2022 12:07   DG Chest Portable 1 View  Result Date: 04/10/2022 CLINICAL DATA:  Shortness of breath. EXAM: PORTABLE CHEST 1 VIEW COMPARISON:  03/15/2022 FINDINGS: Right jugular Port-A-Cath is stable with the tip near the superior cavoatrial junction. Heart size is within normal limits and stable. Densities near the left costophrenic angle appear to be chronic. No new airspace disease or pulmonary edema. Negative for a pneumothorax. IMPRESSION: No active disease. Electronically Signed   By: Markus Daft M.D.   On: 04/10/2022 10:05     ASSESSMENT/PLAN:  This is a very pleasant 67 year old Caucasian female diagnosed with stage IV non-small cell lung cancer, adenocarcinoma. She is negative for any actionable mutations and her PD-L1 expression is 1%. She was diagnosed in December 2019. She presented with bilateral pulmonary nodules.    She initially started treatment with carboplatin, Alimta, and Keytruda. She was status post 4 cycles but Keytruda was discontinued after cycle #2 due to  his significant skin rash. She then was treated with maintenance single agent Alimta for 3 cycles. This has been on hold since June 2020 due to worsening renal insufficiency.   She has been on observation since that time and feeling fairly well except she does have significant COPD for which she is on supplemental oxygen. She follows closely with pulmonology.    In November 2023 the patient had a repeat CT scan which showed new area of nodularity with potential endobronchial extension and lymph node adjacent to the endobronchial material in the right hilu that is suspicious and a new solid nodule in the medial right lower lobe concerning for additional site of disease.   She had a PET scan which showed evidence of disease progression with multiple bilateral solid and subsolid nodules.   Due to the patient's history of intolerance to chemotherapy  she was started on second line targeted treatment with Krazati 600 mg twice daily.  Her first dose was on 02/20/22.  Dose was reduced to 400 mg twice daily around 03/03/2022 due to ongoing symptoms of nausea, vomiting, and diarrhea.  The patient was seen with Dr. Julien Nordmann today.  The patient did have repeat brain MRI, CT, chest, abdomen, pelvis while admitted to the hospital.   Patient was seen with Dr. Julien Nordmann today.  Dr. Julien Nordmann personally independently reviewed the scan and discussed the scans with the patient today.  The scans were table  Dr. Julien Nordmann recommends a resume Krazati at 400 mg twice daily.  See her back for follow-up visit in 2 weeks for evaluation and repeat blood work..   I have refilled her ferrous sulfate.  Her hemoglobin has significantly improved today.  I also discussed gabapentin for postherpetic neuralgia.  We discussed ways to titrate up taking this if needed.  She will start with 100 mg tablets but we discussed the max dose can be titrated up to 3 tablets 3 times daily if needed.  The patient was advised to call immediately if she has  any concerning symptoms in the interval. The patient voices understanding of current disease status and treatment options and is in agreement with the current care plan. All questions were answered. The patient knows to call the clinic with any problems, questions or concerns. We can certainly see the patient much sooner if necessary     Orders Placed This Encounter  Procedures   CBC with Differential ([place] Only)    Standing Status:   Future    Standing Expiration Date:   05/07/2023   CMP ([place] only)    Standing Status:   Future    Standing Expiration Date:   05/07/2023     Tobe Sos Makeya Hilgert, PA-C 05/07/22  ADDENDUM: Hematology/Oncology Attending: I had a face-to-face encounter with the patient today.  I reviewed her records, lab and recommended her care plan.  This is a very pleasant 67 years old white female with a stage IV non-small cell lung cancer, adenocarcinoma that was diagnosed in December 2019 with positive KRAS G12C mutation and PD-L1 expression of 1%.  The patient started systemic chemotherapy initially with carboplatin, Alimta and Keytruda for 4 cycles but Keytruda was discontinued starting from cycle #3 because of toxicity issues.  The patient was then on observation and she had evidence for disease progression and resumed her treatment with single agent Alimta for 3 cycles but this was on hold since August 23, 2018 secondary to renal insufficiency.  She had evidence for disease progression few months ago and the patient started treatment with Alfred Levins (Adagrasib) 600 mg p.o. daily on February 20, 2022 but her dose was dropped to 400 mg p.o. twice daily on March 10, 2022.  She was in the hospital recently and her treatment has been on hold since March 15, 2022.  She has been tolerating the lower dose much better with no concerning complaints.  Her most imaging studies showed no concerning evidence for disease progression. I recommended for the patient to  resume her treatment with Alfred Levins (Adagrasib) at 400 mg p.o. twice daily and will continue to monitor her closely.  She will come back for follow-up visit and 2 weeks for evaluation and repeat blood work. The patient was advised to call immediately if she has any other concerning symptoms in the interval. The total time spent in the appointment was 30 minutes. Disclaimer: This note  was dictated with voice recognition software. Similar sounding words can inadvertently be transcribed and may be missed upon review. Eilleen Kempf, MD

## 2022-05-07 ENCOUNTER — Other Ambulatory Visit: Payer: Managed Care, Other (non HMO)

## 2022-05-07 ENCOUNTER — Inpatient Hospital Stay: Payer: Managed Care, Other (non HMO)

## 2022-05-07 ENCOUNTER — Encounter: Payer: Self-pay | Admitting: Internal Medicine

## 2022-05-07 ENCOUNTER — Inpatient Hospital Stay: Payer: Managed Care, Other (non HMO) | Attending: Physician Assistant

## 2022-05-07 ENCOUNTER — Inpatient Hospital Stay (HOSPITAL_BASED_OUTPATIENT_CLINIC_OR_DEPARTMENT_OTHER): Payer: Managed Care, Other (non HMO) | Admitting: Physician Assistant

## 2022-05-07 ENCOUNTER — Other Ambulatory Visit: Payer: Self-pay

## 2022-05-07 VITALS — BP 112/81 | HR 85 | Temp 98.8°F | Resp 16

## 2022-05-07 DIAGNOSIS — Z95828 Presence of other vascular implants and grafts: Secondary | ICD-10-CM

## 2022-05-07 DIAGNOSIS — N189 Chronic kidney disease, unspecified: Secondary | ICD-10-CM | POA: Insufficient documentation

## 2022-05-07 DIAGNOSIS — C3491 Malignant neoplasm of unspecified part of right bronchus or lung: Secondary | ICD-10-CM | POA: Diagnosis not present

## 2022-05-07 DIAGNOSIS — B0229 Other postherpetic nervous system involvement: Secondary | ICD-10-CM | POA: Insufficient documentation

## 2022-05-07 DIAGNOSIS — J4489 Other specified chronic obstructive pulmonary disease: Secondary | ICD-10-CM | POA: Diagnosis not present

## 2022-05-07 DIAGNOSIS — I129 Hypertensive chronic kidney disease with stage 1 through stage 4 chronic kidney disease, or unspecified chronic kidney disease: Secondary | ICD-10-CM | POA: Insufficient documentation

## 2022-05-07 DIAGNOSIS — C3492 Malignant neoplasm of unspecified part of left bronchus or lung: Secondary | ICD-10-CM

## 2022-05-07 DIAGNOSIS — R7401 Elevation of levels of liver transaminase levels: Secondary | ICD-10-CM | POA: Diagnosis not present

## 2022-05-07 LAB — CMP (CANCER CENTER ONLY)
ALT: 16 U/L (ref 0–44)
AST: 16 U/L (ref 15–41)
Albumin: 4.3 g/dL (ref 3.5–5.0)
Alkaline Phosphatase: 73 U/L (ref 38–126)
Anion gap: 6 (ref 5–15)
BUN: 22 mg/dL (ref 8–23)
CO2: 27 mmol/L (ref 22–32)
Calcium: 9.6 mg/dL (ref 8.9–10.3)
Chloride: 108 mmol/L (ref 98–111)
Creatinine: 1.34 mg/dL — ABNORMAL HIGH (ref 0.44–1.00)
GFR, Estimated: 43 mL/min — ABNORMAL LOW (ref >60)
Glucose, Bld: 115 mg/dL — ABNORMAL HIGH (ref 70–99)
Potassium: 4.9 mmol/L (ref 3.5–5.1)
Sodium: 141 mmol/L (ref 135–145)
Total Bilirubin: 0.5 mg/dL (ref 0.3–1.2)
Total Protein: 7.2 g/dL (ref 6.5–8.1)

## 2022-05-07 LAB — CBC WITH DIFFERENTIAL (CANCER CENTER ONLY)
Abs Immature Granulocytes: 0.02 10*3/uL (ref 0.00–0.07)
Basophils Absolute: 0.1 10*3/uL (ref 0.0–0.1)
Basophils Relative: 1 %
Eosinophils Absolute: 0.4 10*3/uL (ref 0.0–0.5)
Eosinophils Relative: 6 %
HCT: 37.2 % (ref 36.0–46.0)
Hemoglobin: 12 g/dL (ref 12.0–15.0)
Immature Granulocytes: 0 %
Lymphocytes Relative: 14 %
Lymphs Abs: 1.1 10*3/uL (ref 0.7–4.0)
MCH: 30.2 pg (ref 26.0–34.0)
MCHC: 32.3 g/dL (ref 30.0–36.0)
MCV: 93.7 fL (ref 80.0–100.0)
Monocytes Absolute: 0.6 10*3/uL (ref 0.1–1.0)
Monocytes Relative: 7 %
Neutro Abs: 5.7 10*3/uL (ref 1.7–7.7)
Neutrophils Relative %: 72 %
Platelet Count: 203 10*3/uL (ref 150–400)
RBC: 3.97 MIL/uL (ref 3.87–5.11)
RDW: 14.6 % (ref 11.5–15.5)
WBC Count: 8 10*3/uL (ref 4.0–10.5)
nRBC: 0 % (ref 0.0–0.2)

## 2022-05-07 MED ORDER — ALTEPLASE 2 MG IJ SOLR: 2.0000 mg | Freq: Once | INTRAMUSCULAR | Status: DC

## 2022-05-07 MED ORDER — FERROUS SULFATE 325 (65 FE) MG PO TBEC: 325.0000 mg | DELAYED_RELEASE_TABLET | Freq: Every day | ORAL | 1 refills | Status: DC

## 2022-05-07 MED ORDER — SODIUM CHLORIDE 0.9% FLUSH
10.0000 mL | Freq: Once | INTRAVENOUS | Status: AC
  Administered 2022-05-07: 10 mL

## 2022-05-07 MED ORDER — HEPARIN SOD (PORK) LOCK FLUSH 100 UNIT/ML IV SOLN
500.0000 [IU] | Freq: Once | INTRAVENOUS | Status: AC
  Administered 2022-05-07: 500 [IU]

## 2022-05-07 MED ORDER — GABAPENTIN 100 MG PO CAPS: 100.0000 mg | ORAL_CAPSULE | Freq: Three times a day (TID) | ORAL | 2 refills | Status: DC

## 2022-05-08 ENCOUNTER — Other Ambulatory Visit: Payer: Managed Care, Other (non HMO)

## 2022-05-08 ENCOUNTER — Ambulatory Visit: Payer: Managed Care, Other (non HMO) | Admitting: Physician Assistant

## 2022-05-09 ENCOUNTER — Encounter: Payer: Self-pay | Admitting: Internal Medicine

## 2022-05-13 ENCOUNTER — Other Ambulatory Visit (HOSPITAL_COMMUNITY): Payer: Self-pay

## 2022-05-19 NOTE — Progress Notes (Unsigned)
[facility] OFFICE PROGRESS NOTE  [name], NP [place] [address]  DIAGNOSIS:  Stage IV (T1a, N0, M1 a) non-small cell lung cancer, adenocarcinoma presented with multifocal disease in both lungs including 3 nodules in the left lung as well as 2 nodules in the right lung diagnosed in December 2019.      Molecular studies showed: KRAS G12C BCOR N1425S-subclonal RAD21 amplification   PDL 1 expression 1%.  PRIOR THERAPY: 1) Systemic chemotherapy with carboplatin for AUC of 5, Alimta 500 mg/M2 and Keytruda 200 mg IV every 3 weeks.  First dose April 12, 2018. Status post 4 cycles. starting from cycle #3, Keytruda was dropped and patient received treatment with Carboplatin for an AUC of 5 and Alimta 500 mg/m2 IV every 3 weeks.  2) Systemic chemotherapy with Alimta 500 mg/m2 IV every 3 weeks. Status post 3 cycles of single agent Alimta. Most recent dose given on 08/23/2018.  The treatment is currently on hold secondary to renal insufficiency  CURRENT THERAPY: Krazati 600 mg BID, first dose on 02/20/22. Dose reduced to 400 mg BID starting on 03/10/22. However, the patient states this was on hold since she was in the hospital on 03/15/22 and she had not resumed it until 05/07/22.    INTERVAL HISTORY: MARSHELL COBLER 67 y.o. female returns to the clinic today for follow-up visit.  The patient was last seen in clinic by Dr. Julien Nordmann and myself on 05/07/2022.  In summary, the patient had prior intolerance to chemotherapy in 2020.  She is found to have some disease progression in December 2023 for which she started targeted treatment with Alfred Levins.  She has been having some ongoing symptoms and her dose of Alfred Levins was ultimately reduced to 400 twice daily.  She then was hospitalized from 03/15/2022 to 03/31/2022 for dyspnea on exertion, AKI on CKD.  Her hospitalization was complicated by steroid-induced hyperglycemia and shingles.  He is currently taking gabapentin for  postherpetic neuralgia.  She is receiving home health care through her PCPs office ***  She was seen on 05/07/2022, she resumed her [place].  Thus far, she is tolerating it ***. Today, she denies any fevers.  She states she has talked to all of her providers about that she often feels like a "motor" is in her body and sometimes she fibrates randomly.  Denies any night sweats.  Her breathing is worse since having influenza A although it is a little bit better since being discharged from the hospital.  She still on 3 L of supplemental oxygen.  Denies any hemoptysis.  She sometimes gets sporadic nausea and vomiting at baseline but reports she has not had as much recently.  She had some diarrhea during the hospitalization but that also improved.  Denies any headache or visual changes.  She recently had repeat blood work and is here for a follow-up visit.    MEDICAL HISTORY: Past Medical History:  Diagnosis Date   Anemia    "long time ago" (12/06/2012)   Anxiety    Arthritis    "right knee real bad; in my hands bad" (12/06/2012)   Asthma    Celiac disease    Colon polyps    COPD (chronic obstructive pulmonary disease) (HCC)    Coronary artery disease    a. s/p Xience DES to [facility] 04/2008;  b. LHC 05/2008: Proximal RCA 25%, mid RCA stent patent.  c. Anormal nuc 2014 -> s/p LHC with severe mRCA stenosis s/p DES. d.  Cath 04/2013 s/p DES to LAD.  e. LHC 08/2015 was stable.   Depression    "years ago" (12/06/2012)   Fibromyalgia    Gallstones    GERD (gastroesophageal reflux disease)    H/O hiatal hernia    Hepatitis    "not A, B, or C" (12/06/2012)   Hyperlipidemia    intol to statins and other chol agents due to elevated LFTs   Hypertension    Hypothyroidism    IBS (irritable bowel syndrome)    Interstitial cystitis    Lung cancer ([place]) dx'd 01/2018   Lung nodules    Bilateral   Migraines    "when I was younger" (12/06/2012)   Pneumonia    PONV (postoperative nausea and vomiting)    "Very bad"    Premature atrial contractions    PVC's (premature ventricular contractions)    Sinus headache    SVT (supraventricular tachycardia)     ALLERGIES:  is allergic to ciprofloxacin, levofloxacin, statins, tricor [fenofibrate], compazine [prochlorperazine], latex, codeine, and metoprolol.  MEDICATIONS:  Current Outpatient Medications  Medication Sig Dispense Refill   adagrasib (KRAZATI) 200 MG tablet Take 2 tablets (400 mg total) by mouth 2 (two) times daily. 180 tablet 3   albuterol (PROVENTIL) (5 MG/ML) 0.5% nebulizer solution Take 2.5 mg by nebulization every 6 (six) hours as needed for wheezing.     albuterol (VENTOLIN HFA) 108 (90 Base) MCG/ACT inhaler Inhale 1 puff into the lungs every 6 (six) hours as needed (wheezing/shortness of breath.).      aspirin 81 MG chewable tablet Chew 1 tablet (81 mg total) by mouth daily.     benzonatate (TESSALON) 200 MG capsule Take 1 capsule (200 mg total) by mouth 3 (three) times daily as needed for cough. 20 capsule 0   Budeson-Glycopyrrol-Formoterol (BREZTRI AEROSPHERE) 160-9-4.8 MCG/ACT AERO Inhale 2 puffs into the lungs in the morning and at bedtime. 10.7 g 5   buPROPion (WELLBUTRIN XL) 150 MG 24 hr tablet Take 1-2 tablets by mouth 2 (two) times daily. Patient takes 2 tablets (300mg ) in the morning and 1 tablet (150mg ) in the evening     cyanocobalamin 1000 MCG tablet Take 1 tablet (1,000 mcg total) by mouth daily. 100 tablet 0   dicyclomine (BENTYL) 10 MG capsule Take 1 capsule (10 mg total) by mouth every 8 (eight) hours as needed for spasms. 20 capsule 0   ferrous sulfate 325 (65 FE) MG EC tablet Take 1 tablet (325 mg total) by mouth daily with breakfast. 30 tablet 1   gabapentin (NEURONTIN) 100 MG capsule Take 1 capsule (100 mg total) by mouth 3 (three) times daily. 90 capsule 2   HYDROcodone bit-homatropine (HYCODAN) 5-1.5 MG/5ML syrup Take 5 mLs by mouth every 6 (six) hours as needed for cough. 120 mL 0   HYDROcodone-acetaminophen (NORCO/VICODIN)  5-325 MG tablet Take 1 tablet by mouth 4 (four) times daily as needed (pain).     ipratropium-albuterol (DUONEB) 0.5-2.5 (3) MG/3ML SOLN Inhale 3 mLs into the lungs every 6 (six) hours as needed (Asthma). 360 mL 3   loperamide (IMODIUM A-D) 2 MG tablet Take 1 tablet (2 mg total) by mouth 4 (four) times daily as needed for diarrhea or loose stools. 20 tablet 0   LORazepam (ATIVAN) 2 MG tablet Take 2 mg by mouth every evening.     metFORMIN (GLUCOPHAGE-XR) 500 MG 24 hr tablet Take 1 tablet (500 mg total) by mouth daily with breakfast. 30 tablet 2   nitroGLYCERIN (NITROSTAT) 0.4 MG  SL tablet Place 1 tablet (0.4 mg total) under the tongue every 5 (five) minutes as needed for chest pain. 25 tablet 12   ondansetron (ZOFRAN-ODT) 4 MG disintegrating tablet Take 1 tablet (4 mg total) by mouth every 8 (eight) hours as needed for nausea or vomiting. 30 tablet 2   pantoprazole (PROTONIX) 40 MG tablet Take 1 tablet (40 mg total) by mouth daily. 30 tablet 0   Vitamin D, Ergocalciferol, (DRISDOL) 1.25 MG (50000 UT) CAPS capsule Take 50,000 Units by mouth every 7 (seven) days. Wednesdays     No current facility-administered medications for this visit.    SURGICAL HISTORY:  Past Surgical History:  Procedure Laterality Date   CARDIAC CATHETERIZATION  04/2008; 05/2008   CARDIAC CATHETERIZATION N/A 08/07/2015   Procedure: Left Heart Cath and Coronary Angiography;  Surgeon: Sherren Mocha, MD;  Location: [facility];  Service: Cardiovascular;  Laterality: N/A;   CHOLECYSTECTOMY     CORONARY ANGIOPLASTY WITH STENT PLACEMENT  04/2008 12/06/2012   "1 + 1" (12/06/2012)   CORONARY STENT PLACEMENT  05/08/2013   DES TO LAD      DR MCALHANY   IR IMAGING GUIDED PORT INSERTION  04/29/2018   LEFT HEART CATHETERIZATION WITH CORONARY ANGIOGRAM N/A 12/06/2012   Procedure: LEFT HEART CATHETERIZATION WITH CORONARY ANGIOGRAM;  Surgeon: [name], MD;  Location: [facility] CATH LAB;  Service: Cardiovascular;  Laterality: N/A;    LEFT HEART CATHETERIZATION WITH CORONARY ANGIOGRAM N/A 05/08/2013   Procedure: LEFT HEART CATHETERIZATION WITH CORONARY ANGIOGRAM;  Surgeon: [name], MD;  Location: [facility] CATH LAB;  Service: Cardiovascular;  Laterality: N/A;   LEFT HEART CATHETERIZATION WITH CORONARY ANGIOGRAM N/A 01/04/2014   Procedure: LEFT HEART CATHETERIZATION WITH CORONARY ANGIOGRAM;  Surgeon: [name], MD;  Location: [facility] CATH LAB;  Service: Cardiovascular;  Laterality: N/A;   PERCUTANEOUS CORONARY STENT INTERVENTION (PCI-S)  12/06/2012   Procedure: PERCUTANEOUS CORONARY STENT INTERVENTION (PCI-S);  Surgeon: [name], MD;  Location: [facility] CATH LAB;  Service: Cardiovascular;;   PERCUTANEOUS CORONARY STENT INTERVENTION (PCI-S)  05/08/2013   Procedure: PERCUTANEOUS CORONARY STENT INTERVENTION (PCI-S);  Surgeon: [name], MD;  Location: [facility] CATH LAB;  Service: Cardiovascular;;  mid LAD    TUBAL LIGATION     VAGINAL HYSTERECTOMY     VIDEO BRONCHOSCOPY WITH ENDOBRONCHIAL NAVIGATION N/A 02/17/2018   Procedure: VIDEO BRONCHOSCOPY WITH ENDOBRONCHIAL NAVIGATION;  Surgeon: Melrose Nakayama, MD;  Location: [facility];  Service: Thoracic;  Laterality: N/A;    REVIEW OF SYSTEMS:   Review of Systems  Constitutional: Negative for appetite change, chills, fatigue, fever and unexpected weight change.  HENT:   Negative for mouth sores, nosebleeds, sore throat and trouble swallowing.   Eyes: Negative for eye problems and icterus.  Respiratory: Negative for cough, hemoptysis, shortness of breath and wheezing.   Cardiovascular: Negative for chest pain and leg swelling.  Gastrointestinal: Negative for abdominal pain, constipation, diarrhea, nausea and vomiting.  Genitourinary: Negative for bladder incontinence, difficulty urinating, dysuria, frequency and hematuria.   Musculoskeletal: Negative for back pain, gait problem, neck pain and neck stiffness.  Skin: Negative for itching and rash.   Neurological: Negative for dizziness, extremity weakness, gait problem, headaches, light-headedness and seizures.  Hematological: Negative for adenopathy. Does not bruise/bleed easily.  Psychiatric/Behavioral: Negative for confusion, depression and sleep disturbance. The patient is not nervous/anxious.     PHYSICAL EXAMINATION:  There were no vitals taken for this visit.  ECOG PERFORMANCE STATUS: {CHL ONC ECOG [id number]  Physical Exam  Constitutional: Oriented to person, place, and time and well-developed, well-nourished, and in no distress. No distress.  HENT:  Head: Normocephalic and atraumatic.  Mouth/Throat: Oropharynx is clear and moist. No oropharyngeal exudate.  Eyes: Conjunctivae are normal. Right eye exhibits no discharge. Left eye exhibits no discharge. No scleral icterus.  Neck: Normal range of motion. Neck supple.  Cardiovascular: Normal rate, regular rhythm, normal heart sounds and intact distal pulses.   Pulmonary/Chest: Effort normal and breath sounds normal. No respiratory distress. No wheezes. No rales.  Abdominal: Soft. Bowel sounds are normal. Exhibits no distension and no mass. There is no tenderness.  Musculoskeletal: Normal range of motion. Exhibits no edema.  Lymphadenopathy:    No cervical adenopathy.  Neurological: Alert and oriented to person, place, and time. Exhibits normal muscle tone. Gait normal. Coordination normal.  Skin: Skin is warm and dry. No rash noted. Not diaphoretic. No erythema. No pallor.  Psychiatric: Mood, memory and judgment normal.  Vitals reviewed.  LABORATORY DATA: Lab Results  Component Value Date   WBC 8.0 05/07/2022   HGB 12.0 05/07/2022   HCT 37.2 05/07/2022   MCV 93.7 05/07/2022   PLT 203 05/07/2022      Chemistry      Component Value Date/Time   NA 141 05/07/2022 0924   NA 143 01/10/2018 1629   K 4.9 05/07/2022 0924   CL 108 05/07/2022 0924   CO2 27 05/07/2022 0924   BUN 22 05/07/2022 0924   BUN 14 01/10/2018  1629   CREATININE 1.34 (H) 05/07/2022 0924   CREATININE 0.68 08/05/2015 1456      Component Value Date/Time   CALCIUM 9.6 05/07/2022 0924   ALKPHOS 73 05/07/2022 0924   AST 16 05/07/2022 0924   ALT 16 05/07/2022 0924   BILITOT 0.5 05/07/2022 0924       RADIOGRAPHIC STUDIES:  No results found.   ASSESSMENT/PLAN:  This is a very pleasant 67 year old Caucasian female diagnosed with stage IV non-small cell lung cancer, adenocarcinoma. She is negative for any actionable mutations and her PD-L1 expression is 1%. She was diagnosed in December 2019. She presented with bilateral pulmonary nodules.    She initially started treatment with carboplatin, Alimta, and Keytruda. She was status post 4 cycles but Keytruda was discontinued after cycle #2 due to his significant skin rash. She then was treated with maintenance single agent Alimta for 3 cycles. This has been on hold since June 2020 due to worsening renal insufficiency.  She has been on observation since that time and feeling fairly well except she does have significant COPD for which she is on supplemental oxygen. She follows closely with pulmonology.    In November 2023 the patient had a repeat CT scan which showed new area of nodularity with potential endobronchial extension and lymph node adjacent to the endobronchial material in the right hilu that is suspicious and a new solid nodule in the medial right lower lobe concerning for additional site of disease.   She had a PET scan which showed evidence of disease progression with multiple bilateral solid and subsolid nodules.  Due to the patient's history of intolerance to chemotherapy she was started on second line targeted treatment with Krazati 600 mg twice daily.  Her first dose was on 02/20/22.  Dose was reduced to 400 mg twice daily around 03/03/2022 due to ongoing symptoms of nausea, vomiting, and diarrhea. She resumed this on 05/07/22. Thus far ***, she is tolerating it ***.   The  patient was seen with  Dr. Julien Nordmann. Labs were reviewed. Recommend ***  F/U???  Iron supplement.   Gabapentin  The patient was advised to call immediately if she has any concerning symptoms in the interval. The patient voices understanding of current disease status and treatment options and is in agreement with the current care plan. All questions were answered. The patient knows to call the clinic with any problems, questions or concerns. We can certainly see the patient much sooner if necessary      No orders of the defined types were placed in this encounter.    I spent {CHL ONC TIME VISIT - [id number] counseling the patient face to face. The total time spent in the appointment was {CHL ONC TIME VISIT - [id number].  Trystyn Sitts L Sylvester Minton, PA-C 05/19/22

## 2022-05-21 ENCOUNTER — Other Ambulatory Visit: Payer: Self-pay

## 2022-05-21 ENCOUNTER — Inpatient Hospital Stay: Payer: Managed Care, Other (non HMO)

## 2022-05-21 ENCOUNTER — Telehealth: Payer: Self-pay

## 2022-05-21 ENCOUNTER — Inpatient Hospital Stay: Payer: Managed Care, Other (non HMO) | Admitting: Physician Assistant

## 2022-05-21 VITALS — BP 134/89 | HR 90 | Temp 97.9°F | Resp 16 | Ht 67.0 in | Wt 177.6 lb

## 2022-05-21 DIAGNOSIS — C3492 Malignant neoplasm of unspecified part of left bronchus or lung: Secondary | ICD-10-CM | POA: Diagnosis not present

## 2022-05-21 DIAGNOSIS — R7989 Other specified abnormal findings of blood chemistry: Secondary | ICD-10-CM | POA: Diagnosis not present

## 2022-05-21 DIAGNOSIS — Z95828 Presence of other vascular implants and grafts: Secondary | ICD-10-CM

## 2022-05-21 LAB — CMP (CANCER CENTER ONLY)
ALT: 143 U/L — ABNORMAL HIGH (ref 0–44)
AST: 186 U/L (ref 15–41)
Albumin: 4.1 g/dL (ref 3.5–5.0)
Alkaline Phosphatase: 106 U/L (ref 38–126)
Anion gap: 6 (ref 5–15)
BUN: 26 mg/dL — ABNORMAL HIGH (ref 8–23)
CO2: 29 mmol/L (ref 22–32)
Calcium: 9 mg/dL (ref 8.9–10.3)
Chloride: 107 mmol/L (ref 98–111)
Creatinine: 1.35 mg/dL — ABNORMAL HIGH (ref 0.44–1.00)
GFR, Estimated: 43 mL/min — ABNORMAL LOW (ref >60)
Glucose, Bld: 212 mg/dL — ABNORMAL HIGH (ref 70–99)
Potassium: 4.2 mmol/L (ref 3.5–5.1)
Sodium: 142 mmol/L (ref 135–145)
Total Bilirubin: 0.6 mg/dL (ref 0.3–1.2)
Total Protein: 6.8 g/dL (ref 6.5–8.1)

## 2022-05-21 LAB — CBC WITH DIFFERENTIAL (CANCER CENTER ONLY)
Abs Immature Granulocytes: 0.08 10*3/uL — ABNORMAL HIGH (ref 0.00–0.07)
Basophils Absolute: 0.1 10*3/uL (ref 0.0–0.1)
Basophils Relative: 1 %
Eosinophils Absolute: 0.2 10*3/uL (ref 0.0–0.5)
Eosinophils Relative: 3 %
HCT: 37 % (ref 36.0–46.0)
Hemoglobin: 11.8 g/dL — ABNORMAL LOW (ref 12.0–15.0)
Immature Granulocytes: 1 %
Lymphocytes Relative: 17 %
Lymphs Abs: 1.2 10*3/uL (ref 0.7–4.0)
MCH: 29.8 pg (ref 26.0–34.0)
MCHC: 31.9 g/dL (ref 30.0–36.0)
MCV: 93.4 fL (ref 80.0–100.0)
Monocytes Absolute: 0.8 10*3/uL (ref 0.1–1.0)
Monocytes Relative: 12 %
Neutro Abs: 4.6 10*3/uL (ref 1.7–7.7)
Neutrophils Relative %: 66 %
Platelet Count: 226 10*3/uL (ref 150–400)
RBC: 3.96 MIL/uL (ref 3.87–5.11)
RDW: 14.6 % (ref 11.5–15.5)
WBC Count: 6.9 10*3/uL (ref 4.0–10.5)
nRBC: 0 % (ref 0.0–0.2)

## 2022-05-21 MED ORDER — METHYLPREDNISOLONE 4 MG PO TBPK: ORAL_TABLET | ORAL | 0 refills | Status: DC

## 2022-05-21 MED ORDER — SODIUM CHLORIDE 0.9% FLUSH
10.0000 mL | Freq: Once | INTRAVENOUS | Status: AC
  Administered 2022-05-21: 10 mL

## 2022-05-21 MED ORDER — HEPARIN SOD (PORK) LOCK FLUSH 100 UNIT/ML IV SOLN
500.0000 [IU] | Freq: Once | INTRAVENOUS | Status: AC
  Administered 2022-05-21: 500 [IU]

## 2022-05-21 NOTE — Telephone Encounter (Signed)
This nurse reached out to patient and made aware of elevated liver function.  Advised that provider wants her to hold [place] for one week.  Labs to be redrawn next Thursday in the morning.  Provider will also call in a Medrol dose pack.  This nurse educated patient on how to take medication and advised to monitor for elevated blood sugars.  Encouraged to keep 1 month follow up appointment.     Patient acknowledged understanding. No questions or concerns noted at this time.

## 2022-05-21 NOTE — Telephone Encounter (Signed)
Critical lab value reported by Lelan Pons: AST [id number] Patient Call message and verbally notified Cassie H, PA-C and Alferd Apa, LPN

## 2022-05-22 ENCOUNTER — Telehealth: Payer: Self-pay | Admitting: Internal Medicine

## 2022-05-22 NOTE — Telephone Encounter (Signed)
Called patient regarding 03/21 scheduling message, patient is notified.

## 2022-05-27 ENCOUNTER — Telehealth: Payer: Self-pay | Admitting: Medical Oncology

## 2022-05-27 NOTE — Telephone Encounter (Signed)
"   I feel bad all over. RUQ discomfort . Denies n/v/d/c . Glucose today 120. " I felt better on the Krazati".  Alfred Levins held since last Thursday and Julien Nordmann will review labs before deciding about Alfred Levins.

## 2022-05-28 ENCOUNTER — Other Ambulatory Visit: Payer: Self-pay

## 2022-05-28 ENCOUNTER — Telehealth: Payer: Self-pay

## 2022-05-28 ENCOUNTER — Inpatient Hospital Stay: Payer: Managed Care, Other (non HMO)

## 2022-05-28 ENCOUNTER — Other Ambulatory Visit: Payer: Self-pay | Admitting: Physician Assistant

## 2022-05-28 DIAGNOSIS — C3492 Malignant neoplasm of unspecified part of left bronchus or lung: Secondary | ICD-10-CM

## 2022-05-28 DIAGNOSIS — R7989 Other specified abnormal findings of blood chemistry: Secondary | ICD-10-CM

## 2022-05-28 LAB — CMP (CANCER CENTER ONLY)
ALT: 58 U/L — ABNORMAL HIGH (ref 0–44)
AST: 30 U/L (ref 15–41)
Albumin: 4.1 g/dL (ref 3.5–5.0)
Alkaline Phosphatase: 83 U/L (ref 38–126)
Anion gap: 9 (ref 5–15)
BUN: 36 mg/dL — ABNORMAL HIGH (ref 8–23)
CO2: 27 mmol/L (ref 22–32)
Calcium: 9.3 mg/dL (ref 8.9–10.3)
Chloride: 105 mmol/L (ref 98–111)
Creatinine: 1.62 mg/dL — ABNORMAL HIGH (ref 0.44–1.00)
GFR, Estimated: 35 mL/min — ABNORMAL LOW (ref >60)
Glucose, Bld: 141 mg/dL — ABNORMAL HIGH (ref 70–99)
Potassium: 4.6 mmol/L (ref 3.5–5.1)
Sodium: 141 mmol/L (ref 135–145)
Total Bilirubin: 0.7 mg/dL (ref 0.3–1.2)
Total Protein: 7 g/dL (ref 6.5–8.1)

## 2022-05-28 LAB — CBC WITH DIFFERENTIAL (CANCER CENTER ONLY)
Abs Immature Granulocytes: 0.35 10*3/uL — ABNORMAL HIGH (ref 0.00–0.07)
Basophils Absolute: 0 10*3/uL (ref 0.0–0.1)
Basophils Relative: 0 %
Eosinophils Absolute: 0.1 10*3/uL (ref 0.0–0.5)
Eosinophils Relative: 2 %
HCT: 41.1 % (ref 36.0–46.0)
Hemoglobin: 13.1 g/dL (ref 12.0–15.0)
Immature Granulocytes: 4 %
Lymphocytes Relative: 17 %
Lymphs Abs: 1.6 10*3/uL (ref 0.7–4.0)
MCH: 30 pg (ref 26.0–34.0)
MCHC: 31.9 g/dL (ref 30.0–36.0)
MCV: 94.3 fL (ref 80.0–100.0)
Monocytes Absolute: 0.8 10*3/uL (ref 0.1–1.0)
Monocytes Relative: 9 %
Neutro Abs: 6.4 10*3/uL (ref 1.7–7.7)
Neutrophils Relative %: 68 %
Platelet Count: 217 10*3/uL (ref 150–400)
RBC: 4.36 MIL/uL (ref 3.87–5.11)
RDW: 14.7 % (ref 11.5–15.5)
WBC Count: 9.3 10*3/uL (ref 4.0–10.5)
nRBC: 0 % (ref 0.0–0.2)

## 2022-05-28 NOTE — Telephone Encounter (Signed)
This nurse reached out to patient and made her aware that her labs are looking a little better.  Advised that she can resume her [place].  Encouraged patient to increase fluid intake to decrease the risk of dehydration.  Patient acknowledged understanding and has no further questions or concerns.

## 2022-06-02 ENCOUNTER — Other Ambulatory Visit (HOSPITAL_COMMUNITY): Payer: Self-pay

## 2022-06-05 ENCOUNTER — Other Ambulatory Visit (HOSPITAL_COMMUNITY): Payer: Self-pay

## 2022-06-15 ENCOUNTER — Telehealth: Payer: Self-pay | Admitting: Internal Medicine

## 2022-06-15 NOTE — Telephone Encounter (Signed)
Called patient per 4/15 vm. Left voicemail for patient to contact us again with what she wanted to do with her appointments this week.

## 2022-06-15 NOTE — Telephone Encounter (Signed)
Patient left voicemail to move upcoming appointments to the afternoon, left voicemail.

## 2022-06-17 ENCOUNTER — Ambulatory Visit (HOSPITAL_COMMUNITY)
Admission: RE | Admit: 2022-06-17 | Discharge: 2022-06-17 | Disposition: A | Discharge status: Home / Self Care | Payer: Managed Care, Other (non HMO) | Source: Ambulatory Visit | Attending: Physician Assistant | Admitting: Physician Assistant

## 2022-06-17 ENCOUNTER — Telehealth: Payer: Self-pay | Admitting: Internal Medicine

## 2022-06-17 DIAGNOSIS — C3492 Malignant neoplasm of unspecified part of left bronchus or lung: Secondary | ICD-10-CM | POA: Insufficient documentation

## 2022-06-18 ENCOUNTER — Inpatient Hospital Stay: Payer: Managed Care, Other (non HMO)

## 2022-06-18 ENCOUNTER — Other Ambulatory Visit: Payer: Self-pay

## 2022-06-18 ENCOUNTER — Inpatient Hospital Stay: Payer: Managed Care, Other (non HMO) | Admitting: Internal Medicine

## 2022-06-18 ENCOUNTER — Inpatient Hospital Stay: Payer: Managed Care, Other (non HMO) | Attending: Physician Assistant | Admitting: Internal Medicine

## 2022-06-18 VITALS — BP 130/81 | HR 86 | Temp 98.4°F | Resp 16 | Wt 177.9 lb

## 2022-06-18 DIAGNOSIS — R0602 Shortness of breath: Secondary | ICD-10-CM | POA: Insufficient documentation

## 2022-06-18 DIAGNOSIS — C3492 Malignant neoplasm of unspecified part of left bronchus or lung: Secondary | ICD-10-CM

## 2022-06-18 DIAGNOSIS — Z9981 Dependence on supplemental oxygen: Secondary | ICD-10-CM | POA: Diagnosis not present

## 2022-06-18 DIAGNOSIS — N289 Disorder of kidney and ureter, unspecified: Secondary | ICD-10-CM | POA: Insufficient documentation

## 2022-06-18 DIAGNOSIS — R197 Diarrhea, unspecified: Secondary | ICD-10-CM | POA: Insufficient documentation

## 2022-06-18 DIAGNOSIS — Z95828 Presence of other vascular implants and grafts: Secondary | ICD-10-CM

## 2022-06-18 LAB — CMP (CANCER CENTER ONLY)
ALT: 19 U/L (ref 0–44)
AST: 17 U/L (ref 15–41)
Albumin: 4 g/dL (ref 3.5–5.0)
Alkaline Phosphatase: 84 U/L (ref 38–126)
Anion gap: 6 (ref 5–15)
BUN: 24 mg/dL — ABNORMAL HIGH (ref 8–23)
CO2: 28 mmol/L (ref 22–32)
Calcium: 9 mg/dL (ref 8.9–10.3)
Chloride: 108 mmol/L (ref 98–111)
Creatinine: 1.47 mg/dL — ABNORMAL HIGH (ref 0.44–1.00)
GFR, Estimated: 39 mL/min — ABNORMAL LOW (ref >60)
Glucose, Bld: 152 mg/dL — ABNORMAL HIGH (ref 70–99)
Potassium: 4.2 mmol/L (ref 3.5–5.1)
Sodium: 142 mmol/L (ref 135–145)
Total Bilirubin: 0.4 mg/dL (ref 0.3–1.2)
Total Protein: 6.7 g/dL (ref 6.5–8.1)

## 2022-06-18 LAB — CBC WITH DIFFERENTIAL (CANCER CENTER ONLY)
Abs Immature Granulocytes: 0.05 10*3/uL (ref 0.00–0.07)
Basophils Absolute: 0.1 10*3/uL (ref 0.0–0.1)
Basophils Relative: 1 %
Eosinophils Absolute: 0.3 10*3/uL (ref 0.0–0.5)
Eosinophils Relative: 4 %
HCT: 37.3 % (ref 36.0–46.0)
Hemoglobin: 12 g/dL (ref 12.0–15.0)
Immature Granulocytes: 1 %
Lymphocytes Relative: 21 %
Lymphs Abs: 1.4 10*3/uL (ref 0.7–4.0)
MCH: 29.8 pg (ref 26.0–34.0)
MCHC: 32.2 g/dL (ref 30.0–36.0)
MCV: 92.6 fL (ref 80.0–100.0)
Monocytes Absolute: 0.8 10*3/uL (ref 0.1–1.0)
Monocytes Relative: 12 %
Neutro Abs: 4.2 10*3/uL (ref 1.7–7.7)
Neutrophils Relative %: 61 %
Platelet Count: 230 10*3/uL (ref 150–400)
RBC: 4.03 MIL/uL (ref 3.87–5.11)
RDW: 14.4 % (ref 11.5–15.5)
WBC Count: 6.7 10*3/uL (ref 4.0–10.5)
nRBC: 0 % (ref 0.0–0.2)

## 2022-06-18 MED ORDER — HEPARIN SOD (PORK) LOCK FLUSH 100 UNIT/ML IV SOLN
500.0000 [IU] | Freq: Once | INTRAVENOUS | Status: AC
  Administered 2022-06-18: 500 [IU]

## 2022-06-18 MED ORDER — SODIUM CHLORIDE 0.9% FLUSH
10.0000 mL | Freq: Once | INTRAVENOUS | Status: AC
  Administered 2022-06-18: 10 mL

## 2022-06-18 NOTE — Progress Notes (Signed)
[facility] [phone]   [phone]  OFFICE PROGRESS NOTE  [name], NP [address] [place] [address]  DIAGNOSIS:  Stage IV (T1a, N0, M1 a) non-small cell lung cancer, adenocarcinoma presented with multifocal disease in both lungs including 3 nodules in the left lung as well as 2 nodules in the right lung diagnosed in December 2019.      Molecular studies showed: KRAS G12C BCOR N1425S-subclonal RAD21 amplification   PDL 1 expression 1%.   PRIOR THERAPY: 1) Systemic chemotherapy with carboplatin for AUC of 5, Alimta 500 mg/M2 and Keytruda 200 mg IV every 3 weeks.  First dose April 12, 2018. Status post 4 cycles. starting from cycle #3, Keytruda was dropped and patient received treatment with Carboplatin for an AUC of 5 and Alimta 500 mg/m2 IV every 3 weeks.  2) Systemic chemotherapy with Alimta 500 mg/m2 IV every 3 weeks. Status post 3 cycles of single agent Alimta. Most recent dose given on 08/23/2018.  The treatment is currently on hold secondary to renal insufficiency   CURRENT THERAPY: Krazati 600 mg BID, first dose on 02/20/22. Dose reduced to 400 mg BID starting on 03/10/22. However, the patient states this was on hold since she was in the hospital on 03/15/22 and she had not resumed it until 05/07/22.   INTERVAL HISTORY: [name] 67 y.o. female returns to the clinic today for follow-up visit.  The patient continues to have the baseline shortness of breath and she is currently on home oxygen.  She denied having any current chest pain, cough or hemoptysis.  She continues to have pain at the lower rib cage bilaterally.  She has no nausea, vomiting, diarrhea or constipation.  She has no headache or visual changes.  She denied having any fever or chills.  She is here today for evaluation with repeat CT scan of the chest, abdomen and pelvis for restaging of her disease.  She has been tolerating her treatment with [name]  (Adagrasib) fairly well except for few episodes of diarrhea.  MEDICAL HISTORY: Past Medical History:  Diagnosis Date   Anemia    "long time ago" (12/06/2012)   Anxiety    Arthritis    "right knee real bad; in my hands bad" (12/06/2012)   Asthma    Celiac disease    Colon polyps    COPD (chronic obstructive pulmonary disease) (HCC)    Coronary artery disease    a. s/p Xience DES to [facility] 04/2008;  b. LHC 05/2008: Proximal RCA 25%, mid RCA stent patent.  c. Anormal nuc 2014 -> s/p LHC with severe mRCA stenosis s/p DES. d. Cath 04/2013 s/p DES to LAD.  e. LHC 08/2015 was stable.   Depression    "years ago" (12/06/2012)   Fibromyalgia    Gallstones    GERD (gastroesophageal reflux disease)    H/O hiatal hernia    Hepatitis    "not A, B, or C" (12/06/2012)   Hyperlipidemia    intol to statins and other chol agents due to elevated LFTs   Hypertension    Hypothyroidism    IBS (irritable bowel syndrome)    Interstitial cystitis    Lung cancer (HCC) dx'd 01/2018   Lung nodules    Bilateral   Migraines    "when I was younger" (12/06/2012)   Pneumonia    PONV (postoperative nausea and vomiting)    "Very bad"   Premature atrial contractions  PVC's (premature ventricular contractions)    Sinus headache    SVT (supraventricular tachycardia)     ALLERGIES:  is allergic to ciprofloxacin, levofloxacin, statins, tricor [fenofibrate], compazine [prochlorperazine], latex, codeine, and metoprolol.  MEDICATIONS:  Current Outpatient Medications  Medication Sig Dispense Refill   adagrasib (KRAZATI) 200 MG tablet Take 2 tablets (400 mg total) by mouth 2 (two) times daily. 180 tablet 3   albuterol (PROVENTIL) (5 MG/ML) 0.5% nebulizer solution Take 2.5 mg by nebulization every 6 (six) hours as needed for wheezing.     albuterol (VENTOLIN HFA) 108 (90 Base) MCG/ACT inhaler Inhale 1 puff into the lungs every 6 (six) hours as needed (wheezing/shortness of breath.).      aspirin 81 MG chewable tablet  Chew 1 tablet (81 mg total) by mouth daily.     benzonatate (TESSALON) 200 MG capsule Take 1 capsule (200 mg total) by mouth 3 (three) times daily as needed for cough. 20 capsule 0   Budeson-Glycopyrrol-Formoterol (BREZTRI AEROSPHERE) 160-9-4.8 MCG/ACT AERO Inhale 2 puffs into the lungs in the morning and at bedtime. 10.7 g 5   buPROPion (WELLBUTRIN XL) 150 MG 24 hr tablet Take 1-2 tablets by mouth 2 (two) times daily. Patient takes 2 tablets ( ) in the morning and 1 tablet ( ) in the evening     cyanocobalamin 1000 MCG tablet Take 1 tablet (1,000 mcg total) by mouth daily. 100 tablet 0   dicyclomine (BENTYL) 10 MG capsule Take 1 capsule (10 mg total) by mouth every 8 (eight) hours as needed for spasms. 20 capsule 0   ferrous sulfate 325 (65 FE) MG EC tablet Take 1 tablet (325 mg total) by mouth daily with breakfast. 30 tablet 1   gabapentin (NEURONTIN) 100 MG capsule Take 1 capsule (100 mg total) by mouth 3 (three) times daily. 90 capsule 2   HYDROcodone bit-homatropine (HYCODAN) 5-1.5 MG/5ML syrup Take 5 mLs by mouth every 6 (six) hours as needed for cough. 120 mL 0   HYDROcodone-acetaminophen (NORCO/VICODIN) 5-325 MG tablet Take 1 tablet by mouth 4 (four) times daily as needed (pain).     ipratropium-albuterol (DUONEB) 0.5-2.5 (3) MG/3ML SOLN Inhale 3 mLs into the lungs every 6 (six) hours as needed (Asthma). 360 mL 3   loperamide (IMODIUM A-D) 2 MG tablet Take 1 tablet (2 mg total) by mouth 4 (four) times daily as needed for diarrhea or loose stools. 20 tablet 0   LORazepam (ATIVAN) 2 MG tablet Take 2 mg by mouth every evening.     metFORMIN (GLUCOPHAGE-XR) 500 MG 24 hr tablet Take 1 tablet (500 mg total) by mouth daily with breakfast. 30 tablet 2   methylPREDNISolone (MEDROL DOSEPAK) 4 MG TBPK tablet Use as instructed 21 tablet 0   nitroGLYCERIN (NITROSTAT) 0.4 MG SL tablet Place 1 tablet (0.4 mg total) under the tongue every 5 (five) minutes as needed for chest pain. 25 tablet 12    ondansetron (ZOFRAN-ODT) 4 MG disintegrating tablet Take 1 tablet (4 mg total) by mouth every 8 (eight) hours as needed for nausea or vomiting. 30 tablet 2   pantoprazole (PROTONIX) 40 MG tablet Take 1 tablet (40 mg total) by mouth daily. 30 tablet 0   Vitamin D, Ergocalciferol, (DRISDOL) 1.25 MG (50000 UT) CAPS capsule Take 50,000 Units by mouth every 7 (seven) days. Wednesdays     No current facility-administered medications for this visit.    SURGICAL HISTORY:  Past Surgical History:  Procedure Laterality Date   CARDIAC CATHETERIZATION  04/2008; 05/2008  CARDIAC CATHETERIZATION N/A 08/07/2015   Procedure: Left Heart Cath and Coronary Angiography;  Surgeon: Tonny Bollman, MD;  Location: [facility] INVASIVE CV LAB;  Service: Cardiovascular;  Laterality: N/A;   CHOLECYSTECTOMY     CORONARY ANGIOPLASTY WITH STENT PLACEMENT  04/2008 12/06/2012   "1 + 1" (12/06/2012)   CORONARY STENT PLACEMENT  05/08/2013   DES TO LAD      DR MCALHANY   IR IMAGING GUIDED PORT INSERTION  04/29/2018   LEFT HEART CATHETERIZATION WITH CORONARY ANGIOGRAM N/A 12/06/2012   Procedure: LEFT HEART CATHETERIZATION WITH CORONARY ANGIOGRAM;  Surgeon: Kathleene Hazel, MD;  Location: [facility] CATH LAB;  Service: Cardiovascular;  Laterality: N/A;   LEFT HEART CATHETERIZATION WITH CORONARY ANGIOGRAM N/A 05/08/2013   Procedure: LEFT HEART CATHETERIZATION WITH CORONARY ANGIOGRAM;  Surgeon: Kathleene Hazel, MD;  Location: [facility] CATH LAB;  Service: Cardiovascular;  Laterality: N/A;   LEFT HEART CATHETERIZATION WITH CORONARY ANGIOGRAM N/A 01/04/2014   Procedure: LEFT HEART CATHETERIZATION WITH CORONARY ANGIOGRAM;  Surgeon: Kathleene Hazel, MD;  Location: [facility] CATH LAB;  Service: Cardiovascular;  Laterality: N/A;   PERCUTANEOUS CORONARY STENT INTERVENTION (PCI-S)  12/06/2012   Procedure: PERCUTANEOUS CORONARY STENT INTERVENTION (PCI-S);  Surgeon: Kathleene Hazel, MD;  Location: [facility] CATH LAB;  Service: Cardiovascular;;   PERCUTANEOUS  CORONARY STENT INTERVENTION (PCI-S)  05/08/2013   Procedure: PERCUTANEOUS CORONARY STENT INTERVENTION (PCI-S);  Surgeon: Kathleene Hazel, MD;  Location: [facility] CATH LAB;  Service: Cardiovascular;;  mid LAD    TUBAL LIGATION     VAGINAL HYSTERECTOMY     VIDEO BRONCHOSCOPY WITH ENDOBRONCHIAL NAVIGATION N/A 02/17/2018   Procedure: VIDEO BRONCHOSCOPY WITH ENDOBRONCHIAL NAVIGATION;  Surgeon: Loreli Slot, MD;  Location: [facility] OR;  Service: Thoracic;  Laterality: N/A;    REVIEW OF SYSTEMS:  Constitutional: positive for fatigue Eyes: negative Ears, nose, mouth, throat, and face: negative Respiratory: positive for cough and dyspnea on exertion Cardiovascular: negative Gastrointestinal: positive for diarrhea Genitourinary:negative Integument/breast: negative Hematologic/lymphatic: negative Musculoskeletal:negative Neurological: negative Behavioral/Psych: negative Endocrine: negative Allergic/Immunologic: negative   PHYSICAL EXAMINATION: General appearance: alert, cooperative, fatigued, and no distress Head: Normocephalic, without obvious abnormality, atraumatic Neck: no adenopathy, no JVD, supple, symmetrical, trachea midline, and thyroid not enlarged, symmetric, no tenderness/mass/nodules Lymph nodes: Cervical, supraclavicular, and axillary nodes normal. Resp: clear to auscultation bilaterally Back: symmetric, no curvature. ROM normal. No CVA tenderness. Cardio: regular rate and rhythm, S1, S2 normal, no murmur, click, rub or gallop GI: soft, non-tender; bowel sounds normal; no masses,  no organomegaly Extremities: extremities normal, atraumatic, no cyanosis or edema Neurologic: Alert and oriented X 3, normal strength and tone. Normal symmetric reflexes. Normal coordination and gait  ECOG PERFORMANCE STATUS: 1 - Symptomatic but completely ambulatory  Blood pressure 130/81, pulse 86, temperature 98.4 F (36.9 C), temperature source Oral, resp. rate 16, weight 177 lb 14.4 oz (80.7  kg), SpO2 95 %.  LABORATORY DATA: Lab Results  Component Value Date   WBC 6.7 06/18/2022   HGB 12.0 06/18/2022   HCT 37.3 06/18/2022   MCV 92.6 06/18/2022   PLT 230 06/18/2022      Chemistry      Component Value Date/Time   NA 141 05/28/2022 0853   NA 143 01/10/2018 1629   K 4.6 05/28/2022 0853   CL 105 05/28/2022 0853   CO2 27 05/28/2022 0853   BUN 36 (H) 05/28/2022 0853   BUN 14 01/10/2018 1629   CREATININE 1.62 (H) 05/28/2022 0853   CREATININE 0.68 08/05/2015 1456  Component Value Date/Time   CALCIUM 9.3 05/28/2022 0853   ALKPHOS 83 05/28/2022 0853   AST 30 05/28/2022 0853   ALT 58 (H) 05/28/2022 0853   BILITOT 0.7 05/28/2022 0853       RADIOGRAPHIC STUDIES: No results found.  ASSESSMENT AND PLAN: This is a very pleasant 67 years old white female with a stage IV non-small cell lung cancer, adenocarcinoma that was diagnosed in December 2019 with positive KRAS G12C mutation and PD-L1 expression of 1%. The patient started systemic chemotherapy initially with carboplatin, Alimta and Keytruda for 4 cycles but Keytruda was discontinued starting from cycle #3 because of toxicity issues. The patient was then on observation and she had evidence for disease progression and resumed her treatment with single agent Alimta for 3 cycles but this was on hold since August 23, 2018 secondary to renal insufficiency. She had evidence for disease progression few months ago and the patient started treatment with [name] (Adagrasib) 600 mg p.o. daily on February 20, 2022 but her dose was dropped to 400 mg p.o. twice daily on March 10, 2022. She was in the hospital and her treatment has been on hold from March 15, 2022 until May 07, 2022 The patient has been tolerating her treatment with [name] (Adagrasib) at a reduced dose of 400 mg p.o. twice daily since March 2024 fairly well with no concerning adverse effect except for occasional diarrhea. She had repeat CT scan of the chest, abdomen  and pelvis performed yesterday.  Unfortunately the scan report is still pending but I personally and independently reviewed the images and I see some improvement in the bilateral pulmonary nodules on the current treatment.  I will wait for the final report of the scan for confirmation. I recommended for her to continue her current treatment with [name] (Adagrasib) with the same dose. I will see her back for follow-up visit in 1 months for evaluation and repeat blood work. For the diarrhea she was advised to take Imodium on as-needed basis. The patient was advised to call immediately if she has any other concerning symptoms in the interval. The patient voices understanding of current disease status and treatment options and is in agreement with the current care plan.  All questions were answered. The patient knows to call the clinic with any problems, questions or concerns. We can certainly see the patient much sooner if necessary.  The total time spent in the appointment was 30 minutes.  Disclaimer: This note was dictated with voice recognition software. Similar sounding words can inadvertently be transcribed and may not be corrected upon review.

## 2022-06-22 ENCOUNTER — Other Ambulatory Visit: Payer: Self-pay | Admitting: Internal Medicine

## 2022-06-22 ENCOUNTER — Telehealth: Payer: Self-pay | Admitting: Internal Medicine

## 2022-06-22 DIAGNOSIS — C3492 Malignant neoplasm of unspecified part of left bronchus or lung: Secondary | ICD-10-CM

## 2022-06-22 NOTE — Telephone Encounter (Signed)
Scheduled per 04/22 in-basket message, patient has been called and notified. 

## 2022-06-23 ENCOUNTER — Other Ambulatory Visit (HOSPITAL_COMMUNITY): Payer: Self-pay

## 2022-06-26 ENCOUNTER — Other Ambulatory Visit (HOSPITAL_COMMUNITY): Payer: Self-pay

## 2022-07-01 ENCOUNTER — Other Ambulatory Visit (HOSPITAL_COMMUNITY): Payer: Self-pay

## 2022-07-02 ENCOUNTER — Telehealth: Payer: Self-pay | Admitting: Medical Oncology

## 2022-07-02 ENCOUNTER — Other Ambulatory Visit: Payer: Self-pay | Admitting: Physician Assistant

## 2022-07-02 ENCOUNTER — Other Ambulatory Visit (HOSPITAL_COMMUNITY): Payer: Self-pay

## 2022-07-02 ENCOUNTER — Other Ambulatory Visit: Payer: Self-pay | Admitting: Medical Oncology

## 2022-07-02 NOTE — Telephone Encounter (Signed)
LVM that we got her message about hair loss.  Since hair loss is not listed as a side effect of [name] she should notify her PCP -there may be something the provider can f/u with .

## 2022-07-10 ENCOUNTER — Telehealth: Payer: Self-pay | Admitting: Internal Medicine

## 2022-07-20 ENCOUNTER — Other Ambulatory Visit: Payer: Self-pay

## 2022-07-20 ENCOUNTER — Inpatient Hospital Stay (HOSPITAL_BASED_OUTPATIENT_CLINIC_OR_DEPARTMENT_OTHER): Payer: Managed Care, Other (non HMO) | Admitting: Internal Medicine

## 2022-07-20 ENCOUNTER — Inpatient Hospital Stay: Payer: Managed Care, Other (non HMO) | Attending: Physician Assistant

## 2022-07-20 VITALS — BP 139/84 | HR 69 | Temp 98.5°F | Resp 17

## 2022-07-20 DIAGNOSIS — C3492 Malignant neoplasm of unspecified part of left bronchus or lung: Secondary | ICD-10-CM | POA: Insufficient documentation

## 2022-07-20 DIAGNOSIS — N289 Disorder of kidney and ureter, unspecified: Secondary | ICD-10-CM | POA: Diagnosis not present

## 2022-07-20 DIAGNOSIS — C3491 Malignant neoplasm of unspecified part of right bronchus or lung: Secondary | ICD-10-CM | POA: Insufficient documentation

## 2022-07-20 DIAGNOSIS — Z95828 Presence of other vascular implants and grafts: Secondary | ICD-10-CM

## 2022-07-20 LAB — CMP (CANCER CENTER ONLY)
ALT: 39 U/L (ref 0–44)
AST: 39 U/L (ref 15–41)
Albumin: 4.1 g/dL (ref 3.5–5.0)
Alkaline Phosphatase: 79 U/L (ref 38–126)
Anion gap: 6 (ref 5–15)
BUN: 27 mg/dL — ABNORMAL HIGH (ref 8–23)
CO2: 28 mmol/L (ref 22–32)
Calcium: 9 mg/dL (ref 8.9–10.3)
Chloride: 108 mmol/L (ref 98–111)
Creatinine: 1.48 mg/dL — ABNORMAL HIGH (ref 0.44–1.00)
GFR, Estimated: 39 mL/min — ABNORMAL LOW (ref >60)
Glucose, Bld: 98 mg/dL (ref 70–99)
Potassium: 4.4 mmol/L (ref 3.5–5.1)
Sodium: 142 mmol/L (ref 135–145)
Total Bilirubin: 0.5 mg/dL (ref 0.3–1.2)
Total Protein: 6.6 g/dL (ref 6.5–8.1)

## 2022-07-20 LAB — CBC WITH DIFFERENTIAL (CANCER CENTER ONLY)
Abs Immature Granulocytes: 0.05 10*3/uL (ref 0.00–0.07)
Basophils Absolute: 0.1 10*3/uL (ref 0.0–0.1)
Basophils Relative: 1 %
Eosinophils Absolute: 0.1 10*3/uL (ref 0.0–0.5)
Eosinophils Relative: 1 %
HCT: 38 % (ref 36.0–46.0)
Hemoglobin: 12.4 g/dL (ref 12.0–15.0)
Immature Granulocytes: 1 %
Lymphocytes Relative: 13 %
Lymphs Abs: 1.1 10*3/uL (ref 0.7–4.0)
MCH: 29.6 pg (ref 26.0–34.0)
MCHC: 32.6 g/dL (ref 30.0–36.0)
MCV: 90.7 fL (ref 80.0–100.0)
Monocytes Absolute: 1 10*3/uL (ref 0.1–1.0)
Monocytes Relative: 12 %
Neutro Abs: 5.8 10*3/uL (ref 1.7–7.7)
Neutrophils Relative %: 72 %
Platelet Count: 211 10*3/uL (ref 150–400)
RBC: 4.19 MIL/uL (ref 3.87–5.11)
RDW: 14 % (ref 11.5–15.5)
WBC Count: 8 10*3/uL (ref 4.0–10.5)
nRBC: 0 % (ref 0.0–0.2)

## 2022-07-20 MED ORDER — HEPARIN SOD (PORK) LOCK FLUSH 100 UNIT/ML IV SOLN
500.0000 [IU] | Freq: Once | INTRAVENOUS | Status: AC
  Administered 2022-07-20: 500 [IU]

## 2022-07-20 MED ORDER — SODIUM CHLORIDE 0.9% FLUSH
10.0000 mL | Freq: Once | INTRAVENOUS | Status: AC
  Administered 2022-07-20: 10 mL

## 2022-07-20 NOTE — Addendum Note (Signed)
Addended by: Charma Igo on: 07/20/2022 03:07 PM   Modules accepted: Orders

## 2022-07-20 NOTE — Progress Notes (Signed)
[facility] [phone]   [phone]  OFFICE PROGRESS NOTE  [name], NP [address] [place] [address]  DIAGNOSIS:  Stage IV (T1a, N0, M1 a) non-small cell lung cancer, adenocarcinoma presented with multifocal disease in both lungs including 3 nodules in the left lung as well as 2 nodules in the right lung diagnosed in December 2019.      Molecular studies showed: KRAS G12C BCOR N1425S-subclonal RAD21 amplification   PDL 1 expression 1%.   PRIOR THERAPY: 1) Systemic chemotherapy with carboplatin for AUC of 5, Alimta 500 mg/M2 and Keytruda 200 mg IV every 3 weeks.  First dose April 12, 2018. Status post 4 cycles. starting from cycle #3, Keytruda was dropped and patient received treatment with Carboplatin for an AUC of 5 and Alimta 500 mg/m2 IV every 3 weeks.  2) Systemic chemotherapy with Alimta 500 mg/m2 IV every 3 weeks. Status post 3 cycles of single agent Alimta. Most recent dose given on 08/23/2018.  The treatment is currently on hold secondary to renal insufficiency   CURRENT THERAPY: Krazati 600 mg BID, first dose on 02/20/22. Dose reduced to 400 mg BID starting on 03/10/22. However, the patient states this was on hold since she was in the hospital on 03/15/22 and she had not resumed it until 05/07/22.   INTERVAL HISTORY: [name] 67 y.o. female admitted to the clinic today for follow-up visit.  The patient continues to complain of the baseline fatigue and weakness as well as shortness of breath at baseline increased with exertion and she is currently on home oxygen.  She has no fever or chills.  She had mild nausea earlier today after she took her treatment with [name] (Adagrasib) and she did not take her antiemetics.  Her diarrhea is better and actually has more constipation these days.  She denied having any significant weight loss or night sweats.  She continues to tolerate [name] [employer]) fairly well.  She is  here today for evaluation with repeat blood work.  MEDICAL HISTORY: Past Medical History:  Diagnosis Date   Anemia    "long time ago" (12/06/2012)   Anxiety    Arthritis    "right knee real bad; in my hands bad" (12/06/2012)   Asthma    Celiac disease    Colon polyps    COPD (chronic obstructive pulmonary disease) (HCC)    Coronary artery disease    a. s/p Xience DES to [facility] 04/2008;  b. LHC 05/2008: Proximal RCA 25%, mid RCA stent patent.  c. Anormal nuc 2014 -> s/p LHC with severe mRCA stenosis s/p DES. d. Cath 04/2013 s/p DES to LAD.  e. LHC 08/2015 was stable.   Depression    "years ago" (12/06/2012)   Fibromyalgia    Gallstones    GERD (gastroesophageal reflux disease)    H/O hiatal hernia    Hepatitis    "not A, B, or C" (12/06/2012)   Hyperlipidemia    intol to statins and other chol agents due to elevated LFTs   Hypertension    Hypothyroidism    IBS (irritable bowel syndrome)    Interstitial cystitis    Lung cancer (HCC) dx'd 01/2018   Lung nodules    Bilateral   Migraines    "when I was younger" (12/06/2012)   Pneumonia    PONV (postoperative nausea and vomiting)    "Very bad"   Premature atrial contractions    PVC's (premature ventricular contractions)  Sinus headache    SVT (supraventricular tachycardia)     ALLERGIES:  is allergic to ciprofloxacin, levofloxacin, statins, tricor [fenofibrate], compazine [prochlorperazine], latex, codeine, and metoprolol.  MEDICATIONS:  Current Outpatient Medications  Medication Sig Dispense Refill   adagrasib (KRAZATI) 200 MG tablet Take 2 tablets (400 mg total) by mouth 2 (two) times daily. 180 tablet 3   albuterol (PROVENTIL) (5 MG/ML) 0.5% nebulizer solution Take 2.5 mg by nebulization every 6 (six) hours as needed for wheezing.     albuterol (VENTOLIN HFA) 108 (90 Base) MCG/ACT inhaler Inhale 1 puff into the lungs every 6 (six) hours as needed (wheezing/shortness of breath.).      aspirin 81 MG chewable tablet Chew 1 tablet  (81 mg total) by mouth daily.     benzonatate (TESSALON) 200 MG capsule Take 1 capsule (200 mg total) by mouth 3 (three) times daily as needed for cough. 20 capsule 0   Budeson-Glycopyrrol-Formoterol (BREZTRI AEROSPHERE) 160-9-4.8 MCG/ACT AERO Inhale 2 puffs into the lungs in the morning and at bedtime. 10.7 g 5   buPROPion (WELLBUTRIN XL) 150 MG 24 hr tablet Take 1-2 tablets by mouth 2 (two) times daily. Patient takes 2 tablets (300mg ) in the morning and 1 tablet (150mg ) in the evening     dicyclomine (BENTYL) 10 MG capsule Take 1 capsule (10 mg total) by mouth every 8 (eight) hours as needed for spasms. 20 capsule 0   DULoxetine (CYMBALTA) 60 MG capsule Take 120 mg by mouth daily.     ferrous sulfate (FEROSUL) 325 (65 FE) MG tablet TAKE 1 TABLET(325 MG) BY MOUTH DAILY WITH BREAKFAST 30 tablet 2   gabapentin (NEURONTIN) 100 MG capsule Take 1 capsule (100 mg total) by mouth 3 (three) times daily. 90 capsule 2   HYDROcodone bit-homatropine (HYCODAN) 5-1.5 MG/5ML syrup Take 5 mLs by mouth every 6 (six) hours as needed for cough. 120 mL 0   HYDROcodone-acetaminophen (NORCO/VICODIN) 5-325 MG tablet Take 1 tablet by mouth 4 (four) times daily as needed (pain).     ipratropium-albuterol (DUONEB) 0.5-2.5 (3) MG/3ML SOLN Inhale 3 mLs into the lungs every 6 (six) hours as needed (Asthma). 360 mL 3   loperamide (IMODIUM A-D) 2 MG tablet Take 1 tablet (2 mg total) by mouth 4 (four) times daily as needed for diarrhea or loose stools. 20 tablet 0   LORazepam (ATIVAN) 2 MG tablet Take 2 mg by mouth every evening.     metFORMIN (GLUCOPHAGE-XR) 500 MG 24 hr tablet Take 1 tablet (500 mg total) by mouth daily with breakfast. 30 tablet 2   methylPREDNISolone (MEDROL DOSEPAK) 4 MG TBPK tablet Use as instructed 21 tablet 0   nitroGLYCERIN (NITROSTAT) 0.4 MG SL tablet Place 1 tablet (0.4 mg total) under the tongue every 5 (five) minutes as needed for chest pain. 25 tablet 12   ondansetron (ZOFRAN-ODT) 4 MG disintegrating  tablet Take 1 tablet (4 mg total) by mouth every 8 (eight) hours as needed for nausea or vomiting. 30 tablet 2   pantoprazole (PROTONIX) 40 MG tablet Take 1 tablet (40 mg total) by mouth daily. 30 tablet 0   Vitamin D, Ergocalciferol, (DRISDOL) 1.25 MG (50000 UT) CAPS capsule Take 50,000 Units by mouth every 7 (seven) days. Wednesdays     No current facility-administered medications for this visit.    SURGICAL HISTORY:  Past Surgical History:  Procedure Laterality Date   CARDIAC CATHETERIZATION  04/2008; 05/2008   CARDIAC CATHETERIZATION N/A 08/07/2015   Procedure: Left Heart Cath and Coronary  Angiography;  Surgeon: Tonny Bollman, MD;  Location: [facility] INVASIVE CV LAB;  Service: Cardiovascular;  Laterality: N/A;   CHOLECYSTECTOMY     CORONARY ANGIOPLASTY WITH STENT PLACEMENT  04/2008 12/06/2012   "1 + 1" (12/06/2012)   CORONARY STENT PLACEMENT  05/08/2013   DES TO LAD      DR MCALHANY   IR IMAGING GUIDED PORT INSERTION  04/29/2018   LEFT HEART CATHETERIZATION WITH CORONARY ANGIOGRAM N/A 12/06/2012   Procedure: LEFT HEART CATHETERIZATION WITH CORONARY ANGIOGRAM;  Surgeon: Kathleene Hazel, MD;  Location[facility] CATH LAB;  Service: Cardiovascular;  Laterality: N/A;   LEFT HEART CATHETERIZATION WITH CORONARY ANGIOGRAM N/A 05/08/2013   Procedure: LEFT HEART CATHETERIZATION WITH CORONARY ANGIOGRAM;  Surgeon: Kathleene Hazel, MD;  Location: [facility] CATH LAB;  Service: Cardiovascular;  Laterality: N/A;   LEFT HEART CATHETERIZATION WITH CORONARY ANGIOGRAM N/A 01/04/2014   Procedure: LEFT HEART CATHETERIZATION WITH CORONARY ANGIOGRAM;  Surgeon: Kathleene Hazel, MD;  Location: [facility] CATH LAB;  Service: Cardiovascular;  Laterality: N/A;   PERCUTANEOUS CORONARY STENT INTERVENTION (PCI-S)  12/06/2012   Procedure: PERCUTANEOUS CORONARY STENT INTERVENTION (PCI-S);  Surgeon: Kathleene Hazel, MD;  Location: [facility] CATH LAB;  Service: Cardiovascular;;   PERCUTANEOUS CORONARY STENT INTERVENTION (PCI-S)  05/08/2013    Procedure: PERCUTANEOUS CORONARY STENT INTERVENTION (PCI-S);  Surgeon: Kathleene Hazel, MD;  Location: [facility] CATH LAB;  Service: Cardiovascular;;  mid LAD    TUBAL LIGATION     VAGINAL HYSTERECTOMY     VIDEO BRONCHOSCOPY WITH ENDOBRONCHIAL NAVIGATION N/A 02/17/2018   Procedure: VIDEO BRONCHOSCOPY WITH ENDOBRONCHIAL NAVIGATION;  Surgeon: Loreli Slot, MD;  Location: [facility] OR;  Service: Thoracic;  Laterality: N/A;    REVIEW OF SYSTEMS:  A comprehensive review of systems was negative except for: Constitutional: positive for fatigue Respiratory: positive for dyspnea on exertion Gastrointestinal: positive for nausea Musculoskeletal: positive for muscle weakness   PHYSICAL EXAMINATION: General appearance: alert, cooperative, fatigued, and no distress Head: Normocephalic, without obvious abnormality, atraumatic Neck: no adenopathy, no JVD, supple, symmetrical, trachea midline, and thyroid not enlarged, symmetric, no tenderness/mass/nodules Lymph nodes: Cervical, supraclavicular, and axillary nodes normal. Resp: clear to auscultation bilaterally Back: symmetric, no curvature. ROM normal. No CVA tenderness. Cardio: regular rate and rhythm, S1, S2 normal, no murmur, click, rub or gallop GI: soft, non-tender; bowel sounds normal; no masses,  no organomegaly Extremities: extremities normal, atraumatic, no cyanosis or edema  ECOG PERFORMANCE STATUS: 1 - Symptomatic but completely ambulatory  Blood pressure 139/84, pulse 69, temperature 98.5 F (36.9 C), temperature source Oral, resp. rate 17, SpO2 94 %.  LABORATORY DATA: Lab Results  Component Value Date   WBC 8.0 07/20/2022   HGB 12.4 07/20/2022   HCT 38.0 07/20/2022   MCV 90.7 07/20/2022   PLT 211 07/20/2022      Chemistry      Component Value Date/Time   NA 142 07/20/2022 0830   NA 143 01/10/2018 1629   K 4.4 07/20/2022 0830   CL 108 07/20/2022 0830   CO2 28 07/20/2022 0830   BUN 27 (H) 07/20/2022 0830   BUN 14  01/10/2018 1629   CREATININE 1.48 (H) 07/20/2022 0830   CREATININE 0.68 08/05/2015 1456      Component Value Date/Time   CALCIUM 9.0 07/20/2022 0830   ALKPHOS 79 07/20/2022 0830   AST 39 07/20/2022 0830   ALT 39 07/20/2022 0830   BILITOT 0.5 07/20/2022 0830       RADIOGRAPHIC STUDIES: No results found.  ASSESSMENT AND PLAN: This  is a very pleasant 66 years old white female with a stage IV non-small cell lung cancer, adenocarcinoma that was diagnosed in December 2019 with positive KRAS G12C mutation and PD-L1 expression of 1%. The patient started systemic chemotherapy initially with carboplatin, Alimta and Keytruda for 4 cycles but Keytruda was discontinued starting from cycle #3 because of toxicity issues. The patient was then on observation and she had evidence for disease progression and resumed her treatment with single agent Alimta for 3 cycles but this was on hold since August 23, 2018 secondary to renal insufficiency. She had evidence for disease progression few months ago and the patient started treatment with [name] (Adagrasib) 600 mg p.o. daily on February 20, 2022 but her dose was dropped to 400 mg p.o. twice daily on March 10, 2022. She was in the hospital and her treatment has been on hold from March 15, 2022 until May 07, 2022 The patient has been tolerating her treatment with [name] (Adagrasib) at a reduced dose of 400 mg p.o. twice daily since March 2024 fairly well with no concerning adverse effects except for mild nausea earlier today.  She has no more diarrhea. Repeat blood work is unremarkable. I recommended for her to continue her current treatment with Krazati (Adagrasib) 400 mg p.o. twice daily. I will see her back for follow-up visit in 1 months for evaluation and repeat blood work. The patient was advised to call immediately if she has any other concerning symptoms in the interval. The patient voices understanding of current disease status and treatment options and  is in agreement with the current care plan.  All questions were answered. The patient knows to call the clinic with any problems, questions or concerns. We can certainly see the patient much sooner if necessary.  The total time spent in the appointment was 20 minutes.  Disclaimer: This note was dictated with voice recognition software. Similar sounding words can inadvertently be transcribed and may not be corrected upon review.

## 2022-07-21 ENCOUNTER — Telehealth: Payer: Self-pay | Admitting: Internal Medicine

## 2022-07-21 NOTE — Telephone Encounter (Signed)
Scheduled per 05/20 los, patient has been called and notified of upcoming appointments. 

## 2022-07-23 ENCOUNTER — Other Ambulatory Visit (HOSPITAL_COMMUNITY): Payer: Self-pay

## 2022-07-28 ENCOUNTER — Telehealth: Payer: Self-pay | Admitting: Pharmacy Technician

## 2022-07-28 ENCOUNTER — Other Ambulatory Visit: Payer: Self-pay

## 2022-07-28 ENCOUNTER — Other Ambulatory Visit (HOSPITAL_COMMUNITY): Payer: Self-pay

## 2022-07-28 NOTE — Telephone Encounter (Signed)
Oral Oncology Patient Advocate Encounter  Prior Authorization for [name] has been approved.    PA# [id number] Effective dates: 07/28/22 through 01/24/23  Patients co-pay is $0.    Jinger Neighbors, CPhT-Adv Oncology Pharmacy Patient Advocate [facility] Direct Number: [phone]  Fax: [phone]

## 2022-07-28 NOTE — Telephone Encounter (Signed)
Oral Oncology Patient Advocate Encounter   Received notification that prior authorization for [name] is due for renewal.   PA submitted on 07/28/22 Key [id number] Status is pending     Jinger Neighbors, CPhT-Adv Oncology Pharmacy Patient Advocate [facility] Direct Number: [phone]  Fax: [phone]

## 2022-07-30 ENCOUNTER — Other Ambulatory Visit: Payer: Self-pay

## 2022-07-31 ENCOUNTER — Other Ambulatory Visit: Payer: Self-pay | Admitting: Physician Assistant

## 2022-07-31 DIAGNOSIS — C3492 Malignant neoplasm of unspecified part of left bronchus or lung: Secondary | ICD-10-CM

## 2022-08-06 ENCOUNTER — Other Ambulatory Visit (HOSPITAL_COMMUNITY): Payer: Self-pay

## 2022-08-11 ENCOUNTER — Other Ambulatory Visit: Payer: Self-pay

## 2022-08-11 ENCOUNTER — Other Ambulatory Visit (HOSPITAL_COMMUNITY): Payer: Self-pay

## 2022-08-21 ENCOUNTER — Other Ambulatory Visit: Payer: Self-pay

## 2022-08-21 ENCOUNTER — Other Ambulatory Visit (HOSPITAL_COMMUNITY): Payer: Self-pay

## 2022-08-24 ENCOUNTER — Inpatient Hospital Stay: Payer: Managed Care, Other (non HMO) | Admitting: Internal Medicine

## 2022-08-24 ENCOUNTER — Other Ambulatory Visit: Payer: Managed Care, Other (non HMO)

## 2022-08-24 ENCOUNTER — Inpatient Hospital Stay: Payer: Managed Care, Other (non HMO)

## 2022-08-24 ENCOUNTER — Telehealth: Payer: Self-pay | Admitting: Internal Medicine

## 2022-08-24 ENCOUNTER — Telehealth: Payer: Self-pay | Admitting: *Deleted

## 2022-08-24 ENCOUNTER — Ambulatory Visit: Payer: Managed Care, Other (non HMO) | Admitting: Internal Medicine

## 2022-08-24 NOTE — Telephone Encounter (Signed)
Patient had appt today at 1130. Notified by scheduling at [phone] that patient contacted them to r/s today's appt as they were not feeling well. Appts r/s for 7/8 Contacted patient, LVM: Received notice from scheduling that she did not feel well today. Encouraged her to contact  this office or her PCP as needed.

## 2022-09-07 ENCOUNTER — Inpatient Hospital Stay (HOSPITAL_BASED_OUTPATIENT_CLINIC_OR_DEPARTMENT_OTHER): Payer: Managed Care, Other (non HMO) | Admitting: Internal Medicine

## 2022-09-07 ENCOUNTER — Telehealth: Payer: Self-pay | Admitting: Internal Medicine

## 2022-09-07 ENCOUNTER — Inpatient Hospital Stay: Payer: Managed Care, Other (non HMO) | Attending: Physician Assistant

## 2022-09-07 VITALS — BP 116/68 | HR 85 | Temp 97.8°F | Resp 17 | Ht 67.0 in | Wt 187.0 lb

## 2022-09-07 DIAGNOSIS — C3492 Malignant neoplasm of unspecified part of left bronchus or lung: Secondary | ICD-10-CM | POA: Diagnosis present

## 2022-09-07 DIAGNOSIS — Z79899 Other long term (current) drug therapy: Secondary | ICD-10-CM | POA: Insufficient documentation

## 2022-09-07 DIAGNOSIS — C3490 Malignant neoplasm of unspecified part of unspecified bronchus or lung: Secondary | ICD-10-CM | POA: Diagnosis not present

## 2022-09-07 DIAGNOSIS — Z9981 Dependence on supplemental oxygen: Secondary | ICD-10-CM | POA: Insufficient documentation

## 2022-09-07 DIAGNOSIS — C3491 Malignant neoplasm of unspecified part of right bronchus or lung: Secondary | ICD-10-CM | POA: Diagnosis not present

## 2022-09-07 LAB — CBC WITH DIFFERENTIAL (CANCER CENTER ONLY)
Abs Immature Granulocytes: 0.05 10*3/uL (ref 0.00–0.07)
Basophils Absolute: 0.1 10*3/uL (ref 0.0–0.1)
Basophils Relative: 1 %
Eosinophils Absolute: 0.1 10*3/uL (ref 0.0–0.5)
Eosinophils Relative: 1 %
HCT: 37.8 % (ref 36.0–46.0)
Hemoglobin: 12.3 g/dL (ref 12.0–15.0)
Immature Granulocytes: 1 %
Lymphocytes Relative: 11 %
Lymphs Abs: 1 10*3/uL (ref 0.7–4.0)
MCH: 30.1 pg (ref 26.0–34.0)
MCHC: 32.5 g/dL (ref 30.0–36.0)
MCV: 92.4 fL (ref 80.0–100.0)
Monocytes Absolute: 0.6 10*3/uL (ref 0.1–1.0)
Monocytes Relative: 7 %
Neutro Abs: 7.4 10*3/uL (ref 1.7–7.7)
Neutrophils Relative %: 79 %
Platelet Count: 196 10*3/uL (ref 150–400)
RBC: 4.09 MIL/uL (ref 3.87–5.11)
RDW: 14.3 % (ref 11.5–15.5)
WBC Count: 9.2 10*3/uL (ref 4.0–10.5)
nRBC: 0 % (ref 0.0–0.2)

## 2022-09-07 LAB — CMP (CANCER CENTER ONLY)
ALT: 16 U/L (ref 0–44)
AST: 15 U/L (ref 15–41)
Albumin: 3.9 g/dL (ref 3.5–5.0)
Alkaline Phosphatase: 66 U/L (ref 38–126)
Anion gap: 6 (ref 5–15)
BUN: 31 mg/dL — ABNORMAL HIGH (ref 8–23)
CO2: 25 mmol/L (ref 22–32)
Calcium: 8.9 mg/dL (ref 8.9–10.3)
Chloride: 110 mmol/L (ref 98–111)
Creatinine: 1.46 mg/dL — ABNORMAL HIGH (ref 0.44–1.00)
GFR, Estimated: 39 mL/min — ABNORMAL LOW (ref >60)
Glucose, Bld: 136 mg/dL — ABNORMAL HIGH (ref 70–99)
Potassium: 4.3 mmol/L (ref 3.5–5.1)
Sodium: 141 mmol/L (ref 135–145)
Total Bilirubin: 0.4 mg/dL (ref 0.3–1.2)
Total Protein: 6.7 g/dL (ref 6.5–8.1)

## 2022-09-07 LAB — TSH: TSH: 7.028 u[IU]/mL — ABNORMAL HIGH (ref 0.350–4.500)

## 2022-09-07 NOTE — Progress Notes (Signed)
[facility] [phone]   [phone]  OFFICE PROGRESS NOTE  [name], NP [address] [place] [address]  DIAGNOSIS:  Stage IV (T1a, N0, M1 a) non-small cell lung cancer, adenocarcinoma presented with multifocal disease in both lungs including 3 nodules in the left lung as well as 2 nodules in the right lung diagnosed in December 2019.      Molecular studies showed: KRAS G12C BCOR [id number]-subclonal RAD21 amplification   PDL 1 expression 1%.   PRIOR THERAPY: 1) Systemic chemotherapy with carboplatin for AUC of 5, Alimta 500 mg/M2 and Keytruda 200 mg IV every 3 weeks.  First dose April 12, 2018. Status post 4 cycles. starting from cycle #3, Keytruda was dropped and patient received treatment with Carboplatin for an AUC of 5 and Alimta 500 mg/m2 IV every 3 weeks.  2) Systemic chemotherapy with Alimta 500 mg/m2 IV every 3 weeks. Status post 3 cycles of single agent Alimta. Most recent dose given on 08/23/2018.  The treatment is currently on hold secondary to renal insufficiency   CURRENT THERAPY: Krazati 600 mg BID, first dose on 02/20/22. Dose reduced to 400 mg BID starting on 03/10/22. However, the patient states this was on hold since she was in the hospital on 03/15/22 and she had not resumed it until 05/07/22.   INTERVAL HISTORY: [name] 67 y.o. female returns to the clinic today for follow-up.  The patient is feeling fine today with no concerning complaints except for the intermittent diarrhea and constipation.  She also has a baseline shortness of breath and she is currently on home oxygen.  She denied having any chest pain, cough or hemoptysis.  She has occasional nausea with no vomiting or abdominal pain.  She denied having any significant weight loss.  She is here today for evaluation and repeat blood work.  MEDICAL HISTORY: Past Medical History:  Diagnosis Date   Anemia    "long time ago" (12/06/2012)   Anxiety     Arthritis    "right knee real bad; in my hands bad" (12/06/2012)   Asthma    Celiac disease    Colon polyps    COPD (chronic obstructive pulmonary disease) (HCC)    Coronary artery disease    a. s/p Xience DES to [facility] 04/2008;  b. LHC 05/2008: Proximal RCA 25%, mid RCA stent patent.  c. Anormal nuc 2014 -> s/p LHC with severe mRCA stenosis s/p DES. d. Cath 04/2013 s/p DES to LAD.  e. LHC 08/2015 was stable.   Depression    "years ago" (12/06/2012)   Fibromyalgia    Gallstones    GERD (gastroesophageal reflux disease)    H/O hiatal hernia    Hepatitis    "not A, B, or C" (12/06/2012)   Hyperlipidemia    intol to statins and other chol agents due to elevated LFTs   Hypertension    Hypothyroidism    IBS (irritable bowel syndrome)    Interstitial cystitis    Lung cancer (HCC) dx'd 01/2018   Lung nodules    Bilateral   Migraines    "when I was younger" (12/06/2012)   Pneumonia    PONV (postoperative nausea and vomiting)    "Very bad"   Premature atrial contractions    PVC's (premature ventricular contractions)    Sinus headache    SVT (supraventricular tachycardia)     ALLERGIES:  is allergic to ciprofloxacin, levofloxacin, statins, tricor [fenofibrate], compazine [prochlorperazine], latex, codeine,  and metoprolol.  MEDICATIONS:  Current Outpatient Medications  Medication Sig Dispense Refill   adagrasib (KRAZATI) 200 MG tablet Take 2 tablets (400 mg total) by mouth 2 (two) times daily. 180 tablet 3   albuterol (PROVENTIL) (5 MG/ML) 0.5% nebulizer solution Take 2.5 mg by nebulization every 6 (six) hours as needed for wheezing.     albuterol (VENTOLIN HFA) 108 (90 Base) MCG/ACT inhaler Inhale 1 puff into the lungs every 6 (six) hours as needed (wheezing/shortness of breath.).      aspirin 81 MG chewable tablet Chew 1 tablet (81 mg total) by mouth daily.     Budeson-Glycopyrrol-Formoterol (BREZTRI AEROSPHERE) 160-9-4.8 MCG/ACT AERO Inhale 2 puffs into the lungs in the morning and at  bedtime. 10.7 g 5   buPROPion (WELLBUTRIN XL) 150 MG 24 hr tablet Take 1-2 tablets by mouth 2 (two) times daily. Patient takes 2 tablets (300mg ) in the morning and 1 tablet (150mg ) in the evening     ferrous sulfate (FEROSUL) 325 (65 FE) MG tablet TAKE 1 TABLET(325 MG) BY MOUTH DAILY WITH BREAKFAST 30 tablet 2   gabapentin (NEURONTIN) 100 MG capsule TAKE 1 CAPSULE(100 MG) BY MOUTH THREE TIMES DAILY. 90 capsule 2   HYDROcodone-acetaminophen (NORCO/VICODIN) 5-325 MG tablet Take 1 tablet by mouth 4 (four) times daily as needed (pain).     ipratropium-albuterol (DUONEB) 0.5-2.5 (3) MG/3ML SOLN Inhale 3 mLs into the lungs every 6 (six) hours as needed (Asthma). 360 mL 3   loperamide (IMODIUM A-D) 2 MG tablet Take 1 tablet (2 mg total) by mouth 4 (four) times daily as needed for diarrhea or loose stools. 20 tablet 0   LORazepam (ATIVAN) 2 MG tablet Take 2 mg by mouth every evening.     nitroGLYCERIN (NITROSTAT) 0.4 MG SL tablet Place 1 tablet (0.4 mg total) under the tongue every 5 (five) minutes as needed for chest pain. 25 tablet 12   ondansetron (ZOFRAN-ODT) 4 MG disintegrating tablet Take 1 tablet (4 mg total) by mouth every 8 (eight) hours as needed for nausea or vomiting. 30 tablet 2   No current facility-administered medications for this visit.    SURGICAL HISTORY:  Past Surgical History:  Procedure Laterality Date   CARDIAC CATHETERIZATION  04/2008; 05/2008   CARDIAC CATHETERIZATION N/A 08/07/2015   Procedure: Left Heart Cath and Coronary Angiography;  Surgeon: Tonny Bollman, MD;  Location: [facility] INVASIVE CV LAB;  Service: Cardiovascular;  Laterality: N/A;   CHOLECYSTECTOMY     CORONARY ANGIOPLASTY WITH STENT PLACEMENT  04/2008 12/06/2012   "1 + 1" (12/06/2012)   CORONARY STENT PLACEMENT  05/08/2013   DES TO LAD      DR MCALHANY   IR IMAGING GUIDED PORT INSERTION  04/29/2018   LEFT HEART CATHETERIZATION WITH CORONARY ANGIOGRAM N/A 12/06/2012   Procedure: LEFT HEART CATHETERIZATION WITH CORONARY  ANGIOGRAM;  Surgeon: Kathleene Hazel, MD;  Location: [facility] CATH LAB;  Service: Cardiovascular;  Laterality: N/A;   LEFT HEART CATHETERIZATION WITH CORONARY ANGIOGRAM N/A 05/08/2013   Procedure: LEFT HEART CATHETERIZATION WITH CORONARY ANGIOGRAM;  Surgeon: Kathleene Hazel, MD;  Location: [facility] CATH LAB;  Service: Cardiovascular;  Laterality: N/A;   LEFT HEART CATHETERIZATION WITH CORONARY ANGIOGRAM N/A 01/04/2014   Procedure: LEFT HEART CATHETERIZATION WITH CORONARY ANGIOGRAM;  Surgeon: Kathleene Hazel, MD;  Location: [facility] CATH LAB;  Service: Cardiovascular;  Laterality: N/A;   PERCUTANEOUS CORONARY STENT INTERVENTION (PCI-S)  12/06/2012   Procedure: PERCUTANEOUS CORONARY STENT INTERVENTION (PCI-S);  Surgeon: Kathleene Hazel, MD;  Location: [facility]  CATH LAB;  Service: Cardiovascular;;   PERCUTANEOUS CORONARY STENT INTERVENTION (PCI-S)  05/08/2013   Procedure: PERCUTANEOUS CORONARY STENT INTERVENTION (PCI-S);  Surgeon: Kathleene Hazel, MD;  Location: [facility] CATH LAB;  Service: Cardiovascular;;  mid LAD    TUBAL LIGATION     VAGINAL HYSTERECTOMY     VIDEO BRONCHOSCOPY WITH ENDOBRONCHIAL NAVIGATION N/A 02/17/2018   Procedure: VIDEO BRONCHOSCOPY WITH ENDOBRONCHIAL NAVIGATION;  Surgeon: Loreli Slot, MD;  Location: [facility] OR;  Service: Thoracic;  Laterality: N/A;    REVIEW OF SYSTEMS:  A comprehensive review of systems was negative except for: Constitutional: positive for fatigue Respiratory: positive for dyspnea on exertion Gastrointestinal: positive for diarrhea and nausea Musculoskeletal: positive for muscle weakness   PHYSICAL EXAMINATION: General appearance: alert, cooperative, fatigued, and no distress Head: Normocephalic, without obvious abnormality, atraumatic Neck: no adenopathy, no JVD, supple, symmetrical, trachea midline, and thyroid not enlarged, symmetric, no tenderness/mass/nodules Lymph nodes: Cervical, supraclavicular, and axillary nodes normal. Resp: clear to  auscultation bilaterally Back: symmetric, no curvature. ROM normal. No CVA tenderness. Cardio: regular rate and rhythm, S1, S2 normal, no murmur, click, rub or gallop GI: soft, non-tender; bowel sounds normal; no masses,  no organomegaly Extremities: extremities normal, atraumatic, no cyanosis or edema  ECOG PERFORMANCE STATUS: 1 - Symptomatic but completely ambulatory  Blood pressure 116/68, pulse 85, temperature 97.8 F (36.6 C), temperature source Oral, resp. rate 17, height 5\' 7"  (1.702 m), weight 187 lb (84.8 kg), SpO2 97 %.  LABORATORY DATA: Lab Results  Component Value Date   WBC 9.2 09/07/2022   HGB 12.3 09/07/2022   HCT 37.8 09/07/2022   MCV 92.4 09/07/2022   PLT 196 09/07/2022      Chemistry      Component Value Date/Time   NA 142 07/20/2022 0830   NA 143 01/10/2018 1629   K 4.4 07/20/2022 0830   CL 108 07/20/2022 0830   CO2 28 07/20/2022 0830   BUN 27 (H) 07/20/2022 0830   BUN 14 01/10/2018 1629   CREATININE 1.48 (H) 07/20/2022 0830   CREATININE 0.68 08/05/2015 1456      Component Value Date/Time   CALCIUM 9.0 07/20/2022 0830   ALKPHOS 79 07/20/2022 0830   AST 39 07/20/2022 0830   ALT 39 07/20/2022 0830   BILITOT 0.5 07/20/2022 0830       RADIOGRAPHIC STUDIES: No results found.  ASSESSMENT AND PLAN: This is a very pleasant 67 years old white female with a stage IV non-small cell lung cancer, adenocarcinoma that was diagnosed in December 2019 with positive KRAS G12C mutation and PD-L1 expression of 1%. The patient started systemic chemotherapy initially with carboplatin, Alimta and Keytruda for 4 cycles but Keytruda was discontinued starting from cycle #3 because of toxicity issues. The patient was then on observation and she had evidence for disease progression and resumed her treatment with single agent Alimta for 3 cycles but this was on hold since August 23, 2018 secondary to renal insufficiency. She had evidence for disease progression few months ago and  the patient started treatment with [name] (Adagrasib) 600 mg p.o. daily on February 20, 2022 but her dose was dropped to 400 mg p.o. twice daily on March 10, 2022. She was in the hospital and her treatment has been on hold from March 15, 2022 until May 07, 2022 The patient has been tolerating her treatment with [name] (Adagrasib) at a reduced dose of 400 mg p.o. twice daily since March 2024 except for intermittent episodes of diarrhea and constipation.  I recommended for the patient to continue her current treatment with [name] (Adagrasib) with the same dose. I will see her back for follow-up visit in around 1 months with repeat CT scan of the chest, abdomen and pelvis for restaging of her disease. She was advised to call immediately if she has any other concerning symptoms in the interval. The patient voices understanding of current disease status and treatment options and is in agreement with the current care plan.  All questions were answered. The patient knows to call the clinic with any problems, questions or concerns. We can certainly see the patient much sooner if necessary.  The total time spent in the appointment was 20 minutes.  Disclaimer: This note was dictated with voice recognition software. Similar sounding words can inadvertently be transcribed and may not be corrected upon review.

## 2022-09-07 NOTE — Telephone Encounter (Signed)
Called patient regarding July appointments, patient is notified.  

## 2022-09-10 ENCOUNTER — Other Ambulatory Visit: Payer: Self-pay | Admitting: Pulmonary Disease

## 2022-09-17 ENCOUNTER — Other Ambulatory Visit (HOSPITAL_COMMUNITY): Payer: Self-pay

## 2022-09-21 ENCOUNTER — Other Ambulatory Visit (HOSPITAL_COMMUNITY): Payer: Self-pay

## 2022-09-23 ENCOUNTER — Other Ambulatory Visit: Payer: Self-pay

## 2022-09-23 ENCOUNTER — Other Ambulatory Visit (HOSPITAL_COMMUNITY): Payer: Self-pay

## 2022-09-30 ENCOUNTER — Other Ambulatory Visit: Payer: Managed Care, Other (non HMO)

## 2022-10-05 ENCOUNTER — Other Ambulatory Visit (HOSPITAL_COMMUNITY): Payer: Managed Care, Other (non HMO)

## 2022-10-05 ENCOUNTER — Other Ambulatory Visit: Payer: Self-pay

## 2022-10-05 ENCOUNTER — Inpatient Hospital Stay: Payer: Managed Care, Other (non HMO) | Attending: Physician Assistant

## 2022-10-05 ENCOUNTER — Ambulatory Visit (HOSPITAL_COMMUNITY)
Admission: RE | Admit: 2022-10-05 | Discharge: 2022-10-05 | Disposition: A | Discharge status: Home / Self Care | Payer: Managed Care, Other (non HMO) | Source: Ambulatory Visit | Attending: Internal Medicine | Admitting: Internal Medicine

## 2022-10-05 DIAGNOSIS — C3490 Malignant neoplasm of unspecified part of unspecified bronchus or lung: Secondary | ICD-10-CM | POA: Diagnosis present

## 2022-10-05 DIAGNOSIS — R490 Dysphonia: Secondary | ICD-10-CM | POA: Insufficient documentation

## 2022-10-05 DIAGNOSIS — R112 Nausea with vomiting, unspecified: Secondary | ICD-10-CM | POA: Insufficient documentation

## 2022-10-05 DIAGNOSIS — N289 Disorder of kidney and ureter, unspecified: Secondary | ICD-10-CM | POA: Insufficient documentation

## 2022-10-05 DIAGNOSIS — J449 Chronic obstructive pulmonary disease, unspecified: Secondary | ICD-10-CM | POA: Insufficient documentation

## 2022-10-05 DIAGNOSIS — C3491 Malignant neoplasm of unspecified part of right bronchus or lung: Secondary | ICD-10-CM | POA: Insufficient documentation

## 2022-10-05 DIAGNOSIS — J439 Emphysema, unspecified: Secondary | ICD-10-CM | POA: Insufficient documentation

## 2022-10-05 DIAGNOSIS — C3492 Malignant neoplasm of unspecified part of left bronchus or lung: Secondary | ICD-10-CM | POA: Insufficient documentation

## 2022-10-05 DIAGNOSIS — Z9981 Dependence on supplemental oxygen: Secondary | ICD-10-CM | POA: Insufficient documentation

## 2022-10-05 LAB — CMP (CANCER CENTER ONLY)
ALT: 17 U/L (ref 0–44)
AST: 14 U/L — ABNORMAL LOW (ref 15–41)
Albumin: 4.3 g/dL (ref 3.5–5.0)
Alkaline Phosphatase: 70 U/L (ref 38–126)
Anion gap: 8 (ref 5–15)
BUN: 25 mg/dL — ABNORMAL HIGH (ref 8–23)
CO2: 25 mmol/L (ref 22–32)
Calcium: 9 mg/dL (ref 8.9–10.3)
Chloride: 109 mmol/L (ref 98–111)
Creatinine: 1.38 mg/dL — ABNORMAL HIGH (ref 0.44–1.00)
GFR, Estimated: 42 mL/min — ABNORMAL LOW (ref >60)
Glucose, Bld: 168 mg/dL — ABNORMAL HIGH (ref 70–99)
Potassium: 4.1 mmol/L (ref 3.5–5.1)
Sodium: 142 mmol/L (ref 135–145)
Total Bilirubin: 0.5 mg/dL (ref 0.3–1.2)
Total Protein: 6.8 g/dL (ref 6.5–8.1)

## 2022-10-05 LAB — CBC WITH DIFFERENTIAL (CANCER CENTER ONLY)
Abs Immature Granulocytes: 0.05 10*3/uL (ref 0.00–0.07)
Basophils Absolute: 0.1 10*3/uL (ref 0.0–0.1)
Basophils Relative: 1 %
Eosinophils Absolute: 0.3 10*3/uL (ref 0.0–0.5)
Eosinophils Relative: 4 %
HCT: 37.9 % (ref 36.0–46.0)
Hemoglobin: 12.8 g/dL (ref 12.0–15.0)
Immature Granulocytes: 1 %
Lymphocytes Relative: 13 %
Lymphs Abs: 1.1 10*3/uL (ref 0.7–4.0)
MCH: 30.7 pg (ref 26.0–34.0)
MCHC: 33.8 g/dL (ref 30.0–36.0)
MCV: 90.9 fL (ref 80.0–100.0)
Monocytes Absolute: 0.8 10*3/uL (ref 0.1–1.0)
Monocytes Relative: 10 %
Neutro Abs: 5.9 10*3/uL (ref 1.7–7.7)
Neutrophils Relative %: 71 %
Platelet Count: 204 10*3/uL (ref 150–400)
RBC: 4.17 MIL/uL (ref 3.87–5.11)
RDW: 14.5 % (ref 11.5–15.5)
WBC Count: 8.2 10*3/uL (ref 4.0–10.5)
nRBC: 0 % (ref 0.0–0.2)

## 2022-10-06 ENCOUNTER — Other Ambulatory Visit (HOSPITAL_COMMUNITY): Payer: Self-pay

## 2022-10-06 ENCOUNTER — Other Ambulatory Visit: Payer: Self-pay

## 2022-10-06 ENCOUNTER — Inpatient Hospital Stay: Payer: Managed Care, Other (non HMO) | Admitting: Internal Medicine

## 2022-10-12 NOTE — Progress Notes (Deleted)
[facility] OFFICE PROGRESS NOTE  [name], NP [address] [place] [address]  DIAGNOSIS: Stage IV (T1a, N0, M1 a) non-small cell lung cancer, adenocarcinoma presented with multifocal disease in both lungs including 3 nodules in the left lung as well as 2 nodules in the right lung diagnosed in December 2019.    Molecular studies showed: KRAS G12C BCOR N1425S-subclonal RAD21 amplification   PDL 1 expression 1%.  PRIOR THERAPY: 1) Systemic chemotherapy with carboplatin for AUC of 5, Alimta 500 mg/M2 and Keytruda 200 mg IV every 3 weeks.  First dose April 12, 2018. Status post 4 cycles. starting from cycle #3, Keytruda was dropped and patient received treatment with Carboplatin for an AUC of 5 and Alimta 500 mg/m2 IV every 3 weeks.  2) Systemic chemotherapy with Alimta 500 mg/m2 IV every 3 weeks. Status post 3 cycles of single agent Alimta. Most recent dose given on 08/23/2018.  The treatment is currently on hold secondary to renal insufficiency  CURRENT THERAPY: Krazati 600 mg BID, first dose on 02/20/22. Dose reduced to 400 mg BID starting on 03/10/22. However, the patient states this was on hold since she was in the hospital on 03/15/22 and she had not resumed it until 05/07/22.   INTERVAL HISTORY: [name] 67 y.o. female returns to clinic today for follow-up visit.  The patient was last seen by Dr. Arbutus Ped 1 month ago on 09/07/2022.  The patient is currently on dose reduced targeted treatment with [place].  Thus far she has been tolerating the dose reduction fairly well.  Today she denies any fevers.  Denies any chills or night sweats.  Weight loss?  She reports her baseline dyspnea on exertion is***.  She is on supplemental oxygen at baseline with 3 L.  Denies any hemoptysis.  Denies any significant cough or chest pain.  Sporadic nausea and vomiting?  Has this at baseline she intermittently alternates between diarrhea and constipation.  Denies any  headache or visual changes.  Denies any abdominal pain.  Denies any jaundice or itching.  She recently had a restaging CT scan performed she is here today for evaluation and to review her scan results.    MEDICAL HISTORY: Past Medical History:  Diagnosis Date   Anemia    "long time ago" (12/06/2012)   Anxiety    Arthritis    "right knee real bad; in my hands bad" (12/06/2012)   Asthma    Celiac disease    Colon polyps    COPD (chronic obstructive pulmonary disease) (HCC)    Coronary artery disease    a. s/p Xience DES to [facility] 04/2008;  b. LHC 05/2008: Proximal RCA 25%, mid RCA stent patent.  c. Anormal nuc 2014 -> s/p LHC with severe mRCA stenosis s/p DES. d. Cath 04/2013 s/p DES to LAD.  e. LHC 08/2015 was stable.   Depression    "years ago" (12/06/2012)   Fibromyalgia    Gallstones    GERD (gastroesophageal reflux disease)    H/O hiatal hernia    Hepatitis    "not A, B, or C" (12/06/2012)   Hyperlipidemia    intol to statins and other chol agents due to elevated LFTs   Hypertension    Hypothyroidism    IBS (irritable bowel syndrome)    Interstitial cystitis    Lung cancer (HCC) dx'd 01/2018   Lung nodules    Bilateral   Migraines    "when I was younger" (12/06/2012)  Pneumonia    PONV (postoperative nausea and vomiting)    "Very bad"   Premature atrial contractions    PVC's (premature ventricular contractions)    Sinus headache    SVT (supraventricular tachycardia)     ALLERGIES:  is allergic to ciprofloxacin, levofloxacin, statins, tricor [fenofibrate], compazine [prochlorperazine], latex, codeine, and metoprolol.  MEDICATIONS:  Current Outpatient Medications  Medication Sig Dispense Refill   adagrasib (KRAZATI) 200 MG tablet Take 2 tablets (400 mg total) by mouth 2 (two) times daily. 180 tablet 3   albuterol (PROVENTIL) (5 MG/ML) 0.5% nebulizer solution Take 2.5 mg by nebulization every 6 (six) hours as needed for wheezing.     albuterol (VENTOLIN HFA) 108 (90 Base)  MCG/ACT inhaler Inhale 1 puff into the lungs every 6 (six) hours as needed (wheezing/shortness of breath.).      aspirin 81 MG chewable tablet Chew 1 tablet (81 mg total) by mouth daily.     Budeson-Glycopyrrol-Formoterol (BREZTRI AEROSPHERE) 160-9-4.8 MCG/ACT AERO Inhale 2 puffs into the lungs in the morning and at bedtime. 10.7 g 5   buPROPion (WELLBUTRIN XL) 150 MG 24 hr tablet Take 1-2 tablets by mouth 2 (two) times daily. Patient takes 2 tablets (300mg ) in the morning and 1 tablet (150mg ) in the evening     ferrous sulfate (FEROSUL) 325 (65 FE) MG tablet TAKE 1 TABLET(325 MG) BY MOUTH DAILY WITH BREAKFAST 30 tablet 2   gabapentin (NEURONTIN) 100 MG capsule TAKE 1 CAPSULE(100 MG) BY MOUTH THREE TIMES DAILY. 90 capsule 2   HYDROcodone-acetaminophen (NORCO/VICODIN) 5-325 MG tablet Take 1 tablet by mouth 4 (four) times daily as needed (pain).     ipratropium-albuterol (DUONEB) 0.5-2.5 (3) MG/3ML SOLN Inhale 3 mLs into the lungs every 6 (six) hours as needed (Asthma). 360 mL 3   loperamide (IMODIUM A-D) 2 MG tablet Take 1 tablet (2 mg total) by mouth 4 (four) times daily as needed for diarrhea or loose stools. 20 tablet 0   LORazepam (ATIVAN) 2 MG tablet Take 2 mg by mouth every evening.     nitroGLYCERIN (NITROSTAT) 0.4 MG SL tablet Place 1 tablet (0.4 mg total) under the tongue every 5 (five) minutes as needed for chest pain. 25 tablet 12   ondansetron (ZOFRAN-ODT) 4 MG disintegrating tablet Take 1 tablet (4 mg total) by mouth every 8 (eight) hours as needed for nausea or vomiting. 30 tablet 2   No current facility-administered medications for this visit.    SURGICAL HISTORY:  Past Surgical History:  Procedure Laterality Date   CARDIAC CATHETERIZATION  04/2008; 05/2008   CARDIAC CATHETERIZATION N/A 08/07/2015   Procedure: Left Heart Cath and Coronary Angiography;  Surgeon: Tonny Bollman, MD;  Location: [facility] INVASIVE CV LAB;  Service: Cardiovascular;  Laterality: N/A;   CHOLECYSTECTOMY      CORONARY ANGIOPLASTY WITH STENT PLACEMENT  04/2008 12/06/2012   "1 + 1" (12/06/2012)   CORONARY STENT PLACEMENT  05/08/2013   DES TO LAD      DR MCALHANY   IR IMAGING GUIDED PORT INSERTION  04/29/2018   LEFT HEART CATHETERIZATION WITH CORONARY ANGIOGRAM N/A 12/06/2012   Procedure: LEFT HEART CATHETERIZATION WITH CORONARY ANGIOGRAM;  Surgeon: Kathleene Hazel, MD;  Location: [facility] CATH LAB;  Service: Cardiovascular;  Laterality: N/A;   LEFT HEART CATHETERIZATION WITH CORONARY ANGIOGRAM N/A 05/08/2013   Procedure: LEFT HEART CATHETERIZATION WITH CORONARY ANGIOGRAM;  Surgeon: Kathleene Hazel, MD;  Location: [facility] CATH LAB;  Service: Cardiovascular;  Laterality: N/A;   LEFT HEART CATHETERIZATION WITH CORONARY  ANGIOGRAM N/A 01/04/2014   Procedure: LEFT HEART CATHETERIZATION WITH CORONARY ANGIOGRAM;  Surgeon: Kathleene Hazel, MD;  Location: [facility] CATH LAB;  Service: Cardiovascular;  Laterality: N/A;   PERCUTANEOUS CORONARY STENT INTERVENTION (PCI-S)  12/06/2012   Procedure: PERCUTANEOUS CORONARY STENT INTERVENTION (PCI-S);  Surgeon: Kathleene Hazel, MD;  Location: [facility] CATH LAB;  Service: Cardiovascular;;   PERCUTANEOUS CORONARY STENT INTERVENTION (PCI-S)  05/08/2013   Procedure: PERCUTANEOUS CORONARY STENT INTERVENTION (PCI-S);  Surgeon: Kathleene Hazel, MD;  Location: [facility] CATH LAB;  Service: Cardiovascular;;  mid LAD    TUBAL LIGATION     VAGINAL HYSTERECTOMY     VIDEO BRONCHOSCOPY WITH ENDOBRONCHIAL NAVIGATION N/A 02/17/2018   Procedure: VIDEO BRONCHOSCOPY WITH ENDOBRONCHIAL NAVIGATION;  Surgeon: Loreli Slot, MD;  Location: [facility] OR;  Service: Thoracic;  Laterality: N/A;    REVIEW OF SYSTEMS:   Review of Systems  Constitutional: Negative for appetite change, chills, fatigue, fever and unexpected weight change.  HENT:   Negative for mouth sores, nosebleeds, sore throat and trouble swallowing.   Eyes: Negative for eye problems and icterus.  Respiratory: Negative for cough,  hemoptysis, shortness of breath and wheezing.   Cardiovascular: Negative for chest pain and leg swelling.  Gastrointestinal: Negative for abdominal pain, constipation, diarrhea, nausea and vomiting.  Genitourinary: Negative for bladder incontinence, difficulty urinating, dysuria, frequency and hematuria.   Musculoskeletal: Negative for back pain, gait problem, neck pain and neck stiffness.  Skin: Negative for itching and rash.  Neurological: Negative for dizziness, extremity weakness, gait problem, headaches, light-headedness and seizures.  Hematological: Negative for adenopathy. Does not bruise/bleed easily.  Psychiatric/Behavioral: Negative for confusion, depression and sleep disturbance. The patient is not nervous/anxious.     PHYSICAL EXAMINATION:  There were no vitals taken for this visit.  ECOG PERFORMANCE STATUS: {CHL ONC ECOG [id number]  Physical Exam  Constitutional: Oriented to person, place, and time and well-developed, well-nourished, and in no distress. No distress.  HENT:  Head: Normocephalic and atraumatic.  Mouth/Throat: Oropharynx is clear and moist. No oropharyngeal exudate.  Eyes: Conjunctivae are normal. Right eye exhibits no discharge. Left eye exhibits no discharge. No scleral icterus.  Neck: Normal range of motion. Neck supple.  Cardiovascular: Normal rate, regular rhythm, normal heart sounds and intact distal pulses.   Pulmonary/Chest: Effort normal and breath sounds normal. No respiratory distress. No wheezes. No rales.  Abdominal: Soft. Bowel sounds are normal. Exhibits no distension and no mass. There is no tenderness.  Musculoskeletal: Normal range of motion. Exhibits no edema.  Lymphadenopathy:    No cervical adenopathy.  Neurological: Alert and oriented to person, place, and time. Exhibits normal muscle tone. Gait normal. Coordination normal.  Skin: Skin is warm and dry. No rash noted. Not diaphoretic. No erythema. No pallor.  Psychiatric: Mood, memory  and judgment normal.  Vitals reviewed.  LABORATORY DATA: Lab Results  Component Value Date   WBC 8.2 10/05/2022   HGB 12.8 10/05/2022   HCT 37.9 10/05/2022   MCV 90.9 10/05/2022   PLT 204 10/05/2022      Chemistry      Component Value Date/Time   NA 142 10/05/2022 1547   NA 143 01/10/2018 1629   K 4.1 10/05/2022 1547   CL 109 10/05/2022 1547   CO2 25 10/05/2022 1547   BUN 25 (H) 10/05/2022 1547   BUN 14 01/10/2018 1629   CREATININE 1.38 (H) 10/05/2022 1547   CREATININE 0.68 08/05/2015 1456      Component Value Date/Time   CALCIUM 9.0 10/05/2022  1547   ALKPHOS 70 10/05/2022 1547   AST 14 (L) 10/05/2022 1547   ALT 17 10/05/2022 1547   BILITOT 0.5 10/05/2022 1547       RADIOGRAPHIC STUDIES:  CT Abdomen Pelvis Wo Contrast  Result Date: 10/09/2022 CLINICAL DATA:  Restaging non-small cell lung cancer. * Tracking Code: BO * EXAM: CT CHEST, ABDOMEN AND PELVIS WITHOUT CONTRAST TECHNIQUE: Multidetector CT imaging of the chest, abdomen and pelvis was performed following the standard protocol without IV contrast. RADIATION DOSE REDUCTION: This exam was performed according to the departmental dose-optimization program which includes automated exposure control, adjustment of the mA and/or kV according to patient size and/or use of iterative reconstruction technique. COMPARISON:  CT scan 06/17/2022 and prior PET-CT 02/12/2022 FINDINGS: CT CHEST FINDINGS Cardiovascular: The heart is normal in size. No pericardial effusion. The aorta is normal in caliber. No aneurysm. Stable aortic and coronary artery calcifications, advanced for age. Mediastinum/Nodes: Stable small scattered mediastinal and hilar lymph nodes measuring less than 8 mm. The esophagus is unremarkable. Lungs/Pleura: Stable underlying emphysematous changes and areas of pulmonary scarring. Mixed solid and sub solid lesion in the right upper lobe on image 47/4. This measures 2.0 x 1.1 cm and previously measured 2.0 x 1.1 cm. It  appears likely more solid superiorly and it did on the prior study and long need continued close observation. Sub solid nodular opacity in the superior segment of the left lower lobe adjacent to the major fissure measures a maximum of 9 mm and appears stable. There is slight tenting of the major fissure. Other scattered ground-glass nodules appears stable including 7 mm nodules on image number 40/4 in the left upper lobe. 7 mm lesion in the right upper lobe on image number 54/4 is stable also. No new pulmonary lesions no pleural effusions or pleural nodules. Musculoskeletal: No breast masses, supraclavicular or axillary adenopathy. No bone lesions. Remote T5 compression fracture. CT ABDOMEN PELVIS FINDINGS Hepatobiliary: No hepatic lesions are identified without contrast. No intrahepatic biliary dilatation. The gallbladder is surgically absent. No common bile duct dilatation. Pancreas: Normal Spleen: Normal Adrenals/Urinary Tract: The adrenal glands are normal. No renal lesions, renal calculi or hydronephrosis. The bladder is unremarkable. Stomach/Bowel: The stomach, duodenum, small bowel and colon are grossly normal without oral contrast. No inflammatory changes, mass lesions or obstructive findings. The appendix is normal. Vascular/Lymphatic: Stable atherosclerotic calcifications involving the aorta and iliac arteries but no aneurysm. No mesenteric or retroperitoneal mass or adenopathy. Reproductive: The uterus is surgically absent. Both ovaries are still present and appear normal. Other: No pelvic mass or adenopathy. No free pelvic fluid collections. No inguinal mass or adenopathy. No abdominal wall hernia or subcutaneous lesions. Musculoskeletal: No significant bony findings. IMPRESSION: 1. Stable mixed solid and sub solid lesion in the right upper lobe. It appears slightly more solid superiorly. Recommend continued close surveillance. Recommend follow-up noncontrast chest CT in 3-6 months 2. Stable scattered  ground-glass nodules in both lungs. Recommend continued surveillance. 3. No findings for abdominal/pelvic metastatic disease. Aortic Atherosclerosis (ICD10-I70.0) and Emphysema (ICD10-J43.9). Electronically Signed   By: Rudie Meyer M.D.   On: 10/09/2022 09:29   CT Chest Wo Contrast  Result Date: 10/09/2022 CLINICAL DATA:  Restaging non-small cell lung cancer. * Tracking Code: BO * EXAM: CT CHEST, ABDOMEN AND PELVIS WITHOUT CONTRAST TECHNIQUE: Multidetector CT imaging of the chest, abdomen and pelvis was performed following the standard protocol without IV contrast. RADIATION DOSE REDUCTION: This exam was performed according to the departmental dose-optimization program which includes automated  exposure control, adjustment of the mA and/or kV according to patient size and/or use of iterative reconstruction technique. COMPARISON:  CT scan 06/17/2022 and prior PET-CT 02/12/2022 FINDINGS: CT CHEST FINDINGS Cardiovascular: The heart is normal in size. No pericardial effusion. The aorta is normal in caliber. No aneurysm. Stable aortic and coronary artery calcifications, advanced for age. Mediastinum/Nodes: Stable small scattered mediastinal and hilar lymph nodes measuring less than 8 mm. The esophagus is unremarkable. Lungs/Pleura: Stable underlying emphysematous changes and areas of pulmonary scarring. Mixed solid and sub solid lesion in the right upper lobe on image 47/4. This measures 2.0 x 1.1 cm and previously measured 2.0 x 1.1 cm. It appears likely more solid superiorly and it did on the prior study and long need continued close observation. Sub solid nodular opacity in the superior segment of the left lower lobe adjacent to the major fissure measures a maximum of 9 mm and appears stable. There is slight tenting of the major fissure. Other scattered ground-glass nodules appears stable including 7 mm nodules on image number 40/4 in the left upper lobe. 7 mm lesion in the right upper lobe on image number 54/4 is  stable also. No new pulmonary lesions no pleural effusions or pleural nodules. Musculoskeletal: No breast masses, supraclavicular or axillary adenopathy. No bone lesions. Remote T5 compression fracture. CT ABDOMEN PELVIS FINDINGS Hepatobiliary: No hepatic lesions are identified without contrast. No intrahepatic biliary dilatation. The gallbladder is surgically absent. No common bile duct dilatation. Pancreas: Normal Spleen: Normal Adrenals/Urinary Tract: The adrenal glands are normal. No renal lesions, renal calculi or hydronephrosis. The bladder is unremarkable. Stomach/Bowel: The stomach, duodenum, small bowel and colon are grossly normal without oral contrast. No inflammatory changes, mass lesions or obstructive findings. The appendix is normal. Vascular/Lymphatic: Stable atherosclerotic calcifications involving the aorta and iliac arteries but no aneurysm. No mesenteric or retroperitoneal mass or adenopathy. Reproductive: The uterus is surgically absent. Both ovaries are still present and appear normal. Other: No pelvic mass or adenopathy. No free pelvic fluid collections. No inguinal mass or adenopathy. No abdominal wall hernia or subcutaneous lesions. Musculoskeletal: No significant bony findings. IMPRESSION: 1. Stable mixed solid and sub solid lesion in the right upper lobe. It appears slightly more solid superiorly. Recommend continued close surveillance. Recommend follow-up noncontrast chest CT in 3-6 months 2. Stable scattered ground-glass nodules in both lungs. Recommend continued surveillance. 3. No findings for abdominal/pelvic metastatic disease. Aortic Atherosclerosis (ICD10-I70.0) and Emphysema (ICD10-J43.9). Electronically Signed   By: Rudie Meyer M.D.   On: 10/09/2022 09:29     ASSESSMENT/PLAN:  This is a very pleasant 67 year old Caucasian female diagnosed with stage IV non-small cell lung cancer, adenocarcinoma. She is negative for any actionable mutations and her PD-L1 expression is 1%.  She was diagnosed in December 2019. She presented with bilateral pulmonary nodules.   She initially started treatment with carboplatin, Alimta, and Keytruda. She was status post 4 cycles but Keytruda was discontinued after cycle #2 due to his significant skin rash. She then was treated with maintenance single agent Alimta for 3 cycles. This has been on hold since June 2020 due to worsening renal insufficiency.   She has been on observation since that time and feeling fairly well except she does have significant COPD for which she is on supplemental oxygen. She follows closely with pulmonology.    In November 2023 the patient had a repeat CT scan which showed new area of nodularity with potential endobronchial extension and lymph node adjacent to the endobronchial material  in the right hilu that is suspicious and a new solid nodule in the medial right lower lobe concerning for additional site of disease.   She had a PET scan which showed evidence of disease progression with multiple bilateral solid and subsolid nodules.   Due to the patient's history of intolerance to chemotherapy she was started on second line targeted treatment with Krazati 600 mg twice daily.  Her first dose was on 02/20/22.  Dose was reduced to 400 mg twice daily around 03/03/2022 due to ongoing symptoms of nausea, vomiting, and diarrhea. She resumed this on 05/07/22.  ***Periodically has elevated LFTs?  The patient was seen with Dr. Arbutus Ped today.  The patient recently had a restaging CT scan performed.  Dr. Arbutus Ped personally and independently reviewed the scan and discussed the results with the patient today.  The scan showed ***Dr. Arbutus Ped recommends that she continue on the same treatment at the same dose.  We will see her back for a follow-up visit in 1 month for evaluation and repeat blood work  The patient was advised to call immediately if she has any concerning symptoms in the interval. The patient voices understanding of  current disease status and treatment options and is in agreement with the current care plan. All questions were answered. The patient knows to call the clinic with any problems, questions or concerns. We can certainly see the patient much sooner if necessary     No orders of the defined types were placed in this encounter.    I spent {CHL ONC TIME VISIT - ZOXWR:[id number]} counseling the patient face to face. The total time spent in the appointment was {CHL ONC TIME VISIT - BJYNW:[id number]}.   L , PA-C 10/12/22

## 2022-10-14 ENCOUNTER — Inpatient Hospital Stay: Payer: Managed Care, Other (non HMO) | Admitting: Physician Assistant

## 2022-10-15 ENCOUNTER — Other Ambulatory Visit (HOSPITAL_COMMUNITY): Payer: Self-pay

## 2022-10-15 ENCOUNTER — Telehealth: Payer: Self-pay | Admitting: Physician Assistant

## 2022-10-15 NOTE — Telephone Encounter (Signed)
Scheduled per scheduling message, called patient regarding upcoming appointments. Left a voicemail.

## 2022-10-19 ENCOUNTER — Other Ambulatory Visit (HOSPITAL_COMMUNITY): Payer: Self-pay

## 2022-10-19 ENCOUNTER — Other Ambulatory Visit: Payer: Self-pay

## 2022-10-20 NOTE — Progress Notes (Unsigned)
[facility] OFFICE PROGRESS NOTE  Diana Confer, NP [address] [place] [address]  DIAGNOSIS: Stage IV (T1a, N0, M1 a) non-small cell lung cancer, adenocarcinoma presented with multifocal disease in both lungs including 3 nodules in the left lung as well as 2 nodules in the right lung diagnosed in December 2019.    Molecular studies showed: KRAS G12C BCOR [id number]-subclonal RAD21 amplification   PDL 1 expression 1%.  PRIOR THERAPY: 1) Systemic chemotherapy with carboplatin for AUC of 5, Alimta 500 mg/M2 and Keytruda 200 mg IV every 3 weeks.  First dose April 12, 2018. Status post 4 cycles. starting from cycle #3, Keytruda was dropped and patient received treatment with Carboplatin for an AUC of 5 and Alimta 500 mg/m2 IV every 3 weeks.  2) Systemic chemotherapy with Alimta 500 mg/m2 IV every 3 weeks. Status post 3 cycles of single agent Alimta. Most recent dose given on 08/23/2018.  The treatment is currently on hold secondary to renal insufficiency  CURRENT THERAPY:  Krazati 600 mg BID, first dose on 02/20/22. Dose reduced to 400 mg BID starting on 03/10/22. However, the patient states this was on hold since she was in the hospital on 03/15/22 and she had not resumed it until 05/07/22.   INTERVAL HISTORY: [name] 67 y.o. female returns to the clinic today for a follow-up visit.  The patient was last seen by Dr. Arbutus Ped 1 month ago on 09/07/2022.  The patient was supposed to have a follow-up appointment last week but had a viral infection.  Because she was not feeling well, she delayed her appointment by 1 week.  She has recovered from her recent viral illness.    The patient is currently on dose reduced targeted treatment with [place].  Thus far she has been tolerating the dose reduction fairly well without any appreciable side effects. She has a lot of unusual symptoms that have been ongoing even prior to starting [place] which is unchanged. For example,  she gets diarrhea randomly as well as nausea/vomiting. However, she states she had this before [place] and has not noticed any change in these symptoms since starting treatment.    Today she denies any fevers.  Denies any chills or night sweats. She reports her baseline dyspnea with minimal on exertion is unfortunately troubling to her that this is not getting better. She is on supplemental oxygen at baseline with 3 L chronically. She received a new inhaler last time she saw pulmonary medicine. She states they talked to her about pulmonary rehab before, but she has some transportation constraints.  She does not drive.  She also mention she has been struggling with mild hoarseness.  She saw ENT in the past for different concern and had a poor experience and is not wanting to return.  Denies any significant cough.  She may cough "just now and then".  She denies any hemoptysis.  Sometimes she has a bloated feeling in the low upper abdominal quadrants and lower rib cage.  She had a little bit of a sinus frontal headache recently for which Advil helps.  She mentions that her urine was orange approximately 2 weeks ago with a foul odor.  She denies any dysuria.  She recently had a restaging CT scan performed.  She is here today for evaluation and to review her scan results.    MEDICAL HISTORY: Past Medical History:  Diagnosis Date   Anemia    "long time ago" (12/06/2012)  Anxiety    Arthritis    "right knee real bad; in my hands bad" (12/06/2012)   Asthma    Celiac disease    Colon polyps    COPD (chronic obstructive pulmonary disease) (HCC)    Coronary artery disease    a. s/p Xience DES to [facility] 04/2008;  b. LHC 05/2008: Proximal RCA 25%, mid RCA stent patent.  c. Anormal nuc 2014 -> s/p LHC with severe mRCA stenosis s/p DES. d. Cath 04/2013 s/p DES to LAD.  e. LHC 08/2015 was stable.   Depression    "years ago" (12/06/2012)   Fibromyalgia    Gallstones    GERD (gastroesophageal reflux disease)    H/O  hiatal hernia    Hepatitis    "not A, B, or C" (12/06/2012)   Hyperlipidemia    intol to statins and other chol agents due to elevated LFTs   Hypertension    Hypothyroidism    IBS (irritable bowel syndrome)    Interstitial cystitis    Lung cancer (HCC) dx'd 01/2018   Lung nodules    Bilateral   Migraines    "when I was younger" (12/06/2012)   Pneumonia    PONV (postoperative nausea and vomiting)    "Very bad"   Premature atrial contractions    PVC's (premature ventricular contractions)    Sinus headache    SVT (supraventricular tachycardia)     ALLERGIES:  is allergic to ciprofloxacin, levofloxacin, statins, tricor [fenofibrate], compazine [prochlorperazine], latex, codeine, and metoprolol.  MEDICATIONS:  Current Outpatient Medications  Medication Sig Dispense Refill   adagrasib (KRAZATI) 200 MG tablet Take 2 tablets (400 mg total) by mouth 2 (two) times daily. 180 tablet 3   albuterol (PROVENTIL) (5 MG/ML) 0.5% nebulizer solution Take 2.5 mg by nebulization every 6 (six) hours as needed for wheezing.     albuterol (VENTOLIN HFA) 108 (90 Base) MCG/ACT inhaler Inhale 1 puff into the lungs every 6 (six) hours as needed (wheezing/shortness of breath.).      aspirin 81 MG chewable tablet Chew 1 tablet (81 mg total) by mouth daily.     Budeson-Glycopyrrol-Formoterol (BREZTRI AEROSPHERE) 160-9-4.8 MCG/ACT AERO Inhale 2 puffs into the lungs in the morning and at bedtime. 10.7 g 5   buPROPion (WELLBUTRIN XL) 150 MG 24 hr tablet Take 1-2 tablets by mouth 2 (two) times daily. Patient takes 2 tablets (300mg ) in the morning and 1 tablet (150mg ) in the evening     ferrous sulfate (FEROSUL) 325 (65 FE) MG tablet TAKE 1 TABLET(325 MG) BY MOUTH DAILY WITH BREAKFAST 30 tablet 2   gabapentin (NEURONTIN) 100 MG capsule TAKE 1 CAPSULE(100 MG) BY MOUTH THREE TIMES DAILY. 90 capsule 2   HYDROcodone-acetaminophen (NORCO/VICODIN) 5-325 MG tablet Take 1 tablet by mouth 4 (four) times daily as needed (pain).      ipratropium-albuterol (DUONEB) 0.5-2.5 (3) MG/3ML SOLN Inhale 3 mLs into the lungs every 6 (six) hours as needed (Asthma). 360 mL 3   loperamide (IMODIUM A-D) 2 MG tablet Take 1 tablet (2 mg total) by mouth 4 (four) times daily as needed for diarrhea or loose stools. 20 tablet 0   LORazepam (ATIVAN) 2 MG tablet Take 2 mg by mouth every evening.     nitroGLYCERIN (NITROSTAT) 0.4 MG SL tablet Place 1 tablet (0.4 mg total) under the tongue every 5 (five) minutes as needed for chest pain. 25 tablet 12   ondansetron (ZOFRAN-ODT) 4 MG disintegrating tablet Take 1 tablet (4 mg total) by mouth every 8 (  eight) hours as needed for nausea or vomiting. 30 tablet 2   No current facility-administered medications for this visit.    SURGICAL HISTORY:  Past Surgical History:  Procedure Laterality Date   CARDIAC CATHETERIZATION  04/2008; 05/2008   CARDIAC CATHETERIZATION N/A 08/07/2015   Procedure: Left Heart Cath and Coronary Angiography;  Surgeon: Tonny Bollman, MD;  Location: [facility] INVASIVE CV LAB;  Service: Cardiovascular;  Laterality: N/A;   CHOLECYSTECTOMY     CORONARY ANGIOPLASTY WITH STENT PLACEMENT  04/2008 12/06/2012   "1 + 1" (12/06/2012)   CORONARY STENT PLACEMENT  05/08/2013   DES TO LAD      DR MCALHANY   IR IMAGING GUIDED PORT INSERTION  04/29/2018   LEFT HEART CATHETERIZATION WITH CORONARY ANGIOGRAM N/A 12/06/2012   Procedure: LEFT HEART CATHETERIZATION WITH CORONARY ANGIOGRAM;  Surgeon: Kathleene Hazel, MD;  Location: [facility] CATH LAB;  Service: Cardiovascular;  Laterality: N/A;   LEFT HEART CATHETERIZATION WITH CORONARY ANGIOGRAM N/A 05/08/2013   Procedure: LEFT HEART CATHETERIZATION WITH CORONARY ANGIOGRAM;  Surgeon: Kathleene Hazel, MD;  Location: [facility] CATH LAB;  Service: Cardiovascular;  Laterality: N/A;   LEFT HEART CATHETERIZATION WITH CORONARY ANGIOGRAM N/A 01/04/2014   Procedure: LEFT HEART CATHETERIZATION WITH CORONARY ANGIOGRAM;  Surgeon: Kathleene Hazel, MD;  Location: [facility]  CATH LAB;  Service: Cardiovascular;  Laterality: N/A;   PERCUTANEOUS CORONARY STENT INTERVENTION (PCI-S)  12/06/2012   Procedure: PERCUTANEOUS CORONARY STENT INTERVENTION (PCI-S);  Surgeon: Kathleene Hazel, MD;  Location: [facility] CATH LAB;  Service: Cardiovascular;;   PERCUTANEOUS CORONARY STENT INTERVENTION (PCI-S)  05/08/2013   Procedure: PERCUTANEOUS CORONARY STENT INTERVENTION (PCI-S);  Surgeon: Kathleene Hazel, MD;  Location: [facility] CATH LAB;  Service: Cardiovascular;;  mid LAD    TUBAL LIGATION     VAGINAL HYSTERECTOMY     VIDEO BRONCHOSCOPY WITH ENDOBRONCHIAL NAVIGATION N/A 02/17/2018   Procedure: VIDEO BRONCHOSCOPY WITH ENDOBRONCHIAL NAVIGATION;  Surgeon: Loreli Slot, MD;  Location: [facility] OR;  Service: Thoracic;  Laterality: N/A;    REVIEW OF SYSTEMS:   Constitutional: + chronic fatigue . negative for appetite change, fever and unexpected weight change.  HENT: Negative for mouth sores, nosebleeds, sore throat and trouble swallowing.   Eyes: Negative for eye problems and icterus.  Respiratory: Positive for chronic baseline dyspnea on exertion with minimal exertion. Negative for  hemoptysis, cough, and wheezing.   Cardiovascular: Negative for chest pain and leg swelling.  Gastrointestinal: Positive for intermittent nausea, vomiting, and diarrhea.  Negative for  constipation. Genitourinary: Positive for foul odor with urine. Negative for bladder incontinence, difficulty urinating, dysuria, frequency and hematuria.   Musculoskeletal: positive for chronic back pain.  Negative for gait problem, neck pain and neck stiffness.  Skin: Negative for itching and rash.  Neurological: Positive for poor balance.  Positive for occasional frontal headaches.  Negative for dizziness, extremity weakness, gait problem, headaches, light-headedness and seizures.  Hematological: Negative for adenopathy. Does not bruise/bleed easily.  Psychiatric/Behavioral: Negative for confusion, depression and sleep  disturbance. The patient is not nervous/anxious.     PHYSICAL EXAMINATION:  There were no vitals taken for this visit.  ECOG PERFORMANCE STATUS: 2-3  Physical Exam  Constitutional: Oriented to person, place, and time and chronically ill-appearing female and in no distress.  HENT:  Head: Normocephalic and atraumatic.  Mouth/Throat: Oropharynx is clear and moist. No oropharyngeal exudate.  Eyes: Conjunctivae are normal. Right eye exhibits no discharge. Left eye exhibits no discharge. No scleral icterus.  Neck: Normal range of motion. Neck supple.  Cardiovascular: Normal rate, regular rhythm, normal heart sounds and intact distal pulses.   Pulmonary/Chest: Effort normal.  Quiet breath sounds bilaterally.  On supplemental oxygen.  No respiratory distress. No wheezes. No rales.  Abdominal: Soft. Bowel sounds are normal. Exhibits no distension and no mass. There is no tenderness.  Musculoskeletal: Normal range of motion. Exhibits no edema.  Lymphadenopathy:    No cervical adenopathy.  Neurological: Alert and oriented to person, place, and time. Exhibits muscle wasting. Examined in the wheelchair.   Skin: Skin is warm and dry. No rash noted. Not diaphoretic. No erythema. No pallor.  Psychiatric: Mood, memory and judgment normal.   LABORATORY DATA: Lab Results  Component Value Date   WBC 8.2 10/05/2022   HGB 12.8 10/05/2022   HCT 37.9 10/05/2022   MCV 90.9 10/05/2022   PLT 204 10/05/2022      Chemistry      Component Value Date/Time   NA 142 10/05/2022 1547   NA 143 01/10/2018 1629   K 4.1 10/05/2022 1547   CL 109 10/05/2022 1547   CO2 25 10/05/2022 1547   BUN 25 (H) 10/05/2022 1547   BUN 14 01/10/2018 1629   CREATININE 1.38 (H) 10/05/2022 1547   CREATININE 0.68 08/05/2015 1456      Component Value Date/Time   CALCIUM 9.0 10/05/2022 1547   ALKPHOS 70 10/05/2022 1547   AST 14 (L) 10/05/2022 1547   ALT 17 10/05/2022 1547   BILITOT 0.5 10/05/2022 1547       RADIOGRAPHIC  STUDIES:  CT Abdomen Pelvis Wo Contrast  Result Date: 10/09/2022 CLINICAL DATA:  Restaging non-small cell lung cancer. * Tracking Code: BO * EXAM: CT CHEST, ABDOMEN AND PELVIS WITHOUT CONTRAST TECHNIQUE: Multidetector CT imaging of the chest, abdomen and pelvis was performed following the standard protocol without IV contrast. RADIATION DOSE REDUCTION: This exam was performed according to the departmental dose-optimization program which includes automated exposure control, adjustment of the mA and/or kV according to patient size and/or use of iterative reconstruction technique. COMPARISON:  CT scan 06/17/2022 and prior PET-CT 02/12/2022 FINDINGS: CT CHEST FINDINGS Cardiovascular: The heart is normal in size. No pericardial effusion. The aorta is normal in caliber. No aneurysm. Stable aortic and coronary artery calcifications, advanced for age. Mediastinum/Nodes: Stable small scattered mediastinal and hilar lymph nodes measuring less than 8 mm. The esophagus is unremarkable. Lungs/Pleura: Stable underlying emphysematous changes and areas of pulmonary scarring. Mixed solid and sub solid lesion in the right upper lobe on image 47/4. This measures 2.0 x 1.1 cm and previously measured 2.0 x 1.1 cm. It appears likely more solid superiorly and it did on the prior study and long need continued close observation. Sub solid nodular opacity in the superior segment of the left lower lobe adjacent to the major fissure measures a maximum of 9 mm and appears stable. There is slight tenting of the major fissure. Other scattered ground-glass nodules appears stable including 7 mm nodules on image number 40/4 in the left upper lobe. 7 mm lesion in the right upper lobe on image number 54/4 is stable also. No new pulmonary lesions no pleural effusions or pleural nodules. Musculoskeletal: No breast masses, supraclavicular or axillary adenopathy. No bone lesions. Remote T5 compression fracture. CT ABDOMEN PELVIS FINDINGS Hepatobiliary:  No hepatic lesions are identified without contrast. No intrahepatic biliary dilatation. The gallbladder is surgically absent. No common bile duct dilatation. Pancreas: Normal Spleen: Normal Adrenals/Urinary Tract: The adrenal glands are normal. No renal lesions, renal calculi or hydronephrosis. The  bladder is unremarkable. Stomach/Bowel: The stomach, duodenum, small bowel and colon are grossly normal without oral contrast. No inflammatory changes, mass lesions or obstructive findings. The appendix is normal. Vascular/Lymphatic: Stable atherosclerotic calcifications involving the aorta and iliac arteries but no aneurysm. No mesenteric or retroperitoneal mass or adenopathy. Reproductive: The uterus is surgically absent. Both ovaries are still present and appear normal. Other: No pelvic mass or adenopathy. No free pelvic fluid collections. No inguinal mass or adenopathy. No abdominal wall hernia or subcutaneous lesions. Musculoskeletal: No significant bony findings. IMPRESSION: 1. Stable mixed solid and sub solid lesion in the right upper lobe. It appears slightly more solid superiorly. Recommend continued close surveillance. Recommend follow-up noncontrast chest CT in 3-6 months 2. Stable scattered ground-glass nodules in both lungs. Recommend continued surveillance. 3. No findings for abdominal/pelvic metastatic disease. Aortic Atherosclerosis (ICD10-I70.0) and Emphysema (ICD10-J43.9). Electronically Signed   By: Rudie Meyer M.D.   On: 10/09/2022 09:29   CT Chest Wo Contrast  Result Date: 10/09/2022 CLINICAL DATA:  Restaging non-small cell lung cancer. * Tracking Code: BO * EXAM: CT CHEST, ABDOMEN AND PELVIS WITHOUT CONTRAST TECHNIQUE: Multidetector CT imaging of the chest, abdomen and pelvis was performed following the standard protocol without IV contrast. RADIATION DOSE REDUCTION: This exam was performed according to the departmental dose-optimization program which includes automated exposure control,  adjustment of the mA and/or kV according to patient size and/or use of iterative reconstruction technique. COMPARISON:  CT scan 06/17/2022 and prior PET-CT 02/12/2022 FINDINGS: CT CHEST FINDINGS Cardiovascular: The heart is normal in size. No pericardial effusion. The aorta is normal in caliber. No aneurysm. Stable aortic and coronary artery calcifications, advanced for age. Mediastinum/Nodes: Stable small scattered mediastinal and hilar lymph nodes measuring less than 8 mm. The esophagus is unremarkable. Lungs/Pleura: Stable underlying emphysematous changes and areas of pulmonary scarring. Mixed solid and sub solid lesion in the right upper lobe on image 47/4. This measures 2.0 x 1.1 cm and previously measured 2.0 x 1.1 cm. It appears likely more solid superiorly and it did on the prior study and long need continued close observation. Sub solid nodular opacity in the superior segment of the left lower lobe adjacent to the major fissure measures a maximum of 9 mm and appears stable. There is slight tenting of the major fissure. Other scattered ground-glass nodules appears stable including 7 mm nodules on image number 40/4 in the left upper lobe. 7 mm lesion in the right upper lobe on image number 54/4 is stable also. No new pulmonary lesions no pleural effusions or pleural nodules. Musculoskeletal: No breast masses, supraclavicular or axillary adenopathy. No bone lesions. Remote T5 compression fracture. CT ABDOMEN PELVIS FINDINGS Hepatobiliary: No hepatic lesions are identified without contrast. No intrahepatic biliary dilatation. The gallbladder is surgically absent. No common bile duct dilatation. Pancreas: Normal Spleen: Normal Adrenals/Urinary Tract: The adrenal glands are normal. No renal lesions, renal calculi or hydronephrosis. The bladder is unremarkable. Stomach/Bowel: The stomach, duodenum, small bowel and colon are grossly normal without oral contrast. No inflammatory changes, mass lesions or obstructive  findings. The appendix is normal. Vascular/Lymphatic: Stable atherosclerotic calcifications involving the aorta and iliac arteries but no aneurysm. No mesenteric or retroperitoneal mass or adenopathy. Reproductive: The uterus is surgically absent. Both ovaries are still present and appear normal. Other: No pelvic mass or adenopathy. No free pelvic fluid collections. No inguinal mass or adenopathy. No abdominal wall hernia or subcutaneous lesions. Musculoskeletal: No significant bony findings. IMPRESSION: 1. Stable mixed solid and sub solid lesion  in the right upper lobe. It appears slightly more solid superiorly. Recommend continued close surveillance. Recommend follow-up noncontrast chest CT in 3-6 months 2. Stable scattered ground-glass nodules in both lungs. Recommend continued surveillance. 3. No findings for abdominal/pelvic metastatic disease. Aortic Atherosclerosis (ICD10-I70.0) and Emphysema (ICD10-J43.9). Electronically Signed   By: Rudie Meyer M.D.   On: 10/09/2022 09:29     ASSESSMENT/PLAN:  This is a very pleasant 67 year old Caucasian female diagnosed with stage IV non-small cell lung cancer, adenocarcinoma. She is negative for any actionable mutations and her PD-L1 expression is 1%. She was diagnosed in December 2019. She presented with bilateral pulmonary nodules.   She initially started treatment with carboplatin, Alimta, and Keytruda. She was status post 4 cycles but Keytruda was discontinued after cycle #2 due to his significant skin rash. She then was treated with maintenance single agent Alimta for 3 cycles. This has been on hold since June 2020 due to worsening renal insufficiency.   She has been on observation since that time and feeling fairly well except she does have significant COPD for which she is on supplemental oxygen. She follows closely with pulmonology.    In November 2023 the patient had a repeat CT scan which showed new area of nodularity with potential endobronchial  extension and lymph node adjacent to the endobronchial material in the right hilu that is suspicious and a new solid nodule in the medial right lower lobe concerning for additional site of disease.   She had a PET scan which showed evidence of disease progression with multiple bilateral solid and subsolid nodules.   Due to the patient's history of intolerance to chemotherapy she was started on second line targeted treatment with Krazati 600 mg twice daily.  Her first dose was on 02/20/22.  Dose was reduced to 400 mg twice daily around 03/03/2022 due to ongoing symptoms of nausea, vomiting, and diarrhea. She resumed this on 05/07/22.   The patient was seen with Dr. Arbutus Ped today.  The patient recently had a restaging CT scan performed.  Dr. Arbutus Ped personally and independently reviewed the scan and discussed the results with the patient today.  The scan showed stable mixed solid and subsolid lesion in the right upper lobe which may be slightly more solid superiorly.  Dr. Arbutus Ped recommends continuing on the same treatment with close monitoring.  Her other stable scattered groundglass nodules are unchanged.  And there is no findings of abdominal or pelvic metastatic disease.    Dr. Arbutus Ped recommends that she continue on the same treatment at the same dose.  We will see her back for a follow-up visit in 6 weeks for evaluation and repeat blood work  We will arrange for urinalysis to be performed today to rule out urinary tract infection.  She has an upcoming appointment with pulmonary medicine.  Unfortunately the patient does have emphysema and she is on supplemental oxygen all the time.  They have previously talked to her about pulmonary rehab but she has some transportation constraints.  She will revisit her options and recommendations at her upcoming appointment in September.  She will continue with her antiemetic for her intermittent nausea and vomiting.  She will continue to use Imodium if needed  for diarrhea.  Regarding her mild hoarseness, I discussed the next step may be following back up with her ENT.  The patient had a bad experience in the past and is not interested in seeing ENT at this time.  The patient was advised to call immediately if  she has any concerning symptoms in the interval. The patient voices understanding of current disease status and treatment options and is in agreement with the current care plan. All questions were answered. The patient knows to call the clinic with any problems, questions or concerns. We can certainly see the patient much sooner if necessary   No orders of the defined types were placed in this encounter.     Donavon Kimrey L Adrijana Haros, PA-C 10/20/22  ADDENDUM: Hematology/Oncology Attending: I had a face-to-face encounter with the patient today.  I reviewed her records, lab, scan and recommended her care plan.  This is a very pleasant 67 years old white female with stage IV non-small cell lung cancer, adenocarcinoma diagnosed in December 2019 with positive KRAS G12C mutation and PD-L1 expression of 1%.  The patient started systemic chemotherapy initially with carboplatin, Alimta and Keytruda for 2 cycle.  Rande Lawman was discontinued starting from cycle #3 and the patient continued on 2 more cycle of carboplatin and Alimta followed by 3 cycles of maintenance single agent Alimta discontinued in June 2020 secondary to renal insufficiency.  She had stable disease for more than 2 years before she had evidence for disease progression and the patient started treatment with [name] (Adagrasib) initially at 600 mg p.o. twice daily on February 20, 2022 and then her dose was reduced to 400 mg p.o. twice daily on March 10, 2022 and she has been tolerating this treatment fairly well.  She had repeat CT scan of the chest, abdomen and pelvis performed recently.  I personally and independently reviewed the scan and discussed the result with the patient today. Her  scan showed no concerning findings for disease progression except for a subsolid nodule in the right upper lobe that is slightly more solid. I recommended for the patient to continue her current treatment with [name] (Adagrasib) with the same dose. She will come back for follow-up visit in 6 weeks for evaluation and repeat blood work. The patient was advised to call immediately if she has any other concerning symptoms in the interval. The total time spent in the appointment was 30 minutes. Disclaimer: This note was dictated with voice recognition software. Similar sounding words can inadvertently be transcribed and may be missed upon review. Lajuana Matte, MD

## 2022-10-22 ENCOUNTER — Telehealth: Payer: Self-pay | Admitting: Physician Assistant

## 2022-10-22 ENCOUNTER — Inpatient Hospital Stay: Payer: Managed Care, Other (non HMO)

## 2022-10-22 ENCOUNTER — Inpatient Hospital Stay: Payer: Managed Care, Other (non HMO) | Admitting: Physician Assistant

## 2022-10-22 VITALS — BP 142/86 | HR 97 | Temp 97.9°F | Resp 16 | Wt 191.9 lb

## 2022-10-22 DIAGNOSIS — C3491 Malignant neoplasm of unspecified part of right bronchus or lung: Secondary | ICD-10-CM | POA: Insufficient documentation

## 2022-10-22 DIAGNOSIS — J449 Chronic obstructive pulmonary disease, unspecified: Secondary | ICD-10-CM | POA: Insufficient documentation

## 2022-10-22 DIAGNOSIS — R490 Dysphonia: Secondary | ICD-10-CM | POA: Insufficient documentation

## 2022-10-22 DIAGNOSIS — R300 Dysuria: Secondary | ICD-10-CM | POA: Diagnosis not present

## 2022-10-22 DIAGNOSIS — N289 Disorder of kidney and ureter, unspecified: Secondary | ICD-10-CM | POA: Diagnosis not present

## 2022-10-22 DIAGNOSIS — R112 Nausea with vomiting, unspecified: Secondary | ICD-10-CM | POA: Insufficient documentation

## 2022-10-22 DIAGNOSIS — C3492 Malignant neoplasm of unspecified part of left bronchus or lung: Secondary | ICD-10-CM

## 2022-10-22 DIAGNOSIS — Z9981 Dependence on supplemental oxygen: Secondary | ICD-10-CM | POA: Insufficient documentation

## 2022-10-22 DIAGNOSIS — J439 Emphysema, unspecified: Secondary | ICD-10-CM | POA: Diagnosis not present

## 2022-10-22 DIAGNOSIS — C3490 Malignant neoplasm of unspecified part of unspecified bronchus or lung: Secondary | ICD-10-CM

## 2022-10-22 LAB — CMP (CANCER CENTER ONLY)
ALT: 24 U/L (ref 0–44)
AST: 20 U/L (ref 15–41)
Albumin: 4.2 g/dL (ref 3.5–5.0)
Alkaline Phosphatase: 73 U/L (ref 38–126)
Anion gap: 9 (ref 5–15)
BUN: 23 mg/dL (ref 8–23)
CO2: 23 mmol/L (ref 22–32)
Calcium: 9.2 mg/dL (ref 8.9–10.3)
Chloride: 107 mmol/L (ref 98–111)
Creatinine: 1.31 mg/dL — ABNORMAL HIGH (ref 0.44–1.00)
GFR, Estimated: 45 mL/min — ABNORMAL LOW (ref >60)
Glucose, Bld: 133 mg/dL — ABNORMAL HIGH (ref 70–99)
Potassium: 4.3 mmol/L (ref 3.5–5.1)
Sodium: 139 mmol/L (ref 135–145)
Total Bilirubin: 0.7 mg/dL (ref 0.3–1.2)
Total Protein: 7.5 g/dL (ref 6.5–8.1)

## 2022-10-22 LAB — URINALYSIS, COMPLETE (UACMP) WITH MICROSCOPIC
Bilirubin Urine: NEGATIVE
Glucose, UA: NEGATIVE mg/dL
Hgb urine dipstick: NEGATIVE
Ketones, ur: NEGATIVE mg/dL
Leukocytes,Ua: NEGATIVE
Nitrite: NEGATIVE
Protein, ur: NEGATIVE mg/dL
Specific Gravity, Urine: 1.024 (ref 1.005–1.030)
pH: 5 (ref 5.0–8.0)

## 2022-10-22 LAB — CBC WITH DIFFERENTIAL (CANCER CENTER ONLY)
Abs Immature Granulocytes: 0.02 10*3/uL (ref 0.00–0.07)
Basophils Absolute: 0.1 10*3/uL (ref 0.0–0.1)
Basophils Relative: 1 %
Eosinophils Absolute: 0.2 10*3/uL (ref 0.0–0.5)
Eosinophils Relative: 3 %
HCT: 39.8 % (ref 36.0–46.0)
Hemoglobin: 13.5 g/dL (ref 12.0–15.0)
Immature Granulocytes: 0 %
Lymphocytes Relative: 14 %
Lymphs Abs: 1.1 10*3/uL (ref 0.7–4.0)
MCH: 30.4 pg (ref 26.0–34.0)
MCHC: 33.9 g/dL (ref 30.0–36.0)
MCV: 89.6 fL (ref 80.0–100.0)
Monocytes Absolute: 0.6 10*3/uL (ref 0.1–1.0)
Monocytes Relative: 8 %
Neutro Abs: 5.5 10*3/uL (ref 1.7–7.7)
Neutrophils Relative %: 74 %
Platelet Count: 213 10*3/uL (ref 150–400)
RBC: 4.44 MIL/uL (ref 3.87–5.11)
RDW: 14.1 % (ref 11.5–15.5)
WBC Count: 7.5 10*3/uL (ref 4.0–10.5)
nRBC: 0 % (ref 0.0–0.2)

## 2022-10-22 NOTE — Telephone Encounter (Signed)
I called her to review her UA which was negative for UTI. Encouraged her to drink plenty of water.

## 2022-10-23 LAB — URINE CULTURE: Culture: NO GROWTH

## 2022-11-05 ENCOUNTER — Ambulatory Visit: Payer: Managed Care, Other (non HMO) | Admitting: Pulmonary Disease

## 2022-11-10 ENCOUNTER — Other Ambulatory Visit (HOSPITAL_COMMUNITY): Payer: Self-pay

## 2022-11-12 ENCOUNTER — Other Ambulatory Visit (HOSPITAL_COMMUNITY): Payer: Self-pay

## 2022-11-20 ENCOUNTER — Telehealth: Payer: Self-pay | Admitting: Cardiovascular Disease

## 2022-11-20 MED ORDER — METOPROLOL TARTRATE 25 MG PO TABS: 12.5000 mg | ORAL_TABLET | Freq: Two times a day (BID) | ORAL | 3 refills | Status: AC

## 2022-11-20 NOTE — Telephone Encounter (Signed)
Patient called to see if she can get Dr. Clifton James to refill her metoprolol due to high heart rate. Please call back to discuss

## 2022-11-20 NOTE — Telephone Encounter (Signed)
Called and spoke w the patient.  She used to be on metoprolol but it was stopped at hospital discharge in Feb.  She was on 12.5 mg BID.  She is living w stage 4 lung cancer and anytime she moves around her HR jumps up into 130s and with rest it does come down to around 90-100.  If okay with Dr. Clifton James she would like to restart it to see if it will help her HR.

## 2022-11-20 NOTE — Telephone Encounter (Signed)
OK with me. THanks, chris

## 2022-12-03 ENCOUNTER — Other Ambulatory Visit: Payer: Managed Care, Other (non HMO)

## 2022-12-03 ENCOUNTER — Inpatient Hospital Stay: Payer: Managed Care, Other (non HMO)

## 2022-12-03 ENCOUNTER — Inpatient Hospital Stay: Payer: Managed Care, Other (non HMO) | Attending: Physician Assistant | Admitting: Internal Medicine

## 2022-12-11 ENCOUNTER — Other Ambulatory Visit: Payer: Self-pay

## 2022-12-14 ENCOUNTER — Encounter: Payer: Self-pay | Admitting: Internal Medicine

## 2022-12-14 ENCOUNTER — Other Ambulatory Visit (HOSPITAL_COMMUNITY): Payer: Self-pay

## 2022-12-14 ENCOUNTER — Ambulatory Visit: Payer: Managed Care, Other (non HMO) | Admitting: Pulmonary Disease

## 2022-12-14 NOTE — Telephone Encounter (Signed)
error 

## 2022-12-16 ENCOUNTER — Other Ambulatory Visit (HOSPITAL_COMMUNITY): Payer: Self-pay

## 2022-12-18 ENCOUNTER — Other Ambulatory Visit: Payer: Self-pay

## 2022-12-18 ENCOUNTER — Ambulatory Visit: Payer: Managed Care, Other (non HMO) | Admitting: Pulmonary Disease

## 2022-12-18 ENCOUNTER — Other Ambulatory Visit: Payer: Self-pay | Admitting: Physician Assistant

## 2022-12-18 ENCOUNTER — Other Ambulatory Visit (HOSPITAL_COMMUNITY): Payer: Self-pay

## 2022-12-18 MED ORDER — KRAZATI 200 MG PO TABS
400.0000 mg | ORAL_TABLET | Freq: Two times a day (BID) | ORAL | 3 refills | Status: DC
  Filled 2022-12-18: qty 120, 30d supply, fill #0
  Filled 2023-01-13: qty 120, 30d supply, fill #1
  Filled 2023-02-08: qty 120, 30d supply, fill #2
  Filled 2023-03-04: qty 120, 30d supply, fill #3
  Filled 2023-03-31: qty 120, 30d supply, fill #4
  Filled 2023-04-30: qty 120, 30d supply, fill #5

## 2022-12-18 NOTE — Progress Notes (Signed)
Specialty Pharmacy Refill Coordination Note  [name] is a 67 y.o. female contacted today regarding refills of specialty medication(s) Adagrasib   Patient requested Delivery   Delivery date: 12/22/22   Verified address: [address]   [place] [address]   Medication will be filled on 12/21/22 pending refill request.

## 2022-12-28 ENCOUNTER — Telehealth: Payer: Self-pay | Admitting: Pharmacy Technician

## 2022-12-28 ENCOUNTER — Other Ambulatory Visit (HOSPITAL_COMMUNITY): Payer: Self-pay

## 2022-12-28 NOTE — Telephone Encounter (Signed)
Oral Oncology Patient Advocate Encounter  Prior Authorization for [name] has been approved.    PA# [id number] Effective dates: 12/28/22 through 06/26/23  Patient may continue to fill at [facility].    Jinger Neighbors, CPhT-Adv Oncology Pharmacy Patient Advocate [facility] Direct Number: [phone]  Fax: [phone]

## 2022-12-28 NOTE — Telephone Encounter (Signed)
Oral Oncology Patient Advocate Encounter   Received notification that prior authorization for [name] is due for renewal.   PA submitted on 12/28/22 Key [id number] Status is pending     Jinger Neighbors, CPhT-Adv Oncology Pharmacy Patient Advocate [facility] Direct Number: [phone]  Fax: [phone]

## 2023-01-07 ENCOUNTER — Other Ambulatory Visit: Payer: Self-pay

## 2023-01-08 ENCOUNTER — Other Ambulatory Visit: Payer: Self-pay

## 2023-01-09 ENCOUNTER — Telehealth: Payer: Self-pay | Admitting: Internal Medicine

## 2023-01-09 NOTE — Telephone Encounter (Signed)
Returned patient's phone call regarding rescheduling cancelled appointments. Rescheduled for 12/04 per patient's request.

## 2023-01-11 ENCOUNTER — Other Ambulatory Visit (HOSPITAL_COMMUNITY): Payer: Self-pay

## 2023-01-11 ENCOUNTER — Encounter (HOSPITAL_COMMUNITY): Payer: Self-pay

## 2023-01-13 ENCOUNTER — Other Ambulatory Visit (HOSPITAL_COMMUNITY): Payer: Self-pay

## 2023-01-13 ENCOUNTER — Other Ambulatory Visit: Payer: Self-pay

## 2023-01-13 NOTE — Progress Notes (Signed)
Specialty Pharmacy Refill Coordination Note  [name] is a 67 y.o. female contacted today regarding refills of specialty medication(s) Adagrasib   Patient requested Delivery   Delivery date: 01/15/23   Verified address: [address] [place] [address]   Medication will be filled on 01/14/23.

## 2023-01-14 ENCOUNTER — Other Ambulatory Visit: Payer: Self-pay

## 2023-02-01 NOTE — Progress Notes (Unsigned)
[facility] OFFICE PROGRESS NOTE  [name], NP [address] [place] [address]  DIAGNOSIS: Stage IV (T1a, N0, M1 a) non-small cell lung cancer, adenocarcinoma presented with multifocal disease in both lungs including 3 nodules in the left lung as well as 2 nodules in the right lung diagnosed in December 2019.     Molecular studies showed: KRAS G12C BCOR N1425S-subclonal RAD21 amplification   PDL 1 expression 1%.  PRIOR THERAPY: 1) Systemic chemotherapy with carboplatin for AUC of 5, Alimta 500 mg/M2 and Keytruda 200 mg IV every 3 weeks.  First dose April 12, 2018. Status post 4 cycles. starting from cycle #3, Keytruda was dropped and patient received treatment with Carboplatin for an AUC of 5 and Alimta 500 mg/m2 IV every 3 weeks.  2) Systemic chemotherapy with Alimta 500 mg/m2 IV every 3 weeks. Status post 3 cycles of single agent Alimta. Most recent dose given on 08/23/2018.  The treatment is currently on hold secondary to renal insufficiency  CURRENT THERAPY: Krazati 600 mg BID, first dose on 02/20/22. Dose reduced to 400 mg BID starting on 03/10/22. However, the patient states this was on hold since she was in the hospital on 03/15/22 and she had not resumed it until 05/07/22.   INTERVAL HISTORY: [name] 67 y.o. female returns to clinic today for follow-up visit.  The patient was lost to follow-up since August 2024.  She was seen on 10/22/2022 with the follow-up instructions of 6 weeks.  The patient states she had a new great grandchild born and she has been in [place], [place].  She has some transportation constraints.  She does not drive.   She is currently on dose reduced targeted treatment with [place].  She is tolerating this fairly well without any appreciable side effects.  She in general has a lot of unusual symptoms that have been ongoing even since prior to starting [place].  For example, she has been having nausea and vomiting and intermittent  diarrhea even when off treatment and on observation for several years.  The diarrhea started after having her gallbladder removed. She has not seen any appreciable change in the frequency and severity since being on [place].  Patient's only new concern is that she had caught a cold from her husband approximately 2 weeks ago.  She had bodyaches, chills, coughing, and some increase in her baseline shortness of breath.  Overall her symptoms are improving.  Her cough has greatly improved and she uses Robitussin but overall is not back to baseline.  Additionally her dyspnea which she already has at baseline with minimal exertion is not quite back to where it was prior to her cold but is still moving in the right direction.   She is here today for evaluation repeat blood work.   MEDICAL HISTORY: Past Medical History:  Diagnosis Date   Anemia    "long time ago" (12/06/2012)   Anxiety    Arthritis    "right knee real bad; in my hands bad" (12/06/2012)   Asthma    Celiac disease    Colon polyps    COPD (chronic obstructive pulmonary disease) (HCC)    Coronary artery disease    a. s/p Xience DES to [facility] 04/2008;  b. LHC 05/2008: Proximal RCA 25%, mid RCA stent patent.  c. Anormal nuc 2014 -> s/p LHC with severe mRCA stenosis s/p DES. d. Cath 04/2013 s/p DES to LAD.  e. LHC 08/2015 was stable.   Depression    "  years ago" (12/06/2012)   Fibromyalgia    Gallstones    GERD (gastroesophageal reflux disease)    H/O hiatal hernia    Hepatitis    "not A, B, or C" (12/06/2012)   Hyperlipidemia    intol to statins and other chol agents due to elevated LFTs   Hypertension    Hypothyroidism    IBS (irritable bowel syndrome)    Interstitial cystitis    Lung cancer (HCC) dx'd 01/2018   Lung nodules    Bilateral   Migraines    "when I was younger" (12/06/2012)   Pneumonia    PONV (postoperative nausea and vomiting)    "Very bad"   Premature atrial contractions    PVC's (premature ventricular contractions)     Sinus headache    SVT (supraventricular tachycardia)     ALLERGIES:  is allergic to ciprofloxacin, levofloxacin, statins, tricor [fenofibrate], compazine [prochlorperazine], latex, codeine, and metoprolol.  MEDICATIONS:  Current Outpatient Medications  Medication Sig Dispense Refill   adagrasib (KRAZATI) 200 MG tablet Take 2 tablets (400 mg total) by mouth 2 (two) times daily. 180 tablet 3   albuterol (VENTOLIN HFA) 108 (90 Base) MCG/ACT inhaler Inhale 1 puff into the lungs every 6 (six) hours as needed (wheezing/shortness of breath.).      aspirin 81 MG chewable tablet Chew 1 tablet (81 mg total) by mouth daily.     Budeson-Glycopyrrol-Formoterol (BREZTRI AEROSPHERE) 160-9-4.8 MCG/ACT AERO Inhale 2 puffs into the lungs in the morning and at bedtime. 10.7 g 5   buPROPion (WELLBUTRIN XL) 150 MG 24 hr tablet Take 1-2 tablets by mouth 2 (two) times daily. Patient takes 2 tablets (300mg ) in the morning and 1 tablet (150mg ) in the evening     doxycycline (VIBRA-TABS) 100 MG tablet Take 1 tablet (100 mg total) by mouth 2 (two) times daily. 20 tablet 0   ferrous sulfate (FEROSUL) 325 (65 FE) MG tablet TAKE 1 TABLET(325 MG) BY MOUTH DAILY WITH BREAKFAST 30 tablet 2   gabapentin (NEURONTIN) 100 MG capsule TAKE 1 CAPSULE(100 MG) BY MOUTH THREE TIMES DAILY. 90 capsule 2   ipratropium-albuterol (DUONEB) 0.5-2.5 (3) MG/3ML SOLN Inhale 3 mLs into the lungs every 6 (six) hours as needed (Asthma). 360 mL 3   loperamide (IMODIUM A-D) 2 MG tablet Take 1 tablet (2 mg total) by mouth 4 (four) times daily as needed for diarrhea or loose stools. 20 tablet 0   LORazepam (ATIVAN) 2 MG tablet Take 2 mg by mouth every evening.     metoprolol tartrate (LOPRESSOR) 25 MG tablet Take 0.5 tablets (12.5 mg total) by mouth 2 (two) times daily. 90 tablet 3   nitroGLYCERIN (NITROSTAT) 0.4 MG SL tablet Place 1 tablet (0.4 mg total) under the tongue every 5 (five) minutes as needed for chest pain. 25 tablet 12   ondansetron  (ZOFRAN-ODT) 4 MG disintegrating tablet Take 1 tablet (4 mg total) by mouth every 8 (eight) hours as needed for nausea or vomiting. 30 tablet 2   albuterol (PROVENTIL) (5 MG/ML) 0.5% nebulizer solution Take 2.5 mg by nebulization every 6 (six) hours as needed for wheezing.     No current facility-administered medications for this visit.    SURGICAL HISTORY:  Past Surgical History:  Procedure Laterality Date   CARDIAC CATHETERIZATION  04/2008; 05/2008   CARDIAC CATHETERIZATION N/A 08/07/2015   Procedure: Left Heart Cath and Coronary Angiography;  Surgeon: Tonny Bollman, MD;  Location: [facility] INVASIVE CV LAB;  Service: Cardiovascular;  Laterality: N/A;   CHOLECYSTECTOMY  CORONARY ANGIOPLASTY WITH STENT PLACEMENT  04/2008 12/06/2012   "1 + 1" (12/06/2012)   CORONARY STENT PLACEMENT  05/08/2013   DES TO LAD      DR MCALHANY   IR IMAGING GUIDED PORT INSERTION  04/29/2018   LEFT HEART CATHETERIZATION WITH CORONARY ANGIOGRAM N/A 12/06/2012   Procedure: LEFT HEART CATHETERIZATION WITH CORONARY ANGIOGRAM;  Surgeon: Kathleene Hazel, MD;  Location: [facility] C[facility] LAB;  Service: Cardiovascular;  Laterality: N/A;   LEFT HEART CATHETERIZATION WITH CORONARY ANGIOGRAM N/A 05/08/2013   Procedure: LEFT HEART CATHETERIZATION WITH CORONARY ANGIOGRAM;  Surgeon: Kathleene Hazel, MD;  Location: [facility] CATH LAB;  Service: Cardiovascular;  Laterality: N/A;   LEFT HEART CATHETERIZATION WITH CORONARY ANGIOGRAM N/A 01/04/2014   Procedure: LEFT HEART CATHETERIZATION WITH CORONARY ANGIOGRAM;  Surgeon: Kathleene Hazel, MD;  Location: [facility] CATH LAB;  Service: Cardiovascular;  Laterality: N/A;   PERCUTANEOUS CORONARY STENT INTERVENTION (PCI-S)  12/06/2012   Procedure: PERCUTANEOUS CORONARY STENT INTERVENTION (PCI-S);  Surgeon: Kathleene Hazel, MD;  Location: [facility] CATH LAB;  Service: Cardiovascular;;   PERCUTANEOUS CORONARY STENT INTERVENTION (PCI-S)  05/08/2013   Procedure: PERCUTANEOUS CORONARY STENT INTERVENTION (PCI-S);   Surgeon: Kathleene Hazel, MD;  Location: [facility] CATH LAB;  Service: Cardiovascular;;  mid LAD    TUBAL LIGATION     VAGINAL HYSTERECTOMY     VIDEO BRONCHOSCOPY WITH ENDOBRONCHIAL NAVIGATION N/A 02/17/2018   Procedure: VIDEO BRONCHOSCOPY WITH ENDOBRONCHIAL NAVIGATION;  Surgeon: Loreli Slot, MD;  Location: [facility] OR;  Service: Thoracic;  Laterality: N/A;    REVIEW OF SYSTEMS:   Constitutional: + chronic fatigue and chills when she had cold 2 weeks ago. negative for appetite change, fever and unexpected weight change.  HENT: Negative for mouth sores, nosebleeds, sore throat and trouble swallowing.   Eyes: Negative for eye problems and icterus.  Respiratory: Positive for chronic dyspnea on exertion with minimal exertion. Positive for cough since being sick but improving. Negative for  hemoptysis,  and wheezing.   Cardiovascular: Negative for chest pain and leg swelling.  Gastrointestinal: Positive for intermittent nausea, vomiting, and diarrhea.  Negative for  constipation. Genitourinary: Negative for bladder incontinence, difficulty urinating, dysuria, frequency and hematuria.   Musculoskeletal: positive for chronic back pain.  Negative for gait problem, neck pain and neck stiffness.  Skin: Negative for itching and rash.  Neurological: Positive for poor balance.  Negative for dizziness, extremity weakness, gait problem, headaches, light-headedness and seizures.  Hematological: Negative for adenopathy. Does not bruise/bleed easily.  Psychiatric/Behavioral: Negative for confusion, depression and sleep disturbance. The patient is not nervous/anxious.     PHYSICAL EXAMINATION:  Blood pressure 139/77, pulse 71, temperature 99.4 F (37.4 C), temperature source Temporal, resp. rate 19, height 5\' 7"  (1.702 m), weight 192 lb 3 oz (87.2 kg), SpO2 94%.  ECOG PERFORMANCE STATUS: 2  Physical Exam  Constitutional: Oriented to person, place, and time and chronically ill-appearing female and in no  distress.  HENT:  Head: Normocephalic and atraumatic.  Mouth/Throat: Oropharynx is clear and moist. No oropharyngeal exudate.  Eyes: Conjunctivae are normal. Right eye exhibits no discharge. Left eye exhibits no discharge. No scleral icterus.  Neck: Normal range of motion. Neck supple.  Cardiovascular: Normal rate, regular rhythm, normal heart sounds and intact distal pulses.   Pulmonary/Chest: Effort normal.  Quiet breath sounds bilaterally.  On supplemental oxygen.  No respiratory distress. No wheezes. No rales.  Abdominal: Soft. Bowel sounds are normal. Exhibits no distension and no mass. There is no tenderness.  Musculoskeletal: Normal  range of motion. Exhibits no edema.  Lymphadenopathy:    No cervical adenopathy.  Neurological: Alert and oriented to person, place, and time. Exhibits muscle wasting. Examined in the wheelchair.   Skin: Skin is warm and dry. No rash noted. Not diaphoretic. No erythema. No pallor.  Psychiatric: Mood, memory and judgment normal.   LABORATORY DATA: Lab Results  Component Value Date   WBC 7.4 02/03/2023   HGB 12.7 02/03/2023   HCT 38.7 02/03/2023   MCV 95.3 02/03/2023   PLT 225 02/03/2023      Chemistry      Component Value Date/Time   NA 142 02/03/2023 1457   NA 143 01/10/2018 1629   K 4.7 02/03/2023 1457   CL 106 02/03/2023 1457   CO2 32 02/03/2023 1457   BUN 18 02/03/2023 1457   BUN 14 01/10/2018 1629   CREATININE 1.15 (H) 02/03/2023 1457   CREATININE 0.68 08/05/2015 1456      Component Value Date/Time   CALCIUM 9.6 02/03/2023 1457   ALKPHOS 75 02/03/2023 1457   AST 17 02/03/2023 1457   ALT 24 02/03/2023 1457   BILITOT 0.7 02/03/2023 1457       RADIOGRAPHIC STUDIES:  No results found.   ASSESSMENT/PLAN:  This is a very pleasant 67 year old Caucasian female diagnosed with stage IV non-small cell lung cancer, adenocarcinoma. She is negative for any actionable mutations and her PD-L1 expression is 1%. She was diagnosed in  December 2019. She presented with bilateral pulmonary nodules.    She initially started treatment with carboplatin, Alimta, and Keytruda. She was status post 4 cycles but Keytruda was discontinued after cycle #2 due to his significant skin rash. She then was treated with maintenance single agent Alimta for 3 cycles. This has been on hold since June 2020 due to worsening renal insufficiency.  She has been on observation since that time and feeling fairly well except she does have significant COPD for which she is on supplemental oxygen. She follows closely with pulmonology.   In November 2023 the patient had a repeat CT scan which showed new area of nodularity with potential endobronchial extension and lymph node adjacent to the endobronchial material in the right hilu that is suspicious and a new solid nodule in the medial right lower lobe concerning for additional site of disease.   She had a PET scan which showed evidence of disease progression with multiple bilateral solid and subsolid nodules.   Due to the patient's history of intolerance to chemotherapy she was started on second line targeted treatment with Krazati 600 mg twice daily.  Her first dose was on 02/20/22.  Dose was reduced to 400 mg twice daily around 03/03/2022 due to ongoing symptoms of nausea, vomiting, and diarrhea. She resumed this on 05/07/22.  She was lost to follow-up since August 2024 due to being in [facility] for the birth of her great grandchild.   Recommend that she has a restaging CT scan of the chest, abdomen, and pelvis in the next few days/week  Will then see her back for follow-up visit after her restaging scan in approximately 2 weeks.  Given that she has significant underlying COPD, I did send her antibiotic should there be a bacterial component to her recent infection.  Overall her symptoms do seem to be improving.  She got sick from her husband about 2 weeks ago.  Patient had checked herself for  COVID which was negative. We will assess her lungs on her upcoming CT scan.  She will continue with her antiemetic for her intermittent nausea and vomiting.   She will continue to use Imodium if needed for diarrhea.  The patient was advised to call immediately if she has any concerning symptoms in the interval. The patient voices understanding of current disease status and treatment options and is in agreement with the current care plan. All questions were answered. The patient knows to call the clinic with any problems, questions or concerns. We can certainly see the patient much sooner if necessary       Orders Placed This Encounter  Procedures   CT CHEST ABDOMEN PELVIS W CONTRAST    Standing Status:   Future    Standing Expiration Date:   02/03/2024    Order Specific Question:   If indicated for the ordered procedure, I authorize the administration of contrast media per Radiology protocol    Answer:   Yes    Order Specific Question:   Does the patient have a contrast media/X-ray dye allergy?    Answer:   No    Order Specific Question:   Preferred imaging location?    Answer:   [facility]    Order Specific Question:   If indicated for the ordered procedure, I authorize the administration of oral contrast media per Radiology protocol    Answer:   Yes   CBC with Differential (Cancer Center Only)    Standing Status:   Future    Standing Expiration Date:   02/03/2024   CMP (Cancer Center only)    Standing Status:   Future    Standing Expiration Date:   02/03/2024      The total time spent in the appointment was 20-29 minutes  Sherese Heyward L Brice Potteiger, PA-C 02/03/23

## 2023-02-02 ENCOUNTER — Other Ambulatory Visit (HOSPITAL_COMMUNITY): Payer: Self-pay

## 2023-02-03 ENCOUNTER — Inpatient Hospital Stay (HOSPITAL_BASED_OUTPATIENT_CLINIC_OR_DEPARTMENT_OTHER): Payer: Managed Care, Other (non HMO) | Admitting: Physician Assistant

## 2023-02-03 ENCOUNTER — Inpatient Hospital Stay: Payer: Managed Care, Other (non HMO) | Attending: Physician Assistant

## 2023-02-03 VITALS — BP 139/77 | HR 71 | Temp 99.4°F | Resp 19 | Ht 67.0 in | Wt 192.2 lb

## 2023-02-03 DIAGNOSIS — Z95828 Presence of other vascular implants and grafts: Secondary | ICD-10-CM

## 2023-02-03 DIAGNOSIS — C3492 Malignant neoplasm of unspecified part of left bronchus or lung: Secondary | ICD-10-CM

## 2023-02-03 DIAGNOSIS — Z9981 Dependence on supplemental oxygen: Secondary | ICD-10-CM | POA: Diagnosis not present

## 2023-02-03 DIAGNOSIS — J449 Chronic obstructive pulmonary disease, unspecified: Secondary | ICD-10-CM | POA: Insufficient documentation

## 2023-02-03 DIAGNOSIS — K529 Noninfective gastroenteritis and colitis, unspecified: Secondary | ICD-10-CM | POA: Diagnosis not present

## 2023-02-03 DIAGNOSIS — E860 Dehydration: Secondary | ICD-10-CM | POA: Insufficient documentation

## 2023-02-03 DIAGNOSIS — K219 Gastro-esophageal reflux disease without esophagitis: Secondary | ICD-10-CM | POA: Diagnosis not present

## 2023-02-03 DIAGNOSIS — R112 Nausea with vomiting, unspecified: Secondary | ICD-10-CM | POA: Diagnosis not present

## 2023-02-03 DIAGNOSIS — R059 Cough, unspecified: Secondary | ICD-10-CM

## 2023-02-03 DIAGNOSIS — N289 Disorder of kidney and ureter, unspecified: Secondary | ICD-10-CM | POA: Insufficient documentation

## 2023-02-03 DIAGNOSIS — R296 Repeated falls: Secondary | ICD-10-CM | POA: Diagnosis not present

## 2023-02-03 DIAGNOSIS — Z9049 Acquired absence of other specified parts of digestive tract: Secondary | ICD-10-CM | POA: Diagnosis not present

## 2023-02-03 DIAGNOSIS — C3491 Malignant neoplasm of unspecified part of right bronchus or lung: Secondary | ICD-10-CM | POA: Insufficient documentation

## 2023-02-03 LAB — CBC WITH DIFFERENTIAL (CANCER CENTER ONLY)
Abs Immature Granulocytes: 0.02 10*3/uL (ref 0.00–0.07)
Basophils Absolute: 0.1 10*3/uL (ref 0.0–0.1)
Basophils Relative: 1 %
Eosinophils Absolute: 0.4 10*3/uL (ref 0.0–0.5)
Eosinophils Relative: 5 %
HCT: 38.7 % (ref 36.0–46.0)
Hemoglobin: 12.7 g/dL (ref 12.0–15.0)
Immature Granulocytes: 0 %
Lymphocytes Relative: 23 %
Lymphs Abs: 1.7 10*3/uL (ref 0.7–4.0)
MCH: 31.3 pg (ref 26.0–34.0)
MCHC: 32.8 g/dL (ref 30.0–36.0)
MCV: 95.3 fL (ref 80.0–100.0)
Monocytes Absolute: 0.6 10*3/uL (ref 0.1–1.0)
Monocytes Relative: 8 %
Neutro Abs: 4.7 10*3/uL (ref 1.7–7.7)
Neutrophils Relative %: 63 %
Platelet Count: 225 10*3/uL (ref 150–400)
RBC: 4.06 MIL/uL (ref 3.87–5.11)
RDW: 13.8 % (ref 11.5–15.5)
WBC Count: 7.4 10*3/uL (ref 4.0–10.5)
nRBC: 0 % (ref 0.0–0.2)

## 2023-02-03 LAB — CMP (CANCER CENTER ONLY)
ALT: 24 U/L (ref 0–44)
AST: 17 U/L (ref 15–41)
Albumin: 4.3 g/dL (ref 3.5–5.0)
Alkaline Phosphatase: 75 U/L (ref 38–126)
Anion gap: 4 — ABNORMAL LOW (ref 5–15)
BUN: 18 mg/dL (ref 8–23)
CO2: 32 mmol/L (ref 22–32)
Calcium: 9.6 mg/dL (ref 8.9–10.3)
Chloride: 106 mmol/L (ref 98–111)
Creatinine: 1.15 mg/dL — ABNORMAL HIGH (ref 0.44–1.00)
GFR, Estimated: 52 mL/min — ABNORMAL LOW (ref >60)
Glucose, Bld: 108 mg/dL — ABNORMAL HIGH (ref 70–99)
Potassium: 4.7 mmol/L (ref 3.5–5.1)
Sodium: 142 mmol/L (ref 135–145)
Total Bilirubin: 0.7 mg/dL (ref <1.2)
Total Protein: 6.7 g/dL (ref 6.5–8.1)

## 2023-02-03 MED ORDER — HEPARIN SOD (PORK) LOCK FLUSH 100 UNIT/ML IV SOLN
500.0000 [IU] | Freq: Once | INTRAVENOUS | Status: AC
  Administered 2023-02-03: 500 [IU]

## 2023-02-03 MED ORDER — SODIUM CHLORIDE 0.9% FLUSH
10.0000 mL | Freq: Once | INTRAVENOUS | Status: AC
  Administered 2023-02-03: 10 mL

## 2023-02-03 MED ORDER — DOXYCYCLINE HYCLATE 100 MG PO TABS: 100.0000 mg | ORAL_TABLET | Freq: Two times a day (BID) | ORAL | 0 refills | Status: DC

## 2023-02-04 ENCOUNTER — Other Ambulatory Visit: Payer: Self-pay

## 2023-02-08 ENCOUNTER — Other Ambulatory Visit (HOSPITAL_COMMUNITY): Payer: Self-pay

## 2023-02-08 ENCOUNTER — Other Ambulatory Visit: Payer: Self-pay

## 2023-02-08 NOTE — Progress Notes (Signed)
Specialty Pharmacy Refill Coordination Note  OSA SCHRAG is a 67 y.o. female contacted today regarding refills of specialty medication(s) Adagrasib   Patient requested Delivery   Delivery date: 02/11/23   Verified address: [address] [place] [address]   Medication will be filled on 02/10/23.

## 2023-02-08 NOTE — Progress Notes (Signed)
Specialty Pharmacy Ongoing Clinical Assessment Note  [name] is a 67 y.o. female who is being followed by the specialty pharmacy service for RxSp Oncology   Patient's specialty medication(s) reviewed today: Adagrasib   Missed doses in the last 4 weeks: 0   Patient/Caregiver did not have any additional questions or concerns.   Therapeutic benefit summary: Patient is achieving benefit   Adverse events/side effects summary: No adverse events/side effects   Patient's therapy is appropriate to: Continue    Goals Addressed             This Visit's Progress    Slow Disease Progression       Patient is  unable to be assessed as patient was lost to follow up for awhile and is now awaiting CT results . Patient will maintain adherence         Follow up:  3 months  Servando Snare Specialty Pharmacist

## 2023-02-10 ENCOUNTER — Other Ambulatory Visit: Payer: Self-pay

## 2023-02-10 ENCOUNTER — Ambulatory Visit (HOSPITAL_COMMUNITY)
Admission: RE | Admit: 2023-02-10 | Discharge: 2023-02-10 | Disposition: A | Discharge status: Home / Self Care | Payer: Managed Care, Other (non HMO) | Source: Ambulatory Visit | Attending: Physician Assistant | Admitting: Physician Assistant

## 2023-02-10 DIAGNOSIS — C3492 Malignant neoplasm of unspecified part of left bronchus or lung: Secondary | ICD-10-CM | POA: Diagnosis present

## 2023-02-10 MED ORDER — HEPARIN SOD (PORK) LOCK FLUSH 100 UNIT/ML IV SOLN
500.0000 [IU] | Freq: Once | INTRAVENOUS | Status: AC
  Administered 2023-02-10: 500 [IU] via INTRAVENOUS

## 2023-02-10 MED ORDER — HEPARIN SOD (PORK) LOCK FLUSH 100 UNIT/ML IV SOLN
INTRAVENOUS | Status: AC
  Filled 2023-02-10: qty 5

## 2023-02-10 MED ORDER — IOHEXOL 300 MG/ML  SOLN
100.0000 mL | Freq: Once | INTRAMUSCULAR | Status: AC | PRN
  Administered 2023-02-10: 100 mL via INTRAVENOUS

## 2023-02-15 ENCOUNTER — Telehealth: Payer: Self-pay | Admitting: Internal Medicine

## 2023-02-15 NOTE — Telephone Encounter (Signed)
 Patient's spouse is aware of scheduled appointment times/dates

## 2023-03-01 ENCOUNTER — Other Ambulatory Visit (HOSPITAL_COMMUNITY): Payer: Self-pay

## 2023-03-02 ENCOUNTER — Telehealth: Payer: Self-pay

## 2023-03-02 ENCOUNTER — Inpatient Hospital Stay: Payer: Managed Care, Other (non HMO)

## 2023-03-02 ENCOUNTER — Inpatient Hospital Stay (HOSPITAL_BASED_OUTPATIENT_CLINIC_OR_DEPARTMENT_OTHER): Payer: Managed Care, Other (non HMO) | Admitting: Internal Medicine

## 2023-03-02 VITALS — BP 141/91 | HR 75 | Temp 97.8°F | Resp 19 | Ht 67.0 in | Wt 189.2 lb

## 2023-03-02 DIAGNOSIS — C3492 Malignant neoplasm of unspecified part of left bronchus or lung: Secondary | ICD-10-CM | POA: Diagnosis not present

## 2023-03-02 LAB — CMP (CANCER CENTER ONLY)
ALT: 15 U/L (ref 0–44)
AST: 17 U/L (ref 15–41)
Albumin: 4.4 g/dL (ref 3.5–5.0)
Alkaline Phosphatase: 73 U/L (ref 38–126)
Anion gap: 6 (ref 5–15)
BUN: 22 mg/dL (ref 8–23)
CO2: 28 mmol/L (ref 22–32)
Calcium: 9.5 mg/dL (ref 8.9–10.3)
Chloride: 108 mmol/L (ref 98–111)
Creatinine: 1.52 mg/dL — ABNORMAL HIGH (ref 0.44–1.00)
GFR, Estimated: 37 mL/min — ABNORMAL LOW (ref >60)
Glucose, Bld: 139 mg/dL — ABNORMAL HIGH (ref 70–99)
Potassium: 5 mmol/L (ref 3.5–5.1)
Sodium: 142 mmol/L (ref 135–145)
Total Bilirubin: 0.6 mg/dL (ref 0.0–1.2)
Total Protein: 6.9 g/dL (ref 6.5–8.1)

## 2023-03-02 LAB — CBC WITH DIFFERENTIAL (CANCER CENTER ONLY)
Abs Immature Granulocytes: 0.02 10*3/uL (ref 0.00–0.07)
Basophils Absolute: 0.1 10*3/uL (ref 0.0–0.1)
Basophils Relative: 1 %
Eosinophils Absolute: 0.5 10*3/uL (ref 0.0–0.5)
Eosinophils Relative: 6 %
HCT: 40.2 % (ref 36.0–46.0)
Hemoglobin: 13.2 g/dL (ref 12.0–15.0)
Immature Granulocytes: 0 %
Lymphocytes Relative: 20 %
Lymphs Abs: 1.8 10*3/uL (ref 0.7–4.0)
MCH: 30.5 pg (ref 26.0–34.0)
MCHC: 32.8 g/dL (ref 30.0–36.0)
MCV: 92.8 fL (ref 80.0–100.0)
Monocytes Absolute: 0.7 10*3/uL (ref 0.1–1.0)
Monocytes Relative: 7 %
Neutro Abs: 5.8 10*3/uL (ref 1.7–7.7)
Neutrophils Relative %: 66 %
Platelet Count: 226 10*3/uL (ref 150–400)
RBC: 4.33 MIL/uL (ref 3.87–5.11)
RDW: 13.1 % (ref 11.5–15.5)
WBC Count: 8.8 10*3/uL (ref 4.0–10.5)
nRBC: 0 % (ref 0.0–0.2)

## 2023-03-02 NOTE — Telephone Encounter (Signed)
Spoke with patient and encouraged her to try to drink more fluids due to lab results, per Cassie, PA. Patient verbalized understanding.

## 2023-03-02 NOTE — Progress Notes (Signed)
 [facility] [phone]   [phone]  OFFICE PROGRESS NOTE  Diana Suzen HERO, NP [address] [employer] [place] [address]  DIAGNOSIS:  Stage IV (T1a, N0, M1 a) non-small cell lung cancer, adenocarcinoma presented with multifocal disease in both lungs including 3 nodules in the left lung as well as 2 nodules in the right lung diagnosed in December 2019.      Molecular studies showed: KRAS G12C BCOR [id number]-subclonal RAD21 amplification   PDL 1 expression 1%.   PRIOR THERAPY: 1) Systemic chemotherapy with carboplatin  for AUC of 5, Alimta  500 mg/M2 and Keytruda  200 mg IV every 3 weeks.  First dose April 12, 2018. Status post 4 cycles. starting from cycle #3, Keytruda  was dropped and patient received treatment with Carboplatin  for an AUC of 5 and Alimta  500 mg/m2 IV every 3 weeks.  2) Systemic chemotherapy with Alimta  500 mg/m2 IV every 3 weeks. Status post 3 cycles of single agent Alimta . Most recent dose given on 08/23/2018.  The treatment is currently on hold secondary to renal insufficiency   CURRENT THERAPY: Krazati  600 mg BID, first dose on 02/20/22. Dose reduced to 400 mg BID starting on 03/10/22. However, the patient states this was on hold since she was in the hospital on 03/15/22 and she had not resumed it until 05/07/22.   INTERVAL HISTORY: [name] 67 y.o. female returns to the clinic today for follow-up visit.Discussed the use of AI scribe software for clinical note transcription with the patient, who gave verbal consent to proceed.  History of Present Illness   The patient, a 67 year old with a history of stage 4 small cell carcinoma, has been on a treatment regimen for approximately five years. Initially, she was treated with chemotherapy, carboplatin , and Keytruda . However, as the cancer began to progress, the treatment was switched to Adagrasib  due to a positive Kras G12 mutation. The patient has been on this treatment at a dose of  400mg  twice daily.  The patient reports no new symptoms since starting the treatment. However, she has been experiencing severe diarrhea, sometimes up to 10-15 times a day, despite taking Imodium  daily. This has been a long-standing issue, even before the start of the current treatment, but has worsened with the treatment. The patient also reports chronic dehydration, despite efforts to maintain hydration. She has been managing to maintain her weight through snacking.  The patient also reports abdominal bloating and pain, which she has had for a long time. She has a history of reflux and has been followed by a gastroenterologist in the past.  Additionally, the patient has been experiencing falls, with recent incidents resulting in injuries to the knee and elbow. She reports a feeling of disorientation before the falls, with her feet and head not coordinating, leading to her walking in circles or backwards. She has not sought hospital treatment for these falls but did consult a doctor regarding knee pain, which was found to be non-serious upon X-ray examination.       MEDICAL HISTORY: Past Medical History:  Diagnosis Date   Anemia    long time ago (12/06/2012)   Anxiety    Arthritis    right knee real bad; in my hands bad (12/06/2012)   Asthma    Celiac disease    Colon polyps    COPD (chronic obstructive pulmonary disease) (HCC)    Coronary artery disease    a. s/p Xience DES to [facility] 04/2008;  b. LHC  05/2008: Proximal RCA 25%, mid RCA stent patent.  c. Anormal nuc 2014 -> s/p LHC with severe mRCA stenosis s/p DES. d. Cath 04/2013 s/p DES to LAD.  e. LHC 08/2015 was stable.   Depression    years ago (12/06/2012)   Fibromyalgia    Gallstones    GERD (gastroesophageal reflux disease)    H/O hiatal hernia    Hepatitis    not A, B, or C (12/06/2012)   Hyperlipidemia    intol to statins and other chol agents due to elevated LFTs   Hypertension    Hypothyroidism    IBS (irritable bowel  syndrome)    Interstitial cystitis    Lung cancer (HCC) dx'd 01/2018   Lung nodules    Bilateral   Migraines    when I was younger (12/06/2012)   Pneumonia    PONV (postoperative nausea and vomiting)    Very bad   Premature atrial contractions    PVC's (premature ventricular contractions)    Sinus headache    SVT (supraventricular tachycardia)     ALLERGIES:  is allergic to ciprofloxacin, levofloxacin, statins, tricor  [fenofibrate ], compazine  [prochlorperazine ], latex, codeine, and metoprolol .  MEDICATIONS:  Current Outpatient Medications  Medication Sig Dispense Refill   adagrasib  (KRAZATI ) 200 MG tablet Take 2 tablets (400 mg total) by mouth 2 (two) times daily. 180 tablet 3   albuterol  (PROVENTIL ) (5 MG/ML) 0.5% nebulizer solution Take 2.5 mg by nebulization every 6 (six) hours as needed for wheezing.     albuterol  (VENTOLIN  HFA) 108 (90 Base) MCG/ACT inhaler Inhale 1 puff into the lungs every 6 (six) hours as needed (wheezing/shortness of breath.).      aspirin  81 MG chewable tablet Chew 1 tablet (81 mg total) by mouth daily.     Budeson-Glycopyrrol-Formoterol  (BREZTRI  AEROSPHERE) 160-9-4.8 MCG/ACT AERO Inhale 2 puffs into the lungs in the morning and at bedtime. 10.7 g 5   buPROPion  (WELLBUTRIN  XL) 150 MG 24 hr tablet Take 1-2 tablets by mouth 2 (two) times daily. Patient takes 2 tablets (300mg ) in the morning and 1 tablet (150mg ) in the evening     doxycycline  (VIBRA -TABS) 100 MG tablet Take 1 tablet (100 mg total) by mouth 2 (two) times daily. 20 tablet 0   ferrous sulfate  (FEROSUL) 325 (65 FE) MG tablet TAKE 1 TABLET(325 MG) BY MOUTH DAILY WITH BREAKFAST 30 tablet 2   gabapentin  (NEURONTIN ) 100 MG capsule TAKE 1 CAPSULE(100 MG) BY MOUTH THREE TIMES DAILY. 90 capsule 2   ipratropium-albuterol  (DUONEB) 0.5-2.5 (3) MG/3ML SOLN Inhale 3 mLs into the lungs every 6 (six) hours as needed (Asthma). 360 mL 3   loperamide  (IMODIUM  A-D) 2 MG tablet Take 1 tablet (2 mg total) by mouth 4  (four) times daily as needed for diarrhea or loose stools. 20 tablet 0   LORazepam  (ATIVAN ) 2 MG tablet Take 2 mg by mouth every evening.     metoprolol  tartrate (LOPRESSOR ) 25 MG tablet Take 0.5 tablets (12.5 mg total) by mouth 2 (two) times daily. 90 tablet 3   nitroGLYCERIN  (NITROSTAT ) 0.4 MG SL tablet Place 1 tablet (0.4 mg total) under the tongue every 5 (five) minutes as needed for chest pain. 25 tablet 12   ondansetron  (ZOFRAN -ODT) 4 MG disintegrating tablet Take 1 tablet (4 mg total) by mouth every 8 (eight) hours as needed for nausea or vomiting. 30 tablet 2   No current facility-administered medications for this visit.    SURGICAL HISTORY:  Past Surgical History:  Procedure Laterality Date  CARDIAC CATHETERIZATION  04/2008; 05/2008   CARDIAC CATHETERIZATION N/A 08/07/2015   Procedure: Left Heart Cath and Coronary Angiography;  Surgeon: Ozell Fell, MD;  Location: [facility] INVASIVE CV LAB;  Service: Cardiovascular;  Laterality: N/A;   CHOLECYSTECTOMY     CORONARY ANGIOPLASTY WITH STENT PLACEMENT  04/2008 12/06/2012   1 + 1 (12/06/2012)   CORONARY STENT PLACEMENT  05/08/2013   DES TO LAD      DR MCALHANY   IR IMAGING GUIDED PORT INSERTION  04/29/2018   LEFT HEART CATHETERIZATION WITH CORONARY ANGIOGRAM N/A 12/06/2012   Procedure: LEFT HEART CATHETERIZATION WITH CORONARY ANGIOGRAM;  Surgeon: Lonni JONETTA Cash, MD;  Location: [facility] CATH LAB;  Service: Cardiovascular;  Laterality: N/A;   LEFT HEART CATHETERIZATION WITH CORONARY ANGIOGRAM N/A 05/08/2013   Procedure: LEFT HEART CATHETERIZATION WITH CORONARY ANGIOGRAM;  Surgeon: Lonni JONETTA Cash, MD;  Location: [facility] CATH LAB;  Service: Cardiovascular;  Laterality: N/A;   LEFT HEART CATHETERIZATION WITH CORONARY ANGIOGRAM N/A 01/04/2014   Procedure: LEFT HEART CATHETERIZATION WITH CORONARY ANGIOGRAM;  Surgeon: Lonni JONETTA Cash, MD;  Location[facility] CATH LAB;  Service: Cardiovascular;  Laterality: N/A;   PERCUTANEOUS CORONARY STENT  INTERVENTION (PCI-S)  12/06/2012   Procedure: PERCUTANEOUS CORONARY STENT INTERVENTION (PCI-S);  Surgeon: Lonni JONETTA Cash, MD;  Location: [facility] CATH LAB;  Service: Cardiovascular;;   PERCUTANEOUS CORONARY STENT INTERVENTION (PCI-S)  05/08/2013   Procedure: PERCUTANEOUS CORONARY STENT INTERVENTION (PCI-S);  Surgeon: Lonni JONETTA Cash, MD;  Location: [facility] CATH LAB;  Service: Cardiovascular;;  mid LAD    TUBAL LIGATION     VAGINAL HYSTERECTOMY     VIDEO BRONCHOSCOPY WITH ENDOBRONCHIAL NAVIGATION N/A 02/17/2018   Procedure: VIDEO BRONCHOSCOPY WITH ENDOBRONCHIAL NAVIGATION;  Surgeon: Kerrin Elspeth BROCKS, MD;  Location: [facility] OR;  Service: Thoracic;  Laterality: N/A;    REVIEW OF SYSTEMS:  Constitutional: positive for fatigue Eyes: negative Ears, nose, mouth, throat, and face: negative Respiratory: positive for dyspnea on exertion Cardiovascular: negative Gastrointestinal: positive for diarrhea Genitourinary:negative Integument/breast: negative Hematologic/lymphatic: negative Musculoskeletal:positive for muscle weakness Neurological: positive for weakness Behavioral/Psych: negative Endocrine: negative Allergic/Immunologic: negative   PHYSICAL EXAMINATION: General appearance: alert, cooperative, fatigued, and no distress Head: Normocephalic, without obvious abnormality, atraumatic Neck: no adenopathy, no JVD, supple, symmetrical, trachea midline, and thyroid  not enlarged, symmetric, no tenderness/mass/nodules Lymph nodes: Cervical, supraclavicular, and axillary nodes normal. Resp: clear to auscultation bilaterally Back: symmetric, no curvature. ROM normal. No CVA tenderness. Cardio: regular rate and rhythm, S1, S2 normal, no murmur, click, rub or gallop GI: soft, non-tender; bowel sounds normal; no masses,  no organomegaly Extremities: extremities normal, atraumatic, no cyanosis or edema Neurologic: Alert and oriented X 3, normal strength and tone. Normal symmetric reflexes. Normal  coordination and gait  ECOG PERFORMANCE STATUS: 1 - Symptomatic but completely ambulatory  Blood pressure (!) 141/91, pulse 75, temperature 97.8 F (36.6 C), temperature source Temporal, resp. rate 19, height 5' 7 (1.702 m), weight 189 lb 3.2 oz (85.8 kg), SpO2 98%.  LABORATORY DATA: Lab Results  Component Value Date   WBC 8.8 03/02/2023   HGB 13.2 03/02/2023   HCT 40.2 03/02/2023   MCV 92.8 03/02/2023   PLT 226 03/02/2023      Chemistry      Component Value Date/Time   NA 142 03/02/2023 1048   NA 143 01/10/2018 1629   K 5.0 03/02/2023 1048   CL 108 03/02/2023 1048   CO2 28 03/02/2023 1048   BUN 22 03/02/2023 1048   BUN 14 01/10/2018 1629   CREATININE 1.52 (  H) 03/02/2023 1048   CREATININE 0.68 08/05/2015 1456      Component Value Date/Time   CALCIUM  9.5 03/02/2023 1048   ALKPHOS 73 03/02/2023 1048   AST 17 03/02/2023 1048   ALT 15 03/02/2023 1048   BILITOT 0.6 03/02/2023 1048       RADIOGRAPHIC STUDIES: CT CHEST ABDOMEN PELVIS W CONTRAST Result Date: 02/12/2023 CLINICAL DATA:  Metastatic lung cancer restaging * Tracking Code: BO * EXAM: CT CHEST, ABDOMEN, AND PELVIS WITH CONTRAST TECHNIQUE: Multidetector CT imaging of the chest, abdomen and pelvis was performed following the standard protocol during bolus administration of intravenous contrast. RADIATION DOSE REDUCTION: This exam was performed according to the departmental dose-optimization program which includes automated exposure control, adjustment of the mA and/or kV according to patient size and/or use of iterative reconstruction technique. CONTRAST:  OMNIPAQUE  IOHEXOL  300 MG/ML  SOLN COMPARISON:  10/05/2022 FINDINGS: CT CHEST FINDINGS Cardiovascular: Right chest port catheter. Aortic atherosclerosis. Normal heart size. Three-vessel coronary artery calcifications and stents. No pericardial effusion. Mediastinum/Nodes: No enlarged mediastinal, hilar, or axillary lymph nodes. Thyroid  gland, trachea, and esophagus  demonstrate no significant findings. Lungs/Pleura: Moderate centrilobular emphysema and diffuse bilateral bronchial wall thickening. Unchanged spiculated partially subsolid nodule of the peripheral posterior right upper lobe measuring 2.1 x 1.3 cm (series 6, image 42). Multiple additional scattered bilateral ground-glass opacities are unchanged, most notably in the left upper lobe and superior segment left lower lobe (series 6, image 35). No pleural effusion or pneumothorax. Musculoskeletal: No chest wall abnormality. No acute osseous findings. CT ABDOMEN PELVIS FINDINGS Hepatobiliary: No focal liver abnormality is seen. Hepatic steatosis. Status post cholecystectomy. Unchanged postoperative biliary dilatation. Pancreas: Unremarkable. No pancreatic ductal dilatation or surrounding inflammatory changes. Spleen: Normal in size without significant abnormality. Adrenals/Urinary Tract: Adrenal glands are unremarkable. Kidneys are normal, without renal calculi, solid lesion, or hydronephrosis. Bladder is unremarkable. Stomach/Bowel: Stomach is within normal limits. Appendix appears normal. No evidence of bowel wall thickening, distention, or inflammatory changes. Sigmoid diverticulosis. Vascular/Lymphatic: Severe aortic atherosclerosis. No enlarged abdominal or pelvic lymph nodes. Reproductive: Status post hysterectomy. Other: No abdominal wall hernia or abnormality. No ascites. Musculoskeletal: No acute osseous findings. IMPRESSION: 1. Unchanged spiculated partially subsolid nodule of the peripheral posterior right upper lobe measuring 2.1 x 1.3 cm. 2. Multiple additional scattered bilateral ground-glass opacities are unchanged, most notably in the left upper lobe and superior segment left lower lobe. 3. No evidence of lymphadenopathy or metastatic disease in the abdomen or pelvis. 4. Emphysema and diffuse bilateral bronchial wall thickening. 5. Coronary artery disease. 6. Hepatic steatosis. Aortic Atherosclerosis  (ICD10-I70.0) and Emphysema (ICD10-J43.9). Electronically Signed   By: Marolyn JONETTA Jaksch M.D.   On: 02/12/2023 20:05    ASSESSMENT AND PLAN: This is a very pleasant 67 years old white female with a stage IV non-small cell lung cancer, adenocarcinoma that was diagnosed in December 2019 with positive KRAS G12C mutation and PD-L1 expression of 1%. The patient started systemic chemotherapy initially with carboplatin , Alimta  and Keytruda  for 4 cycles but Keytruda  was discontinued starting from cycle #3 because of toxicity issues. The patient was then on observation and she had evidence for disease progression and resumed her treatment with single agent Alimta  for 3 cycles but this was on hold since August 23, 2018 secondary to renal insufficiency. She had evidence for disease progression few months ago and the patient started treatment with Krazati  (Adagrasib ) 600 mg p.o. daily on February 20, 2022 but her dose was dropped to 400 mg p.o. twice daily  on March 10, 2022. She was in the hospital and her treatment has been on hold from March 15, 2022 until May 07, 2022 The patient has been tolerating her treatment with Krazati  (Adagrasib ) at a reduced dose of 400 mg p.o. twice daily since March 2024 except for intermittent episodes of diarrhea. The patient had repeat CT scan of the chest, abdomen and pelvis performed recently.  I personally and independently reviewed the scan and discussed the result with the patient today.  Her scan showed no concerning findings for disease progression. Assessment and Plan    Metastatic Non-Small Cell Lung Cancer (NSCLC) Stage IV NSCLC with KRAS G12C mutation. On adagrasib  400 mg BID since January 2024. Recent scan shows no progression. Tolerating treatment well but experiencing significant diarrhea. Discussed risks of continued diarrhea, potential need for dose reduction or treatment breaks, and importance of maintaining current regimen due to its effectiveness. - Continue adagrasib   400 mg BID - Monitor for worsening diarrhea - Consider dose reduction or treatment break if diarrhea worsens - Encourage hydration and dietary adjustments - Follow-up in two months with lab work  Chronic Diarrhea Severe diarrhea, 10-15 times/day, exacerbated by cancer treatment. Limited relief with loperamide . Discussed potential gastroenterology referral. - Continue loperamide  daily - Consider gastroenterology referral - Monitor weight and hydration status - Encourage fluid intake  Dehydration Symptoms of dehydration despite fluid intake. Discussed importance of hydration and potential need for IV fluids. - Encourage increased fluid intake - Monitor kidney function and electrolytes - Consider IV fluids if oral intake is insufficient  Gastroesophageal Reflux Disease (GERD) Worsening reflux symptoms. Not under active management. Discussed need for gastroenterology referral. - Refer to gastroenterologist - Consider starting or adjusting GERD medication  Frequent Falls Multiple falls resulting in knee and elbow injuries. Previous knee X-ray normal. Discussed fall prevention and potential physical therapy referral. - Advise on fall prevention strategies - Consider physical therapy referral - Monitor for new or worsening injuries  Follow-up - Schedule follow-up in two months - Order lab work prior to next visit.   The patient was advised to call immediately if she has any other concerning symptoms in the interval. The patient voices understanding of current disease status and treatment options and is in agreement with the current care plan.  All questions were answered. The patient knows to call the clinic with any problems, questions or concerns. We can certainly see the patient much sooner if necessary.  The total time spent in the appointment was 30 minutes.  Disclaimer: This note was dictated with voice recognition software. Similar sounding words can inadvertently be  transcribed and may not be corrected upon review.

## 2023-03-04 ENCOUNTER — Other Ambulatory Visit (HOSPITAL_COMMUNITY): Payer: Self-pay

## 2023-03-04 ENCOUNTER — Other Ambulatory Visit (HOSPITAL_COMMUNITY): Payer: Self-pay | Admitting: Pharmacy Technician

## 2023-03-04 NOTE — Progress Notes (Signed)
 Specialty Pharmacy Refill Coordination Note  [name] is a 68 y.o. female contacted today regarding refills of specialty medication(s) Adagrasib  (Krazati )   Patient requested Delivery   Delivery date: 03/12/23   Verified address: [address] [place]   Medication will be filled on 03/11/23.

## 2023-03-05 ENCOUNTER — Telehealth: Payer: Self-pay | Admitting: Physician Assistant

## 2023-03-05 NOTE — Telephone Encounter (Signed)
 Left patient a voicemail regarding scheduled appointment times/dates

## 2023-03-11 ENCOUNTER — Other Ambulatory Visit: Payer: Self-pay

## 2023-03-30 ENCOUNTER — Other Ambulatory Visit: Payer: Self-pay

## 2023-03-31 ENCOUNTER — Other Ambulatory Visit: Payer: Self-pay

## 2023-03-31 NOTE — Progress Notes (Signed)
Specialty Pharmacy Ongoing Clinical Assessment Note  [name] is a 68 y.o. female who is being followed by the specialty pharmacy service for RxSp Oncology   Patient's specialty medication(s) reviewed today: Adagrasib [name])   Missed doses in the last 4 weeks: 0   Patient/Caregiver did not have any additional questions or concerns.   Therapeutic benefit summary: Patient is achieving benefit   Adverse events/side effects summary: No adverse events/side effects   Patient's therapy is appropriate to: Continue    Goals Addressed             This Visit's Progress    Slow Disease Progression       Patient is on track. Patient will maintain adherence         Follow up:  6 months  Otto Herb Specialty Pharmacist

## 2023-03-31 NOTE — Progress Notes (Signed)
Specialty Pharmacy Refill Coordination Note  [name] is a 68 y.o. female contacted today regarding refills of specialty medication(s) Adagrasib [name])   Patient requested Delivery   Delivery date: 04/07/23   Verified address: [address] [place]    Medication will be filled on 04/06/23.

## 2023-04-23 NOTE — Progress Notes (Signed)
 [facility] OFFICE PROGRESS NOTE  Diana Confer, NP [address] [place] [address]  DIAGNOSIS: Stage IV (T1a, N0, M1 a) non-small cell lung cancer, adenocarcinoma presented with multifocal disease in both lungs including 3 nodules in the left lung as well as 2 nodules in the right lung diagnosed in December 2019.     Molecular studies showed: KRAS G12C BCOR N1425S-subclonal RAD21 amplification   PDL 1 expression 1%.  PRIOR THERAPY: 1) Systemic chemotherapy with carboplatin for AUC of 5, Alimta 500 mg/M2 and Keytruda 200 mg IV every 3 weeks.  First dose April 12, 2018. Status post 4 cycles. starting from cycle #3, Keytruda was dropped and patient received treatment with Carboplatin for an AUC of 5 and Alimta 500 mg/m2 IV every 3 weeks.  2) Systemic chemotherapy with Alimta 500 mg/m2 IV every 3 weeks. Status post 3 cycles of single agent Alimta. Most recent dose given on 08/23/2018.  The treatment is currently on hold secondary to renal insufficiency  CURRENT THERAPY: Krazati 600 mg BID, first dose on 02/20/22. Dose reduced to 400 mg BID starting on 03/10/22. However, the patient states this was on hold since she was in the hospital on 03/15/22 and she had not resumed it until 05/07/22.   INTERVAL HISTORY: [name] 68 y.o. female returns to the clinic today for a follow-up visit.  The patient was last seen in the clinic by [name] on 03/02/2023. She has some transportation constraints.  She does not drive.    She is currently on dose reduced targeted treatment with [place].  She is tolerating this fairly well without any appreciable side effects.  She in general has a lot of unusual symptoms that have been ongoing even since prior to starting [place] and she has a complex medical history.  For example, she has been having nausea and vomiting and intermittent diarrhea even when off treatment and on observation for several years.  The diarrhea started after having  her gallbladder removed. She has not seen any appreciable change in the frequency and severity since being on [place]. She has IBS. She also has fibromyalgia and has chronic multifocal joint pain. She also has COPD and is on supplemental oxygen at baseline. She also has CAD.   Today, she tells me she believes her PCP's practice is going out of business. She needs a flu shot. She also has been having some increased urinary frequency but does not void a large volume. She had similar symptoms when she had a UTI in the past. She also has osteoarthritis. She mentions she sometimes gets left upper chest discomfort and soreness into her shoulder that is tender to touch/move. This is non-exertional and lasts 30 seconds. She is not having pain at this time.   She denies fevers but gets chills at baseline on occasion. Her weight is stable.  Regarding her diarrhea, she states as long as she takes her Imodium every day it helps keep her diarrhea at bay.  She has dyspnea exertion at baseline for which she wears supplemental oxygen.  She also has an intermittent cough that comes and goes and she takes Robitussin.  Denies any hemoptysis.  She had a headache for 4 days but resolved yesterday.  Otherwise she denies any frequent headaches.  She denies any rashes or skin changes.  She is here today for evaluation and repeat blood work.   MEDICAL HISTORY: Past Medical History:  Diagnosis Date   Anemia    "  long time ago" (12/06/2012)   Anxiety    Arthritis    "right knee real bad; in my hands bad" (12/06/2012)   Asthma    Celiac disease    Colon polyps    COPD (chronic obstructive pulmonary disease) (HCC)    Coronary artery disease    a. s/p Xience DES to [facility] 04/2008;  b. LHC 05/2008: Proximal RCA 25%, mid RCA stent patent.  c. Anormal nuc 2014 -> s/p LHC with severe mRCA stenosis s/p DES. d. Cath 04/2013 s/p DES to LAD.  e. LHC 08/2015 was stable.   Depression    "years ago" (12/06/2012)   Fibromyalgia    Gallstones     GERD (gastroesophageal reflux disease)    H/O hiatal hernia    Hepatitis    "not A, B, or C" (12/06/2012)   Hyperlipidemia    intol to statins and other chol agents due to elevated LFTs   Hypertension    Hypothyroidism    IBS (irritable bowel syndrome)    Interstitial cystitis    Lung cancer (HCC) dx'd 01/2018   Lung nodules    Bilateral   Migraines    "when I was younger" (12/06/2012)   Pneumonia    PONV (postoperative nausea and vomiting)    "Very bad"   Premature atrial contractions    PVC's (premature ventricular contractions)    Sinus headache    SVT (supraventricular tachycardia)     ALLERGIES:  is allergic to ciprofloxacin, levofloxacin, statins, tricor [fenofibrate], compazine [prochlorperazine], latex, codeine, and metoprolol.  MEDICATIONS:  Current Outpatient Medications  Medication Sig Dispense Refill   adagrasib (KRAZATI) 200 MG tablet Take 2 tablets (400 mg total) by mouth 2 (two) times daily. 180 tablet 3   albuterol (PROVENTIL) (5 MG/ML) 0.5% nebulizer solution Take 2.5 mg by nebulization every 6 (six) hours as needed for wheezing.     albuterol (VENTOLIN HFA) 108 (90 Base) MCG/ACT inhaler Inhale 1 puff into the lungs every 6 (six) hours as needed (wheezing/shortness of breath.).      aspirin 81 MG chewable tablet Chew 1 tablet (81 mg total) by mouth daily.     Budeson-Glycopyrrol-Formoterol (BREZTRI AEROSPHERE) 160-9-4.8 MCG/ACT AERO Inhale 2 puffs into the lungs in the morning and at bedtime. 10.7 g 5   buPROPion (WELLBUTRIN XL) 150 MG 24 hr tablet Take 1-2 tablets by mouth 2 (two) times daily. Patient takes 2 tablets (300mg ) in the morning and 1 tablet (150mg ) in the evening     doxycycline (VIBRA-TABS) 100 MG tablet Take 1 tablet (100 mg total) by mouth 2 (two) times daily. 20 tablet 0   ferrous sulfate (FEROSUL) 325 (65 FE) MG tablet TAKE 1 TABLET(325 MG) BY MOUTH DAILY WITH BREAKFAST 30 tablet 2   gabapentin (NEURONTIN) 100 MG capsule TAKE 1 CAPSULE(100 MG)  BY MOUTH THREE TIMES DAILY. 90 capsule 2   ipratropium-albuterol (DUONEB) 0.5-2.5 (3) MG/3ML SOLN Inhale 3 mLs into the lungs every 6 (six) hours as needed (Asthma). 360 mL 3   loperamide (IMODIUM A-D) 2 MG tablet Take 1 tablet (2 mg total) by mouth 4 (four) times daily as needed for diarrhea or loose stools. 20 tablet 0   LORazepam (ATIVAN) 2 MG tablet Take 2 mg by mouth every evening.     metoprolol tartrate (LOPRESSOR) 25 MG tablet Take 0.5 tablets (12.5 mg total) by mouth 2 (two) times daily. 90 tablet 3   nitroGLYCERIN (NITROSTAT) 0.4 MG SL tablet Place 1 tablet (0.4 mg total) under the tongue  every 5 (five) minutes as needed for chest pain. 25 tablet 12   ondansetron (ZOFRAN-ODT) 4 MG disintegrating tablet Take 1 tablet (4 mg total) by mouth every 8 (eight) hours as needed for nausea or vomiting. 30 tablet 2   No current facility-administered medications for this visit.    SURGICAL HISTORY:  Past Surgical History:  Procedure Laterality Date   CARDIAC CATHETERIZATION  04/2008; 05/2008   CARDIAC CATHETERIZATION N/A 08/07/2015   Procedure: Left Heart Cath and Coronary Angiography;  Surgeon: Tonny Bollman, MD;  Location: [facility] INVASIVE CV LAB;  Service: Cardiovascular;  Laterality: N/A;   CHOLECYSTECTOMY     CORONARY ANGIOPLASTY WITH STENT PLACEMENT  04/2008 12/06/2012   "1 + 1" (12/06/2012)   CORONARY STENT PLACEMENT  05/08/2013   DES TO LAD      DR MCALHANY   IR IMAGING GUIDED PORT INSERTION  04/29/2018   LEFT HEART CATHETERIZATION WITH CORONARY ANGIOGRAM N/A 12/06/2012   Procedure: LEFT HEART CATHETERIZATION WITH CORONARY ANGIOGRAM;  Surgeon: Kathleene Hazel, MD;  Location: [facility] CATH LAB;  Service: Cardiovascular;  Laterality: N/A;   LEFT HEART CATHETERIZATION WITH CORONARY ANGIOGRAM N/A 05/08/2013   Procedure: LEFT HEART CATHETERIZATION WITH CORONARY ANGIOGRAM;  Surgeon: Kathleene Hazel, MD;  Location: [facility] CATH LAB;  Service: Cardiovascular;  Laterality: N/A;   LEFT HEART  CATHETERIZATION WITH CORONARY ANGIOGRAM N/A 01/04/2014   Procedure: LEFT HEART CATHETERIZATION WITH CORONARY ANGIOGRAM;  Surgeon: Kathleene Hazel, MD;  Location: [facility] CATH LAB;  Service: Cardiovascular;  Laterality: N/A;   PERCUTANEOUS CORONARY STENT INTERVENTION (PCI-S)  12/06/2012   Procedure: PERCUTANEOUS CORONARY STENT INTERVENTION (PCI-S);  Surgeon: Kathleene Hazel, MD;  Location: [facility] CATH LAB;  Service: Cardiovascular;;   PERCUTANEOUS CORONARY STENT INTERVENTION (PCI-S)  05/08/2013   Procedure: PERCUTANEOUS CORONARY STENT INTERVENTION (PCI-S);  Surgeon: Kathleene Hazel, MD;  Location: [facility] CATH LAB;  Service: Cardiovascular;;  mid LAD    TUBAL LIGATION     VAGINAL HYSTERECTOMY     VIDEO BRONCHOSCOPY WITH ENDOBRONCHIAL NAVIGATION N/A 02/17/2018   Procedure: VIDEO BRONCHOSCOPY WITH ENDOBRONCHIAL NAVIGATION;  Surgeon: Loreli Slot, MD;  Location: [facility] OR;  Service: Thoracic;  Laterality: N/A;    REVIEW OF SYSTEMS:   Review of Systems  Constitutional: Positive for stable fatigue. Negative for appetite change, chills, fever and unexpected weight change.  HENT: Negative for mouth sores, nosebleeds, sore throat and trouble swallowing.   Eyes: Negative for eye problems and icterus.  Respiratory: Positive for stable dyspnea on exertion/cough. Negative for hemoptysis, and wheezing.   Cardiovascular: Intermittent left shoulder/rib pain. Negative for leg swelling.  Gastrointestinal: Positive for chronic diarrhea and intermittent nausea/vomiting. Negative for abdominal pain and  constipation.  Genitourinary: Positive for dysuria. Negative for bladder incontinence and hematuria.   Musculoskeletal: Positive for multifocal joint pain (osteoarthritis and fibromyalgia). Negative for gait problem, neck pain and neck stiffness.  Skin: Negative for itching and rash.  Neurological: Positive for headache for 4 days (none at this time)Negative for dizziness, extremity weakness, gait problem,  light-headedness and seizures.  Hematological: Negative for adenopathy. Does not bruise/bleed easily.  Psychiatric/Behavioral: Negative for confusion, depression and sleep disturbance. The patient is not nervous/anxious.     PHYSICAL EXAMINATION:  There were no vitals taken for this visit.  ECOG PERFORMANCE STATUS: 2-3  Physical Exam  Constitutional: Oriented to person, place, and time and chronically ill-appearing female and in no distress.  HENT:  Head: Normocephalic and atraumatic.  Mouth/Throat: Oropharynx is clear and moist. No oropharyngeal exudate.  Eyes:  Conjunctivae are normal. Right eye exhibits no discharge. Left eye exhibits no discharge. No scleral icterus.  Neck: Normal range of motion. Neck supple Cardiovascular: Normal rate, regular rhythm, normal heart sounds and intact distal pulses.   Pulmonary/Chest: Effort normal.  Quiet breath sounds bilaterally.  On supplemental oxygen.  No respiratory distress. No wheezes. No rales.  Abdominal: Soft. Bowel sounds are normal. Exhibits no distension and no mass. There is no tenderness.  Musculoskeletal: Normal range of motion. Exhibits no edema.  Lymphadenopathy:    No cervical adenopathy.  Neurological: Alert and oriented to person, place, and time. Exhibits muscle wasting.  Skin: Skin is warm and dry. No rash noted. Not diaphoretic. No erythema. No pallor.  Psychiatric: Mood, memory and judgment normal.  Vitals reviewed.  LABORATORY DATA: Lab Results  Component Value Date   WBC 8.8 03/02/2023   HGB 13.2 03/02/2023   HCT 40.2 03/02/2023   MCV 92.8 03/02/2023   PLT 226 03/02/2023      Chemistry      Component Value Date/Time   NA 142 03/02/2023 1048   NA 143 01/10/2018 1629   K 5.0 03/02/2023 1048   CL 108 03/02/2023 1048   CO2 28 03/02/2023 1048   BUN 22 03/02/2023 1048   BUN 14 01/10/2018 1629   CREATININE 1.52 (H) 03/02/2023 1048   CREATININE 0.68 08/05/2015 1456      Component Value Date/Time   CALCIUM 9.5  03/02/2023 1048   ALKPHOS 73 03/02/2023 1048   AST 17 03/02/2023 1048   ALT 15 03/02/2023 1048   BILITOT 0.6 03/02/2023 1048       RADIOGRAPHIC STUDIES:  No results found.   ASSESSMENT/PLAN:  This is a very pleasant 68 year old Caucasian female diagnosed with stage IV non-small cell lung cancer, adenocarcinoma. She is negative for any actionable mutations and her PD-L1 expression is 1%. She was diagnosed in December 2019. She presented with bilateral pulmonary nodules.    She initially started treatment with carboplatin, Alimta, and Keytruda. She was status post 4 cycles but Keytruda was discontinued after cycle #2 due to his significant skin rash. She then was treated with maintenance single agent Alimta for 3 cycles. This has been on hold since June 2020 due to worsening renal insufficiency.   She has been on observation since that time and feeling fairly well except she does have significant COPD for which she is on supplemental oxygen. She follows closely with pulmonology.    In November 2023 the patient had a repeat CT scan which showed new area of nodularity with potential endobronchial extension and lymph node adjacent to the endobronchial material in the right hilu that is suspicious and a new solid nodule in the medial right lower lobe concerning for additional site of disease.    She had a PET scan which showed evidence of disease progression with multiple bilateral solid and subsolid nodules.   Due to the patient's history of intolerance to chemotherapy she was started on second line targeted treatment with Krazati 600 mg twice daily.  Her first dose was on 02/20/22.  Dose was reduced to 400 mg twice daily around 03/03/2022 due to ongoing symptoms of nausea, vomiting, and diarrhea. She resumed this on 05/07/22.   She was lost to follow-up since August 2024 due to being in [facility] for the birth of her great grandchild.  Labs were reviewed.  Recommend that she  continue on the same treatment at the same dose.  I will arrange for restaging  CT scan of the chest, abdomen, pelvis prior to her next cycle of treatment.  We will see her back for follow-up visit in approximately 4 weeks from now, 1 week after her next scan.  She will continue with imodium for diarrhea. She will continue with her anti-emetic if needed.   We will administer the flu shot today per patient request.   She will arrange for UA and culture due to difficulty urinating.   I referred her to a PCP since the current practice she is going to may be closing per patient report. I discussed the importance of having a PCP. She inquired about being referred to pain management for her fibromyalgia and osteoarthritis but I asked that she discuss this with her PCP.   She is not having any left rib/shoulder pain at this time but she reports soreness and stiffness when this occurs. This is likely musculoskeletal in nature. However, we did review angina symptoms that would warrant ER evaluation. I encouraged her to follow up with her cardiologist if this continues to occur just to ensure she does not need to be medical managed differently. She states she was told in the past she would not be a candidate for any invasive procedures if ever needed.    The patient was advised to call immediately if she has any concerning symptoms in the interval. The patient voices understanding of current disease status and treatment options and is in agreement with the current care plan. All questions were answered. The patient knows to call the clinic with any problems, questions or concerns. We can certainly see the patient much sooner if necessary    No orders of the defined types were placed in this encounter.    The total time spent in the appointment was 30-39 minutes  Charleton Deyoung L Hargis Vandyne, PA-C 04/23/23

## 2023-04-27 ENCOUNTER — Other Ambulatory Visit: Payer: Self-pay

## 2023-04-28 ENCOUNTER — Inpatient Hospital Stay: Payer: Managed Care, Other (non HMO) | Admitting: Physician Assistant

## 2023-04-28 ENCOUNTER — Inpatient Hospital Stay: Payer: Managed Care, Other (non HMO)

## 2023-04-28 ENCOUNTER — Telehealth: Payer: Self-pay

## 2023-04-28 ENCOUNTER — Inpatient Hospital Stay: Payer: Managed Care, Other (non HMO) | Attending: Physician Assistant

## 2023-04-28 VITALS — BP 148/93 | HR 109 | Temp 98.8°F | Resp 17 | Ht 67.0 in | Wt 187.1 lb

## 2023-04-28 DIAGNOSIS — C3492 Malignant neoplasm of unspecified part of left bronchus or lung: Secondary | ICD-10-CM | POA: Insufficient documentation

## 2023-04-28 DIAGNOSIS — J449 Chronic obstructive pulmonary disease, unspecified: Secondary | ICD-10-CM | POA: Diagnosis not present

## 2023-04-28 DIAGNOSIS — C3491 Malignant neoplasm of unspecified part of right bronchus or lung: Secondary | ICD-10-CM | POA: Insufficient documentation

## 2023-04-28 DIAGNOSIS — Z23 Encounter for immunization: Secondary | ICD-10-CM | POA: Diagnosis not present

## 2023-04-28 DIAGNOSIS — M1990 Unspecified osteoarthritis, unspecified site: Secondary | ICD-10-CM | POA: Insufficient documentation

## 2023-04-28 DIAGNOSIS — N289 Disorder of kidney and ureter, unspecified: Secondary | ICD-10-CM | POA: Insufficient documentation

## 2023-04-28 DIAGNOSIS — R350 Frequency of micturition: Secondary | ICD-10-CM | POA: Insufficient documentation

## 2023-04-28 DIAGNOSIS — R300 Dysuria: Secondary | ICD-10-CM | POA: Diagnosis not present

## 2023-04-28 DIAGNOSIS — Z9981 Dependence on supplemental oxygen: Secondary | ICD-10-CM | POA: Diagnosis not present

## 2023-04-28 DIAGNOSIS — M797 Fibromyalgia: Secondary | ICD-10-CM | POA: Insufficient documentation

## 2023-04-28 DIAGNOSIS — Z95828 Presence of other vascular implants and grafts: Secondary | ICD-10-CM

## 2023-04-28 DIAGNOSIS — Z9221 Personal history of antineoplastic chemotherapy: Secondary | ICD-10-CM | POA: Insufficient documentation

## 2023-04-28 LAB — URINALYSIS, COMPLETE (UACMP) WITH MICROSCOPIC
Bilirubin Urine: NEGATIVE
Glucose, UA: NEGATIVE mg/dL
Hgb urine dipstick: NEGATIVE
Ketones, ur: NEGATIVE mg/dL
Leukocytes,Ua: NEGATIVE
Nitrite: NEGATIVE
Protein, ur: 30 mg/dL — AB
Specific Gravity, Urine: 1.026 (ref 1.005–1.030)
pH: 5 (ref 5.0–8.0)

## 2023-04-28 LAB — CBC WITH DIFFERENTIAL (CANCER CENTER ONLY)
Abs Immature Granulocytes: 0.03 10*3/uL (ref 0.00–0.07)
Basophils Absolute: 0.1 10*3/uL (ref 0.0–0.1)
Basophils Relative: 1 %
Eosinophils Absolute: 0.4 10*3/uL (ref 0.0–0.5)
Eosinophils Relative: 5 %
HCT: 38.9 % (ref 36.0–46.0)
Hemoglobin: 12.6 g/dL (ref 12.0–15.0)
Immature Granulocytes: 0 %
Lymphocytes Relative: 19 %
Lymphs Abs: 1.4 10*3/uL (ref 0.7–4.0)
MCH: 30.8 pg (ref 26.0–34.0)
MCHC: 32.4 g/dL (ref 30.0–36.0)
MCV: 95.1 fL (ref 80.0–100.0)
Monocytes Absolute: 0.6 10*3/uL (ref 0.1–1.0)
Monocytes Relative: 9 %
Neutro Abs: 4.7 10*3/uL (ref 1.7–7.7)
Neutrophils Relative %: 66 %
Platelet Count: 179 10*3/uL (ref 150–400)
RBC: 4.09 MIL/uL (ref 3.87–5.11)
RDW: 12.9 % (ref 11.5–15.5)
WBC Count: 7.1 10*3/uL (ref 4.0–10.5)
nRBC: 0 % (ref 0.0–0.2)

## 2023-04-28 LAB — CMP (CANCER CENTER ONLY)
ALT: 26 U/L (ref 0–44)
AST: 30 U/L (ref 15–41)
Albumin: 4.2 g/dL (ref 3.5–5.0)
Alkaline Phosphatase: 83 U/L (ref 38–126)
Anion gap: 5 (ref 5–15)
BUN: 29 mg/dL — ABNORMAL HIGH (ref 8–23)
CO2: 31 mmol/L (ref 22–32)
Calcium: 9.3 mg/dL (ref 8.9–10.3)
Chloride: 105 mmol/L (ref 98–111)
Creatinine: 1.29 mg/dL — ABNORMAL HIGH (ref 0.44–1.00)
GFR, Estimated: 45 mL/min — ABNORMAL LOW
Glucose, Bld: 160 mg/dL — ABNORMAL HIGH (ref 70–99)
Potassium: 4.3 mmol/L (ref 3.5–5.1)
Sodium: 141 mmol/L (ref 135–145)
Total Bilirubin: 0.6 mg/dL (ref 0.0–1.2)
Total Protein: 6.5 g/dL (ref 6.5–8.1)

## 2023-04-28 MED ORDER — SODIUM CHLORIDE 0.9% FLUSH
10.0000 mL | Freq: Once | INTRAVENOUS | Status: AC
Start: 2023-04-28 — End: 2023-04-28
  Administered 2023-04-28: 10 mL

## 2023-04-28 MED ORDER — HEPARIN SOD (PORK) LOCK FLUSH 100 UNIT/ML IV SOLN
500.0000 [IU] | Freq: Once | INTRAVENOUS | Status: AC
Start: 2023-04-28 — End: 2023-04-28
  Administered 2023-04-28: 500 [IU]

## 2023-04-28 MED ORDER — INFLUENZA VAC A&B SURF ANT ADJ 0.5 ML IM SUSY
0.5000 mL | PREFILLED_SYRINGE | Freq: Once | INTRAMUSCULAR | Status: AC
  Administered 2023-04-28: 0.5 mL via INTRAMUSCULAR
  Filled 2023-04-28: qty 0.5

## 2023-04-28 NOTE — Telephone Encounter (Signed)
 Tried to reach patient in regards to urinalysis. Per Cassie, PA- Urine does not look suspicious for UTI. The culture is pending. No need for antibiotics. Will call patient with culture results when resulted.

## 2023-04-29 ENCOUNTER — Telehealth: Payer: Self-pay | Admitting: Physician Assistant

## 2023-04-29 LAB — URINE CULTURE: Culture: NO GROWTH

## 2023-04-29 NOTE — Telephone Encounter (Signed)
 Rescheduled appointments per scheduling the wrong dates. Left the patient a voicemail will the updated appointment details and to call back if the new appointments do not work for her.

## 2023-04-30 ENCOUNTER — Encounter: Payer: Self-pay | Admitting: Internal Medicine

## 2023-04-30 ENCOUNTER — Other Ambulatory Visit: Payer: Self-pay

## 2023-04-30 ENCOUNTER — Telehealth: Payer: Self-pay

## 2023-04-30 ENCOUNTER — Other Ambulatory Visit (HOSPITAL_COMMUNITY): Payer: Self-pay

## 2023-04-30 NOTE — Progress Notes (Signed)
 Specialty Pharmacy Refill Coordination Note  [name] is a 68 y.o. female contacted today regarding refills of specialty medication(s) No data recorded  Patient requested (Patient-Rptd) Delivery   Delivery date: (Patient-Rptd) 10-12-2054   Verified address: (Patient-Rptd) [address] [place] [address]   Medication will be filled on 05/03/23.   New delivery date is 05/04/23. Patient is aware of delay.

## 2023-04-30 NOTE — Telephone Encounter (Signed)
 Spoke with patient in regards to urinalysis results performed on 2/26. Per Cassie, PA- urinalysis does not look suspicious for UTI.  Patient stated that "It burns when I pee, peeing frequently, feels like my bladder is about to fall out, my lower abdomen is achy, and it feels like the last time I had a UTI."  Informed patient I will relay message to Cassie, PA and call her back with recommendations.

## 2023-05-03 ENCOUNTER — Other Ambulatory Visit: Payer: Self-pay

## 2023-05-04 ENCOUNTER — Encounter: Payer: Self-pay | Admitting: Internal Medicine

## 2023-05-04 NOTE — Telephone Encounter (Signed)
 Late entry (04/30/2023)- Informed patient to follow up with PCP.  Patient stated that she has a urologist and will call their office.

## 2023-05-19 ENCOUNTER — Encounter (HOSPITAL_COMMUNITY): Payer: Self-pay

## 2023-05-19 ENCOUNTER — Ambulatory Visit (HOSPITAL_COMMUNITY)
Admission: RE | Admit: 2023-05-19 | Discharge: 2023-05-19 | Disposition: A | Discharge status: Home / Self Care | Payer: Managed Care, Other (non HMO) | Source: Ambulatory Visit | Attending: Physician Assistant | Admitting: Physician Assistant

## 2023-05-19 DIAGNOSIS — C3492 Malignant neoplasm of unspecified part of left bronchus or lung: Secondary | ICD-10-CM | POA: Diagnosis present

## 2023-05-19 MED ORDER — HEPARIN SOD (PORK) LOCK FLUSH 100 UNIT/ML IV SOLN
500.0000 [IU] | Freq: Once | INTRAVENOUS | Status: AC
Start: 2023-05-19 — End: 2023-05-19
  Administered 2023-05-19: 500 [IU] via INTRAVENOUS

## 2023-05-19 MED ORDER — IOHEXOL 300 MG/ML  SOLN
75.0000 mL | Freq: Once | INTRAMUSCULAR | Status: AC | PRN
  Administered 2023-05-19: 75 mL via INTRAVENOUS

## 2023-05-19 MED ORDER — HEPARIN SOD (PORK) LOCK FLUSH 100 UNIT/ML IV SOLN
INTRAVENOUS | Status: AC
  Filled 2023-05-19: qty 5

## 2023-05-21 ENCOUNTER — Other Ambulatory Visit: Payer: Self-pay | Admitting: Physician Assistant

## 2023-05-21 DIAGNOSIS — C3492 Malignant neoplasm of unspecified part of left bronchus or lung: Secondary | ICD-10-CM

## 2023-05-22 NOTE — Progress Notes (Deleted)
 [facility] OFFICE PROGRESS NOTE  Diana Glimpse, NP [address] [place] [address]  DIAGNOSIS: Stage IV (T1a, N0, M1 a) non-small cell lung cancer, adenocarcinoma presented with multifocal disease in both lungs including 3 nodules in the left lung as well as 2 nodules in the right lung diagnosed in December 2019.     Molecular studies showed: KRAS G12C BCOR [id number]-subclonal RAD21 amplification   PDL 1 expression 1%  PRIOR THERAPY:  1) Systemic chemotherapy with carboplatin  for AUC of 5, Alimta  500 mg/M2 and Keytruda  200 mg IV every 3 weeks.  First dose April 12, 2018. Status post 4 cycles. starting from cycle #3, Keytruda  was dropped and patient received treatment with Carboplatin  for an AUC of 5 and Alimta  500 mg/m2 IV every 3 weeks.  2) Systemic chemotherapy with Alimta  500 mg/m2 IV every 3 weeks. Status post 3 cycles of single agent Alimta . Most recent dose given on 08/23/2018.  The treatment is currently on hold secondary to renal insufficiency  CURRENT THERAPY: Krazati  600 mg BID, first dose on 02/20/22. Dose reduced to 400 mg BID starting on 03/10/22. However, the patient states this was on hold since she was in the hospital on 03/15/22 and she had not resumed it until 05/07/22.   INTERVAL HISTORY: [name] 68 y.o. female returns to clinic today for follow-up visit.  The patient was last seen in clinic by myself on 04/28/2023.  The patient is currently on a reduced dose of treatment with Krazati .  She tolerates this well without any adverse side effects.  In general the patient does have a lot of unusual symptoms and side effects that have been going on predating Krazati .  For example she has been having nausea and vomiting and intermittent diarrhea ongoing even while off treatment on observation for several years.  The diarrhea started after having her gallbladder removed.  She has not noticed any changes in the frequency or severity of her diarrhea since  being on Krazati .  She has IBS.  She also has fibromyalgia and has chronic multifocal joint pain.  She has COPD and is on supplemental oxygen at baseline.  She also has coronary artery disease.  Last appointment urinary tract infection.  She denies any fevers.  Chills?  Weight is stable?  She takes Imodium  daily to help keep her diarrhea at bay.  She reports dyspnea exertion at baseline for which she wears supplemental oxygen.  She has an intermittent cough that comes and goes and takes Robitussin.  Denies any hemoptysis.  She has headaches frequently but?  Otherwise she denies any?  She denies any rashes or skin changes.  She recently had a restaging CT scan.  She is here today for evaluation and to review her scan results. MEDICAL HISTORY: Past Medical History:  Diagnosis Date   Anemia    "long time ago" (12/06/2012)   Anxiety    Arthritis    "right knee real bad; in my hands bad" (12/06/2012)   Asthma    Celiac disease    Colon polyps    COPD (chronic obstructive pulmonary disease) (HCC)    Coronary artery disease    a. s/p Xience DES to [facility] 04/2008;  b. LHC 05/2008: Proximal RCA 25%, mid RCA stent patent.  c. Anormal nuc 2014 -> s/p LHC with severe mRCA stenosis s/p DES. d. Cath 04/2013 s/p DES to LAD.  e. LHC 08/2015 was stable.   Depression    "years ago" (12/06/2012)  Fibromyalgia    Gallstones    GERD (gastroesophageal reflux disease)    H/O hiatal hernia    Hepatitis    "not A, B, or C" (12/06/2012)   Hyperlipidemia    intol to statins and other chol agents due to elevated LFTs   Hypertension    Hypothyroidism    IBS (irritable bowel syndrome)    Interstitial cystitis    Lung cancer (HCC) dx'd 01/2018   Lung nodules    Bilateral   Migraines    "when I was younger" (12/06/2012)   Pneumonia    PONV (postoperative nausea and vomiting)    "Very bad"   Premature atrial contractions    PVC's (premature ventricular contractions)    Sinus headache    SVT (supraventricular  tachycardia) (HCC)     ALLERGIES:  is allergic to ciprofloxacin, levofloxacin, statins, tricor  [email or web address] ], compazine  [prochlorperazine ], latex, codeine, and metoprolol .  MEDICATIONS:  Current Outpatient Medications  Medication Sig Dispense Refill   adagrasib  (KRAZATI ) 200 MG tablet Take 2 tablets (400 mg total) by mouth 2 (two) times daily. 180 tablet 3   albuterol  (PROVENTIL ) (5 MG/ML) 0.5% nebulizer solution Take 2.5 mg by nebulization every 6 (six) hours as needed for wheezing.     albuterol  (VENTOLIN  HFA) 108 (90 Base) MCG/ACT inhaler Inhale 1 puff into the lungs every 6 (six) hours as needed (wheezing/shortness of breath.).      aspirin  81 MG chewable tablet Chew 1 tablet (81 mg total) by mouth daily.     Budeson-Glycopyrrol-Formoterol  (BREZTRI  AEROSPHERE) 160-9-4.8 MCG/ACT AERO Inhale 2 puffs into the lungs in the morning and at bedtime. 10.7 g 5   buPROPion  (WELLBUTRIN  XL) 150 MG 24 hr tablet Take 1-2 tablets by mouth 2 (two) times daily. Patient takes 2 tablets (300mg ) in the morning and 1 tablet (150mg ) in the evening     doxycycline  (VIBRA -TABS) 100 MG tablet Take 1 tablet (100 mg total) by mouth 2 (two) times daily. 20 tablet 0   ferrous sulfate  (FEROSUL) 325 (65 FE) MG tablet TAKE 1 TABLET(325 MG) BY MOUTH DAILY WITH BREAKFAST 30 tablet 2   gabapentin  (NEURONTIN ) 100 MG capsule TAKE 1 CAPSULE(100 MG) BY MOUTH THREE TIMES DAILY 90 capsule 2   ipratropium-albuterol  (DUONEB) 0.5-2.5 (3) MG/3ML SOLN Inhale 3 mLs into the lungs every 6 (six) hours as needed (Asthma). 360 mL 3   loperamide  (IMODIUM  A-D) 2 MG tablet Take 1 tablet (2 mg total) by mouth 4 (four) times daily as needed for diarrhea or loose stools. 20 tablet 0   LORazepam  (ATIVAN ) 2 MG tablet Take 2 mg by mouth every evening.     metoprolol  tartrate (LOPRESSOR ) 25 MG tablet Take 0.5 tablets (12.5 mg total) by mouth 2 (two) times daily. 90 tablet 3   nitroGLYCERIN  (NITROSTAT ) 0.4 MG SL tablet Place 1 tablet (0.4 mg total)  under the tongue every 5 (five) minutes as needed for chest pain. 25 tablet 12   ondansetron  (ZOFRAN -ODT) 4 MG disintegrating tablet Take 1 tablet (4 mg total) by mouth every 8 (eight) hours as needed for nausea or vomiting. 30 tablet 2   No current facility-administered medications for this visit.    SURGICAL HISTORY:  Past Surgical History:  Procedure Laterality Date   CARDIAC CATHETERIZATION  04/2008; 05/2008   CARDIAC CATHETERIZATION N/A 08/07/2015   Procedure: Left Heart Cath and Coronary Angiography;  Surgeon: Arnoldo Lapping, MD;  Location: [facility] INVASIVE CV LAB;  Service: Cardiovascular;  Laterality: N/A;   CHOLECYSTECTOMY  CORONARY ANGIOPLASTY WITH STENT PLACEMENT  04/2008 12/06/2012   "1 + 1" (12/06/2012)   CORONARY STENT PLACEMENT  05/08/2013   DES TO LAD      DR MCALHANY   IR IMAGING GUIDED PORT INSERTION  04/29/2018   LEFT HEART CATHETERIZATION WITH CORONARY ANGIOGRAM N/A 12/06/2012   Procedure: LEFT HEART CATHETERIZATION WITH CORONARY ANGIOGRAM;  Surgeon: Odie Benne, MD;  Location[facility] CATH LAB;  Service: Cardiovascular;  Laterality: N/A;   LEFT HEART CATHETERIZATION WITH CORONARY ANGIOGRAM N/A 05/08/2013   Procedure: LEFT HEART CATHETERIZATION WITH CORONARY ANGIOGRAM;  Surgeon: Odie Benne, MD;  Location: [facility] CATH LAB;  Service: Cardiovascular;  Laterality: N/A;   LEFT HEART CATHETERIZATION WITH CORONARY ANGIOGRAM N/A 01/04/2014   Procedure: LEFT HEART CATHETERIZATION WITH CORONARY ANGIOGRAM;  Surgeon: Odie Benne, MD;  Location: [facility] CATH LAB;  Service: Cardiovascular;  Laterality: N/A;   PERCUTANEOUS CORONARY STENT INTERVENTION (PCI-S)  12/06/2012   Procedure: PERCUTANEOUS CORONARY STENT INTERVENTION (PCI-S);  Surgeon: Odie Benne, MD;  Location: [facility] CATH LAB;  Service: Cardiovascular;;   PERCUTANEOUS CORONARY STENT INTERVENTION (PCI-S)  05/08/2013   Procedure: PERCUTANEOUS CORONARY STENT INTERVENTION (PCI-S);  Surgeon: Odie Benne, MD;   Location: [facility] CATH LAB;  Service: Cardiovascular;;  mid LAD    TUBAL LIGATION     VAGINAL HYSTERECTOMY     VIDEO BRONCHOSCOPY WITH ENDOBRONCHIAL NAVIGATION N/A 02/17/2018   Procedure: VIDEO BRONCHOSCOPY WITH ENDOBRONCHIAL NAVIGATION;  Surgeon: Zelphia Higashi, MD;  Location: [facility] OR;  Service: Thoracic;  Laterality: N/A;    REVIEW OF SYSTEMS:   Review of Systems  Constitutional: Negative for appetite change, chills, fatigue, fever and unexpected weight change.  HENT:   Negative for mouth sores, nosebleeds, sore throat and trouble swallowing.   Eyes: Negative for eye problems and icterus.  Respiratory: Negative for cough, hemoptysis, shortness of breath and wheezing.   Cardiovascular: Negative for chest pain and leg swelling.  Gastrointestinal: Negative for abdominal pain, constipation, diarrhea, nausea and vomiting.  Genitourinary: Negative for bladder incontinence, difficulty urinating, dysuria, frequency and hematuria.   Musculoskeletal: Negative for back pain, gait problem, neck pain and neck stiffness.  Skin: Negative for itching and rash.  Neurological: Negative for dizziness, extremity weakness, gait problem, headaches, light-headedness and seizures.  Hematological: Negative for adenopathy. Does not bruise/bleed easily.  Psychiatric/Behavioral: Negative for confusion, depression and sleep disturbance. The patient is not nervous/anxious.     PHYSICAL EXAMINATION:  There were no vitals taken for this visit.  ECOG PERFORMANCE STATUS: {CHL ONC ECOG [id number]  Physical Exam  Constitutional: Oriented to person, place, and time and well-developed, well-nourished, and in no distress. No distress.  HENT:  Head: Normocephalic and atraumatic.  Mouth/Throat: Oropharynx is clear and moist. No oropharyngeal exudate.  Eyes: Conjunctivae are normal. Right eye exhibits no discharge. Left eye exhibits no discharge. No scleral icterus.  Neck: Normal range of motion. Neck supple.   Cardiovascular: Normal rate, regular rhythm, normal heart sounds and intact distal pulses.   Pulmonary/Chest: Effort normal and breath sounds normal. No respiratory distress. No wheezes. No rales.  Abdominal: Soft. Bowel sounds are normal. Exhibits no distension and no mass. There is no tenderness.  Musculoskeletal: Normal range of motion. Exhibits no edema.  Lymphadenopathy:    No cervical adenopathy.  Neurological: Alert and oriented to person, place, and time. Exhibits normal muscle tone. Gait normal. Coordination normal.  Skin: Skin is warm and dry. No rash noted. Not diaphoretic. No erythema. No pallor.  Psychiatric: Mood, memory and judgment  normal.  Vitals reviewed.  LABORATORY DATA: Lab Results  Component Value Date   WBC 7.1 04/28/2023   HGB 12.6 04/28/2023   HCT 38.9 04/28/2023   MCV 95.1 04/28/2023   PLT 179 04/28/2023      Chemistry      Component Value Date/Time   NA 141 04/28/2023 1145   NA 143 01/10/2018 1629   K 4.3 04/28/2023 1145   CL 105 04/28/2023 1145   CO2 31 04/28/2023 1145   BUN 29 (H) 04/28/2023 1145   BUN 14 01/10/2018 1629   CREATININE 1.29 (H) 04/28/2023 1145   CREATININE 0.68 08/05/2015 1456      Component Value Date/Time   CALCIUM  9.3 04/28/2023 1145   ALKPHOS 83 04/28/2023 1145   AST 30 04/28/2023 1145   ALT 26 04/28/2023 1145   BILITOT 0.6 04/28/2023 1145       RADIOGRAPHIC STUDIES:  No results found.   ASSESSMENT/PLAN:  This is a very pleasant 68 year old Caucasian female diagnosed with stage IV non-small cell lung cancer, adenocarcinoma. She is negative for any actionable mutations and her PD-L1 expression is 1%. She was diagnosed in December 2019. She presented with bilateral pulmonary nodules.    She initially started treatment with carboplatin , Alimta , and Keytruda . She was status post 4 cycles but Keytruda  was discontinued after cycle #2 due to his significant skin rash. She then was treated with maintenance single agent  Alimta  for 3 cycles. This has been on hold since June 2020 due to worsening renal insufficiency.   She has been on observation since that time and feeling fairly well except she does have significant COPD for which she is on supplemental oxygen. She follows closely with pulmonology.    In November 2023 the patient had a repeat CT scan which showed new area of nodularity with potential endobronchial extension and lymph node adjacent to the endobronchial material in the right hilu that is suspicious and a new solid nodule in the medial right lower lobe concerning for additional site of disease.    She had a PET scan which showed evidence of disease progression with multiple bilateral solid and subsolid nodules.   Due to the patient's history of intolerance to chemotherapy she was started on second line targeted treatment with Krazati  600 mg twice daily.  Her first dose was on 02/20/22.  Dose was reduced to 400 mg twice daily around 03/03/2022 due to ongoing symptoms of nausea, vomiting, and diarrhea. She resumed this on 05/07/22.   She was lost to follow-up since August 2024 due to being in [place]  for the birth of her great grandchild.  The patient was seen with Dr. Marguerita Shih today.  Dr. Marguerita Shih personally and independently reviewed the scan and discussed results with the patient today.  The scan showed ***.  Dr. Marguerita Shih recommends ***      We will see her back for follow-up visit in approximately 4 weeks for evaluation and repeat labs.    She will continue with imodium  for diarrhea. She will continue with her anti-emetic if needed.   The patient was advised to call immediately if she has any concerning symptoms in the interval. The patient voices understanding of current disease status and treatment options and is in agreement with the current care plan. All questions were answered. The patient knows to call the clinic with any problems, questions or concerns. We can certainly see  the patient much sooner if necessary    No orders of the defined types  were placed in this encounter.    I spent {CHL ONC TIME VISIT - [id number]} counseling the patient face to face. The total time spent in the appointment was {CHL ONC TIME VISIT - BJYNW:[id number]}.  Dinari Stgermaine L Keelan Tripodi, PA-C 05/22/23

## 2023-05-25 ENCOUNTER — Other Ambulatory Visit: Payer: Self-pay | Admitting: Physician Assistant

## 2023-05-25 ENCOUNTER — Other Ambulatory Visit (HOSPITAL_COMMUNITY): Payer: Self-pay

## 2023-05-25 ENCOUNTER — Other Ambulatory Visit: Payer: Self-pay

## 2023-05-25 ENCOUNTER — Telehealth: Payer: Self-pay | Admitting: Physician Assistant

## 2023-05-25 MED ORDER — KRAZATI 200 MG PO TABS
400.0000 mg | ORAL_TABLET | Freq: Two times a day (BID) | ORAL | 3 refills | Status: DC
  Filled 2023-05-25: qty 120, 30d supply, fill #0
  Filled 2023-06-23 (×2): qty 120, 30d supply, fill #1
  Filled 2023-07-14 (×2): qty 120, 30d supply, fill #2
  Filled 2023-08-16: qty 120, 30d supply, fill #3
  Filled 2023-09-06 – 2023-09-10 (×3): qty 120, 30d supply, fill #4
  Filled 2023-09-28: qty 120, 30d supply, fill #5

## 2023-05-25 NOTE — Progress Notes (Deleted)
 [facility] OFFICE PROGRESS NOTE  Diana Confer, NP [address] [place] [address]  DIAGNOSIS: Stage IV (T1a, N0, M1 a) non-small cell lung cancer, adenocarcinoma presented with multifocal disease in both lungs including 3 nodules in the left lung as well as 2 nodules in the right lung diagnosed in December 2019.     Molecular studies showed: KRAS G12C BCOR [id number]-subclonal RAD21 amplification   PDL 1 expression 1%  PRIOR THERAPY:  1) Systemic chemotherapy with carboplatin for AUC of 5, Alimta 500 mg/M2 and Keytruda 200 mg IV every 3 weeks.  First dose April 12, 2018. Status post 4 cycles. starting from cycle #3, Keytruda was dropped and patient received treatment with Carboplatin for an AUC of 5 and Alimta 500 mg/m2 IV every 3 weeks.  2) Systemic chemotherapy with Alimta 500 mg/m2 IV every 3 weeks. Status post 3 cycles of single agent Alimta. Most recent dose given on 08/23/2018.  The treatment is currently on hold secondary to renal insufficiency  CURRENT THERAPY: Krazati 600 mg BID, first dose on 02/20/22. Dose reduced to 400 mg BID starting on 03/10/22. However, the patient states this was on hold since she was in the hospital on 03/15/22 and she had not resumed it until 05/07/22.   INTERVAL HISTORY: [name] 68 y.o. female returns to clinic today for follow-up visit.  The patient was last seen in clinic by myself on 04/28/2023.  The patient is currently on a reduced dose of treatment with [place].  She tolerates this well without any adverse side effects.  In general the patient does have a lot of unusual symptoms and side effects that have been going on predating [place].  For example she has been having nausea and vomiting and intermittent diarrhea ongoing even while off treatment on observation for several years.  The diarrhea started after having her gallbladder removed.  She has not noticed any changes in the frequency or severity of her diarrhea since  being on [place].  She has IBS.  She also has fibromyalgia and has chronic multifocal joint pain.  She has COPD and is on supplemental oxygen at baseline.  She also has coronary artery disease.  Last appointment urinary tract infection.  She denies any fevers.  Chills?  Weight is stable?  She takes Imodium daily to help keep her diarrhea at bay.  She reports dyspnea exertion at baseline for which she wears supplemental oxygen.  She has an intermittent cough that comes and goes and takes Robitussin.  Denies any hemoptysis.  She has headaches frequently but?  Otherwise she denies any?  She denies any rashes or skin changes.  She recently had a restaging CT scan.  She is here today for evaluation and to review her scan results. MEDICAL HISTORY: Past Medical History:  Diagnosis Date   Anemia    "long time ago" (12/06/2012)   Anxiety    Arthritis    "right knee real bad; in my hands bad" (12/06/2012)   Asthma    Celiac disease    Colon polyps    COPD (chronic obstructive pulmonary disease) (HCC)    Coronary artery disease    a. s/p Xience DES to [facility] 04/2008;  b. LHC 05/2008: Proximal RCA 25%, mid RCA stent patent.  c. Anormal nuc 2014 -> s/p LHC with severe mRCA stenosis s/p DES. d. Cath 04/2013 s/p DES to LAD.  e. LHC 08/2015 was stable.   Depression    "years ago" (12/06/2012)  Fibromyalgia    Gallstones    GERD (gastroesophageal reflux disease)    H/O hiatal hernia    Hepatitis    "not A, B, or C" (12/06/2012)   Hyperlipidemia    intol to statins and other chol agents due to elevated LFTs   Hypertension    Hypothyroidism    IBS (irritable bowel syndrome)    Interstitial cystitis    Lung cancer (HCC) dx'd 01/2018   Lung nodules    Bilateral   Migraines    "when I was younger" (12/06/2012)   Pneumonia    PONV (postoperative nausea and vomiting)    "Very bad"   Premature atrial contractions    PVC's (premature ventricular contractions)    Sinus headache    SVT (supraventricular  tachycardia) (HCC)     ALLERGIES:  is allergic to ciprofloxacin, levofloxacin, statins, tricor [fenofibrate], compazine [prochlorperazine], latex, codeine, and metoprolol.  MEDICATIONS:  Current Outpatient Medications  Medication Sig Dispense Refill   adagrasib (KRAZATI) 200 MG tablet Take 2 tablets (400 mg total) by mouth 2 (two) times daily. 180 tablet 3   albuterol (PROVENTIL) (5 MG/ML) 0.5% nebulizer solution Take 2.5 mg by nebulization every 6 (six) hours as needed for wheezing.     albuterol (VENTOLIN HFA) 108 (90 Base) MCG/ACT inhaler Inhale 1 puff into the lungs every 6 (six) hours as needed (wheezing/shortness of breath.).      aspirin 81 MG chewable tablet Chew 1 tablet (81 mg total) by mouth daily.     Budeson-Glycopyrrol-Formoterol (BREZTRI AEROSPHERE) 160-9-4.8 MCG/ACT AERO Inhale 2 puffs into the lungs in the morning and at bedtime. 10.7 g 5   buPROPion (WELLBUTRIN XL) 150 MG 24 hr tablet Take 1-2 tablets by mouth 2 (two) times daily. Patient takes 2 tablets (300mg ) in the morning and 1 tablet (150mg ) in the evening     doxycycline (VIBRA-TABS) 100 MG tablet Take 1 tablet (100 mg total) by mouth 2 (two) times daily. 20 tablet 0   ferrous sulfate (FEROSUL) 325 (65 FE) MG tablet TAKE 1 TABLET(325 MG) BY MOUTH DAILY WITH BREAKFAST 30 tablet 2   gabapentin (NEURONTIN) 100 MG capsule TAKE 1 CAPSULE(100 MG) BY MOUTH THREE TIMES DAILY 90 capsule 2   ipratropium-albuterol (DUONEB) 0.5-2.5 (3) MG/3ML SOLN Inhale 3 mLs into the lungs every 6 (six) hours as needed (Asthma). 360 mL 3   loperamide (IMODIUM A-D) 2 MG tablet Take 1 tablet (2 mg total) by mouth 4 (four) times daily as needed for diarrhea or loose stools. 20 tablet 0   LORazepam (ATIVAN) 2 MG tablet Take 2 mg by mouth every evening.     metoprolol tartrate (LOPRESSOR) 25 MG tablet Take 0.5 tablets (12.5 mg total) by mouth 2 (two) times daily. 90 tablet 3   nitroGLYCERIN (NITROSTAT) 0.4 MG SL tablet Place 1 tablet (0.4 mg total)  under the tongue every 5 (five) minutes as needed for chest pain. 25 tablet 12   ondansetron (ZOFRAN-ODT) 4 MG disintegrating tablet Take 1 tablet (4 mg total) by mouth every 8 (eight) hours as needed for nausea or vomiting. 30 tablet 2   No current facility-administered medications for this visit.    SURGICAL HISTORY:  Past Surgical History:  Procedure Laterality Date   CARDIAC CATHETERIZATION  04/2008; 05/2008   CARDIAC CATHETERIZATION N/A 08/07/2015   Procedure: Left Heart Cath and Coronary Angiography;  Surgeon: Tonny Bollman, MD;  Location: [facility] INVASIVE CV LAB;  Service: Cardiovascular;  Laterality: N/A;   CHOLECYSTECTOMY  CORONARY ANGIOPLASTY WITH STENT PLACEMENT  04/2008 12/06/2012   "1 + 1" (12/06/2012)   CORONARY STENT PLACEMENT  05/08/2013   DES TO LAD      DR MCALHANY   IR IMAGING GUIDED PORT INSERTION  04/29/2018   LEFT HEART CATHETERIZATION WITH CORONARY ANGIOGRAM N/A 12/06/2012   Procedure: LEFT HEART CATHETERIZATION WITH CORONARY ANGIOGRAM;  Surgeon: Kathleene Hazel, MD;  Location: [facility] CATH LAB;  Service: Cardiovascular;  Laterality: N/A;   LEFT HEART CATHETERIZATION WITH CORONARY ANGIOGRAM N/A 05/08/2013   Procedure: LEFT HEART CATHETERIZATION WITH CORONARY ANGIOGRAM;  Surgeon: Kathleene Hazel, MD;  Location: [facility] CATH LAB;  Service: Cardiovascular;  Laterality: N/A;   LEFT HEART CATHETERIZATION WITH CORONARY ANGIOGRAM N/A 01/04/2014   Procedure: LEFT HEART CATHETERIZATION WITH CORONARY ANGIOGRAM;  Surgeon: Kathleene Hazel, MD;  Location: [facility] CATH LAB;  Service: Cardiovascular;  Laterality: N/A;   PERCUTANEOUS CORONARY STENT INTERVENTION (PCI-S)  12/06/2012   Procedure: PERCUTANEOUS CORONARY STENT INTERVENTION (PCI-S);  Surgeon: Kathleene Hazel, MD;  Location: [facility] CATH LAB;  Service: Cardiovascular;;   PERCUTANEOUS CORONARY STENT INTERVENTION (PCI-S)  05/08/2013   Procedure: PERCUTANEOUS CORONARY STENT INTERVENTION (PCI-S);  Surgeon: Kathleene Hazel, MD;   Location: [facility] CATH LAB;  Service: Cardiovascular;;  mid LAD    TUBAL LIGATION     VAGINAL HYSTERECTOMY     VIDEO BRONCHOSCOPY WITH ENDOBRONCHIAL NAVIGATION N/A 02/17/2018   Procedure: VIDEO BRONCHOSCOPY WITH ENDOBRONCHIAL NAVIGATION;  Surgeon: Loreli Slot, MD;  Location: [facility] OR;  Service: Thoracic;  Laterality: N/A;    REVIEW OF SYSTEMS:   Review of Systems  Constitutional: Negative for appetite change, chills, fatigue, fever and unexpected weight change.  HENT:   Negative for mouth sores, nosebleeds, sore throat and trouble swallowing.   Eyes: Negative for eye problems and icterus.  Respiratory: Negative for cough, hemoptysis, shortness of breath and wheezing.   Cardiovascular: Negative for chest pain and leg swelling.  Gastrointestinal: Negative for abdominal pain, constipation, diarrhea, nausea and vomiting.  Genitourinary: Negative for bladder incontinence, difficulty urinating, dysuria, frequency and hematuria.   Musculoskeletal: Negative for back pain, gait problem, neck pain and neck stiffness.  Skin: Negative for itching and rash.  Neurological: Negative for dizziness, extremity weakness, gait problem, headaches, light-headedness and seizures.  Hematological: Negative for adenopathy. Does not bruise/bleed easily.  Psychiatric/Behavioral: Negative for confusion, depression and sleep disturbance. The patient is not nervous/anxious.     PHYSICAL EXAMINATION:  There were no vitals taken for this visit.  ECOG PERFORMANCE STATUS: {CHL ONC ECOG [id number]  Physical Exam  Constitutional: Oriented to person, place, and time and well-developed, well-nourished, and in no distress. No distress.  HENT:  Head: Normocephalic and atraumatic.  Mouth/Throat: Oropharynx is clear and moist. No oropharyngeal exudate.  Eyes: Conjunctivae are normal. Right eye exhibits no discharge. Left eye exhibits no discharge. No scleral icterus.  Neck: Normal range of motion. Neck supple.   Cardiovascular: Normal rate, regular rhythm, normal heart sounds and intact distal pulses.   Pulmonary/Chest: Effort normal and breath sounds normal. No respiratory distress. No wheezes. No rales.  Abdominal: Soft. Bowel sounds are normal. Exhibits no distension and no mass. There is no tenderness.  Musculoskeletal: Normal range of motion. Exhibits no edema.  Lymphadenopathy:    No cervical adenopathy.  Neurological: Alert and oriented to person, place, and time. Exhibits normal muscle tone. Gait normal. Coordination normal.  Skin: Skin is warm and dry. No rash noted. Not diaphoretic. No erythema. No pallor.  Psychiatric: Mood, memory and judgment  normal.  Vitals reviewed.  LABORATORY DATA: Lab Results  Component Value Date   WBC 7.1 04/28/2023   HGB 12.6 04/28/2023   HCT 38.9 04/28/2023   MCV 95.1 04/28/2023   PLT 179 04/28/2023      Chemistry      Component Value Date/Time   NA 141 04/28/2023 1145   NA 143 01/10/2018 1629   K 4.3 04/28/2023 1145   CL 105 04/28/2023 1145   CO2 31 04/28/2023 1145   BUN 29 (H) 04/28/2023 1145   BUN 14 01/10/2018 1629   CREATININE 1.29 (H) 04/28/2023 1145   CREATININE 0.68 08/05/2015 1456      Component Value Date/Time   CALCIUM 9.3 04/28/2023 1145   ALKPHOS 83 04/28/2023 1145   AST 30 04/28/2023 1145   ALT 26 04/28/2023 1145   BILITOT 0.6 04/28/2023 1145       RADIOGRAPHIC STUDIES:  No results found.   ASSESSMENT/PLAN:  This is a very pleasant 68 year old Caucasian female diagnosed with stage IV non-small cell lung cancer, adenocarcinoma. She is negative for any actionable mutations and her PD-L1 expression is 1%. She was diagnosed in December 2019. She presented with bilateral pulmonary nodules.    She initially started treatment with carboplatin, Alimta, and Keytruda. She was status post 4 cycles but Keytruda was discontinued after cycle #2 due to his significant skin rash. She then was treated with maintenance single agent  Alimta for 3 cycles. This has been on hold since June 2020 due to worsening renal insufficiency.   She has been on observation since that time and feeling fairly well except she does have significant COPD for which she is on supplemental oxygen. She follows closely with pulmonology.    In November 2023 the patient had a repeat CT scan which showed new area of nodularity with potential endobronchial extension and lymph node adjacent to the endobronchial material in the right hilu that is suspicious and a new solid nodule in the medial right lower lobe concerning for additional site of disease.    She had a PET scan which showed evidence of disease progression with multiple bilateral solid and subsolid nodules.   Due to the patient's history of intolerance to chemotherapy she was started on second line targeted treatment with Krazati 600 mg twice daily.  Her first dose was on 02/20/22.  Dose was reduced to 400 mg twice daily around 03/03/2022 due to ongoing symptoms of nausea, vomiting, and diarrhea. She resumed this on 05/07/22.   She was lost to follow-up since August 2024 due to being in [facility] for the birth of her great grandchild.  The patient was seen with Dr. Arbutus Ped today.  Dr. Arbutus Ped personally and independently reviewed the scan and discussed results with the patient today.  The scan showed ***.  Dr. Arbutus Ped recommends ***      We will see her back for follow-up visit in approximately 4 weeks for evaluation and repeat labs.    She will continue with imodium for diarrhea. She will continue with her anti-emetic if needed.   The patient was advised to call immediately if she has any concerning symptoms in the interval. The patient voices understanding of current disease status and treatment options and is in agreement with the current care plan. All questions were answered. The patient knows to call the clinic with any problems, questions or concerns. We can certainly see  the patient much sooner if necessary    No orders of the defined types  were placed in this encounter.    I spent {CHL ONC TIME VISIT - [id number]} counseling the patient face to face. The total time spent in the appointment was {CHL ONC TIME VISIT - ZHYQM:[id number]}.  Macedonio Scallon L Somaly Marteney, PA-C 05/25/23

## 2023-05-25 NOTE — Telephone Encounter (Signed)
 Patient called to reschedule appointments and is aware of the changes made.

## 2023-05-25 NOTE — Progress Notes (Signed)
 Specialty Pharmacy Refill Coordination Note  [name] is a 68 y.o. female contacted today regarding refills of specialty medication(s) Adagrasib [name])   Patient requested (Patient-Rptd) Delivery   Delivery date: (Patient-Rptd) 05/27/23   Verified address: (Patient-Rptd) [address], [place] [address]   Medication will be filled on 05/26/23.  This fill date is pending response to refill request from provider. Patient is aware and if they have not received fill by intended date they must follow up with pharmacy.

## 2023-05-26 ENCOUNTER — Inpatient Hospital Stay: Payer: Managed Care, Other (non HMO) | Admitting: Physician Assistant

## 2023-05-26 ENCOUNTER — Inpatient Hospital Stay: Payer: Managed Care, Other (non HMO)

## 2023-05-28 ENCOUNTER — Other Ambulatory Visit: Payer: Self-pay

## 2023-05-28 DIAGNOSIS — C3492 Malignant neoplasm of unspecified part of left bronchus or lung: Secondary | ICD-10-CM

## 2023-05-31 ENCOUNTER — Telehealth: Payer: Self-pay | Admitting: Physician Assistant

## 2023-05-31 ENCOUNTER — Inpatient Hospital Stay: Admitting: Physician Assistant

## 2023-05-31 ENCOUNTER — Inpatient Hospital Stay

## 2023-05-31 NOTE — Telephone Encounter (Signed)
 Patient called to reschedule appointment due to being sick.

## 2023-06-01 NOTE — Progress Notes (Signed)
 [facility] OFFICE PROGRESS NOTE  [name], NP [address] [place] [address]  DIAGNOSIS: Stage IV (T1a, N0, M1 a) non-small cell lung cancer, adenocarcinoma presented with multifocal disease in both lungs including 3 nodules in the left lung as well as 2 nodules in the right lung diagnosed in December 2019.     Molecular studies showed: KRAS G12C BCOR [id number]-subclonal RAD21 amplification   PDL 1 expression 1%  PRIOR THERAPY:  1) Systemic chemotherapy with carboplatin for AUC of 5, Alimta 500 mg/M2 and Keytruda 200 mg IV every 3 weeks.  First dose April 12, 2018. Status post 4 cycles. starting from cycle #3, Keytruda was dropped and patient received treatment with Carboplatin for an AUC of 5 and Alimta 500 mg/m2 IV every 3 weeks.  2) Systemic chemotherapy with Alimta 500 mg/m2 IV every 3 weeks. Status post 3 cycles of single agent Alimta. Most recent dose given on 08/23/2018.  The treatment is currently on hold secondary to renal insufficiency  CURRENT THERAPY: Krazati 600 mg BID, first dose on 02/20/22. Dose reduced to 400 mg BID starting on 03/10/22. However, the patient states this was on hold since she was in the hospital on 03/15/22 and she had not resumed it until 05/07/22.   INTERVAL HISTORY: [name] 68 y.o. female returns to clinic today for follow-up visit.  The patient was last seen in clinic by myself on 04/28/2023.  The patient is currently on a reduced dose of treatment with [place].  She tolerates this well without any adverse side effects.  In general the patient does have a lot of unusual symptoms and side effects that have been going on predating [place] and poor overall health due to her COPD.  For example she has been having nausea and vomiting and intermittent diarrhea ongoing even while off treatment on observation for several years.  The diarrhea started after having her gallbladder removed.  She has not noticed any changes in the  frequency or severity of her diarrhea since being on [place].  She has IBS.  She also has fibromyalgia and has chronic multifocal joint pain.  She has COPD and is on supplemental oxygen at baseline.  She also has coronary artery disease. She does feel like her shortness of breath is worse. Her intermittent cough is stable. She saw a neurologist recently and she tells me they think she may have MS. She has fallen a few times. She has a walker if needed. She also carries around an oxygen take. She also struggles with depression. She does not have good quality of life due to her activity restrictions due to her breathing.  She denies any fevers.  She struggles with intermittent chills at baseline.  She has lost weight.  She denies any changes with her intermittent cough.  Denies any hemoptysis.  Denies any changes with headaches or vision changes.  She does sometimes have vomiting with her associated nausea.  She denies any rashes.  She recently had a restaging CT scan.  She is here today for evaluation and to review her scan results.    MEDICAL HISTORY: Past Medical History:  Diagnosis Date   Anemia    "long time ago" (12/06/2012)   Anxiety    Arthritis    "right knee real bad; in my hands bad" (12/06/2012)   Asthma    Celiac disease    Colon polyps    COPD (chronic obstructive pulmonary disease) (HCC)    Coronary artery  disease    a. s/p Xience DES to [facility] 04/2008;  b. LHC 05/2008: Proximal RCA 25%, mid RCA stent patent.  c. Anormal nuc 2014 -> s/p LHC with severe mRCA stenosis s/p DES. d. Cath 04/2013 s/p DES to LAD.  e. LHC 08/2015 was stable.   Depression    "years ago" (12/06/2012)   Fibromyalgia    Gallstones    GERD (gastroesophageal reflux disease)    H/O hiatal hernia    Hepatitis    "not A, B, or C" (12/06/2012)   Hyperlipidemia    intol to statins and other chol agents due to elevated LFTs   Hypertension    Hypothyroidism    IBS (irritable bowel syndrome)    Interstitial cystitis     Lung cancer (HCC) dx'd 01/2018   Lung nodules    Bilateral   Migraines    "when I was younger" (12/06/2012)   Pneumonia    PONV (postoperative nausea and vomiting)    "Very bad"   Premature atrial contractions    PVC's (premature ventricular contractions)    Sinus headache    SVT (supraventricular tachycardia) (HCC)     ALLERGIES:  is allergic to ciprofloxacin, levofloxacin, statins, tricor [fenofibrate], compazine [prochlorperazine], latex, codeine, and metoprolol.  MEDICATIONS:  Current Outpatient Medications  Medication Sig Dispense Refill   adagrasib (KRAZATI) 200 MG tablet Take 2 tablets (400 mg total) by mouth 2 (two) times daily. 180 tablet 3   albuterol (PROVENTIL) (5 MG/ML) 0.5% nebulizer solution Take 2.5 mg by nebulization every 6 (six) hours as needed for wheezing.     albuterol (VENTOLIN HFA) 108 (90 Base) MCG/ACT inhaler Inhale 1 puff into the lungs every 6 (six) hours as needed (wheezing/shortness of breath.).      aspirin 81 MG chewable tablet Chew 1 tablet (81 mg total) by mouth daily.     Budeson-Glycopyrrol-Formoterol (BREZTRI AEROSPHERE) 160-9-4.8 MCG/ACT AERO Inhale 2 puffs into the lungs in the morning and at bedtime. 10.7 g 5   buPROPion (WELLBUTRIN XL) 150 MG 24 hr tablet Take 1-2 tablets by mouth 2 (two) times daily. Patient takes 2 tablets (300mg ) in the morning and 1 tablet (150mg ) in the evening     doxycycline (VIBRA-TABS) 100 MG tablet Take 1 tablet (100 mg total) by mouth 2 (two) times daily. 20 tablet 0   ferrous sulfate (FEROSUL) 325 (65 FE) MG tablet TAKE 1 TABLET(325 MG) BY MOUTH DAILY WITH BREAKFAST 30 tablet 2   gabapentin (NEURONTIN) 100 MG capsule TAKE 1 CAPSULE(100 MG) BY MOUTH THREE TIMES DAILY 90 capsule 2   ipratropium-albuterol (DUONEB) 0.5-2.5 (3) MG/3ML SOLN Inhale 3 mLs into the lungs every 6 (six) hours as needed (Asthma). 360 mL 3   loperamide (IMODIUM A-D) 2 MG tablet Take 1 tablet (2 mg total) by mouth 4 (four) times daily as needed for  diarrhea or loose stools. 20 tablet 0   LORazepam (ATIVAN) 2 MG tablet Take 2 mg by mouth every evening.     metoprolol tartrate (LOPRESSOR) 25 MG tablet Take 0.5 tablets (12.5 mg total) by mouth 2 (two) times daily. 90 tablet 3   nitroGLYCERIN (NITROSTAT) 0.4 MG SL tablet Place 1 tablet (0.4 mg total) under the tongue every 5 (five) minutes as needed for chest pain. 25 tablet 12   ondansetron (ZOFRAN-ODT) 4 MG disintegrating tablet Take 1 tablet (4 mg total) by mouth every 8 (eight) hours as needed for nausea or vomiting. 30 tablet 2   No current facility-administered medications for this  visit.    SURGICAL HISTORY:  Past Surgical History:  Procedure Laterality Date   CARDIAC CATHETERIZATION  04/2008; 05/2008   CARDIAC CATHETERIZATION N/A 08/07/2015   Procedure: Left Heart Cath and Coronary Angiography;  Surgeon: Tonny Bollman, MD;  Location: [facility] INVASIVE CV LAB;  Service: Cardiovascular;  Laterality: N/A;   CHOLECYSTECTOMY     CORONARY ANGIOPLASTY WITH STENT PLACEMENT  04/2008 12/06/2012   "1 + 1" (12/06/2012)   CORONARY STENT PLACEMENT  05/08/2013   DES TO LAD      DR MCALHANY   IR IMAGING GUIDED PORT INSERTION  04/29/2018   LEFT HEART CATHETERIZATION WITH CORONARY ANGIOGRAM N/A 12/06/2012   Procedure: LEFT HEART CATHETERIZATION WITH CORONARY ANGIOGRAM;  Surgeon: Kathleene Hazel, MD;  Location: [facility] CATH LAB;  Service: Cardiovascular;  Laterality: N/A;   LEFT HEART CATHETERIZATION WITH CORONARY ANGIOGRAM N/A 05/08/2013   Procedure: LEFT HEART CATHETERIZATION WITH CORONARY ANGIOGRAM;  Surgeon: Kathleene Hazel, MD;  Location: [facility] CATH LAB;  Service: Cardiovascular;  Laterality: N/A;   LEFT HEART CATHETERIZATION WITH CORONARY ANGIOGRAM N/A 01/04/2014   Procedure: LEFT HEART CATHETERIZATION WITH CORONARY ANGIOGRAM;  Surgeon: Kathleene Hazel, MD;  Location: [facility] CATH LAB;  Service: Cardiovascular;  Laterality: N/A;   PERCUTANEOUS CORONARY STENT INTERVENTION (PCI-S)  12/06/2012    Procedure: PERCUTANEOUS CORONARY STENT INTERVENTION (PCI-S);  Surgeon: Kathleene Hazel, MD;  Location: [facility] CATH LAB;  Service: Cardiovascular;;   PERCUTANEOUS CORONARY STENT INTERVENTION (PCI-S)  05/08/2013   Procedure: PERCUTANEOUS CORONARY STENT INTERVENTION (PCI-S);  Surgeon: Kathleene Hazel, MD;  Location: [facility] CATH LAB;  Service: Cardiovascular;;  mid LAD    TUBAL LIGATION     VAGINAL HYSTERECTOMY     VIDEO BRONCHOSCOPY WITH ENDOBRONCHIAL NAVIGATION N/A 02/17/2018   Procedure: VIDEO BRONCHOSCOPY WITH ENDOBRONCHIAL NAVIGATION;  Surgeon: Loreli Slot, MD;  Location: [facility] OR;  Service: Thoracic;  Laterality: N/A;    REVIEW OF SYSTEMS:   Constitutional: Positive for stable fatigue and weight loss. Negative for appetite change, chills, and fever.  HENT: Negative for mouth sores, nosebleeds, sore throat and trouble swallowing.   Eyes: Negative for eye problems and icterus.  Respiratory: Positive for dyspnea on exertion and intermittent cough. Negative for hemoptysis, and wheezing.   Cardiovascular: Intermittent left shoulder/rib pain. Negative for leg swelling.  Gastrointestinal: Positive for chronic diarrhea and intermittent nausea/vomiting. Negative for abdominal pain and  constipation.  Genitourinary:  Negative for bladder incontinence and hematuria.   Musculoskeletal: Positive for multifocal joint pain (osteoarthritis and fibromyalgia). Negative for gait problem, neck pain and neck stiffness.  Skin: Negative for itching and rash.  Neurological: Positive for occasional dizziness. Negative for extremity weakness and seizures.  Hematological: Negative for adenopathy. Does not bruise/bleed easily.  Psychiatric/Behavioral: Negative for confusion, depression and sleep disturbance. The patient is not nervous/anxious.       PHYSICAL EXAMINATION:  There were no vitals taken for this visit.  ECOG PERFORMANCE STATUS:2-3  Physical Exam  Constitutional: Oriented to person, place,  and time and chronically ill-appearing female and in no distress.  HENT:  Head: Normocephalic and atraumatic.  Mouth/Throat: Oropharynx is clear and moist. No oropharyngeal exudate.  Eyes: Conjunctivae are normal. Right eye exhibits no discharge. Left eye exhibits no discharge. No scleral icterus.  Neck: Normal range of motion. Neck supple Cardiovascular: Normal rate, regular rhythm, normal heart sounds and intact distal pulses.   Pulmonary/Chest: Effort normal.  Quiet breath sounds bilaterally.  On supplemental oxygen.  No respiratory distress. No wheezes. No rales.  Abdominal: Soft.  Bowel sounds are normal. Exhibits no distension and no mass. There is no tenderness.  Musculoskeletal: Normal range of motion. Exhibits no edema.  Lymphadenopathy:    No cervical adenopathy.  Neurological: Alert and oriented to person, place, and time. Exhibits muscle wasting.  Skin: Skin is warm and dry. No rash noted. Not diaphoretic. No erythema. No pallor.  Psychiatric: Mood, memory and judgment normal.  Vitals reviewed.  LABORATORY DATA: Lab Results  Component Value Date   WBC 7.1 04/28/2023   HGB 12.6 04/28/2023   HCT 38.9 04/28/2023   MCV 95.1 04/28/2023   PLT 179 04/28/2023      Chemistry      Component Value Date/Time   NA 141 04/28/2023 1145   NA 143 01/10/2018 1629   K 4.3 04/28/2023 1145   CL 105 04/28/2023 1145   CO2 31 04/28/2023 1145   BUN 29 (H) 04/28/2023 1145   BUN 14 01/10/2018 1629   CREATININE 1.29 (H) 04/28/2023 1145   CREATININE 0.68 08/05/2015 1456      Component Value Date/Time   CALCIUM 9.3 04/28/2023 1145   ALKPHOS 83 04/28/2023 1145   AST 30 04/28/2023 1145   ALT 26 04/28/2023 1145   BILITOT 0.6 04/28/2023 1145       RADIOGRAPHIC STUDIES:  CT CHEST ABDOMEN PELVIS W CONTRAST Result Date: 05/28/2023 CLINICAL DATA:  Adenocarcinoma lung. Surveillance exam. Assess treatment response. EXAM: CT CHEST, ABDOMEN, AND PELVIS WITH CONTRAST TECHNIQUE: Multidetector CT  imaging of the chest, abdomen and pelvis was performed following the standard protocol during bolus administration of intravenous contrast. RADIATION DOSE REDUCTION: This exam was performed according to the departmental dose-optimization program which includes automated exposure control, adjustment of the mA and/or kV according to patient size and/or use of iterative reconstruction technique. CONTRAST:  75mL OMNIPAQUE IOHEXOL 300 MG/ML  SOLN COMPARISON:  02/10/2023 FINDINGS: CT CHEST FINDINGS Cardiovascular: Port in the anterior chest wall with tip in distal SVC. Coronary artery calcification and aortic atherosclerotic calcification. Mediastinum/Nodes: No axillary or supraclavicular adenopathy. No mediastinal or hilar adenopathy. No pericardial fluid. Esophagus normal. Lungs/Pleura: Sub solid nodule in the RIGHT upper lobe measures 21 x 10 mm compared to 21 x 12 mm for no interval change (image 40/series 5). Nodule tents the adjacent pleural surface. Less well-defined ground-glass nodule in the LEFT upper lobe measures 18 mm (image 41/5) compared 18 mm. Ground-glass nodule superior segment of the LEFT lower lobe measures 17 mm also unchanged. No new pulmonary nodules in LEFT or RIGHT lung. Musculoskeletal: No aggressive osseous lesion. CT ABDOMEN AND PELVIS FINDINGS Hepatobiliary: No focal hepatic lesion. Postcholecystectomy. No biliary dilatation. Pancreas: Pancreas is normal. No ductal dilatation. No pancreatic inflammation. Spleen: Normal spleen Adrenals/urinary tract: Adrenal glands and kidneys are normal. The ureters and bladder normal. Stomach/Bowel: Stomach, small bowel, appendix, and cecum are normal. The colon and rectosigmoid colon are normal. Vascular/Lymphatic: Abdominal aorta is normal caliber. There is no retroperitoneal or periportal lymphadenopathy. No pelvic lymphadenopathy. Reproductive: Post hysterectomy.  Adnexa unremarkable Other: No free fluid. Musculoskeletal: No aggressive osseous lesion.  IMPRESSION: CHEST: 1. No change in dominant sub solid nodule in the RIGHT upper lobe. 2. No change in ground-glass nodules in the LEFT upper lobe and LEFT lower lobe. 3. No new pulmonary nodules. 4. No thoracic adenopathy. PELVIS: 1. No evidence of metastatic disease in the abdomen pelvis. Electronically Signed   By: Genevive Bi M.D.   On: 05/28/2023 12:46     ASSESSMENT/PLAN:  This is a very pleasant 68 year old Caucasian female  diagnosed with stage IV non-small cell lung cancer, adenocarcinoma. She is negative for any actionable mutations and her PD-L1 expression is 1%. She was diagnosed in December 2019. She presented with bilateral pulmonary nodules.    She initially started treatment with carboplatin, Alimta, and Keytruda. She was status post 4 cycles but Keytruda was discontinued after cycle #2 due to his significant skin rash. She then was treated with maintenance single agent Alimta for 3 cycles. This has been on hold since June 2020 due to worsening renal insufficiency.   She has been on observation since that time and feeling fairly well except she does have significant COPD for which she is on supplemental oxygen. She follows closely with pulmonology.    In November 2023 the patient had a repeat CT scan which showed new area of nodularity with potential endobronchial extension and lymph node adjacent to the endobronchial material in the right hilu that is suspicious and a new solid nodule in the medial right lower lobe concerning for additional site of disease.    She had a PET scan which showed evidence of disease progression with multiple bilateral solid and subsolid nodules.   Due to the patient's history of intolerance to chemotherapy she was started on second line targeted treatment with Krazati 600 mg twice daily.  Her first dose was on 02/20/22.  Dose was reduced to 400 mg twice daily around 03/03/2022 due to ongoing symptoms of nausea, vomiting, and diarrhea. She resumed this on  05/07/22.   She was lost to follow-up since August 2024 due to being in [facility] for the birth of her great grandchild.  The patient was seen with Dr. Arbutus Ped today.  Dr. Arbutus Ped personally and independently reviewed the scan and discussed results with the patient today.  The scan showed no evidence of disease progression.  Dr. Arbutus Ped recommends she continue on the same treatment at the same dose.    We will see her back for follow-up visit in approximately 8 weeks for evaluation and repeat labs.    She will continue with imodium for diarrhea. She will continue with her anti-emetic if needed.   We talked about her quality of life and finding activities that she enjoys.  We also talked about support groups.  She has previously tried to attend support groups.  She attends an online support group.  We also discussed her COPD which is the main health concern affecting her quality of life because she is on supplemental oxygen and is not able to perform that many activities without being short of breath.  Her labs show she is slightly dehydrated.  She was encouraged to increase her fluid intake.  The patient was advised to call immediately if she has any concerning symptoms in the interval. The patient voices understanding of current disease status and treatment options and is in agreement with the current care plan. All questions were answered. The patient knows to call the clinic with any problems, questions or concerns. We can certainly see the patient much sooner if necessary  No orders of the defined types were placed in this encounter.   Yamilex Borgwardt L Yancy Hascall, PA-C 06/01/23  ADDENDUM: Hematology/Oncology Attending: I had a face-to-face encounter with the patient today.  I reviewed her record, lab, scan and recommended her care plan.  This is a very pleasant 68 years old white female with a stage IV non-small cell lung cancer, adenocarcinoma diagnosed in December 2019  with multifocal disease in both lungs.  Her tumor  was positive for KRAS G12C mutation and has PD-L1 expression of 1%.  She was treated in the past with systemic chemotherapy with carboplatin, Alimta and Keytruda followed by maintenance single agent Alimta then it was discontinued secondary to renal insufficiency.  The patient started treatment with adagrasib 600 mg p.o. daily initially on December 2023 and then her dose was reduced to 400 mg p.o. twice daily since March 10, 2022 and she has been tolerating her treatment fairly well.  She continues to complain of fatigue and shortness of breath and she is currently on home oxygen.  She had repeat CT scan of the chest, abdomen and pelvis performed recently.  I personally independently reviewed the scan and discussed the result with the patient today. Her scan showed no concerning findings for disease progression. I recommended for her to continue her current treatment with Krazati (Adagrasib) 400 mg p.o. twice daily. The patient will come back for follow-up visit in 2 months for reevaluation and repeat blood work. She was advised to call immediately if she has any other concerning symptoms in the interval. The total time spent in the appointment was 30 minutes. Disclaimer: This note was dictated with voice recognition software. Similar sounding words can inadvertently be transcribed and may be missed upon review. Lajuana Matte, MD

## 2023-06-08 ENCOUNTER — Inpatient Hospital Stay: Attending: Physician Assistant

## 2023-06-08 ENCOUNTER — Inpatient Hospital Stay: Admitting: Physician Assistant

## 2023-06-08 ENCOUNTER — Encounter: Payer: Self-pay | Admitting: Internal Medicine

## 2023-06-08 VITALS — BP 164/99 | HR 79 | Temp 96.4°F | Resp 18 | Wt 179.5 lb

## 2023-06-08 DIAGNOSIS — Z95828 Presence of other vascular implants and grafts: Secondary | ICD-10-CM

## 2023-06-08 DIAGNOSIS — C3491 Malignant neoplasm of unspecified part of right bronchus or lung: Secondary | ICD-10-CM | POA: Diagnosis not present

## 2023-06-08 DIAGNOSIS — R197 Diarrhea, unspecified: Secondary | ICD-10-CM | POA: Diagnosis not present

## 2023-06-08 DIAGNOSIS — Z9981 Dependence on supplemental oxygen: Secondary | ICD-10-CM | POA: Diagnosis not present

## 2023-06-08 DIAGNOSIS — C3492 Malignant neoplasm of unspecified part of left bronchus or lung: Secondary | ICD-10-CM

## 2023-06-08 DIAGNOSIS — E860 Dehydration: Secondary | ICD-10-CM | POA: Insufficient documentation

## 2023-06-08 DIAGNOSIS — J449 Chronic obstructive pulmonary disease, unspecified: Secondary | ICD-10-CM | POA: Insufficient documentation

## 2023-06-08 DIAGNOSIS — Z9221 Personal history of antineoplastic chemotherapy: Secondary | ICD-10-CM | POA: Diagnosis not present

## 2023-06-08 LAB — CBC WITH DIFFERENTIAL (CANCER CENTER ONLY)
Abs Immature Granulocytes: 0.04 10*3/uL (ref 0.00–0.07)
Basophils Absolute: 0.1 10*3/uL (ref 0.0–0.1)
Basophils Relative: 1 %
Eosinophils Absolute: 0.4 10*3/uL (ref 0.0–0.5)
Eosinophils Relative: 4 %
HCT: 37.9 % (ref 36.0–46.0)
Hemoglobin: 12.5 g/dL (ref 12.0–15.0)
Immature Granulocytes: 0 %
Lymphocytes Relative: 13 %
Lymphs Abs: 1.3 10*3/uL (ref 0.7–4.0)
MCH: 30 pg (ref 26.0–34.0)
MCHC: 33 g/dL (ref 30.0–36.0)
MCV: 90.9 fL (ref 80.0–100.0)
Monocytes Absolute: 0.9 10*3/uL (ref 0.1–1.0)
Monocytes Relative: 10 %
Neutro Abs: 6.9 10*3/uL (ref 1.7–7.7)
Neutrophils Relative %: 72 %
Platelet Count: 219 10*3/uL (ref 150–400)
RBC: 4.17 MIL/uL (ref 3.87–5.11)
RDW: 12.9 % (ref 11.5–15.5)
WBC Count: 9.5 10*3/uL (ref 4.0–10.5)
nRBC: 0 % (ref 0.0–0.2)

## 2023-06-08 LAB — CMP (CANCER CENTER ONLY)
ALT: 36 U/L (ref 0–44)
AST: 60 U/L — ABNORMAL HIGH (ref 15–41)
Albumin: 4.6 g/dL (ref 3.5–5.0)
Alkaline Phosphatase: 99 U/L (ref 38–126)
Anion gap: 5 (ref 5–15)
BUN: 23 mg/dL (ref 8–23)
CO2: 33 mmol/L — ABNORMAL HIGH (ref 22–32)
Calcium: 9.7 mg/dL (ref 8.9–10.3)
Chloride: 103 mmol/L (ref 98–111)
Creatinine: 1.6 mg/dL — ABNORMAL HIGH (ref 0.44–1.00)
GFR, Estimated: 35 mL/min — ABNORMAL LOW (ref >60)
Glucose, Bld: 110 mg/dL — ABNORMAL HIGH (ref 70–99)
Potassium: 4 mmol/L (ref 3.5–5.1)
Sodium: 141 mmol/L (ref 135–145)
Total Bilirubin: 1 mg/dL (ref 0.0–1.2)
Total Protein: 7.3 g/dL (ref 6.5–8.1)

## 2023-06-08 MED ORDER — SODIUM CHLORIDE 0.9% FLUSH
10.0000 mL | Freq: Once | INTRAVENOUS | Status: AC
  Administered 2023-06-08: 10 mL

## 2023-06-08 MED ORDER — HEPARIN SOD (PORK) LOCK FLUSH 100 UNIT/ML IV SOLN
500.0000 [IU] | Freq: Once | INTRAVENOUS | Status: AC
  Administered 2023-06-08: 500 [IU]

## 2023-06-16 ENCOUNTER — Other Ambulatory Visit (HOSPITAL_COMMUNITY): Payer: Self-pay

## 2023-06-22 ENCOUNTER — Other Ambulatory Visit (HOSPITAL_COMMUNITY): Payer: Self-pay

## 2023-06-23 ENCOUNTER — Other Ambulatory Visit: Payer: Self-pay

## 2023-06-23 ENCOUNTER — Other Ambulatory Visit (HOSPITAL_COMMUNITY): Payer: Self-pay

## 2023-06-23 NOTE — Progress Notes (Signed)
 Specialty Pharmacy Refill Coordination Note  [name] is a 68 y.o. female contacted today regarding refills of specialty medication(s) Krazati .  Patient requested (Patient-Rptd) Delivery   Delivery date: (Patient-Rptd) 06/26/23   [address]ified [address] ([place]Rptd[address] [place] [place]  [address]   Medication will be filled on 06/24/23. New delivery date is 06/25/23. Patient has been notified.

## 2023-06-24 ENCOUNTER — Other Ambulatory Visit: Payer: Self-pay

## 2023-07-14 ENCOUNTER — Other Ambulatory Visit: Payer: Self-pay | Admitting: Pharmacy Technician

## 2023-07-14 ENCOUNTER — Other Ambulatory Visit: Payer: Self-pay

## 2023-07-14 NOTE — Progress Notes (Signed)
 Specialty Pharmacy Refill Coordination Note  [name] is a 68 y.o. female contacted today regarding refills of specialty medication(s) Adagrasib  (Krazati )   Patient requested (Patient-Rptd) Delivery   Delivery date: 07/22/23   [address] [place]  [address]   Medication will be filled on 07/21/23.

## 2023-07-21 ENCOUNTER — Ambulatory Visit: Payer: Managed Care, Other (non HMO) | Admitting: Physician Assistant

## 2023-07-21 ENCOUNTER — Other Ambulatory Visit: Payer: Managed Care, Other (non HMO)

## 2023-07-21 ENCOUNTER — Other Ambulatory Visit: Payer: Self-pay

## 2023-08-05 NOTE — Progress Notes (Signed)
 [facility] OFFICE PROGRESS NOTE  Verlene Glimpse, NP [address] [place] [address]  DIAGNOSIS: Stage IV (T1a, N0, M1 a) non-small cell lung cancer, adenocarcinoma presented with multifocal disease in both lungs including 3 nodules in the left lung as well as 2 nodules in the right lung diagnosed in December 2019.     Molecular studies showed: KRAS G12C BCOR N1425S-subclonal RAD21 amplification   PDL 1 expression 1%  PRIOR THERAPY: 1) Systemic chemotherapy with carboplatin  for AUC of 5, Alimta  500 mg/M2 and Keytruda  200 mg IV every 3 weeks.  First dose April 12, 2018. Status post 4 cycles. starting from cycle #3, Keytruda  was dropped and patient received treatment with Carboplatin  for an AUC of 5 and Alimta  500 mg/m2 IV every 3 weeks.  2) Systemic chemotherapy with Alimta  500 mg/m2 IV every 3 weeks. Status post 3 cycles of single agent Alimta . Most recent dose given on 08/23/2018.  The treatment is currently on hold secondary to renal insufficiency  CURRENT THERAPY: Krazati  600 mg BID, first dose on 02/20/22. Dose reduced to 400 mg BID starting on 03/10/22. However, the patient states this was on hold since she was in the hospital on 03/15/22 and she had not resumed it until 05/07/22.   INTERVAL HISTORY: [name] 68 y.o. female returns to the clinic today for a follow-up visit. The patient was last seen in clinic by myself on 06/08/23. The patient is currently on a reduced dose of treatment with Krazati .  She tolerates this well without any adverse side effects.    In general the patient does have a lot of unusual symptoms and side effects that have been going on predating Krazati  and poor overall health due to her COPD.  For example she has been having nausea and vomiting and intermittent diarrhea ongoing even while off treatment on observation for several years.  The diarrhea started after having her gallbladder removed.  She has not noticed any changes in the  frequency or severity of her diarrhea since being on Krazati . If anything, she has having less diarrhea at this time. She continues to have nausea/vomiting daily. It sounds like she is taking some over the counter medication for nausea. I will refill her zofran  today. A few years ago, she had adverse side effects from compazine .   She also struggles with fatigue and decreased activity tolerance with minimal exertion. She does not have good quality of life due to her activity restrictions due to her breathing.   She does try to use foot petals and weights. She has significant COPD and wears supplemental oxygen at baseline. She does not engage in a lot of social activities and her children do not visit her. She does not do a lot of activities that bring her joy. She denies depression when asked. He is on Wellbutrin .  She has intermittent cough which sometimes produces phlegm. She produces nasal discharge too. She does not take any antihistamines.  She denies any fevers.  She struggles with intermittent chills at baseline. She reports she is not as hungry but her husband tries to make sure she eats. She denies any rashes. She is here today for evaluation and repeat blood work.    MEDICAL HISTORY: Past Medical History:  Diagnosis Date   Anemia    "long time ago" (12/06/2012)   Anxiety    Arthritis    "right knee real bad; in my hands bad" (12/06/2012)   Asthma    Celiac  disease    Colon polyps    COPD (chronic obstructive pulmonary disease) (HCC)    Coronary artery disease    a. s/p Xience DES to [facility] 04/2008;  b. LHC 05/2008: Proximal RCA 25%, mid RCA stent patent.  c. Anormal nuc 2014 -> s/p LHC with severe mRCA stenosis s/p DES. d. Cath 04/2013 s/p DES to LAD.  e. LHC 08/2015 was stable.   Depression    "years ago" (12/06/2012)   Fibromyalgia    Gallstones    GERD (gastroesophageal reflux disease)    H/O hiatal hernia    Hepatitis    "not A, B, or C" (12/06/2012)   Hyperlipidemia    intol to  statins and other chol agents due to elevated LFTs   Hypertension    Hypothyroidism    IBS (irritable bowel syndrome)    Interstitial cystitis    Lung cancer (HCC) dx'd 01/2018   Lung nodules    Bilateral   Migraines    "when I was younger" (12/06/2012)   Pneumonia    PONV (postoperative nausea and vomiting)    "Very bad"   Premature atrial contractions    PVC's (premature ventricular contractions)    Sinus headache    SVT (supraventricular tachycardia) (HCC)     ALLERGIES:  is allergic to ciprofloxacin, levofloxacin, statins, tricor  [fenofibrate ], compazine  [prochlorperazine ], latex, codeine, and metoprolol .  MEDICATIONS:  Current Outpatient Medications  Medication Sig Dispense Refill   adagrasib  (KRAZATI ) 200 MG tablet Take 2 tablets (400 mg total) by mouth 2 (two) times daily. 180 tablet 3   albuterol  (PROVENTIL ) (5 MG/ML) 0.5% nebulizer solution Take 2.5 mg by nebulization every 6 (six) hours as needed for wheezing.     albuterol  (VENTOLIN  HFA) 108 (90 Base) MCG/ACT inhaler Inhale 1 puff into the lungs every 6 (six) hours as needed (wheezing/shortness of breath.).      aspirin  81 MG chewable tablet Chew 1 tablet (81 mg total) by mouth daily.     Budeson-Glycopyrrol-Formoterol  (BREZTRI  AEROSPHERE) 160-9-4.8 MCG/ACT AERO Inhale 2 puffs into the lungs in the morning and at bedtime. 10.7 g 5   buPROPion  (WELLBUTRIN  XL) 150 MG 24 hr tablet Take 1-2 tablets by mouth 2 (two) times daily. Patient takes 2 tablets (300mg ) in the morning and 1 tablet (150mg ) in the evening     doxycycline  (VIBRA -TABS) 100 MG tablet Take 1 tablet (100 mg total) by mouth 2 (two) times daily. 20 tablet 0   ferrous sulfate  (FEROSUL) 325 (65 FE) MG tablet TAKE 1 TABLET(325 MG) BY MOUTH DAILY WITH BREAKFAST 30 tablet 2   gabapentin  (NEURONTIN ) 100 MG capsule TAKE 1 CAPSULE(100 MG) BY MOUTH THREE TIMES DAILY 90 capsule 2   ipratropium-albuterol  (DUONEB) 0.5-2.5 (3) MG/3ML SOLN Inhale 3 mLs into the lungs every 6  (six) hours as needed (Asthma). 360 mL 3   loperamide  (IMODIUM  A-D) 2 MG tablet Take 1 tablet (2 mg total) by mouth 4 (four) times daily as needed for diarrhea or loose stools. 20 tablet 0   LORazepam  (ATIVAN ) 2 MG tablet Take 2 mg by mouth every evening.     metoprolol  tartrate (LOPRESSOR ) 25 MG tablet Take 0.5 tablets (12.5 mg total) by mouth 2 (two) times daily. 90 tablet 3   nitroGLYCERIN  (NITROSTAT ) 0.4 MG SL tablet Place 1 tablet (0.4 mg total) under the tongue every 5 (five) minutes as needed for chest pain. 25 tablet 12   ondansetron  (ZOFRAN -ODT) 4 MG disintegrating tablet Take 1 tablet (4 mg total) by mouth every  8 (eight) hours as needed for nausea or vomiting. 30 tablet 2   No current facility-administered medications for this visit.    SURGICAL HISTORY:  Past Surgical History:  Procedure Laterality Date   CARDIAC CATHETERIZATION  04/2008; 05/2008   CARDIAC CATHETERIZATION N/A 08/07/2015   Procedure: Left Heart Cath and Coronary Angiography;  Surgeon: Arnoldo Lapping, MD;  Location: [facility] INVASIVE CV LAB;  Service: Cardiovascular;  Laterality: N/A;   CHOLECYSTECTOMY     CORONARY ANGIOPLASTY WITH STENT PLACEMENT  04/2008 12/06/2012   "1 + 1" (12/06/2012)   CORONARY STENT PLACEMENT  05/08/2013   DES TO LAD      DR MCALHANY   IR IMAGING GUIDED PORT INSERTION  04/29/2018   LEFT HEART CATHETERIZATION WITH CORONARY ANGIOGRAM N/A 12/06/2012   Procedure: LEFT HEART CATHETERIZATION WITH CORONARY ANGIOGRAM;  Surgeon: Odie Benne, MD;  Location: [facility] CATH LAB;  Service: Cardiovascular;  Laterality: N/A;   LEFT HEART CATHETERIZATION WITH CORONARY ANGIOGRAM N/A 05/08/2013   Procedure: LEFT HEART CATHETERIZATION WITH CORONARY ANGIOGRAM;  Surgeon: Odie Benne, MD;  Location: [facility] CATH LAB;  Service: Cardiovascular;  Laterality: N/A;   LEFT HEART CATHETERIZATION WITH CORONARY ANGIOGRAM N/A 01/04/2014   Procedure: LEFT HEART CATHETERIZATION WITH CORONARY ANGIOGRAM;  Surgeon: Odie Benne, MD;  Location: [facility] CATH LAB;  Service: Cardiovascular;  Laterality: N/A;   PERCUTANEOUS CORONARY STENT INTERVENTION (PCI-S)  12/06/2012   Procedure: PERCUTANEOUS CORONARY STENT INTERVENTION (PCI-S);  Surgeon: Odie Benne, MD;  Location: [facility] CATH LAB;  Service: Cardiovascular;;   PERCUTANEOUS CORONARY STENT INTERVENTION (PCI-S)  05/08/2013   Procedure: PERCUTANEOUS CORONARY STENT INTERVENTION (PCI-S);  Surgeon: Odie Benne, MD;  Location: [facility] CATH LAB;  Service: Cardiovascular;;  mid LAD    TUBAL LIGATION     VAGINAL HYSTERECTOMY     VIDEO BRONCHOSCOPY WITH ENDOBRONCHIAL NAVIGATION N/A 02/17/2018   Procedure: VIDEO BRONCHOSCOPY WITH ENDOBRONCHIAL NAVIGATION;  Surgeon: Zelphia Higashi, MD;  Location: [facility] OR;  Service: Thoracic;  Laterality: N/A;    REVIEW OF SYSTEMS:   Constitutional: Positive for stable fatigue, decreased appetite, and weight loss. Negative for chills and fever.  HENT: Negative for mouth sores, nosebleeds, sore throat and trouble swallowing.   Eyes: Negative for eye problems and icterus.  Respiratory: Positive for dyspnea on exertion and intermittent cough. Negative for hemoptysis, and wheezing.   Cardiovascular: Intermittent left shoulder/rib pain. Negative for leg swelling.  Gastrointestinal: Positive for intermittent nausea/vomiting. Negative for abdominal pain.  Genitourinary:  Negative for bladder incontinence and hematuria.   Musculoskeletal: Positive for multifocal joint pain (osteoarthritis and fibromyalgia) especially in left shoulder. Negative for gait problem, neck pain and neck stiffness.  Skin: Negative for itching and rash.  Neurological: Negative for extremity weakness and seizures.  Hematological: Negative for adenopathy. Does not bruise/bleed easily.  Psychiatric/Behavioral: Negative for confusion, depression and sleep disturbance. The patient is not nervous/anxious.       PHYSICAL EXAMINATION:  There were no vitals taken for  this visit.  ECOG PERFORMANCE STATUS: 2  Physical Exam  Constitutional: Oriented to person, place, and time and chronically ill-appearing female and in no distress.  HENT:  Head: Normocephalic and atraumatic.  Mouth/Throat: Oropharynx is clear and moist. No oropharyngeal exudate.  Eyes: Conjunctivae are normal. Right eye exhibits no discharge. Left eye exhibits no discharge. No scleral icterus.  Neck: Normal range of motion. Neck supple Cardiovascular: Normal rate, regular rhythm, normal heart sounds and intact distal pulses.   Pulmonary/Chest: Effort normal. Quiet breath sounds bilaterally.  On supplemental oxygen.  No respiratory distress. No wheezes. No rales.  Abdominal: Soft. Bowel sounds are normal. Exhibits no distension and no mass. There is no tenderness.  Musculoskeletal: Normal range of motion. Exhibits no edema.  Lymphadenopathy:    No cervical adenopathy.  Neurological: Alert and oriented to person, place, and time. Exhibits muscle wasting.  Skin: Skin is warm and dry. No rash noted. Not diaphoretic. No erythema. No pallor.  Psychiatric: Mood, memory and judgment normal.  Vitals reviewed.    LABORATORY DATA: Lab Results  Component Value Date   WBC 9.5 06/08/2023   HGB 12.5 06/08/2023   HCT 37.9 06/08/2023   MCV 90.9 06/08/2023   PLT 219 06/08/2023      Chemistry      Component Value Date/Time   NA 141 06/08/2023 1427   NA 143 01/10/2018 1629   K 4.0 06/08/2023 1427   CL 103 06/08/2023 1427   CO2 33 (H) 06/08/2023 1427   BUN 23 06/08/2023 1427   BUN 14 01/10/2018 1629   CREATININE 1.60 (H) 06/08/2023 1427   CREATININE 0.68 08/05/2015 1456      Component Value Date/Time   CALCIUM  9.7 06/08/2023 1427   ALKPHOS 99 06/08/2023 1427   AST 60 (H) 06/08/2023 1427   ALT 36 06/08/2023 1427   BILITOT 1.0 06/08/2023 1427       RADIOGRAPHIC STUDIES:  No results found.   ASSESSMENT/PLAN:  This is a very pleasant 68 year old Caucasian female diagnosed with  stage IV non-small cell lung cancer, adenocarcinoma. She is negative for any actionable mutations and her PD-L1 expression is 1%. She was diagnosed in December 2019. She presented with bilateral pulmonary nodules.    She initially started treatment with carboplatin , Alimta , and Keytruda . She was status post 4 cycles but Keytruda  was discontinued after cycle #2 due to his significant skin rash. She then was treated with maintenance single agent Alimta  for 3 cycles. This has been on hold since June 2020 due to worsening renal insufficiency.   She has been on observation since that time and feeling fairly well except she does have significant COPD for which she is on supplemental oxygen. She follows closely with pulmonology.   She had a PET scan which showed evidence of disease progression with multiple bilateral solid and subsolid nodules.   Due to the patient's history of intolerance to chemotherapy she was started on second line targeted treatment with Krazati  600 mg twice daily.  Her first dose was on 02/20/22.  Dose was reduced to 400 mg twice daily around 03/03/2022 due to ongoing symptoms of nausea, vomiting, and diarrhea. She resumed this on 05/07/22.  Labs were reviewed. Recommend she continue on the same treatment at the same dose  I will arrange for a restaging CT scan of the chest abdomen, and pelvis.   We will see her back for labs and follow up in 4 weeks for evaluation and repeat blood work and to review her scan results.  I have filled her zofran  for 8 mg as opposed to 4 mg.   Discussed anti-histamine for nasal congestion.   The patient was advised to call immediately if she has any concerning symptoms in the interval. The patient voices understanding of current disease status and treatment options and is in agreement with the current care plan. All questions were answered. The patient knows to call the clinic with any problems, questions or concerns. We can certainly see the patient  much sooner if necessary  No orders of the defined types were placed in this encounter.    The total time spent in the appointment was 20-29 minutes  Xander Jutras L Ether Goebel, PA-C 08/05/23

## 2023-08-09 ENCOUNTER — Inpatient Hospital Stay: Attending: Physician Assistant

## 2023-08-09 ENCOUNTER — Inpatient Hospital Stay: Admitting: Physician Assistant

## 2023-08-09 DIAGNOSIS — Z9981 Dependence on supplemental oxygen: Secondary | ICD-10-CM | POA: Insufficient documentation

## 2023-08-09 DIAGNOSIS — J449 Chronic obstructive pulmonary disease, unspecified: Secondary | ICD-10-CM | POA: Diagnosis not present

## 2023-08-09 DIAGNOSIS — R197 Diarrhea, unspecified: Secondary | ICD-10-CM | POA: Insufficient documentation

## 2023-08-09 DIAGNOSIS — C3491 Malignant neoplasm of unspecified part of right bronchus or lung: Secondary | ICD-10-CM | POA: Insufficient documentation

## 2023-08-09 DIAGNOSIS — C3492 Malignant neoplasm of unspecified part of left bronchus or lung: Secondary | ICD-10-CM | POA: Diagnosis not present

## 2023-08-09 DIAGNOSIS — N289 Disorder of kidney and ureter, unspecified: Secondary | ICD-10-CM | POA: Insufficient documentation

## 2023-08-09 DIAGNOSIS — R112 Nausea with vomiting, unspecified: Secondary | ICD-10-CM | POA: Diagnosis not present

## 2023-08-09 LAB — CBC WITH DIFFERENTIAL (CANCER CENTER ONLY)
Abs Immature Granulocytes: 0.02 10*3/uL (ref 0.00–0.07)
Basophils Absolute: 0.1 10*3/uL (ref 0.0–0.1)
Basophils Relative: 1 %
Eosinophils Absolute: 0.5 10*3/uL (ref 0.0–0.5)
Eosinophils Relative: 5 %
HCT: 39.1 % (ref 36.0–46.0)
Hemoglobin: 12.5 g/dL (ref 12.0–15.0)
Immature Granulocytes: 0 %
Lymphocytes Relative: 18 %
Lymphs Abs: 1.6 10*3/uL (ref 0.7–4.0)
MCH: 29.7 pg (ref 26.0–34.0)
MCHC: 32 g/dL (ref 30.0–36.0)
MCV: 92.9 fL (ref 80.0–100.0)
Monocytes Absolute: 0.7 10*3/uL (ref 0.1–1.0)
Monocytes Relative: 8 %
Neutro Abs: 5.9 10*3/uL (ref 1.7–7.7)
Neutrophils Relative %: 68 %
Platelet Count: 240 10*3/uL (ref 150–400)
RBC: 4.21 MIL/uL (ref 3.87–5.11)
RDW: 12.8 % (ref 11.5–15.5)
WBC Count: 8.7 10*3/uL (ref 4.0–10.5)
nRBC: 0 % (ref 0.0–0.2)

## 2023-08-09 LAB — CMP (CANCER CENTER ONLY)
ALT: 22 U/L (ref 0–44)
AST: 15 U/L (ref 15–41)
Albumin: 4.5 g/dL (ref 3.5–5.0)
Alkaline Phosphatase: 91 U/L (ref 38–126)
Anion gap: 6 (ref 5–15)
BUN: 23 mg/dL (ref 8–23)
CO2: 31 mmol/L (ref 22–32)
Calcium: 9.4 mg/dL (ref 8.9–10.3)
Chloride: 105 mmol/L (ref 98–111)
Creatinine: 1.51 mg/dL — ABNORMAL HIGH (ref 0.44–1.00)
GFR, Estimated: 37 mL/min — ABNORMAL LOW (ref >60)
Glucose, Bld: 125 mg/dL — ABNORMAL HIGH (ref 70–99)
Potassium: 5 mmol/L (ref 3.5–5.1)
Sodium: 142 mmol/L (ref 135–145)
Total Bilirubin: 0.4 mg/dL (ref 0.0–1.2)
Total Protein: 6.9 g/dL (ref 6.5–8.1)

## 2023-08-09 MED ORDER — ONDANSETRON 8 MG PO TBDP
8.0000 mg | ORAL_TABLET | Freq: Three times a day (TID) | ORAL | 2 refills | Status: AC | PRN
Start: 2023-08-09 — End: ?

## 2023-08-13 ENCOUNTER — Other Ambulatory Visit: Payer: Self-pay

## 2023-08-16 ENCOUNTER — Other Ambulatory Visit: Payer: Self-pay

## 2023-08-16 NOTE — Progress Notes (Signed)
 Specialty Pharmacy Refill Coordination Note  [name] is a 68 y.o. female contacted today regarding refills of specialty medication(s) Adagrasib  (Krazati )   Patient requested Delivery   Delivery date: 08/18/23   [address]  [address]   Medication will be filled on 08/17/23.

## 2023-08-25 ENCOUNTER — Ambulatory Visit (HOSPITAL_COMMUNITY)

## 2023-09-01 ENCOUNTER — Other Ambulatory Visit: Payer: Self-pay

## 2023-09-02 ENCOUNTER — Encounter: Payer: Self-pay | Admitting: Internal Medicine

## 2023-09-06 ENCOUNTER — Other Ambulatory Visit: Payer: Self-pay

## 2023-09-07 ENCOUNTER — Telehealth: Payer: Self-pay | Admitting: Physician Assistant

## 2023-09-07 ENCOUNTER — Ambulatory Visit (HOSPITAL_COMMUNITY)

## 2023-09-07 NOTE — Telephone Encounter (Signed)
 Left the patient a voicemail with the rescheduled appointment details per the follow up being to close to the CT scan.

## 2023-09-08 ENCOUNTER — Other Ambulatory Visit: Payer: Self-pay

## 2023-09-08 ENCOUNTER — Other Ambulatory Visit

## 2023-09-08 ENCOUNTER — Ambulatory Visit: Admitting: Internal Medicine

## 2023-09-10 ENCOUNTER — Other Ambulatory Visit: Payer: Self-pay

## 2023-09-10 ENCOUNTER — Other Ambulatory Visit: Payer: Self-pay | Admitting: Pharmacy Technician

## 2023-09-10 NOTE — Progress Notes (Signed)
 Specialty Pharmacy Refill Coordination Note  [name] is a 68 y.o. female contacted today regarding refills of specialty medication(s) Adagrasib  (Krazati )   Patient requested Delivery   Delivery date: 09/13/23   Verified address: [address]   [place] [address]   Medication will be filled on 09/10/23.

## 2023-09-15 ENCOUNTER — Ambulatory Visit (HOSPITAL_COMMUNITY)
Admission: RE | Admit: 2023-09-15 | Discharge: 2023-09-15 | Disposition: A | Discharge status: Home / Self Care | Source: Ambulatory Visit | Attending: Physician Assistant | Admitting: Physician Assistant

## 2023-09-15 DIAGNOSIS — C3492 Malignant neoplasm of unspecified part of left bronchus or lung: Secondary | ICD-10-CM | POA: Insufficient documentation

## 2023-09-15 MED ORDER — IOHEXOL 300 MG/ML  SOLN
100.0000 mL | Freq: Once | INTRAMUSCULAR | Status: AC | PRN
Start: 2023-09-15 — End: 2023-09-15
  Administered 2023-09-15: 100 mL via INTRAVENOUS

## 2023-09-15 MED ORDER — HEPARIN SOD (PORK) LOCK FLUSH 100 UNIT/ML IV SOLN
500.0000 [IU] | Freq: Once | INTRAVENOUS | Status: AC
  Administered 2023-09-15: 500 [IU] via INTRAVENOUS

## 2023-09-22 ENCOUNTER — Ambulatory Visit: Admitting: Primary Care

## 2023-09-22 ENCOUNTER — Ambulatory Visit: Payer: Self-pay

## 2023-09-22 NOTE — Telephone Encounter (Signed)
 C. L. called in to reschedule her appointment as she could not make the appointment today due to transportation issues. Rescheduled appointment per patient's preference.   Reinforced the severity of symptoms warranting prompt evaluation and reinforced previously recommended disposition. Provided resources to follow the disposition. The patient agreed and verbalized understanding.   Reviewed red flag symptoms including: - Difficulty breathing - High or persistent fever - Mental status changes - Inability to hydrate - Worsening or unrelieved pain Patient/caregiver instructed to seek immediate care if any of the above develop.  Provided return precautions and escalation instructions appropriate to the concern.

## 2023-09-22 NOTE — Telephone Encounter (Signed)
 FYI Only or Action Required?: FYI only for provider.  Patient is followed in Pulmonology for LUNG CA, COPD, asthma, last seen on 04/17/2021 by Shelah Lamar RAMAN, MD.  Called Nurse Triage reporting Cough.  Symptoms began several weeks ago.  Interventions attempted: Rescue inhaler, Maintenance inhaler, Home oxygen use, and Increased fluids/rest.  Symptoms are: unchanged.  Triage Disposition: See HCP Within 4 Hours (Or PCP Triage)  Patient/caregiver understands and will follow disposition?: Yes  Copied from CRM #[id number]. Topic: Clinical - Red Word Triage >> Sep 22, 2023 10:17 AM Corean SAUNDERS wrote: Red Word that prompted transfer to Nurse Triage: Worsening cough Reason for Disposition  [1] MILD difficulty breathing (e.g., minimal/no SOB at rest, SOB with walking, pulse < 100) AND [2] still present when not coughing  (Exception: No change from usual, chronic shortness of breath.)  Answer Assessment - Initial Assessment Questions 1. ONSET: When did the cough begin?      Chronic cough 2. SEVERITY: How bad is the cough today?      4 out of 10 but worsens throughout the day. 3. SPUTUM: Describe the color of your sputum (e.g., none, dry cough; clear, white, yellow, green)     Dry at times, productive at times-yellow 4. HEMOPTYSIS: Are you coughing up any blood? If so ask: How much? (e.g., flecks, streaks, tablespoons, etc.)     no 5. DIFFICULTY BREATHING: Are you having difficulty breathing? If Yes, ask: How bad is it? (e.g., mild, moderate, severe)      Patient reports no difficulty breathing but patient sounds short of breath while speaking to Nurse Triage.  6. FEVER: Do you have a fever? If Yes, ask: What is your temperature, how was it measured, and when did it start?     no 7. CARDIAC HISTORY: Do you have any history of heart disease? (e.g., heart attack, congestive heart failure)      Heart stents 8. LUNG HISTORY: Do you have any history of lung disease?  (e.g.,  pulmonary embolus, asthma, emphysema)     Lung CA, COPD, asthma 9. PE RISK FACTORS: Do you have a history of blood clots? (or: recent major surgery, recent prolonged travel, bedridden)     no 10. OTHER SYMPTOMS: Do you have any other symptoms? (e.g., runny nose, wheezing, chest pain)       Runny nose, wheezing 12. TRAVEL: Have you traveled out of the country in the last month? (e.g., travel history, exposures)       No  Patient with hx of Lung CA, COPD and asthma calling with worsening cough. Patient endorses no shortness of breath but she is sitting while speaking to Nurse Triage. Patient reports she is feeling more winded while speaking on the phone. Patient states she was on Breztri  inhaler at one time which was helping. Patient reports she was switched to Symbicort  at one point last year when she was in the hospital and feels that this doesn't help her like the Breztri . Patient recommended to be seen today. Acute appointment made this afternoon with an APP in the [place] office. Patient verbalized understanding and all questions answered.  Protocols used: Cough - Chronic-A-AH

## 2023-09-24 NOTE — Progress Notes (Signed)
 [name]  [name], NP [address] [place] [address]  DIAGNOSIS: Stage IV (T1a, N0, M1 a) non-small cell lung cancer, adenocarcinoma presented with multifocal disease in both lungs including 3 nodules in the left lung as well as 2 nodules in the right lung diagnosed in December 2019.     Molecular studies showed: KRAS G12C BCOR N1425S-subclonal RAD21 amplification   PDL 1 expression 1%  PRIOR THERAPY: 1) Systemic chemotherapy with carboplatin  for AUC of 5, Alimta  500 mg/M2 and Keytruda  200 mg IV every 3 weeks.  First dose April 12, 2018. Status post 4 cycles. starting from cycle #3, Keytruda  was dropped and patient received treatment with Carboplatin  for an AUC of 5 and Alimta  500 mg/m2 IV every 3 weeks.  2) Systemic chemotherapy with Alimta  500 mg/m2 IV every 3 weeks. Status post 3 cycles of single agent Alimta . Most recent dose given on 08/23/2018.  The treatment is currently on hold secondary to renal insufficiency  CURRENT THERAPY:  Krazati  600 mg BID, first dose on 02/20/22. Dose reduced to 400 mg BID starting on 03/10/22. However, the patient states this was on hold since she was in the hospital on 03/15/22 and she had not resumed it until 05/07/22.   INTERVAL HISTORY: [name] 68 y.o. female returns to the clinic today for a follow-up visit. The patient was last seen in clinic by 08/09/23.   The patient is currently on a reduced dose of treatment with Krazati . She tolerates this well without any adverse side effects.   In general the patient does have a lot of unusual symptoms and side effects that have been going on predating Krazati  and poor overall health due to her COPD. For example she had been having nausea and vomiting and intermittent diarrhea ongoing even while off treatment on observation for several years.  The diarrhea started after having her gallbladder removed.  She has not noticed any changes in the frequency  or severity of her diarrhea since being on Krazati . If anything, she has having less diarrhea at this time. She continues to have nausea but this also has improved as she no longer is eating large meals however, this has caused her to lose about 10 lbs over the last 5 months or so. She uses sublingual zofran  which is effective for her.   She experiences generalized itching all over her body. Her skin is discolored and itchy, and she has not changed any medications or applied anything new to her skin. She tries to drink water regularly but finds it challenging to consume large amounts at once due to issues with the nausea/vomiting. She takes allergy  medication every morning, which helps with some of her symptoms.  She experiences chest pain that radiates to her shoulder, which has been severe at times. Although she has not experienced this pain in the last couple of days, it has been a recurring issue. She has a history of coronary artery disease with stents placed, including one that has been re-stented. She has a cardiologist that she plans to call to make an appointment.   Due to her severe COPD, she is not as active. If she moves around, she has tachycardia even with minimal exertion. She is on supplemental oxygen at baseline. She sees Dr. Darlean from pulmonary medicine. She has an upcoming appointment. She does not like how the supplemental oxygen causes dryness in her nostril. She tried to use a humidifier but it caused  water to get in her tubing so she stopped.   She mentions a history of fatty liver and elevated liver enzymes, for which she used to see a gastroenterologist. She has never consumed alcohol, and her liver issues were attributed to fatty liver disease. She recalls being told that she might develop cirrhosis in the future.  She recently had a restaging CT scan performed. She is here today for evaluation and repeat blood work.    MEDICAL HISTORY: Past Medical History:  Diagnosis Date    Anemia    long time ago (12/06/2012)   Anxiety    Arthritis    right knee real bad; in my hands bad (12/06/2012)   Asthma    Celiac disease    Colon polyps    COPD (chronic obstructive pulmonary disease) (HCC)    Coronary artery disease    a. s/p Xience DES to [facility] 04/2008;  b. LHC 05/2008: Proximal RCA 25%, mid RCA stent patent.  c. Anormal nuc 2014 -> s/p LHC with severe mRCA stenosis s/p DES. d. Cath 04/2013 s/p DES to LAD.  e. LHC 08/2015 was stable.   Depression    years ago (12/06/2012)   Fibromyalgia    Gallstones    GERD (gastroesophageal reflux disease)    H/O hiatal hernia    Hepatitis    not A, B, or C (12/06/2012)   Hyperlipidemia    intol to statins and other chol agents due to elevated LFTs   Hypertension    Hypothyroidism    IBS (irritable bowel syndrome)    Interstitial cystitis    Lung cancer (HCC) dx'd 01/2018   Lung nodules    Bilateral   Migraines    when I was younger (12/06/2012)   Pneumonia    PONV (postoperative nausea and vomiting)    Very bad   Premature atrial contractions    PVC's (premature ventricular contractions)    Sinus headache    SVT (supraventricular tachycardia) (HCC)     ALLERGIES:  is allergic to ciprofloxacin, levofloxacin, statins, tricor  [fenofibrate ], compazine  [prochlorperazine ], latex, codeine, and metoprolol .  MEDICATIONS:  Current Outpatient Medications  Medication Sig Dispense Refill   adagrasib  (KRAZATI ) 200 MG tablet Take 2 tablets (400 mg total) by mouth 2 (two) times daily. 180 tablet 3   albuterol  (PROVENTIL ) (5 MG/ML) 0.5% nebulizer solution Take 2.5 mg by nebulization every 6 (six) hours as needed for wheezing.     albuterol  (VENTOLIN  HFA) 108 (90 Base) MCG/ACT inhaler Inhale 1 puff into the lungs every 6 (six) hours as needed (wheezing/shortness of breath.).      aspirin  81 MG chewable tablet Chew 1 tablet (81 mg total) by mouth daily.     Budeson-Glycopyrrol-Formoterol  (BREZTRI  AEROSPHERE) 160-9-4.8  MCG/ACT AERO Inhale 2 puffs into the lungs in the morning and at bedtime. 10.7 g 5   buPROPion  (WELLBUTRIN  XL) 150 MG 24 hr tablet Take 1-2 tablets by mouth 2 (two) times daily. Patient takes 2 tablets (300mg ) in the morning and 1 tablet (150mg ) in the evening     doxycycline  (VIBRA -TABS) 100 MG tablet Take 1 tablet (100 mg total) by mouth 2 (two) times daily. 20 tablet 0   ferrous sulfate  (FEROSUL) 325 (65 FE) MG tablet TAKE 1 TABLET(325 MG) BY MOUTH DAILY WITH BREAKFAST 30 tablet 2   gabapentin  (NEURONTIN ) 100 MG capsule TAKE 1 CAPSULE(100 MG) BY MOUTH THREE TIMES DAILY 90 capsule 2   ipratropium-albuterol  (DUONEB) 0.5-2.5 (3) MG/3ML SOLN Inhale 3 mLs into the lungs every 6 (  six) hours as needed (Asthma). 360 mL 3   loperamide  (IMODIUM  A-D) 2 MG tablet Take 1 tablet (2 mg total) by mouth 4 (four) times daily as needed for diarrhea or loose stools. 20 tablet 0   LORazepam  (ATIVAN ) 2 MG tablet Take 2 mg by mouth every evening.     metoprolol  tartrate (LOPRESSOR ) 25 MG tablet Take 0.5 tablets (12.5 mg total) by mouth 2 (two) times daily. 90 tablet 3   nitroGLYCERIN  (NITROSTAT ) 0.4 MG SL tablet Place 1 tablet (0.4 mg total) under the tongue every 5 (five) minutes as needed for chest pain. 25 tablet 12   ondansetron  (ZOFRAN -ODT) 8 MG disintegrating tablet Take 1 tablet (8 mg total) by mouth every 8 (eight) hours as needed for nausea or vomiting. 30 tablet 2   No current facility-administered medications for this visit.    SURGICAL HISTORY:  Past Surgical History:  Procedure Laterality Date   CARDIAC CATHETERIZATION  04/2008; 05/2008   CARDIAC CATHETERIZATION N/A 08/07/2015   Procedure: Left Heart Cath and Coronary Angiography;  Surgeon: Ozell Fell, MD;  Location: [facility] INVASIVE CV LAB;  Service: Cardiovascular;  Laterality: N/A;   CHOLECYSTECTOMY     CORONARY ANGIOPLASTY WITH STENT PLACEMENT  04/2008 12/06/2012   1 + 1 (12/06/2012)   CORONARY STENT PLACEMENT  05/08/2013   DES TO LAD      DR  MCALHANY   IR IMAGING GUIDED PORT INSERTION  04/29/2018   LEFT HEART CATHETERIZATION WITH CORONARY ANGIOGRAM N/A 12/06/2012   Procedure: LEFT HEART CATHETERIZATION WITH CORONARY ANGIOGRAM;  Surgeon: Lonni JONETTA Cash, MD;  Location: [facility] CATH LAB;  Service: Cardiovascular;  Laterality: N/A;   LEFT HEART CATHETERIZATION WITH CORONARY ANGIOGRAM N/A 05/08/2013   Procedure: LEFT HEART CATHETERIZATION WITH CORONARY ANGIOGRAM;  Surgeon: Lonni JONETTA Cash, MD;  Location: [facility] CATH LAB;  Service: Cardiovascular;  Laterality: N/A;   LEFT HEART CATHETERIZATION WITH CORONARY ANGIOGRAM N/A 01/04/2014   Procedure: LEFT HEART CATHETERIZATION WITH CORONARY ANGIOGRAM;  Surgeon: Lonni JONETTA Cash, MD;  Location: [facility] CATH LAB;  Service: Cardiovascular;  Laterality: N/A;   PERCUTANEOUS CORONARY STENT INTERVENTION (PCI-S)  12/06/2012   Procedure: PERCUTANEOUS CORONARY STENT INTERVENTION (PCI-S);  Surgeon: Lonni JONETTA Cash, MD;  Location: [facility] CATH LAB;  Service: Cardiovascular;;   PERCUTANEOUS CORONARY STENT INTERVENTION (PCI-S)  05/08/2013   Procedure: PERCUTANEOUS CORONARY STENT INTERVENTION (PCI-S);  Surgeon: Lonni JONETTA Cash, MD;  Location: [facility] CATH LAB;  Service: Cardiovascular;;  mid LAD    TUBAL LIGATION     VAGINAL HYSTERECTOMY     VIDEO BRONCHOSCOPY WITH ENDOBRONCHIAL NAVIGATION N/A 02/17/2018   Procedure: VIDEO BRONCHOSCOPY WITH ENDOBRONCHIAL NAVIGATION;  Surgeon: Kerrin Elspeth BROCKS, MD;  Location: [facility] OR;  Service: Thoracic;  Laterality: N/A;    REVIEW OF SYSTEMS:   Constitutional: Positive for stable fatigue, decreased appetite, and weight loss. Negative for chills and fever.  HENT: Negative for mouth sores, nosebleeds, sore throat and trouble swallowing.   Eyes: Negative for eye problems and icterus.  Respiratory: Positive for dyspnea on exertion and intermittent cough. Negative for hemoptysis, and wheezing.   Cardiovascular: Intermittent left shoulder/rib pain. No chest pain at this  time. Negative for leg swelling.  Gastrointestinal: Positive for intermittent nausea/vomiting (improved compared to prior). Negative for abdominal pain.  Genitourinary:  Negative for bladder incontinence and hematuria.  Musculoskeletal: Negative for gait problem, neck pain and neck stiffness.  Skin: Positive for intermittent itching. Negative for rash.  Neurological: Negative for extremity weakness and seizures.  Hematological: Negative for adenopathy. Does not  bruise/bleed easily.  Psychiatric/Behavioral: Negative for confusion, depression and sleep disturbance. The patient is not nervous/anxious.      PHYSICAL EXAMINATION:  There were no vitals taken for this visit.  ECOG PERFORMANCE STATUS: 2-3  Physical Exam  Constitutional: Oriented to person, place, and time and chronically ill-appearing female and in no distress.  HENT:  Head: Normocephalic and atraumatic.  Mouth/Throat: Oropharynx is clear and moist. No oropharyngeal exudate.  Eyes: Conjunctivae are normal. Right eye exhibits no discharge. Left eye exhibits no discharge. No scleral icterus.  Neck: Normal range of motion. Neck supple Cardiovascular: Normal rate, regular rhythm, normal heart sounds and intact distal pulses.   Pulmonary/Chest: Effort normal. Quiet breath sounds bilaterally.  On supplemental oxygen.  No respiratory distress. No wheezes. No rales.  Abdominal: Soft. Bowel sounds are normal. Exhibits no distension and no mass. There is no tenderness.  Musculoskeletal: Normal range of motion. Exhibits no edema.  Lymphadenopathy:    No cervical adenopathy.  Neurological: Alert and oriented to person, place, and time. Exhibits muscle wasting.  Skin: Skin is warm and dry. No rash noted. Not diaphoretic. No erythema. No pallor.  Psychiatric: Mood, memory and judgment normal.  Vitals reviewed.  LABORATORY DATA: Lab Results  Component Value Date   WBC 9.9 09/27/2023   HGB 11.8 (L) 09/27/2023   HCT 36.5 09/27/2023    MCV 91.9 09/27/2023   PLT 227 09/27/2023      Chemistry      Component Value Date/Time   NA 142 09/27/2023 1440   NA 143 01/10/2018 1629   K 4.4 09/27/2023 1440   CL 105 09/27/2023 1440   CO2 31 09/27/2023 1440   BUN 25 (H) 09/27/2023 1440   BUN 14 01/10/2018 1629   CREATININE 1.34 (H) 09/27/2023 1440   CREATININE 0.68 08/05/2015 1456      Component Value Date/Time   CALCIUM  9.8 09/27/2023 1440   ALKPHOS 76 09/27/2023 1440   AST 15 09/27/2023 1440   ALT 12 09/27/2023 1440   BILITOT 0.5 09/27/2023 1440       RADIOGRAPHIC STUDIES:  CT CHEST ABDOMEN PELVIS W CONTRAST Result Date: 09/21/2023 CLINICAL DATA:  Adenocarcinoma of left lung. Staging. * Tracking Code: BO * EXAM: CT CHEST, ABDOMEN, AND PELVIS WITH CONTRAST TECHNIQUE: Multidetector CT imaging of the chest, abdomen and pelvis was performed following the standard protocol during bolus administration of intravenous contrast. RADIATION DOSE REDUCTION: This exam was performed according to the departmental dose-optimization program which includes automated exposure control, adjustment of the mA and/or kV according to patient size and/or use of iterative reconstruction technique. CONTRAST:  OMNIPAQUE  IOHEXOL  300 MG/ML  SOLN COMPARISON:  05/19/2023 CT scan and older FINDINGS: CT CHEST FINDINGS Cardiovascular: Right IJ chest port. Tip extends to the SVC right atrial junction. Heart is nonenlarged. No significant pericardial effusion. The thoracic aorta has normal caliber and has some scattered partially calcified atherosclerotic plaque. There is irregular plaque along the descending thoracic aorta in particular. Bovine type aortic arch. There is also a variant of an early origin of the left vertebral artery. Plaque is seen along the course of the great vessels. Significant coronary artery calcifications are seen. Please correlate for other coronary risk factors. Mediastinum/Nodes: Normal caliber thoracic esophagus. The thyroid  gland is  incompletely imaged on this examination. No specific abnormal lymph node enlargement identified in the axillary regions, hilum or mediastinum. Lungs/Pleura: Breathing motion. Emphysematous changes are identified. No consolidation, pneumothorax or effusion. There is some scattered ground-glass nodular areas identified and  some additional more confluent areas of nodularity. Previous example left upper lobe which measured 18 mm in transverse dimension, today on series 4, image 38 measures 18 mm. Going back to older study this has been increasing slowly since at least April 2023. The semi-solid nodule right upper lobe peripherally seen which previously measured 2.1 x 1.1 cm, today measures 2.2 x 1.0 cm. Lesion appears similar in level of confluence compared to previous. Going back to older study of April 2023 lesion would have measured 19 by 12 mm. Left lower lobe ground-glass area measuring 17 mm on the previous examination in the superior segment, today on image 34 of series 4 measures 17 mm. Going back to April 2023 when measured in a similar fashion this would have measured 17 mm. Other areas are also seen and similar to previous examination. Musculoskeletal: Scattered degenerative changes. Wedge deformity identified once again at the T5 level. This is also been present over 2 years. CT ABDOMEN PELVIS FINDINGS Hepatobiliary: Fatty liver infiltration. No space-occupying liver lesion. Slightly nodular contour to liver as well. Patent portal vein. Gallbladder is surgically absent. Pancreas: Unremarkable. No pancreatic ductal dilatation or surrounding inflammatory changes. Spleen: Normal in size without focal abnormality. Adrenals/Urinary Tract: Adrenal glands are preserved. There is moderate atrophy of the left kidney. Mild right, similar to previous. No collecting system dilatation. The ureters have normal course and caliber extending down to the urinary bladder. Preserved contour to the urinary bladder.  Stomach/Bowel: Stomach is within normal limits. Appendix appears normal. No evidence of bowel wall thickening, distention, or inflammatory changes. Scattered colonic diverticula. Vascular/Lymphatic: Aortic atherosclerosis. No enlarged abdominal or pelvic lymph nodes. Reproductive: Status post hysterectomy. No adnexal masses. Other: No free air or free fluid. Musculoskeletal: Scattered degenerative changes. Mild disc bulging identified particularly at L4-5 and L5-S1. IMPRESSION: Multiple bilateral semi-solid lung nodules are identified. These are not significant changed from the study of March 2025. There is 1 lesion left upper lobe which is been slowly increasing size from 2023. Recommend continued close surveillance. Fatty liver infiltration. Colonic diverticula. Diffuse atherosclerotic changes including along the coronary arteries. Emphysematous lung changes. Electronically Signed   By: Ranell Bring M.D.   On: 09/21/2023 12:37     ASSESSMENT/PLAN:  This is a very pleasant 68 year old Caucasian female diagnosed with stage IV non-small cell lung cancer, adenocarcinoma. She is negative for any actionable mutations and her PD-L1 expression is 1%. She was diagnosed in December 2019. She presented with bilateral pulmonary nodules.    She initially started treatment with carboplatin , Alimta , and Keytruda . She was status post 4 cycles but Keytruda  was discontinued after cycle #2 due to his significant skin rash. She then was treated with maintenance single agent Alimta  for 3 cycles. This has been on hold since June 2020 due to worsening renal insufficiency.  She has been on observation since that time and feeling fairly well except she does have significant COPD for which she is on supplemental oxygen. She follows closely with pulmonology.    She had a PET scan which showed evidence of disease progression with multiple bilateral solid and subsolid nodules.   Due to the patient's history of intolerance to  chemotherapy she was started on second line targeted treatment with Krazati  600 mg twice daily.  Her first dose was on 02/20/22. Dose was reduced to 400 mg twice daily around 03/03/2022 due to ongoing symptoms of nausea, vomiting, and diarrhea. She resumed this on 05/07/22.  The patient was seen with Dr. Sherrod today.  Dr. Sherrod personally and independently reviewed the scan and discussed results with the patient today.  The scan showed no evidence of disease progression. There is 1 lesion left upper lobe which is been slowly increasing size from 2023.  Dr. Sherrod recommends she continue on the same treatment at the same dose.   Chronic itching and dry skin -Generalized itching and dry skin, possibly dehydration-related. - Maintain hydration by drinking small amounts of water throughout the day. - Use hydrocortisone  cream for localized itching. - Take antihistamines like Claritin  or Zyrtec for generalized itching.   - Refer to gastroenterologist for evaluation and management of on going symptoms related to nausea/vomiting with eating which has been ongoing unrelated to her malignancy treatments. She is unable to eat large volumes of food at once without throwing up. On going chronic issue.   Chronic nausea and vomiting Improved with small, frequent meals. Zofran  sublingual effective. - Continue small, frequent meals. - Use Zofran  sublingual as needed for nausea.  Coronary artery disease with stents Intermittent chest pain, history of stents, plaque buildup, possible cardiac-related breathing difficulties. - Contact cardiologist, for evaluation and testing. - Seek emergency care if experiencing heart attack symptoms.  She will see Dr. Darlean for her COPD.   We will see her for labs and follow up in 2 months.   The patient was advised to call immediately if she has any concerning symptoms in the interval. The patient voices understanding of current disease status and treatment options and  is in agreement with the current care plan. All questions were answered. The patient knows to call the clinic with any problems, questions or concerns. We can certainly see the patient much sooner if necessary       Orders Placed This Encounter  Procedures   CBC with Differential (Cancer Center Only)    Standing Status:   Future    Expected Date:   11/11/2023    Expiration Date:   09/26/2024   CMP (Cancer Center only)    Standing Status:   Future    Expected Date:   11/11/2023    Expiration Date:   09/26/2024   Ambulatory referral to Gastroenterology    Referral Priority:   Routine    Referral Type:   Consultation    Referral Reason:   Specialty Services Required    Number of Visits Requested:   1      Aurianna Earlywine L Laney Louderback, PA-C 09/27/23  ADDENDUM: Hematology/Oncology Attending: I had a face-to-face encounter with the patient today.  I reviewed his records, lab, scan and recommended her care plan.  This is a very pleasant 68 years old white female with a stage IV non-small cell lung cancer, adenocarcinoma diagnosed in December 2019 was positive KRAS G12C mutation and PD-L1 expression of 1%.  The patient initially started treatment with systemic chemoimmunotherapy with carboplatin , Alimta  and Keytruda  every 3 weeks for 4 cycles followed by maintenance treatment with single agent Alimta  every 3 weeks for 3 cycles discontinued secondary to renal insufficiency and intolerance.  She could not tolerate Keytruda .  The patient then was on observation for several years before she had evidence for disease progression and she started treatment with Krazati  (Adagrasib ) currently on 400 mg p.o. twice daily started in December 2023.  She has been tolerating this treatment fairly well with no concerning adverse effects.  She continues to have baseline fatigue and shortness of breath with exertion.  She had repeat CT scan of the chest, abdomen and pelvis performed recently.  I personally and  independently reviewed the scan and discussed the result with the patient.  HIDA scan showed no concerning findings for disease progression. I recommended for her to continue on her current treatment with Krazati  (Adagrasib ) 400 mg p.o. twice daily. She will come back for follow-up visit in 2 months for evaluation and repeat blood work and we will repeat her imaging studies in 4 months. The patient was advised to call immediately if she has any other concerning symptoms in the interval. The total time spent in the appointment was 30 minutes including review of chart and various tests results, discussions about plan of care and coordination of care plan . Disclaimer: This note was dictated with voice recognition software. Similar sounding words can inadvertently be transcribed and may be missed upon review. Sherrod MARLA Sherrod, MD

## 2023-09-26 ENCOUNTER — Other Ambulatory Visit: Payer: Self-pay | Admitting: Physician Assistant

## 2023-09-26 DIAGNOSIS — C3492 Malignant neoplasm of unspecified part of left bronchus or lung: Secondary | ICD-10-CM

## 2023-09-27 ENCOUNTER — Ambulatory Visit: Admitting: Nurse Practitioner

## 2023-09-27 ENCOUNTER — Other Ambulatory Visit

## 2023-09-27 ENCOUNTER — Inpatient Hospital Stay: Attending: Physician Assistant | Admitting: Physician Assistant

## 2023-09-27 ENCOUNTER — Inpatient Hospital Stay

## 2023-09-27 DIAGNOSIS — C3492 Malignant neoplasm of unspecified part of left bronchus or lung: Secondary | ICD-10-CM | POA: Diagnosis present

## 2023-09-27 DIAGNOSIS — I2510 Atherosclerotic heart disease of native coronary artery without angina pectoris: Secondary | ICD-10-CM | POA: Insufficient documentation

## 2023-09-27 DIAGNOSIS — C3491 Malignant neoplasm of unspecified part of right bronchus or lung: Secondary | ICD-10-CM | POA: Diagnosis not present

## 2023-09-27 DIAGNOSIS — Z95828 Presence of other vascular implants and grafts: Secondary | ICD-10-CM

## 2023-09-27 DIAGNOSIS — N289 Disorder of kidney and ureter, unspecified: Secondary | ICD-10-CM | POA: Insufficient documentation

## 2023-09-27 DIAGNOSIS — J449 Chronic obstructive pulmonary disease, unspecified: Secondary | ICD-10-CM | POA: Insufficient documentation

## 2023-09-27 DIAGNOSIS — Z9221 Personal history of antineoplastic chemotherapy: Secondary | ICD-10-CM | POA: Diagnosis not present

## 2023-09-27 DIAGNOSIS — R112 Nausea with vomiting, unspecified: Secondary | ICD-10-CM | POA: Insufficient documentation

## 2023-09-27 DIAGNOSIS — L299 Pruritus, unspecified: Secondary | ICD-10-CM | POA: Diagnosis not present

## 2023-09-27 LAB — CBC WITH DIFFERENTIAL (CANCER CENTER ONLY)
Abs Immature Granulocytes: 0.04 K/uL (ref 0.00–0.07)
Basophils Absolute: 0.1 K/uL (ref 0.0–0.1)
Basophils Relative: 1 %
Eosinophils Absolute: 0.6 K/uL — ABNORMAL HIGH (ref 0.0–0.5)
Eosinophils Relative: 6 %
HCT: 36.5 % (ref 36.0–46.0)
Hemoglobin: 11.8 g/dL — ABNORMAL LOW (ref 12.0–15.0)
Immature Granulocytes: 0 %
Lymphocytes Relative: 15 %
Lymphs Abs: 1.5 K/uL (ref 0.7–4.0)
MCH: 29.7 pg (ref 26.0–34.0)
MCHC: 32.3 g/dL (ref 30.0–36.0)
MCV: 91.9 fL (ref 80.0–100.0)
Monocytes Absolute: 0.7 K/uL (ref 0.1–1.0)
Monocytes Relative: 7 %
Neutro Abs: 7.1 K/uL (ref 1.7–7.7)
Neutrophils Relative %: 71 %
Platelet Count: 227 K/uL (ref 150–400)
RBC: 3.97 MIL/uL (ref 3.87–5.11)
RDW: 13.5 % (ref 11.5–15.5)
WBC Count: 9.9 K/uL (ref 4.0–10.5)
nRBC: 0 % (ref 0.0–0.2)

## 2023-09-27 LAB — CMP (CANCER CENTER ONLY)
ALT: 12 U/L (ref 0–44)
AST: 15 U/L (ref 15–41)
Albumin: 4.2 g/dL (ref 3.5–5.0)
Alkaline Phosphatase: 76 U/L (ref 38–126)
Anion gap: 6 (ref 5–15)
BUN: 25 mg/dL — ABNORMAL HIGH (ref 8–23)
CO2: 31 mmol/L (ref 22–32)
Calcium: 9.8 mg/dL (ref 8.9–10.3)
Chloride: 105 mmol/L (ref 98–111)
Creatinine: 1.34 mg/dL — ABNORMAL HIGH (ref 0.44–1.00)
GFR, Estimated: 43 mL/min — ABNORMAL LOW (ref >60)
Glucose, Bld: 117 mg/dL — ABNORMAL HIGH (ref 70–99)
Potassium: 4.4 mmol/L (ref 3.5–5.1)
Sodium: 142 mmol/L (ref 135–145)
Total Bilirubin: 0.5 mg/dL (ref 0.0–1.2)
Total Protein: 7 g/dL (ref 6.5–8.1)

## 2023-09-27 MED ORDER — SODIUM CHLORIDE 0.9% FLUSH
10.0000 mL | Freq: Once | INTRAVENOUS | Status: AC
Start: 2023-09-27 — End: 2023-09-27
  Administered 2023-09-27: 10 mL

## 2023-09-27 MED ORDER — HEPARIN SOD (PORK) LOCK FLUSH 100 UNIT/ML IV SOLN
500.0000 [IU] | Freq: Once | INTRAVENOUS | Status: AC
Start: 2023-09-27 — End: 2023-09-27
  Administered 2023-09-27: 500 [IU]

## 2023-09-28 ENCOUNTER — Other Ambulatory Visit (HOSPITAL_COMMUNITY): Payer: Self-pay

## 2023-09-28 NOTE — Progress Notes (Signed)
 Specialty Pharmacy Ongoing Clinical Assessment Note  [name] is a 68 y.o. female who is being followed by the specialty pharmacy service for RxSp Oncology   Patient's specialty medication(s) reviewed today: Adagrasib  (Krazati )   Missed doses in the last 4 weeks: 2   Patient/Caregiver did not have any additional questions or concerns.   Therapeutic benefit summary: Patient is achieving benefit   Adverse events/side effects summary: Experienced adverse events/side effects (Ongoing, tolearble stomach issues, pt unsure that they are from her medication, as she had them prior. Also skin rash after being in the sun, we discussed staying out of the sun and using appropriate UV protection, the patient was understanding)   Patient's therapy is appropriate to: Continue    Goals Addressed             This Visit's Progress    Slow Disease Progression   On track    Patient is on track. Patient will maintain adherence.  Per office visit notes from 09/27/23, the recent scan showed no concerning findings for disease progression.         Follow up: 6 months  Silvano LOISE Dolly Specialty Pharmacist

## 2023-09-28 NOTE — Progress Notes (Signed)
 Specialty Pharmacy Refill Coordination Note  [name] is a 68 y.o. female contacted today regarding refills of specialty medication(s) Adagrasib  (Krazati )   Patient requested Delivery   Delivery date: 10/07/23   Verified address: [address]   [place] [id number]   Medication will be filled on 10/06/23.

## 2023-10-06 ENCOUNTER — Other Ambulatory Visit: Payer: Self-pay

## 2023-10-18 ENCOUNTER — Other Ambulatory Visit (HOSPITAL_BASED_OUTPATIENT_CLINIC_OR_DEPARTMENT_OTHER): Payer: Self-pay

## 2023-10-18 ENCOUNTER — Other Ambulatory Visit: Payer: Self-pay | Admitting: Internal Medicine

## 2023-10-18 MED ORDER — BREZTRI AEROSPHERE 160-9-4.8 MCG/ACT IN AERO: 2.0000 | INHALATION_SPRAY | Freq: Two times a day (BID) | RESPIRATORY_TRACT | 1 refills | Status: DC

## 2023-10-18 NOTE — Telephone Encounter (Unsigned)
 Copied from CRM [id number]. Topic: Clinical - Medication Refill >> Oct 18, 2023 12:03 PM Celestine FALCON wrote: Medication: Budeson-Glycopyrrol-Formoterol  (BREZTRI  AEROSPHERE) 160-9-4.8 MCG/ACT AERO   Has the patient contacted their pharmacy? Yes; pt said she needed a new prescription.  (Agent: If no, request that the patient contact the pharmacy for the refill. If patient does not wish to contact the pharmacy document the reason why and proceed with request.) (Agent: If yes, when and what did the pharmacy advise?)  This is the patient's preferred pharmacy:  [id number] [address], [place] - [address] AT [facility] [address] [facility] [address] [place] [address] Phone: [phone] Fax: [phone]  Is this the correct pharmacy for this prescription? Yes If no, delete pharmacy and type the correct one.   Has the prescription been filled recently? No  Is the patient out of the medication? Yes  Has the patient been seen for an appointment in the last year OR does the patient have an upcoming appointment? Yes  Can we respond through MyChart? Yes  Agent: Please be advised that Rx refills may take up to 3 business days. We ask that you follow-up with your pharmacy.

## 2023-10-29 ENCOUNTER — Other Ambulatory Visit (HOSPITAL_COMMUNITY): Payer: Self-pay

## 2023-10-29 ENCOUNTER — Other Ambulatory Visit: Payer: Self-pay | Admitting: Physician Assistant

## 2023-10-29 ENCOUNTER — Other Ambulatory Visit: Payer: Self-pay

## 2023-10-29 MED ORDER — KRAZATI 200 MG PO TABS
400.0000 mg | ORAL_TABLET | Freq: Two times a day (BID) | ORAL | 3 refills | Status: AC
  Filled 2023-10-29: qty 180, 45d supply, fill #0
  Filled 2023-11-03 – 2023-11-24 (×2): qty 120, 30d supply, fill #0
  Filled 2023-12-22 – 2023-12-31 (×2): qty 120, 30d supply, fill #1
  Filled 2024-01-26: qty 120, 30d supply, fill #2
  Filled 2024-02-22: qty 120, 30d supply, fill #3
  Filled 2024-03-23: qty 120, 30d supply, fill #4

## 2023-11-03 ENCOUNTER — Other Ambulatory Visit: Payer: Self-pay

## 2023-11-03 ENCOUNTER — Ambulatory Visit: Admitting: Internal Medicine

## 2023-11-03 ENCOUNTER — Other Ambulatory Visit (HOSPITAL_COMMUNITY): Payer: Self-pay

## 2023-11-05 ENCOUNTER — Other Ambulatory Visit: Payer: Self-pay

## 2023-11-08 ENCOUNTER — Ambulatory Visit: Admitting: Adult Health

## 2023-11-08 DIAGNOSIS — J449 Chronic obstructive pulmonary disease, unspecified: Secondary | ICD-10-CM

## 2023-11-08 DIAGNOSIS — C3492 Malignant neoplasm of unspecified part of left bronchus or lung: Secondary | ICD-10-CM

## 2023-11-24 ENCOUNTER — Other Ambulatory Visit: Payer: Self-pay

## 2023-11-24 NOTE — Progress Notes (Signed)
 Specialty Pharmacy Refill Coordination Note  [name] is a 68 y.o. female contacted today regarding refills of specialty medication(s) Adagrasib  (Krazati )   Patient requested Delivery   Delivery date: 11/26/23   Verified address: [address]   [place] [address]   Medication will be filled on 11/24/24.  [name], CPhT [place]  [facility] Pharmacy Technician Patient Advocate [facility] [place] Phone: [phone]  Fax: [phone] [name]

## 2023-11-25 ENCOUNTER — Other Ambulatory Visit: Payer: Self-pay

## 2023-11-29 ENCOUNTER — Telehealth: Payer: Self-pay | Admitting: Physician Assistant

## 2023-11-29 ENCOUNTER — Inpatient Hospital Stay

## 2023-11-29 ENCOUNTER — Inpatient Hospital Stay: Admitting: Internal Medicine

## 2023-11-29 NOTE — Telephone Encounter (Signed)
 Rescheduled appointments per the patients request due to having shingles. The patient is aware of the changes made.

## 2023-12-02 ENCOUNTER — Encounter: Payer: Self-pay | Admitting: Internal Medicine

## 2023-12-02 ENCOUNTER — Ambulatory Visit: Admitting: Internal Medicine

## 2023-12-06 ENCOUNTER — Ambulatory Visit: Admitting: Pulmonary Disease

## 2023-12-07 ENCOUNTER — Ambulatory Visit: Admitting: Pulmonary Disease

## 2023-12-07 ENCOUNTER — Encounter: Payer: Self-pay | Admitting: Pulmonary Disease

## 2023-12-07 VITALS — BP 128/80 | HR 81 | Temp 98.3°F | Ht 67.0 in | Wt 175.0 lb

## 2023-12-07 DIAGNOSIS — J45909 Unspecified asthma, uncomplicated: Secondary | ICD-10-CM | POA: Diagnosis not present

## 2023-12-07 DIAGNOSIS — J449 Chronic obstructive pulmonary disease, unspecified: Secondary | ICD-10-CM | POA: Diagnosis not present

## 2023-12-07 MED ORDER — BREZTRI AEROSPHERE 160-9-4.8 MCG/ACT IN AERO: 2.0000 | INHALATION_SPRAY | Freq: Two times a day (BID) | RESPIRATORY_TRACT | 1 refills | Status: DC

## 2023-12-07 MED ORDER — ALBUTEROL SULFATE HFA 108 (90 BASE) MCG/ACT IN AERS: 1.0000 | INHALATION_SPRAY | Freq: Four times a day (QID) | RESPIRATORY_TRACT | 5 refills | Status: AC | PRN

## 2023-12-07 MED ORDER — MONTELUKAST SODIUM 10 MG PO TABS: 10.0000 mg | ORAL_TABLET | Freq: Every day | ORAL | 11 refills | Status: AC

## 2023-12-07 NOTE — Patient Instructions (Signed)
  VISIT SUMMARY: Today, we discussed your worsening respiratory symptoms related to severe COPD, asthma, and stage IV lung adenocarcinoma. We reviewed your current treatments and made some adjustments to help manage your symptoms better.  YOUR PLAN: SEVERE CHRONIC OBSTRUCTIVE PULMONARY DISEASE (COPD) AND ASTHMA OVERLAP SYNDROME: You have severe COPD and asthma, which are causing significant breathing difficulties and other symptoms. -Continue using your Breztri  inhaler as it helps control your symptoms. -Start taking Singulair  to help reduce inflammation and improve your breathing. -We will schedule a lung function test to reassess your current status. -If your symptoms persist, we may consider newer nebulizers like Ohtuvayre  -If your symptoms are still not controlled and your eosinophil levels remain high, we may evaluate injection therapies such as Dupixent or Nucala.

## 2023-12-07 NOTE — Progress Notes (Signed)
 "              [name]    [id number]    1955/10/16  Primary Care Physician:Morton, Suzen HERO, NP  Referring Physician: [name], NP [address] [place],  [place] [address]  Chief complaint: Acute visit for worsening COPD  HPI: 68 y.o. who  has a past medical history of Anemia, Anxiety, Arthritis, Asthma, Celiac disease, Colon polyps, COPD (chronic obstructive pulmonary disease) (HCC), Coronary artery disease, Depression, Fibromyalgia, Gallstones, GERD (gastroesophageal reflux disease), H/O hiatal hernia, Hepatitis, Hyperlipidemia, Hypertension, Hypothyroidism, IBS (irritable bowel syndrome), Interstitial cystitis, Lung cancer (HCC) (dx'd 01/2018), Lung nodules, Migraines, Pneumonia, PONV (postoperative nausea and vomiting), Premature atrial contractions, PVC's (premature ventricular contractions), Sinus headache, and SVT (supraventricular tachycardia).  Discussed the use of AI scribe software for clinical note transcription with the patient, who gave verbal consent to proceed.  History of Present Illness [name] is a 68 year old female with severe COPD, asthma, and stage four lung adenocarcinoma who presents with worsening respiratory symptoms.  Dyspnea and exercise intolerance - Severe shortness of breath limiting daily activities - Heart rate increases to 135 bpm with exertion - Nasal discharge occurs when breathing is compromised - No chest pain, shortness of breath, or palpitations outside of exertional episodes  Chronic obstructive pulmonary disease and asthma management - Severe COPD and asthma - Breztri  inhaler provides more effective symptom control compared to previous inhalers  Lung adenocarcinoma disease course and treatment - Stage IV lung adenocarcinoma diagnosed in 2019 - Previously treated with chemotherapy - Currently on Karazati, well tolerated - Regular CT scans performed by oncology, most recent in July 2025  Cardiac rate control -  Metoprolol  used for heart rate control during exertion   Relevant Pulmonary history: Pets: Dog Occupation: Used to own a daycare Exposures: No mold, hot tub, Jacuzzi.  No feather pillow comforter No h/o chemo/XRT/amiodarone/macrodantin/MTX  No exposure to asbestos, silica or other organic allergens  Smoking history: 15-pack-year smoker.  Quit in 2009 Travel history: No significant recent travel Family history: No family history of lung disease   Outpatient Encounter Medications as of 12/07/2023  Medication Sig   adagrasib  (KRAZATI ) 200 MG tablet Take 2 tablets (400 mg total) by mouth 2 (two) times daily.   aspirin  81 MG chewable tablet Chew 1 tablet (81 mg total) by mouth daily.   buPROPion  (WELLBUTRIN  XL) 150 MG 24 hr tablet Take 1-2 tablets by mouth 2 (two) times daily. Patient takes 2 tablets (300mg ) in the morning and 1 tablet (150mg ) in the evening   ipratropium-albuterol  (DUONEB) 0.5-2.5 (3) MG/3ML SOLN Inhale 3 mLs into the lungs every 6 (six) hours as needed (Asthma).   LORazepam  (ATIVAN ) 2 MG tablet Take 2 mg by mouth every evening.   metoprolol  tartrate (LOPRESSOR ) 25 MG tablet Take 0.5 tablets (12.5 mg total) by mouth 2 (two) times daily.   montelukast  (SINGULAIR ) 10 MG tablet Take 1 tablet (10 mg total) by mouth at bedtime.   nitroGLYCERIN  (NITROSTAT ) 0.4 MG SL tablet Place 1 tablet (0.4 mg total) under the tongue every 5 (five) minutes as needed for chest pain.   ondansetron  (ZOFRAN -ODT) 8 MG disintegrating tablet Take 1 tablet (8 mg total) by mouth every 8 (eight) hours as needed for nausea or vomiting.   albuterol  (PROVENTIL ) (5 MG/ML) 0.5% nebulizer solution Take 2.5 mg by nebulization every 6 (six) hours as needed for wheezing.   albuterol  (VENTOLIN  HFA) 108 (90 Base) MCG/ACT inhaler Inhale 1 puff  into the lungs every 6 (six) hours as needed (wheezing/shortness of breath.).   budesonide -glycopyrrolate -formoterol  (BREZTRI  AEROSPHERE) 160-9-4.8 MCG/ACT AERO inhaler Inhale 2  puffs into the lungs in the morning and at bedtime.   doxycycline  (VIBRA -TABS) 100 MG tablet Take 1 tablet (100 mg total) by mouth 2 (two) times daily. (Patient not taking: Reported on 12/07/2023)   ferrous sulfate  (FEROSUL) 325 (65 FE) MG tablet TAKE 1 TABLET(325 MG) BY MOUTH DAILY WITH BREAKFAST (Patient not taking: Reported on 12/07/2023)   gabapentin  (NEURONTIN ) 100 MG capsule TAKE 1 CAPSULE(100 MG) BY MOUTH THREE TIMES DAILY (Patient not taking: Reported on 12/07/2023)   loperamide  (IMODIUM  A-D) 2 MG tablet Take 1 tablet (2 mg total) by mouth 4 (four) times daily as needed for diarrhea or loose stools. (Patient not taking: Reported on 12/07/2023)   [DISCONTINUED] albuterol  (VENTOLIN  HFA) 108 (90 Base) MCG/ACT inhaler Inhale 1 puff into the lungs every 6 (six) hours as needed (wheezing/shortness of breath.).    [DISCONTINUED] budesonide -glycopyrrolate -formoterol  (BREZTRI  AEROSPHERE) 160-9-4.8 MCG/ACT AERO inhaler Inhale 2 puffs into the lungs in the morning and at bedtime.   No facility-administered encounter medications on file as of 12/07/2023.     Physical Exam: Today's Vitals   12/07/23 1454  BP: 128/80  Pulse: 81  Temp: 98.3 F (36.8 C)  TempSrc: Oral  SpO2: 98%  Weight: 175 lb (79.4 kg)  Height: 5' 7 (1.702 m)   Body mass index is 27.41 kg/m.  Physical Exam GEN: No acute distress CV: Regular rate and rhythm no murmurs LUNGS: Clear to auscultation bilaterally normal respiratory effort SKIN JOINTS: Warm and dry no rash    Data Reviewed: Imaging: CT chest 09/15/2023-multiple bilateral semisolid lung nodules.  Slowly increasing left upper lobe nodule. I have reviewed the images personally.  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 She experiences significant dyspnea, tachycardia, and nasal symptoms. Elevated eosinophils suggest an inflammatory component likely related to asthma. Smoking history may have contributed to the severity of her condition. Current inhaler, Breztri , is effective but not sufficient to control symptoms fully. She is interested in exploring newer treatment options. - Continue Breztri  inhaler. - Initiate Singulair  to address inflammation and improve respiratory symptoms. - Order lung function test to reassess current status. - Consider newer nebulizers like Ohtuvayre  if symptoms persist. - Evaluate for injection therapies such as Dupixent or Nucala if elevated eosinophils persist and symptoms are not controlled with current treatment.  Recommendations: Continue Breztri , start Singulair  PFTs  I personally spent a total of 42 minutes in the care of the patient today including preparing to see the patient, getting/reviewing separately obtained history, performing a medically appropriate exam/evaluation, counseling and educating, placing orders, independently interpreting results, and communicating results.   Lonna Coder MD [facility] 12/07/2023, 3:32 PM  CC: [name], NP   "

## 2023-12-08 NOTE — Progress Notes (Signed)
 [facility] OFFICE PROGRESS NOTE  [name], NP [address] [place] [address]  DIAGNOSIS: Stage IV (T1a, N0, M1 a) non-small cell lung cancer, adenocarcinoma presented with multifocal disease in both lungs including 3 nodules in the left lung as well as 2 nodules in the right lung diagnosed in December 2019.     Molecular studies showed: KRAS G12C BCOR N1425S-subclonal RAD21 amplification   PDL 1 expression 1%  PRIOR THERAPY: 1) Systemic chemotherapy with carboplatin  for AUC of 5, Alimta  500 mg/M2 and Keytruda  200 mg IV every 3 weeks.  First dose April 12, 2018. Status post 4 cycles. starting from cycle #3, Keytruda  was dropped and patient received treatment with Carboplatin  for an AUC of 5 and Alimta  500 mg/m2 IV every 3 weeks.  2) Systemic chemotherapy with Alimta  500 mg/m2 IV every 3 weeks. Status post 3 cycles of single agent Alimta . Most recent dose given on 08/23/2018.  The treatment is currently on hold secondary to renal insufficiency  CURRENT THERAPY: Krazati  600 mg BID, first dose on 02/20/22. Dose reduced to 400 mg BID starting on 03/10/22. Her treatment was on hold from 03/15/22-05/07/22.   INTERVAL HISTORY: [name] 68 y.o. female returns to the clinic today for a follow-up visit. The patient was last seen in clinic by 09/27/23.   The patient is currently on a reduced dose of treatment with Krazati . She tolerates this well without any adverse side effects.   In general the patient does have a lot of unusual symptoms and side effects that have been going on predating Krazati  and poor overall health due to her COPD. For example she had been having nausea and vomiting and intermittent diarrhea ongoing even while off treatment on observation for several years.  The diarrhea started after having her gallbladder removed.  She has not been reporting any unusual diarrhea recently. She did not report any unusual nausea. She uses sublingual zofran  which  is effective for her.    She experiences generalized itching all over her body, but particularly on her back.  She has not taken antihistamine on a regular basis.  Her eosinophils are occasionally high.  She states that at a different doctor's appointment someone had mentioned possible allergy  injections.  She has an upcoming appointment with her PCP.  She thinks she may be allergic to her dog.  She spent a month visiting her granddaughter and her symptoms had improved during that time.  She has asthma as well.  Gained weight since last being seen.  The patient has not seen her cardiologist in several years.  She has history of stents.  She has known coronary artery disease.  The patient had not follow-up with cardiology because she does not feel that she would be a candidate for any intervention but she does notice some increase symptoms for angina.  She is not having any symptoms at this time.  She states she will call her cardiologist tomorrow.  She states she cannot take her nitroglycerin  because it exacerbates migraines.  Therefore she will take aspirin  which helps.  Due to her severe COPD, she is not as active. If she moves around, she has tachycardia even with minimal exertion. She is on supplemental oxygen at baseline 3-4 L. She recently saw Dr. Theophilus.  Arranging for repeat PFTs.  She also had shingles recently.  Has a skin lesion on her left upper leg that she thought was a spider bite.  Has mild erythema around it.  However it has been there for 2 months.  She experiences bladder urgency, particularly when her heart rate increases, but has not needed to wear diapers recently. She has a history of anemia and plans to resume iron supplements.  She is here today for labs and a follow-up visit.  MEDICAL HISTORY: Past Medical History:  Diagnosis Date   Anemia    long time ago (12/06/2012)   Anxiety    Arthritis    right knee real bad; in my hands bad (12/06/2012)   Asthma    Celiac  disease    Colon polyps    COPD (chronic obstructive pulmonary disease) (HCC)    Coronary artery disease    a. s/p Xience DES to [facility] 04/2008;  b. LHC 05/2008: Proximal RCA 25%, mid RCA stent patent.  c. Anormal nuc 2014 -> s/p LHC with severe mRCA stenosis s/p DES. d. Cath 04/2013 s/p DES to LAD.  e. LHC 08/2015 was stable.   Depression    years ago (12/06/2012)   Fibromyalgia    Gallstones    GERD (gastroesophageal reflux disease)    H/O hiatal hernia    Hepatitis    not A, B, or C (12/06/2012)   Hyperlipidemia    intol to statins and other chol agents due to elevated LFTs   Hypertension    Hypothyroidism    IBS (irritable bowel syndrome)    Interstitial cystitis    Lung cancer (HCC) dx'd 01/2018   Lung nodules    Bilateral   Migraines    when I was younger (12/06/2012)   Pneumonia    PONV (postoperative nausea and vomiting)    Very bad   Premature atrial contractions    PVC's (premature ventricular contractions)    Sinus headache    SVT (supraventricular tachycardia)     ALLERGIES:  is allergic to ciprofloxacin, levofloxacin, statins, tricor  [fenofibrate ], clarithromycin, compazine  [prochlorperazine ], latex, sulfamethoxazole-trimethoprim, codeine, and metoprolol .  MEDICATIONS:  Current Outpatient Medications  Medication Sig Dispense Refill   adagrasib  (KRAZATI ) 200 MG tablet Take 2 tablets (400 mg total) by mouth 2 (two) times daily. 180 tablet 3   albuterol  (VENTOLIN  HFA) 108 (90 Base) MCG/ACT inhaler Inhale 1 puff into the lungs every 6 (six) hours as needed (wheezing/shortness of breath.). 1 each 5   aspirin  81 MG chewable tablet Chew 1 tablet (81 mg total) by mouth daily.     BELSOMRA  20 MG TABS Take 1 tablet by mouth at bedtime as needed.     budesonide-glycopyrrolate-formoterol  (BREZTRI  AEROSPHERE) 160-9-4.8 MCG/ACT AERO inhaler Inhale 2 puffs into the lungs in the morning and at bedtime. 10.7 g 1   buPROPion  (WELLBUTRIN  XL) 150 MG 24 hr tablet Take 1-2 tablets by  mouth 2 (two) times daily. Patient takes 2 tablets (300mg ) in the morning and 1 tablet (150mg ) in the evening     doxycycline  (VIBRA -TABS) 100 MG tablet Take 1 tablet (100 mg total) by mouth 2 (two) times daily. 14 tablet 0   gabapentin  (NEURONTIN ) 100 MG capsule TAKE 1 CAPSULE(100 MG) BY MOUTH THREE TIMES DAILY (Patient taking differently: Take 100 mg by mouth as needed.) 90 capsule 2   HYDROcodone -acetaminophen  (NORCO) 7.5-325 MG tablet Take 1 tablet by mouth every 6 (six) hours as needed.     ipratropium-albuterol  (DUONEB) 0.5-2.5 (3) MG/3ML SOLN Inhale 3 mLs into the lungs every 6 (six) hours as needed (Asthma). 360 mL 3   loperamide  (IMODIUM  A-D) 2 MG tablet Take 1 tablet (2 mg total) by mouth 4 (  four) times daily as needed for diarrhea or loose stools. 20 tablet 0   LORazepam  (ATIVAN ) 2 MG tablet Take 2 mg by mouth every evening.     metoprolol  tartrate (LOPRESSOR ) 25 MG tablet Take 0.5 tablets (12.5 mg total) by mouth 2 (two) times daily. 90 tablet 3   montelukast  (SINGULAIR ) 10 MG tablet Take 1 tablet (10 mg total) by mouth at bedtime. 30 tablet 11   ondansetron  (ZOFRAN -ODT) 8 MG disintegrating tablet Take 1 tablet (8 mg total) by mouth every 8 (eight) hours as needed for nausea or vomiting. 30 tablet 2   albuterol  (PROVENTIL ) (5 MG/ML) 0.5% nebulizer solution Take 2.5 mg by nebulization every 6 (six) hours as needed for wheezing.     doxycycline  (VIBRA -TABS) 100 MG tablet Take 1 tablet (100 mg total) by mouth 2 (two) times daily. (Patient not taking: Reported on 12/07/2023) 20 tablet 0   ferrous sulfate  (FEROSUL) 325 (65 FE) MG tablet TAKE 1 TABLET(325 MG) BY MOUTH DAILY WITH BREAKFAST (Patient not taking: Reported on 12/14/2023) 30 tablet 2   levothyroxine  (SYNTHROID ) 50 MCG tablet Take 50 mcg by mouth every morning.     nitroGLYCERIN  (NITROSTAT ) 0.4 MG SL tablet Place 1 tablet (0.4 mg total) under the tongue every 5 (five) minutes as needed for chest pain. (Patient not taking: Reported on  12/14/2023) 25 tablet 12   No current facility-administered medications for this visit.    SURGICAL HISTORY:  Past Surgical History:  Procedure Laterality Date   CARDIAC CATHETERIZATION  04/2008; 05/2008   CARDIAC CATHETERIZATION N/A 08/07/2015   Procedure: Left Heart Cath and Coronary Angiography;  Surgeon: Ozell Fell, MD;  Location: [facility] INVASIVE CV LAB;  Service: Cardiovascular;  Laterality: N/A;   CHOLECYSTECTOMY     CORONARY ANGIOPLASTY WITH STENT PLACEMENT  04/2008 12/06/2012   1 + 1 (12/06/2012)   CORONARY STENT PLACEMENT  05/08/2013   DES TO LAD      DR MCALHANY   IR IMAGING GUIDED PORT INSERTION  04/29/2018   LEFT HEART CATHETERIZATION WITH CORONARY ANGIOGRAM N/A 12/06/2012   Procedure: LEFT HEART CATHETERIZATION WITH CORONARY ANGIOGRAM;  Surgeon: Lonni JONETTA Cash, MD;  Location: [facility] CATH LAB;  Service: Cardiovascular;  Laterality: N/A;   LEFT HEART CATHETERIZATION WITH CORONARY ANGIOGRAM N/A 05/08/2013   Procedure: LEFT HEART CATHETERIZATION WITH CORONARY ANGIOGRAM;  Surgeon: Lonni JONETTA Cash, MD;  Location: [facility] CATH LAB;  Service: Cardiovascular;  Laterality: N/A;   LEFT HEART CATHETERIZATION WITH CORONARY ANGIOGRAM N/A 01/04/2014   Procedure: LEFT HEART CATHETERIZATION WITH CORONARY ANGIOGRAM;  Surgeon: Lonni JONETTA Cash, MD;  Location: [facility] CATH LAB;  Service: Cardiovascular;  Laterality: N/A;   PERCUTANEOUS CORONARY STENT INTERVENTION (PCI-S)  12/06/2012   Procedure: PERCUTANEOUS CORONARY STENT INTERVENTION (PCI-S);  Surgeon: Lonni JONETTA Cash, MD;  Location: [facility] CATH LAB;  Service: Cardiovascular;;   PERCUTANEOUS CORONARY STENT INTERVENTION (PCI-S)  05/08/2013   Procedure: PERCUTANEOUS CORONARY STENT INTERVENTION (PCI-S);  Surgeon: Lonni JONETTA Cash, MD;  Location: [facility] CATH LAB;  Service: Cardiovascular;;  mid LAD    TUBAL LIGATION     VAGINAL HYSTERECTOMY     VIDEO BRONCHOSCOPY WITH ENDOBRONCHIAL NAVIGATION N/A 02/17/2018   Procedure: VIDEO BRONCHOSCOPY WITH  ENDOBRONCHIAL NAVIGATION;  Surgeon: Kerrin Elspeth BROCKS, MD;  Location: [facility] OR;  Service: Thoracic;  Laterality: N/A;    REVIEW OF SYSTEMS:   Constitutional: Positive for stable fatigue.Negative for chills and fever.  HENT: Negative for mouth sores, nosebleeds, sore throat and trouble swallowing.   Eyes: Negative for eye problems and  icterus.  Respiratory: Positive for dyspnea on exertion. Negative for hemoptysis, and wheezing.   Cardiovascular: Intermittent left shoulder/rib pain. No chest pain at this time. Negative for leg swelling.  Gastrointestinal: Positive for intermittent nausea/vomiting (improved compared to prior). Negative for abdominal pain.  Genitourinary:  Negative for bladder incontinence and hematuria.  Musculoskeletal: Negative for gait problem, neck pain and neck stiffness.  Skin: Positive for intermittent itching. Negative for rash. Small localized area of erythema on the left upper leg.  Neurological: Negative for extremity weakness and seizures.  Hematological: Negative for adenopathy. Does not bruise/bleed easily.  Psychiatric/Behavioral: Negative for confusion, depression and sleep disturbance. The patient is not nervous/anxious.     PHYSICAL EXAMINATION:  Blood pressure (!) 162/88, pulse 98, temperature 98.1 F (36.7 C), temperature source Temporal, resp. rate 20, weight 181 lb 12.8 oz (82.5 kg), SpO2 100%.  ECOG PERFORMANCE STATUS: 2-3  Physical Exam  Constitutional: Oriented to person, place, and time and chronically ill-appearing female and in no distress.  HENT:  Head: Normocephalic and atraumatic.  Mouth/Throat: Oropharynx is clear and moist. No oropharyngeal exudate.  Eyes: Conjunctivae are normal. Right eye exhibits no discharge. Left eye exhibits no discharge. No scleral icterus.  Neck: Normal range of motion. Neck supple Cardiovascular: Normal rate, regular rhythm, normal heart sounds and intact distal pulses.   Pulmonary/Chest: Effort normal. Quiet  breath sounds bilaterally.  On supplemental oxygen.  No respiratory distress. No wheezes. No rales.  Abdominal: Soft. Bowel sounds are normal. Exhibits no distension and no mass. There is no tenderness.  Musculoskeletal: Normal range of motion. Exhibits no edema.  Lymphadenopathy:    No cervical adenopathy.  Neurological: Alert and oriented to person, place, and time. Exhibits muscle wasting.  Skin: Skin is warm and dry. Scab skin lesion on upper left leg with area of surrounding erythema. No rash noted. Not diaphoretic.  No pallor.  Psychiatric: Mood, memory and judgment normal.  Vitals reviewed.  LABORATORY DATA: Lab Results  Component Value Date   WBC 8.9 12/14/2023   HGB 11.0 (L) 12/14/2023   HCT 34.0 (L) 12/14/2023   MCV 95.5 12/14/2023   PLT 194 12/14/2023      Chemistry      Component Value Date/Time   NA 141 12/14/2023 1509   NA 143 01/10/2018 1629   K 4.5 12/14/2023 1509   CL 107 12/14/2023 1509   CO2 28 12/14/2023 1509   BUN 22 12/14/2023 1509   BUN 14 01/10/2018 1629   CREATININE 1.44 (H) 12/14/2023 1509   CREATININE 0.68 08/05/2015 1456      Component Value Date/Time   CALCIUM  9.4 12/14/2023 1509   ALKPHOS 84 12/14/2023 1509   AST 22 12/14/2023 1509   ALT 25 12/14/2023 1509   BILITOT 0.4 12/14/2023 1509       RADIOGRAPHIC STUDIES:  No results found.   ASSESSMENT/PLAN:  This is a very pleasant 69 year old Caucasian female diagnosed with stage IV non-small cell lung cancer, adenocarcinoma. She is negative for any actionable mutations and her PD-L1 expression is 1%. She was diagnosed in December 2019. She presented with bilateral pulmonary nodules.    She initially started treatment with carboplatin , Alimta , and Keytruda . She was status post 4 cycles but Keytruda  was discontinued after cycle #2 due to his significant skin rash. She then was treated with maintenance single agent Alimta  for 3 cycles. This has been on hold since June 2020 due to worsening  renal insufficiency.   She has been on observation since that time  and feeling fairly well except she does have significant COPD for which she is on supplemental oxygen. She follows closely with pulmonology.    She had a PET scan which showed evidence of disease progression with multiple bilateral solid and subsolid nodules.   Due to the patient's history of intolerance to chemotherapy she was started on second line targeted treatment with Krazati  600 mg twice daily.  Her first dose was on 02/20/22. Dose was reduced to 400 mg twice daily around 03/03/2022 due to ongoing symptoms of nausea, vomiting, and diarrhea. She resumed this on 05/07/22.   Labs were reviewed. Recommend that she continue on the same treatment at the same dose.    Chronic itching and dry skin -Generalized itching and dry skin.  - Take antihistamines like Claritin  or Zyrtec for generalized itching. - She does have elevated eosinophils. She is going to talk to her PCP soon to get their input -Use air purifiers at home. - Apply cortisone cream to affected areas.  Iron deficiency anemia Mild anemia noted. Plans to restart iron supplementation. - Restart iron supplements as tolerated. - Monitor hemoglobin levels.  Coronary artery disease with prior stents Coronary artery disease with prior stents. Shoulder pain similar to previous angina. Nitro tablets cause migraines. She is not having symptoms at this time.  - Call cardiologist to schedule an appointment. She states she will call tomorrow.  - Of course, if she ever has symptoms of MI she knows to seek ER evaluation.   I will arrange for a restaging CT of the CAP prior to her next visit. I ordered wo contrast due to her borderline creatinine. We will see her for follow up and to review her scan results in 6 weeks  I sent her doxycycline  for the skin lesion which may have mild infection around it due to erythema. I advised her to avoid picking this area which can cause  introduction of bacteria under the skin. She has a lot of antibiotic allergies but is able to tolerate iron.   Chronic hypoxemic respiratory failure Chronic hypoxemic respiratory failure managed with home oxygen therapy. Oxygen levels fluctuate, especially at night. - Continue home oxygen therapy at 3-4 L/min. - She will continue to follow with pulmonary medicine. She is compliant with inhalers.  - She is expected to have PFTs soon.     Use Zofran  sublingual as needed for nausea.  I also gave the patient information for the lung cancer support groups. She is not able to    The patient was advised to call immediately if she has any concerning symptoms in the interval. The patient voices understanding of current disease status and treatment options and is in agreement with the current care plan. All questions were answered. The patient knows to call the clinic with any problems, questions or concerns. We can certainly see the patient much sooner if necessary        Orders Placed This Encounter  Procedures   CT CHEST ABDOMEN PELVIS WO CONTRAST    Standing Status:   Future    Expected Date:   01/20/2024    Expiration Date:   12/13/2024    Preferred imaging location?:   [facility]    If indicated for the ordered procedure, I authorize the administration of oral contrast media per Radiology protocol:   No    Reason for no oral contrast::   borderline CKD     The total time spent in the appointment was 30-39 minutes  Corianna Avallone L Juwan Vences, PA-C 12/14/23

## 2023-12-14 ENCOUNTER — Inpatient Hospital Stay: Attending: Physician Assistant

## 2023-12-14 ENCOUNTER — Inpatient Hospital Stay: Admitting: Physician Assistant

## 2023-12-14 VITALS — BP 162/88 | HR 98 | Temp 98.1°F | Resp 20 | Wt 181.8 lb

## 2023-12-14 DIAGNOSIS — N289 Disorder of kidney and ureter, unspecified: Secondary | ICD-10-CM | POA: Diagnosis not present

## 2023-12-14 DIAGNOSIS — J9611 Chronic respiratory failure with hypoxia: Secondary | ICD-10-CM | POA: Insufficient documentation

## 2023-12-14 DIAGNOSIS — L299 Pruritus, unspecified: Secondary | ICD-10-CM | POA: Diagnosis not present

## 2023-12-14 DIAGNOSIS — D509 Iron deficiency anemia, unspecified: Secondary | ICD-10-CM | POA: Diagnosis not present

## 2023-12-14 DIAGNOSIS — C3492 Malignant neoplasm of unspecified part of left bronchus or lung: Secondary | ICD-10-CM | POA: Insufficient documentation

## 2023-12-14 DIAGNOSIS — L089 Local infection of the skin and subcutaneous tissue, unspecified: Secondary | ICD-10-CM

## 2023-12-14 DIAGNOSIS — J449 Chronic obstructive pulmonary disease, unspecified: Secondary | ICD-10-CM | POA: Diagnosis not present

## 2023-12-14 DIAGNOSIS — Z9221 Personal history of antineoplastic chemotherapy: Secondary | ICD-10-CM | POA: Insufficient documentation

## 2023-12-14 DIAGNOSIS — C3491 Malignant neoplasm of unspecified part of right bronchus or lung: Secondary | ICD-10-CM | POA: Insufficient documentation

## 2023-12-14 DIAGNOSIS — Z9981 Dependence on supplemental oxygen: Secondary | ICD-10-CM | POA: Insufficient documentation

## 2023-12-14 DIAGNOSIS — I2510 Atherosclerotic heart disease of native coronary artery without angina pectoris: Secondary | ICD-10-CM | POA: Insufficient documentation

## 2023-12-14 LAB — CBC WITH DIFFERENTIAL (CANCER CENTER ONLY)
Abs Immature Granulocytes: 0.05 K/uL (ref 0.00–0.07)
Basophils Absolute: 0.1 K/uL (ref 0.0–0.1)
Basophils Relative: 1 %
Eosinophils Absolute: 0.9 K/uL — ABNORMAL HIGH (ref 0.0–0.5)
Eosinophils Relative: 10 %
HCT: 34 % — ABNORMAL LOW (ref 36.0–46.0)
Hemoglobin: 11 g/dL — ABNORMAL LOW (ref 12.0–15.0)
Immature Granulocytes: 1 %
Lymphocytes Relative: 15 %
Lymphs Abs: 1.4 K/uL (ref 0.7–4.0)
MCH: 30.9 pg (ref 26.0–34.0)
MCHC: 32.4 g/dL (ref 30.0–36.0)
MCV: 95.5 fL (ref 80.0–100.0)
Monocytes Absolute: 0.6 K/uL (ref 0.1–1.0)
Monocytes Relative: 7 %
Neutro Abs: 5.8 K/uL (ref 1.7–7.7)
Neutrophils Relative %: 66 %
Platelet Count: 194 K/uL (ref 150–400)
RBC: 3.56 MIL/uL — ABNORMAL LOW (ref 3.87–5.11)
RDW: 13.7 % (ref 11.5–15.5)
WBC Count: 8.9 K/uL (ref 4.0–10.5)
nRBC: 0 % (ref 0.0–0.2)

## 2023-12-14 LAB — CMP (CANCER CENTER ONLY)
ALT: 25 U/L (ref 0–44)
AST: 22 U/L (ref 15–41)
Albumin: 4.1 g/dL (ref 3.5–5.0)
Alkaline Phosphatase: 84 U/L (ref 38–126)
Anion gap: 6 (ref 5–15)
BUN: 22 mg/dL (ref 8–23)
CO2: 28 mmol/L (ref 22–32)
Calcium: 9.4 mg/dL (ref 8.9–10.3)
Chloride: 107 mmol/L (ref 98–111)
Creatinine: 1.44 mg/dL — ABNORMAL HIGH (ref 0.44–1.00)
GFR, Estimated: 39 mL/min — ABNORMAL LOW (ref >60)
Glucose, Bld: 140 mg/dL — ABNORMAL HIGH (ref 70–99)
Potassium: 4.5 mmol/L (ref 3.5–5.1)
Sodium: 141 mmol/L (ref 135–145)
Total Bilirubin: 0.4 mg/dL (ref 0.0–1.2)
Total Protein: 6.3 g/dL — ABNORMAL LOW (ref 6.5–8.1)

## 2023-12-14 MED ORDER — DOXYCYCLINE HYCLATE 100 MG PO TABS: 100.0000 mg | ORAL_TABLET | Freq: Two times a day (BID) | ORAL | 0 refills | Status: DC

## 2023-12-16 ENCOUNTER — Other Ambulatory Visit: Payer: Self-pay

## 2023-12-22 ENCOUNTER — Other Ambulatory Visit (HOSPITAL_COMMUNITY): Payer: Self-pay

## 2023-12-22 ENCOUNTER — Encounter

## 2023-12-24 ENCOUNTER — Other Ambulatory Visit (HOSPITAL_COMMUNITY): Payer: Self-pay

## 2023-12-31 ENCOUNTER — Other Ambulatory Visit (HOSPITAL_COMMUNITY): Payer: Self-pay

## 2023-12-31 ENCOUNTER — Other Ambulatory Visit: Payer: Self-pay

## 2023-12-31 NOTE — Progress Notes (Signed)
 Specialty Pharmacy Refill Coordination Note  [name] is a 68 y.o. female contacted today regarding refills of specialty medication(s) Adagrasib  (Krazati )   Patient requested Delivery   Delivery date: 01/04/24   Verified address: [address]   [name] [id number]   Medication will be filled on: 01/03/24

## 2024-01-03 ENCOUNTER — Other Ambulatory Visit: Payer: Self-pay

## 2024-01-12 NOTE — H&P (View-Only) (Signed)
 Cardiology Office Note    Date:  01/13/2024  ID:  [name], [name] [birth year], MRN [id number] PCP:  [name] HERO, NP  Cardiologist:  Diana Cash, MD  Electrophysiologist:  None   Chief Complaint: Chest pain   History of Present Illness: .   [name] is a 68 y.o. female with visit-pertinent history of asthma, depression, GERD, fibromyalgia, advanced lung cancer, PVCs, PACs, SVT and CAD.  LHC in 04/2008 demonstrating 80% stenosis in the mid RCA treated with a Xience DES.  Patient was noted to have possible SVT during cardiac rehab in 05/2008.  Relook LHC in 05/2008 indicated proximal RCA 25%, mid RCA stent patent.  Holter monitor in 03/2009 indicated normal sinus rhythm with PACs and PVCs.  Stress Myoview  in 10/2012 showed a reversible basal inferior perfusion defect, described class III angina on her follow-up visit.  Cardiac cath on 12/06/2012 with severe stenosis in the mid RCA just before the old stent which also had a restenosis treated entire segment with a 2.75 x 28 mm Promus Premier DES.  Following patient had recurrent chest pain and was seen in the office on 05/05/2013.  Cardiac cath on 05/08/2013 with severe stenosis of the mid LAD, a 2.5 x 16 mm Promus DES was deployed in the mid LAD.  She also had complaints of palpitations and was started on Lopressor  12.5 mg twice daily.  Cardiac cath in June 2017 with patent stents in the LAD and RCA.  LVEF was normal.  She underwent a nuclear stress test in 2018 in setting of exertional dyspnea that showed no evidence of ischemia or prior infarction.  EF was noted be mildly decreased at 48%.  Echo in August 2018 showed normal LVEF at 55 to 60%, G1 DD was noted, wall motion was normal.  No significant valvular disease was noted.  In December 2019 she was diagnosed with small cell lung cancer and had undergone chemotherapy.  Nuclear stress test in May 2022 with no ischemia.  Cardiac monitor in June 2022 with sinus with 2 short runs of SVT,  longest 8 beats, echo in June 2022 with LVEF 55 to 60%.  Cardiac MRI in July 2022 with large epicardial adipose layer, LVEF 56%.  Basal inferior septal scar.  Patient was last seen in clinic by cardiology on 07/21/2021 by Dr. Cash.  She remained stable from a cardiac standpoint.  Today she presents for overdue follow-up.  She reports that she has been experiencing increased episodes of chest discomfort in recent months, notes will have episodes daily with exertion. Patient notes she has been having worsening shortness of breath thought to be related to COPD however feels this has been worse recently associated with her chest pain. She notes her pain is similar to her prior angina when she needed stenting, she has not taken nitroglycerin  as this results in migraines for 24 hours, takes aspirin  for chest pain, typically resolves after 20 mins.  She reports that up until last week she would awaken at night with episodes of chest pain, denied any significant palpitations. Patient is on 3-4L [place]. With increased exertion her heart rate increases to 150 bpm, she reports that last week at night her pulse ox indicated that her heart rate had dropped into the 30s and 40s, she was unaware of any significant palpitations at the time.. She denies any increased lower extremity edema, orthopnea or pnd.  ROS: .   Today she denies lower extremity edema, fatigue, melena, hematuria, hemoptysis, diaphoresis,  presyncope, syncope, orthopnea, and PND.  All other systems are reviewed and otherwise negative. Studies Reviewed: [name]   EKG:  EKG is ordered today, personally reviewed, demonstrating  EKG Interpretation Date/Time:  Thursday January 13 2024 15:40:18 EST Ventricular Rate:  99 PR Interval:  146 QRS Duration:  68 QT Interval:  336 QTC Calculation: 431 R Axis:   72  Text Interpretation: Normal sinus rhythm Normal ECG Confirmed by [name] [phone]) on 01/13/2024 6:29:21 PM   CV Studies: Cardiac studies reviewed  are outlined and summarized above. Otherwise please see EMR for full report. Cardiac Studies & Procedures   ______________________________________________________________________________________________ CARDIAC CATHETERIZATION  CARDIAC CATHETERIZATION 08/07/2015  Conclusion  Mid RCA lesion, 20% stenosed. The lesion was previously treated with a stent (unknown type).  Ost RPDA lesion, 50% stenosed.  The left ventricular systolic function is normal.  Prox LAD to Mid LAD lesion, 10% stenosed. The lesion was previously treated with a stent (unknown type).  1. Double vessel coronary artery disease with continued patency of the stented segments in the LAD and RCA 2. Preserved LV systolic function  Findings Coronary Findings Diagnostic  Dominance: Right  Left Anterior Descending The lesion was previously treated with a stent (unknown type). Minimal restenosis, widely patent  Left Circumflex The vessel exhibits minimal luminal irregularities.  Right Coronary Artery The lesion was previously treated with a stent (unknown type).  Right Posterior Descending Artery  Intervention  No interventions have been documented.   STRESS TESTS  MYOCARDIAL PERFUSION IMAGING 07/12/2020  Interpretation Summary  The left ventricular ejection fraction is normal (55-65%).  Nuclear stress EF: 57%.  No T wave inversion was noted during stress.  There was no ST segment deviation noted during stress.  This is a low risk study.  No reversible ischemia. LVEF 57% with normal wall motion. This is a low risk study. No prior for comparison.   ECHOCARDIOGRAM  ECHOCARDIOGRAM LIMITED 08/14/2020  Narrative ECHOCARDIOGRAM LIMITED REPORT    Patient Name:   [name] Date of Exam: 08/14/2020 Medical Rec #:  [id number]     Height:       67.0 in Accession #:    [id number]    Weight:       167.0 lb Date of Birth:  10-Aug-1955     BSA:          1.873 m Patient Age:    64 years      BP:            131/86 mmHg Patient Gender: F             HR:           75 bpm. Exam Location:  [address]  Procedure: Limited Echo, Cardiac Doppler, Limited Color Doppler and Strain Analysis  Indications:    I31.3 Pericardia Effusion  History:        Patient has prior history of Echocardiogram examinations, most recent 07/12/2020. CAD, Lung Cancer, Arrythmias:SVT and PVC; Risk Factors:HLD.  Sonographer:    Waldo Guadalajara RCS Referring Phys: [id number] CHRISTOPHER D MCALHANY  IMPRESSIONS   1. Limited echo for pericardial effusion 2. Left ventricular ejection fraction, by estimation, is 55 to 60%. The left ventricle has normal function. The left ventricle has no regional wall motion abnormalities. 3. There is normal pulmonary artery systolic pressure. 4. There is an anterior echo free space wtih mixed material, there appears to me to be an extracardiac echo free space, possiblyh fat pad and a smaller echo free space  adjacent to the the pericardium with mixed material suggestive of a small focal pericardial effusion. 5. Consider cardiac MRI if warranted to further evalute this finding.  Comparison(s): No significant change from prior study. 07/12/2020: LVEF 60-65%, moderate pericardial effusion. I compared the images directly and discussed with Dr. Raford and she agrees that there is no change in the size of the anterior structures.  FINDINGS Left Ventricle: Left ventricular ejection fraction, by estimation, is 55 to 60%. The left ventricle has normal function. The left ventricle has no regional wall motion abnormalities. There is no left ventricular hypertrophy.  Right Ventricle: There is normal pulmonary artery systolic pressure. The tricuspid regurgitant velocity is 1.67 m/s, and with an assumed right atrial pressure of 3 mmHg, the estimated right ventricular systolic pressure is 14.2 mmHg.  Pericardium: There is no evidence of pericardial effusion.  LEFT VENTRICLE PLAX 2D LVIDd:         4.00  cm LVIDs:         2.90 cm LV PW:         1.10 cm LV IVS:        0.90 cm LVOT diam:     2.10 cm LVOT Area:     3.46 cm   RIGHT VENTRICLE RVSP:           14.2 mmHg  LEFT ATRIUM         Index      RIGHT ATRIUM LA diam:    3.10 cm 1.66 cm/m RA Pressure: 3.00 mmHg MITRAL VALVE               TRICUSPID VALVE MV Area (PHT): 3.20 cm    TR Peak grad:   11.2 mmHg MV Decel Time: 237 msec    TR Vmax:        167.00 cm/s MV E velocity: 81.00 cm/s  Estimated RAP:  3.00 mmHg MV A velocity: 98.10 cm/s  RVSP:           14.2 mmHg MV E/A ratio:  0.83 SHUNTS Systemic Diam: 2.10 cm  Vinie Maxcy MD Electronically signed by Vinie Maxcy MD Signature Date/Time: 08/14/2020/3:25:02 PM    Final    MONITORS  LONG TERM MONITOR (3-14 DAYS) 08/13/2020  Narrative Patch Wear Time:  3 days and 1 hours (2022-06-04T11:46:22-0400 to 2022-06-07T13:24:22-0400)  Sinus rhythm. (Patient had a min HR of 62 bpm, max HR of 169 bpm, and avg HR of 86 bpm. 2 Supraventricular Tachycardia runs occurred, the run with the fastest interval lasting 8 beats with a max rate of 169 bpm (avg 160 bpm); the run with the fastest interval was also the longest. Rare premature atrial contractions and rare premature ventricular contractions.     CARDIAC MRI  MR CARDIAC MORPHOLOGY W WO CONTRAST 09/16/2020  Narrative CLINICAL DATA:  ?Pericardial effusion vs fat pad  COMPARISON: Echocardiogram 07/12/20, 08/14/20  EXAM: CARDIAC MRI  TECHNIQUE: The patient was scanned on a 1.5 Tesla GE magnet. A dedicated cardiac coil was used. Functional imaging was done using [employer] sequences. 2,3, and 4 chamber views were done to assess for RWMA's. Modified Simpson's rule using a short axis stack was used to calculate an ejection fraction on a dedicated work [employer]. The patient received 10mL GADAVIST  GADOBUTROL  1 MMOL/ML IV SOLN. After 10 minutes inversion recovery sequences were used to assess for infiltration  and scar tissue.  This examination is tailored for evaluation cardiac anatomy and function and provides very limited assessment of noncardiac structures, which are  accordingly not evaluated during interpretation. If there is clinical concern for extracardiac pathology, further evaluation with CT imaging should be considered.  FINDINGS: LEFT VENTRICLE:  Normal left ventricular chamber size.  Borderline left ventricular wall thickness, 10 mm maximal wall thickness.  Normal left ventricular systolic function.  LVEF = 56%  Mild hypokinesis of the inferior wall.  No myocardial edema, T2 = 48 msec.  Normal first pass perfusion.  There is post contrast delayed myocardial enhancement. Delayed myocardial enhancement in the subendocardium and midmyocardium of the basal inferoseptal and inferior wall, as well as the mid and apical inferior wall subendocardium and mid-myocardium. These areas of delayed enhancement are >50% of the myocardial thickness and are nearly transmural, consistent with prior infarct with no viability.  Normal T1 myocardial nulling kinetics suggest against a diagnosis of cardiac amyloidosis.  ECV = 27%, normal range.  RIGHT VENTRICLE:  Normal right ventricular chamber size.  Normal right ventricular wall thickness.  Normal right ventricular systolic function.  RVEF = 61%  There are no regional wall motion abnormalities.  No post contrast delayed myocardial enhancement.  ATRIA:  Normal left atrial size.  Normal right atrial size.  VALVES:  No significant valvular abnormalities.  Tricuspid aortic valve.  PERICARDIUM:  Normal pericardium.  No pericardial effusion.  There is a large epicardial fat layer.  OTHER: No significant extracardiac findings.  MEASUREMENTS: Qp/Qs 1.5  Left ventricle:  LV Female  LV EF: 56% (Normal 56-78%)  Absolute volumes:  LV EDV: 63mL (Normal 52-141 mL)  LV ESV: 27mL (Normal 13-51 mL)  LV SV: 35mL  (Normal 33-97 mL)  CO: 2.8L/min (Normal 2.7-6.0 L/min)  Indexed volumes:  LV EDV: 54mL/sq-m (Normal 41-81 mL/sq-m)  LV ESV: 76mL/sq-m (Normal 12-21 mL/sq-m)  LV SV: 68mL/sq-m (Normal 26-56 mL/sq-m)  CI: 1.5L/min/sq-m (Normal 1.8-3.8 L/min/sq-m), query accuracy due to small cavity size and slice selection.  Right ventricle:  RV female  RV EF: 61% (Normal 47-80%)  Absolute volumes:  RV EDV: 47 mL (Normal 58-154 mL)  RV ESV: 18 mL (Normal 12-68 mL)  RV SV: 28 mL (Normal 35-98 mL)  CO: 2.3 L/min (Normal 2.7-6 L/min)  Indexed volumes:  RV EDV: 25 ML/sq-m (Normal 48-87 mL/sq-m)  RV ESV: 10 mL/sq-m (Normal 11-28 mL/sq-m)  RV SV: 15 mL/sq-m (Normal 27-57 mL/sq-m)  CI: 1.2 L/min/sq-m (Normal 1.8-3.8 L/min/sq-m)  IMPRESSION: 1.  Large epicardial adipose layer.  No pericardial effusion.  2. Normal left ventricular chamber size with normal systolic function, LVEF 56%. Mild inferior wall hypokinesis from base to apex. Borderline increased wall thickness.  3. Near transmural delayed myocardial enhancement in the basal inferoseptum and inferior wall from base to apex, consistent with prior infarct with no viability in this region.  4.  Normal right ventricular chamber size and function.  RVEF 61%.  5. Calculated Qp/Qs 1.5, however right heart size is normal. No definite shunts identified on this study.   Electronically Signed By: Gayatri  Acharya On: 09/20/2020 16:45   ______________________________________________________________________________________________       Current Reported Medications:.    Current Meds  Medication Sig   adagrasib  (KRAZATI ) 200 MG tablet Take 2 tablets (400 mg total) by mouth 2 (two) times daily.   albuterol  (VENTOLIN  HFA) 108 (90 Base) MCG/ACT inhaler Inhale 1 puff into the lungs every 6 (six) hours as needed (wheezing/shortness of breath.).   aspirin  81 MG chewable tablet Chew 1 tablet (81 mg total) by mouth daily.   BELSOMRA  20  MG TABS Take 1 tablet by mouth  at bedtime as needed.   budesonide-glycopyrrolate-formoterol  (BREZTRI  AEROSPHERE) 160-9-4.8 MCG/ACT AERO inhaler Inhale 2 puffs into the lungs in the morning and at bedtime.   buPROPion  (WELLBUTRIN  XL) 150 MG 24 hr tablet Take 1-2 tablets by mouth 2 (two) times daily. Patient takes 2 tablets (300mg ) in the morning and 1 tablet (150mg ) in the evening   doxycycline  (VIBRA -TABS) 100 MG tablet Take 1 tablet (100 mg total) by mouth 2 (two) times daily.   doxycycline  (VIBRA -TABS) 100 MG tablet Take 1 tablet (100 mg total) by mouth 2 (two) times daily.   ferrous sulfate  (FEROSUL) 325 (65 FE) MG tablet TAKE 1 TABLET(325 MG) BY MOUTH DAILY WITH BREAKFAST   gabapentin  (NEURONTIN ) 100 MG capsule TAKE 1 CAPSULE(100 MG) BY MOUTH THREE TIMES DAILY (Patient taking differently: Take 100 mg by mouth as needed.)   HYDROcodone -acetaminophen  (NORCO) 7.5-325 MG tablet Take 1 tablet by mouth every 6 (six) hours as needed.   ipratropium-albuterol  (DUONEB) 0.5-2.5 (3) MG/3ML SOLN Inhale 3 mLs into the lungs every 6 (six) hours as needed (Asthma).   levothyroxine  (SYNTHROID ) 50 MCG tablet Take 50 mcg by mouth every morning.   loperamide  (IMODIUM  A-D) 2 MG tablet Take 1 tablet (2 mg total) by mouth 4 (four) times daily as needed for diarrhea or loose stools.   LORazepam  (ATIVAN ) 2 MG tablet Take 2 mg by mouth every evening.   metoprolol  tartrate (LOPRESSOR ) 25 MG tablet Take 0.5 tablets (12.5 mg total) by mouth 2 (two) times daily.   montelukast  (SINGULAIR ) 10 MG tablet Take 1 tablet (10 mg total) by mouth at bedtime.   nitroGLYCERIN  (NITROSTAT ) 0.4 MG SL tablet Place 1 tablet (0.4 mg total) under the tongue every 5 (five) minutes as needed for chest pain.   ondansetron  (ZOFRAN -ODT) 8 MG disintegrating tablet Take 1 tablet (8 mg total) by mouth every 8 (eight) hours as needed for nausea or vomiting.    Physical Exam:    VS:  BP 126/76   Pulse 99   Ht 5' 7 (1.702 m)   Wt 184 lb (83.5 kg)    SpO2 95%   BMI 28.82 kg/m    Wt Readings from Last 3 Encounters:  01/13/24 184 lb (83.5 kg)  12/14/23 181 lb 12.8 oz (82.5 kg)  12/07/23 175 lb (79.4 kg)    GEN: Well nourished, well developed in no acute distress NECK: No JVD; No carotid bruits CARDIAC: RRR, no murmurs, rubs, gallops RESPIRATORY:  Clear to auscultation without rales, wheezing or rhonchi  ABDOMEN: Soft, non-tender, non-distended EXTREMITIES:  No edema; No acute deformity     Asessement and Plan:.    CAD: LHC in 04/2008 demonstrating 80% stenosis in the mid RCA treated with a Xience DES.  Cardiac cath on 12/06/2012 with severe stenosis in the mid RCA just before the old stent which also had a restenosis treated entire segment with a 2.75 x 28 mm Promus Premier DES. In 2015 LHC with severe stenosis of the mid LAD, a 2.5 x 16 mm Promus DES was deployed in the mid LAD. Last cardiac cath in June 2017 with patent stents in the LAD and RCA.  Today patient reports episodes of progressive angina in recent weeks.  She notes that she will have chest discomfort on exertion daily associated with worsening shortness of breath and tachycardia.  Patient reports that up until the last week she was awakening at night with chest discomfort, would resolve with taking of aspirin , has not occurred in the last week.  Patient  declines use of nitroglycerin  noting it gives her significant migraines.  Discussed with Dr. Mona, DOD today, he recommended proceeding with cardiac catheterization given progressive anginal symptoms.  Patient in agreement with plan, please see consent below. Check CBC and CMET.  Patient with history of fluctuating renal function, eGFR earlier in the year 24, most recent 70, she will present for prehydration prior to catheterization.  Reviewed ED precautions.  Continue aspirin  81 mg daily, metoprolol  tartrate 25 mg twice daily.  Informed Consent   Shared Decision Making/Informed Consent The risks [stroke (1 in 1000), death (1 in  1000), kidney failure [usually temporary] (1 in 500), bleeding (1 in 200), allergic reaction [possibly serious] (1 in 200)], benefits (diagnostic support and management of coronary artery disease) and alternatives of a cardiac catheterization were discussed in detail with Ms. Waide and she is willing to proceed.      Palpitations/PAC/PVC: Patient with history of intermittent palpitations, prior monitors indicating sinus tachycardia and PACs and PVCs. Today she reports elevations in heart rate when up and walking, increasing into the 150s.  However she also reports that last week she had heart rates that dropped into the 30s and 40s at night, reported feeling off.  Of note this was on patient's pulse ox, she does has have history of PVCs.  Discussed with Dr. Mona if LHC unrevealing we will plan for cardiac monitor, given prior bradycardia will not increase beta-blocker at this time.  Reviewed ED precautions.  Hyperlipidemia: Last lipid profile in 2019 with LDL uncontrolled.  Patient has previously been unable to tolerate statin medications in setting of myalgias and elevated liver enzymes.  Check fasting lipid profile and LFTs, will refer to Pharm.D. lipid clinic.  Lung cancer: Patient diagnosed with stage IV non-small cell lung cancer, adenocarcinoma diagnosed in December 2019.  Initially on treatment of carboplatin , Alimta , and Keytruda .  Keytruda  discontinued after 2 cycles due to allergy , Alimta  discontinued due to worsening renal insufficiency.  Patient has been continued on Krazati  600 mg twice daily, has been stable in recent years.   COPD: Patient with severe COPD, followed by pulmonology.  PFTs currently pending.    Disposition: F/u with Donovon Micheletti, NP two weeks post cath.   Signed, Alvine Mostafa D Dametra Whetsel, NP

## 2024-01-12 NOTE — Progress Notes (Unsigned)
 Cardiology Office Note    Date:  01/13/2024  ID:  Myron, [name] [birth year], MRN [id number] PCP:  [name], NP  Cardiologist:  Lonni Cash, MD  Electrophysiologist:  None   Chief Complaint: Chest pain   History of Present Illness: .   [name] is a 68 y.o. female with visit-pertinent history of asthma, depression, GERD, fibromyalgia, advanced lung cancer, PVCs, PACs, SVT and CAD.  LHC in 04/2008 demonstrating 80% stenosis in the mid RCA treated with a Xience DES.  Patient was noted to have possible SVT during cardiac rehab in 05/2008.  Relook LHC in 05/2008 indicated proximal RCA 25%, mid RCA stent patent.  Holter monitor in 03/2009 indicated normal sinus rhythm with PACs and PVCs.  Stress Myoview  in 10/2012 showed a reversible basal inferior perfusion defect, described class III angina on her follow-up visit.  Cardiac cath on 12/06/2012 with severe stenosis in the mid RCA just before the old stent which also had a restenosis treated entire segment with a 2.75 x 28 mm Promus Premier DES.  Following patient had recurrent chest pain and was seen in the office on 05/05/2013.  Cardiac cath on 05/08/2013 with severe stenosis of the mid LAD, a 2.5 x 16 mm Promus DES was deployed in the mid LAD.  She also had complaints of palpitations and was started on Lopressor  12.5 mg twice daily.  Cardiac cath in June 2017 with patent stents in the LAD and RCA.  LVEF was normal.  She underwent a nuclear stress test in 2018 in setting of exertional dyspnea that showed no evidence of ischemia or prior infarction.  EF was noted be mildly decreased at 48%.  Echo in August 2018 showed normal LVEF at 55 to 60%, G1 DD was noted, wall motion was normal.  No significant valvular disease was noted.  In December 2019 she was diagnosed with small cell lung cancer and had undergone chemotherapy.  Nuclear stress test in May 2022 with no ischemia.  Cardiac monitor in June 2022 with sinus with 2 short runs of SVT,  longest 8 beats, echo in June 2022 with LVEF 55 to 60%.  Cardiac MRI in July 2022 with large epicardial adipose layer, LVEF 56%.  Basal inferior septal scar.  Patient was last seen in clinic by cardiology on 07/21/2021 by Dr. Cash.  She remained stable from a cardiac standpoint.  Today she presents for overdue follow-up.  She reports that she has been experiencing increased episodes of chest discomfort in recent months, notes will have episodes daily with exertion. Patient notes she has been having worsening shortness of breath thought to be related to COPD however feels this has been worse recently associated with her chest pain. She notes her pain is similar to her prior angina when she needed stenting, she has not taken nitroglycerin  as this results in migraines for 24 hours, takes aspirin  for chest pain, typically resolves after 20 mins.  She reports that up until last week she would awaken at night with episodes of chest pain, denied any significant palpitations. Patient is on 3-4L [place]. With increased exertion her heart rate increases to 150 bpm, she reports that last week at night her pulse ox indicated that her heart rate had dropped into the 30s and 40s, she was unaware of any significant palpitations at the time.. She denies any increased lower extremity edema, orthopnea or pnd.  ROS: .   Today she denies lower extremity edema, fatigue, melena, hematuria, hemoptysis, diaphoresis,  presyncope, syncope, orthopnea, and PND.  All other systems are reviewed and otherwise negative. Studies Reviewed: SABRA   EKG:  EKG is ordered today, personally reviewed, demonstrating  EKG Interpretation Date/Time:  Thursday January 13 2024 15:40:18 EST Ventricular Rate:  99 PR Interval:  146 QRS Duration:  68 QT Interval:  336 QTC Calculation: 431 R Axis:   72  Text Interpretation: Normal sinus rhythm Normal ECG Confirmed by Nanetta Wiegman [phone]) on 01/13/2024 6:29:21 PM   CV Studies: Cardiac studies reviewed  are outlined and summarized above. Otherwise please see EMR for full report. Cardiac Studies & Procedures   ______________________________________________________________________________________________ CARDIAC CATHETERIZATION  CARDIAC CATHETERIZATION 08/07/2015  Conclusion  Mid RCA lesion, 20% stenosed. The lesion was previously treated with a stent (unknown type).  Ost RPDA lesion, 50% stenosed.  The left ventricular systolic function is normal.  Prox LAD to Mid LAD lesion, 10% stenosed. The lesion was previously treated with a stent (unknown type).  1. Double vessel coronary artery disease with continued patency of the stented segments in the LAD and RCA 2. Preserved LV systolic function  Findings Coronary Findings Diagnostic  Dominance: Right  Left Anterior Descending The lesion was previously treated with a stent (unknown type). Minimal restenosis, widely patent  Left Circumflex The vessel exhibits minimal luminal irregularities.  Right Coronary Artery The lesion was previously treated with a stent (unknown type).  Right Posterior Descending Artery  Intervention  No interventions have been documented.   STRESS TESTS  MYOCARDIAL PERFUSION IMAGING 07/12/2020  Interpretation Summary  The left ventricular ejection fraction is normal (55-65%).  Nuclear stress EF: 57%.  No T wave inversion was noted during stress.  There was no ST segment deviation noted during stress.  This is a low risk study.  No reversible ischemia. LVEF 57% with normal wall motion. This is a low risk study. No prior for comparison.   ECHOCARDIOGRAM  ECHOCARDIOGRAM LIMITED 08/14/2020  Narrative ECHOCARDIOGRAM LIMITED REPORT    Patient Name:   [name] Date of Exam: 08/14/2020 Medical Rec #:  [id number]     Height:       67.0 in Accession #:    [id number]    Weight:       167.0 lb Date of Birth:  10-Aug-1955     BSA:          1.873 m Patient Age:    64 years      BP:            131/86 mmHg Patient Gender: F             HR:           75 bpm. Exam Location:  [address]  Procedure: Limited Echo, Cardiac Doppler, Limited Color Doppler and Strain Analysis  Indications:    I31.3 Pericardia Effusion  History:        Patient has prior history of Echocardiogram examinations, most recent 07/12/2020. CAD, Lung Cancer, Arrythmias:SVT and PVC; Risk Factors:HLD.  Sonographer:    Waldo Guadalajara RCS Referring Phys: [id number] CHRISTOPHER D MCALHANY  IMPRESSIONS   1. Limited echo for pericardial effusion 2. Left ventricular ejection fraction, by estimation, is 55 to 60%. The left ventricle has normal function. The left ventricle has no regional wall motion abnormalities. 3. There is normal pulmonary artery systolic pressure. 4. There is an anterior echo free space wtih mixed material, there appears to me to be an extracardiac echo free space, possiblyh fat pad and a smaller echo free space  adjacent to the the pericardium with mixed material suggestive of a small focal pericardial effusion. 5. Consider cardiac MRI if warranted to further evalute this finding.  Comparison(s): No significant change from prior study. 07/12/2020: LVEF 60-65%, moderate pericardial effusion. I compared the images directly and discussed with Dr. Raford and she agrees that there is no change in the size of the anterior structures.  FINDINGS Left Ventricle: Left ventricular ejection fraction, by estimation, is 55 to 60%. The left ventricle has normal function. The left ventricle has no regional wall motion abnormalities. There is no left ventricular hypertrophy.  Right Ventricle: There is normal pulmonary artery systolic pressure. The tricuspid regurgitant velocity is 1.67 m/s, and with an assumed right atrial pressure of 3 mmHg, the estimated right ventricular systolic pressure is 14.2 mmHg.  Pericardium: There is no evidence of pericardial effusion.  LEFT VENTRICLE PLAX 2D LVIDd:         4.00  cm LVIDs:         2.90 cm LV PW:         1.10 cm LV IVS:        0.90 cm LVOT diam:     2.10 cm LVOT Area:     3.46 cm   RIGHT VENTRICLE RVSP:           14.2 mmHg  LEFT ATRIUM         Index      RIGHT ATRIUM LA diam:    3.10 cm 1.66 cm/m RA Pressure: 3.00 mmHg MITRAL VALVE               TRICUSPID VALVE MV Area (PHT): 3.20 cm    TR Peak grad:   11.2 mmHg MV Decel Time: 237 msec    TR Vmax:        167.00 cm/s MV E velocity: 81.00 cm/s  Estimated RAP:  3.00 mmHg MV A velocity: 98.10 cm/s  RVSP:           14.2 mmHg MV E/A ratio:  0.83 SHUNTS Systemic Diam: 2.10 cm  Vinie Maxcy MD Electronically signed by Vinie Maxcy MD Signature Date/Time: 08/14/2020/3:25:02 PM    Final    MONITORS  LONG TERM MONITOR (3-14 DAYS) 08/13/2020  Narrative Patch Wear Time:  3 days and 1 hours (2022-06-04T11:46:22-0400 to 2022-06-07T13:24:22-0400)  Sinus rhythm. (Patient had a min HR of 62 bpm, max HR of 169 bpm, and avg HR of 86 bpm. 2 Supraventricular Tachycardia runs occurred, the run with the fastest interval lasting 8 beats with a max rate of 169 bpm (avg 160 bpm); the run with the fastest interval was also the longest. Rare premature atrial contractions and rare premature ventricular contractions.     CARDIAC MRI  MR CARDIAC MORPHOLOGY W WO CONTRAST 09/16/2020  Narrative CLINICAL DATA:  ?Pericardial effusion vs fat pad  COMPARISON: Echocardiogram 07/12/20, 08/14/20  EXAM: CARDIAC MRI  TECHNIQUE: The patient was scanned on a 1.5 Tesla GE magnet. A dedicated cardiac coil was used. Functional imaging was done using Fiesta sequences. 2,3, and 4 chamber views were done to assess for RWMA's. Modified Simpson's rule using a short axis stack was used to calculate an ejection fraction on a dedicated work [employer]. The patient received 10mL GADAVIST  GADOBUTROL  1 MMOL/ML IV SOLN. After 10 minutes inversion recovery sequences were used to assess for infiltration  and scar tissue.  This examination is tailored for evaluation cardiac anatomy and function and provides very limited assessment of noncardiac structures, which are  accordingly not evaluated during interpretation. If there is clinical concern for extracardiac pathology, further evaluation with CT imaging should be considered.  FINDINGS: LEFT VENTRICLE:  Normal left ventricular chamber size.  Borderline left ventricular wall thickness, 10 mm maximal wall thickness.  Normal left ventricular systolic function.  LVEF = 56%  Mild hypokinesis of the inferior wall.  No myocardial edema, T2 = 48 msec.  Normal first pass perfusion.  There is post contrast delayed myocardial enhancement. Delayed myocardial enhancement in the subendocardium and midmyocardium of the basal inferoseptal and inferior wall, as well as the mid and apical inferior wall subendocardium and mid-myocardium. These areas of delayed enhancement are >50% of the myocardial thickness and are nearly transmural, consistent with prior infarct with no viability.  Normal T1 myocardial nulling kinetics suggest against a diagnosis of cardiac amyloidosis.  ECV = 27%, normal range.  RIGHT VENTRICLE:  Normal right ventricular chamber size.  Normal right ventricular wall thickness.  Normal right ventricular systolic function.  RVEF = 61%  There are no regional wall motion abnormalities.  No post contrast delayed myocardial enhancement.  ATRIA:  Normal left atrial size.  Normal right atrial size.  VALVES:  No significant valvular abnormalities.  Tricuspid aortic valve.  PERICARDIUM:  Normal pericardium.  No pericardial effusion.  There is a large epicardial fat layer.  OTHER: No significant extracardiac findings.  MEASUREMENTS: Qp/Qs 1.5  Left ventricle:  LV Female  LV EF: 56% (Normal 56-78%)  Absolute volumes:  LV EDV: 63mL (Normal 52-141 mL)  LV ESV: 27mL (Normal 13-51 mL)  LV SV: 35mL  (Normal 33-97 mL)  CO: 2.8L/min (Normal 2.7-6.0 L/min)  Indexed volumes:  LV EDV: 54mL/sq-m (Normal 41-81 mL/sq-m)  LV ESV: 76mL/sq-m (Normal 12-21 mL/sq-m)  LV SV: 68mL/sq-m (Normal 26-56 mL/sq-m)  CI: 1.5L/min/sq-m (Normal 1.8-3.8 L/min/sq-m), query accuracy due to small cavity size and slice selection.  Right ventricle:  RV female  RV EF: 61% (Normal 47-80%)  Absolute volumes:  RV EDV: 47 mL (Normal 58-154 mL)  RV ESV: 18 mL (Normal 12-68 mL)  RV SV: 28 mL (Normal 35-98 mL)  CO: 2.3 L/min (Normal 2.7-6 L/min)  Indexed volumes:  RV EDV: 25 ML/sq-m (Normal 48-87 mL/sq-m)  RV ESV: 10 mL/sq-m (Normal 11-28 mL/sq-m)  RV SV: 15 mL/sq-m (Normal 27-57 mL/sq-m)  CI: 1.2 L/min/sq-m (Normal 1.8-3.8 L/min/sq-m)  IMPRESSION: 1.  Large epicardial adipose layer.  No pericardial effusion.  2. Normal left ventricular chamber size with normal systolic function, LVEF 56%. Mild inferior wall hypokinesis from base to apex. Borderline increased wall thickness.  3. Near transmural delayed myocardial enhancement in the basal inferoseptum and inferior wall from base to apex, consistent with prior infarct with no viability in this region.  4.  Normal right ventricular chamber size and function.  RVEF 61%.  5. Calculated Qp/Qs 1.5, however right heart size is normal. No definite shunts identified on this study.   Electronically Signed By: Gayatri  Acharya On: 09/20/2020 16:45   ______________________________________________________________________________________________       Current Reported Medications:.    Current Meds  Medication Sig   adagrasib  (KRAZATI ) 200 MG tablet Take 2 tablets (400 mg total) by mouth 2 (two) times daily.   albuterol  (VENTOLIN  HFA) 108 (90 Base) MCG/ACT inhaler Inhale 1 puff into the lungs every 6 (six) hours as needed (wheezing/shortness of breath.).   aspirin  81 MG chewable tablet Chew 1 tablet (81 mg total) by mouth daily.   BELSOMRA  20  MG TABS Take 1 tablet by mouth  at bedtime as needed.   budesonide-glycopyrrolate-formoterol  (BREZTRI  AEROSPHERE) 160-9-4.8 MCG/ACT AERO inhaler Inhale 2 puffs into the lungs in the morning and at bedtime.   buPROPion  (WELLBUTRIN  XL) 150 MG 24 hr tablet Take 1-2 tablets by mouth 2 (two) times daily. Patient takes 2 tablets (300mg ) in the morning and 1 tablet (150mg ) in the evening   doxycycline  (VIBRA -TABS) 100 MG tablet Take 1 tablet (100 mg total) by mouth 2 (two) times daily.   doxycycline  (VIBRA -TABS) 100 MG tablet Take 1 tablet (100 mg total) by mouth 2 (two) times daily.   ferrous sulfate  (FEROSUL) 325 (65 FE) MG tablet TAKE 1 TABLET(325 MG) BY MOUTH DAILY WITH BREAKFAST   gabapentin  (NEURONTIN ) 100 MG capsule TAKE 1 CAPSULE(100 MG) BY MOUTH THREE TIMES DAILY (Patient taking differently: Take 100 mg by mouth as needed.)   HYDROcodone -acetaminophen  (NORCO) 7.5-325 MG tablet Take 1 tablet by mouth every 6 (six) hours as needed.   ipratropium-albuterol  (DUONEB) 0.5-2.5 (3) MG/3ML SOLN Inhale 3 mLs into the lungs every 6 (six) hours as needed (Asthma).   levothyroxine  (SYNTHROID ) 50 MCG tablet Take 50 mcg by mouth every morning.   loperamide  (IMODIUM  A-D) 2 MG tablet Take 1 tablet (2 mg total) by mouth 4 (four) times daily as needed for diarrhea or loose stools.   LORazepam  (ATIVAN ) 2 MG tablet Take 2 mg by mouth every evening.   metoprolol  tartrate (LOPRESSOR ) 25 MG tablet Take 0.5 tablets (12.5 mg total) by mouth 2 (two) times daily.   montelukast  (SINGULAIR ) 10 MG tablet Take 1 tablet (10 mg total) by mouth at bedtime.   nitroGLYCERIN  (NITROSTAT ) 0.4 MG SL tablet Place 1 tablet (0.4 mg total) under the tongue every 5 (five) minutes as needed for chest pain.   ondansetron  (ZOFRAN -ODT) 8 MG disintegrating tablet Take 1 tablet (8 mg total) by mouth every 8 (eight) hours as needed for nausea or vomiting.    Physical Exam:    VS:  BP 126/76   Pulse 99   Ht 5' 7 (1.702 m)   Wt 184 lb (83.5 kg)    SpO2 95%   BMI 28.82 kg/m    Wt Readings from Last 3 Encounters:  01/13/24 184 lb (83.5 kg)  12/14/23 181 lb 12.8 oz (82.5 kg)  12/07/23 175 lb (79.4 kg)    GEN: Well nourished, well developed in no acute distress NECK: No JVD; No carotid bruits CARDIAC: RRR, no murmurs, rubs, gallops RESPIRATORY:  Clear to auscultation without rales, wheezing or rhonchi  ABDOMEN: Soft, non-tender, non-distended EXTREMITIES:  No edema; No acute deformity     Asessement and Plan:.    CAD: LHC in 04/2008 demonstrating 80% stenosis in the mid RCA treated with a Xience DES.  Cardiac cath on 12/06/2012 with severe stenosis in the mid RCA just before the old stent which also had a restenosis treated entire segment with a 2.75 x 28 mm Promus Premier DES. In 2015 LHC with severe stenosis of the mid LAD, a 2.5 x 16 mm Promus DES was deployed in the mid LAD. Last cardiac cath in June 2017 with patent stents in the LAD and RCA.  Today patient reports episodes of progressive angina in recent weeks.  She notes that she will have chest discomfort on exertion daily associated with worsening shortness of breath and tachycardia.  Patient reports that up until the last week she was awakening at night with chest discomfort, would resolve with taking of aspirin , has not occurred in the last week.  Patient  declines use of nitroglycerin  noting it gives her significant migraines.  Discussed with Dr. Mona, DOD today, he recommended proceeding with cardiac catheterization given progressive anginal symptoms.  Patient in agreement with plan, please see consent below. Check CBC and CMET.  Patient with history of fluctuating renal function, eGFR earlier in the year 24, most recent 70, she will present for prehydration prior to catheterization.  Reviewed ED precautions.  Continue aspirin  81 mg daily, metoprolol  tartrate 25 mg twice daily.  Informed Consent   Shared Decision Making/Informed Consent The risks [stroke (1 in 1000), death (1 in  1000), kidney failure [usually temporary] (1 in 500), bleeding (1 in 200), allergic reaction [possibly serious] (1 in 200)], benefits (diagnostic support and management of coronary artery disease) and alternatives of a cardiac catheterization were discussed in detail with Ms. Waide and she is willing to proceed.      Palpitations/PAC/PVC: Patient with history of intermittent palpitations, prior monitors indicating sinus tachycardia and PACs and PVCs. Today she reports elevations in heart rate when up and walking, increasing into the 150s.  However she also reports that last week she had heart rates that dropped into the 30s and 40s at night, reported feeling off.  Of note this was on patient's pulse ox, she does has have history of PVCs.  Discussed with Dr. Mona if LHC unrevealing we will plan for cardiac monitor, given prior bradycardia will not increase beta-blocker at this time.  Reviewed ED precautions.  Hyperlipidemia: Last lipid profile in 2019 with LDL uncontrolled.  Patient has previously been unable to tolerate statin medications in setting of myalgias and elevated liver enzymes.  Check fasting lipid profile and LFTs, will refer to Pharm.D. lipid clinic.  Lung cancer: Patient diagnosed with stage IV non-small cell lung cancer, adenocarcinoma diagnosed in December 2019.  Initially on treatment of carboplatin , Alimta , and Keytruda .  Keytruda  discontinued after 2 cycles due to allergy , Alimta  discontinued due to worsening renal insufficiency.  Patient has been continued on Krazati  600 mg twice daily, has been stable in recent years.   COPD: Patient with severe COPD, followed by pulmonology.  PFTs currently pending.    Disposition: F/u with Donovon Micheletti, NP two weeks post cath.   Signed, Alvine Mostafa D Dametra Whetsel, NP

## 2024-01-13 ENCOUNTER — Encounter: Payer: Self-pay | Admitting: Cardiology

## 2024-01-13 ENCOUNTER — Ambulatory Visit: Attending: Cardiology | Admitting: Cardiology

## 2024-01-13 VITALS — BP 126/76 | HR 99 | Ht 67.0 in | Wt 184.0 lb

## 2024-01-13 DIAGNOSIS — I25118 Atherosclerotic heart disease of native coronary artery with other forms of angina pectoris: Secondary | ICD-10-CM | POA: Diagnosis not present

## 2024-01-13 DIAGNOSIS — R002 Palpitations: Secondary | ICD-10-CM

## 2024-01-13 DIAGNOSIS — I10 Essential (primary) hypertension: Secondary | ICD-10-CM

## 2024-01-13 DIAGNOSIS — J449 Chronic obstructive pulmonary disease, unspecified: Secondary | ICD-10-CM

## 2024-01-13 DIAGNOSIS — R0609 Other forms of dyspnea: Secondary | ICD-10-CM

## 2024-01-13 DIAGNOSIS — I2510 Atherosclerotic heart disease of native coronary artery without angina pectoris: Secondary | ICD-10-CM | POA: Diagnosis not present

## 2024-01-13 DIAGNOSIS — J45909 Unspecified asthma, uncomplicated: Secondary | ICD-10-CM

## 2024-01-13 DIAGNOSIS — I493 Ventricular premature depolarization: Secondary | ICD-10-CM

## 2024-01-13 DIAGNOSIS — E782 Mixed hyperlipidemia: Secondary | ICD-10-CM

## 2024-01-13 DIAGNOSIS — C3490 Malignant neoplasm of unspecified part of unspecified bronchus or lung: Secondary | ICD-10-CM

## 2024-01-13 NOTE — Patient Instructions (Addendum)
 Medication Instructions:  Your physician recommends that you continue on your current medications as directed. Please refer to the Current Medication list given to you today.  *If you need a refill on your cardiac medications before your next appointment, please call your pharmacy*  Lab Work: 01/14/24-CBC, CMET 01/20/24- Fasting Lipids If you have labs (blood work) drawn today and your tests are completely normal, you will receive your results only by: MyChart Message (if you have MyChart) OR A paper copy in the mail If you have any lab test that is abnormal or we need to change your treatment, we will call you to review the results.  Testing/Procedures:  [place] [facility] A DEPT OF [place] AT [address] A DEPT OF THE [place] [address] [place] [address] Dept: [phone] Loc: [phone]  [name]  01/13/2024  You are scheduled for a Cardiac Catheterization on Friday, November 21 with Dr. Lonni Cash.  1. Please arrive at the [facility] (Main Entrance A) at [facility]: [address] [place], [place] [address] at 8:00 AM (This time is 5 hour(s) before your procedure to ensure your preparation).   Free valet parking service is available. You will check in at ADMITTING. The support person will be asked to wait in the waiting room.  It is OK to have someone drop you off and come back when you are ready to be discharged.    Special note: Every effort is made to have your procedure done on time. Please understand that emergencies sometimes delay scheduled procedures.  2. Diet: Nothing to eat after midnight.   3. Hydration: Please arrive 5 hours prior to procedure (8:00am)  4. Labs: You will need to have blood drawn on Friday, November 14 at [facility] (1st Floor), [address] [place], [place] [address]. You do not need to be fasting.  5.  Medication instructions in preparation for your procedure:    On the morning of your procedure, take your Aspirin  81 mg and any morning medicines NOT listed above.  You may use sips of water.  6. Plan to go home the same day, you will only stay overnight if medically necessary. 7. Bring a current list of your medications and current insurance cards. 8. You MUST have a responsible person to drive you home. 9. Someone MUST be with you the first 24 hours after you arrive home or your discharge will be delayed. 10. Please wear clothes that are easy to get on and off and wear slip-on shoes.  Thank you for allowing us  to care for you!   -- [facility]   Follow-Up: At [facility], you and your health needs are our priority.  As part of our continuing mission to provide you with exceptional heart care, our providers are all part of one team.  This team includes your primary Cardiologist (physician) and Advanced Practice Providers or APPs (Physician Assistants and Nurse Practitioners) who all work together to provide you with the care you need, when you need it.  Your next appointment:   2 week(s) Post Cath  Provider:   Katlyn West, NP           Other Instructions Referral sent to Lipid Clinic

## 2024-01-14 ENCOUNTER — Encounter: Payer: Self-pay | Admitting: Cardiology

## 2024-01-15 LAB — CBC
Hematocrit: 35.8 % (ref 34.0–46.6)
Hemoglobin: 11.7 g/dL (ref 11.1–15.9)
MCH: 31 pg (ref 26.6–33.0)
MCHC: 32.7 g/dL (ref 31.5–35.7)
MCV: 95 fL (ref 79–97)
Platelets: 267 x10E3/uL (ref 150–450)
RBC: 3.78 x10E6/uL (ref 3.77–5.28)
RDW: 13 % (ref 11.7–15.4)
WBC: 9.2 x10E3/uL (ref 3.4–10.8)

## 2024-01-15 LAB — COMPREHENSIVE METABOLIC PANEL WITH GFR
ALT: 13 IU/L (ref 0–32)
AST: 15 IU/L (ref 0–40)
Albumin: 4.2 g/dL (ref 3.9–4.9)
Alkaline Phosphatase: 85 IU/L (ref 49–135)
BUN/Creatinine Ratio: 15 (ref 12–28)
BUN: 19 mg/dL (ref 8–27)
Bilirubin Total: 0.3 mg/dL (ref 0.0–1.2)
CO2: 24 mmol/L (ref 20–29)
Calcium: 9 mg/dL (ref 8.7–10.3)
Chloride: 104 mmol/L (ref 96–106)
Creatinine, Ser: 1.3 mg/dL — ABNORMAL HIGH (ref 0.57–1.00)
Globulin, Total: 2.4 g/dL (ref 1.5–4.5)
Glucose: 80 mg/dL (ref 70–99)
Potassium: 5.1 mmol/L (ref 3.5–5.2)
Sodium: 140 mmol/L (ref 134–144)
Total Protein: 6.6 g/dL (ref 6.0–8.5)
eGFR: 45 mL/min/1.73 — ABNORMAL LOW (ref >59)

## 2024-01-17 ENCOUNTER — Ambulatory Visit: Payer: Self-pay | Admitting: Cardiology

## 2024-01-17 ENCOUNTER — Other Ambulatory Visit: Payer: Self-pay

## 2024-01-17 DIAGNOSIS — I25118 Atherosclerotic heart disease of native coronary artery with other forms of angina pectoris: Secondary | ICD-10-CM

## 2024-01-17 DIAGNOSIS — J449 Chronic obstructive pulmonary disease, unspecified: Secondary | ICD-10-CM

## 2024-01-17 DIAGNOSIS — R0609 Other forms of dyspnea: Secondary | ICD-10-CM

## 2024-01-18 NOTE — Telephone Encounter (Signed)
 Patent read Diana West, NP message on MyChart 01/18/24.

## 2024-01-19 ENCOUNTER — Telehealth: Payer: Self-pay | Admitting: *Deleted

## 2024-01-19 NOTE — Telephone Encounter (Signed)
 Cardiac Catheterization scheduled at [facility] for: Friday January 21, 2024 1:30 PM Arrival time [facility] Main Entrance A at: 8AM-pre-procedure hydration  Diet: -May have light meal until 7:30 AM. (6 hours before procedure time) Approved light meal consists of plain toast, fruit, light soups, crackers.   Hydration: -May drink clear liquids until 2 hours before the procedure.  Approved liquids: Water, clear tea, black coffee, fruit juices-non-citric and without pulp,Gatorade, plain Jello/popsicles.  No PO hydration (16 oz water)-going in early for IVF  Medication instructions: -Usual morning medications can be taken including aspirin  81 mg.  Plan to go home the same day, you will only stay overnight if medically necessary.  You must have responsible adult to drive you home.  Someone must be with you the first 24 hours after you arrive home.  Reviewed procedure instructions/pre-procedure hydration with patient.

## 2024-01-21 ENCOUNTER — Encounter (HOSPITAL_COMMUNITY)
Admission: RE | Disposition: A | Discharge status: Home / Self Care | Payer: Self-pay | Source: Home / Self Care | Attending: Cardiovascular Disease

## 2024-01-21 ENCOUNTER — Ambulatory Visit (HOSPITAL_COMMUNITY)
Admission: RE | Admit: 2024-01-21 | Discharge: 2024-01-23 | Disposition: A | Discharge status: Home / Self Care | Attending: Cardiovascular Disease | Admitting: Cardiovascular Disease

## 2024-01-21 ENCOUNTER — Other Ambulatory Visit: Payer: Self-pay

## 2024-01-21 DIAGNOSIS — Z79899 Other long term (current) drug therapy: Secondary | ICD-10-CM | POA: Insufficient documentation

## 2024-01-21 DIAGNOSIS — C3490 Malignant neoplasm of unspecified part of unspecified bronchus or lung: Secondary | ICD-10-CM | POA: Diagnosis not present

## 2024-01-21 DIAGNOSIS — E785 Hyperlipidemia, unspecified: Secondary | ICD-10-CM | POA: Diagnosis not present

## 2024-01-21 DIAGNOSIS — J449 Chronic obstructive pulmonary disease, unspecified: Secondary | ICD-10-CM | POA: Diagnosis not present

## 2024-01-21 DIAGNOSIS — I10 Essential (primary) hypertension: Secondary | ICD-10-CM | POA: Diagnosis present

## 2024-01-21 DIAGNOSIS — I200 Unstable angina: Secondary | ICD-10-CM | POA: Diagnosis present

## 2024-01-21 DIAGNOSIS — I25110 Atherosclerotic heart disease of native coronary artery with unstable angina pectoris: Secondary | ICD-10-CM

## 2024-01-21 DIAGNOSIS — R0609 Other forms of dyspnea: Secondary | ICD-10-CM

## 2024-01-21 DIAGNOSIS — Z955 Presence of coronary angioplasty implant and graft: Secondary | ICD-10-CM

## 2024-01-21 DIAGNOSIS — I2510 Atherosclerotic heart disease of native coronary artery without angina pectoris: Secondary | ICD-10-CM | POA: Diagnosis present

## 2024-01-21 DIAGNOSIS — J9621 Acute and chronic respiratory failure with hypoxia: Secondary | ICD-10-CM | POA: Diagnosis present

## 2024-01-21 DIAGNOSIS — I25118 Atherosclerotic heart disease of native coronary artery with other forms of angina pectoris: Secondary | ICD-10-CM

## 2024-01-21 DIAGNOSIS — R072 Precordial pain: Secondary | ICD-10-CM

## 2024-01-21 DIAGNOSIS — Z716 Tobacco abuse counseling: Secondary | ICD-10-CM

## 2024-01-21 HISTORY — PX: CORONARY STENT INTERVENTION: CATH118234

## 2024-01-21 HISTORY — PX: LEFT HEART CATH AND CORONARY ANGIOGRAPHY: CATH118249

## 2024-01-21 LAB — LIPID PANEL
Chol/HDL Ratio: 6.6 ratio — ABNORMAL HIGH (ref 0.0–4.4)
Cholesterol, Total: 329 mg/dL — ABNORMAL HIGH (ref 100–199)
HDL: 50 mg/dL (ref >39)
LDL Chol Calc (NIH): 219 mg/dL — ABNORMAL HIGH (ref 0–99)
Triglycerides: 291 mg/dL — ABNORMAL HIGH (ref 0–149)
VLDL Cholesterol Cal: 60 mg/dL — ABNORMAL HIGH (ref 5–40)

## 2024-01-21 LAB — POCT ACTIVATED CLOTTING TIME
Activated Clotting Time: 245 s
Activated Clotting Time: 256 s
Activated Clotting Time: 285 s
Activated Clotting Time: 297 s
Activated Clotting Time: 285 s

## 2024-01-21 MED ORDER — ACETAMINOPHEN 325 MG PO TABS: 650.0000 mg | ORAL_TABLET | ORAL | Status: DC | PRN

## 2024-01-21 MED ORDER — SODIUM CHLORIDE 0.9% FLUSH: 3.0000 mL | INTRAVENOUS | Status: DC | PRN

## 2024-01-21 MED ORDER — HEPARIN (PORCINE) IN NACL 2000-0.9 UNIT/L-% IV SOLN
INTRAVENOUS | Status: DC | PRN
Start: 2024-01-21 — End: 2024-01-21
  Administered 2024-01-21: 1000 mL

## 2024-01-21 MED ORDER — FENTANYL CITRATE (PF) 100 MCG/2ML IJ SOLN
INTRAMUSCULAR | Status: AC
  Filled 2024-01-21: qty 2

## 2024-01-21 MED ORDER — FREE WATER
500.0000 mL | Freq: Once | Status: AC
  Administered 2024-01-21: 500 mL via ORAL

## 2024-01-21 MED ORDER — SODIUM CHLORIDE 0.9 % WEIGHT BASED INFUSION: 1.0000 mL/kg/h | INTRAVENOUS | Status: DC

## 2024-01-21 MED ORDER — MIDAZOLAM HCL 2 MG/2ML IJ SOLN
INTRAMUSCULAR | Status: AC
  Filled 2024-01-21: qty 2

## 2024-01-21 MED ORDER — NITROGLYCERIN 1 MG/10 ML FOR IR/CATH LAB
INTRA_ARTERIAL | Status: AC
  Filled 2024-01-21: qty 10

## 2024-01-21 MED ORDER — LIDOCAINE HCL (PF) 1 % IJ SOLN
INTRAMUSCULAR | Status: DC | PRN
Start: 2024-01-21 — End: 2024-01-21
  Administered 2024-01-21: 5 mL via INTRADERMAL

## 2024-01-21 MED ORDER — OXYCODONE-ACETAMINOPHEN 5-325 MG PO TABS
1.0000 | ORAL_TABLET | ORAL | Status: DC | PRN
  Administered 2024-01-21 – 2024-01-23 (×7): 1 via ORAL
  Filled 2024-01-21 (×6): qty 1

## 2024-01-21 MED ORDER — FAMOTIDINE IN NACL 20-0.9 MG/50ML-% IV SOLN
INTRAVENOUS | Status: DC | PRN
  Administered 2024-01-21: 20 mg via INTRAVENOUS

## 2024-01-21 MED ORDER — HEPARIN SODIUM (PORCINE) 1000 UNIT/ML IJ SOLN
INTRAMUSCULAR | Status: AC
  Filled 2024-01-21: qty 10

## 2024-01-21 MED ORDER — SODIUM CHLORIDE 0.9% FLUSH
3.0000 mL | Freq: Two times a day (BID) | INTRAVENOUS | Status: DC
  Administered 2024-01-21 – 2024-01-23 (×4): 3 mL via INTRAVENOUS

## 2024-01-21 MED ORDER — TRAZODONE HCL 50 MG PO TABS
50.0000 mg | ORAL_TABLET | Freq: Every day | ORAL | Status: DC
  Administered 2024-01-21 – 2024-01-22 (×2): 50 mg via ORAL
  Filled 2024-01-21 (×2): qty 1

## 2024-01-21 MED ORDER — FENTANYL CITRATE (PF) 100 MCG/2ML IJ SOLN
50.0000 ug | INTRAMUSCULAR | Status: DC | PRN
Start: 2024-01-21 — End: 2024-01-21

## 2024-01-21 MED ORDER — HEPARIN SODIUM (PORCINE) 1000 UNIT/ML IJ SOLN
INTRAMUSCULAR | Status: DC | PRN
Start: 2024-01-21 — End: 2024-01-21
  Administered 2024-01-21: 10000 [IU] via INTRAVENOUS
  Administered 2024-01-21 (×2): 3000 [IU] via INTRAVENOUS

## 2024-01-21 MED ORDER — BUPROPION HCL ER (XL) 150 MG PO TB24: 150.0000 mg | ORAL_TABLET | ORAL | Status: DC

## 2024-01-21 MED ORDER — ALBUTEROL SULFATE (2.5 MG/3ML) 0.083% IN NEBU: 2.5000 mg | INHALATION_SOLUTION | Freq: Four times a day (QID) | RESPIRATORY_TRACT | Status: DC | PRN

## 2024-01-21 MED ORDER — CLOPIDOGREL BISULFATE 300 MG PO TABS
ORAL_TABLET | ORAL | Status: DC | PRN
Start: 2024-01-21 — End: 2024-01-21
  Administered 2024-01-21: 600 mg via ORAL

## 2024-01-21 MED ORDER — ASPIRIN 81 MG PO CHEW: 81.0000 mg | CHEWABLE_TABLET | ORAL | Status: DC

## 2024-01-21 MED ORDER — FAMOTIDINE IN NACL 20-0.9 MG/50ML-% IV SOLN
INTRAVENOUS | Status: AC
  Filled 2024-01-21: qty 50

## 2024-01-21 MED ORDER — MIDAZOLAM HCL (PF) 2 MG/2ML IJ SOLN
INTRAMUSCULAR | Status: DC | PRN
Start: 2024-01-21 — End: 2024-01-21
  Administered 2024-01-21 (×2): 1 mg via INTRAVENOUS

## 2024-01-21 MED ORDER — BUPROPION HCL ER (XL) 150 MG PO TB24
150.0000 mg | ORAL_TABLET | Freq: Every day | ORAL | Status: DC
  Administered 2024-01-21 – 2024-01-22 (×2): 150 mg via ORAL
  Filled 2024-01-21 (×2): qty 1

## 2024-01-21 MED ORDER — SODIUM CHLORIDE 0.9 % IV SOLN: 250.0000 mL | INTRAVENOUS | Status: AC | PRN

## 2024-01-21 MED ORDER — LORAZEPAM 1 MG PO TABS
2.0000 mg | ORAL_TABLET | Freq: Every evening | ORAL | Status: DC
  Administered 2024-01-21 – 2024-01-22 (×2): 2 mg via ORAL
  Filled 2024-01-21: qty 4
  Filled 2024-01-21: qty 2

## 2024-01-21 MED ORDER — BUDESON-GLYCOPYRROL-FORMOTEROL 160-9-4.8 MCG/ACT IN AERO
2.0000 | INHALATION_SPRAY | Freq: Two times a day (BID) | RESPIRATORY_TRACT | Status: DC
  Administered 2024-01-21 – 2024-01-23 (×4): 2 via RESPIRATORY_TRACT
  Filled 2024-01-21: qty 5.9

## 2024-01-21 MED ORDER — FENTANYL CITRATE (PF) 100 MCG/2ML IJ SOLN
INTRAMUSCULAR | Status: DC | PRN
  Administered 2024-01-21 (×2): 25 ug via INTRAVENOUS

## 2024-01-21 MED ORDER — SODIUM CHLORIDE 0.9 % WEIGHT BASED INFUSION
3.0000 mL/kg/h | INTRAVENOUS | Status: DC
  Administered 2024-01-21: 3 mL/kg/h via INTRAVENOUS

## 2024-01-21 MED ORDER — BUPROPION HCL ER (XL) 150 MG PO TB24
300.0000 mg | ORAL_TABLET | Freq: Every day | ORAL | Status: DC
  Administered 2024-01-22 – 2024-01-23 (×2): 300 mg via ORAL
  Filled 2024-01-21 (×2): qty 2

## 2024-01-21 MED ORDER — CLOPIDOGREL BISULFATE 300 MG PO TABS
ORAL_TABLET | ORAL | Status: AC
  Filled 2024-01-21: qty 2

## 2024-01-21 MED ORDER — IOHEXOL 350 MG/ML SOLN
INTRAVENOUS | Status: DC | PRN
  Administered 2024-01-21: 170 mL

## 2024-01-21 MED ORDER — NITROGLYCERIN 0.4 MG SL SUBL: 0.4000 mg | SUBLINGUAL_TABLET | SUBLINGUAL | Status: DC | PRN

## 2024-01-21 MED ORDER — HYDRALAZINE HCL 20 MG/ML IJ SOLN: 10.0000 mg | INTRAMUSCULAR | Status: AC | PRN

## 2024-01-21 MED ORDER — OXYCODONE HCL 5 MG PO TABS
ORAL_TABLET | ORAL | Status: AC
  Filled 2024-01-21: qty 1

## 2024-01-21 MED ORDER — LABETALOL HCL 5 MG/ML IV SOLN: 10.0000 mg | INTRAVENOUS | Status: AC | PRN

## 2024-01-21 MED ORDER — NITROGLYCERIN 1 MG/10 ML FOR IR/CATH LAB
INTRA_ARTERIAL | Status: DC | PRN
  Administered 2024-01-21 (×2): 200 ug via INTRACORONARY

## 2024-01-21 MED ORDER — OXYCODONE-ACETAMINOPHEN 5-325 MG PO TABS
ORAL_TABLET | ORAL | Status: AC
  Filled 2024-01-21: qty 1

## 2024-01-21 MED ORDER — LEVOTHYROXINE SODIUM 50 MCG PO TABS
50.0000 ug | ORAL_TABLET | Freq: Every morning | ORAL | Status: DC
  Administered 2024-01-22 – 2024-01-23 (×2): 50 ug via ORAL
  Filled 2024-01-21 (×2): qty 1

## 2024-01-21 MED ORDER — MONTELUKAST SODIUM 10 MG PO TABS
10.0000 mg | ORAL_TABLET | Freq: Every day | ORAL | Status: DC
Start: 2024-01-21 — End: 2024-01-23
  Administered 2024-01-21 – 2024-01-22 (×2): 10 mg via ORAL
  Filled 2024-01-21 (×2): qty 1

## 2024-01-21 MED ORDER — ASPIRIN 81 MG PO CHEW
81.0000 mg | CHEWABLE_TABLET | Freq: Every day | ORAL | Status: DC
  Administered 2024-01-22 – 2024-01-23 (×2): 81 mg via ORAL
  Filled 2024-01-21 (×2): qty 1

## 2024-01-21 MED ORDER — HEPARIN (PORCINE) IN NACL 1000-0.9 UT/500ML-% IV SOLN
INTRAVENOUS | Status: DC | PRN
  Administered 2024-01-21 (×3): 500 mL

## 2024-01-21 MED ORDER — ONDANSETRON HCL 4 MG/2ML IJ SOLN: 4.0000 mg | Freq: Four times a day (QID) | INTRAMUSCULAR | Status: DC | PRN

## 2024-01-21 MED ORDER — CLOPIDOGREL BISULFATE 75 MG PO TABS
75.0000 mg | ORAL_TABLET | Freq: Every day | ORAL | Status: DC
  Administered 2024-01-22 – 2024-01-23 (×2): 75 mg via ORAL
  Filled 2024-01-21 (×2): qty 1

## 2024-01-21 MED ORDER — FENTANYL CITRATE (PF) 100 MCG/2ML IJ SOLN
INTRAMUSCULAR | Status: AC
  Administered 2024-01-21: 50 ug via INTRAVENOUS
  Filled 2024-01-21: qty 2

## 2024-01-21 MED ORDER — ADAGRASIB 200 MG PO TABS: 400.0000 mg | ORAL_TABLET | Freq: Two times a day (BID) | ORAL | Status: DC

## 2024-01-21 MED ORDER — FENTANYL CITRATE (PF) 50 MCG/ML IJ SOSY
50.0000 ug | PREFILLED_SYRINGE | INTRAMUSCULAR | Status: DC | PRN
Start: 2024-01-21 — End: 2024-01-23
  Administered 2024-01-21: 50 ug via INTRAVENOUS
  Filled 2024-01-21: qty 1

## 2024-01-21 MED ORDER — LIDOCAINE HCL (PF) 1 % IJ SOLN
INTRAMUSCULAR | Status: AC
  Filled 2024-01-21: qty 30

## 2024-01-21 NOTE — Progress Notes (Signed)
 All air removed from fem-stop at 1910 remaining at a Level 0. Straps loosened until dome completely off site. No bleeding or hematoma observed. Gauze and tegaderm placed on site. Bed rest started at 1940 x 4hrs

## 2024-01-21 NOTE — Interval H&P Note (Signed)
 History and Physical Interval Note:  01/21/2024 1:50 PM  [name]  has presented today for surgery, with the diagnosis of cad - angina.  The various methods of treatment have been discussed with the patient and family. After consideration of risks, benefits and other options for treatment, the patient has consented to  Procedure(s): LEFT HEART CATH AND CORONARY ANGIOGRAPHY (N/A) as a surgical intervention.  The patient's history has been reviewed, patient examined, no change in status, stable for surgery.  I have reviewed the patient's chart and labs.  Questions were answered to the patient's satisfaction.    Cath Lab Visit (complete for each Cath Lab visit)  Clinical Evaluation Leading to the Procedure:   ACS: No.  Non-ACS:    Anginal Classification: CCS III  Anti-ischemic medical therapy: Minimal Therapy (1 class of medications)  Non-Invasive Test Results: No non-invasive testing performed  Prior CABG: No previous CABG        Lonni Cash

## 2024-01-22 ENCOUNTER — Encounter (HOSPITAL_COMMUNITY): Payer: Self-pay | Admitting: Cardiovascular Disease

## 2024-01-22 DIAGNOSIS — I25110 Atherosclerotic heart disease of native coronary artery with unstable angina pectoris: Secondary | ICD-10-CM | POA: Diagnosis not present

## 2024-01-22 DIAGNOSIS — C3490 Malignant neoplasm of unspecified part of unspecified bronchus or lung: Secondary | ICD-10-CM | POA: Diagnosis not present

## 2024-01-22 DIAGNOSIS — J449 Chronic obstructive pulmonary disease, unspecified: Secondary | ICD-10-CM | POA: Diagnosis not present

## 2024-01-22 DIAGNOSIS — E785 Hyperlipidemia, unspecified: Secondary | ICD-10-CM | POA: Diagnosis not present

## 2024-01-22 DIAGNOSIS — I200 Unstable angina: Secondary | ICD-10-CM | POA: Diagnosis not present

## 2024-01-22 LAB — BASIC METABOLIC PANEL WITH GFR
Anion gap: 10 (ref 5–15)
Anion gap: 13 (ref 5–15)
BUN: 23 mg/dL (ref 8–23)
BUN: 25 mg/dL — ABNORMAL HIGH (ref 8–23)
CO2: 23 mmol/L (ref 22–32)
CO2: 20 mmol/L — ABNORMAL LOW (ref 22–32)
Calcium: 8.6 mg/dL — ABNORMAL LOW (ref 8.9–10.3)
Calcium: 9 mg/dL (ref 8.9–10.3)
Chloride: 105 mmol/L (ref 98–111)
Chloride: 102 mmol/L (ref 98–111)
Creatinine, Ser: 1.5 mg/dL — ABNORMAL HIGH (ref 0.44–1.00)
Creatinine, Ser: 1.54 mg/dL — ABNORMAL HIGH (ref 0.44–1.00)
GFR, Estimated: 37 mL/min — ABNORMAL LOW (ref >60)
GFR, Estimated: 36 mL/min — ABNORMAL LOW (ref >60)
Glucose, Bld: 104 mg/dL — ABNORMAL HIGH (ref 70–99)
Glucose, Bld: 154 mg/dL — ABNORMAL HIGH (ref 70–99)
Potassium: 4.6 mmol/L (ref 3.5–5.1)
Potassium: 4.7 mmol/L (ref 3.5–5.1)
Sodium: 138 mmol/L (ref 135–145)
Sodium: 135 mmol/L (ref 135–145)

## 2024-01-22 LAB — CBC
HCT: 29.9 % — ABNORMAL LOW (ref 36.0–46.0)
HCT: 32 % — ABNORMAL LOW (ref 36.0–46.0)
Hemoglobin: 9.7 g/dL — ABNORMAL LOW (ref 12.0–15.0)
Hemoglobin: 10.4 g/dL — ABNORMAL LOW (ref 12.0–15.0)
MCH: 30.3 pg (ref 26.0–34.0)
MCH: 30.4 pg (ref 26.0–34.0)
MCHC: 32.4 g/dL (ref 30.0–36.0)
MCHC: 32.5 g/dL (ref 30.0–36.0)
MCV: 93.4 fL (ref 80.0–100.0)
MCV: 93.6 fL (ref 80.0–100.0)
Platelets: 199 K/uL (ref 150–400)
Platelets: 273 K/uL (ref 150–400)
RBC: 3.2 MIL/uL — ABNORMAL LOW (ref 3.87–5.11)
RBC: 3.42 MIL/uL — ABNORMAL LOW (ref 3.87–5.11)
RDW: 13.3 % (ref 11.5–15.5)
RDW: 13.4 % (ref 11.5–15.5)
WBC: 8.6 K/uL (ref 4.0–10.5)
WBC: 11.3 K/uL — ABNORMAL HIGH (ref 4.0–10.5)
nRBC: 0 % (ref 0.0–0.2)
nRBC: 0 % (ref 0.0–0.2)

## 2024-01-22 MED ORDER — GUAIFENESIN 100 MG/5ML PO LIQD
5.0000 mL | ORAL | Status: DC | PRN
  Administered 2024-01-23: 5 mL via ORAL
  Filled 2024-01-22: qty 10

## 2024-01-22 NOTE — Progress Notes (Signed)
 CARDIAC REHAB PHASE I   PRE:  Rate/Rhythm: 95 SR    BP: sitting 101/59    SpO2: 96 3L  MODE:  Ambulation: 200 ft   POST:  Rate/Rhythm: 122 ST    BP: sitting 111/76     SpO2: 99 3L   Pt ambulated with RW and 3L O2, standby assist.  C/o groin tightness/pain but no abnormalities noted. Denied CP, to recliner.  Discussed with pt stents, restrictions, Plavix  importance, diet, exercise, and CRPII. Pt receptive but not very interested in CRPII. Will refer to [facility] CRPII.  [phone]  Aliene Aris BS, ACSM-CEP 01/22/2024 8:51 AM

## 2024-01-22 NOTE — Discharge Summary (Incomplete)
 Discharge Summary   Patient ID: [name] MRN: [id number]; DOB: 05-Aug-1955  Admit date: 01/21/2024 Discharge date: 01/23/2024  PCP:  [name], NP   [facility] Providers Cardiologist:  Lonni Cash, MD   a    Discharge Diagnoses  Principal Problem:   Unstable angina [facility]) Active Problems:   Hyperlipemia   CAD, NATIVE VESSEL   Tobacco abuse counseling   COPD (chronic obstructive pulmonary disease) (HCC)   Acute on chronic respiratory failure with hypoxia [facility])   Essential hypertension   Diagnostic Studies/Procedures   Left heart cath 01/21/24:   Prox RCA to Mid RCA lesion is 99% stenosed.   Prox RCA lesion is 70% stenosed.   Mid RCA to Dist RCA lesion is 80% stenosed.   3rd Mrg lesion is 50% stenosed.   RPDA lesion is 70% stenosed.   Mid LAD lesion is 80% stenosed.   A drug-eluting stent was successfully placed using a STENT SYNERGY XD 2.25X24.   A drug-eluting stent was successfully placed using a STENT SYNERGY XD [id number].   Drug-coated balloon angioplasty was performed using a BALL DC AGENT 2.50X15.   Post intervention, there is a 0% residual stenosis.   Post intervention, there is a 0% residual stenosis.   Post intervention, there is a 0% residual stenosis.   Post intervention, there is a 0% residual stenosis.   Severe restenosis mid LAD stented segment.  Successful PTCA with cutting balloon angioplasty and drug eluting balloon angioplasty of the mid LAD stented segment Mild non-obstructive disease in the distal OM branch Large dominant RCA with severe stenosis in the proximal vessel, severe stenosis in the mid stented segment and severe stenosis in distal vessel beyond the old stent.  Successful PTCA/DES x 1 proximal and mid RCA Normal LV systolic function   Recommendations: DAPT with ASA and Plavix  for a year.  _____________   History of Present Illness   [name] is a 68 y.o. female with history of asthma, depression,  GERD, fibromyalgia, advanced lung cancer, PVCs, PACs, SVT and CAD.   LHC in 04/2008 demonstrating 80% stenosis in the mid RCA treated with a Xience DES.  Patient was noted to have possible SVT during cardiac rehab in 05/2008.  Relook LHC in 05/2008 indicated proximal RCA 25%, mid RCA stent patent.  Holter monitor in 03/2009 indicated normal sinus rhythm with PACs and PVCs.  Stress Myoview  in 10/2012 showed a reversible basal inferior perfusion defect, described class III angina on her follow-up visit.  Cardiac cath on 12/06/2012 with severe stenosis in the mid RCA just before the old stent which also had a restenosis treated entire segment with a 2.75 x 28 mm Promus Premier DES.  Following patient had recurrent chest pain and was seen in the office on 05/05/2013.  Cardiac cath on 05/08/2013 with severe stenosis of the mid LAD, a 2.5 x 16 mm Promus DES was deployed in the mid LAD.  She also had complaints of palpitations and was started on Lopressor  12.5 mg twice daily.   Cardiac cath in June 2017 with patent stents in the LAD and RCA.  LVEF was normal.  She underwent a nuclear stress test in 2018 in setting of exertional dyspnea that showed no evidence of ischemia or prior infarction.  EF was noted be mildly decreased at 48%.  Echo in August 2018 showed normal LVEF at 55 to 60%, G1 DD was noted, wall motion was normal.  No significant valvular disease was noted.  In December 2019 she was diagnosed with small cell lung cancer and had undergone chemotherapy.  Nuclear stress test in May 2022 with no ischemia.  Cardiac monitor in June 2022 with sinus with 2 short runs of SVT, longest 8 beats, echo in June 2022 with LVEF 55 to 60%.  Cardiac MRI in July 2022 with large epicardial adipose layer, LVEF 56%.  Basal inferior septal scar.   Patient was last seen in clinic by cardiology on 07/21/2021 by Dr. Verlin.  She remained stable from a cardiac standpoint. She was seen back in clinic 01/13/24 with concern for worsening  angina.   She was scheduled for outpatient planned heart catheterization.    Hospital Course   Consultants: none   CAD  Heart catheterization demonstrated severe restenosis of the mid LAD stented segment successfully treated with PTCA with Cutting Balloon angioplasty and DES using a 2.5 x 15 mm ball DC agent.  There was also severe in-stent restenosis and stenosis beyond prior stent and the RCA treated with PTCA and DES using 2.75 x 32 mm stent overlapping previously placed stent as well as 2.25 x 24 mm stent in the mid to distal RCA.  Patient has overlapping stents in the RCA. Femoral approach was used as she has had prior failed radial approaches.  Perclose device failed and manual pressure was applied.  FemoStop was subsequently used.  CBC overnight with hemoglobin dropping to 9.7 from 11.  Repeat stat CBC with stable hemoglobin of 10.4.  Renal function also remained stable with creatinine near her baseline of 1.5.  She has ambulated the halls.  She was observed overnight with groin soreness and fatigue.   She was not taking plavix  prior to this admission. Will continue ASA and plavix . May consider checking CYP2C19 given ISR.   Hyperlipidemia with LDL goal < 55 01/20/2024: Cholesterol, Total 329; HDL 50; LDL Chol Calc (NIH) 219; Triglycerides 291 Intolerant to statins Will need to consider PCSK9i   Paroxysmal SVT, PAC, PVC - resume home lopressor    Lung cancer  - stage IV NSCLD Chronic respiratory failure COPD - on home O2   Pt seen and examined by Dr. Almetta and deemed stable for discharge. Follow up has been arranged.       Did the patient have an acute coronary syndrome (MI, NSTEMI, STEMI, etc) this admission?:  No                               Did the patient have a percutaneous coronary intervention (stent / angioplasty)?:  Yes.     Cath/PCI Registry Performance & Quality Measures: Aspirin  prescribed? - Yes ADP Receptor Inhibitor (Plavix /Clopidogrel ,  Brilinta/Ticagrelor or Effient/Prasugrel) prescribed (includes medically managed patients)? - Yes High Intensity Statin (Lipitor 40-80mg  or Crestor 20-40mg ) prescribed? - No - statin intolerance For EF <40%, was ACEI/ARB prescribed? - Not Applicable (EF >/= 40%) For EF <40%, Aldosterone Antagonist (Spironolactone or Eplerenone) prescribed? - Not Applicable (EF >/= 40%) Cardiac Rehab Phase II ordered? - Yes       The patient will be scheduled for a TOC follow up appointment in 7-14 days.  A message has been sent to the [facility] and Scheduling Pool at the office where the patient should be seen for follow up.  _____________  Discharge Vitals Blood pressure 99/78, pulse 87, temperature 98.7 F (37.1 C), temperature source Oral, resp. rate 19, height 5' 7 (1.702 m), weight 73 kg, SpO2 100%.  Filed Weights   01/21/24 0827  Weight: 73 kg    Labs & Radiologic Studies  CBC Recent Labs    01/22/24 0217 01/22/24 0816  WBC 8.6 11.3*  HGB 9.7* 10.4*  HCT 29.9* 32.0*  MCV 93.4 93.6  PLT 199 273   Basic Metabolic Panel Recent Labs    88/77/74 0217 01/22/24 0816  NA 138 135  K 4.6 4.7  CL 105 102  CO2 23 20*  GLUCOSE 104* 154*  BUN 23 25*  CREATININE 1.50* 1.54*  CALCIUM  8.6* 9.0   Liver Function Tests No results for input(s): AST, ALT, ALKPHOS, BILITOT, PROT, ALBUMIN in the last 72 hours. No results for input(s): LIPASE, AMYLASE in the last 72 hours. High Sensitivity Troponin:   No results for input(s): TROPONINIHS in the last 720 hours.  No results for input(s): TRNPT in the last 720 hours.  BNP Invalid input(s): POCBNP No results for input(s): PROBNP in the last 72 hours.  No results for input(s): BNP in the last 72 hours.  D-Dimer No results for input(s): DDIMER in the last 72 hours. Hemoglobin A1C No results for input(s): HGBA1C in the last 72 hours. Fasting Lipid Panel Recent Labs    01/20/24 0941  CHOL 329*  HDL 50  LDLCALC 219*   TRIG 291*  CHOLHDL 6.6*   No results found for: LIPOA  Thyroid  Function Tests No results for input(s): TSH, T4TOTAL, T3FREE, THYROIDAB in the last 72 hours.  Invalid input(s): FREET3 _____________  CARDIAC CATHETERIZATION Result Date: 01/21/2024   Prox RCA to Mid RCA lesion is 99% stenosed.   Prox RCA lesion is 70% stenosed.   Mid RCA to Dist RCA lesion is 80% stenosed.   3rd Mrg lesion is 50% stenosed.   RPDA lesion is 70% stenosed.   Mid LAD lesion is 80% stenosed.   A drug-eluting stent was successfully placed using a STENT SYNERGY XD 2.25X24.   A drug-eluting stent was successfully placed using a STENT SYNERGY XD [id number].   Drug-coated balloon angioplasty was performed using a BALL DC AGENT 2.50X15.   Post intervention, there is a 0% residual stenosis.   Post intervention, there is a 0% residual stenosis.   Post intervention, there is a 0% residual stenosis.   Post intervention, there is a 0% residual stenosis. Severe restenosis mid LAD stented segment. Successful PTCA with cutting balloon angioplasty and drug eluting balloon angioplasty of the mid LAD stented segment Mild non-obstructive disease in the distal OM branch Large dominant RCA with severe stenosis in the proximal vessel, severe stenosis in the mid stented segment and severe stenosis in distal vessel beyond the old stent. Successful PTCA/DES x 1 proximal and mid RCA Normal LV systolic function Recommendations: DAPT with ASA and Plavix  for a year.    Disposition Pt is being discharged home today in good condition.  Follow-up Plans & Appointments  Discharge Instructions     AMB Referral to Advanced Lipid Disorders Clinic   Complete by: As directed    Internal Lipid Clinic Referral Scheduling  Internal lipid clinic referrals are providers within [facility], who wish to refer established patients for routine management (help in starting PCSK9 inhibitor therapy) or advanced therapies.  Internal MD referral criteria:               1. All patients with LDL>190 mg/dL  2. All patients with Triglycerides >500 mg/dL  3. Patients with suspected or confirmed heterozygous familial hyperlipidemia (HeFH) or homozygous familial hyperlipidemia (HoFH)  4. Patients  with family history of suspicious for genetic dyslipidemia desiring genetic testing  5. Patients refractory to standard guideline based therapy  6. Patients with statin intolerance (failed 2 statins, one of which must be a high potency statin)  7. Patients who the provider desires to be seen by MD   Internal PharmD referral criteria:   1. Follow-up patients for medication management  2. Follow-up for compliance monitoring  3. Patients for drug education  4. Patients with statin intolerance  5. PCSK9 inhibitor education and prior authorization approvals  6. Patients with triglycerides <500 mg/dL  External Lipid Clinic Referral  External lipid clinic referrals are for providers outside of [facility], considered new clinic patients - automatically routed to MD schedule   Amb Referral to Cardiac Rehabilitation   Complete by: As directed    Diagnosis:  Coronary Stents PTCA     After initial evaluation and assessments completed: Virtual Based Care may be provided alone or in conjunction with Phase 2 Cardiac Rehab based on patient barriers.: Yes   Intensive Cardiac Rehabilitation (ICR) [facility] location only OR Traditional Cardiac Rehabilitation (TCR) *If criteria for ICR are not met will enroll in TCR [facility] only): Yes       Discharge Medications Allergies as of 01/23/2024       Reactions   Ciprofloxacin Hives   Levofloxacin Hives   Statins Other (See Comments)   Elevated liver enzymes   Tricor  [fenofibrate ] Hives, Itching   Clarithromycin Hives, Itching   Compazine  [prochlorperazine ] Other (See Comments)   Tardive dyskinesia   Latex Other (See Comments)   Patient states Messes with my skin.   Sulfamethoxazole-trimethoprim Hives, Itching   Codeine  Nausea And Vomiting        Medication List     STOP taking these medications    aspirin  325 MG tablet Replaced by: aspirin  EC 81 MG tablet   doxycycline  100 MG tablet Commonly known as: VIBRA -TABS   gabapentin  100 MG capsule Commonly known as: NEURONTIN        TAKE these medications    albuterol  (5 MG/ML) 0.5% nebulizer solution Commonly known as: PROVENTIL  Take 2.5 mg by nebulization every 6 (six) hours as needed for wheezing.   albuterol  108 (90 Base) MCG/ACT inhaler Commonly known as: VENTOLIN  HFA Inhale 1 puff into the lungs every 6 (six) hours as needed (wheezing/shortness of breath.).   aspirin  EC 81 MG tablet Take 1 tablet (81 mg total) by mouth daily. Swallow whole. Replaces: aspirin  325 MG tablet   Belsomra  20 MG Tabs Generic drug: Suvorexant  Take 1 tablet by mouth at bedtime as needed.   Breztri  Aerosphere 160-9-4.8 MCG/ACT Aero inhaler Generic drug: budesonide -glycopyrrolate -formoterol  Inhale 2 puffs into the lungs in the morning and at bedtime.   buPROPion  150 MG 24 hr tablet Commonly known as: WELLBUTRIN  XL Take 1-2 tablets by mouth See admin instructions. Patient takes 2 tablets (300mg ) in the morning and 1 tablet (150mg ) in the evening   clopidogrel  75 MG tablet Commonly known as: PLAVIX  Take 1 tablet (75 mg total) by mouth daily with breakfast.   DRY EYES OP Place 1 drop into both eyes daily as needed (dry eyes).   FeroSul 325 (65 Fe) MG tablet Generic drug: ferrous sulfate  TAKE 1 TABLET(325 MG) BY MOUTH DAILY WITH BREAKFAST   HYDROcodone -acetaminophen  7.5-325 MG tablet Commonly known as: NORCO Take 1 tablet by mouth every 6 (six) hours as needed.   ipratropium-albuterol  0.5-2.5 (3) MG/3ML Soln Commonly known as: DUONEB Inhale 3 mLs into the lungs  every 6 (six) hours as needed (Asthma).   Krazati  200 MG tablet Generic drug: adagrasib  Take 2 tablets (400 mg total) by mouth 2 (two) times daily.   levothyroxine  50 MCG tablet Commonly  known as: SYNTHROID  Take 50 mcg by mouth every morning.   loperamide  2 MG tablet Commonly known as: Imodium  A-D Take 1 tablet (2 mg total) by mouth 4 (four) times daily as needed for diarrhea or loose stools.   LORazepam  2 MG tablet Commonly known as: ATIVAN  Take 2 mg by mouth every evening. And as needed   metoprolol  tartrate 25 MG tablet Commonly known as: LOPRESSOR  Take 0.5 tablets (12.5 mg total) by mouth 2 (two) times daily. What changed: how much to take   montelukast  10 MG tablet Commonly known as: SINGULAIR  Take 1 tablet (10 mg total) by mouth at bedtime.   nitroGLYCERIN  0.4 MG SL tablet Commonly known as: Nitrostat  Place 1 tablet (0.4 mg total) under the tongue every 5 (five) minutes as needed for chest pain.   ondansetron  8 MG disintegrating tablet Commonly known as: ZOFRAN -ODT Take 1 tablet (8 mg total) by mouth every 8 (eight) hours as needed for nausea or vomiting.   traZODone  50 MG tablet Commonly known as: DESYREL  Take 50 mg by mouth at bedtime.         Outstanding Labs/Studies   Duration of Discharge Encounter: APP Time: 20 minutes   Signed, Jon Nat Hails, PA 01/23/2024, 7:48 AM

## 2024-01-22 NOTE — Progress Notes (Signed)
 Rounding Note   Patient Name: [name] Date of Encounter: 01/22/2024  [facility] Cardiologist: Lonni Cash, MD   Subjective Pt feels tired today, no chest pain.  Scheduled Meds:  adagrasib   400 mg Oral BID   aspirin   81 mg Oral Daily   budesonide -glycopyrrolate -formoterol   2 puff Inhalation BID   buPROPion   150 mg Oral QHS   buPROPion   300 mg Oral Daily   clopidogrel   75 mg Oral Q breakfast   levothyroxine   50 mcg Oral q morning   LORazepam   2 mg Oral QPM   montelukast   10 mg Oral QHS   sodium chloride  flush  3 mL Intravenous Q12H   traZODone   50 mg Oral QHS   Continuous Infusions:  sodium chloride      PRN Meds: sodium chloride , acetaminophen , albuterol , fentaNYL  (SUBLIMAZE ) injection, nitroGLYCERIN , ondansetron  (ZOFRAN ) IV, oxyCODONE -acetaminophen , sodium chloride  flush   Vital Signs  Vitals:   01/21/24 2010 01/21/24 2354 01/22/24 0437 01/22/24 0912  BP: 125/74 105/68 122/79 113/71  Pulse:  83 91 81  Resp: 18 18 18 19   Temp: 98.7 F (37.1 C) 98.4 F (36.9 C) 98.5 F (36.9 C) 98.7 F (37.1 C)  TempSrc: Oral Oral Oral Oral  SpO2: 100% 96% 97% 99%  Weight:      Height:        Intake/Output Summary (Last 24 hours) at 01/22/2024 1035 Last data filed at 01/22/2024 0436 Gross per 24 hour  Intake 510 ml  Output 1200 ml  Net -690 ml      01/21/2024    8:27 AM 01/13/2024    3:38 PM 12/14/2023    3:34 PM  Last 3 Weights  Weight (lbs) 161 lb 184 lb 181 lb 12.8 oz  Weight (kg) 73.029 kg 83.462 kg 82.464 kg      Telemetry SR in the 60-80s - Personally Reviewed  ECG  No new tracings - Personally Reviewed  Physical Exam  GEN: No acute distress.   Neck: No JVD Cardiac: RRR, no murmurs, rubs, or gallops.  Respiratory: Clear to auscultation bilaterally. GI: Soft, nontender, non-distended  MS: No edema; No deformity. Neuro:  Nonfocal  Psych: Normal affect  Right groin with bruising, do not appreciate hematoma  Labs High  Sensitivity Troponin:  No results for input(s): TROPONINIHS in the last 720 hours.   Chemistry Recent Labs  Lab 01/22/24 0217 01/22/24 0816  NA 138 135  K 4.6 4.7  CL 105 102  CO2 23 20*  GLUCOSE 104* 154*  BUN 23 25*  CREATININE 1.50* 1.54*  CALCIUM  8.6* 9.0  GFRNONAA 37* 36*  ANIONGAP 10 13    Lipids  Recent Labs  Lab 01/20/24 0941  CHOL 329*  TRIG 291*  HDL 50  LABVLDL 60*  LDLCALC 219*  CHOLHDL 6.6*    Hematology Recent Labs  Lab 01/22/24 0217 01/22/24 0816  WBC 8.6 11.3*  RBC 3.20* 3.42*  HGB 9.7* 10.4*  HCT 29.9* 32.0*  MCV 93.4 93.6  MCH 30.3 30.4  MCHC 32.4 32.5  RDW 13.3 13.4  PLT 199 273   Thyroid  No results for input(s): TSH, FREET4 in the last 168 hours.  BNPNo results for input(s): BNP, PROBNP in the last 168 hours.  DDimer No results for input(s): DDIMER in the last 168 hours.   Radiology  CARDIAC CATHETERIZATION Result Date: 01/21/2024   Prox RCA to Mid RCA lesion is 99% stenosed.   Prox RCA lesion is 70% stenosed.   Mid RCA to [facility] Iv  RCA lesion is 80% stenosed.   3rd Mrg lesion is 50% stenosed.   RPDA lesion is 70% stenosed.   Mid LAD lesion is 80% stenosed.   A drug-eluting stent was successfully placed using a STENT SYNERGY XD 2.25X24.   A drug-eluting stent was successfully placed using a STENT SYNERGY XD [id number].   Drug-coated balloon angioplasty was performed using a BALL DC AGENT 2.50X15.   Post intervention, there is a 0% residual stenosis.   Post intervention, there is a 0% residual stenosis.   Post intervention, there is a 0% residual stenosis.   Post intervention, there is a 0% residual stenosis. Severe restenosis mid LAD stented segment. Successful PTCA with cutting balloon angioplasty and drug eluting balloon angioplasty of the mid LAD stented segment Mild non-obstructive disease in the distal OM branch Large dominant RCA with severe stenosis in the proximal vessel, severe stenosis in the mid stented segment and severe stenosis  in distal vessel beyond the old stent. Successful PTCA/DES x 1 proximal and mid RCA Normal LV systolic function Recommendations: DAPT with ASA and Plavix  for a year.    Cardiac Studies  LHC 01/21/24:   Prox RCA to Mid RCA lesion is 99% stenosed.   Prox RCA lesion is 70% stenosed.   Mid RCA to Dist RCA lesion is 80% stenosed.   3rd Mrg lesion is 50% stenosed.   RPDA lesion is 70% stenosed.   Mid LAD lesion is 80% stenosed.   A drug-eluting stent was successfully placed using a STENT SYNERGY XD 2.25X24.   A drug-eluting stent was successfully placed using a STENT SYNERGY XD [id number].   Drug-coated balloon angioplasty was performed using a BALL DC AGENT 2.50X15.   Post intervention, there is a 0% residual stenosis.   Post intervention, there is a 0% residual stenosis.   Post intervention, there is a 0% residual stenosis.   Post intervention, there is a 0% residual stenosis.   Severe restenosis mid LAD stented segment.  Successful PTCA with cutting balloon angioplasty and drug eluting balloon angioplasty of the mid LAD stented segment Mild non-obstructive disease in the distal OM branch Large dominant RCA with severe stenosis in the proximal vessel, severe stenosis in the mid stented segment and severe stenosis in distal vessel beyond the old stent.  Successful PTCA/DES x 1 proximal and mid RCA Normal LV systolic function   Recommendations: DAPT with ASA and Plavix  for a year.   Patient Profile    68 y.o. female with history of asthma, depression, GERD, fibromyalgia, advanced lung cancer, PVCs, PACs, SVT, and CAD.  She has chronic respiratory failure on home O2.  Assessment & Plan   CAD LHC yesterday with severe in-stent restenosis in the LAD and RCA.  She was treated with overlapping stents in the RCA as well as DES-mid LAD. She was continued on aspirin  and Plavix . May consider checking to see if she is a high metabolizer for Plavix  in the outpatient setting.    Paroxysmal SVT,  PAC, PVC - telemetry without significant ectopy - home lopressor  not yet resumed   Hyperlipidemia with LDL goal < 55 01/20/2024: Cholesterol, Total 329; HDL 50; LDL Chol Calc (NIH) 219; Triglycerides 291 Intolerant to statins Needs PCSK9i   Lung cancer - stage IV NSCLC Chronic respiratory failure COPD - on home O2       For questions or updates, please contact [facility] Please consult [email or web address] for contact info under       Signed, Jon Nat Hails,  PA  01/22/2024, 10:35 AM

## 2024-01-23 ENCOUNTER — Encounter: Payer: Self-pay | Admitting: Internal Medicine

## 2024-01-23 ENCOUNTER — Other Ambulatory Visit (HOSPITAL_COMMUNITY): Payer: Self-pay

## 2024-01-23 DIAGNOSIS — I25110 Atherosclerotic heart disease of native coronary artery with unstable angina pectoris: Secondary | ICD-10-CM | POA: Diagnosis not present

## 2024-01-23 DIAGNOSIS — I200 Unstable angina: Secondary | ICD-10-CM | POA: Diagnosis not present

## 2024-01-23 MED ORDER — ASPIRIN 81 MG PO TBEC
81.0000 mg | DELAYED_RELEASE_TABLET | Freq: Every day | ORAL | 2 refills | Status: AC
  Filled 2024-01-23: qty 30, 30d supply, fill #0

## 2024-01-23 MED ORDER — CLOPIDOGREL BISULFATE 75 MG PO TABS
75.0000 mg | ORAL_TABLET | Freq: Every day | ORAL | 3 refills | Status: DC
  Filled 2024-01-23: qty 90, 90d supply, fill #0

## 2024-01-23 NOTE — Plan of Care (Incomplete)
 PT d/c to home. IV removed, all belongings returned. AVS reviewed with all follow up questions answered. NO other needs voiced at this time. BP (!) 142/72 (BP Location: Left Arm)   Pulse 87   Temp 98.6 F (37 C) (Oral)   Resp 19   Ht 5' 7 (1.702 m)   Wt 73 kg   SpO2 96%   BMI 25.22 kg/m  [name] 01/23/24

## 2024-01-24 ENCOUNTER — Other Ambulatory Visit (HOSPITAL_COMMUNITY): Payer: Self-pay

## 2024-01-24 ENCOUNTER — Telehealth (HOSPITAL_COMMUNITY): Payer: Self-pay

## 2024-01-24 NOTE — Telephone Encounter (Signed)
 Attempted to call patient in regards to Cardiac Rehab - LM on VM

## 2024-01-26 ENCOUNTER — Other Ambulatory Visit (HOSPITAL_COMMUNITY): Payer: Self-pay

## 2024-01-28 ENCOUNTER — Other Ambulatory Visit (HOSPITAL_COMMUNITY): Payer: Self-pay

## 2024-01-31 ENCOUNTER — Other Ambulatory Visit: Payer: Self-pay | Admitting: Internal Medicine

## 2024-01-31 DIAGNOSIS — C3492 Malignant neoplasm of unspecified part of left bronchus or lung: Secondary | ICD-10-CM

## 2024-02-01 ENCOUNTER — Other Ambulatory Visit: Payer: Self-pay

## 2024-02-01 ENCOUNTER — Encounter (HOSPITAL_COMMUNITY): Payer: Self-pay

## 2024-02-01 ENCOUNTER — Ambulatory Visit (HOSPITAL_COMMUNITY)
Admission: RE | Admit: 2024-02-01 | Discharge: 2024-02-01 | Disposition: A | Discharge status: Home / Self Care | Source: Ambulatory Visit | Attending: Physician Assistant | Admitting: Physician Assistant

## 2024-02-01 ENCOUNTER — Inpatient Hospital Stay: Attending: Physician Assistant

## 2024-02-01 DIAGNOSIS — J9611 Chronic respiratory failure with hypoxia: Secondary | ICD-10-CM | POA: Insufficient documentation

## 2024-02-01 DIAGNOSIS — C3491 Malignant neoplasm of unspecified part of right bronchus or lung: Secondary | ICD-10-CM | POA: Diagnosis not present

## 2024-02-01 DIAGNOSIS — Z9981 Dependence on supplemental oxygen: Secondary | ICD-10-CM | POA: Diagnosis not present

## 2024-02-01 DIAGNOSIS — C3492 Malignant neoplasm of unspecified part of left bronchus or lung: Secondary | ICD-10-CM | POA: Insufficient documentation

## 2024-02-01 DIAGNOSIS — N289 Disorder of kidney and ureter, unspecified: Secondary | ICD-10-CM | POA: Diagnosis not present

## 2024-02-01 DIAGNOSIS — M51379 Other intervertebral disc degeneration, lumbosacral region without mention of lumbar back pain or lower extremity pain: Secondary | ICD-10-CM | POA: Insufficient documentation

## 2024-02-01 DIAGNOSIS — I700 Atherosclerosis of aorta: Secondary | ICD-10-CM | POA: Diagnosis not present

## 2024-02-01 DIAGNOSIS — Z9221 Personal history of antineoplastic chemotherapy: Secondary | ICD-10-CM | POA: Diagnosis not present

## 2024-02-01 DIAGNOSIS — J449 Chronic obstructive pulmonary disease, unspecified: Secondary | ICD-10-CM | POA: Diagnosis not present

## 2024-02-01 LAB — CMP (CANCER CENTER ONLY)
ALT: 12 U/L (ref 0–44)
AST: 18 U/L (ref 15–41)
Albumin: 4.5 g/dL (ref 3.5–5.0)
Alkaline Phosphatase: 82 U/L (ref 38–126)
Anion gap: 9 (ref 5–15)
BUN: 21 mg/dL (ref 8–23)
CO2: 29 mmol/L (ref 22–32)
Calcium: 9.6 mg/dL (ref 8.9–10.3)
Chloride: 103 mmol/L (ref 98–111)
Creatinine: 1.55 mg/dL — ABNORMAL HIGH (ref 0.44–1.00)
GFR, Estimated: 36 mL/min — ABNORMAL LOW (ref >60)
Glucose, Bld: 102 mg/dL — ABNORMAL HIGH (ref 70–99)
Potassium: 4.9 mmol/L (ref 3.5–5.1)
Sodium: 140 mmol/L (ref 135–145)
Total Bilirubin: 0.7 mg/dL (ref 0.0–1.2)
Total Protein: 7.2 g/dL (ref 6.5–8.1)

## 2024-02-01 LAB — CBC WITH DIFFERENTIAL (CANCER CENTER ONLY)
Abs Immature Granulocytes: 0.03 K/uL (ref 0.00–0.07)
Basophils Absolute: 0.1 K/uL (ref 0.0–0.1)
Basophils Relative: 1 %
Eosinophils Absolute: 0.4 K/uL (ref 0.0–0.5)
Eosinophils Relative: 5 %
HCT: 30.2 % — ABNORMAL LOW (ref 36.0–46.0)
Hemoglobin: 9.9 g/dL — ABNORMAL LOW (ref 12.0–15.0)
Immature Granulocytes: 0 %
Lymphocytes Relative: 18 %
Lymphs Abs: 1.3 K/uL (ref 0.7–4.0)
MCH: 31 pg (ref 26.0–34.0)
MCHC: 32.8 g/dL (ref 30.0–36.0)
MCV: 94.7 fL (ref 80.0–100.0)
Monocytes Absolute: 0.6 K/uL (ref 0.1–1.0)
Monocytes Relative: 8 %
Neutro Abs: 5 K/uL (ref 1.7–7.7)
Neutrophils Relative %: 68 %
Platelet Count: 281 K/uL (ref 150–400)
RBC: 3.19 MIL/uL — ABNORMAL LOW (ref 3.87–5.11)
RDW: 13.4 % (ref 11.5–15.5)
WBC Count: 7.3 K/uL (ref 4.0–10.5)
nRBC: 0 % (ref 0.0–0.2)

## 2024-02-01 NOTE — Progress Notes (Signed)
 Clinical Intervention Note  Clinical Intervention Notes: Patient reported starting clopidogrel . No DDIs identified with Krazati    Clinical Intervention Outcomes: Prevention of an adverse drug event   [employer]vertising Account [employer]anner

## 2024-02-01 NOTE — Progress Notes (Signed)
 Specialty Pharmacy Refill Coordination Note  [name] is a 68 y.o. female contacted today regarding refills of specialty medication(s) Adagrasib  (Krazati )   Patient requested Delivery   Delivery date: 02/02/24   Verified address: [address]   [name] [phone]   Medication will be filled on: 02/01/24

## 2024-02-03 NOTE — Progress Notes (Unsigned)
 Cardiology Office Note    Date:  02/04/2024  ID:  Mahaley, Schwering 06-03-1955, MRN [id number] PCP:  [name], NP  Cardiologist:  Lonni Cash, MD  Electrophysiologist:  None   Chief Complaint: Follow up for CAD   History of Present Illness: .   [name] is a 68 y.o. female with visit-pertinent history of asthma, depression, GERD, fibromyalgia, advanced lung cancer, PVCs, PACs, SVT and CAD.   LHC in 04/2008 demonstrating 80% stenosis in the mid RCA treated with a Xience DES.  Patient was noted to have possible SVT during cardiac rehab in 05/2008.  Relook LHC in 05/2008 indicated proximal RCA 25%, mid RCA stent patent.  Holter monitor in 03/2009 indicated normal sinus rhythm with PACs and PVCs.  Stress Myoview  in 10/2012 showed a reversible basal inferior perfusion defect, described class III angina on her follow-up visit.  Cardiac cath on 12/06/2012 with severe stenosis in the mid RCA just before the old stent which also had a restenosis treated entire segment with a 2.75 x 28 mm Promus Premier DES.  Following patient had recurrent chest pain and was seen in the office on 05/05/2013.  Cardiac cath on 05/08/2013 with severe stenosis of the mid LAD, a 2.5 x 16 mm Promus DES was deployed in the mid LAD.  She also had complaints of palpitations and was started on Lopressor  12.5 mg twice daily.   Cardiac cath in June 2017 with patent stents in the LAD and RCA.  LVEF was normal.  She underwent a nuclear stress test in 2018 in setting of exertional dyspnea that showed no evidence of ischemia or prior infarction.  EF was noted be mildly decreased at 48%.  Echo in August 2018 showed normal LVEF at 55 to 60%, G1 DD was noted, wall motion was normal.  No significant valvular disease was noted.   In December 2019 she was diagnosed with small cell lung cancer and had undergone chemotherapy.  Nuclear stress test in May 2022 with no ischemia.  Cardiac monitor in June 2022 with sinus with 2 short  runs of SVT, longest 8 beats, echo in June 2022 with LVEF 55 to 60%.  Cardiac MRI in July 2022 with large epicardial adipose layer, LVEF 56%.  Basal inferior septal scar.   Patient was seen in clinic by cardiology on 07/21/2021 by Dr. Cash.  She remained stable from a cardiac standpoint.  Patient was seen in clinic on 01/13/2024 for overdue follow-up.  She reported that she been experiencing increased episodes of chest discomfort in recent months, noted that she would have episodes daily with exertion.  She also reported having worsening shortness of breath thought to be related to COPD however feels this has been worse recently associated with her chest pain.  She noted that her pain was similar to her prior angina when she needed stenting.  She reported episodes of palpitations as well as episodes of bradycardia with heart rates in the 30s and 40s, was unaware of any significant palpitations or symptoms at the time.  Patient was scheduled for cardiac catheterization.  Cardiac catheterization on 01/21/2024 demonstrated severe restenosis in the mid LAD stented segment successfully treated with PTCA with Cutting Balloon angioplasty and DES using a 2.5 x 15 mm ball DC agent.  There was also severe in-stent restenosis and stenosis beyond prior stent in the RCA treated with PTCA and DES using 2.75 x 32 mm stent overlapping previously placed stent as well as 2.25 x 24  mm stent in the mid to distal RCA, patient with overlapping stents in the RCA.  Femoral approach was used, Perclose device failed and manual pressure was applied.  FemoStop was subsequently used.  Today she presents for follow up. She notes problems with ongoing fatigue and decreased activity tolerance however notes that she is no longer having a chest or back pain, notes that her breathing has somewhat improved post catheterization.  She remains on 2 L nasal cannula, notes baseline shortness of breath related to history of COPD and lung cancer.   She did have lab work completed earlier this week showing that her hemoglobin remains reduced following her catheterization.  She reports that her right groin site has been improving, notes that her palpitations have been well-controlled.  She denies any further episodes of low heart rates.  Labwork independently reviewed: 02/01/2024: Sodium 140, potassium 4.9, creatinine 1.55, AST 18, ALT 12, hemoglobin 9.9, hematocrit 30.2 ROS: .   Today she denies chest pain, lower extremity edema,  melena, hematuria, hemoptysis, diaphoresis, syncope, orthopnea, and PND.  All other systems are reviewed and otherwise negative. Studies Reviewed: SABRA   EKG:  EKG is ordered today, personally reviewed, demonstrating  EKG Interpretation Date/Time:  Friday February 04 2024 14:21:40 EST Ventricular Rate:  99 PR Interval:  148 QRS Duration:  68 QT Interval:  346 QTC Calculation: 444 R Axis:   52  Text Interpretation: Normal sinus rhythm Normal ECG When compared with ECG of 22-Jan-2024 04:42, No significant change was found Confirmed by Shali Vesey [phone]) on 02/04/2024 2:33:36 PM   CV Studies: Cardiac studies reviewed are outlined and summarized above. Otherwise please see EMR for full report. Cardiac Studies & Procedures   ______________________________________________________________________________________________ CARDIAC CATHETERIZATION  CARDIAC CATHETERIZATION 01/21/2024  Conclusion   Prox RCA to Mid RCA lesion is 99% stenosed.   Prox RCA lesion is 70% stenosed.   Mid RCA to Dist RCA lesion is 80% stenosed.   3rd Mrg lesion is 50% stenosed.   RPDA lesion is 70% stenosed.   Mid LAD lesion is 80% stenosed.   A drug-eluting stent was successfully placed using a STENT SYNERGY XD 2.25X24.   A drug-eluting stent was successfully placed using a STENT SYNERGY XD [id number].   Drug-coated balloon angioplasty was performed using a BALL DC AGENT 2.50X15.   Post intervention, there is a 0% residual stenosis.   Post  intervention, there is a 0% residual stenosis.   Post intervention, there is a 0% residual stenosis.   Post intervention, there is a 0% residual stenosis.  Severe restenosis mid LAD stented segment. Successful PTCA with cutting balloon angioplasty and drug eluting balloon angioplasty of the mid LAD stented segment Mild non-obstructive disease in the distal OM branch Large dominant RCA with severe stenosis in the proximal vessel, severe stenosis in the mid stented segment and severe stenosis in distal vessel beyond the old stent. Successful PTCA/DES x 1 proximal and mid RCA Normal LV systolic function  Recommendations: DAPT with ASA and Plavix  for a year.  Findings Coronary Findings Diagnostic  Dominance: Right  Left Anterior Descending Vessel is large. Mid LAD lesion is 80% stenosed. The lesion was previously treated using a drug eluting stent over 2 years ago.  Left Circumflex  Third Obtuse Marginal Branch 3rd Mrg lesion is 50% stenosed.  Right Coronary Artery Vessel is large. Prox RCA lesion is 70% stenosed. Prox RCA to Mid RCA lesion is 99% stenosed. The lesion was previously treated using a drug eluting stent over  2 years ago. Mid RCA to Dist RCA lesion is 80% stenosed.  Right Posterior Descending Artery RPDA lesion is 70% stenosed.  Intervention  Mid LAD lesion Angioplasty CATH [place]ISTA GUIDE 6FR XBLD 3.5 guide catheter was inserted. WIRE ASAHI PROWATER 180CM guidewire used to cross lesion. Drug-coated balloon angioplasty was performed using a BALL DC AGENT 2.50X15.  A second ballloon was used, using a cutting  BALLOON [place] 2.50X10. Post-Intervention Lesion Assessment The intervention was successful. Pre-interventional TIMI flow is 3. Post-intervention TIMI flow is 3. No complications occurred at this lesion. There is a 0% residual stenosis post intervention.  Prox RCA lesion Stent (Also treats lesions: Prox RCA to Mid RCA) CATHETER LAUNCHER 6FR JR4 SH guide  catheter was inserted. Lesion crossed with guidewire using a WIRE ASAHI PROWATER 180CM. Pre-stent angioplasty was performed using a BALLOON EMERGE MR 2.0X15. A drug-eluting stent was successfully placed using a STENT SYNERGY XD [id number]. Stent overlaps previously placed stent. Post-stent angioplasty was performed using a BALLOON [place] EMERGE MR 3.0X20. Post-Intervention Lesion Assessment The intervention was successful. Pre-interventional TIMI flow is 2. Post-intervention TIMI flow is 3. No complications occurred at this lesion. There is a 0% residual stenosis post intervention.  Prox RCA to Mid RCA lesion Stent (Also treats lesions: Prox RCA) See details in Prox RCA lesion. Post-Intervention Lesion Assessment The intervention was successful. Pre-interventional TIMI flow is 2. Post-intervention TIMI flow is 3. No complications occurred at this lesion. There is a 0% residual stenosis post intervention.  Mid RCA to Dist RCA lesion Stent CATHETER LAUNCHER 6FR JR4 SH guide catheter was inserted. Lesion crossed with guidewire using a WIRE ASAHI PROWATER 180CM. Pre-stent angioplasty was performed using a BALLOON EMERGE MR 2.0X15. A drug-eluting stent was successfully placed using a STENT SYNERGY XD 2.25X24. Post-stent angioplasty was performed using a BALLOON [place] EMERGE MR 2.5X15. Post-Intervention Lesion Assessment The intervention was successful. Pre-interventional TIMI flow is 2. Post-intervention TIMI flow is 3. No complications occurred at this lesion. There is a 0% residual stenosis post intervention.   CARDIAC CATHETERIZATION  CARDIAC CATHETERIZATION 08/07/2015  Conclusion  Mid RCA lesion, 20% stenosed. The lesion was previously treated with a stent (unknown type).  Ost RPDA lesion, 50% stenosed.  The left ventricular systolic function is normal.  Prox LAD to Mid LAD lesion, 10% stenosed. The lesion was previously treated with a stent (unknown type).  1. Double vessel coronary artery  disease with continued patency of the stented segments in the LAD and RCA 2. Preserved LV systolic function  Findings Coronary Findings Diagnostic  Dominance: Right  Left Anterior Descending The lesion was previously treated with a stent (unknown type). Minimal restenosis, widely patent  Left Circumflex The vessel exhibits minimal luminal irregularities.  Right Coronary Artery The lesion was previously treated with a stent (unknown type).  Right Posterior Descending Artery  Intervention  No interventions have been documented.   STRESS TESTS  MYOCARDIAL PERFUSION IMAGING 07/12/2020  Interpretation Summary  The left ventricular ejection fraction is normal (55-65%).  Nuclear stress EF: 57%.  No T wave inversion was noted during stress.  There was no ST segment deviation noted during stress.  This is a low risk study.  No reversible ischemia. LVEF 57% with normal wall motion. This is a low risk study. No prior for comparison.   ECHOCARDIOGRAM  ECHOCARDIOGRAM LIMITED 08/14/2020  Narrative ECHOCARDIOGRAM LIMITED REPORT    Patient Name:   [name] Date of Exam: 08/14/2020 Medical Rec #:  [id number]     Height:  67.0 in Accession #:    [id number]    Weight:       167.0 lb Date of Birth:  1955/03/03     BSA:          1.873 m Patient Age:    64 years      BP:           131/86 mmHg Patient Gender: F             HR:           75 bpm. Exam Location:  [address]  Procedure: Limited Echo, Cardiac Doppler, Limited Color Doppler and Strain Analysis  Indications:    I31.3 Pericardia Effusion  History:        Patient has prior history of Echocardiogram examinations, most recent 07/12/2020. CAD, Lung Cancer, Arrythmias:SVT and PVC; Risk Factors:HLD.  Sonographer:    Waldo Guadalajara RCS Referring Phys: [id number] CHRISTOPHER D MCALHANY  IMPRESSIONS   1. Limited echo for pericardial effusion 2. Left ventricular ejection fraction, by estimation, is 55 to 60%. The  left ventricle has normal function. The left ventricle has no regional wall motion abnormalities. 3. There is normal pulmonary artery systolic pressure. 4. There is an anterior echo free space wtih mixed material, there appears to me to be an extracardiac echo free space, possiblyh fat pad and a smaller echo free space adjacent to the the pericardium with mixed material suggestive of a small focal pericardial effusion. 5. Consider cardiac MRI if warranted to further evalute this finding.  Comparison(s): No significant change from prior study. 07/12/2020: LVEF 60-65%, moderate pericardial effusion. I compared the images directly and discussed with Dr. Raford and she agrees that there is no change in the size of the anterior structures.  FINDINGS Left Ventricle: Left ventricular ejection fraction, by estimation, is 55 to 60%. The left ventricle has normal function. The left ventricle has no regional wall motion abnormalities. There is no left ventricular hypertrophy.  Right Ventricle: There is normal pulmonary artery systolic pressure. The tricuspid regurgitant velocity is 1.67 m/s, and with an assumed right atrial pressure of 3 mmHg, the estimated right ventricular systolic pressure is 14.2 mmHg.  Pericardium: There is no evidence of pericardial effusion.  LEFT VENTRICLE PLAX 2D LVIDd:         4.00 cm LVIDs:         2.90 cm LV PW:         1.10 cm LV IVS:        0.90 cm LVOT diam:     2.10 cm LVOT Area:     3.46 cm   RIGHT VENTRICLE RVSP:           14.2 mmHg  LEFT ATRIUM         Index      RIGHT ATRIUM LA diam:    3.10 cm 1.66 cm/m RA Pressure: 3.00 mmHg MITRAL VALVE               TRICUSPID VALVE MV Area (PHT): 3.20 cm    TR Peak grad:   11.2 mmHg MV Decel Time: 237 msec    TR Vmax:        167.00 cm/s MV E velocity: 81.00 cm/s  Estimated RAP:  3.00 mmHg MV A velocity: 98.10 cm/s  RVSP:           14.2 mmHg MV E/A ratio:  0.83 SHUNTS Systemic Diam: 2.10 cm  Vinie Maxcy  MD Electronically signed by Vinie Maxcy MD Signature  Date/Time: 08/14/2020/3:25:02 PM    Final    MONITORS  LONG TERM MONITOR (3-14 DAYS) 08/13/2020  Narrative Patch Wear Time:  3 days and 1 hours (2022-06-04T11:46:22-0400 to 2022-06-07T13:24:22-0400)  Sinus rhythm. (Patient had a min HR of 62 bpm, max HR of 169 bpm, and avg HR of 86 bpm. 2 Supraventricular Tachycardia runs occurred, the run with the fastest interval lasting 8 beats with a max rate of 169 bpm (avg 160 bpm); the run with the fastest interval was also the longest. Rare premature atrial contractions and rare premature ventricular contractions.     CARDIAC MRI  MR CARDIAC MORPHOLOGY W WO CONTRAST 09/16/2020  Narrative CLINICAL DATA:  ?Pericardial effusion vs fat pad  COMPARISON: Echocardiogram 07/12/20, 08/14/20  EXAM: CARDIAC MRI  TECHNIQUE: The patient was scanned on a 1.5 Tesla GE magnet. A dedicated cardiac coil was used. Functional imaging was done using Fiesta sequences. 2,3, and 4 chamber views were done to assess for RWMA's. Modified Simpson's rule using a short axis stack was used to calculate an ejection fraction on a dedicated work [employer]. The patient received 10mL GADAVIST  GADOBUTROL  1 MMOL/ML IV SOLN. After 10 minutes inversion recovery sequences were used to assess for infiltration and scar tissue.  This examination is tailored for evaluation cardiac anatomy and function and provides very limited assessment of noncardiac structures, which are accordingly not evaluated during interpretation. If there is clinical concern for extracardiac pathology, further evaluation with CT imaging should be considered.  FINDINGS: LEFT VENTRICLE:  Normal left ventricular chamber size.  Borderline left ventricular wall thickness, 10 mm maximal wall thickness.  Normal left ventricular systolic function.  LVEF = 56%  Mild hypokinesis of the inferior wall.  No myocardial edema,  T2 = 48 msec.  Normal first pass perfusion.  There is post contrast delayed myocardial enhancement. Delayed myocardial enhancement in the subendocardium and midmyocardium of the basal inferoseptal and inferior wall, as well as the mid and apical inferior wall subendocardium and mid-myocardium. These areas of delayed enhancement are >50% of the myocardial thickness and are nearly transmural, consistent with prior infarct with no viability.  Normal T1 myocardial nulling kinetics suggest against a diagnosis of cardiac amyloidosis.  ECV = 27%, normal range.  RIGHT VENTRICLE:  Normal right ventricular chamber size.  Normal right ventricular wall thickness.  Normal right ventricular systolic function.  RVEF = 61%  There are no regional wall motion abnormalities.  No post contrast delayed myocardial enhancement.  ATRIA:  Normal left atrial size.  Normal right atrial size.  VALVES:  No significant valvular abnormalities.  Tricuspid aortic valve.  PERICARDIUM:  Normal pericardium.  No pericardial effusion.  There is a large epicardial fat layer.  OTHER: No significant extracardiac findings.  MEASUREMENTS: Qp/Qs 1.5  Left ventricle:  LV Female  LV EF: 56% (Normal 56-78%)  Absolute volumes:  LV EDV: 63mL (Normal 52-141 mL)  LV ESV: 27mL (Normal 13-51 mL)  LV SV: 35mL (Normal 33-97 mL)  CO: 2.8L/min (Normal 2.7-6.0 L/min)  Indexed volumes:  LV EDV: 34mL/sq-m (Normal 41-81 mL/sq-m)  LV ESV: 26mL/sq-m (Normal 12-21 mL/sq-m)  LV SV: 16mL/sq-m (Normal 26-56 mL/sq-m)  CI: 1.5L/min/sq-m (Normal 1.8-3.8 L/min/sq-m), query accuracy due to small cavity size and slice selection.  Right ventricle:  RV female  RV EF: 61% (Normal 47-80%)  Absolute volumes:  RV EDV: 47 mL (Normal 58-154 mL)  RV ESV: 18 mL (Normal 12-68 mL)  RV SV: 28 mL (Normal 35-98 mL)  CO: 2.3 L/min (Normal  2.7-6 L/min)  Indexed volumes:  RV EDV: 25 ML/sq-m (Normal 48-87  mL/sq-m)  RV ESV: 10 mL/sq-m (Normal 11-28 mL/sq-m)  RV SV: 15 mL/sq-m (Normal 27-57 mL/sq-m)  CI: 1.2 L/min/sq-m (Normal 1.8-3.8 L/min/sq-m)  IMPRESSION: 1.  Large epicardial adipose layer.  No pericardial effusion.  2. Normal left ventricular chamber size with normal systolic function, LVEF 56%. Mild inferior wall hypokinesis from base to apex. Borderline increased wall thickness.  3. Near transmural delayed myocardial enhancement in the basal inferoseptum and inferior wall from base to apex, consistent with prior infarct with no viability in this region.  4.  Normal right ventricular chamber size and function.  RVEF 61%.  5. Calculated Qp/Qs 1.5, however right heart size is normal. No definite shunts identified on this study.   Electronically Signed By: Gayatri  Acharya On: 09/20/2020 16:45   ______________________________________________________________________________________________       Current Reported Medications:.    Current Meds  Medication Sig   adagrasib  (KRAZATI ) 200 MG tablet Take 2 tablets (400 mg total) by mouth 2 (two) times daily.   albuterol  (PROVENTIL ) (5 MG/ML) 0.5% nebulizer solution Take 2.5 mg by nebulization every 6 (six) hours as needed for wheezing.   albuterol  (VENTOLIN  HFA) 108 (90 Base) MCG/ACT inhaler Inhale 1 puff into the lungs every 6 (six) hours as needed (wheezing/shortness of breath.).   Artificial Tear Ointment (DRY EYES OP) Place 1 drop into both eyes daily as needed (dry eyes).   aspirin  EC 81 MG tablet Take 1 tablet (81 mg total) by mouth daily. Swallow whole.   BELSOMRA  20 MG TABS Take 1 tablet by mouth at bedtime as needed.   budesonide -glycopyrrolate -formoterol  (BREZTRI  AEROSPHERE) 160-9-4.8 MCG/ACT AERO inhaler Inhale 2 puffs into the lungs in the morning and at bedtime.   buPROPion  (WELLBUTRIN  XL) 150 MG 24 hr tablet Take 1-2 tablets by mouth See admin instructions. Patient takes 2 tablets (300mg ) in the morning and 1 tablet  (150mg ) in the evening   clopidogrel  (PLAVIX ) 75 MG tablet Take 1 tablet (75 mg total) by mouth daily with breakfast.   ferrous sulfate  (FEROSUL) 325 (65 FE) MG tablet TAKE 1 TABLET(325 MG) BY MOUTH DAILY WITH BREAKFAST   HYDROcodone -acetaminophen  (NORCO) 7.5-325 MG tablet Take 1 tablet by mouth every 6 (six) hours as needed.   levothyroxine  (SYNTHROID ) 50 MCG tablet Take 50 mcg by mouth every morning.   LORazepam  (ATIVAN ) 2 MG tablet Take 2 mg by mouth every evening. And as needed   metoprolol  tartrate (LOPRESSOR ) 25 MG tablet Take 0.5 tablets (12.5 mg total) by mouth 2 (two) times daily. (Patient taking differently: Take 25 mg by mouth 2 (two) times daily.)   montelukast  (SINGULAIR ) 10 MG tablet Take 1 tablet (10 mg total) by mouth at bedtime.   nitroGLYCERIN  (NITROSTAT ) 0.4 MG SL tablet Place 1 tablet (0.4 mg total) under the tongue every 5 (five) minutes as needed for chest pain.   ondansetron  (ZOFRAN -ODT) 8 MG disintegrating tablet Take 1 tablet (8 mg total) by mouth every 8 (eight) hours as needed for nausea or vomiting.   traZODone  (DESYREL ) 50 MG tablet Take 50 mg by mouth at bedtime.   Physical Exam:    VS:  BP 114/78 (BP Location: Right Arm, Patient Position: Sitting, Cuff Size: Normal)   Pulse 99   Ht 5' 7.5 (1.715 m)   Wt 179 lb (81.2 kg)   BMI 27.62 kg/m    Wt Readings from Last 3 Encounters:  02/04/24 179 lb (81.2 kg)  01/21/24 161  lb (73 kg)  01/13/24 184 lb (83.5 kg)    GEN: Well nourished, well developed in no acute distress NECK: No JVD; No carotid bruits CARDIAC: RRR, no murmurs, rubs, gallops RESPIRATORY:  Diminished bilaterally  ABDOMEN: Soft, non-tender, non-distended EXTREMITIES:  No edema; No acute deformity     Asessement and Plan:.    CAD: LHC in 04/2008 demonstrating 80% stenosis in the mid RCA treated with a Xience DES.  Cardiac cath on 12/06/2012 with severe stenosis in the mid RCA just before the old stent which also had a restenosis treated entire  segment with a 2.75 x 28 mm Promus Premier DES. In 2015 LHC with severe stenosis of the mid LAD, a 2.5 x 16 mm Promus DES was deployed in the mid LAD. Cardiac catheterization on 01/21/2024 demonstrated severe restenosis in the mid LAD stented segment successfully treated with PTCA with Cutting Balloon angioplasty and DES using a 2.5 x 15 mm ball DC agent.  There was also severe in-stent restenosis and stenosis beyond prior stent in the RCA treated with PTCA and DES using 2.75 x 32 mm stent overlapping previously placed stent as well as 2.25 x 24 mm stent in the mid to distal RCA, patient with overlapping stents in the RCA.  Femoral approach was used, Perclose device failed and manual pressure was applied.  FemoStop was subsequently used. Today she notes improvement in her chest pain, reports that following her catheterization this has resolved, notes that her breathing has also changed as well.  She does have some baseline shortness of breath however notes that this has somewhat improved post catheterization.  She remains on 2 L nasal cannula.  Her right groin site shows no evidence of hematoma, does have some healing bruising present.  CBC earlier this week shows that her hemoglobin remains reduced however stable.  This is regularly monitored by oncology.  Patient notes that she would be unable to complete cardiac rehab. Reviewed ED precautions.  Continue aspirin  81 mg daily, Plavix  75 mg daily and metoprolol  tartrate 25 mg twice daily.  Hyperlipidemia: Last lipid profile on 01/20/2024 indicated total cholesterol 329, HDL 50, LDL 219, triglycerides 291.  Patient intolerant to statins.  Patient has been approved for Repatha , she is agreeable to trialing the medication.  Will check fasting lipid profile in 6 to 8 weeks.  Palpitations: Patient with history of paroxysmal SVT, PACs and PVCs. Today she reports her palpitations have been well-controlled, notes that her heart rate at home has improved.  She denies any  episodes of low heart rates.  She does note some ongoing fatigue.  Could consider cardiac monitor if she notes recurrent palpitations and persistent fatigue.  Lung cancer: Patient diagnosed with stage IV non-small cell lung cancer, adenocarcinoma diagnosed in December 2019.  Initially on treatment of carboplatin , Alimta , and Keytruda .  Keytruda  discontinued after 2 cycles due to allergy , Alimta  discontinued due to worsening renal insufficiency.  Patient has been continued on Krazati  600 mg twice daily, has been stable in recent years.   COPD: Patient with severe COPD, followed by pulmonology.     Cardiac Rehabilitation Eligibility Assessment      Disposition: F/u with Dr. Verlin or Lawonda Pretlow, NP in 8 weeks or sooner if needed.   Signed, Kieara Schwark D Delfino Friesen, NP

## 2024-02-04 ENCOUNTER — Encounter: Payer: Self-pay | Admitting: Internal Medicine

## 2024-02-04 ENCOUNTER — Encounter: Payer: Self-pay | Admitting: Cardiology

## 2024-02-04 ENCOUNTER — Other Ambulatory Visit (HOSPITAL_COMMUNITY): Payer: Self-pay

## 2024-02-04 ENCOUNTER — Telehealth: Payer: Self-pay | Admitting: Pharmacy Technician

## 2024-02-04 ENCOUNTER — Ambulatory Visit: Attending: Cardiology | Admitting: Cardiology

## 2024-02-04 VITALS — BP 114/78 | HR 86 | Ht 67.5 in | Wt 179.0 lb

## 2024-02-04 DIAGNOSIS — J449 Chronic obstructive pulmonary disease, unspecified: Secondary | ICD-10-CM

## 2024-02-04 DIAGNOSIS — R002 Palpitations: Secondary | ICD-10-CM | POA: Diagnosis not present

## 2024-02-04 DIAGNOSIS — E782 Mixed hyperlipidemia: Secondary | ICD-10-CM | POA: Diagnosis not present

## 2024-02-04 DIAGNOSIS — C3490 Malignant neoplasm of unspecified part of unspecified bronchus or lung: Secondary | ICD-10-CM

## 2024-02-04 DIAGNOSIS — I25118 Atherosclerotic heart disease of native coronary artery with other forms of angina pectoris: Secondary | ICD-10-CM | POA: Diagnosis not present

## 2024-02-04 MED ORDER — CLOPIDOGREL BISULFATE 75 MG PO TABS: 75.0000 mg | ORAL_TABLET | Freq: Every day | ORAL | 3 refills | Status: AC

## 2024-02-04 MED ORDER — REPATHA SURECLICK 140 MG/ML ~~LOC~~ SOAJ: 140.0000 mg | SUBCUTANEOUS | 11 refills | Status: AC

## 2024-02-04 NOTE — Patient Instructions (Addendum)
 Medication Instructions:  START TAKING REPATHA  140 MG EVERY 2 WEEKS.  Lab Work: FASTING LIPID PANEL TO BE DONE IN 6-8 WEEKS.   Testing/Procedures: NONE  Follow-Up: At [facility], you and your health needs are our priority.  As part of our continuing mission to provide you with exceptional heart care, our providers are all part of one team.  This team includes your primary Cardiologist (physician) and Advanced Practice Providers or APPs (Physician Assistants and Nurse Practitioners) who all work together to provide you with the care you need, when you need it.  Your next appointment:   2 MONTHS  Provider:   Lonni Cash, MD

## 2024-02-04 NOTE — Telephone Encounter (Signed)
 Pharmacy Patient Advocate Encounter   Received notification from provider that prior authorization for repatha  is required/requested.   Insurance verification completed.   The patient is insured through [employer].   Per test claim: PA required; PA submitted to above mentioned insurance via Latent Key/confirmation #/EOC [id number] Status is pending   Pharmacy Patient Advocate Encounter  Received notification from [employer] that Prior Authorization for repatha  has been APPROVED from 02/04/24 to 03/01/25. Ran test claim, Copay is $0.00. This test claim was processed through [facility]- copay amounts may vary at other pharmacies due to pharmacy/plan contracts, or as the patient moves through the different stages of their insurance plan.   PA #/Case ID/Reference #: [id number]

## 2024-02-04 NOTE — Progress Notes (Deleted)
 [facility] OFFICE PROGRESS NOTE  Diana Suzen HERO, NP [address] [place] [address]  DIAGNOSIS: Stage IV (T1a, N0, M1 a) non-small cell lung cancer, adenocarcinoma presented with multifocal disease in both lungs including 3 nodules in the left lung as well as 2 nodules in the right lung diagnosed in December 2019.     Molecular studies showed: KRAS G12C BCOR N1425S-subclonal RAD21 amplification   PDL 1 expression 1%  PRIOR THERAPY:  1) Systemic chemotherapy with carboplatin  for AUC of 5, Alimta  500 mg/M2 and Keytruda  200 mg IV every 3 weeks.  First dose April 12, 2018. Status post 4 cycles. starting from cycle #3, Keytruda  was dropped and patient received treatment with Carboplatin  for an AUC of 5 and Alimta  500 mg/m2 IV every 3 weeks.  2) Systemic chemotherapy with Alimta  500 mg/m2 IV every 3 weeks. Status post 3 cycles of single agent Alimta . Most recent dose given on 08/23/2018.  The treatment is currently on hold secondary to renal insufficiency  CURRENT THERAPY: Krazati  600 mg BID, first dose on 02/20/22. Dose reduced to 400 mg BID starting on 03/10/22. Her treatment was on hold from 03/15/22-05/07/22.     INTERVAL HISTORY: [name] 68 y.o. female returns to the clinic today for a follow-up visit. The patient was last seen in clinic by 12/14/23.   The patient is currently on a reduced dose of treatment with Krazati . She tolerates this well without any adverse side effects.    In general the patient does have a lot of unusual symptoms and side effects that have been going on predating Krazati  and poor overall health due to her COPD. For example she had been having nausea and vomiting and intermittent diarrhea ongoing even while off treatment on observation for several years.   The diarrhea started after having her gallbladder removed.  She has not been reporting any unusual diarrhea recently. She did not report any unusual nausea. She uses sublingual  zofran  which is effective for her.   In the interval since being seen she was seen by cardiology for unstable angina she underwent a left heart cath on 01/21/2024.  She reports unstable generalized itching.  Her last appointment she told me that she had upcoming appoint with her PCP in a or talk about potential allergy  injections.  Her appetite and weight is ***  Due to her severe COPD, she is not as active. If she moves around, she has tachycardia even with minimal exertion. She is on supplemental oxygen at baseline 3-4 L. She recently saw Dr. Theophilus.    She denies any fever, chills, or night sweats.  Cough?  She denies any unusual chest or skin changes.  Denies any unusual headache or visual changes.  She recently had a restaging CT scan.  She is here today for evaluation and to review her scan results.  MEDICAL HISTORY: Past Medical History:  Diagnosis Date   Anemia    long time ago (12/06/2012)   Anxiety    Arthritis    right knee real bad; in my hands bad (12/06/2012)   Asthma    Celiac disease    Colon polyps    COPD (chronic obstructive pulmonary disease) (HCC)    Coronary artery disease    a. s/p Xience DES to [facility] 04/2008;  b. LHC 05/2008: Proximal RCA 25%, mid RCA stent patent.  c. Anormal nuc 2014 -> s/p LHC with severe mRCA stenosis s/p DES. d. Cath 04/2013 s/p DES to  LAD.  e. LHC 08/2015 was stable.   Depression    years ago (12/06/2012)   Fibromyalgia    Gallstones    GERD (gastroesophageal reflux disease)    H/O hiatal hernia    Hepatitis    not A, B, or C (12/06/2012)   Hyperlipidemia    intol to statins and other chol agents due to elevated LFTs   Hypertension    Hypothyroidism    IBS (irritable bowel syndrome)    Interstitial cystitis    Lung cancer (HCC) dx'd 01/2018   Lung nodules    Bilateral   Migraines    when I was younger (12/06/2012)   Pneumonia    PONV (postoperative nausea and vomiting)    Very bad   Premature atrial contractions     PVC's (premature ventricular contractions)    Sinus headache    SVT (supraventricular tachycardia)     ALLERGIES:  is allergic to ciprofloxacin, levofloxacin, statins, tricor  [fenofibrate ], clarithromycin, compazine  [prochlorperazine ], latex, sulfamethoxazole-trimethoprim, and codeine.  MEDICATIONS:  Current Outpatient Medications  Medication Sig Dispense Refill   adagrasib  (KRAZATI ) 200 MG tablet Take 2 tablets (400 mg total) by mouth 2 (two) times daily. 180 tablet 3   albuterol  (PROVENTIL ) (5 MG/ML) 0.5% nebulizer solution Take 2.5 mg by nebulization every 6 (six) hours as needed for wheezing.     albuterol  (VENTOLIN  HFA) 108 (90 Base) MCG/ACT inhaler Inhale 1 puff into the lungs every 6 (six) hours as needed (wheezing/shortness of breath.). 1 each 5   Artificial Tear Ointment (DRY EYES OP) Place 1 drop into both eyes daily as needed (dry eyes).     aspirin  EC 81 MG tablet Take 1 tablet (81 mg total) by mouth daily. Swallow whole. 150 tablet 2   BELSOMRA  20 MG TABS Take 1 tablet by mouth at bedtime as needed.     budesonide -glycopyrrolate -formoterol  (BREZTRI  AEROSPHERE) 160-9-4.8 MCG/ACT AERO inhaler Inhale 2 puffs into the lungs in the morning and at bedtime. 10.7 g 1   buPROPion  (WELLBUTRIN  XL) 150 MG 24 hr tablet Take 1-2 tablets by mouth See admin instructions. Patient takes 2 tablets (300mg ) in the morning and 1 tablet (150mg ) in the evening     clopidogrel  (PLAVIX ) 75 MG tablet Take 1 tablet (75 mg total) by mouth daily with breakfast. 90 tablet 3   ferrous sulfate  (FEROSUL) 325 (65 FE) MG tablet TAKE 1 TABLET(325 MG) BY MOUTH DAILY WITH BREAKFAST 30 tablet 2   HYDROcodone -acetaminophen  (NORCO) 7.5-325 MG tablet Take 1 tablet by mouth every 6 (six) hours as needed.     ipratropium-albuterol  (DUONEB) 0.5-2.5 (3) MG/3ML SOLN Inhale 3 mLs into the lungs every 6 (six) hours as needed (Asthma). (Patient not taking: Reported on 01/14/2024) 360 mL 3   levothyroxine  (SYNTHROID ) 50 MCG tablet  Take 50 mcg by mouth every morning.     loperamide  (IMODIUM  A-D) 2 MG tablet Take 1 tablet (2 mg total) by mouth 4 (four) times daily as needed for diarrhea or loose stools. (Patient not taking: Reported on 01/14/2024) 20 tablet 0   LORazepam  (ATIVAN ) 2 MG tablet Take 2 mg by mouth every evening. And as needed     metoprolol  tartrate (LOPRESSOR ) 25 MG tablet Take 0.5 tablets (12.5 mg total) by mouth 2 (two) times daily. (Patient taking differently: Take 25 mg by mouth 2 (two) times daily.) 90 tablet 3   montelukast  (SINGULAIR ) 10 MG tablet Take 1 tablet (10 mg total) by mouth at bedtime. 30 tablet 11   nitroGLYCERIN  (NITROSTAT )  0.4 MG SL tablet Place 1 tablet (0.4 mg total) under the tongue every 5 (five) minutes as needed for chest pain. 25 tablet 12   ondansetron  (ZOFRAN -ODT) 8 MG disintegrating tablet Take 1 tablet (8 mg total) by mouth every 8 (eight) hours as needed for nausea or vomiting. 30 tablet 2   traZODone  (DESYREL ) 50 MG tablet Take 50 mg by mouth at bedtime.     No current facility-administered medications for this visit.    SURGICAL HISTORY:  Past Surgical History:  Procedure Laterality Date   CARDIAC CATHETERIZATION  04/2008; 05/2008   CARDIAC CATHETERIZATION N/A 08/07/2015   Procedure: Left Heart Cath and Coronary Angiography;  Surgeon: Ozell Fell, MD;  Location: [facility] INVASIVE CV LAB;  Service: Cardiovascular;  Laterality: N/A;   CHOLECYSTECTOMY     CORONARY ANGIOPLASTY WITH STENT PLACEMENT  04/2008 12/06/2012   1 + 1 (12/06/2012)   CORONARY STENT INTERVENTION N/A 01/21/2024   Procedure: CORONARY STENT INTERVENTION;  Surgeon: Verlin Lonni BIRCH, MD;  Location: [facility] INVASIVE CV LAB;  Service: Cardiovascular;  Laterality: N/A;   CORONARY STENT PLACEMENT  05/08/2013   DES TO LAD      DR MCALHANY   IR IMAGING GUIDED PORT INSERTION  04/29/2018   LEFT HEART CATH AND CORONARY ANGIOGRAPHY N/A 01/21/2024   Procedure: LEFT HEART CATH AND CORONARY ANGIOGRAPHY;  Surgeon: Verlin Lonni BIRCH, MD;  Location: [facility] INVASIVE CV LAB;  Service: Cardiovascular;  Laterality: N/A;   LEFT HEART CATHETERIZATION WITH CORONARY ANGIOGRAM N/A 12/06/2012   Procedure: LEFT HEART CATHETERIZATION WITH CORONARY ANGIOGRAM;  Surgeon: Lonni BIRCH Verlin, MD;  Location: [facility] CATH LAB;  Service: Cardiovascular;  Laterality: N/A;   LEFT HEART CATHETERIZATION WITH CORONARY ANGIOGRAM N/A 05/08/2013   Procedure: LEFT HEART CATHETERIZATION WITH CORONARY ANGIOGRAM;  Surgeon: Lonni BIRCH Verlin, MD;  Location: [facility] CATH LAB;  Service: Cardiovascular;  Laterality: N/A;   LEFT HEART CATHETERIZATION WITH CORONARY ANGIOGRAM N/A 01/04/2014   Procedure: LEFT HEART CATHETERIZATION WITH CORONARY ANGIOGRAM;  Surgeon: Lonni BIRCH Verlin, MD;  Location: [facility] CATH LAB;  Service: Cardiovascular;  Laterality: N/A;   PERCUTANEOUS CORONARY STENT INTERVENTION (PCI-S)  12/06/2012   Procedure: PERCUTANEOUS CORONARY STENT INTERVENTION (PCI-S);  Surgeon: Lonni BIRCH Verlin, MD;  Location: [facility] CATH LAB;  Service: Cardiovascular;;   PERCUTANEOUS CORONARY STENT INTERVENTION (PCI-S)  05/08/2013   Procedure: PERCUTANEOUS CORONARY STENT INTERVENTION (PCI-S);  Surgeon: Lonni BIRCH Verlin, MD;  Location: [facility] CATH LAB;  Service: Cardiovascular;;  mid LAD    TUBAL LIGATION     VAGINAL HYSTERECTOMY     VIDEO BRONCHOSCOPY WITH ENDOBRONCHIAL NAVIGATION N/A 02/17/2018   Procedure: VIDEO BRONCHOSCOPY WITH ENDOBRONCHIAL NAVIGATION;  Surgeon: Kerrin Elspeth BROCKS, MD;  Location: [facility] OR;  Service: Thoracic;  Laterality: N/A;    REVIEW OF SYSTEMS:   Review of Systems  Constitutional: Negative for appetite change, chills, fatigue, fever and unexpected weight change.  HENT:   Negative for mouth sores, nosebleeds, sore throat and trouble swallowing.   Eyes: Negative for eye problems and icterus.  Respiratory: Negative for cough, hemoptysis, shortness of breath and wheezing.   Cardiovascular: Negative for chest pain and leg swelling.   Gastrointestinal: Negative for abdominal pain, constipation, diarrhea, nausea and vomiting.  Genitourinary: Negative for bladder incontinence, difficulty urinating, dysuria, frequency and hematuria.   Musculoskeletal: Negative for back pain, gait problem, neck pain and neck stiffness.  Skin: Negative for itching and rash.  Neurological: Negative for dizziness, extremity weakness, gait problem, headaches, light-headedness and seizures.  Hematological: Negative for adenopathy. Does  not bruise/bleed easily.  Psychiatric/Behavioral: Negative for confusion, depression and sleep disturbance. The patient is not nervous/anxious.     PHYSICAL EXAMINATION:  There were no vitals taken for this visit.  ECOG PERFORMANCE STATUS: {CHL ONC ECOG [id number]  Physical Exam  Constitutional: Oriented to person, place, and time and well-developed, well-nourished, and in no distress. No distress.  HENT:  Head: Normocephalic and atraumatic.  Mouth/Throat: Oropharynx is clear and moist. No oropharyngeal exudate.  Eyes: Conjunctivae are normal. Right eye exhibits no discharge. Left eye exhibits no discharge. No scleral icterus.  Neck: Normal range of motion. Neck supple.  Cardiovascular: Normal rate, regular rhythm, normal heart sounds and intact distal pulses.   Pulmonary/Chest: Effort normal and breath sounds normal. No respiratory distress. No wheezes. No rales.  Abdominal: Soft. Bowel sounds are normal. Exhibits no distension and no mass. There is no tenderness.  Musculoskeletal: Normal range of motion. Exhibits no edema.  Lymphadenopathy:    No cervical adenopathy.  Neurological: Alert and oriented to person, place, and time. Exhibits normal muscle tone. Gait normal. Coordination normal.  Skin: Skin is warm and dry. No rash noted. Not diaphoretic. No erythema. No pallor.  Psychiatric: Mood, memory and judgment normal.  Vitals reviewed.  LABORATORY DATA: Lab Results  Component Value Date   WBC 7.3  02/01/2024   HGB 9.9 (L) 02/01/2024   HCT 30.2 (L) 02/01/2024   MCV 94.7 02/01/2024   PLT 281 02/01/2024      Chemistry      Component Value Date/Time   NA 140 02/01/2024 1454   NA 140 01/14/2024 1406   K 4.9 02/01/2024 1454   CL 103 02/01/2024 1454   CO2 29 02/01/2024 1454   BUN 21 02/01/2024 1454   BUN 19 01/14/2024 1406   CREATININE 1.55 (H) 02/01/2024 1454   CREATININE 0.68 08/05/2015 1456      Component Value Date/Time   CALCIUM  9.6 02/01/2024 1454   ALKPHOS 82 02/01/2024 1454   AST 18 02/01/2024 1454   ALT 12 02/01/2024 1454   BILITOT 0.7 02/01/2024 1454       RADIOGRAPHIC STUDIES:  CARDIAC CATHETERIZATION Result Date: 01/21/2024   Prox RCA to Mid RCA lesion is 99% stenosed.   Prox RCA lesion is 70% stenosed.   Mid RCA to Dist RCA lesion is 80% stenosed.   3rd Mrg lesion is 50% stenosed.   RPDA lesion is 70% stenosed.   Mid LAD lesion is 80% stenosed.   A drug-eluting stent was successfully placed using a STENT SYNERGY XD 2.25X24.   A drug-eluting stent was successfully placed using a STENT SYNERGY XD [id number].   Drug-coated balloon angioplasty was performed using a BALL DC AGENT 2.50X15.   Post intervention, there is a 0% residual stenosis.   Post intervention, there is a 0% residual stenosis.   Post intervention, there is a 0% residual stenosis.   Post intervention, there is a 0% residual stenosis. Severe restenosis mid LAD stented segment. Successful PTCA with cutting balloon angioplasty and drug eluting balloon angioplasty of the mid LAD stented segment Mild non-obstructive disease in the distal OM branch Large dominant RCA with severe stenosis in the proximal vessel, severe stenosis in the mid stented segment and severe stenosis in distal vessel beyond the old stent. Successful PTCA/DES x 1 proximal and mid RCA Normal LV systolic function Recommendations: DAPT with ASA and Plavix  for a year.     ASSESSMENT/PLAN:  This is a very pleasant 69 year old Caucasian female  diagnosed with stage IV  non-small cell lung cancer, adenocarcinoma. She is negative for any actionable mutations and her PD-L1 expression is 1%. She was diagnosed in December 2019. She presented with bilateral pulmonary nodules.    She initially started treatment with carboplatin , Alimta , and Keytruda . She was status post 4 cycles but Keytruda  was discontinued after cycle #2 due to his significant skin rash. She then was treated with maintenance single agent Alimta  for 3 cycles. This has been on hold since June 2020 due to worsening renal insufficiency.   She has been on observation since that time and feeling fairly well except she does have significant COPD for which she is on supplemental oxygen. She follows closely with pulmonology.    She had a PET scan which showed evidence of disease progression with multiple bilateral solid and subsolid nodules.   Due to the patient's history of intolerance to chemotherapy she was started on second line targeted treatment with Krazati  600 mg twice daily.  Her first dose was on 02/20/22. Dose was reduced to 400 mg twice daily around 03/03/2022 due to ongoing symptoms of nausea, vomiting, and diarrhea. She resumed this on 05/07/22.   The patient was seen with Dr. Sherrod today.  Dr. Sherrod personally and independently reviewed the scan and discussed results with the patient today.  The scan showed ***.  Dr. Sherrod recommends ***   Labs were reviewed. Recommend that she continue on the same treatment at the same dose.    Chronic itching and dry skin -Generalized itching and dry skin.  - Take antihistamines like Claritin  or Zyrtec for generalized itching. - She does have elevated eosinophils. She is going to talk to her PCP soon to get their input -Use air purifiers at home. - Apply cortisone cream to affected areas.   Iron deficiency anemia Mild anemia noted. Plans to restart iron supplementation. - Restart iron supplements as tolerated. - Monitor  hemoglobin levels.   Chronic hypoxemic respiratory failure Chronic hypoxemic respiratory failure managed with home oxygen therapy. Oxygen levels fluctuate, especially at night. - Continue home oxygen therapy at 3-4 L/min. - She will continue to follow with pulmonary medicine. She is compliant with inhalers.      Use Zofran  sublingual as needed for nausea  The patient was advised to call immediately if she has any concerning symptoms in the interval. The patient voices understanding of current disease status and treatment options and is in agreement with the current care plan. All questions were answered. The patient knows to call the clinic with any problems, questions or concerns. We can certainly see the patient much sooner if necessary  No orders of the defined types were placed in this encounter.    I spent {CHL ONC TIME VISIT - DTPQU:[id number]} counseling the patient face to face. The total time spent in the appointment was {CHL ONC TIME VISIT - DTPQU:[id number]}.  Kord Monette L Donye Dauenhauer, PA-C 02/04/24

## 2024-02-07 ENCOUNTER — Encounter

## 2024-02-07 ENCOUNTER — Other Ambulatory Visit: Payer: Self-pay | Admitting: Pulmonary Disease

## 2024-02-08 ENCOUNTER — Inpatient Hospital Stay: Admitting: Physician Assistant

## 2024-02-08 ENCOUNTER — Telehealth: Payer: Self-pay

## 2024-02-08 NOTE — Telephone Encounter (Signed)
 Called patient about missed appt today.  She states that she called the scheduling department and LVM to cancel appt.  Rescheduled patients appt to 02/28/24 @ 330 with Cassie, PA.  Informed her to call with any concerns or questions.  She voiced understanding.

## 2024-02-20 NOTE — Progress Notes (Signed)
 [facility] OFFICE PROGRESS NOTE  [name], NP [address] [place] [address]  DIAGNOSIS: Stage IV (T1a, N0, M1 a) non-small cell lung cancer, adenocarcinoma presented with multifocal disease in both lungs including 3 nodules in the left lung as well as 2 nodules in the right lung diagnosed in December 2019.     Molecular studies showed: KRAS G12C BCOR N1425S-subclonal RAD21 amplification   PDL 1 expression 1%  PRIOR THERAPY:  1) Systemic chemotherapy with carboplatin  for AUC of 5, Alimta  500 mg/M2 and Keytruda  200 mg IV every 3 weeks.  First dose April 12, 2018. Status post 4 cycles. starting from cycle #3, Keytruda  was dropped and patient received treatment with Carboplatin  for an AUC of 5 and Alimta  500 mg/m2 IV every 3 weeks.  2) Systemic chemotherapy with Alimta  500 mg/m2 IV every 3 weeks. Status post 3 cycles of single agent Alimta . Most recent dose given on 08/23/2018.  The treatment is currently on hold secondary to renal insufficiency  CURRENT THERAPY: Krazati  600 mg BID, first dose on 02/20/22. Dose reduced to 400 mg BID starting on 03/10/22. Her treatment was on hold from 03/15/22-05/07/22.     INTERVAL HISTORY: [name] 68 y.o. female returns to the clinic today for a follow-up visit. The patient was last seen in clinic by 12/14/23.   The patient is currently on a reduced dose of treatment with Krazati . She tolerates this well without any adverse side effects.    In general the patient does have a lot of unusual symptoms and side effects that have been going on predating Krazati  and poor overall health due to her COPD.   At her last appointment she was reporting intermittent angina. She was encouraged to follow up cardiology as she was overdue. She did follow up and underwent heart cath in November. She tells me that one of her vessels was 99 and 98% blocked. She has intermittent tachycardia and she takes metoprolol . She took this about an  hour or so ago. She has a follow up on 03/16/24 with cardiology. She is supposed to start repatha  but she is worried about the side effect profile. She denies chest pain or arm numbness.  She reports persistent dry mouth and dehydration despite increased oral fluid intake, as well as generalized xerosis.   She is uncertain if these symptoms are medication-related. She also describes chronic right shoulder pain, which has progressively worsened.  She has longstanding balance difficulties, intermittent bladder symptoms, episodic unsteady gait, and near-falls, particularly when bending forward. She also notes cognitive impairment, including memory deficits and difficulty with conversation. Neurology previously considered multiple sclerosis, and prior imaging demonstrated small white matter changes. She was supposed to follow up with them but never did.    The diarrhea started after having her gallbladder removed.  She has not been reporting any unusual diarrhea recently. She did not report any unusual nausea. She uses sublingual zofran .   Due to her severe COPD, she is not as active. If she moves around, she has tachycardia even with minimal exertion. She is on supplemental oxygen at baseline 3-4 L. She recently saw Dr. Theophilus.    She recently had a restaging CT scan.  She is here today for evaluation and to review her scan results.  MEDICAL HISTORY: Past Medical History:  Diagnosis Date   Anemia    long time ago (12/06/2012)   Anxiety    Arthritis    right knee real bad; in  my hands bad (12/06/2012)   Asthma    Celiac disease    Colon polyps    COPD (chronic obstructive pulmonary disease) (HCC)    Coronary artery disease    a. s/p Xience DES to [facility] 04/2008;  b. LHC 05/2008: Proximal RCA 25%, mid RCA stent patent.  c. Anormal nuc 2014 -> s/p LHC with severe mRCA stenosis s/p DES. d. Cath 04/2013 s/p DES to LAD.  e. LHC 08/2015 was stable.   Depression    years ago (12/06/2012)    Fibromyalgia    Gallstones    GERD (gastroesophageal reflux disease)    H/O hiatal hernia    Hepatitis    not A, B, or C (12/06/2012)   Hyperlipidemia    intol to statins and other chol agents due to elevated LFTs   Hypertension    Hypothyroidism    IBS (irritable bowel syndrome)    Interstitial cystitis    Lung cancer (HCC) dx'd 01/2018   Lung nodules    Bilateral   Migraines    when I was younger (12/06/2012)   Pneumonia    PONV (postoperative nausea and vomiting)    Very bad   Premature atrial contractions    PVC's (premature ventricular contractions)    Sinus headache    SVT (supraventricular tachycardia)     ALLERGIES:  is allergic to ciprofloxacin, levofloxacin, statins, tricor  [fenofibrate ], clarithromycin, compazine  [prochlorperazine ], latex, sulfamethoxazole-trimethoprim, and codeine.  MEDICATIONS:  Current Outpatient Medications  Medication Sig Dispense Refill   adagrasib  (KRAZATI ) 200 MG tablet Take 2 tablets (400 mg total) by mouth 2 (two) times daily. 180 tablet 3   albuterol  (PROVENTIL ) (5 MG/ML) 0.5% nebulizer solution Take 2.5 mg by nebulization every 6 (six) hours as needed for wheezing.     albuterol  (VENTOLIN  HFA) 108 (90 Base) MCG/ACT inhaler Inhale 1 puff into the lungs every 6 (six) hours as needed (wheezing/shortness of breath.). 1 each 5   Artificial Tear Ointment (DRY EYES OP) Place 1 drop into both eyes daily as needed (dry eyes).     aspirin  EC 81 MG tablet Take 1 tablet (81 mg total) by mouth daily. Swallow whole. 150 tablet 2   BELSOMRA  20 MG TABS Take 1 tablet by mouth at bedtime as needed.     BREZTRI  AEROSPHERE 160-9-4.8 MCG/ACT AERO inhaler INHALE 2 PUFFS INTO THE LUNGS IN THE MORNING AND AT BEDTIME 10.7 g 1   buPROPion  (WELLBUTRIN  XL) 150 MG 24 hr tablet Take 1-2 tablets by mouth See admin instructions. Patient takes 2 tablets (300mg ) in the morning and 1 tablet (150mg ) in the evening     clopidogrel  (PLAVIX ) 75 MG tablet Take 1 tablet (75  mg total) by mouth daily with breakfast. 90 tablet 3   Evolocumab  (REPATHA  SURECLICK) 140 MG/ML SOAJ Inject 140 mg into the skin every 14 (fourteen) days. 2 mL 11   ferrous sulfate  (FEROSUL) 325 (65 FE) MG tablet TAKE 1 TABLET(325 MG) BY MOUTH DAILY WITH BREAKFAST 30 tablet 2   HYDROcodone -acetaminophen  (NORCO) 7.5-325 MG tablet Take 1 tablet by mouth every 6 (six) hours as needed.     levothyroxine  (SYNTHROID ) 50 MCG tablet Take 50 mcg by mouth every morning.     LORazepam  (ATIVAN ) 2 MG tablet Take 2 mg by mouth every evening. And as needed     metoprolol  tartrate (LOPRESSOR ) 25 MG tablet Take 0.5 tablets (12.5 mg total) by mouth 2 (two) times daily. (Patient taking differently: Take 25 mg by mouth 2 (two) times daily.)  90 tablet 3   montelukast  (SINGULAIR ) 10 MG tablet Take 1 tablet (10 mg total) by mouth at bedtime. 30 tablet 11   nitroGLYCERIN  (NITROSTAT ) 0.4 MG SL tablet Place 1 tablet (0.4 mg total) under the tongue every 5 (five) minutes as needed for chest pain. 25 tablet 12   ondansetron  (ZOFRAN -ODT) 8 MG disintegrating tablet Take 1 tablet (8 mg total) by mouth every 8 (eight) hours as needed for nausea or vomiting. 30 tablet 2   traZODone  (DESYREL ) 50 MG tablet Take 50 mg by mouth at bedtime.     No current facility-administered medications for this visit.    SURGICAL HISTORY:  Past Surgical History:  Procedure Laterality Date   CARDIAC CATHETERIZATION  04/2008; 05/2008   CARDIAC CATHETERIZATION N/A 08/07/2015   Procedure: Left Heart Cath and Coronary Angiography;  Surgeon: Ozell Fell, MD;  Location: [facility] INVASIVE CV LAB;  Service: Cardiovascular;  Laterality: N/A;   CHOLECYSTECTOMY     CORONARY ANGIOPLASTY WITH STENT PLACEMENT  04/2008 12/06/2012   1 + 1 (12/06/2012)   CORONARY STENT INTERVENTION N/A 01/21/2024   Procedure: CORONARY STENT INTERVENTION;  Surgeon: Verlin Lonni BIRCH, MD;  Location: [facility] INVASIVE CV LAB;  Service: Cardiovascular;  Laterality: N/A;   CORONARY STENT  PLACEMENT  05/08/2013   DES TO LAD      DR MCALHANY   IR IMAGING GUIDED PORT INSERTION  04/29/2018   LEFT HEART CATH AND CORONARY ANGIOGRAPHY N/A 01/21/2024   Procedure: LEFT HEART CATH AND CORONARY ANGIOGRAPHY;  Surgeon: Verlin Lonni BIRCH, MD;  Location: [facility] INVASIVE CV LAB;  Service: Cardiovascular;  Laterality: N/A;   LEFT HEART CATHETERIZATION WITH CORONARY ANGIOGRAM N/A 12/06/2012   Procedure: LEFT HEART CATHETERIZATION WITH CORONARY ANGIOGRAM;  Surgeon: Lonni BIRCH Verlin, MD;  Location: [facility] CATH LAB;  Service: Cardiovascular;  Laterality: N/A;   LEFT HEART CATHETERIZATION WITH CORONARY ANGIOGRAM N/A 05/08/2013   Procedure: LEFT HEART CATHETERIZATION WITH CORONARY ANGIOGRAM;  Surgeon: Lonni BIRCH Verlin, MD;  Location: [facility] CATH LAB;  Service: Cardiovascular;  Laterality: N/A;   LEFT HEART CATHETERIZATION WITH CORONARY ANGIOGRAM N/A 01/04/2014   Procedure: LEFT HEART CATHETERIZATION WITH CORONARY ANGIOGRAM;  Surgeon: Lonni BIRCH Verlin, MD;  Location: [facility] CATH LAB;  Service: Cardiovascular;  Laterality: N/A;   PERCUTANEOUS CORONARY STENT INTERVENTION (PCI-S)  12/06/2012   Procedure: PERCUTANEOUS CORONARY STENT INTERVENTION (PCI-S);  Surgeon: Lonni BIRCH Verlin, MD;  Location: [facility] CATH LAB;  Service: Cardiovascular;;   PERCUTANEOUS CORONARY STENT INTERVENTION (PCI-S)  05/08/2013   Procedure: PERCUTANEOUS CORONARY STENT INTERVENTION (PCI-S);  Surgeon: Lonni BIRCH Verlin, MD;  Location: [facility] CATH LAB;  Service: Cardiovascular;;  mid LAD    TUBAL LIGATION     VAGINAL HYSTERECTOMY     VIDEO BRONCHOSCOPY WITH ENDOBRONCHIAL NAVIGATION N/A 02/17/2018   Procedure: VIDEO BRONCHOSCOPY WITH ENDOBRONCHIAL NAVIGATION;  Surgeon: Kerrin Elspeth BROCKS, MD;  Location: [facility] OR;  Service: Thoracic;  Laterality: N/A;    REVIEW OF SYSTEMS:   Constitutional: Positive for stable fatigue. Negative for chills and fever.  HENT: Negative for mouth sores, nosebleeds, sore throat and trouble swallowing.    Eyes: Negative for eye problems and icterus.  Respiratory: Positive for dyspnea on exertion. Negative for hemoptysis and wheezing.   Cardiovascular: No chest pain. Negative for leg swelling. Positive for intermittent palpitations.  Gastrointestinal: Negative for abdominal pain.  Genitourinary:  Negative for bladder incontinence and hematuria.  Musculoskeletal: Negative for gait problem, neck pain and neck stiffness.  Skin:  Negative for rash.  Neurological: Occasional lightheadedness. Negative for extremity  weakness and seizures.  Hematological: Negative for adenopathy. Does not bruise/bleed easily.  Psychiatric/Behavioral: Negative for confusion, depression and sleep disturbance. The patient is not nervous/anxious.     PHYSICAL EXAMINATION:  Blood pressure 121/76, pulse 98, temperature 97.7 F (36.5 C), resp. rate 18, SpO2 99%.  ECOG PERFORMANCE STATUS: 2-3  Physical Exam  Constitutional: Oriented to person, place, and time and chronically ill-appearing female and in no distress.  HENT:  Head: Normocephalic and atraumatic.  Mouth/Throat: Oropharynx is clear and moist. No oropharyngeal exudate.  Eyes: Conjunctivae are normal. Right eye exhibits no discharge. Left eye exhibits no discharge. No scleral icterus.  Neck: Normal range of motion. Neck supple Cardiovascular: Normal rate, regular rhythm, normal heart sounds and intact distal pulses.   Pulmonary/Chest: Effort normal. Quiet breath sounds bilaterally.  On supplemental oxygen.  No respiratory distress. No wheezes. No rales.  Abdominal: Soft. Bowel sounds are normal. Exhibits no distension and no mass. There is no tenderness.  Musculoskeletal: Normal range of motion. Exhibits no edema.  Lymphadenopathy:    No cervical adenopathy.  Neurological: Alert and oriented to person, place, and time. Exhibits muscle wasting.  Skin: Skin is warm and dry. SNo rash noted. Not diaphoretic.  No pallor.  Psychiatric: Mood, memory and judgment  normal.  Vitals reviewed.    LABORATORY DATA: Lab Results  Component Value Date   WBC 7.3 02/01/2024   HGB 9.9 (L) 02/01/2024   HCT 30.2 (L) 02/01/2024   MCV 94.7 02/01/2024   PLT 281 02/01/2024      Chemistry      Component Value Date/Time   NA 140 02/01/2024 1454   NA 140 01/14/2024 1406   K 4.9 02/01/2024 1454   CL 103 02/01/2024 1454   CO2 29 02/01/2024 1454   BUN 21 02/01/2024 1454   BUN 19 01/14/2024 1406   CREATININE 1.55 (H) 02/01/2024 1454   CREATININE 0.68 08/05/2015 1456      Component Value Date/Time   CALCIUM  9.6 02/01/2024 1454   ALKPHOS 82 02/01/2024 1454   AST 18 02/01/2024 1454   ALT 12 02/01/2024 1454   BILITOT 0.7 02/01/2024 1454       RADIOGRAPHIC STUDIES:  CT CHEST ABDOMEN PELVIS WO CONTRAST Result Date: 02/07/2024 EXAM: CT CHEST, ABDOMEN AND PELVIS WITHOUT CONTRAST 02/01/2024 04:40:40 PM TECHNIQUE: CT of the chest, abdomen and pelvis was performed without the administration of intravenous contrast. Multiplanar reformatted images are provided for review. Automated exposure control, iterative reconstruction, and/or weight based adjustment of the mA/kV was utilized to reduce the radiation dose to as low as reasonably achievable. COMPARISON: 09/15/2023 CLINICAL HISTORY: Metastatic disease evaluation; Non-small cell lung cancer (NSCLC), metastatic, assess treatment response. * Tracking Code: BO * FINDINGS: CHEST: MEDIASTINUM AND LYMPH NODES: Heart and pericardium: Normal heart size, without pericardial effusion. Central airways: The central airways are clear. Lymphadenopathy: No mediastinal, hilar, axillary, or supraclavicular lymphadenopathy. Vascular: Normal aortic caliber. Port-a-cath tip superior caval/atrial junction. LAD and right coronary artery calcification. LUNGS AND PLEURA: Mixed attenuation, primarily solid posterior right upper lobe pulmonary nodule measures 2.1 x 1.4 cm on image 40 / 4 and is similar to on the prior exam when re-measured.  Superior segment left lower lobe irregular nodular densities x 2 including at 1.4 cm on image 44 / 4 and 1.1 cm on image 32 / 4 are similar to the prior exam when re-measured. Bilateral upper lobe predominant ground-glass nodules are again identified. The largest is in the left upper lobe at 2.0 cm on image  34 / 4. Moderate centrilobular emphysema. No focal consolidation or pulmonary edema. No pleural effusion or pneumothorax. ABDOMEN AND PELVIS: LIVER: Normal, without mass or intrahepatic biliary duct dilatation. GALLBLADDER AND BILE DUCTS: Cholecystectomy. No biliary ductal dilatation. SPLEEN: Normal in size and morphology. PANCREAS: Normal, without duct dilatation or acute inflammation. ADRENAL GLANDS: No acute abnormality. KIDNEYS, URETERS AND BLADDER: No renal mass or hydronephrosis. Mild, left greater than right renal cortical thinning. No renal, ureteric, or bladder calculi. GI AND BOWEL: Normal stomach, without wall thickening. Normal small bowel caliber. Normal colon and terminal ileum. Normal appendix. There is no bowel obstruction. REPRODUCTIVE ORGANS: Hysterectomy. moderate pelvic floor laxity. Suspect surgical sutures in the deep right pelvis on image 122 / 2, similar. PERITONEUM AND RETROPERITONEUM: No ascites. No free air. There is new small volume complex fluid adjacent to the right femoral vessels on image 109 / 2 and in the right upper pelvic extraperitoneal space on image 90 / 2. VASCULATURE: Aorta is normal in caliber. Aortic atherosclerosis. ABDOMINAL AND PELVIS LYMPH NODES: No lymphadenopathy. BONES AND SOFT TISSUES: Sclerotic lesion within the right acetabulum is similar back to 2010 and considered benign. T5 moderate compression deformity is similar without ventral canal encroachment. Disc bulges at L4-L5, L3-L4, and L5-S1. Degenerative disc disease at the lumbosacral junction. No focal soft tissue abnormality. IMPRESSION: 1. Similar appearance of multiple solid and mixed attenuation  pulmonary nodules, likely multifocal adenocarcinoma and/or metastasis. 2. No thoracic nodal or distant metastasis. 3. New small volume complex fluid/hemorrhage anterior to the right femoral vessels and within the extraperitoneal right pelvis, likely related to left heart catheterization on 11 / 21 / 25 . 4. Aortic atherosclerosis (icd10-i70.0), coronary artery atherosclerosis, and emphysema (icd10-j43.9). Electronically signed by: Rockey Kilts MD 02/07/2024 05:34 PM EST RP Workstation: [id number]     ASSESSMENT/PLAN:  This is a very pleasant 68 year old Caucasian female diagnosed with stage IV non-small cell lung cancer, adenocarcinoma. She is negative for any actionable mutations and her PD-L1 expression is 1%. She was diagnosed in December 2019. She presented with bilateral pulmonary nodules.    She initially started treatment with carboplatin , Alimta , and Keytruda . She was status post 4 cycles but Keytruda  was discontinued after cycle #2 due to his significant skin rash. She then was treated with maintenance single agent Alimta  for 3 cycles. This has been on hold since June 2020 due to worsening renal insufficiency.   She has been on observation since that time and feeling fairly well except she does have significant COPD for which she is on supplemental oxygen. She follows closely with pulmonology.    She had a PET scan which showed evidence of disease progression with multiple bilateral solid and subsolid nodules.   Due to the patient's history of intolerance to chemotherapy she was started on second line targeted treatment with Krazati  600 mg twice daily.  Her first dose was on 02/20/22. Dose was reduced to 400 mg twice daily around 03/03/2022 due to ongoing symptoms of nausea, vomiting, and diarrhea. She resumed this on 05/07/22.   Dr. Sherrod reviewed her scan prior to her appointment which did not show disease progression.    Labs were reviewed. Recommend that she continue on the same treatment  at the same dose.   Her pulse was elevated initially at 113 bmp. She had EKG which showed normal sinus rhythm with pulse of 98. She has a pulse oximeter at home. She has an upcoming appointment with cardiology. If she ever has significant tachycardia and is symptomatic, she  was advised to seek ER evaluation. Of course, she understands if she has symptoms of MI to seek ER evaluation. I did encourage her to discuss the repatha  with them as that likely will help reduce the risks associated with hydrocholesterolemia.   Chronic hypoxemic respiratory failure Chronic hypoxemic respiratory failure managed with home oxygen therapy. Oxygen levels fluctuate, especially at night. - Continue home oxygen therapy at 3-4 L/min. - She will continue to follow with pulmonary medicine. She is compliant with inhalers.      Use Zofran  sublingual as needed for nausea  We will see her for labs and repeat lab work in 1 month   The patient was advised to call immediately if she has any concerning symptoms in the interval. The patient voices understanding of current disease status and treatment options and is in agreement with the current care plan. All questions were answered. The patient knows to call the clinic with any problems, questions or concerns. We can certainly see the patient much sooner if necessary  No orders of the defined types were placed in this encounter.    The total time spent in the appointment was 30-39 minutes  Issiah Huffaker L Terrell Shimko, PA-C 02/28/2024

## 2024-02-22 ENCOUNTER — Other Ambulatory Visit: Payer: Self-pay

## 2024-02-28 ENCOUNTER — Inpatient Hospital Stay: Admitting: Physician Assistant

## 2024-02-28 ENCOUNTER — Other Ambulatory Visit (HOSPITAL_COMMUNITY): Payer: Self-pay

## 2024-02-28 VITALS — BP 121/76 | HR 98 | Temp 97.7°F | Resp 18

## 2024-02-28 DIAGNOSIS — C3492 Malignant neoplasm of unspecified part of left bronchus or lung: Secondary | ICD-10-CM

## 2024-02-29 ENCOUNTER — Telehealth: Payer: Self-pay | Admitting: Internal Medicine

## 2024-02-29 NOTE — Telephone Encounter (Signed)
 Scheduled patient for next appointment. Called and left a voicemail with the details.

## 2024-03-01 ENCOUNTER — Other Ambulatory Visit: Payer: Self-pay

## 2024-03-01 NOTE — Progress Notes (Signed)
 Specialty Pharmacy Refill Coordination Note  [name] is a 68 y.o. female contacted today regarding refills of specialty medication(s) Adagrasib  (Krazati )   Patient requested Delivery   Delivery date: 03/06/24   Verified address: [address]   [name]  [phone]   Medication will be filled on: 03/03/24

## 2024-03-16 ENCOUNTER — Ambulatory Visit: Admitting: Pharmacist Clinician (PhC)/ Clinical Pharmacy Specialist

## 2024-03-16 NOTE — Progress Notes (Unsigned)
 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 female patient of Dr Cash, in the office today to discuss options for cholesterol management.  She was previously approved for Repatha  in December and has picked up the first month supply.  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 [] Yes [] No  Do you ever skip doses due to side effects? [] Yes [] No  Do you have trouble affording your medicines? [] Yes [] No  Are you ever unable to pick up your medication due to transportation difficulties? [] Yes [] No  Do you ever stop taking your medications because you don't believe they are helping? [] Yes [] No  Do you check your weight daily? [] Yes [] No   Adherence strategy: ***  Barriers to obtaining medications: ***     Accessory Clinical Findings   Lab Results  Component Value Date   CHOL 329 (H) 01/20/2024   HDL 50 01/20/2024   LDLCALC 219 (H) 01/20/2024   LDLDIRECT 198 (H) 02/01/2018   TRIG 291 (H) 01/20/2024   CHOLHDL 6.6 (H)  01/20/2024    No results found for: LIPOA  Lab Results  Component Value Date   ALT 12 02/01/2024   AST 18 02/01/2024   ALKPHOS 82 02/01/2024   BILITOT 0.7 02/01/2024   Lab Results  Component Value Date   CREATININE 1.55 (H) 02/01/2024   BUN 21 02/01/2024   NA 140 02/01/2024   K 4.9 02/01/2024   CL 103 02/01/2024   CO2 29 02/01/2024   Lab Results  Component Value Date   HGBA1C 6.3 (H) 04/11/2022    Home Medications    Current Outpatient Medications  Medication Sig Dispense Refill   adagrasib  (KRAZATI ) 200 MG tablet Take 2 tablets (400 mg total) by mouth 2 (two) times daily. 180 tablet 3   albuterol  (PROVENTIL ) (5 MG/ML) 0.5% nebulizer solution Take 2.5 mg by nebulization every 6 (six) hours as needed for wheezing.     albuterol  (VENTOLIN  HFA) 108 (90 Base) MCG/ACT inhaler Inhale 1 puff into the lungs every 6 (six) hours as needed (wheezing/shortness of breath.). 1 each 5   Artificial Tear Ointment (DRY EYES OP) Place 1 drop into both eyes daily as needed (dry eyes).     aspirin  EC 81 MG tablet Take 1 tablet (81 mg total) by mouth daily. Swallow whole. 150 tablet 2   BELSOMRA  20 MG  TABS Take 1 tablet by mouth at bedtime as needed.     BREZTRI  AEROSPHERE 160-9-4.8 MCG/ACT AERO inhaler INHALE 2 PUFFS INTO THE LUNGS IN THE MORNING AND AT BEDTIME 10.7 g 1   buPROPion  (WELLBUTRIN  XL) 150 MG 24 hr tablet Take 1-2 tablets by mouth See admin instructions. Patient takes 2 tablets (300mg ) in the morning and 1 tablet (150mg ) in the evening     clopidogrel  (PLAVIX ) 75 MG tablet Take 1 tablet (75 mg total) by mouth daily with breakfast. 90 tablet 3   Evolocumab  (REPATHA  SURECLICK) 140 MG/ML SOAJ Inject 140 mg into the skin every 14 (fourteen) days. 2 mL 11   ferrous sulfate  (FEROSUL) 325 (65 FE) MG tablet TAKE 1 TABLET(325 MG) BY MOUTH DAILY WITH BREAKFAST 30 tablet 2   HYDROcodone -acetaminophen  (NORCO) 7.5-325 MG tablet Take 1 tablet by mouth every 6 (six) hours as needed.      levothyroxine  (SYNTHROID ) 50 MCG tablet Take 50 mcg by mouth every morning.     LORazepam  (ATIVAN ) 2 MG tablet Take 2 mg by mouth every evening. And as needed     metoprolol  tartrate (LOPRESSOR ) 25 MG tablet Take 0.5 tablets (12.5 mg total) by mouth 2 (two) times daily. (Patient taking differently: Take 25 mg by mouth 2 (two) times daily.) 90 tablet 3   montelukast  (SINGULAIR ) 10 MG tablet Take 1 tablet (10 mg total) by mouth at bedtime. 30 tablet 11   nitroGLYCERIN  (NITROSTAT ) 0.4 MG SL tablet Place 1 tablet (0.4 mg total) under the tongue every 5 (five) minutes as needed for chest pain. 25 tablet 12   ondansetron  (ZOFRAN -ODT) 8 MG disintegrating tablet Take 1 tablet (8 mg total) by mouth every 8 (eight) hours as needed for nausea or vomiting. 30 tablet 2   traZODone  (DESYREL ) 50 MG tablet Take 50 mg by mouth at bedtime.     No current facility-administered medications for this visit.     Assessment & Plan    No problem-specific Assessment & Plan notes found for this encounter.   Allean Mink, PharmD CPP [facility] [address]   [place], [place] [address] [phone]  03/16/2024, 12:18 PM       [1]  Allergies Allergen Reactions   Ciprofloxacin Hives   Levofloxacin Hives   Statins Other (See Comments)    Elevated liver enzymes   Tricor  [Fenofibrate ] Hives and Itching   Clarithromycin Hives and Itching   Compazine  [Prochlorperazine ] Other (See Comments)    Tardive dyskinesia   Latex Other (See Comments)    Patient states Messes with my skin.   Sulfamethoxazole-Trimethoprim Hives and Itching   Codeine Nausea And Vomiting   "

## 2024-03-21 ENCOUNTER — Encounter: Payer: Self-pay | Admitting: Pulmonary Disease

## 2024-03-21 ENCOUNTER — Encounter

## 2024-03-22 ENCOUNTER — Other Ambulatory Visit (HOSPITAL_COMMUNITY): Payer: Self-pay

## 2024-03-23 ENCOUNTER — Other Ambulatory Visit: Payer: Self-pay

## 2024-03-23 ENCOUNTER — Other Ambulatory Visit (HOSPITAL_BASED_OUTPATIENT_CLINIC_OR_DEPARTMENT_OTHER): Payer: Self-pay | Admitting: Adult Health

## 2024-03-23 NOTE — Progress Notes (Signed)
 Specialty Pharmacy Ongoing Clinical Assessment Note  [name] is a 69 y.o. female who is being followed by the specialty pharmacy service for [facility] Oncology   Patient's specialty medication(s) reviewed today: Adagrasib  (Krazati )   Missed doses in the last 4 weeks: 0   Patient/Caregiver did not have any additional questions or concerns.   Therapeutic benefit summary: Patient is achieving benefit   Adverse events/side effects summary: No adverse events/side effects   Patient's therapy is appropriate to: Continue    Goals Addressed             This Visit's Progress    Slow Disease Progression   On track    Patient is on track. Patient will maintain adherence.  Per office visit notes from 02/28/24, the recent scan showed no concerning findings for disease progression.         Follow up: 6 months  [facility]

## 2024-03-23 NOTE — Progress Notes (Signed)
 Specialty Pharmacy Refill Coordination Note  [name] is a 69 y.o. female contacted today regarding refills of specialty medication(s) Adagrasib  (Krazati )   Patient requested Delivery   Delivery date: 03/30/24   Verified address: [address]   [name] [address]   Medication will be filled on: 03/29/24

## 2024-03-29 ENCOUNTER — Encounter (HOSPITAL_COMMUNITY): Payer: Self-pay

## 2024-03-29 ENCOUNTER — Other Ambulatory Visit: Payer: Self-pay

## 2024-03-30 ENCOUNTER — Other Ambulatory Visit: Payer: Self-pay

## 2024-04-03 ENCOUNTER — Inpatient Hospital Stay: Admitting: Internal Medicine

## 2024-04-03 ENCOUNTER — Inpatient Hospital Stay

## 2024-04-05 ENCOUNTER — Inpatient Hospital Stay: Admitting: Internal Medicine

## 2024-04-05 ENCOUNTER — Inpatient Hospital Stay

## 2024-04-05 ENCOUNTER — Other Ambulatory Visit: Payer: Self-pay

## 2024-04-05 DIAGNOSIS — C3492 Malignant neoplasm of unspecified part of left bronchus or lung: Secondary | ICD-10-CM

## 2024-04-06 ENCOUNTER — Inpatient Hospital Stay: Admitting: Internal Medicine

## 2024-04-06 ENCOUNTER — Inpatient Hospital Stay: Attending: Physician Assistant

## 2024-04-06 ENCOUNTER — Telehealth: Payer: Self-pay | Admitting: Internal Medicine

## 2024-04-10 ENCOUNTER — Ambulatory Visit: Admitting: Nurse Practitioner

## 2024-04-24 ENCOUNTER — Inpatient Hospital Stay: Admitting: Physician Assistant

## 2024-04-24 ENCOUNTER — Inpatient Hospital Stay: Attending: Physician Assistant
